# Patient Record
Sex: Male | Born: 1945
Health system: Southern US, Community
[De-identification: ages and names within clinical notes are randomized; demographics above are authoritative.]

## PROBLEM LIST (undated history)

## (undated) DIAGNOSIS — E119 Type 2 diabetes mellitus without complications: Secondary | ICD-10-CM

## (undated) DIAGNOSIS — I10 Essential (primary) hypertension: Secondary | ICD-10-CM

## (undated) DIAGNOSIS — E785 Hyperlipidemia, unspecified: Secondary | ICD-10-CM

## (undated) DIAGNOSIS — I509 Heart failure, unspecified: Secondary | ICD-10-CM

## (undated) DIAGNOSIS — R0602 Shortness of breath: Secondary | ICD-10-CM

## (undated) DIAGNOSIS — I2510 Atherosclerotic heart disease of native coronary artery without angina pectoris: Secondary | ICD-10-CM

## (undated) DIAGNOSIS — D1803 Hemangioma of intra-abdominal structures: Secondary | ICD-10-CM

## (undated) DIAGNOSIS — I255 Ischemic cardiomyopathy: Secondary | ICD-10-CM

## (undated) DIAGNOSIS — K859 Acute pancreatitis without necrosis or infection, unspecified: Secondary | ICD-10-CM

## (undated) DIAGNOSIS — I5022 Chronic systolic (congestive) heart failure: Secondary | ICD-10-CM

## (undated) DIAGNOSIS — I214 Non-ST elevation (NSTEMI) myocardial infarction: Secondary | ICD-10-CM

## (undated) DIAGNOSIS — K219 Gastro-esophageal reflux disease without esophagitis: Secondary | ICD-10-CM

## (undated) DIAGNOSIS — K769 Liver disease, unspecified: Secondary | ICD-10-CM

## (undated) HISTORY — DX: Chronic systolic (congestive) heart failure: I50.22

## (undated) HISTORY — DX: Ischemic cardiomyopathy: I25.5

## (undated) HISTORY — DX: Acute pancreatitis without necrosis or infection, unspecified: K85.90

## (undated) HISTORY — DX: Hemangioma of intra-abdominal structures: D18.03

## (undated) HISTORY — DX: Liver disease, unspecified: K76.9

## (undated) HISTORY — DX: Gastro-esophageal reflux disease without esophagitis: K21.9

## (undated) HISTORY — DX: Hyperlipidemia, unspecified: E78.5

## (undated) HISTORY — DX: Essential (primary) hypertension: I10

---

## 1969-05-28 HISTORY — PX: INCISION AND DRAINAGE ABSCESS ANAL: SUR669

## 1998-02-03 ENCOUNTER — Ambulatory Visit (HOSPITAL_COMMUNITY): Admission: RE | Admit: 1998-02-03 | Discharge: 1998-02-03 | Payer: Self-pay | Admitting: *Deleted

## 1999-05-29 DIAGNOSIS — K859 Acute pancreatitis without necrosis or infection, unspecified: Secondary | ICD-10-CM

## 1999-05-29 HISTORY — DX: Acute pancreatitis without necrosis or infection, unspecified: K85.90

## 2000-05-10 ENCOUNTER — Ambulatory Visit (HOSPITAL_COMMUNITY): Admission: RE | Admit: 2000-05-10 | Discharge: 2000-05-10 | Payer: Self-pay | Admitting: *Deleted

## 2003-09-26 ENCOUNTER — Inpatient Hospital Stay (HOSPITAL_COMMUNITY): Admission: EM | Admit: 2003-09-26 | Discharge: 2003-09-30 | Payer: Self-pay | Admitting: Emergency Medicine

## 2003-10-07 ENCOUNTER — Encounter: Admission: RE | Admit: 2003-10-07 | Discharge: 2004-01-05 | Payer: Self-pay | Admitting: Family Medicine

## 2004-10-30 ENCOUNTER — Ambulatory Visit: Payer: Self-pay | Admitting: Family Medicine

## 2004-11-06 ENCOUNTER — Ambulatory Visit: Payer: Self-pay | Admitting: Family Medicine

## 2004-11-12 ENCOUNTER — Ambulatory Visit: Payer: Self-pay | Admitting: Family Medicine

## 2004-11-18 ENCOUNTER — Ambulatory Visit: Payer: Self-pay | Admitting: Family Medicine

## 2004-12-03 ENCOUNTER — Ambulatory Visit: Payer: Self-pay | Admitting: Family Medicine

## 2005-01-29 ENCOUNTER — Ambulatory Visit: Payer: Self-pay | Admitting: Family Medicine

## 2005-05-04 ENCOUNTER — Ambulatory Visit: Payer: Self-pay | Admitting: Family Medicine

## 2005-07-16 ENCOUNTER — Emergency Department (HOSPITAL_COMMUNITY): Admission: EM | Admit: 2005-07-16 | Discharge: 2005-07-16 | Payer: Self-pay | Admitting: Emergency Medicine

## 2005-08-04 ENCOUNTER — Ambulatory Visit: Payer: Self-pay | Admitting: Family Medicine

## 2005-09-01 ENCOUNTER — Ambulatory Visit: Payer: Self-pay | Admitting: Family Medicine

## 2005-09-21 ENCOUNTER — Ambulatory Visit: Payer: Self-pay | Admitting: Endocrinology

## 2005-09-30 ENCOUNTER — Ambulatory Visit: Payer: Self-pay | Admitting: Endocrinology

## 2005-10-14 ENCOUNTER — Ambulatory Visit: Payer: Self-pay | Admitting: Endocrinology

## 2005-10-28 ENCOUNTER — Ambulatory Visit: Payer: Self-pay | Admitting: Endocrinology

## 2005-12-06 ENCOUNTER — Ambulatory Visit: Payer: Self-pay | Admitting: Endocrinology

## 2005-12-20 ENCOUNTER — Ambulatory Visit: Payer: Self-pay | Admitting: Endocrinology

## 2006-01-10 ENCOUNTER — Ambulatory Visit: Payer: Self-pay | Admitting: Endocrinology

## 2006-02-09 ENCOUNTER — Ambulatory Visit: Payer: Self-pay | Admitting: Endocrinology

## 2006-12-23 ENCOUNTER — Ambulatory Visit: Payer: Self-pay | Admitting: Endocrinology

## 2007-01-13 ENCOUNTER — Ambulatory Visit: Payer: Self-pay | Admitting: Endocrinology

## 2007-02-10 ENCOUNTER — Ambulatory Visit: Payer: Self-pay | Admitting: Endocrinology

## 2007-04-24 ENCOUNTER — Encounter: Payer: Self-pay | Admitting: Endocrinology

## 2007-04-24 DIAGNOSIS — I10 Essential (primary) hypertension: Secondary | ICD-10-CM | POA: Insufficient documentation

## 2007-04-24 DIAGNOSIS — E785 Hyperlipidemia, unspecified: Secondary | ICD-10-CM | POA: Insufficient documentation

## 2007-04-24 DIAGNOSIS — Z8719 Personal history of other diseases of the digestive system: Secondary | ICD-10-CM | POA: Insufficient documentation

## 2008-12-11 ENCOUNTER — Telehealth: Payer: Self-pay | Admitting: Family Medicine

## 2009-01-27 ENCOUNTER — Ambulatory Visit: Payer: Self-pay | Admitting: Endocrinology

## 2009-01-27 LAB — CONVERTED CEMR LAB
ALT: 23 units/L (ref 0–53)
AST: 20 units/L (ref 0–37)
Albumin: 3.8 g/dL (ref 3.5–5.2)
Alkaline Phosphatase: 55 units/L (ref 39–117)
BUN: 11 mg/dL (ref 6–23)
Bilirubin, Direct: 0.2 mg/dL (ref 0.0–0.3)
CO2: 30 meq/L (ref 19–32)
Calcium: 9.4 mg/dL (ref 8.4–10.5)
Chloride: 103 meq/L (ref 96–112)
Creatinine, Ser: 0.7 mg/dL (ref 0.4–1.5)
GFR calc non Af Amer: 122.11 mL/min (ref >60)
Glucose, Bld: 160 mg/dL — ABNORMAL HIGH (ref 70–99)
Potassium: 3.7 meq/L (ref 3.5–5.1)
Sodium: 140 meq/L (ref 135–145)
TSH: 1.33 microintl units/mL (ref 0.35–5.50)
Total Bilirubin: 0.9 mg/dL (ref 0.3–1.2)
Total Protein: 6.9 g/dL (ref 6.0–8.3)

## 2009-02-13 ENCOUNTER — Ambulatory Visit: Payer: Self-pay | Admitting: Endocrinology

## 2009-03-21 ENCOUNTER — Ambulatory Visit: Payer: Self-pay | Admitting: Endocrinology

## 2009-03-21 LAB — CONVERTED CEMR LAB: Hgb A1c MFr Bld: 11.6 % — ABNORMAL HIGH (ref 4.6–6.5)

## 2009-06-27 ENCOUNTER — Ambulatory Visit: Payer: Self-pay | Admitting: Endocrinology

## 2009-07-01 ENCOUNTER — Encounter (INDEPENDENT_AMBULATORY_CARE_PROVIDER_SITE_OTHER): Payer: Self-pay | Admitting: *Deleted

## 2009-07-03 LAB — CONVERTED CEMR LAB: Hgb A1c MFr Bld: 11.7 % — ABNORMAL HIGH (ref 4.6–6.5)

## 2009-07-11 ENCOUNTER — Ambulatory Visit: Payer: Self-pay | Admitting: Endocrinology

## 2009-08-01 ENCOUNTER — Ambulatory Visit: Payer: Self-pay | Admitting: Endocrinology

## 2009-09-12 ENCOUNTER — Ambulatory Visit: Payer: Self-pay | Admitting: Endocrinology

## 2009-09-27 HISTORY — PX: CARDIAC CATHETERIZATION: SHX172

## 2009-11-21 ENCOUNTER — Inpatient Hospital Stay (HOSPITAL_COMMUNITY): Admission: EM | Admit: 2009-11-21 | Discharge: 2009-11-22 | Payer: Self-pay | Admitting: Emergency Medicine

## 2009-11-21 DIAGNOSIS — I214 Non-ST elevation (NSTEMI) myocardial infarction: Secondary | ICD-10-CM

## 2009-11-21 HISTORY — DX: Non-ST elevation (NSTEMI) myocardial infarction: I21.4

## 2009-11-27 ENCOUNTER — Encounter: Payer: Self-pay | Admitting: Endocrinology

## 2009-12-12 ENCOUNTER — Ambulatory Visit: Payer: Self-pay | Admitting: Endocrinology

## 2009-12-12 DIAGNOSIS — F419 Anxiety disorder, unspecified: Secondary | ICD-10-CM | POA: Insufficient documentation

## 2009-12-12 LAB — CONVERTED CEMR LAB: Hgb A1c MFr Bld: 10.9 % — ABNORMAL HIGH (ref 4.6–6.5)

## 2009-12-29 ENCOUNTER — Telehealth: Payer: Self-pay | Admitting: Endocrinology

## 2010-01-23 ENCOUNTER — Ambulatory Visit: Payer: Self-pay | Admitting: Endocrinology

## 2010-02-13 ENCOUNTER — Ambulatory Visit: Payer: Self-pay | Admitting: Endocrinology

## 2010-03-11 ENCOUNTER — Telehealth: Payer: Self-pay | Admitting: Endocrinology

## 2010-03-20 ENCOUNTER — Ambulatory Visit: Payer: Self-pay | Admitting: Endocrinology

## 2010-03-20 LAB — CONVERTED CEMR LAB: Hgb A1c MFr Bld: 10.9 % — ABNORMAL HIGH (ref 4.6–6.5)

## 2010-05-22 ENCOUNTER — Ambulatory Visit: Payer: Self-pay | Admitting: Endocrinology

## 2010-08-03 ENCOUNTER — Ambulatory Visit: Payer: Self-pay | Admitting: Family Medicine

## 2010-08-03 LAB — CONVERTED CEMR LAB
Blood in Urine, dipstick: NEGATIVE
Specific Gravity, Urine: >=1.03
Urobilinogen, UA: 0.2

## 2010-08-06 LAB — CONVERTED CEMR LAB
BUN: 21 mg/dL (ref 6–23)
Basophils Absolute: 0 10*3/uL (ref 0.0–0.1)
Basophils Relative: 0.4 % (ref 0.0–3.0)
Bilirubin, Direct: 0.2 mg/dL (ref 0.0–0.3)
CO2: 29 meq/L (ref 19–32)
Chloride: 99 meq/L (ref 96–112)
Cholesterol: 114 mg/dL (ref 0–200)
Creatinine, Ser: 0.8 mg/dL (ref 0.4–1.5)
Eosinophils Absolute: 0.1 10*3/uL (ref 0.0–0.7)
HDL: 28.5 mg/dL — ABNORMAL LOW (ref >39.00)
Hgb A1c MFr Bld: 10.9 % — ABNORMAL HIGH (ref 4.6–6.5)
MCHC: 34 g/dL (ref 30.0–36.0)
MCV: 94.2 fL (ref 78.0–100.0)
Microalb Creat Ratio: 5.1 mg/g (ref 0.0–30.0)
Microalb, Ur: 6.4 mg/dL — ABNORMAL HIGH (ref 0.0–1.9)
Monocytes Absolute: 0.6 10*3/uL (ref 0.1–1.0)
Neutrophils Relative %: 64.9 % (ref 43.0–77.0)
PSA: 0.41 ng/mL (ref 0.10–4.00)
Platelets: 220 10*3/uL (ref 150.0–400.0)
RDW: 13.3 % (ref 11.5–14.6)
Total Bilirubin: 1.4 mg/dL — ABNORMAL HIGH (ref 0.3–1.2)
Total Protein: 6.9 g/dL (ref 6.0–8.3)
Triglycerides: 90 mg/dL (ref 0.0–149.0)

## 2010-10-29 NOTE — Progress Notes (Signed)
Summary: Rx refill req  Phone Note Refill Request Message from:  Fax from Pharmacy on [facility] [address]   Refills Requested: Medication #1:  QUINAPRIL-HYDROCHLOROTHIAZIDE 20-12.5 MG TABS 1 qd   Dosage confirmed as above?Dosage Confirmed   Supply Requested: 3 months  Medication #2:  PLAVIX 75 MG TABS 1 tab daily   Dosage confirmed as above?Dosage Confirmed   Supply Requested: 3 months  Medication #3:  COREG 6.25 MG TABS 1 tab two times a day with meals   Dosage confirmed as above?Dosage Confirmed   Supply Requested: 3 months  Medication #4:  SIMVASTATIN 40 MG TABS 1 tab daily   Dosage confirmed as above?Dosage Confirmed   Supply Requested: 3 months Per pharmacy, Pt's Insurance now requires a 90 days supply on these medications.   Method Requested: Electronic Initial call taken by: Margaret Pyle, CMA,  March 11, 2010 9:17 AM    Prescriptions: PLAVIX 75 MG TABS (CLOPIDOGREL BISULFATE) 1 tab daily  #90 x 3   Entered by:   Margaret Pyle, CMA   Authorized by:   Minus Breeding MD   Signed by:   Margaret Pyle, CMA on 03/11/2010   Method used:   Electronically to        [facility] [phone]* (retail)       [address]       [place], [place]  [address]       Ph: [phone]       Fax: [phone]   RxID:   [id number] SIMVASTATIN 40 MG TABS (SIMVASTATIN) 1 tab daily  #90 x 3   Entered by:   Margaret Pyle, CMA   Authorized by:   Minus Breeding MD   Signed by:   Margaret Pyle, CMA on 03/11/2010   Method used:   Electronically to        [facility] [phone]* (retail)       [address]       [place], [place]  [address]       Ph: [phone]       Fax: [phone]   RxID:   [id number] COREG 6.25 MG TABS (CARVEDILOL) 1 tab two times a day with meals  #180 x 3   Entered by:   Margaret Pyle, CMA   Authorized by:   Minus Breeding MD   Signed by:   Margaret Pyle, CMA on 03/11/2010   Method used:   Electronically to        [facility] [phone]* (retail)       [address]       [place], [place]  [address]       Ph: [phone]       Fax: [phone]   RxID:   [id number] QUINAPRIL-HYDROCHLOROTHIAZIDE 20-12.5 MG TABS (QUINAPRIL-HYDROCHLOROTHIAZIDE) 1 qd  #90 x 3   Entered by:   Margaret Pyle, CMA   Authorized by:   Minus Breeding MD   Signed by:   Margaret Pyle, CMA on 03/11/2010   Method used:   Electronically to        [facility] [phone]* (retail)       [address]       [place], [place]  [address]       Ph: [phone]  Fax: [phone]   RxID:   [id number]

## 2010-10-29 NOTE — Miscellaneous (Signed)
Summary: [facility]   Imported By: Lester Wynona 12/19/2009 09:15:31  _____________________________________________________________________  External Attachment:    Type:   Image     Comment:   External Document

## 2010-10-29 NOTE — Assessment & Plan Note (Signed)
Summary: 2 wk f/u // # / cd   Vital Signs:  Patient profile:   65 year old male Height:      72 inches (182.88 cm) Weight:      211.50 pounds (96.14 kg) O2 Sat:      99 % on Room air Temp:     97.5 degrees F (36.39 degrees C) oral Pulse rate:   73 / minute BP sitting:   128 / 74  (left arm) Cuff size:   large  Vitals Entered By: Josph Macho RMA (Feb 13, 2010 3:02 PM)  O2 Flow:  Room air CC: 2 week follow up/ pt states he has to fluctuate his Humulog depending on his BS readings/ CF Is Patient Diabetic? Yes   CC:  2 week follow up/ pt states he has to fluctuate his Humulog depending on his BS readings/ CF.  History of Present Illness: no cbg record, but states cbg's are improved overall.  it was twice low before lunch, on a saturday.  he says on weekdays and weekends, it is higher in am than pm.  pt states he feels well in general.    Current Medications (verified): 1)  Multivitamins   Tabs (Multiple Vitamin) .... Take 1 By Mouth Qd 2)  Actos 45 Mg  Tabs (Pioglitazone Hcl) .... Take 1 By Mouth Qd 3)  Cod Liver Oil   Caps (Cod Liver Oil) .... Take 1 By Mouth Qd 4)  Pravachol 40 Mg  Tabs (Pravastatin Sodium) .... Qhs 5)  Bd U/f Original Pen Needle 29g X 12.42mm  Misc (Insulin Pen Needle) .... Qid 6)  Accu-Chek Aviva  Strp (Glucose Blood) .... Two Times A Day 250.01, and Lancets 7)  Quinapril-Hydrochlorothiazide 20-12.5 Mg Tabs (Quinapril-Hydrochlorothiazide) .Marland Kitchen.. 1 Qd 8)  Plavix 75 Mg Tabs (Clopidogrel Bisulfate) .Marland Kitchen.. 1 Tab Daily 9)  Coreg 6.25 Mg Tabs (Carvedilol) .Marland Kitchen.. 1 Tab Two Times A Day With Meals 10)  Simvastatin 40 Mg Tabs (Simvastatin) .Marland Kitchen.. 1 Tab Daily 11)  Humalog Mix 75/25 Kwikpen 75-25 % Susp (Insulin Lispro Prot & Lispro) .... 40 Units Am and 30 Units Pm, and Pen Needles Two Times A Day 12)  Aspirin 81mg  .... Once Daily  Allergies (verified): 1)  ! Metformin Hcl (Metformin Hcl)  Past History:  Past Medical History: Last updated: 04/24/2007 Diabetes mellitus,  type II Hyperlipidemia Hypertension Pancreatitis, hx of  Review of Systems  The patient denies syncope.    Physical Exam  General:  normal appearance.   Pulses:  dorsalis pedis intact bilat.   Extremities:  no deformity.  no ulcer on the feet.  feet are of normal color and temp.  no edema  Neurologic:  sensation is intact to touch on the feet    Impression & Recommendations:  Problem # 1:  DIABETES MELLITUS, TYPE II (ICD-250.00) he needs some adjustment in his therapy  Medications Added to Medication List This Visit: 1)  Humalog Mix 75/25 Kwikpen 75-25 % Susp (Insulin lispro prot & lispro) .... 40 units am and 30 units pm, and pen needles two times a day 2)  Aspirin 81mg   .... Once daily  Other Orders: Est. Patient Level III ([address])  Patient Instructions: 1)  change levemir to humalog 75/25, 40 units am and 30 units pm. 2)  on weekends, take 45 units am and 35 units pm 3)  check your blood sugar 3 times a day.  vary the time of day when you check, between before the 3 meals, and at bedtime.  also check if you have symptoms of your blood sugar being too high or too low.  please keep a record of the readings and bring it to your next appointment here.  please call us sooner if you are having low blood sugar episodes. 4)  Please schedule a follow-up appointment in 1 month.

## 2010-10-29 NOTE — Assessment & Plan Note (Signed)
Summary: 2 MTH FU  STC   Vital Signs:  Patient profile:   65 year old male Height:      72 inches (182.88 cm) Weight:      203.13 pounds (92.33 kg) O2 Sat:      97 % on Room air Temp:     98.3 degrees F (36.83 degrees C) oral Pulse rate:   79 / minute BP sitting:   142 / 90  (left arm) Cuff size:   large  Vitals Entered By: Josph Macho RMA (December 12, 2009 1:34 PM)  O2 Flow:  Room air CC: 2 month follow up/ pt is no longer taking Pravachol/ CF Is Patient Diabetic? Yes   CC:  2 month follow up/ pt is no longer taking Pravachol/ CF.  History of Present Illness: the status of 3 chronic medical problems is addressed today: dm:  he feels better since recent hospitalization.  it is over 200, especially on the days when he does not work.  however, it is extremely variable.  he had hypoglycemia in the afternoon, when he had taken only 15 units that am.  on further questioning, he says he is taking lantus at a varying dosage.  he says insulin dosage requirement has decreased drastically since his hospitalization last month.   anxiety persists. htn:  he is taking both quinapril and lisinopril given to him in the hospital.  Current Medications (verified): 1)  Multivitamins   Tabs (Multiple Vitamin) .... Take 1 By Mouth Qd 2)  Actos 45 Mg  Tabs (Pioglitazone Hcl) .... Take 1 By Mouth Qd 3)  Cod Liver Oil   Caps (Cod Liver Oil) .... Take 1 By Mouth Qd 4)  Pravachol 40 Mg  Tabs (Pravastatin Sodium) .... Qhs 5)  Bd U/f Original Pen Needle 29g X 12.85mm  Misc (Insulin Pen Needle) .... Qid 6)  Accu-Chek Aviva  Strp (Glucose Blood) .... Two Times A Day 250.01, and Lancets 7)  Humalog Mix 75/25 Kwikpen 75-25 % Susp (Insulin Lispro Prot & Lispro) .... 30 Units Bid 8)  Quinapril-Hydrochlorothiazide 20-12.5 Mg Tabs (Quinapril-Hydrochlorothiazide) .Marland Kitchen.. 1 Qd 9)  Plavix 75 Mg Tabs (Clopidogrel Bisulfate) .Marland Kitchen.. 1 Tab Daily 10)  Coreg 6.25 Mg Tabs (Carvedilol) .Marland Kitchen.. 1 Tab Two Times A Day With Meals 11)   Simvastatin 40 Mg Tabs (Simvastatin) .Marland Kitchen.. 1 Tab Daily  Allergies (verified): 1)  ! Metformin Hcl (Metformin Hcl)  Past History:  Past Medical History: Last updated: 04/24/2007 Diabetes mellitus, type II Hyperlipidemia Hypertension Pancreatitis, hx of  Review of Systems  The patient denies hypoglycemia.         he has lost a few lbs since last ov.  Physical Exam  General:  normal appearance.   Psych:  very anxious and talkative. Additional Exam:  Hemoglobin A1C       [H]  10.9 %    Impression & Recommendations:  Problem # 1:  DIABETES MELLITUS, TYPE II (ICD-250.00) he may do better on a simpler regimen  Problem # 2:  ANXIETY (ICD-300.00) this may be limiting the rx of #1  Problem # 3:  HYPERTENSION (ICD-401.9) he is on redundant meds  Medications Added to Medication List This Visit: 1)  Plavix 75 Mg Tabs (Clopidogrel bisulfate) .Marland Kitchen.. 1 tab daily 2)  Coreg 6.25 Mg Tabs (Carvedilol) .Marland Kitchen.. 1 tab two times a day with meals 3)  Simvastatin 40 Mg Tabs (Simvastatin) .Marland Kitchen.. 1 tab daily 4)  Levemir Flexpen 100 Unit/ml Soln (Insulin detemir) .... 40 units each am,  and pen needles for use once daily  Other Orders: TLB-A1C / Hgb A1C (Glycohemoglobin) (83036-A1C) Est. Patient Level IV ([address])  Patient Instructions: 1)  change current insulin to levemir, 40 units each am 2)  check your blood sugar 3 times a day.  vary the time of day when you check, between before the 3 meals, and at bedtime.  also check if you have symptoms of your blood sugar being too high or too low.  please keep a record of the readings and bring it to your next appointment here.  please call us sooner if you are having low blood sugar episodes. 3)  Please schedule a follow-up appointment in 2 weeks. 4)  please see dr todd for treatment of anxiety. 5)  you do not need both quinapril and lisinopril.  just continue the quinapril/hct as you have been taking. Prescriptions: LEVEMIR FLEXPEN 100 UNIT/ML SOLN  (INSULIN DETEMIR) 40 units each am, and pen needles for use once daily  #1 box x 11   Entered and Authorized by:   Minus Breeding MD   Signed by:   Minus Breeding MD on 12/12/2009   Method used:   Electronically to        [facility]  [address] # [id number]* (retail)       [address]       [place], [place]  [address]       Ph: [phone] or [phone]       Fax: [phone]   RxID:   [id number]

## 2010-10-29 NOTE — Progress Notes (Signed)
  Phone Note Refill Request Message from:  Fax from Pharmacy on December 29, 2009 1:07 PM  Refills Requested: Medication #1:  ACTOS 45 MG  TABS take 1 by mouth qd   Dosage confirmed as above?Dosage Confirmed Pharmacy indicated that Pt needs an RX for 90 days due to insurance.  Initial call taken by: Josph Macho RMA,  December 29, 2009 1:08 PM    Prescriptions: ACTOS 45 MG  TABS (PIOGLITAZONE HCL) take 1 by mouth qd  #90 x 1   Entered by:   Josph Macho RMA   Authorized by:   Minus Breeding MD   Signed by:   Josph Macho RMA on 12/29/2009   Method used:   [facility]ronically to        [facility] [phone]* (retail)       [address]       [place], [place]  [address]       Ph: [phone]       Fax: [phone]   RxID:   [id number]

## 2010-10-29 NOTE — Assessment & Plan Note (Signed)
Summary: 2 wk fu  stc  RS'D PER PT/ HASN'T GOTTEN THE MEDICINE/NWS   Vital Signs:  Patient profile:   65 year old male Height:      72 inches (182.88 cm) Weight:      203.25 pounds (92.39 kg) BMI:     27.67 O2 Sat:      97 % on Room air Temp:     98.0 degrees F (36.67 degrees C) oral Pulse rate:   71 / minute BP sitting:   130 / 78  (left arm) Cuff size:   large  Vitals Entered By: Josph Macho RMA (January 23, 2010 1:15 PM)  O2 Flow:  Room air CC: 2 week follow up/ Levemir makes pt sick went back to Lantus/ CF Is Patient Diabetic? Yes   CC:  2 week follow up/ Levemir makes pt sick went back to Lantus/ CF.  History of Present Illness: on levemir, pt had cbg's 250-400.  he took humalog 75/25, a total of 60 units/day, cbg's are still in the 200's.  he wants to go ack to the humalog 75/25.  Current Medications (verified): 1)  Multivitamins   Tabs (Multiple Vitamin) .... Take 1 By Mouth Qd 2)  Actos 45 Mg  Tabs (Pioglitazone Hcl) .... Take 1 By Mouth Qd 3)  Cod Liver Oil   Caps (Cod Liver Oil) .... Take 1 By Mouth Qd 4)  Pravachol 40 Mg  Tabs (Pravastatin Sodium) .... Qhs 5)  Bd U/f Original Pen Needle 29g X 12.84mm  Misc (Insulin Pen Needle) .... Qid 6)  Accu-Chek Aviva  Strp (Glucose Blood) .... Two Times A Day 250.01, and Lancets 7)  Quinapril-Hydrochlorothiazide 20-12.5 Mg Tabs (Quinapril-Hydrochlorothiazide) .Marland Kitchen.. 1 Qd 8)  Plavix 75 Mg Tabs (Clopidogrel Bisulfate) .Marland Kitchen.. 1 Tab Daily 9)  Coreg 6.25 Mg Tabs (Carvedilol) .Marland Kitchen.. 1 Tab Two Times A Day With Meals 10)  Simvastatin 40 Mg Tabs (Simvastatin) .Marland Kitchen.. 1 Tab Daily 11)  Levemir Flexpen 100 Unit/ml Soln (Insulin Detemir) .... 40 Units Each Am, and Pen Needles For Use Once Daily  Allergies (verified): 1)  ! Metformin Hcl (Metformin Hcl)  Past History:  Past Medical History: Last updated: 04/24/2007 Diabetes mellitus, type II Hyperlipidemia Hypertension Pancreatitis, hx of  Review of Systems  The patient denies  hypoglycemia.    Physical Exam  General:  normal appearance.   Psych:  anxious.     Impression & Recommendations:  Problem # 1:  DIABETES MELLITUS, TYPE II (ICD-250.00) whatever insulin he takes, he seems to need at least 60, and probably 70 units total per day.  Medications Added to Medication List This Visit: 1)  Humalog Mix 75/25 Kwikpen 75-25 % Susp (Insulin lispro prot & lispro) .... 40 units am and 30 units pm, and pen needles two times a day  Other Orders: Est. Patient Level III ([address])  Patient Instructions: 1)  change levemir to humalog 75/25, 40 units am and 30 units pm. 2)  on weekends, take 50 units am and 30 units pm 3)  check your blood sugar 3 times a day.  vary the time of day when you check, between before the 3 meals, and at bedtime.  also check if you have symptoms of your blood sugar being too high or too low.  please keep a record of the readings and bring it to your next appointment here.  please call us sooner if you are having low blood sugar episodes. 4)  Please schedule a follow-up appointment in 2 weeks. Prescriptions:  HUMALOG MIX 75/25 KWIKPEN 75-25 % SUSP (INSULIN LISPRO PROT & LISPRO) 40 units am and 30 units pm, and pen needles two times a day  #2 boxes x 11   Entered and Authorized by:   Minus Breeding MD   Signed by:   Minus Breeding MD on 01/23/2010   Method used:   [facility] to        [facility] [phone]* (retail)       [address]       [place], [place]  [address]       Ph: [phone]       Fax: [phone]   RxID:   [id number]

## 2010-10-29 NOTE — Progress Notes (Signed)
  Phone Note Refill Request Message from:  Fax from Pharmacy on December 29, 2009 9:00 AM  Refills Requested: Medication #1:  MULTIVITAMINS   TABS take 1 by mouth qd   Dosage confirmed as above?Dosage Confirmed  Medication #2:  BD U/F ORIGINAL PEN NEEDLE 29G X 12.7MM  MISC qid   Dosage confirmed as above?Dosage Confirmed  Medication #3:  ACTOS 45 MG  TABS take 1 by mouth qd   Dosage confirmed as above?Dosage Confirmed  Medication #4:  ACCU-CHEK AVIVA  STRP two times a day 250.01   Dosage confirmed as above?Dosage Confirmed [facility]  Initial call taken by: Josph Macho RMA,  December 29, 2009 9:01 AM    Prescriptions: ACCU-CHEK AVIVA  STRP (GLUCOSE BLOOD) two times a day 250.01, and lancets  #100 x 5   Entered by:   Josph Macho RMA   Authorized by:   Minus Breeding MD   Signed by:   Josph Macho RMA on 12/29/2009   Method used:   [facility]ronically to        [facility] [phone]* (retail)       [address]       [place], [place]  [address]       Ph: [phone]       Fax: [phone]   RxID:   [id number] BD U/F ORIGINAL PEN NEEDLE 29G X 12.7MM  MISC (INSULIN PEN NEEDLE) qid  #120 x 5   Entered by:   Josph Macho RMA   Authorized by:   Minus Breeding MD   Signed by:   Josph Macho RMA on 12/29/2009   Method used:   Electronically to        [facility] [phone]* (retail)       [address]       [place], [place]  [address]       Ph: [phone]       Fax: [phone]   RxID:   [id number] ACTOS 45 MG  TABS (PIOGLITAZONE HCL) take 1 by mouth qd  #30 x 5   Entered by:   Josph Macho RMA   Authorized by:   Minus Breeding MD   Signed by:   Josph Macho RMA on 12/29/2009   Method used:   Electronically to        [facility] [phone]* (retail)       [address]       [place], [place]  [address]       Ph: [phone]       Fax: [phone]  RxID:   [id number] MULTIVITAMINS   TABS (MULTIPLE VITAMIN) take 1 by mouth qd  #30 x 5   Entered by:   Josph Macho RMA   Authorized by:   Minus Breeding MD   Signed by:   Josph Macho RMA on 12/29/2009   Method used:   Electronically to        [facility] [phone]* (retail)       [address]       [place], [place]  [address]       Ph: [phone]       Fax: [phone]   RxID:   [id number]

## 2010-10-29 NOTE — Assessment & Plan Note (Signed)
Summary: 1 MTH FU  STC   Vital Signs:  Patient profile:   65 year old male Height:      72 inches (182.88 cm) Weight:      204 pounds (92.73 kg) BMI:     27.77 O2 Sat:      97 % on Room air Temp:     97.3 degrees F (36.28 degrees C) oral Pulse rate:   79 / minute BP sitting:   156 / 96  (left arm) Cuff size:   large  Vitals Entered By: Brenton Grills MA (March 20, 2010 3:59 PM)  O2 Flow:  Room air CC: 1 mo f/u/pt wants to discuss possible reactions to medications/aj   CC:  1 mo f/u/pt wants to discuss possible reactions to medications/aj.  History of Present Illness: the status of at least 3 ongoing medical problems is addressed today: dm:  no cbg record, but states cbg's are well-controlled.  no hypoglycemic sxs or low cbg's. polyuria:  pt says this is caused by actos.   htn:  pt says today's bp elevation is situational.    Current Medications (verified): 1)  Multivitamins   Tabs (Multiple Vitamin) .... Take 1 By Mouth Qd 2)  Actos 45 Mg  Tabs (Pioglitazone Hcl) .... Take 1 By Mouth Qd 3)  Cod Liver Oil   Caps (Cod Liver Oil) .... Take 1 By Mouth Qd 4)  Pravachol 40 Mg  Tabs (Pravastatin Sodium) .... Qhs 5)  Bd U/f Original Pen Needle 29g X 12.18mm  Misc (Insulin Pen Needle) .... Qid 6)  Accu-Chek Aviva  Strp (Glucose Blood) .... Two Times A Day 250.01, and Lancets 7)  Quinapril-Hydrochlorothiazide 20-12.5 Mg Tabs (Quinapril-Hydrochlorothiazide) .Marland Kitchen.. 1 Qd 8)  Plavix 75 Mg Tabs (Clopidogrel Bisulfate) .Marland Kitchen.. 1 Tab Daily 9)  Coreg 6.25 Mg Tabs (Carvedilol) .Marland Kitchen.. 1 Tab Two Times A Day With Meals 10)  Simvastatin 40 Mg Tabs (Simvastatin) .Marland Kitchen.. 1 Tab Daily 11)  Humalog Mix 75/25 Kwikpen 75-25 % Susp (Insulin Lispro Prot & Lispro) .... 40 Units Am and 30 Units Pm, and Pen Needles Two Times A Day 12)  Aspirin 81mg  .... Once Daily  Allergies (verified): 1)  ! Metformin Hcl (Metformin Hcl)  Past History:  Past Medical History: Last updated: 04/24/2007 Diabetes mellitus, type  II Hyperlipidemia Hypertension Pancreatitis, hx of  Review of Systems  The patient denies weight loss and weight gain.         he has anxiety  Physical Exam  General:  normal appearance.   Skin:  insulin injection sites at anterior abdomen are normal Psych:  anxious.   Additional Exam:  Hemoglobin A1C       [H]  10.9 %    Impression & Recommendations:  Problem # 1:  DIABETES MELLITUS, TYPE II (ICD-250.00)  Problem # 2:  polyuria uncertain etiology  Problem # 3:  HYPERTENSION (ICD-401.9) apparently with a situational component  Other Orders: TLB-A1C / Hgb A1C (Glycohemoglobin) (83036-A1C) Est. Patient Level III ([address])  Patient Instructions: 1)  good diet and exercise habits significanly improve the control of your diabetes.  please let me know if you wish to be referred to a dietician.  high blood sugar is very risky to your health.  you should see an eye doctor every year. 2)  controlling your blood pressure and cholesterol drastically reduces the damage diabetes does to your body.  this also applies to quitting smoking.  please discuss these with your doctor.  you should take an aspirin  every day, unless you have been advised by a doctor not to. 3)  continue humalog 75/25, 40 units am and 30 units pm. 4)  on weekends, take 45 units am and 35 units pm 5)  check your blood sugar 3 times a day.  vary the time of day when you check, between before the 3 meals, and at bedtime.  also check if you have symptoms of your blood sugar being too high or too low.  please keep a record of the readings and bring it to your next appointment here.  please call us sooner if you are having low blood sugar episodes. 6)  Please schedule a follow-up appointment in 1 month. 7)  stop actos.   8)  you are due for a physical with dr todd 9)  a blood test are being ordered for you today.  please call [phone] to hear your test results. 10)  (update: i left message on phone-tree:  rx as we  discussed)

## 2010-10-29 NOTE — Assessment & Plan Note (Signed)
Summary: 1 MTH FU---STC   Vital Signs:  Patient profile:   65 year old male Height:      72 inches (182.88 cm) Weight:      201.25 pounds (91.48 kg) BMI:     27.39 O2 Sat:      97 % on Room air Temp:     97.7 degrees F (36.50 degrees C) oral Pulse rate:   77 / minute BP sitting:   152 / 92  (left arm) Cuff size:   large  Vitals Entered By: Brenton Grills MA (May 22, 2010 4:21 PM)  O2 Flow:  Room air  History of Present Illness: no cbg record, but states cbg's continue to be extremely variable.  he continues to have trouble coordinating his insulin with meals.  he has hypoglycemia when he takes insulin, but a meal is delayed.    Current Medications (verified): 1)  Multivitamins   Tabs (Multiple Vitamin) .... Take 1 By Mouth Qd 2)  Cod Liver Oil   Caps (Cod Liver Oil) .... Take 1 By Mouth Qd 3)  Pravachol 40 Mg  Tabs (Pravastatin Sodium) .... Qhs 4)  Bd U/f Original Pen Needle 29g X 12.35mm  Misc (Insulin Pen Needle) .... Qid 5)  Accu-Chek Aviva  Strp (Glucose Blood) .... Two Times A Day 250.01, and Lancets 6)  Quinapril-Hydrochlorothiazide 20-12.5 Mg Tabs (Quinapril-Hydrochlorothiazide) .Marland Kitchen.. 1 Qd 7)  Plavix 75 Mg Tabs (Clopidogrel Bisulfate) .Marland Kitchen.. 1 Tab Daily 8)  Coreg 6.25 Mg Tabs (Carvedilol) .Marland Kitchen.. 1 Tab Two Times A Day With Meals 9)  Simvastatin 40 Mg Tabs (Simvastatin) .Marland Kitchen.. 1 Tab Daily 10)  Humalog Mix 75/25 Kwikpen 75-25 % Susp (Insulin Lispro Prot & Lispro) .... 40 Units Am and 30 Units Pm, and Pen Needles Two Times A Day 11)  Aspirin 81mg  .... Once Daily  Allergies (verified): 1)  ! Metformin Hcl (Metformin Hcl)  Past History:  Past Medical History: Last updated: 04/24/2007 Diabetes mellitus, type II Hyperlipidemia Hypertension Pancreatitis, hx of  Review of Systems  The patient denies syncope.    Physical Exam  General:  normal appearance.   Neck:  Supple without thyroid enlargement or tenderness.  Psych:  anxious.     Impression &  Recommendations:  Problem # 1:  DIABETES MELLITUS, TYPE II (ICD-250.00) he needs a simpler regimen  Medications Added to Medication List This Visit: 1)  Levemir Flexpen 100 Unit/ml Soln (Insulin detemir) .... 60 units each am, and pen needles once daily  Other Orders: Est. Patient Level III (91478)  Patient Instructions: 1)  after you finish your current insulin (few days), change to levemir, 60 units each am.   2)  check your blood sugar 3 times a day.  vary the time of day when you check, between before the 3 meals, and at bedtime.  also check if you have symptoms of your blood sugar being too high or too low.  please keep a record of the readings and bring it to your next appointment here.  please call us sooner if you are having low blood sugar episodes. 3)  Please schedule a follow-up appointment in 1-2 weeks. 4)  please make a physical appointment with dr todd. Prescriptions: LEVEMIR FLEXPEN 100 UNIT/ML SOLN (INSULIN DETEMIR) 60 units each am, and pen needles once daily  #2 boxes x 11   Entered and Authorized by:   Minus Breeding MD   Signed by:   Minus Breeding MD on 05/22/2010   Method used:  [facility]        [facility] [phone]* (retail)       [address]       [place], [place]  [address]       Ph: [phone]       Fax: [phone]   RxID:   [id number]

## 2010-10-29 NOTE — Letter (Signed)
Summary: [facility]  [facility]   Imported By: Lester Kiowa 12/04/2009 13:00:25  _____________________________________________________________________  External Attachment:    Type:   Image     Comment:   External Document

## 2010-10-29 NOTE — Assessment & Plan Note (Signed)
Summary: CPX (PT WILL COME IN FASTING) // RS   Vital Signs:  Patient profile:   65 year old male Height:      72 inches Weight:      196 pounds BMI:     26.68 Temp:     98.6 degrees F oral BP sitting:   125 / 80  (left arm)  Vitals Entered By: Kern Reap CMA Duncan Dull) (August 03, 2010 9:31 AM)  CC: cpx Is Patient Diabetic? Yes   CC:  cpx.  History of Present Illness: David Lane is a 65 year old, married male, nonsmoker, who comes in today for general physical examination with a history of underlying type I diabetes, hyperlipidemia, coronary artery disease, hypertension.  He states he was seen by the cardiologist recently because of a broken vein in his heart????????he had an angiogram by Barnet Pall.  I do not have a record of what happened.  He does not recall if he has a stent placed however, he is on Plavix.  Allergies: 1)  ! Metformin Hcl (Metformin Hcl)  Past History:  Past medical, surgical, family and social histories (including risk factors) reviewed, and no changes noted (except as noted below).  Past Medical History: Reviewed history from 04/24/2007 and no changes required. Diabetes mellitus, type II Hyperlipidemia Hypertension Pancreatitis, hx of  Family History: Reviewed history and no changes required.  Social History: Reviewed history from 01/27/2009 and no changes required. works Technical brewer single  Review of Systems      See HPI  Physical Exam  General:  Well-developed,well-nourished,in no acute distress; alert,appropriate and cooperative throughout examination Head:  Normocephalic and atraumatic without obvious abnormalities. No apparent alopecia or balding. Eyes:  No corneal or conjunctival inflammation noted. EOMI. Perrla. Funduscopic exam benign, without hemorrhages, exudates or papilledema. Vision grossly normal. Ears:  External ear exam shows no significant lesions or deformities.  Otoscopic examination reveals clear canals, tympanic  membranes are intact bilaterally without bulging, retraction, inflammation or discharge. Hearing is grossly normal bilaterally. Nose:  External nasal examination shows no deformity or inflammation. Nasal mucosa are pink and moist without lesions or exudates. Mouth:  Oral mucosa and oropharynx without lesions or exudates.  Teeth in good repair. Neck:  No deformities, masses, or tenderness noted. Chest Wall:  No deformities, masses, tenderness or gynecomastia noted. Breasts:  No masses or gynecomastia noted Lungs:  Normal respiratory effort, chest expands symmetrically. Lungs are clear to auscultation, no crackles or wheezes. Heart:  Normal rate and regular rhythm. S1 and S2 normal without gallop, murmur, click, rub or other extra sounds. Abdomen:  Bowel sounds positive,abdomen soft and non-tender without masses, organomegaly or hernias noted. Rectal:  No external abnormalities noted. Normal sphincter tone. No rectal masses or tenderness. Genitalia:  Testes bilaterally descended without nodularity, tenderness or masses. No scrotal masses or lesions. No penis lesions or urethral discharge. Prostate:  Prostate gland firm and smooth, no enlargement, nodularity, tenderness, mass, asymmetry or induration. Msk:  No deformity or scoliosis noted of thoracic or lumbar spine.   Pulses:  R and L carotid,radial,femoral,dorsalis pedis and posterior tibial pulses are full and equal bilaterally Extremities:  No clubbing, cyanosis, edema, or deformity noted with normal full range of motion of all joints.   Neurologic:  No cranial nerve deficits noted. Station and gait are normal. Plantar reflexes are down-going bilaterally. DTRs are symmetrical throughout. Sensory, motor and coordinative functions appear intact. Skin:  Intact without suspicious lesions or rashes Cervical Nodes:  No lymphadenopathy noted Axillary Nodes:  No  palpable lymphadenopathy Inguinal Nodes:  No significant adenopathy Psych:  Cognition and  judgment appear intact. Alert and cooperative with normal attention span and concentration. No apparent delusions, illusions, hallucinations  Diabetes Management Exam:    Foot Exam (with socks and/or shoes not present):       Sensory-Pinprick/Light touch:          Left medial foot (L-4): normal          Left dorsal foot (L-5): normal          Left lateral foot (S-1): normal          Right medial foot (L-4): normal          Right dorsal foot (L-5): normal          Right lateral foot (S-1): normal       Sensory-Monofilament:          Left foot: normal          Right foot: normal       Inspection:          Left foot: normal          Right foot: normal       Nails:          Left foot: normal          Right foot: normal    Eye Exam:       Eye Exam done here today          Results: normal   Impression & Recommendations:  Problem # 1:  HYPERTENSION (ICD-401.9) Assessment Improved  His updated medication list for this problem includes:    Quinapril-hydrochlorothiazide 20-12.5 Mg Tabs (Quinapril-hydrochlorothiazide) .Marland Kitchen... 1 qd    Coreg 6.25 Mg Tabs (Carvedilol) .Marland Kitchen... 1 tab two times a day with meals  Orders: Venipuncture (29562) TLB-Lipid Panel (80061-LIPID) TLB-BMP (Basic Metabolic Panel-BMET) (80048-METABOL) TLB-CBC Platelet - w/Differential (85025-CBCD) TLB-Hepatic/Liver Function Pnl (80076-HEPATIC) TLB-TSH (Thyroid Stimulating Hormone) (84443-TSH) TLB-A1C / Hgb A1C (Glycohemoglobin) (83036-A1C) TLB-PSA (Prostate Specific Antigen) (84153-PSA) TLB-Microalbumin/Creat Ratio, Urine (82043-MALB) Prescription Created Electronically (Z3086) UA Dipstick w/Micro (automated) (81001)  Problem # 2:  HYPERLIPIDEMIA (ICD-272.4) Assessment: Improved  The following medications were removed from the medication list:    Pravachol 40 Mg Tabs (Pravastatin sodium) ..... Qhs His updated medication list for this problem includes:    Simvastatin 40 Mg Tabs (Simvastatin) .Marland Kitchen... 1 tab  daily  Orders: Venipuncture (57846) TLB-Lipid Panel (80061-LIPID) TLB-BMP (Basic Metabolic Panel-BMET) (80048-METABOL) TLB-CBC Platelet - w/Differential (85025-CBCD) TLB-Hepatic/Liver Function Pnl (80076-HEPATIC) TLB-TSH (Thyroid Stimulating Hormone) (84443-TSH) TLB-A1C / Hgb A1C (Glycohemoglobin) (83036-A1C) TLB-PSA (Prostate Specific Antigen) (84153-PSA) TLB-Microalbumin/Creat Ratio, Urine (82043-MALB) Prescription Created Electronically (N6295) UA Dipstick w/Micro (automated) (81001)  Problem # 3:  DIABETES MELLITUS, TYPE II (ICD-250.00) Assessment: Improved  His updated medication list for this problem includes:    Quinapril-hydrochlorothiazide 20-12.5 Mg Tabs (Quinapril-hydrochlorothiazide) .Marland Kitchen... 1 qd    Levemir Flexpen 100 Unit/ml Soln (Insulin detemir) .Marland KitchenMarland KitchenMarland KitchenMarland Kitchen 60 units each am, and pen needles once daily  Orders: Venipuncture (28413) TLB-Lipid Panel (80061-LIPID) TLB-BMP (Basic Metabolic Panel-BMET) (80048-METABOL) TLB-CBC Platelet - w/Differential (85025-CBCD) TLB-Hepatic/Liver Function Pnl (80076-HEPATIC) TLB-TSH (Thyroid Stimulating Hormone) (84443-TSH) TLB-A1C / Hgb A1C (Glycohemoglobin) (83036-A1C) TLB-PSA (Prostate Specific Antigen) (84153-PSA) TLB-Microalbumin/Creat Ratio, Urine (82043-MALB) Prescription Created Electronically (K4401) UA Dipstick w/Micro (automated) (81001)  Problem # 4:  Preventive Health Care (ICD-V70.0) Assessment: Unchanged  Complete Medication List: 1)  Multivitamins Tabs (Multiple vitamin) .... Take 1 by mouth qd 2)  Bd U/f Original Pen Needle 29g X 12.55mm Misc (Insulin  pen needle) .... Qid 3)  Accu-chek Aviva Strp (Glucose blood) .... Four times a day 250.01, and lancets 4)  Quinapril-hydrochlorothiazide 20-12.5 Mg Tabs (Quinapril-hydrochlorothiazide) .Marland Kitchen.. 1 qd 5)  Plavix 75 Mg Tabs (Clopidogrel bisulfate) .Marland Kitchen.. 1 tab daily 6)  Coreg 6.25 Mg Tabs (Carvedilol) .Marland Kitchen.. 1 tab two times a day with meals 7)  Simvastatin 40 Mg Tabs (Simvastatin)  .Marland Kitchen.. 1 tab daily 8)  Aspirin 81mg   .... Once daily 9)  Levemir Flexpen 100 Unit/ml Soln (Insulin detemir) .... 60 units each am, and pen needles once daily 10)  Accu-chek Multiclix Lancets Misc (Lancets) .... Use as directed  Patient Instructions: 1)  See your eye doctor yearly to check for diabetic eye damage. 2)  Please schedule a follow-up appointment in 1 year. 3)  It is important that you exercise regularly at least 20 minutes 5 times a week. If you develop chest pain, have severe difficulty breathing, or feel very tired , stop exercising immediately and seek medical attention. 4)  Schedule a colonoscopy/sigmoidoscopy to help detect colon cancer. 5)  Take an Aspirin every day. 6)  Check your blood sugars regularly. If your readings are usually above : or below 70 you should contact our office. 7)  It is important that your Diabetic A1c level is checked every 3 months. 8)  See y  endocrinologist every 3 months 9)  See your eye doctor yearly to check for diabetic eye damage. 10)  Check your feet each night for sore areas, calluses or signs of infection. 11)  Check your Blood Pressure regularly. If it is above: you should make an appointment. Prescriptions: SIMVASTATIN 40 MG TABS (SIMVASTATIN) 1 tab daily  #100 x 3   Entered and Authorized by:   Roderick Pee MD   Signed by:   Roderick Pee MD on 08/03/2010   Method used:   [facility] to        [facility] [phone]* (retail)       [address]       [place], [place]  [address]       Ph: [phone]       Fax: [phone]   RxID:   [id number] COREG 6.25 MG TABS (CARVEDILOL) 1 tab two times a day with meals  #200 x 3   Entered and Authorized by:   Roderick Pee MD   Signed by:   Roderick Pee MD on 08/03/2010   Method used:   [facility]ronically to        [facility] [phone]* (retail)       [address]       [place], [place]  [address]       Ph: [phone]        Fax: [phone]   RxID:   [id number] PLAVIX 75 MG TABS (CLOPIDOGREL BISULFATE) 1 tab daily  #100 x 3   Entered and Authorized by:   Roderick Pee MD   Signed by:   Roderick Pee MD on 08/03/2010   Method used:   Electronically to        [facility] [phone]* (retail)       [address]       [place], [place]  [address]       Ph: [phone]       Fax: [phone]  RxID:   [id number] QUINAPRIL-HYDROCHLOROTHIAZIDE 20-12.5 MG TABS (QUINAPRIL-HYDROCHLOROTHIAZIDE) 1 qd  #100 x 3   Entered and Authorized by:   Roderick Pee MD   Signed by:   Roderick Pee MD on 08/03/2010   Method used:   Electronically to        [facility] [phone]* (retail)       [address]       [place], [place]  [address]       Ph: [phone]       Fax: [phone]   RxID:   [id number] ACCU-CHEK AVIVA  STRP (GLUCOSE BLOOD) four times a day 250.01, and lancets  #1 month x 5   Entered and Authorized by:   Roderick Pee MD   Signed by:   Roderick Pee MD on 08/03/2010   Method used:   Electronically to        [facility] [phone]* (retail)       [address]       [place], [place]  [address]       Ph: [phone]       Fax: [phone]   RxID:   [id number]    Orders Added: 1)  Venipuncture [83151] 2)  TLB-Lipid Panel [80061-LIPID] 3)  TLB-BMP (Basic Metabolic Panel-BMET) [80048-METABOL] 4)  TLB-CBC Platelet - w/Differential [85025-CBCD] 5)  TLB-Hepatic/Liver Function Pnl [80076-HEPATIC] 6)  TLB-TSH (Thyroid Stimulating Hormone) [84443-TSH] 7)  TLB-A1C / Hgb A1C (Glycohemoglobin) [83036-A1C] 8)  TLB-PSA (Prostate Specific Antigen) [76160-VPX] 9)  TLB-Microalbumin/Creat Ratio, Urine [82043-MALB] 10)  Prescription Created Electronically [G8553] 11)  Est. Patient 40-64 years [99396] 12)  UA Dipstick w/Micro (automated) [81001]     Laboratory Results   Urine Tests  Date/Time  Recieved: August 03, 2010 12:32 PM  Date/Time Reported: August 03, 2010 12:32 PM   Routine Urinalysis   Color: yellow Appearance: Clear Glucose: 2+   (Normal Range: Negative) Bilirubin: 1+   (Normal Range: Negative) Ketone: 3+   (Normal Range: Negative) Spec. Gravity: >=1.030   (Normal Range: 1.003-1.035) Blood: negative   (Normal Range: Negative) pH: 5.0   (Normal Range: 5.0-8.0) Protein: trace   (Normal Range: Negative) Urobilinogen: 0.2   (Normal Range: 0-1) Nitrite: negative   (Normal Range: Negative) Leukocyte Esterace: negative   (Normal Range: Negative)    Comments: Wynona Canes, CMA  August 03, 2010 12:32 PM

## 2010-12-08 ENCOUNTER — Ambulatory Visit (INDEPENDENT_AMBULATORY_CARE_PROVIDER_SITE_OTHER): Payer: 59 | Admitting: Endocrinology

## 2010-12-08 ENCOUNTER — Encounter: Payer: Self-pay | Admitting: Endocrinology

## 2010-12-08 ENCOUNTER — Other Ambulatory Visit: Payer: 59

## 2010-12-08 ENCOUNTER — Other Ambulatory Visit: Payer: Self-pay | Admitting: Endocrinology

## 2010-12-08 DIAGNOSIS — E119 Type 2 diabetes mellitus without complications: Secondary | ICD-10-CM

## 2010-12-15 NOTE — Assessment & Plan Note (Signed)
Summary: ?pt thinks he may have taken too much insulin/pt walked in re...   Vital Signs:  Patient profile:   65 year old male Height:      72 inches (182.88 cm) Weight:      194 pounds (88.18 kg) BMI:     26.41 O2 Sat:      98 % on Room air Temp:     94.3 degrees F (34.61 degrees C) oral Pulse rate:   72 / minute Pulse rhythm:   regular BP sitting:   128 / 92  (left arm) Cuff size:   regular  Vitals Entered By: Brenton Grills CMA Duncan Dull) (December 08, 2010 11:17 AM)  O2 Flow:  Room air CC: Pt states that he thinks he took too much insulin, hypoglycemic episode/aj Is Patient Diabetic? Yes CBG Result 185   CC:  Pt states that he thinks he took too much insulin and hypoglycemic episode/aj.  History of Present Illness: pt says cbg's were well-controlled, until last night, when it was high after a large meal.  he took and extra 20 units last night.  this am at work, he had an episode of hypoglycemia.  he had severe diaphoresis and slurred speech.  with oral glucose, cbg is back up to 180, and sxs are resolved.  he has lost 2 lbs x last 4 mos.  he says cbg's are higher on the weekend.  Current Medications (verified): 1)  Multivitamins   Tabs (Multiple Vitamin) .... Take 1 By Mouth Qd 2)  Bd U/f Original Pen Needle 29g X 12.82mm  Misc (Insulin Pen Needle) .... Qid 3)  Accu-Chek Aviva  Strp (Glucose Blood) .... Four Times A Day 250.01, and Lancets 4)  Quinapril-Hydrochlorothiazide 20-12.5 Mg Tabs (Quinapril-Hydrochlorothiazide) .Marland Kitchen.. 1 Qd 5)  Plavix 75 Mg Tabs (Clopidogrel Bisulfate) .Marland Kitchen.. 1 Tab Daily 6)  Coreg 6.25 Mg Tabs (Carvedilol) .Marland Kitchen.. 1 Tab Two Times A Day With Meals 7)  Simvastatin 40 Mg Tabs (Simvastatin) .Marland Kitchen.. 1 Tab Daily 8)  Aspirin 81mg  .... Once Daily 9)  Levemir Flexpen 100 Unit/ml Soln (Insulin Detemir) .... 60 Units Each Am, and Pen Needles Once Daily 10)  Accu-Chek Multiclix Lancets  Misc (Lancets) .... Use As Directed  Allergies (verified): 1)  ! Metformin Hcl (Metformin  Hcl)  Past History:  Past Medical History: Last updated: 04/24/2007 Diabetes mellitus, type II Hyperlipidemia Hypertension Pancreatitis, hx of  Review of Systems  The patient denies syncope.    Physical Exam  General:  normal appearance.   Pulses:  dorsalis pedis intact bilat.   Extremities:  no deformity.  no ulcer on the feet.  feet are of normal color and temp.  no edema  Neurologic:  sensation is intact to touch on the feet  Additional Exam:  Hemoglobin A1C       [H]  12.0 %    Impression & Recommendations:  Problem # 1:  DIABETES MELLITUS, TYPE II (ICD-250.00) therapy limited by noncompliance with cbg recording, and with f/u appts.  i'll do the best i can.    Medications Added to Medication List This Visit: 1)  Accu-chek Aviva Kit (Blood glucose monitoring suppl) .... As dir  Other Orders: TLB-A1C / Hgb A1C (Glycohemoglobin) (83036-A1C) Gastroenterology Referral (GI) Est. Patient Level III (81191)  Patient Instructions: 1)  blood tests are being ordered for you today.  please call [phone] to hear your test results. 2)  pending the test results, please continue levemir 60 units each am monday-friday.  on sat and sunday,  tke 70 units each am.  it is very important to take only the number of units that are prescribed.   3)  on this insulin schedule, meals should be eaten on a regular basis.   4)  check your blood sugar 2 times a day.  vary the time of day when you check, between before the 3 meals, and at bedtime.  also check if you have symptoms of your blood sugar being too high or too low.  please keep a record of the readings and bring it to your next appointment here.  please call us sooner if you are having low blood sugar episodes. 5)  Please schedule a follow-up appointment in 6 weeks. 6)  (update: i left message on phone-tree:  rx as we discussed, except ret 2 weeks). Prescriptions: ACCU-CHEK AVIVA  KIT (BLOOD GLUCOSE MONITORING SUPPL) as dir  #1 device x  0   Entered and Authorized by:   Minus Breeding MD   Signed by:   Minus Breeding MD on 12/08/2010   Method used:   Electronically to        [facility] [phone]* (retail)       [address]       [place], [place]  [address]       Ph: [phone]       Fax: [phone]   RxID:   [id number]    Orders Added: 1)  TLB-A1C / Hgb A1C (Glycohemoglobin) [83036-A1C] 2)  Gastroenterology Referral [GI] 3)  Est. Patient Level III [99213]      Laboratory Results   Blood Tests    Date/Time Reported: 12/08/2010 11:15am  CBG Random:: 185mg /dL

## 2010-12-18 LAB — MAGNESIUM: Magnesium: 1.9 mg/dL (ref 1.5–2.5)

## 2010-12-18 LAB — CK TOTAL AND CKMB (NOT AT ARMC): CK, MB: 15 ng/mL (ref 0.3–4.0)

## 2010-12-18 LAB — BASIC METABOLIC PANEL
BUN: 11 mg/dL (ref 6–23)
CO2: 31 mEq/L (ref 19–32)
Calcium: 9 mg/dL (ref 8.4–10.5)
Chloride: 104 mEq/L (ref 96–112)
Creatinine, Ser: 0.84 mg/dL (ref 0.4–1.5)
Glucose, Bld: 271 mg/dL — ABNORMAL HIGH (ref 70–99)

## 2010-12-18 LAB — LIPID PANEL
Cholesterol: 135 mg/dL (ref 0–200)
HDL: 29 mg/dL — ABNORMAL LOW (ref >39)
LDL Cholesterol: 75 mg/dL (ref 0–99)
Total CHOL/HDL Ratio: 4.7 RATIO

## 2010-12-18 LAB — CARDIAC PANEL(CRET KIN+CKTOT+MB+TROPI)
CK, MB: 18.3 ng/mL (ref 0.3–4.0)
CK, MB: 8.2 ng/mL (ref 0.3–4.0)
Relative Index: 4.3 — ABNORMAL HIGH (ref 0.0–2.5)
Total CK: 269 U/L — ABNORMAL HIGH (ref 7–232)

## 2010-12-18 LAB — DIFFERENTIAL
Basophils Relative: 0 % (ref 0–1)
Eosinophils Absolute: 0 10*3/uL (ref 0.0–0.7)
Monocytes Absolute: 0.5 10*3/uL (ref 0.1–1.0)
Monocytes Relative: 7 % (ref 3–12)
Neutrophils Relative %: 72 % (ref 43–77)

## 2010-12-18 LAB — POCT I-STAT, CHEM 8
Calcium, Ion: 1.19 mmol/L (ref 1.12–1.32)
Creatinine, Ser: 0.6 mg/dL (ref 0.4–1.5)
Hemoglobin: 13.9 g/dL (ref 13.0–17.0)
Sodium: 139 mEq/L (ref 135–145)
TCO2: 29 mmol/L (ref 0–100)

## 2010-12-18 LAB — GLUCOSE, CAPILLARY
Glucose-Capillary: 227 mg/dL — ABNORMAL HIGH (ref 70–99)
Glucose-Capillary: 261 mg/dL — ABNORMAL HIGH (ref 70–99)
Glucose-Capillary: 91 mg/dL (ref 70–99)

## 2010-12-18 LAB — COMPREHENSIVE METABOLIC PANEL
ALT: 36 U/L (ref 0–53)
Alkaline Phosphatase: 56 U/L (ref 39–117)
BUN: 9 mg/dL (ref 6–23)
CO2: 30 mEq/L (ref 19–32)
Chloride: 101 mEq/L (ref 96–112)
GFR calc non Af Amer: >60 mL/min (ref >60)
Glucose, Bld: 281 mg/dL — ABNORMAL HIGH (ref 70–99)
Potassium: 3.4 mEq/L — ABNORMAL LOW (ref 3.5–5.1)
Sodium: 137 mEq/L (ref 135–145)
Total Bilirubin: 1.1 mg/dL (ref 0.3–1.2)
Total Protein: 6.2 g/dL (ref 6.0–8.3)

## 2010-12-18 LAB — TROPONIN I: Troponin I: 3.11 ng/mL (ref 0.00–0.06)

## 2010-12-18 LAB — CBC
MCHC: 34.3 g/dL (ref 30.0–36.0)
MCV: 93.2 fL (ref 78.0–100.0)
RBC: 4.51 MIL/uL (ref 4.22–5.81)

## 2010-12-18 LAB — POCT CARDIAC MARKERS: Myoglobin, poc: 223 ng/mL (ref 12–200)

## 2010-12-18 LAB — MRSA PCR SCREENING: MRSA by PCR: NEGATIVE

## 2010-12-18 LAB — TSH: TSH: 1.178 u[IU]/mL (ref 0.350–4.500)

## 2011-01-17 ENCOUNTER — Emergency Department (HOSPITAL_COMMUNITY): Payer: 59

## 2011-01-17 ENCOUNTER — Emergency Department (HOSPITAL_COMMUNITY)
Admission: EM | Admit: 2011-01-17 | Discharge: 2011-01-18 | Disposition: A | Discharge status: Home / Self Care | Payer: 59 | Attending: Emergency Medicine | Admitting: Emergency Medicine

## 2011-01-17 DIAGNOSIS — R112 Nausea with vomiting, unspecified: Secondary | ICD-10-CM | POA: Insufficient documentation

## 2011-01-17 DIAGNOSIS — R197 Diarrhea, unspecified: Secondary | ICD-10-CM | POA: Insufficient documentation

## 2011-01-17 DIAGNOSIS — R1013 Epigastric pain: Secondary | ICD-10-CM | POA: Insufficient documentation

## 2011-01-17 DIAGNOSIS — Z794 Long term (current) use of insulin: Secondary | ICD-10-CM | POA: Insufficient documentation

## 2011-01-17 DIAGNOSIS — E119 Type 2 diabetes mellitus without complications: Secondary | ICD-10-CM | POA: Insufficient documentation

## 2011-01-17 LAB — COMPREHENSIVE METABOLIC PANEL
ALT: 44 U/L (ref 0–53)
AST: 38 U/L — ABNORMAL HIGH (ref 0–37)
Alkaline Phosphatase: 66 U/L (ref 39–117)
CO2: 28 mEq/L (ref 19–32)
Calcium: 9.4 mg/dL (ref 8.4–10.5)
Chloride: 99 mEq/L (ref 96–112)
GFR calc Af Amer: >60 mL/min (ref >60)
GFR calc non Af Amer: >60 mL/min (ref >60)
Glucose, Bld: 267 mg/dL — ABNORMAL HIGH (ref 70–99)
Potassium: 3.9 mEq/L (ref 3.5–5.1)
Sodium: 139 mEq/L (ref 135–145)
Total Bilirubin: 1.1 mg/dL (ref 0.3–1.2)

## 2011-01-17 LAB — COMPREHENSIVE METABOLIC PANEL WITH GFR
Albumin: 3.9 g/dL (ref 3.5–5.2)
BUN: 17 mg/dL (ref 6–23)
Creatinine, Ser: 0.89 mg/dL (ref 0.4–1.5)
Total Protein: 6.9 g/dL (ref 6.0–8.3)

## 2011-01-17 LAB — URINALYSIS, ROUTINE W REFLEX MICROSCOPIC
Hgb urine dipstick: NEGATIVE
Ketones, ur: >80 mg/dL — AB
Nitrite: NEGATIVE
Specific Gravity, Urine: 1.035 — ABNORMAL HIGH (ref 1.005–1.030)
Urobilinogen, UA: 0.2 mg/dL (ref 0.0–1.0)
pH: 5.5 (ref 5.0–8.0)

## 2011-01-17 LAB — DIFFERENTIAL
Basophils Absolute: 0 10*3/uL (ref 0.0–0.1)
Basophils Relative: 0 % (ref 0–1)
Eosinophils Absolute: 0.1 K/uL (ref 0.0–0.7)
Eosinophils Relative: 1 % (ref 0–5)
Lymphocytes Relative: 18 % (ref 12–46)
Lymphs Abs: 1.5 K/uL (ref 0.7–4.0)
Monocytes Absolute: 0.9 K/uL (ref 0.1–1.0)
Monocytes Relative: 11 % (ref 3–12)
Neutro Abs: 5.6 10*3/uL (ref 1.7–7.7)
Neutrophils Relative %: 70 % (ref 43–77)

## 2011-01-17 LAB — CBC
HCT: 40.4 % (ref 39.0–52.0)
Hemoglobin: 14.7 g/dL (ref 13.0–17.0)
MCH: 31.7 pg (ref 26.0–34.0)
MCHC: 36.4 g/dL — ABNORMAL HIGH (ref 30.0–36.0)
MCV: 87.1 fL (ref 78.0–100.0)
Platelets: 211 K/uL (ref 150–400)
RBC: 4.64 MIL/uL (ref 4.22–5.81)
RDW: 13.1 % (ref 11.5–15.5)
WBC: 8 10*3/uL (ref 4.0–10.5)

## 2011-01-17 LAB — URINE MICROSCOPIC-ADD ON

## 2011-01-17 LAB — GLUCOSE, CAPILLARY: Glucose-Capillary: 264 mg/dL — ABNORMAL HIGH (ref 70–99)

## 2011-01-17 LAB — LIPASE, BLOOD: Lipase: 15 U/L (ref 11–59)

## 2011-01-18 ENCOUNTER — Telehealth: Payer: Self-pay | Admitting: Internal Medicine

## 2011-01-18 ENCOUNTER — Telehealth: Payer: Self-pay | Admitting: *Deleted

## 2011-01-18 NOTE — Telephone Encounter (Signed)
Ok two refer to GI

## 2011-01-18 NOTE — Telephone Encounter (Signed)
Patient was seen in the ER for severe abdominal pain.  He will come in and see Amy 01/20/11 3:00

## 2011-01-18 NOTE — Telephone Encounter (Signed)
Sister called stating pt was evaluated in the ER last night for abdominal pain and told to talk to Dr. Tawanna Cooler about a referral to a GI MD.  They called GI but were told it has to be a referral.

## 2011-01-19 ENCOUNTER — Ambulatory Visit (INDEPENDENT_AMBULATORY_CARE_PROVIDER_SITE_OTHER): Payer: 59 | Admitting: Physician Assistant

## 2011-01-19 ENCOUNTER — Encounter: Payer: Self-pay | Admitting: Physician Assistant

## 2011-01-19 ENCOUNTER — Ambulatory Visit: Payer: 59 | Admitting: Endocrinology

## 2011-01-19 VITALS — BP 144/86 | HR 104 | Ht 72.0 in | Wt 182.0 lb

## 2011-01-19 DIAGNOSIS — R1013 Epigastric pain: Secondary | ICD-10-CM

## 2011-01-19 DIAGNOSIS — K219 Gastro-esophageal reflux disease without esophagitis: Secondary | ICD-10-CM

## 2011-01-19 DIAGNOSIS — R110 Nausea: Secondary | ICD-10-CM

## 2011-01-19 DIAGNOSIS — R197 Diarrhea, unspecified: Secondary | ICD-10-CM

## 2011-01-19 MED ORDER — PEG-KCL-NACL-NASULF-NA ASC-C 100 G PO SOLR: 1.0000 | Freq: Once | ORAL | Status: AC

## 2011-01-19 NOTE — Patient Instructions (Signed)
Ultrasound scheduled for Fri 4-27. Directions and brochure provided. Endo/colon scheduled . Directions given. Take Omeprazole once daily 30 min before breakfast. We have given you a prescription for Moviprep, the colonoscopy prep.

## 2011-01-20 ENCOUNTER — Encounter: Payer: Self-pay | Admitting: Internal Medicine

## 2011-01-22 ENCOUNTER — Ambulatory Visit (HOSPITAL_COMMUNITY)
Admission: RE | Admit: 2011-01-22 | Discharge: 2011-01-22 | Disposition: A | Discharge status: Home / Self Care | Payer: 59 | Source: Ambulatory Visit | Attending: Physician Assistant | Admitting: Physician Assistant

## 2011-01-22 DIAGNOSIS — K8689 Other specified diseases of pancreas: Secondary | ICD-10-CM | POA: Insufficient documentation

## 2011-01-22 DIAGNOSIS — R110 Nausea: Secondary | ICD-10-CM | POA: Insufficient documentation

## 2011-01-22 DIAGNOSIS — K219 Gastro-esophageal reflux disease without esophagitis: Secondary | ICD-10-CM

## 2011-01-22 DIAGNOSIS — R1013 Epigastric pain: Secondary | ICD-10-CM

## 2011-01-22 DIAGNOSIS — K7689 Other specified diseases of liver: Secondary | ICD-10-CM | POA: Insufficient documentation

## 2011-01-22 DIAGNOSIS — R197 Diarrhea, unspecified: Secondary | ICD-10-CM | POA: Insufficient documentation

## 2011-01-22 DIAGNOSIS — E119 Type 2 diabetes mellitus without complications: Secondary | ICD-10-CM | POA: Insufficient documentation

## 2011-01-22 NOTE — Progress Notes (Signed)
Subjective:    Patient ID: David Lane, male    DOB: January 22, 1946, 65 y.o.   MRN: [id number]  HPI David Lane is a pleasant 65 year old male referred today per Dr. Tawanna Cooler for evaluation of abdominal pain and diarrhea. He is new to GI. He relates a remote history of pancreatitis approximately 12 years ago. He says no clear etiology was ever determined, but he was evaluated by Dr. make out at that time. He is a diabetic, has history of hypertension, hyperlipidemia, and has nonobstructive coronary artery disease.  Patient reports that he has had problems with diarrhea over the past several years, without any evidence of melena or hematochezia. He thought at one point it was due to some of his medication but has since changed his diabetic medication without any change of the diarrhea. Now over the past one month he has had any increase in intestinal gas bloating belching and has developed epigastric burning type pain especially in the mornings. This past weekend he had a bad episode of epigastric abdominal pain associated with nausea and vomiting and went to the emergency room. After arrival there he did vomit and then felt somewhat better. He was given a GI cocktail which also improved his symptoms. Workup in the emergency room with plain abdominal films was unremarkable there was a question of a right renal stone. Seen at lipase and CBC all within normal limits. He was given a prescription for Zofran to use for the nausea and asked to follow up with GI. Over the past couple of days he is feeling better says the abdominal pain has improved and he has not had any diarrhea over the past couple of days. He has not had any documented fever or chills. No regular use of aspirin or NSAIDs. He has not had any prior endoscopic evaluation.    Review of Systems  Constitutional: Negative.   HENT: Negative.   Eyes: Negative.   Respiratory: Negative.   Cardiovascular: Negative.   Gastrointestinal: Positive for nausea,  vomiting, abdominal pain and diarrhea.  Genitourinary: Negative.   Musculoskeletal: Negative.   Neurological: Negative.   Hematological: Negative.   Psychiatric/Behavioral: Negative.         Objective:   Physical Exam Well-developed white male in no acute distress, pleasant, alert and oriented x3 HEENT nontraumatic normocephalic EOMI PERRLA sclera anicteric Neck;; Supple no JVD  Cardiovascular; regular rate and rhythm with S1 and S2 no murmur rub or gallop  Pulmonary; clear bilaterally  Abdomen; soft, mildly tender in the upper abdomen, there is no guarding or rebound no palpable mass or hepatosplenomegaly bowel sounds are active Rectal not done Skin warm and dry No Jaundice  Psych;mood and affect normal an appropriate.       Assessment & Plan:  #62 65 year old white male who with prior history of pancreatitis of unclear etiology, adult onset diabetes mellitus. Patient now with a one-month history of upper abdominal pain gas belching and intense episode several days ago which prompted ER visit. Etiology of his symptoms is not clear , need to rule out gallbladder disease peptic ulcer disease gastroparesis chronic or possible chronic pancreatitis.  Plan; Schedule upper abdominal ultrasound Schedule upper endoscopy with Dr. Dickie La, procedure was discussed in detail with the patient and he is agreeable. Start omeprazole 20 mg by mouth daily in a.m.  #2 Chronic diarrhea, etiology unclear. Rule out IBS, medication induced possible IBD Plan; As patient has not had any prior endoscopic evaluation Will need colonoscopy, and this will be scheduled with  Dr. Juanda Chance . Again procedure was discussed in detail with the patient and he is agreeable to proceed. Further plans for treatment pending findings at colonoscopy.

## 2011-01-23 NOTE — Progress Notes (Signed)
Reviewed and agree.

## 2011-01-25 ENCOUNTER — Telehealth: Payer: Self-pay | Admitting: *Deleted

## 2011-01-25 NOTE — Telephone Encounter (Signed)
Spoke with patient. He wants to call back tomorrow and schedule after he checks his schedule.

## 2011-01-25 NOTE — Telephone Encounter (Signed)
Message copied by Jesse Fall on Mon Jan 25, 2011 10:09 AM ------      Message from: [place], [place]      Created: Fri Jan 22, 2011  4:20 PM       PLEASE CALL MR Hrdlicka. THE US SHOWS AN ABNORMALITY IN HIS LIVER WHICH MAY BE A BENIGN HEMANGIOMA- BUT MRI IS RECOMMENDED- PLEASE SCHEDULE HIM FOR MRI OF THE LIVER  "HYPERECHOIC RIGHT LOBE LIVER LESION"

## 2011-01-26 ENCOUNTER — Telehealth: Payer: Self-pay | Admitting: *Deleted

## 2011-01-26 NOTE — Telephone Encounter (Signed)
Spoke with patient and discussed scheduling the MRI. He is worried about the ECOL procedure. He does not want to schedule the MRI until he gets the colonoscopy/endo done and has results of it. States that if we call him a few days after the procedure he will discuss gettting the MRI done.

## 2011-01-28 ENCOUNTER — Encounter: Payer: 59 | Admitting: Internal Medicine

## 2011-02-03 ENCOUNTER — Encounter: Payer: Self-pay | Admitting: Internal Medicine

## 2011-02-04 ENCOUNTER — Ambulatory Visit (AMBULATORY_SURGERY_CENTER): Payer: 59 | Admitting: Internal Medicine

## 2011-02-04 ENCOUNTER — Other Ambulatory Visit: Payer: 59 | Admitting: Internal Medicine

## 2011-02-04 ENCOUNTER — Encounter: Payer: Self-pay | Admitting: Internal Medicine

## 2011-02-04 ENCOUNTER — Encounter: Payer: Self-pay | Admitting: *Deleted

## 2011-02-04 DIAGNOSIS — K208 Other esophagitis without bleeding: Secondary | ICD-10-CM

## 2011-02-04 DIAGNOSIS — D126 Benign neoplasm of colon, unspecified: Secondary | ICD-10-CM

## 2011-02-04 DIAGNOSIS — K2100 Gastro-esophageal reflux disease with esophagitis, without bleeding: Secondary | ICD-10-CM

## 2011-02-04 DIAGNOSIS — K769 Liver disease, unspecified: Secondary | ICD-10-CM

## 2011-02-04 DIAGNOSIS — K2990 Gastroduodenitis, unspecified, without bleeding: Secondary | ICD-10-CM

## 2011-02-04 DIAGNOSIS — K2970 Gastritis, unspecified, without bleeding: Secondary | ICD-10-CM

## 2011-02-04 DIAGNOSIS — K3184 Gastroparesis: Secondary | ICD-10-CM

## 2011-02-04 DIAGNOSIS — R112 Nausea with vomiting, unspecified: Secondary | ICD-10-CM

## 2011-02-04 DIAGNOSIS — K219 Gastro-esophageal reflux disease without esophagitis: Secondary | ICD-10-CM

## 2011-02-04 DIAGNOSIS — R197 Diarrhea, unspecified: Secondary | ICD-10-CM

## 2011-02-04 HISTORY — DX: Liver disease, unspecified: K76.9

## 2011-02-04 LAB — GLUCOSE, CAPILLARY
Glucose-Capillary: 255 mg/dL — ABNORMAL HIGH (ref 70–99)
Glucose-Capillary: 231 mg/dL — ABNORMAL HIGH (ref 70–99)

## 2011-02-04 MED ORDER — SODIUM CHLORIDE 0.9 % IV SOLN: 500.0000 mL | INTRAVENOUS | Status: DC

## 2011-02-04 NOTE — Patient Instructions (Addendum)
PT. & CAREPARTNER GIVEN INFORMATION ON & DISCUSSED GASTRITIS, ESOPHAGITIS, GASTROPARESIS DIET.DR . Juanda Chance TALKED WITH CAREPARTNER ABOUT FINDINGS AND SHE WOULD FURTHER DISCUSS FINDINGS AND PATHOLOGY RESULTS WITH PT. IN OFFICE @ 3 WEEK FOLLOW UP APPOINT. WITH DR BRODIE IN HER OFFICE. THIS APPOINT . WILL BE MADE BY DR. Regino Schultze OFFICE NURSE & SHE WILL CALL PT. TO SET THIS UP. SAFETY MEASURES FOR TODAY AND RECOMMENDED DIET FOR TODAY DISCUSSED WITH CAREPARTNER & PT. ALSO DISCUSSED BLOOD SUGAR 231IN RECOVERY RM. AND PT NEEDS TO CHECK BLOOD SUGAR EXTRA TIME TONIGHT AND RESUME DIABETIC MEDICINE WHEN ABLE TO EAT.

## 2011-02-04 NOTE — Progress Notes (Signed)
Per Clide Cliff, RN give pt 12.5 mg of benadryl IV as soon as the pt gets in the procedure room.  Per suzanne the pt is very anxious and she spoke with Dr. Juanda Chance about this.  IV benadryl 12.5 mg given in the procedure room.  Pt had a very poor prep.  Pt cleaned up in the procedure room before being taken to the recovery room.  Pt tolerated both procedures well.

## 2011-02-05 ENCOUNTER — Telehealth: Payer: Self-pay

## 2011-02-05 NOTE — Telephone Encounter (Signed)
Follow up Call- Patient questions:  Do you have a fever, pain , or abdominal swelling? no  Pain Score  0 *  Have you tolerated food without any problems? no  Have you been able to return to your normal activities? no  Do you have any questions about your discharge instructions: Diet   no Medications  no Follow up visit  no  Do you have questions or concerns about your Care? no  Actions: * If pain score is 4 or above: No action needed, pain <4. Per pt he c/o gas.  He ate at the cafeteria and had baked chicken and some veggies.  I advised him the veggies were not the best choice.  He said he has been belching up long burps this am.  He felt "bad" after he ate breakfast this am, but better after belching.  He did not go to work because he did not want to be belching at work.  I advised him to eat lightly until he has passed all the gas.  To Call us if he does not improve.  I advised him the 3rd floor will be calling him to make an appoint for 3 weeks with Dr. Juanda Chance after the BX results are back and to made further recommendations.MAW

## 2011-02-09 ENCOUNTER — Telehealth: Payer: Self-pay | Admitting: *Deleted

## 2011-02-09 NOTE — Telephone Encounter (Signed)
Message copied by Jesse Fall on Tue Feb 09, 2011  9:45 AM ------      Message from: [place], [place]      Created: Fri Jan 22, 2011  4:20 PM       PLEASE CALL MR [place]. THE US SHOWS AN ABNORMALITY IN HIS LIVER WHICH MAY BE A BENIGN HEMANGIOMA- BUT MRI IS RECOMMENDED- PLEASE SCHEDULE HIM FOR MRI OF THE LIVER  "HYPERECHOIC RIGHT LOBE LIVER LESION"

## 2011-02-09 NOTE — Telephone Encounter (Signed)
Left message for patient to call me

## 2011-02-12 ENCOUNTER — Ambulatory Visit: Payer: 59 | Admitting: Endocrinology

## 2011-02-12 NOTE — Op Note (Signed)
NAMEMARCY, BOGOSIAN              ACCOUNT NO.:  [id number]   MEDICAL RECORD NO.:  [id number]          PATIENT TYPE:  EMS   LOCATION:  ED                           FACILITY:  [facility]   PHYSICIAN:  Artist Pais. Weingold, M.D.DATE OF BIRTH:  [birth year]   DATE OF PROCEDURE:  07/16/2005  DATE OF DISCHARGE:                                 OPERATIVE REPORT   REQUESTING PHYSICIAN:  Paula Libra, MD   REASON FOR CONSULTATION:  Hewitt Garner is a 65 year old right-handed male  who unfortunately got his dominant right hand caught under a lawnmower  blade.  He presented today with complex injuries to his index and long  finger, dominant right hand.  He is 65 years old.  He is a diabetic.  Currently controlled with oral medication.   PAST MEDICAL HISTORY:  Significant for pancreatitis, for which he was  hospitalized for 10 days around five years ago.  He does not smoke or drink  excessively.  He works for [employer].   FAMILY HISTORY:  Significant for hypertension, diabetes, and heart disease.   SOCIAL HISTORY:  Noncontributory.   PHYSICAL EXAMINATION:  GENERAL:  A stable, well-nourished male. Pleasant.  Alert and oriented x3.  EXTREMITIES:  Examination of his right hand:  He has complex, open injuries  to the tips of his index and long fingers with nail plate fractures.  Dislocation of the nail plate from the eponychial fold and bleeding as well  as mild deformity.   X-rays show comminuted distal phalangeal fracture of the tips of the index  and long fingers.   Patient was given 2% plain lidocaine digital sheath blocks.  When adequate  anesthesia was obtained, it was prepped and draped in the usual sterile  fashion on the right side.  A Freer elevator was used to carefully remove  the nail plate from the underlying nail bed about the index and long finger.  The open fractures were reduced and debrided.  They were then repaired using  6-0 chromic to repair the nail beds, both of which were  complex and stellate  in their nature, followed by 4-0 nylon on the skin.  The nail plates were  carefully placed underneath the eponychial folds, and both fingers were  dressed with Xeroform, 4x4s, and Coban wrap.   Patient was discharged from the emergency department with Keflex 500 mg 1  p.o. q.i.d. for a week as well as Percocet for pain.  Follow up in my office  on July 22, 2005.  Follow up sooner if there are any signs of infection.      Artist Pais Mina Marble, M.D.  Electronically Signed     MAW/MEDQ  D:  07/16/2005  T:  07/16/2005  Job:  [id number]

## 2011-02-12 NOTE — Discharge Summary (Signed)
NAME:  David Lane, David Lane                        ACCOUNT NO.:  [id number]   MEDICAL RECORD NO.:  [id number]                   PATIENT TYPE:  INP   LOCATION:  0354                                 FACILITY:  [facility]   PHYSICIAN:  Rene Paci, M.D. [facility]          DATE OF BIRTH:  05/30/1946   DATE OF ADMISSION:  09/26/2003  DATE OF DISCHARGE:  09/30/2003                                 DISCHARGE SUMMARY   DISCHARGE DIAGNOSES:  1. Diabetic ketoacidosis secondary to pancreatitis, resolved.  2. Dehydration secondary to above, resolved.  3. Type 2 diabetes.  4. History of severe pancreatitis.  5. Hypokalemia secondary to above, replaced.  6. Upper respiratory tract infection.  7. History of hypertension.   DISCHARGE MEDICATIONS:  1. Lantus 15 units subcutaneously q.h.s.  No sliding scale insulin at this     time.  2. Actos 45 mg p.o. daily.  3. Glucophage 1000 mg p.o. b.i.d.  4. Glipizide 10 mg p.o. daily.  5. Accupril 10 mg p.o. b.i.d.  6. Nasonex as needed.   CONDITION ON DISCHARGE:  Medically improved, tolerating regular diet.   FOLLOWUP:  With his primary care physician, Dr. Alonza Smoker, as previously  scheduled for Wednesday, October 02, 2003, to include a general physical exam  and review of his diabetic regimen.   DISPOSITION:  The patient is discharged home.   HOSPITAL COURSE:  #1 -  DIABETIC KETOACIDOSIS:  The patient is a 65 year old  gentleman who presented to the emergency room with weakness and was found to  be in DKA.  He reportedly had upper respiratory tract type symptoms and  became progressively weaker over the three days prior to admission.  On  evaluation in the emergency room, his sugars were around 690, and his pH was  7.03, with a bicarb of 3.  He was treated with glucomander insulin protocol  and made n.p.o.  He was also found to have mildly elevated lipase suggesting  recurrence of his pancreatitis, so this may have been an effect of the DKA  rather than true  pancreatitis.  The patient's symptoms gradually resolved  over the next 48 hours, and when his insulin drip was discontinued he was  begun on Lantus h.s.  He has been able to advance his diet without  difficulty, and his lipase has returned to normal level.  At the time of  discharge, he will be continued on Lantus in addition to his medications as  prior to admission.  His CBG's have been ranging in the low 200s, but  further adjustment of this will be deferred to his primary care physician as  the patient is reluctant to make major changes without further discussion of  this with Dr. Tawanna Cooler.  No sliding scale insulin has been recommended for the  patient at this time.  The patient was educated on this and may be treated  with this in the future.  #2 -  UPPER  RESPIRATORY TRACT INFECTION:  It is unclear if this was the  actual trigger of the patient's DKA.  The patient did have a runny nose and  sneezing during this admission.  He has been treated with Nasonex and seems  to have improvement in his symptoms.  No other treatment other than  symptomatic at this time.  #3 -  HYPOKALEMIA:  The patient's electrolyte abnormalities were likely  stemmed from his DKA.  His potassium is being replaced even as of discharge.  Further followup and replacement per his primary care physician in 48 hours  at his physical exam followup.                                               Rene Paci, M.D. [facility]    VL/MEDQ  D:  09/30/2003  T:  09/30/2003  Job:  [id number]

## 2011-02-12 NOTE — H&P (Signed)
NAME:  LAMARCO, GUDIEL                        ACCOUNT NO.:  [id number]   MEDICAL RECORD NO.:  [id number]                   PATIENT TYPE:  EMS   LOCATION:  ED                                   [facility]ACILITY[facility]  [facility]   PHYSICIAN:  Titus Dubin. Alwyn Ren, M.D. [facility]         DATE OF BIRTH:  August 14, 1946   DATE OF ADMISSION:  09/26/2003  DATE OF DISCHARGE:                                HISTORY & PHYSICAL   Mr. Coby is a 65 year old white male with diabetes admitted with diabetic  ketoacidosis.   His illness began as sneezing and coughing on September 21, 2003, which  resolved spontaneously over the next 24-36 hours. On September 23, 2003, he  began to have nausea and vomiting without trigger. From September 23, 2003,  to his admission he essentially has had no oral nutrition or medications. He  has been profoundly weak. He describes rib cage discomfort from the  vomiting. He denies any abdominal pain, fever, chills, sweats, purulence, or  diarrhea. He has had no genitourinary symptoms.   He had a complete physical scheduled for September 24, 2003, but this was  rescheduled to October 02, 2003.  He did not seek medical treatment and was  not monitoring his sugars. He felt he may be able to hold out until his  physical.   In 1997 he was hospitalized with pancreatitis in the setting of obesity and  apparent statin therapy.  He did not drink at that time and still does not  drink alcohol.  There were no gallstones.   He has had no surgery.   FAMILY HISTORY:  Positive for diabetes. Maternal grandmother had diabetes as  well as paternal grandfather. The paternal grandfather had heart attack in  his 31s.  Maternal grandfather had leukemia.   He walks a mild per day and follows a low-fat, low-fried food, low sugar  diet.  As stated he has not been checking his sugars regularly.   MEDICATIONS:  1. Glucophage 1000 mg b.i.d.  2. Glipizide 10 mg daily.  3. Actos 45 mg daily.  4. Accupril 10 mg b.i.d.  5.  He also takes a multivitamin.  6. He is not on aspirin.   ALLERGIES:  He has no known drug allergies.   HABITS:  He does not drink or smoke.   REVIEW OF SYSTEMS:  As outlined above.   At this time he appears acutely ill and pale, with resting tachycardia on  telemetry. He has arteriolar narrowing. Pulse is 138, oxygen saturation 96%  on room air, blood pressure 96/67, and respiratory rate 22. Tongue is  markedly dry, cotton mouth is coated. He has an upper plate and a lower  partial. Nares note that canals are unremarkable. He exhibits a tachycardia  with a flow murmur. Breath sounds are decreased, but there is no increased  work of breathing. I could not appreciate any odor to his breath.  The  abdomen reveals decreased bowel  sounds and doughy abdomen without tenderness  or masses.  Dorsalis pedis pulses are somewhat thready. He has no edema.  There is tenting of the skin.  Genitorectal exam is initially deferred.   His glucose was initially over 690 and glucometer was initiated. His pH was  7.029 with bicarbonate of 2.8.   He will now be admitted for intravenous fluids with parenteral antinausea  medications to the stepdown unit.  He will clear liquids as tolerated.   His p.o. medications will be held. The pathophysiology BKA was explained to  him and the need for early treatment should he have nausea and vomiting was  stressed.  He was also encouraged to monitor sugars closely should there be  any clinical instability.                                               Titus Dubin. Alwyn Ren, M.D. [facility]    WFH/MEDQ  D:  09/26/2003  T:  09/26/2003  Job:  [id number]   cc:   Tinnie Gens A. Tawanna Cooler, M.D. [facility]

## 2011-02-12 NOTE — Telephone Encounter (Signed)
Left Message for patient to call me 

## 2011-02-13 ENCOUNTER — Encounter: Payer: Self-pay | Admitting: Internal Medicine

## 2011-02-15 ENCOUNTER — Encounter: Payer: Self-pay | Admitting: Internal Medicine

## 2011-02-15 ENCOUNTER — Telehealth: Payer: Self-pay | Admitting: *Deleted

## 2011-02-15 DIAGNOSIS — K859 Acute pancreatitis without necrosis or infection, unspecified: Secondary | ICD-10-CM

## 2011-02-15 NOTE — Telephone Encounter (Signed)
Patient aware of MRI appointment. He understands to get labs on Wednesday and to be NPO 4 hours prior to procedure.

## 2011-02-15 NOTE — Telephone Encounter (Signed)
Message copied by Jesse Fall on Mon Feb 15, 2011  8:51 AM ------      Message from: [place], [place]      Created: Fri Jan 22, 2011  4:20 PM       PLEASE CALL MR Scalf. THE US SHOWS AN ABNORMALITY IN HIS LIVER WHICH MAY BE A BENIGN HEMANGIOMA- BUT MRI IS RECOMMENDED- PLEASE SCHEDULE HIM FOR MRI OF THE LIVER  "HYPERECHOIC RIGHT LOBE LIVER LESION"

## 2011-02-15 NOTE — Telephone Encounter (Signed)
Spoke with patient and he is ready to schedule the MRI. Scheduled patient at [facility] on 02/18/11 at 3:00 PM with 2:45 PM arrival. NPO 4 hours prior. Will need creatinine drawn. Order in [facility] for lab

## 2011-02-15 NOTE — Telephone Encounter (Signed)
Message copied by Jesse Fall on Mon Feb 15, 2011  9:21 AM ------      Message from: [name]      Created: Sat Feb 13, 2011  9:35 PM       Please call pt to start him on Pancreatic enzymes, Generic  Pancrease 2 with each meal , #200, 3 refills.

## 2011-02-15 NOTE — Telephone Encounter (Signed)
Message copied by Jesse Fall on Mon Feb 15, 2011  8:36 AM ------      Message from: [name]      Created: Sat Feb 13, 2011  9:35 PM       Please call pt to start him on Pancreatic enzymes, Generic  Pancrease 2 with each meal , #200, 3 refills.

## 2011-02-15 NOTE — Telephone Encounter (Signed)
Patient also wants Dr. Juanda Chance to know that he is having a lot of gas, burning below breast bone "gets hard as a rock". Then, he belches and is feels soft again.

## 2011-02-15 NOTE — Telephone Encounter (Signed)
Dr. Juanda Chance, What dose of lipase in the pancrease? Please, advise.

## 2011-02-17 ENCOUNTER — Other Ambulatory Visit (INDEPENDENT_AMBULATORY_CARE_PROVIDER_SITE_OTHER): Payer: 59

## 2011-02-17 DIAGNOSIS — K859 Acute pancreatitis without necrosis or infection, unspecified: Secondary | ICD-10-CM

## 2011-02-17 LAB — CREATININE, SERUM: Creatinine, Ser: 0.7 mg/dL (ref 0.4–1.5)

## 2011-02-18 ENCOUNTER — Telehealth: Payer: Self-pay | Admitting: *Deleted

## 2011-02-18 ENCOUNTER — Ambulatory Visit (HOSPITAL_COMMUNITY)
Admission: RE | Admit: 2011-02-18 | Discharge: 2011-02-18 | Disposition: A | Discharge status: Home / Self Care | Payer: 59 | Source: Ambulatory Visit | Attending: Physician Assistant | Admitting: Physician Assistant

## 2011-02-18 DIAGNOSIS — R1013 Epigastric pain: Secondary | ICD-10-CM | POA: Insufficient documentation

## 2011-02-18 DIAGNOSIS — K7689 Other specified diseases of liver: Secondary | ICD-10-CM | POA: Insufficient documentation

## 2011-02-18 DIAGNOSIS — K8689 Other specified diseases of pancreas: Secondary | ICD-10-CM | POA: Insufficient documentation

## 2011-02-18 DIAGNOSIS — K859 Acute pancreatitis without necrosis or infection, unspecified: Secondary | ICD-10-CM

## 2011-02-18 MED ORDER — PANCRELIPASE (LIP-PROT-AMYL) 24000-76000 UNITS PO CPEP: ORAL_CAPSULE | ORAL | Status: DC

## 2011-02-18 MED ORDER — GADOBENATE DIMEGLUMINE 529 MG/ML IV SOLN
20.0000 mL | Freq: Once | INTRAVENOUS | Status: AC | PRN
  Administered 2011-02-18: 20 mL via INTRAVENOUS

## 2011-02-18 NOTE — Telephone Encounter (Signed)
Lipase 24,000 USP, Protease 76,000 USP, amylase 120,000 USP, take 2 with each meal, and only 1 with samll snacks

## 2011-02-18 NOTE — Telephone Encounter (Signed)
Dr. Juanda Chance,

## 2011-02-18 NOTE — Telephone Encounter (Signed)
Spoke with patient and told him I haven sent rx in for Creon. Patient will pick it up and take as directed

## 2011-02-18 NOTE — Telephone Encounter (Signed)
Dr. Juanda Chance, What dose of lipase for patient's Pancrease?

## 2011-02-19 ENCOUNTER — Telehealth: Payer: Self-pay | Admitting: *Deleted

## 2011-02-19 NOTE — Telephone Encounter (Signed)
Opened in error

## 2011-02-24 ENCOUNTER — Telehealth: Payer: Self-pay | Admitting: *Deleted

## 2011-02-24 MED ORDER — PANCRELIPASE (LIP-PROT-AMYL) 24000-76000 UNITS PO CPEP: ORAL_CAPSULE | ORAL | Status: DC

## 2011-02-24 NOTE — Telephone Encounter (Signed)
Spoke with patient and gave him the results of MRI as per Mike Gip, PA. Patient states he did not pick up his rx for Creon yet because it is $75. Told patient I will put samples up front for him to pick up. He will keep his 03/01/11 appointment with Dr. Juanda Chance.

## 2011-02-24 NOTE — Telephone Encounter (Signed)
Message copied by Daphine Deutscher on Wed Feb 24, 2011  2:14 PM ------      Message from: [place], [place] S      Created: Wed Feb 24, 2011 10:21 AM       Please call mr Hengst, and let him know the [place] of liver shows a hemangioma  Which is benign and nothing to worry about. It also showed probable chronic pancreatitis which may be contributing to hsi abdominal pain and diarrhea. Would like him to try creon 24,000 ,2 with each meal.  Give him samples and an rx. He needs a follow up appt with Dr Juanda Chance as well after he has his procedures. thanks

## 2011-03-01 ENCOUNTER — Encounter: Payer: Self-pay | Admitting: Internal Medicine

## 2011-03-01 ENCOUNTER — Other Ambulatory Visit (INDEPENDENT_AMBULATORY_CARE_PROVIDER_SITE_OTHER): Payer: 59

## 2011-03-01 ENCOUNTER — Ambulatory Visit (INDEPENDENT_AMBULATORY_CARE_PROVIDER_SITE_OTHER): Payer: 59 | Admitting: Internal Medicine

## 2011-03-01 DIAGNOSIS — K861 Other chronic pancreatitis: Secondary | ICD-10-CM

## 2011-03-01 DIAGNOSIS — K909 Intestinal malabsorption, unspecified: Secondary | ICD-10-CM

## 2011-03-01 DIAGNOSIS — K3184 Gastroparesis: Secondary | ICD-10-CM

## 2011-03-01 LAB — LIPID PANEL
Cholesterol: 66 mg/dL (ref 0–200)
HDL: 24.4 mg/dL — ABNORMAL LOW (ref >39.00)
VLDL: 7.4 mg/dL (ref 0.0–40.0)

## 2011-03-01 MED ORDER — OMEPRAZOLE 20 MG PO CPDR: DELAYED_RELEASE_CAPSULE | ORAL | Status: DC

## 2011-03-01 MED ORDER — PANCRELIPASE (LIP-PROT-AMYL) 24000-76000 UNITS PO CPEP: ORAL_CAPSULE | ORAL | Status: DC

## 2011-03-01 NOTE — Patient Instructions (Addendum)
Your physician has requested that you go to the basement for the following lab work before leaving today: Stool Fats [place] Stain, Lipid Panel Please take the Pancreas tablets. Take 2 with each meal and 1 with each snack. Please take omeprazole 20 mg. Take 1 tablet by mouth every night.

## 2011-03-01 NOTE — Progress Notes (Signed)
David Lane 03/23/1946 MRN [id number]    History of Present Illness:  This is a 65 year old white male with weight loss and a history of chronic pancreatitis. He is status post acute necrotizing pancreatitis with pseudocyst 12 years ago requiring a six-month hospitalization. He has developed diabetes and fatty yellow stools. He has had gradual weight loss, gas and bloating. An upper endoscopy and colonoscopy recently showed the question of esophageal varices, retained food in the stomach and gastritis. A colonoscopy showed a poor prep but otherwise normal mucosa. Biopsies showed normal colonic mucosa. A CT scan of the abdomen recently showed dilated main pancreatic duct consistent with chronic pancreatitis. Atrophic head of the pancreas, normal spleen size and gallbladder. 3.5x3.1 the shin the right lobe of the liver consistent with hemangioma. His lipase and CBC were normal. His main complaint has been diarrhea urgency and sometimes incontinence  Past Medical History  Diagnosis Date  . Diabetes mellitus     Type II  . Hyperlipemia   . Hypertension   . Pancreatitis   . Anxiety   . Hepatic lesion 02/04/11  . GERD (gastroesophageal reflux disease)    Past Surgical History  Procedure Date  . Rectal surgery     cyst    reports that he has never smoked. He has never used smokeless tobacco. He reports that he does not drink alcohol or use illicit drugs. family history includes Diabetes in his maternal grandfather and paternal grandmother.  There is no history of Colon cancer. Allergies  Allergen Reactions  . Metformin     REACTION: diarrhea        Review of Systems:  The remainder of the 10  point ROS is negative except as outlined in H&P   Physical Exam: General appearance  Well developed in no distress Eyes- non icteric HEENT nontraumatic, normocephalic Mouth no lesions, tongue papillated, no cheilosis Neck supple without adenopathy, thyroid not enlarged, no carotid  bruits, no JVD Lungs Clear to auscultation bilaterally Cor normal S1 normal S2, regular rhythm , no murmur,  quiet precordium Abdomen mildly protuberant. Soft with hyperactive bowel sounds. No tenderness Rectal: Not done Extremities no pedal edema Skin no lesions Neurological alert and oriented x3 Psychological normal mood and  affect  Assessment and Plan:  Problems #1 chronic pancreatitis. Patient has suspected steatorrhea due to chronic pancreatic insufficiency. We will start pancreas enzyme supplements 2 with each meal and 1 with each snack. We will also obtain stool for [place] stain. We will also fill omeprazole 20 mg a day.  Problem #2 gastroparesis. This could be secondary to diabetes. He should follow a gastroparesis diet and low-fat diet. He will continue omeprazole 20 mg daily. He may need a gastric emptying scan.   03/01/2011 [name]

## 2011-03-10 ENCOUNTER — Other Ambulatory Visit: Payer: Self-pay | Admitting: Internal Medicine

## 2011-03-10 ENCOUNTER — Other Ambulatory Visit: Payer: Self-pay | Admitting: Endocrinology

## 2011-03-10 DIAGNOSIS — K861 Other chronic pancreatitis: Secondary | ICD-10-CM

## 2011-03-10 DIAGNOSIS — K909 Intestinal malabsorption, unspecified: Secondary | ICD-10-CM

## 2011-03-10 MED ORDER — PANCRELIPASE (LIP-PROT-AMYL) 24000-76000 UNITS PO CPEP: ORAL_CAPSULE | ORAL | Status: DC

## 2011-03-10 NOTE — Telephone Encounter (Signed)
Pharmacy states that patient is taking 10 creon daily (2 with each meal and 1 with each snack). He would like a 3 month supply. Okayed 3 month supply with 1 refill.

## 2011-03-25 ENCOUNTER — Ambulatory Visit: Payer: 59 | Admitting: Endocrinology

## 2011-04-02 ENCOUNTER — Other Ambulatory Visit: Payer: Self-pay | Admitting: Endocrinology

## 2011-04-20 ENCOUNTER — Other Ambulatory Visit: Payer: Self-pay | Admitting: Endocrinology

## 2011-04-23 ENCOUNTER — Ambulatory Visit: Payer: 59 | Admitting: Endocrinology

## 2011-05-07 ENCOUNTER — Ambulatory Visit: Payer: 59 | Admitting: Endocrinology

## 2011-05-28 ENCOUNTER — Ambulatory Visit: Payer: 59 | Admitting: Endocrinology

## 2011-06-07 ENCOUNTER — Other Ambulatory Visit: Payer: Self-pay | Admitting: Endocrinology

## 2011-06-18 ENCOUNTER — Ambulatory Visit: Payer: 59 | Admitting: Endocrinology

## 2011-07-23 ENCOUNTER — Ambulatory Visit: Payer: 59 | Admitting: Endocrinology

## 2011-07-30 ENCOUNTER — Ambulatory Visit: Payer: 59 | Admitting: Endocrinology

## 2011-08-27 ENCOUNTER — Other Ambulatory Visit (INDEPENDENT_AMBULATORY_CARE_PROVIDER_SITE_OTHER): Payer: 59

## 2011-08-27 ENCOUNTER — Encounter: Payer: Self-pay | Admitting: Endocrinology

## 2011-08-27 ENCOUNTER — Ambulatory Visit (INDEPENDENT_AMBULATORY_CARE_PROVIDER_SITE_OTHER): Payer: 59 | Admitting: Endocrinology

## 2011-08-27 VITALS — BP 178/98 | HR 79 | Temp 98.3°F | Ht 72.0 in | Wt 187.2 lb

## 2011-08-27 DIAGNOSIS — E119 Type 2 diabetes mellitus without complications: Secondary | ICD-10-CM

## 2011-08-27 LAB — HEMOGLOBIN A1C: Hgb A1c MFr Bld: 12.4 % — ABNORMAL HIGH (ref 4.6–6.5)

## 2011-08-27 MED ORDER — GLUCOSE BLOOD VI STRP: ORAL_STRIP | Status: DC

## 2011-08-27 MED ORDER — INSULIN DETEMIR 100 UNIT/ML ~~LOC~~ SOLN: SUBCUTANEOUS | Status: DC

## 2011-08-27 MED ORDER — QUINAPRIL-HYDROCHLOROTHIAZIDE 20-12.5 MG PO TABS: ORAL_TABLET | ORAL | Status: DC

## 2011-08-27 NOTE — Progress Notes (Signed)
Subjective:    Patient ID: David Lane, male    DOB: 07/01/1946, 65 y.o.   MRN: [id number]  HPI The state of at least three ongoing medical problems is addressed today: Pt returns for f/u of insulin-requiring DM (1988).  He is overdue for f/u.  He has lost weight since last ov.  He takes levemir 60 units qam, and 30 units qpm.  no cbg record, but states cbg's are well-controlled in the afternoon, but over 200 in am.  cbg's continue to be higher on weekends, than during the week.   HTN: denies chest pain He takes creon for diarrhea, and this has helped.  Past Medical History  Diagnosis Date  . Diabetes mellitus     Type II  . Hyperlipemia   . Hypertension   . Pancreatitis   . Anxiety   . Hepatic lesion 02/04/11  . GERD (gastroesophageal reflux disease)     Past Surgical History  Procedure Date  . Rectal surgery     cyst    History   Social History  . Marital Status: Single    Spouse Name: N/A    Number of Children: 0  . Years of Education: N/A   Occupational History  . [employer]   .  [employer]   Social History Main Topics  . Smoking status: Never Smoker   . Smokeless tobacco: Never Used  . Alcohol Use: No  . Drug Use: No  . Sexually Active: Not on file   Other Topics Concern  . Not on file   Social History Narrative  . No narrative on file    Current Outpatient Prescriptions on File Prior to Visit  Medication Sig Dispense Refill  . aspirin 81 MG tablet Take 81 mg by mouth daily.        . BD ULTRA-FINE PEN NEEDLES 29G X 12.7MM MISC USE 4 TIMES A DAY  120 each  4  . Blood Glucose Monitoring Suppl (ACCU-CHEK ACTIVE CARE KIT) KIT by Does not apply route.        . carvedilol (COREG) 6.25 MG tablet Take 6.25 mg by mouth 2 (two) times daily with a meal.        . Lancets (ACCU-CHEK MULTICLIX) lancets 1 each by Other route as directed. Use as instructed       . Loperamide HCl (IMODIUM A-D PO) Take 1 capsule by mouth as needed.        . Multiple Vitamin  (MULTIVITAMIN) capsule Take 1 capsule by mouth daily.        Marland Kitchen omeprazole (PRILOSEC) 20 MG capsule Take 1 tablet by mouth once every night.  30 capsule  3  . Pancrelipase, Lip-Prot-Amyl, 24000 UNITS CPEP Take 2 po with each meal and only one po with small snacks.  810 capsule  1  . simvastatin (ZOCOR) 40 MG tablet TAKE 1 TABLET EVERY DAY  90 tablet  1    Allergies  Allergen Reactions  . Metformin     REACTION: diarrhea    Family History  Problem Relation Age of Onset  . Diabetes Paternal Grandmother   . Diabetes Maternal Grandfather   . Colon cancer Neg Hx    BP 178/98  Pulse 79  Temp(Src) 98.3 F (36.8 C) (Oral)  Ht 6' (1.829 m)  Wt 187 lb 3.2 oz (84.913 kg)  BMI 25.39 kg/m2  SpO2 96%  Review of Systems denies hypoglycemia, abd pain, and sob.      Objective:   Physical  Exam VITAL SIGNS:  See vs page GENERAL: no distress. Pulses: dorsalis pedis intact bilat.   Feet: no deformity.  no ulcer on the feet.  feet are of normal color and temp.  no edema. Neuro: sensation is intact to touch on the feet.  Lab Results  Component Value Date   HGBA1C 12.4* 08/27/2011      Assessment & Plan:  DM, therapy limited by noncompliance with f/u.  i'll do the best i can. HTN, needs increased rx Weight loss.  Could be due to diarrhea, or to chronic severe hyperglycemia.

## 2011-08-27 NOTE — Patient Instructions (Addendum)
blood tests are being ordered for you today.  please call [phone] to hear your test results. pending the test results, please continue levemir 60 units each morning and 30 units every evening, monday-friday.  on sat and sunday, tke 70 units each am, and 30 units each evening.   on this insulin schedule, meals should be eaten on a regular basis.   check your blood sugar 2 times a day.  vary the time of day when you check, between before the 3 meals, and at bedtime.  also check if you have symptoms of your blood sugar being too high or too low.  please keep a record of the readings and bring it to your next appointment here.  please call us sooner if you are having low blood sugar episodes.  Increase quinapril/hctz to 2 pills per day. Please come back for a follow-up appointment in 6 weeks.

## 2011-09-06 ENCOUNTER — Other Ambulatory Visit: Payer: Self-pay | Admitting: *Deleted

## 2011-09-06 MED ORDER — OMEPRAZOLE 20 MG PO CPDR: DELAYED_RELEASE_CAPSULE | ORAL | Status: DC

## 2011-09-13 ENCOUNTER — Ambulatory Visit: Payer: 59 | Admitting: Endocrinology

## 2011-10-12 ENCOUNTER — Other Ambulatory Visit: Payer: Self-pay | Admitting: Endocrinology

## 2011-10-13 ENCOUNTER — Other Ambulatory Visit: Payer: Self-pay | Admitting: *Deleted

## 2011-10-13 MED ORDER — CARVEDILOL 6.25 MG PO TABS: 6.2500 mg | ORAL_TABLET | Freq: Two times a day (BID) | ORAL | Status: DC

## 2011-10-22 ENCOUNTER — Ambulatory Visit: Payer: 59 | Admitting: Endocrinology

## 2011-10-27 ENCOUNTER — Other Ambulatory Visit: Payer: Self-pay | Admitting: Endocrinology

## 2011-11-22 ENCOUNTER — Ambulatory Visit: Payer: 59 | Admitting: Endocrinology

## 2012-03-13 ENCOUNTER — Other Ambulatory Visit: Payer: Self-pay | Admitting: Family Medicine

## 2012-03-13 ENCOUNTER — Other Ambulatory Visit: Payer: Self-pay | Admitting: Internal Medicine

## 2012-04-19 ENCOUNTER — Other Ambulatory Visit: Payer: Self-pay | Admitting: Endocrinology

## 2012-04-25 ENCOUNTER — Other Ambulatory Visit: Payer: Self-pay | Admitting: Internal Medicine

## 2012-06-14 ENCOUNTER — Other Ambulatory Visit: Payer: Self-pay | Admitting: Family Medicine

## 2012-06-14 ENCOUNTER — Other Ambulatory Visit: Payer: Self-pay | Admitting: Internal Medicine

## 2012-06-14 ENCOUNTER — Other Ambulatory Visit: Payer: Self-pay | Admitting: Endocrinology

## 2012-07-13 ENCOUNTER — Emergency Department (HOSPITAL_COMMUNITY): Payer: 59

## 2012-07-13 ENCOUNTER — Other Ambulatory Visit: Payer: Self-pay

## 2012-07-13 ENCOUNTER — Encounter (HOSPITAL_COMMUNITY): Payer: Self-pay | Admitting: Emergency Medicine

## 2012-07-13 ENCOUNTER — Inpatient Hospital Stay (HOSPITAL_COMMUNITY)
Admission: EM | Admit: 2012-07-13 | Discharge: 2012-07-16 | DRG: 247 | Disposition: A | Discharge status: Home / Self Care | Payer: 59 | Attending: Cardiovascular Disease | Admitting: Cardiovascular Disease

## 2012-07-13 DIAGNOSIS — F411 Generalized anxiety disorder: Secondary | ICD-10-CM | POA: Diagnosis present

## 2012-07-13 DIAGNOSIS — K219 Gastro-esophageal reflux disease without esophagitis: Secondary | ICD-10-CM | POA: Diagnosis present

## 2012-07-13 DIAGNOSIS — J811 Chronic pulmonary edema: Secondary | ICD-10-CM | POA: Diagnosis present

## 2012-07-13 DIAGNOSIS — I2510 Atherosclerotic heart disease of native coronary artery without angina pectoris: Secondary | ICD-10-CM | POA: Diagnosis present

## 2012-07-13 DIAGNOSIS — E785 Hyperlipidemia, unspecified: Secondary | ICD-10-CM | POA: Diagnosis present

## 2012-07-13 DIAGNOSIS — I160 Hypertensive urgency: Secondary | ICD-10-CM | POA: Diagnosis present

## 2012-07-13 DIAGNOSIS — Z955 Presence of coronary angioplasty implant and graft: Secondary | ICD-10-CM

## 2012-07-13 DIAGNOSIS — I5042 Chronic combined systolic (congestive) and diastolic (congestive) heart failure: Secondary | ICD-10-CM | POA: Diagnosis present

## 2012-07-13 DIAGNOSIS — E119 Type 2 diabetes mellitus without complications: Secondary | ICD-10-CM

## 2012-07-13 DIAGNOSIS — F419 Anxiety disorder, unspecified: Secondary | ICD-10-CM | POA: Diagnosis present

## 2012-07-13 DIAGNOSIS — I10 Essential (primary) hypertension: Secondary | ICD-10-CM | POA: Diagnosis present

## 2012-07-13 DIAGNOSIS — R0603 Acute respiratory distress: Secondary | ICD-10-CM | POA: Diagnosis present

## 2012-07-13 DIAGNOSIS — I169 Hypertensive crisis, unspecified: Secondary | ICD-10-CM

## 2012-07-13 DIAGNOSIS — I214 Non-ST elevation (NSTEMI) myocardial infarction: Principal | ICD-10-CM | POA: Diagnosis present

## 2012-07-13 DIAGNOSIS — I509 Heart failure, unspecified: Secondary | ICD-10-CM

## 2012-07-13 DIAGNOSIS — J810 Acute pulmonary edema: Secondary | ICD-10-CM

## 2012-07-13 HISTORY — DX: Non-ST elevation (NSTEMI) myocardial infarction: I21.4

## 2012-07-13 HISTORY — DX: Atherosclerotic heart disease of native coronary artery without angina pectoris: I25.10

## 2012-07-13 LAB — POCT I-STAT TROPONIN I: Troponin i, poc: 0.02 ng/mL (ref 0.00–0.08)

## 2012-07-13 LAB — CBC
HCT: 38.4 % — ABNORMAL LOW (ref 39.0–52.0)
MCHC: 35.2 g/dL (ref 30.0–36.0)
MCV: 88.3 fL (ref 78.0–100.0)
RDW: 13.6 % (ref 11.5–15.5)

## 2012-07-13 LAB — GLUCOSE, CAPILLARY: Glucose-Capillary: 291 mg/dL — ABNORMAL HIGH (ref 70–99)

## 2012-07-13 NOTE — ED Notes (Signed)
Pt reports last night is when everything started, he came home from dinner and just wasn't feeling right but this morning when he woke up he felt fine then this evening he started feeling worse.

## 2012-07-13 NOTE — ED Notes (Addendum)
Per EMS, pt had SOB, severe diaphoresis, no CP; BP 220/150, pt was able to stand at home with no dizziness/lightheadedness; pt has felt like he has needed to have BM, initial : CBG 254, BP 246/150, HR 140's initially, now 120's; 18g L post f/a; 94% on 4L, 93% on RA initially;

## 2012-07-13 NOTE — ED Notes (Addendum)
Pt reports last night when he was laying flat he was having trouble breathing but when he sat up he was fine. Pt denies current CP and SOB. Pt skin is dry and pink.

## 2012-07-14 ENCOUNTER — Encounter (HOSPITAL_COMMUNITY): Payer: Self-pay | Admitting: Internal Medicine

## 2012-07-14 ENCOUNTER — Encounter (HOSPITAL_COMMUNITY)
Admission: EM | Disposition: A | Discharge status: Home / Self Care | Payer: Self-pay | Source: Home / Self Care | Attending: Cardiovascular Disease

## 2012-07-14 DIAGNOSIS — I2510 Atherosclerotic heart disease of native coronary artery without angina pectoris: Secondary | ICD-10-CM

## 2012-07-14 DIAGNOSIS — F411 Generalized anxiety disorder: Secondary | ICD-10-CM

## 2012-07-14 DIAGNOSIS — J811 Chronic pulmonary edema: Secondary | ICD-10-CM | POA: Diagnosis present

## 2012-07-14 DIAGNOSIS — R0603 Acute respiratory distress: Secondary | ICD-10-CM | POA: Diagnosis present

## 2012-07-14 DIAGNOSIS — J810 Acute pulmonary edema: Secondary | ICD-10-CM

## 2012-07-14 DIAGNOSIS — I10 Essential (primary) hypertension: Secondary | ICD-10-CM

## 2012-07-14 DIAGNOSIS — I160 Hypertensive urgency: Secondary | ICD-10-CM | POA: Diagnosis present

## 2012-07-14 DIAGNOSIS — I5042 Chronic combined systolic (congestive) and diastolic (congestive) heart failure: Secondary | ICD-10-CM | POA: Diagnosis present

## 2012-07-14 HISTORY — PX: LEFT HEART CATHETERIZATION WITH CORONARY ANGIOGRAM: SHX5451

## 2012-07-14 HISTORY — PX: CORONARY ANGIOPLASTY WITH STENT PLACEMENT: SHX49

## 2012-07-14 HISTORY — PX: PERCUTANEOUS CORONARY STENT INTERVENTION (PCI-S): SHX5485

## 2012-07-14 LAB — CBC
HCT: 35.3 % — ABNORMAL LOW (ref 39.0–52.0)
Hemoglobin: 12.3 g/dL — ABNORMAL LOW (ref 13.0–17.0)
Hemoglobin: 12.9 g/dL — ABNORMAL LOW (ref 13.0–17.0)
MCH: 30.6 pg (ref 26.0–34.0)
MCV: 87.4 fL (ref 78.0–100.0)
MCV: 87.2 fL (ref 78.0–100.0)
RBC: 4.04 MIL/uL — ABNORMAL LOW (ref 4.22–5.81)
RBC: 4.21 MIL/uL — ABNORMAL LOW (ref 4.22–5.81)
RDW: 13.5 % (ref 11.5–15.5)
WBC: 6.8 10*3/uL (ref 4.0–10.5)

## 2012-07-14 LAB — TROPONIN I
Troponin I: 0.62 ng/mL (ref <0.30)
Troponin I: 0.71 ng/mL (ref <0.30)

## 2012-07-14 LAB — COMPREHENSIVE METABOLIC PANEL
Alkaline Phosphatase: 74 U/L (ref 39–117)
BUN: 14 mg/dL (ref 6–23)
Creatinine, Ser: 0.62 mg/dL (ref 0.50–1.35)
GFR calc Af Amer: >90 mL/min (ref >90)
Glucose, Bld: 281 mg/dL — ABNORMAL HIGH (ref 70–99)
Potassium: 3.2 mEq/L — ABNORMAL LOW (ref 3.5–5.1)
Total Protein: 6.3 g/dL (ref 6.0–8.3)

## 2012-07-14 LAB — PRO B NATRIURETIC PEPTIDE: Pro B Natriuretic peptide (BNP): 3110 pg/mL — ABNORMAL HIGH (ref 0–125)

## 2012-07-14 LAB — TSH: TSH: 2.022 u[IU]/mL (ref 0.350–4.500)

## 2012-07-14 LAB — MRSA PCR SCREENING: MRSA by PCR: NEGATIVE

## 2012-07-14 LAB — BASIC METABOLIC PANEL
BUN: 14 mg/dL (ref 6–23)
Chloride: 100 mEq/L (ref 96–112)
Creatinine, Ser: 0.75 mg/dL (ref 0.50–1.35)
GFR calc Af Amer: >90 mL/min (ref >90)
GFR calc non Af Amer: >90 mL/min (ref >90)

## 2012-07-14 LAB — GLUCOSE, CAPILLARY
Glucose-Capillary: 219 mg/dL — ABNORMAL HIGH (ref 70–99)
Glucose-Capillary: 156 mg/dL — ABNORMAL HIGH (ref 70–99)
Glucose-Capillary: 199 mg/dL — ABNORMAL HIGH (ref 70–99)

## 2012-07-14 LAB — CREATININE, SERUM
GFR calc Af Amer: >90 mL/min (ref >90)
GFR calc non Af Amer: >90 mL/min (ref >90)

## 2012-07-14 LAB — MAGNESIUM: Magnesium: 1.5 mg/dL (ref 1.5–2.5)

## 2012-07-14 LAB — POCT ACTIVATED CLOTTING TIME: Activated Clotting Time: 344 seconds

## 2012-07-14 SURGERY — LEFT HEART CATHETERIZATION WITH CORONARY ANGIOGRAM
Anesthesia: LOCAL | Site: Groin | Laterality: Right

## 2012-07-14 MED ORDER — ASPIRIN 81 MG PO CHEW
324.0000 mg | CHEWABLE_TABLET | ORAL | Status: DC
  Filled 2012-07-14: qty 3

## 2012-07-14 MED ORDER — SODIUM CHLORIDE 0.9 % IV SOLN: 250.0000 mL | INTRAVENOUS | Status: DC | PRN

## 2012-07-14 MED ORDER — ASPIRIN 81 MG PO CHEW: 81.0000 mg | CHEWABLE_TABLET | Freq: Every day | ORAL | Status: DC

## 2012-07-14 MED ORDER — PANCRELIPASE (LIP-PROT-AMYL) 12000-38000 UNITS PO CPEP
2.0000 | ORAL_CAPSULE | Freq: Three times a day (TID) | ORAL | Status: DC
  Administered 2012-07-14 – 2012-07-16 (×8): 2 via ORAL
  Filled 2012-07-14 (×10): qty 2

## 2012-07-14 MED ORDER — FUROSEMIDE 10 MG/ML IJ SOLN
20.0000 mg | Freq: Two times a day (BID) | INTRAMUSCULAR | Status: DC
  Administered 2012-07-14: 20 mg via INTRAVENOUS
  Filled 2012-07-14 (×3): qty 2

## 2012-07-14 MED ORDER — ACETAMINOPHEN 325 MG PO TABS: 650.0000 mg | ORAL_TABLET | ORAL | Status: DC | PRN

## 2012-07-14 MED ORDER — FENTANYL CITRATE 0.05 MG/ML IJ SOLN
INTRAMUSCULAR | Status: AC
  Filled 2012-07-14: qty 2

## 2012-07-14 MED ORDER — DIAZEPAM 5 MG PO TABS
5.0000 mg | ORAL_TABLET | ORAL | Status: AC
  Administered 2012-07-14: 5 mg via ORAL
  Filled 2012-07-14: qty 1

## 2012-07-14 MED ORDER — NITROGLYCERIN 0.2 MG/ML ON CALL CATH LAB
INTRAVENOUS | Status: AC
  Filled 2012-07-14: qty 1

## 2012-07-14 MED ORDER — HEPARIN (PORCINE) IN NACL 2-0.9 UNIT/ML-% IJ SOLN
INTRAMUSCULAR | Status: AC
  Filled 2012-07-14: qty 1000

## 2012-07-14 MED ORDER — NITROGLYCERIN IN D5W 200-5 MCG/ML-% IV SOLN
10.0000 ug/min | INTRAVENOUS | Status: DC
Start: 2012-07-14 — End: 2012-07-14
  Administered 2012-07-14: 10 ug/min via INTRAVENOUS
  Filled 2012-07-14: qty 250

## 2012-07-14 MED ORDER — SODIUM CHLORIDE 0.9 % IJ SOLN: 3.0000 mL | Freq: Two times a day (BID) | INTRAMUSCULAR | Status: DC

## 2012-07-14 MED ORDER — PANTOPRAZOLE SODIUM 40 MG PO TBEC
40.0000 mg | DELAYED_RELEASE_TABLET | Freq: Every day | ORAL | Status: DC
  Administered 2012-07-14 – 2012-07-16 (×3): 40 mg via ORAL
  Filled 2012-07-14 (×3): qty 1

## 2012-07-14 MED ORDER — HEPARIN (PORCINE) IN NACL 2-0.9 UNIT/ML-% IJ SOLN
INTRAMUSCULAR | Status: AC
  Filled 2012-07-14: qty 500

## 2012-07-14 MED ORDER — ENOXAPARIN SODIUM 40 MG/0.4ML ~~LOC~~ SOLN
40.0000 mg | Freq: Every day | SUBCUTANEOUS | Status: DC
  Filled 2012-07-14: qty 0.4

## 2012-07-14 MED ORDER — SODIUM CHLORIDE 0.9 % IJ SOLN: 3.0000 mL | INTRAMUSCULAR | Status: DC | PRN

## 2012-07-14 MED ORDER — ASPIRIN EC 81 MG PO TBEC
81.0000 mg | DELAYED_RELEASE_TABLET | Freq: Every day | ORAL | Status: DC
  Administered 2012-07-14 – 2012-07-16 (×3): 81 mg via ORAL
  Filled 2012-07-14 (×3): qty 1

## 2012-07-14 MED ORDER — SODIUM CHLORIDE 0.9 % IV SOLN: INTRAVENOUS | Status: DC

## 2012-07-14 MED ORDER — ASPIRIN EC 325 MG PO TBEC
325.0000 mg | DELAYED_RELEASE_TABLET | Freq: Once | ORAL | Status: AC
  Administered 2012-07-14: 325 mg via ORAL
  Filled 2012-07-14: qty 1

## 2012-07-14 MED ORDER — MIDAZOLAM HCL 2 MG/2ML IJ SOLN
INTRAMUSCULAR | Status: AC
  Filled 2012-07-14: qty 2

## 2012-07-14 MED ORDER — LIDOCAINE HCL (PF) 1 % IJ SOLN
INTRAMUSCULAR | Status: AC
  Filled 2012-07-14: qty 30

## 2012-07-14 MED ORDER — ONDANSETRON HCL 4 MG/2ML IJ SOLN: 4.0000 mg | Freq: Four times a day (QID) | INTRAMUSCULAR | Status: DC | PRN

## 2012-07-14 MED ORDER — SODIUM CHLORIDE 0.9 % IV SOLN
INTRAVENOUS | Status: AC
  Administered 2012-07-14: 20:00:00 via INTRAVENOUS

## 2012-07-14 MED ORDER — LIVING BETTER WITH HEART FAILURE BOOK
Freq: Once | Status: AC
  Administered 2012-07-14
  Filled 2012-07-14: qty 1

## 2012-07-14 MED ORDER — LIVING WELL WITH DIABETES BOOK
Freq: Once | Status: AC
  Administered 2012-07-14
  Filled 2012-07-14: qty 1

## 2012-07-14 MED ORDER — FUROSEMIDE 10 MG/ML IJ SOLN
40.0000 mg | Freq: Once | INTRAMUSCULAR | Status: AC
  Administered 2012-07-14: 40 mg via INTRAVENOUS
  Filled 2012-07-14: qty 4

## 2012-07-14 MED ORDER — INSULIN DETEMIR 100 UNIT/ML ~~LOC~~ SOLN
60.0000 [IU] | Freq: Every day | SUBCUTANEOUS | Status: DC
  Administered 2012-07-14: 30 [IU] via SUBCUTANEOUS
  Administered 2012-07-15 – 2012-07-16 (×2): 60 [IU] via SUBCUTANEOUS
  Filled 2012-07-14: qty 10

## 2012-07-14 MED ORDER — PANCRELIPASE (LIP-PROT-AMYL) 12000-38000 UNITS PO CPEP
1.0000 | ORAL_CAPSULE | ORAL | Status: DC | PRN
  Administered 2012-07-14: 1 via ORAL
  Filled 2012-07-14: qty 1

## 2012-07-14 MED ORDER — BIVALIRUDIN 250 MG IV SOLR
INTRAVENOUS | Status: AC
  Filled 2012-07-14: qty 250

## 2012-07-14 MED ORDER — INSULIN ASPART 100 UNIT/ML ~~LOC~~ SOLN
0.0000 [IU] | Freq: Three times a day (TID) | SUBCUTANEOUS | Status: DC
  Administered 2012-07-14 (×2): 5 [IU] via SUBCUTANEOUS
  Administered 2012-07-14: 3 [IU] via SUBCUTANEOUS
  Administered 2012-07-15: 5 [IU] via SUBCUTANEOUS
  Administered 2012-07-16: 2 [IU] via SUBCUTANEOUS

## 2012-07-14 MED ORDER — INSULIN ASPART 100 UNIT/ML ~~LOC~~ SOLN: 0.0000 [IU] | Freq: Every day | SUBCUTANEOUS | Status: DC

## 2012-07-14 MED ORDER — POTASSIUM CHLORIDE CRYS ER 20 MEQ PO TBCR
40.0000 meq | EXTENDED_RELEASE_TABLET | Freq: Once | ORAL | Status: AC
  Administered 2012-07-14: 21:00:00 40 meq via ORAL
  Filled 2012-07-14 (×2): qty 2

## 2012-07-14 MED ORDER — METOPROLOL TARTRATE 12.5 MG HALF TABLET
12.5000 mg | ORAL_TABLET | Freq: Two times a day (BID) | ORAL | Status: DC
  Administered 2012-07-14 (×2): 12.5 mg via ORAL
  Filled 2012-07-14 (×4): qty 1

## 2012-07-14 MED ORDER — MORPHINE SULFATE 2 MG/ML IJ SOLN
1.0000 mg | INTRAMUSCULAR | Status: DC | PRN
  Administered 2012-07-14: 18:00:00 1 mg via INTRAVENOUS
  Filled 2012-07-14: qty 1

## 2012-07-14 MED ORDER — TICAGRELOR 90 MG PO TABS
90.0000 mg | ORAL_TABLET | Freq: Two times a day (BID) | ORAL | Status: DC
  Administered 2012-07-15 – 2012-07-16 (×3): 90 mg via ORAL
  Filled 2012-07-14 (×5): qty 1

## 2012-07-14 MED ORDER — TICAGRELOR 90 MG PO TABS
ORAL_TABLET | ORAL | Status: AC
  Filled 2012-07-14: qty 2

## 2012-07-14 MED ORDER — INSULIN DETEMIR 100 UNIT/ML ~~LOC~~ SOLN
40.0000 [IU] | Freq: Every day | SUBCUTANEOUS | Status: DC
  Administered 2012-07-14 – 2012-07-15 (×2): 40 [IU] via SUBCUTANEOUS

## 2012-07-14 MED ORDER — ALPRAZOLAM 0.25 MG PO TABS
0.2500 mg | ORAL_TABLET | Freq: Two times a day (BID) | ORAL | Status: DC | PRN
  Administered 2012-07-15: 0.25 mg via ORAL
  Filled 2012-07-14: qty 1

## 2012-07-14 MED ORDER — ANGIOPLASTY BOOK
Freq: Once | Status: AC
  Administered 2012-07-14
  Filled 2012-07-14: qty 1

## 2012-07-14 MED ORDER — ASPIRIN 81 MG PO CHEW
243.0000 mg | CHEWABLE_TABLET | ORAL | Status: AC
  Administered 2012-07-14: 243 mg via ORAL

## 2012-07-14 MED ORDER — LISINOPRIL 40 MG PO TABS
40.0000 mg | ORAL_TABLET | Freq: Every day | ORAL | Status: DC
  Administered 2012-07-14 – 2012-07-16 (×3): 40 mg via ORAL
  Filled 2012-07-14 (×3): qty 1

## 2012-07-14 NOTE — Progress Notes (Addendum)
To cardiac cath lab via stretcher escorted by nurse.

## 2012-07-14 NOTE — ED Notes (Signed)
Pt denies pain, sob, dizziness, blurred vision

## 2012-07-14 NOTE — Op Note (Signed)
David Lane is a 66 y.o. male    [id number] LOCATION:  FACILITY: [facility]  PHYSICIAN: Nanetta Batty, M.D. 22-Jan-1946   DATE OF PROCEDURE:  07/14/2012  DATE OF DISCHARGE:  [facility]  CARDIAC CATHETERIZATION     History obtained from chart review. David Lane is a 66 y.o. male with a past medical history of Diabetes mellitus; Hyperlipemia; Hypertension; Pancreatitis; Anxiety; Hepatic lesion (02/04/11); and GERD (gastroesophageal reflux disease). He presented after he ate some shrimp last night and had hard time sleeping he had shortness of breath when he tried to lay down flat. While watching TV he became clammy an very short of breath. He checked his blood pressure and it was 240's and he called for EMS and he presented to ER he was found to have elevated BP up to 193/126. He have and some nausea no vomiting. He has not been taking his medication for blood pressure. He had no chest pain and no pressure. He was very sweaty during this episode. Patient has self reported problems with anxiety.  He have had extensive recent stressors.  Cardiac history does include NSTEMI with pk troponin of 3.11 in Feb. 2011 with cath revealing subtotally occluded 1st diag branch of LAD -culprit vessel with TIMI 3 flow. EF 55%. The diag vessel was not amenable to stenting due to small caliber and diffuse disease. Pt was treated medically. Other vessels with mild diffuse disease.  Now Troponin has risen to 0.62, Pro BNP on admit 1846. Pt is negative 847 ml. BP with improved control 152/80.   EKG now with flipped t waves in ant. Leads new from admit. ? Occl. Of diagonal.  He had cardiac catheterization performed by Dr. Royann Shivers revealing severe LV dysfunction with an EF in the 25% range and a more pronounced inferior wall motion abnormality. He had a high-grade mid LAD lesion and a high-grade PDA lesion. He presents now for percutaneous intervention.   PROCEDURE DESCRIPTION:        His 5 French sheath in the right common femoral artery was exchanged over a wire for a 6 [place] sheath. The patient received Brilenta  180 mg by mouth load. He had already received aspirin. Using a 6 [place] XB 3.5 cm guide catheter along with a 14/190 cm long a saw he soft guidewire and a 2.5 x 15 mm long expedition drug-eluting stent primary stenting was performed of the mid LAD which angiographically appeared to be 75-80% stenosed. The balloon was deployed at 17 atmospheres (2.75 mm) resulting reduction of a 80% mid LAD lesion to 0% residual residual. Following this the wire was withdrawn and redirected down the dominant circumflex and across the PDA lesion. Predilatation was performed with a 2 mm x 12 mm long emerge balloon. Following this the stenting was performed with a 2.5 mm x 18 mm long expedition drug-eluting stent at 16 atmospheres. A second stent of the same size was then deployed more proximally in overlapping fashion in the entire stented segment was post dilated with a 2.5 mm x 15 mm long noncompliant balloon at 16 atmospheres. 200 mcg of intracoronary nitroglycerin was a minister twice. The final antibiotic result with reduction of a 99% PDA lesion to 0% residual with TIMI-3 flow. The patient tolerated the procedure well. The guidewire and catheter were then removed and the sheath was sewn securely in place. Angiomax was turned off .The ACT was measured at 344. Total contrast administered to the patient was 265 cc including the  diagnostic case.   HEMODYNAMICS:    AO SYSTOLIC/AO DIASTOLIC: 125/73    IMPRESSION:successful PCI and stenting of mid LAD and PDA off the dominant circumflex with expedition drug-eluting stents, Angiomax, and Brilenta . The patient tolerated the procedure well. He left the Cath Lab in stable condition. The sheath will be removed in several hours her pressure will be held on the groin to achieve hemostasis. Patient will be gently hydrated overnight and discharged  home in the morning.  Runell Gess MD, [facility] 07/14/2012 4:04 PM

## 2012-07-14 NOTE — Consult Note (Signed)
Reason for Consult: Pulmonary edema  Referring Physician:  Dr. Maureen Chatters is an 66 y.o. male.    Chief Complaint: Admitted with SOB early AM 07/14/12   HPI: David Lane is a 66 y.o. male with a past medical history of Diabetes mellitus; Hyperlipemia; Hypertension; Pancreatitis; Anxiety; Hepatic lesion (02/04/11); and GERD (gastroesophageal reflux disease).  He presented after he ate some shrimp last night and had hard time sleeping he had shortness of breath when he tried to lay down flat. While watching TV he became clammy an very short of breath. He checked his blood pressure and it was 240's and he called for EMS and he presented to ER he was found to have elevated BP up to 193/126.  He have and some nausea no vomiting. He has not been taking his medication for blood pressure. He had no chest pain and no pressure. He was very sweaty during this episode. Patient has self reported problems with anxiety.  He have had extensive recent stressors.   Cardiac history does include NSTEMI with pk troponin of 3.11 in Feb. 2011 with cath revealing subtotally occluded 1st diag branch of LAD -culprit vessel with TIMI 3 flow.  EF 55%. The diag vessel was not amenable to stenting due to small caliber and diffuse disease.  Pt was treated medically.  Other vessels with mild diffuse disease.  Now  Troponin has risen to 0.62, Pro BNP on admit 1846.  Pt is negative 847 ml.  BP with improved control 152/80.  EKG now with flipped t waves in ant. Leads new from admit.  ? Occl. Of diagonal.   Past Medical History  Diagnosis Date  . Diabetes mellitus     Type II  . Hyperlipemia   . Hypertension   . Pancreatitis   . Anxiety   . Hepatic lesion 02/04/11  . GERD (gastroesophageal reflux disease)   . CAD (coronary artery disease)     last cath by [facility] DR.  Qadir Folks showing  some  disease involving LCX and small size of Diag   . NSTEMI (non-ST elevated myocardial infarction) 11/21/2009     Past Surgical History  Procedure Date  . Rectal surgery     cyst  . Cardiac catheterization 2011    minimal disease, medical management    Family History  Problem Relation Age of Onset  . Diabetes Paternal Grandmother   . Diabetes Maternal Grandfather   . Colon cancer Neg Hx    Social History:  reports that he has never smoked. He has never used smokeless tobacco. He reports that he does not drink alcohol or use illicit drugs.  Allergies: No Known Allergies  Medications Prior to Admission  Medication Sig Dispense Refill  . ACCU-CHEK AVIVA test strip TEST 2 TIMES A DAY  100 each  5  . aspirin 81 MG tablet Take 81 mg by mouth daily.        . BD ULTRA-FINE PEN NEEDLES 29G X 12.7MM MISC USE 4 TIMES A DAY  120 each  4  . Blood Glucose Monitoring Suppl (ACCU-CHEK ACTIVE CARE KIT) KIT by Does not apply route.        . insulin detemir (LEVEMIR) 100 UNIT/ML injection Inject 40-60 Units into the skin 2 (two) times daily. 60 units in the morning and 40 units in the evening      . Lancets (ACCU-CHEK MULTICLIX) lancets 1 each by Other route as directed. Use as instructed       .  lipase/protease/amylase (CREON-10/PANCREASE) 12000 UNITS CPEP Take 1-2 capsules by mouth as directed. 2 capsules with meals and 1 capsule with snacks      . Multiple Vitamin (MULTIVITAMIN) capsule Take 1 capsule by mouth daily.        Marland Kitchen omeprazole (PRILOSEC) 20 MG capsule Take 1 tablet by mouth once every night.  90 capsule  0  . quinapril-hydrochlorothiazide (ACCURETIC) 20-12.5 MG per tablet 2 tabs daily  60 tablet  11    Results for orders placed during the hospital encounter of 07/13/12 (from the past 48 hour(s))  GLUCOSE, CAPILLARY     Status: Abnormal   Collection Time   07/13/12 10:58 PM      Component Value Range Comment   Glucose-Capillary 291 (*) 70 - 99 mg/dL   CBC     Status: Abnormal   Collection Time   07/13/12 11:19 PM      Component Value Range Comment   WBC 9.4  4.0 - 10.5 K/uL    RBC 4.35   4.22 - 5.81 MIL/uL    Hemoglobin 13.5  13.0 - 17.0 g/dL    HCT 78.2 (*) 95.6 - 52.0 %    MCV 88.3  78.0 - 100.0 fL    MCH 31.0  26.0 - 34.0 pg    MCHC 35.2  30.0 - 36.0 g/dL    RDW 21.3  08.6 - 57.8 %    Platelets 256  150 - 400 K/uL   BASIC METABOLIC PANEL     Status: Abnormal   Collection Time   07/13/12 11:19 PM      Component Value Range Comment   Sodium 139  135 - 145 mEq/L    Potassium 3.5  3.5 - 5.1 mEq/L    Chloride 100  96 - 112 mEq/L    CO2 29  19 - 32 mEq/L    Glucose, Bld 329 (*) 70 - 99 mg/dL    BUN 14  6 - 23 mg/dL    Creatinine, Ser 4.69  0.50 - 1.35 mg/dL    Calcium 9.0  8.4 - 62.9 mg/dL    GFR calc non Af Amer >90  >90 mL/min    GFR calc Af Amer >90  >90 mL/min   PRO B NATRIURETIC PEPTIDE     Status: Abnormal   Collection Time   07/13/12 11:19 PM      Component Value Range Comment   Pro B Natriuretic peptide (BNP) 1846.0 (*) 0 - 125 pg/mL   PROTIME-INR     Status: Normal   Collection Time   07/13/12 11:19 PM      Component Value Range Comment   Prothrombin Time 14.4  11.6 - 15.2 seconds    INR 1.14  0.00 - 1.49   POCT I-STAT TROPONIN I     Status: Normal   Collection Time   07/13/12 11:32 PM      Component Value Range Comment   Troponin i, poc 0.02  0.00 - 0.08 ng/mL    Comment 3            MRSA PCR SCREENING     Status: Normal   Collection Time   07/14/12  5:16 AM      Component Value Range Comment   MRSA by PCR NEGATIVE  NEGATIVE   TROPONIN I     Status: Abnormal   Collection Time   07/14/12  5:40 AM      Component Value Range Comment   Troponin I 0.62 (*) <  0.30 ng/mL   CBC     Status: Abnormal   Collection Time   07/14/12  5:40 AM      Component Value Range Comment   WBC 6.8  4.0 - 10.5 K/uL    RBC 4.04 (*) 4.22 - 5.81 MIL/uL    Hemoglobin 12.3 (*) 13.0 - 17.0 g/dL    HCT 16.1 (*) 09.6 - 52.0 %    MCV 87.4  78.0 - 100.0 fL    MCH 30.4  26.0 - 34.0 pg    MCHC 34.8  30.0 - 36.0 g/dL    RDW 04.5  40.9 - 81.1 %    Platelets 228  150 - 400  K/uL   CREATININE, SERUM     Status: Normal   Collection Time   07/14/12  5:40 AM      Component Value Range Comment   Creatinine, Ser 0.74  0.50 - 1.35 mg/dL    GFR calc non Af Amer >90  >90 mL/min    GFR calc Af Amer >90  >90 mL/min    Dg Chest Port 1 View  07/13/2012  *RADIOLOGY REPORT*  Clinical Data: Shortness of breath, tachycardia, and sweating.  PORTABLE CHEST - 1 VIEW  Comparison: 11/21/2009  Findings: Shallow inspiration.  Cardiac enlargement with pulmonary vascular congestion.  Interstitial changes in the lung bases consistent with edema.  Changes are progressing since previous study.  No blunting of costophrenic angles.  No pneumothorax.  IMPRESSION: Cardiac enlargement with increased pulmonary vascular congestion and developing edema since prior study.   Original Report Authenticated By: Marlon Pel, M.D.     ROS: General:no recent colds or fevers, no chest pressure or DOE Skin:no ulcers, rash on lt. Arm below elbow HEENT:no blurred vision CV:no chest pain BJY:NWGNFA SOB last pm GI:no diarrhea, constipation no melena GU:no hematuria no dysuria MS:no arthritic pain Neuro:no lightheadedness no syncope Endo:+ DM controlled, no thyroid disease but TSH pending   Blood pressure 152/80, pulse 97, temperature 98.2 F (36.8 C), temperature source Oral, resp. rate 23, height 6' (1.829 m), weight 87 kg (191 lb 12.8 oz), SpO2 94.00%. PE: General:alert and oriented, pleasant affect, afraid of lasix because his 78 yr old uncle died of renal failure on lasix, anxious Skin:warm and dry, rash on Lt arm appears to be from sun, upper arm without rash only on sun exposed area HEENT:normocephalic, sclarea clear Neck:supple,  no bruits Heart:S1S2 RRR +S3 gallup HR 110 Lungs:fine crackle in bases Abd:+ BS, soft, non tender Ext:no edema Neuro:alert and oriented X 3 MAE, follows commands.    Assessment/Plan Active Problems:  DIABETES MELLITUS, TYPE II  HYPERLIPIDEMIA  ANXIETY   HYPERTENSION  CHF (congestive heart failure)  Pulmonary edema  Respiratory distress  Hypertensive urgency  CAD (coronary artery disease), subtotalled 1st diag too smal for PCI and mild LCX and LAD disease 10/2009  PLAN: Elevated troponin could be demand ischemia with known subtotalled 1st diag, and Pul edema most likely related to HTN and diastolic dysfunction.  Though EKG this am appears to be evolving ant. MI.  MD to eval. For cardiac cath.  Pt was not taking his accuretic prior to admit, believes it was lost/forgotten when he changed pharmacies.  NTG at 10 mcg 2D echo just completed.  INGOLD,LAURA R 07/14/2012, 9:19 AM    I have seen and examined the patient along with Nada Boozer, NP.  I have reviewed the chart, notes and new data.  I agree with NP's note.  Key new complaints:  breathing greatly improved, no angina Key examination changes: loud S3, tachycardia Key new findings / data: echo shows drop in EF and a suggestion of regional abnormalities, although there is also diffuse global hypokinesis. Severe anterior T wave inversion.  PLAN: Suspect new stenosis in the LAD distribution with acute coronary syndrome (so far unstable angina, may be a small NSTEMI) and secondary heart failure.  Urgent cardiac cath and coronary angio with possible PCI stent. This procedure has been fully reviewed with the patient and written informed consent has been obtained.  Note that he was inadvertently off antihypertensive/ACEi therapy. Differential diagnosis is acute decompensation due to severe hypertension. Acute coronary insufficiency appears more likely.  Thurmon Fair, MD, [facility] [phone] 07/14/2012, 11:59 AM

## 2012-07-14 NOTE — Progress Notes (Signed)
Pt admitted by [facility], on behalf of [facility].  Current active issues being addressed by [facility].  [facility] has changed attending to their service.    [facility] will sign off.  Please feel free to call if you need further assistance with any active medical issues.  Thanks,  Lonia Blood, MD [facility] Office  [phone] Pager [phone]  On-Call/Text Page:      [email or web address]      password [email or web address]

## 2012-07-14 NOTE — Progress Notes (Signed)
Site area: right groin  Site Prior to Removal:  Level 0  Pressure Applied For 20 MINUTES    Minutes Beginning at 1800  Manual:   yes  Patient Status During Pull:  stable  Post Pull Groin Site:  Level 0  Post Pull Instructions Given:  yes  Post Pull Pulses Present:  yes  Dressing Applied:  yes  Comments:    

## 2012-07-14 NOTE — ED Notes (Signed)
Cardiologist at bedside.  

## 2012-07-14 NOTE — Progress Notes (Signed)
Consent signed for cardiac cath lab. NPO.Marland KitchenMarland KitchenLevmir 1/ 2 dose given. Lovenox held per Nada Boozer, NP.

## 2012-07-14 NOTE — ED Notes (Signed)
MD at bedside. 

## 2012-07-14 NOTE — Progress Notes (Signed)
CRITICAL VALUE ALERT  Critical value received:  Trop 0.62  Date of notification:  07/14/12  Time of notification:  0647  Critical value read back:yes  Nurse who received alert:  Adria Dill  MD notified (1st page):  [place] PA  Time of first page:  0650  MD notified (2nd page):  Time of second page:  Responding MD:  Harduk PA  Time MD responded:  [phone]

## 2012-07-14 NOTE — H&P (Signed)
PCP:   Evette Georges, MD  Cardiologist: Bella Kennedy GIJuanda Chance  Chief Complaint:   Shortness of breath  HPI: David Lane is a 66 y.o. male   has a past medical history of Diabetes mellitus; Hyperlipemia; Hypertension; Pancreatitis; Anxiety; Hepatic lesion (02/04/11); and GERD (gastroesophageal reflux disease).   Presented with  He ate some shrimp last night and had hard time sleeping he had shortness of breath when he tried to lay down flat. While watching TV he became clammy an dvery short of breath.  He checked his blood pressure and it was 240's and he called for EMS and he presented to ER he was found to have elevated BP up to 193/126.  He have and some nausea no vomiting. He has not been taking his medication for blood pressure. He had no chest pain and no pressure. He was very sweaty during this episode. Patient has self reported problems with anxiety.   He have had extensive recent stressors. He does have hx of mild CAD dating to 2011 that at the time has not been amendable to stenting.   Review of Systems:    Pertinent positives include: shortness of breath at rest, nausea, orthopnea  Constitutional:  No weight loss, night sweats, Fevers, chills, fatigue, weight loss  HEENT:  No headaches, Difficulty swallowing,Tooth/dental problems,Sore throat,  No sneezing, itching, ear ache, nasal congestion, post nasal drip,  Cardio-vascular:  No chest pain, Orthopnea, PND, anasarca, dizziness, palpitations.no Bilateral lower extremity swelling  GI:  No heartburn, indigestion, abdominal pain, vomiting, diarrhea, change in bowel habits, loss of appetite, melena, blood in stool, hematemesis Resp:  no . No dyspnea on exertion, No excess mucus, no productive cough, No non-productive cough, No coughing up of blood.No change in color of mucus.No wheezing. Skin:  no rash or lesions. No jaundice GU:  no dysuria, change in color of urine, no urgency or frequency. No straining to urinate.    No flank pain.  Musculoskeletal:  No joint pain or no joint swelling. No decreased range of motion. No back pain.  Psych:  No change in mood or affect. No depression or anxiety. No memory loss.  Neuro: no localizing neurological complaints, no tingling, no weakness, no double vision, no gait abnormality, no slurred speech, no confusion  Otherwise ROS are negative except for above, 10 systems were reviewed  Past Medical History: Past Medical History  Diagnosis Date  . Diabetes mellitus     Type II  . Hyperlipemia   . Hypertension   . Pancreatitis   . Anxiety   . Hepatic lesion 02/04/11  . GERD (gastroesophageal reflux disease)    Past Surgical History  Procedure Date  . Rectal surgery     cyst     Medications: Prior to Admission medications   Medication Sig Start Date End Date Taking? Authorizing Provider  ACCU-CHEK AVIVA test strip TEST 2 TIMES A DAY 10/12/11  Yes Romero Belling, MD  aspirin 81 MG tablet Take 81 mg by mouth daily.     Yes Historical Provider, MD  BD ULTRA-FINE PEN NEEDLES 29G X 12.7MM MISC USE 4 TIMES A DAY 03/10/11  Yes Romero Belling, MD  Blood Glucose Monitoring Suppl (ACCU-CHEK ACTIVE CARE KIT) KIT by Does not apply route.     Yes Historical Provider, MD  insulin detemir (LEVEMIR) 100 UNIT/ML injection Inject 40-60 Units into the skin 2 (two) times daily. 60 units in the morning and 40 units in the evening   Yes Historical Provider, MD  Lancets (ACCU-CHEK MULTICLIX) lancets 1 each by Other route as directed. Use as instructed    Yes Historical Provider, MD  lipase/protease/amylase (CREON-10/PANCREASE) 12000 UNITS CPEP Take 1-2 capsules by mouth as directed. 2 capsules with meals and 1 capsule with snacks   Yes Historical Provider, MD  Multiple Vitamin (MULTIVITAMIN) capsule Take 1 capsule by mouth daily.     Yes Historical Provider, MD  omeprazole (PRILOSEC) 20 MG capsule Take 1 tablet by mouth once every night. 09/06/11  Yes Hart Carwin, MD   quinapril-hydrochlorothiazide (ACCURETIC) 20-12.5 MG per tablet 2 tabs daily 08/27/11  Yes Romero Belling, MD    Allergies:  No Known Allergies  Social History:  Ambulatory independently  Lives at  home   reports that he has never smoked. He has never used smokeless tobacco. He reports that he does not drink alcohol or use illicit drugs.   Family History: family history includes Diabetes in his maternal grandfather and paternal grandmother.  There is no history of Colon cancer.    Physical Exam: Patient Vitals for the past 24 hrs:  BP Pulse Resp SpO2  07/14/12 0100 170/95 mmHg 99  23  95 %  07/14/12 0030 166/88 mmHg 103  25  93 %  07/13/12 2300 193/126 mmHg 117  23  91 %    1. General:  in No Acute distress 2. Psychological: Alert and  Oriented 3. Head/ENT:   Moist  Mucous Membranes                          Head Non traumatic, neck supple                          Normal   Dentition 4. SKIN: normal   Skin turgor,  Skin clean Dry and intact no rash 5. Heart: Regular rate and rhythm no Murmur, Rub or gallop 6. Lungs: Clear to auscultation bilaterally, no wheezes or crackles   7. Abdomen: Soft, non-tender, Non distended 8. Lower extremities: no clubbing, cyanosis, or edema 9. Neurologically Grossly intact, moving all 4 extremities equally 10. MSK: Normal range of motion  body mass index is unknown because there is no height or weight on file.   Labs on Admission:   [facility] 07/13/12 2319  NA 139  K 3.5  CL 100  CO2 29  GLUCOSE 329*  BUN 14  CREATININE 0.75  CALCIUM 9.0  MG --  PHOS --   No results found for this basename: AST:2,ALT:2,ALKPHOS:2,BILITOT:2,PROT:2,ALBUMIN:2 in the last 72 hours No results found for this basename: LIPASE:2,AMYLASE:2 in the last 72 hours  Basename 07/13/12 2319  WBC 9.4  NEUTROABS --  HGB 13.5  HCT 38.4*  MCV 88.3  PLT 256   No results found for this basename: CKTOTAL:3,CKMB:3,CKMBINDEX:3,TROPONINI:3 in the last 72 hours No  results found for this basename: TSH,T4TOTAL,FREET3,T3FREE,THYROIDAB in the last 72 hours No results found for this basename: VITAMINB12:2,FOLATE:2,FERRITIN:2,TIBC:2,IRON:2,RETICCTPCT:2 in the last 72 hours Lab Results  Component Value Date   HGBA1C 12.4* 08/27/2011    The CrCl is unknown because both a height and weight (above a minimum accepted value) are required for this calculation. ABG    Component Value Date/Time   TCO2 29 11/21/2009 1330     No results found for this basename: DDIMER     Other results:  I have pearsonaly reviewed this: ECG REPORT  Rate: 113  Rhythm: sinus tachycardia ST&T Change: minimal St depression  in lateral and inferior leads.   BNP 1846  Cultures: No results found for this basename: sdes, specrequest, cult, reptstatus       Radiological Exams on Admission: Dg Chest Port 1 View  07/13/2012  *RADIOLOGY REPORT*  Clinical Data: Shortness of breath, tachycardia, and sweating.  PORTABLE CHEST - 1 VIEW  Comparison: 11/21/2009  Findings: Shallow inspiration.  Cardiac enlargement with pulmonary vascular congestion.  Interstitial changes in the lung bases consistent with edema.  Changes are progressing since previous study.  No blunting of costophrenic angles.  No pneumothorax.  IMPRESSION: Cardiac enlargement with increased pulmonary vascular congestion and developing edema since prior study.   Original Report Authenticated By: Marlon Pel, M.D.     Chart has been reviewed  Assessment/Plan  This is a 66 year old gentleman with prior history of coronary artery disease and diabetes presents with sudden onset of flash pulmonary edema and severe malignant hypertension. His cardiac markers so far negative he has diuresed well  while in emergency department currently on nitroglycerin drip Symptomatically greatly improved.  Present on Admission:  .Pulmonary edema - now improved we'll admit to step down on nitro drip, Lasix, make sure that he has an  echogram in The morning,  would also consult [facility] cardiology as they have been involved in his care 2011. For now will continue to cycle cardiac markers.  Marland KitchenHYPERTENSION - we'll make sure patient is an ACE inhibitor and beta blocker control his blood pressure have a nitroglycerin drip  .DIABETES MELLITUS, TYPE II -sliding scale continue levomir   .ANXIETY -when necessary Xanax  .CHF (congestive heart failure) - 7 onset of pulmonary edema a chest x-ray consistent with above. Will obtain echogram in the morning and give Lasix throughout the day.  Marland KitchenRespiratory distress - secondary to pulmonary edema and now improved will admit to step down  .Hypertensive urgency - nitroglycerin drip to control his blood pressure is currently down to 140/78. Hopefully we'll be able to wean and restart his home medications he hasn't been taking them as supposed lately.   Prophylaxis:   Lovenox, Protonix  CODE STATUS: FULL CODE  Other plan as per orders.  I have spent a total of 55 min on this admission  Drezden Seitzinger 07/14/2012, 2:21 AM

## 2012-07-14 NOTE — Progress Notes (Signed)
  Echocardiogram 2D Echocardiogram has been performed.  Qunicy Higinbotham 07/14/2012, 10:13 AM

## 2012-07-14 NOTE — ED Provider Notes (Addendum)
History     CSN: [id number]  Arrival date & time 07/13/12  2243   First MD Initiated Contact with Patient 07/13/12 2317      Chief Complaint  Patient presents with  . Shortness of Breath    (Consider location/radiation/quality/duration/timing/severity/associated sxs/prior treatment) HPI  David Lane is a 66 year old man with diabetes, hypertension and hyperlipidemia. He denies history of coronary artery disease.  He presents today with complaints of 24 hours of orthopnea and dyspnea on exertion. He was unable to sleep last night secondary to orthopnea. This evening, while watching television, about one hour prior to presentation, he developed diffuse diaphoresis. However, he denies any associated chest pain, heaviness, neck or arm pain. He denies any history of similar symptoms. This was noted to be significantly hypertensive on presentation to the emergency department with an initial blood pressure of 220/120. Paramedics reported that he had a pulse in the 140s. The patient's blood pressure has improved to 187 her 1:15 at the time of my exam, without any intervention.  Patient denies history of CHF. Denies history of dyspnea. She has no history of COPD or tobacco use.  Past Medical History  Diagnosis Date  . Diabetes mellitus     Type II  . Hyperlipemia   . Hypertension   . Pancreatitis   . Anxiety   . Hepatic lesion 02/04/11  . GERD (gastroesophageal reflux disease)     Past Surgical History  Procedure Date  . Rectal surgery     cyst  . Cardiac catheterization 2006    minimal disease    Family History  Problem Relation Age of Onset  . Diabetes Paternal Grandmother   . Diabetes Maternal Grandfather   . Colon cancer Neg Hx     History  Substance Use Topics  . Smoking status: Never Smoker   . Smokeless tobacco: Never Used  . Alcohol Use: No      Review of Systems  Gen: no weight loss, fevers, chills, night sweats, diaphoresis as noted above Eyes: no  discharge or drainage, no occular pain or visual changes Nose: no epistaxis or rhinorrhea Mouth: no dental pain, no sore throat Neck: no neck pain Lungs: as per hpi, ortherwise negative CV: As per history of present illness, otherwise negative Abd: no abdominal pain, nausea, vomiting GU: no dysuria or gross hematuria MSK: no myalgias or arthralgias Neuro: no headache, no focal neurologic deficits Skin: no rash Psyche: negative.  Allergies  Review of patient's allergies indicates no known allergies.  Home Medications   Current Outpatient Rx  Name Route Sig Dispense Refill  . ACCU-CHEK AVIVA VI STRP  TEST 2 TIMES A DAY 100 each 5  . ASPIRIN 81 MG PO TABS Oral Take 81 mg by mouth daily.      . BD PEN NEEDLE ULTRAFINE 29G X 12.7MM MISC  USE 4 TIMES A DAY 120 each 4  . ACCU-CHEK ACTIVE CARE KIT KIT Does not apply by Does not apply route.      . INSULIN DETEMIR 100 UNIT/ML [place] SOLN Subcutaneous Inject 40-60 Units into the skin 2 (two) times daily. 60 units in the morning and 40 units in the evening    . ACCU-CHEK MULTICLIX LANCETS MISC Other 1 each by Other route as directed. Use as instructed     . PANCRELIPASE (LIP-PROT-AMYL) 12000 UNITS PO CPEP Oral Take 1-2 capsules by mouth as directed. 2 capsules with meals and 1 capsule with snacks    . MULTIVITAMINS PO CAPS Oral Take 1  capsule by mouth daily.      Marland Kitchen OMEPRAZOLE 20 MG PO CPDR  Take 1 tablet by mouth once every night. 90 capsule 0  . QUINAPRIL-HYDROCHLOROTHIAZIDE 20-12.5 MG PO TABS  2 tabs daily 60 tablet 11    BP 170/95  Pulse 99  Resp 23  SpO2 95%  Physical Exam  Gen: well developed and well nourished appearing Head: NCAT Eyes: PERL, EOMI Nose: no epistaixis or rhinorrhea Mouth/throat: mucosa is moist and pink Neck: supple, no stridor Lungs: tachypnea with respiratory distress, bibasilar rales CV: RRR, no murmur, extemities well perfused Abd: soft, notender, nondistended Back: no ttp, no cva ttp Skin: no rashese,  wnl Neuro: CN ii-xii grossly intact, no focal deficits Psyche; normal affect,  calm and cooperative.  ED Course  Procedures (including critical care time)  Labs Reviewed  GLUCOSE, CAPILLARY - Abnormal; Notable for the following:    Glucose-Capillary 291 (*)     All other components within normal limits  CBC - Abnormal; Notable for the following:    HCT 38.4 (*)     All other components within normal limits  BASIC METABOLIC PANEL - Abnormal; Notable for the following:    Glucose, Bld 329 (*)     All other components within normal limits  PRO B NATRIURETIC PEPTIDE - Abnormal; Notable for the following:    Pro B Natriuretic peptide (BNP) 1846.0 (*)     All other components within normal limits  PROTIME-INR  POCT I-STAT TROPONIN I   Dg Chest Port 1 View  07/13/2012  *RADIOLOGY REPORT*  Clinical Data: Shortness of breath, tachycardia, and sweating.  PORTABLE CHEST - 1 VIEW  Comparison: 11/21/2009  Findings: Shallow inspiration.  Cardiac enlargement with pulmonary vascular congestion.  Interstitial changes in the lung bases consistent with edema.  Changes are progressing since previous study.  No blunting of costophrenic angles.  No pneumothorax.  IMPRESSION: Cardiac enlargement with increased pulmonary vascular congestion and developing edema since prior study.   Original Report Authenticated By: Marlon Pel, M.D.      1. Hypertensive crisis   2. Congestive heart failure   3. Acute pulmonary edema   4. Diabetes mellitus    CRITICAL CARE Performed by: Brandt Loosen   Total critical care time: 75m  Critical care time was exclusive of separately billable procedures and treating other patients.  Critical care was necessary to treat or prevent imminent or life-threatening deterioration.  Critical care was time spent personally by me on the following activities: development of treatment plan with patient and/or surrogate as well as nursing, discussions with consultants,  evaluation of patient's response to treatment, examination of patient, obtaining history from patient or surrogate, ordering and performing treatments and interventions, ordering and review of laboratory studies, ordering and review of radiographic studies, pulse oximetry and re-evaluation of patient's condition.    MDM    Patient with hypertensive crisis and new onset pulmonary edema. We are treating with NTG gtt and Lasix IV along with supportive 02.  Case discussed with hospitalist who will see and admit.       Brandt Loosen, MD 07/14/12 1914  Brandt Loosen, MD 08/13/12 [id number]

## 2012-07-15 LAB — GLUCOSE, CAPILLARY
Glucose-Capillary: 95 mg/dL (ref 70–99)
Glucose-Capillary: 95 mg/dL (ref 70–99)
Glucose-Capillary: 213 mg/dL — ABNORMAL HIGH (ref 70–99)
Glucose-Capillary: 209 mg/dL — ABNORMAL HIGH (ref 70–99)
Glucose-Capillary: 96 mg/dL (ref 70–99)

## 2012-07-15 LAB — BASIC METABOLIC PANEL
Calcium: 8.2 mg/dL — ABNORMAL LOW (ref 8.4–10.5)
GFR calc Af Amer: >90 mL/min (ref >90)
GFR calc non Af Amer: >90 mL/min (ref >90)
Sodium: 141 mEq/L (ref 135–145)

## 2012-07-15 LAB — CBC
MCH: 30.9 pg (ref 26.0–34.0)
MCHC: 34.9 g/dL (ref 30.0–36.0)
Platelets: 207 10*3/uL (ref 150–400)

## 2012-07-15 MED ORDER — FUROSEMIDE 40 MG PO TABS
40.0000 mg | ORAL_TABLET | Freq: Every day | ORAL | Status: DC
  Administered 2012-07-15 – 2012-07-16 (×2): 40 mg via ORAL
  Filled 2012-07-15 (×2): qty 1

## 2012-07-15 MED ORDER — POTASSIUM CHLORIDE CRYS ER 20 MEQ PO TBCR
40.0000 meq | EXTENDED_RELEASE_TABLET | Freq: Once | ORAL | Status: DC
  Filled 2012-07-15: qty 2

## 2012-07-15 MED ORDER — POTASSIUM CHLORIDE CRYS ER 20 MEQ PO TBCR
EXTENDED_RELEASE_TABLET | ORAL | Status: AC
  Administered 2012-07-15: 40 meq
  Filled 2012-07-15: qty 2

## 2012-07-15 MED ORDER — CARVEDILOL 6.25 MG PO TABS
6.2500 mg | ORAL_TABLET | Freq: Two times a day (BID) | ORAL | Status: DC
  Administered 2012-07-15 – 2012-07-16 (×2): 6.25 mg via ORAL
  Filled 2012-07-15 (×6): qty 1

## 2012-07-15 NOTE — Plan of Care (Signed)
Problem: Phase II Progression Outcomes Goal: Begin discharge teaching Outcome: Completed/Met Date Met:  07/15/12 Reviewed information regarding groin site care, Right groin level 0, no swelling or hematoma. Gauze dressing removed and bandaid applied this am.

## 2012-07-15 NOTE — Progress Notes (Signed)
[facility]  Subjective: He appears anxious and is wondering if he is.  He is rambling.    Objective: Vital signs in last 24 hours: Temp:  [98.1 F (36.7 C)-98.3 F (36.8 C)] 98.3 F (36.8 C) (10/19 0450) Pulse Rate:  [79-95] 83  (10/19 0450) Resp:  [16-33] 33  (10/19 0600) BP: (114-167)/(66-94) 157/89 mmHg (10/19 0600) SpO2:  [88 %-99 %] 92 % (10/19 0450) Weight:  [87.363 kg (192 lb 9.6 oz)] 87.363 kg (192 lb 9.6 oz) (10/19 0500) Last BM Date: 07/13/12  Intake/Output from previous day: 10/18 0701 - 10/19 0700 In: 590 [P.O.:440; I.V.:150] Out: 1225 [Urine:1225] Intake/Output this shift:    Medications Current Facility-Administered Medications  Medication Dose Route Frequency Provider Last Rate Last Dose  . 0.9 %  sodium chloride infusion   Intravenous Continuous Runell Gess, MD 75 mL/hr at 07/14/12 2200    . a stronger pump book   Does not apply Once Runell Gess, MD      . acetaminophen (TYLENOL) tablet 650 mg  650 mg Oral Q4H PRN Therisa Doyne, MD      . ALPRAZolam Prudy Feeler) tablet 0.25 mg  0.25 mg Oral BID PRN Therisa Doyne, MD      . angioplasty book   Does not apply Once Runell Gess, MD      . aspirin chewable tablet 243 mg  243 mg Oral Pre-Cath Kendra P Hiatt, PHARMD   243 mg at 07/14/12 1302  . aspirin EC tablet 81 mg  81 mg Oral Daily Therisa Doyne, MD   81 mg at 07/14/12 1042  . bivalirudin (ANGIOMAX) 250 MG injection           . diazepam (VALIUM) tablet 5 mg  5 mg Oral On Call Nada Boozer, NP   5 mg at 07/14/12 1359  . fentaNYL (SUBLIMAZE) 0.05 MG/ML injection           . heparin 2-0.9 UNIT/ML-% infusion           . heparin 2-0.9 UNIT/ML-% infusion           . insulin aspart (novoLOG) injection 0-15 Units  0-15 Units Subcutaneous TID WC Therisa Doyne, MD   3 Units at 07/14/12 1933  . insulin aspart (novoLOG) injection 0-5 Units  0-5 Units Subcutaneous QHS Therisa Doyne, MD      . insulin detemir  (LEVEMIR) injection 40 Units  40 Units Subcutaneous QHS Therisa Doyne, MD   40 Units at 07/14/12 2200  . insulin detemir (LEVEMIR) injection 60 Units  60 Units Subcutaneous Daily Therisa Doyne, MD   30 Units at 07/14/12 1125  . lidocaine (XYLOCAINE) 1 % injection           . lipase/protease/amylase (CREON-10/PANCREASE) capsule 1 capsule  1 capsule Oral PRN Therisa Doyne, MD   1 capsule at 07/14/12 0823  . lipase/protease/amylase (CREON-10/PANCREASE) capsule 2 capsule  2 capsule Oral TID WC Therisa Doyne, MD   2 capsule at 07/14/12 1935  . lisinopril (PRINIVIL,ZESTRIL) tablet 40 mg  40 mg Oral Daily Therisa Doyne, MD   40 mg at 07/14/12 1043  . living well with diabetes book MISC   Does not apply Once Runell Gess, MD      . metoprolol tartrate (LOPRESSOR) tablet 12.5 mg  12.5 mg Oral BID Therisa Doyne, MD   12.5 mg at 07/14/12 2120  . midazolam (VERSED) 2 MG/2ML injection           .  morphine 2 MG/ML injection 1 mg  1 mg Intravenous Q1H PRN Runell Gess, MD   1 mg at 07/14/12 1805  . nitroGLYCERIN (NTG ON-CALL) 0.2 mg/mL injection           . ondansetron (ZOFRAN) injection 4 mg  4 mg Intravenous Q6H PRN Therisa Doyne, MD      . pantoprazole (PROTONIX) EC tablet 40 mg  40 mg Oral Daily Therisa Doyne, MD   40 mg at 07/14/12 1043  . potassium chloride SA (K-DUR,KLOR-CON) CR tablet 40 mEq  40 mEq Oral Once Nada Boozer, NP   40 mEq at 07/14/12 2120  . Ticagrelor (BRILINTA) 90 MG tablet           . Ticagrelor (BRILINTA) tablet 90 mg  90 mg Oral BID Runell Gess, MD      . DISCONTD: 0.9 %  sodium chloride infusion  250 mL Intravenous PRN Therisa Doyne, MD      . DISCONTD: 0.9 %  sodium chloride infusion  250 mL Intravenous PRN Nada Boozer, NP      . DISCONTD: 0.9 %  sodium chloride infusion   Intravenous Continuous Nada Boozer, NP      . DISCONTD: acetaminophen (TYLENOL) tablet 650 mg  650 mg Oral Q4H PRN Runell Gess, MD      . DISCONTD:  aspirin chewable tablet 324 mg  324 mg Oral Pre-Cath Nada Boozer, NP      . DISCONTD: aspirin chewable tablet 81 mg  81 mg Oral Daily Runell Gess, MD      . DISCONTD: enoxaparin (LOVENOX) injection 40 mg  40 mg Subcutaneous Daily Therisa Doyne, MD      . DISCONTD: furosemide (LASIX) injection 20 mg  20 mg Intravenous BID Therisa Doyne, MD   20 mg at 07/14/12 0927  . DISCONTD: nitroGLYCERIN 0.2 mg/mL in dextrose 5 % infusion  10-200 mcg/min Intravenous Titrated Therisa Doyne, MD   10 mcg/min at 07/14/12 0028  . DISCONTD: ondansetron (ZOFRAN) injection 4 mg  4 mg Intravenous Q6H PRN Runell Gess, MD      . DISCONTD: sodium chloride 0.9 % injection 3 mL  3 mL Intravenous Q12H Therisa Doyne, MD      . DISCONTD: sodium chloride 0.9 % injection 3 mL  3 mL Intravenous PRN Therisa Doyne, MD      . DISCONTD: sodium chloride 0.9 % injection 3 mL  3 mL Intravenous Q12H Nada Boozer, NP      . DISCONTD: sodium chloride 0.9 % injection 3 mL  3 mL Intravenous PRN Nada Boozer, NP        PE: General appearance: alert, cooperative and no distress Lungs: clear to auscultation bilaterally Heart: regular rate and rhythm, S1, S2 normal, no murmur, click, rub or gallop Extremities: No LEE Pulses: 2+ and symmetric Neurologic: Grossly normal  Lab Results:   Basename 07/15/12 0425 07/14/12 1032 07/14/12 0540  WBC 6.4 6.4 6.8  HGB 12.4* 12.9* 12.3*  HCT 35.5* 36.7* 35.3*  PLT 207 240 228   BMET  Basename 07/15/12 0425 07/14/12 1032 07/14/12 0540 07/13/12 2319  NA 141 141 -- 139  K 3.1* 3.2* -- 3.5  CL 102 99 -- 100  CO2 34* 34* -- 29  GLUCOSE 161* 281* -- 329*  BUN 16 14 -- 14  CREATININE 0.66 0.62 0.74 --  CALCIUM 8.2* 8.8 -- 9.0   PT/INR  Basename 07/14/12 1032 07/13/12 2319  LABPROT 15.2 14.4  INR 1.22 1.14  Studies/Results: HEMODYNAMICS:  AO SYSTOLIC/AO DIASTOLIC: 125/73  IMPRESSION:successful PCI and stenting of mid LAD and PDA off the dominant  circumflex with expedition drug-eluting stents, Angiomax, and Brilenta . The patient tolerated the procedure well. He left the Cath Lab in stable condition. The sheath will be removed in several hours her pressure will be held on the groin to achieve hemostasis. Patient will be gently hydrated overnight and discharged home in the morning.  Runell Gess MD, [facility]  07/14/2012  Assessment/Plan   Principal Problem:  *Pulmonary edema, most likely due to diastolic dysfunction Active Problems:  DIABETES MELLITUS, TYPE II  HYPERLIPIDEMIA  ANXIETY  HYPERTENSION  CHF (congestive heart failure)  Respiratory distress  Hypertensive urgency  CAD (coronary artery disease), subtotalled 1st diag too smal for PCI and mild LCX and LAD disease 10/2009  Plan:  S/P successful PCI and stenting of the mid LAD and PDA with DES.  Replacing potassium.  Slightly low.   ASA, Brilinta, lisinopril, lopressor.  BNP up to > 3000 from 1800.  Adding Lasix 40mg  daily.  Likely will need PRN lais or lower daily dose at DC.   LOS: 2 days    HAGER, BRYAN 07/15/2012 8:13 AM   Agree with note written by Jones Skene PAC  S/P PCI Stent mid LAD and PDA (Dom LCX) yesterday . Admitted with HTN , NSTEMI, mild CHF with interstitial edema on CXR and increased BNP. No CP/SOB this AM. Exam benign. EKG shows ALTWI (new). Will change metop to coreg, add diuretic, Tx tele, ambulate, re check EKG, BNP and enz. Probably home AM.  Runell Gess 07/15/2012 8:41 AM

## 2012-07-15 NOTE — Progress Notes (Addendum)
CARDIAC REHAB PHASE I   PRE:  Rate/Rhythm: 89 sinus  BP:  Sitting: 122/72   SaO2: 94 RA  MODE:  Ambulation: 890 ft   POST:  Rate/Rhythem: 92  BP:  Sitting: 132/70     SaO2: 95 RA  Order received and appreciated.  Chart reviewed. Pt ambulated 890 ft with assist x1.  Pt tolerated walk well without complaint.  Pt returned to bed after walk for education with call bell in reach.  Reviewed diet, exercise, restrictions, books, CHF risk factors, and medication compliance.  Phase II Cardiac Rehab to be sent to [place].  Pt voiced understanding. Fabio Pierce, MA, ACSM RCEP [phone]  Hazle Nordmann

## 2012-07-15 NOTE — Progress Notes (Signed)
Received patient from 6500. Patient comfortable in bed with call bed in reach. No pain. Harlow Asa

## 2012-07-15 NOTE — Plan of Care (Signed)
Problem: Consults Goal: Heart Failure Patient Education (See Patient Education module for education specifics.)  Outcome: Progressing Reviewed with patient congestive heart failure booklet and given, patient stated he needed his sleep tonight and would review tomorrow. Goal: Diabetes Guidelines if Diabetic/Glucose > 140 If diabetic or lab glucose is > 140 mg/dl - Initiate Diabetes/Hyperglycemia Guidelines & Document Interventions  Outcome: Progressing Given living with diabetes booklet and reviewed need for education, needed to sleep and will review in am.  Problem: Phase I Progression Outcomes Goal: Dyspnea controlled at rest (HF) Outcome: Completed/Met Date Met:  07/15/12 Patient states he feels so much better, denies feeling short of breath tonight resting quietly, states "I don't have that shortness of breath I had when I came in." Goal: Pain controlled with appropriate interventions Outcome: Completed/Met Date Met:  07/15/12 Denies any pain or discomfort tonight Goal: EF % per last Echo/documented,Core Reminder form on chart Outcome: Completed/Met Date Met:  07/15/12 EF 35-40% by Echo 07/14/12 Goal: Up in chair, BRP Outcome: Not Applicable Date Met:  07/15/12 Patient on bedrest post cath/pci, unable to be up tonight Goal: Initial discharge plan identified Outcome: Completed/Met Date Met:  07/15/12 Planned discharge 10/19 if patient remains stable Goal: Voiding-avoid urinary catheter unless indicated Outcome: Completed/Met Date Met:  07/15/12 Voiding in sufficient quantities, amber colored urine  Goal: Hemodynamically stable Outcome: Completed/Met Date Met:  07/15/12 Stable monitor pattern SR with rare PAC's and one run of VT 4 beats, patient sleeping. Blood pressure stable.

## 2012-07-15 NOTE — Plan of Care (Signed)
Problem: Discharge Progression Outcomes Goal: Able to perform self care activities Outcome: Completed/Met Date Met:  07/15/12 Washing at sink independently. Up in room and to bathroom, tolerating activity well with no problems.

## 2012-07-16 LAB — GLUCOSE, CAPILLARY
Glucose-Capillary: 140 mg/dL — ABNORMAL HIGH (ref 70–99)
Glucose-Capillary: 124 mg/dL — ABNORMAL HIGH (ref 70–99)
Glucose-Capillary: 97 mg/dL (ref 70–99)

## 2012-07-16 LAB — TROPONIN I: Troponin I: 0.82 ng/mL (ref <0.30)

## 2012-07-16 MED ORDER — DEXTROSE 50 % IV SOLN
INTRAVENOUS | Status: AC
  Administered 2012-07-16: 50 mL
  Filled 2012-07-16: qty 50

## 2012-07-16 MED ORDER — TICAGRELOR 90 MG PO TABS: 90.0000 mg | ORAL_TABLET | Freq: Two times a day (BID) | ORAL | Status: DC

## 2012-07-16 MED ORDER — POTASSIUM CHLORIDE ER 10 MEQ PO TBCR: 10.0000 meq | EXTENDED_RELEASE_TABLET | Freq: Every day | ORAL | Status: DC

## 2012-07-16 MED ORDER — FUROSEMIDE 40 MG PO TABS: 40.0000 mg | ORAL_TABLET | Freq: Every day | ORAL | Status: DC

## 2012-07-16 MED ORDER — CARVEDILOL 6.25 MG PO TABS: 6.2500 mg | ORAL_TABLET | Freq: Two times a day (BID) | ORAL | Status: DC

## 2012-07-16 MED ORDER — ATORVASTATIN CALCIUM 20 MG PO TABS: 20.0000 mg | ORAL_TABLET | Freq: Every day | ORAL | Status: DC

## 2012-07-16 NOTE — Progress Notes (Signed)
Subjective:  No CP. C/O ? Mild orthopnea  Objective:  Temp:  [97.7 F (36.5 C)-98.2 F (36.8 C)] 97.7 F (36.5 C) (10/20 0420) Pulse Rate:  [85-100] 85  (10/20 0420) Resp:  [20-22] 20  (10/20 0420) BP: (129-161)/(76-96) 129/76 mmHg (10/20 0420) SpO2:  [95 %-98 %] 98 % (10/20 0420) Weight:  [87.4 kg (192 lb 10.9 oz)] 87.4 kg (192 lb 10.9 oz) (10/20 0539) Weight change: 0.037 kg (1.3 oz)  Intake/Output from previous day: 10/19 0701 - 10/20 0700 In: 480 [P.O.:480] Out: -   Intake/Output from this shift:    Physical Exam: General appearance: alert, cooperative, appears stated age and no distress Neck: no adenopathy, no carotid bruit, no JVD, supple, symmetrical, trachea midline and thyroid not enlarged, symmetric, no tenderness/mass/nodules Lungs: clear to auscultation bilaterally Heart: regular rate and rhythm, S1, S2 normal, no murmur, click, rub or gallop Extremities: extremities normal, atraumatic, no cyanosis or edema  Lab Results: Results for orders placed during the hospital encounter of 07/13/12 (from the past 48 hour(s))  MAGNESIUM     Status: Normal   Collection Time   07/14/12 10:32 AM      Component Value Range Comment   Magnesium 1.5  1.5 - 2.5 mg/dL   TSH     Status: Normal   Collection Time   07/14/12 10:32 AM      Component Value Range Comment   TSH 2.022  0.350 - 4.500 uIU/mL   COMPREHENSIVE METABOLIC PANEL     Status: Abnormal   Collection Time   07/14/12 10:32 AM      Component Value Range Comment   Sodium 141  135 - 145 mEq/L    Potassium 3.2 (*) 3.5 - 5.1 mEq/L    Chloride 99  96 - 112 mEq/L    CO2 34 (*) 19 - 32 mEq/L    Glucose, Bld 281 (*) 70 - 99 mg/dL    BUN 14  6 - 23 mg/dL    Creatinine, Ser 4.09  0.50 - 1.35 mg/dL    Calcium 8.8  8.4 - 81.1 mg/dL    Total Protein 6.3  6.0 - 8.3 g/dL    Albumin 3.1 (*) 3.5 - 5.2 g/dL    AST 25  0 - 37 U/L    ALT 24  0 - 53 U/L    Alkaline Phosphatase 74  39 - 117 U/L    Total Bilirubin 0.6  0.3 - 1.2  mg/dL    GFR calc non Af Amer >90  >90 mL/min    GFR calc Af Amer >90  >90 mL/min   CBC     Status: Abnormal   Collection Time   07/14/12 10:32 AM      Component Value Range Comment   WBC 6.4  4.0 - 10.5 K/uL    RBC 4.21 (*) 4.22 - 5.81 MIL/uL    Hemoglobin 12.9 (*) 13.0 - 17.0 g/dL    HCT 91.4 (*) 78.2 - 52.0 %    MCV 87.2  78.0 - 100.0 fL    MCH 30.6  26.0 - 34.0 pg    MCHC 35.1  30.0 - 36.0 g/dL    RDW 95.6  21.3 - 08.6 %    Platelets 240  150 - 400 K/uL   PROTIME-INR     Status: Normal   Collection Time   07/14/12 10:32 AM      Component Value Range Comment   Prothrombin Time 15.2  11.6 - 15.2 seconds  INR 1.22  0.00 - 1.49   TROPONIN I     Status: Abnormal   Collection Time   07/14/12 10:40 AM      Component Value Range Comment   Troponin I 0.71 (*) <0.30 ng/mL   PRO B NATRIURETIC PEPTIDE     Status: Abnormal   Collection Time   07/14/12 10:40 AM      Component Value Range Comment   Pro B Natriuretic peptide (BNP) 3110.0 (*) 0 - 125 pg/mL   GLUCOSE, CAPILLARY     Status: Abnormal   Collection Time   07/14/12 12:03 PM      Component Value Range Comment   Glucose-Capillary 249 (*) 70 - 99 mg/dL    Comment 1 Notify RN     GLUCOSE, CAPILLARY     Status: Abnormal   Collection Time   07/14/12  2:01 PM      Component Value Range Comment   Glucose-Capillary 219 (*) 70 - 99 mg/dL   POCT ACTIVATED CLOTTING TIME     Status: Normal   Collection Time   07/14/12  3:21 PM      Component Value Range Comment   Activated Clotting Time 344     GLUCOSE, CAPILLARY     Status: Abnormal   Collection Time   07/14/12  4:27 PM      Component Value Range Comment   Glucose-Capillary 156 (*) 70 - 99 mg/dL   GLUCOSE, CAPILLARY     Status: Abnormal   Collection Time   07/14/12  9:13 PM      Component Value Range Comment   Glucose-Capillary 199 (*) 70 - 99 mg/dL   CBC     Status: Abnormal   Collection Time   07/15/12  4:25 AM      Component Value Range Comment   WBC 6.4  4.0 -  10.5 K/uL    RBC 4.01 (*) 4.22 - 5.81 MIL/uL    Hemoglobin 12.4 (*) 13.0 - 17.0 g/dL    HCT 16.1 (*) 09.6 - 52.0 %    MCV 88.5  78.0 - 100.0 fL    MCH 30.9  26.0 - 34.0 pg    MCHC 34.9  30.0 - 36.0 g/dL    RDW 04.5  40.9 - 81.1 %    Platelets 207  150 - 400 K/uL   BASIC METABOLIC PANEL     Status: Abnormal   Collection Time   07/15/12  4:25 AM      Component Value Range Comment   Sodium 141  135 - 145 mEq/L    Potassium 3.1 (*) 3.5 - 5.1 mEq/L    Chloride 102  96 - 112 mEq/L    CO2 34 (*) 19 - 32 mEq/L    Glucose, Bld 161 (*) 70 - 99 mg/dL    BUN 16  6 - 23 mg/dL    Creatinine, Ser 9.14  0.50 - 1.35 mg/dL    Calcium 8.2 (*) 8.4 - 10.5 mg/dL    GFR calc non Af Amer >90  >90 mL/min    GFR calc Af Amer >90  >90 mL/min   GLUCOSE, CAPILLARY     Status: Normal   Collection Time   07/15/12  6:46 AM      Component Value Range Comment   Glucose-Capillary 95  70 - 99 mg/dL   GLUCOSE, CAPILLARY     Status: Normal   Collection Time   07/15/12  8:14 AM      Component  Value Range Comment   Glucose-Capillary 95  70 - 99 mg/dL   TROPONIN I     Status: Abnormal   Collection Time   07/15/12  9:05 AM      Component Value Range Comment   Troponin I 1.00 (*) <0.30 ng/mL   GLUCOSE, CAPILLARY     Status: Abnormal   Collection Time   07/15/12 11:28 AM      Component Value Range Comment   Glucose-Capillary 213 (*) 70 - 99 mg/dL   GLUCOSE, CAPILLARY     Status: Abnormal   Collection Time   07/15/12  4:22 PM      Component Value Range Comment   Glucose-Capillary 209 (*) 70 - 99 mg/dL   TROPONIN I     Status: Abnormal   Collection Time   07/15/12  6:25 PM      Component Value Range Comment   Troponin I 0.97 (*) <0.30 ng/mL   GLUCOSE, CAPILLARY     Status: Normal   Collection Time   07/15/12  8:39 PM      Component Value Range Comment   Glucose-Capillary 96  70 - 99 mg/dL   PRO B NATRIURETIC PEPTIDE     Status: Abnormal   Collection Time   07/16/12  2:46 AM      Component Value Range  Comment   Pro B Natriuretic peptide (BNP) 1001.0 (*) 0 - 125 pg/mL   TROPONIN I     Status: Abnormal   Collection Time   07/16/12  2:46 AM      Component Value Range Comment   Troponin I 0.97 (*) <0.30 ng/mL   GLUCOSE, CAPILLARY     Status: Abnormal   Collection Time   07/16/12  3:19 AM      Component Value Range Comment   Glucose-Capillary 38 (*) 70 - 99 mg/dL    Comment 1 Notify RN     GLUCOSE, CAPILLARY     Status: Abnormal   Collection Time   07/16/12  4:09 AM      Component Value Range Comment   Glucose-Capillary 140 (*) 70 - 99 mg/dL   TROPONIN I     Status: Abnormal   Collection Time   07/16/12  5:15 AM      Component Value Range Comment   Troponin I 0.82 (*) <0.30 ng/mL   GLUCOSE, CAPILLARY     Status: Abnormal   Collection Time   07/16/12  7:53 AM      Component Value Range Comment   Glucose-Capillary 124 (*) 70 - 99 mg/dL     Imaging: Imaging results have been reviewed  Assessment/Plan:   1. Principal Problem: 2.  *Pulmonary edema, most likely due to diastolic dysfunction 3. Active Problems: 4.  DIABETES MELLITUS, TYPE II 5.  HYPERLIPIDEMIA 6.  ANXIETY 7.  HYPERTENSION 8.  CHF (congestive heart failure) 9.  Respiratory distress 10.  Hypertensive urgency 11.  CAD (coronary artery disease), subtotalled 1st diag too smal for PCI and mild LCX and LAD disease 10/2009 12.   Time Spent Directly with Patient:  20 minutes  Length of Stay:  LOS: 3 days   S/P 2V PCI/Stent with DES. On asa and Brilenta. Moderate LV dysfunction. On appropriate meds. BNP 1000. On a diuretic. LVEF by 2D 35-40%. OK for D/C home today. ? Why pt isn't on statin? ROV with extender in 1 week and Dr. Salena Saner after that.  Runell Gess 07/16/2012, 8:41 AM

## 2012-07-16 NOTE — Progress Notes (Signed)
Pt/family given discharge instructions, medication lists, follow up appointments, and when to call the doctor.  Pt/family verbalizes understanding. Pt/family given extensive instructions and medication education. Pt aware of when to call the doctor and when to call 911. Thomas Hoff

## 2012-07-17 ENCOUNTER — Emergency Department (HOSPITAL_COMMUNITY): Payer: 59

## 2012-07-17 ENCOUNTER — Encounter (HOSPITAL_COMMUNITY): Payer: Self-pay | Admitting: Adult Health

## 2012-07-17 ENCOUNTER — Inpatient Hospital Stay (HOSPITAL_COMMUNITY)
Admission: EM | Admit: 2012-07-17 | Discharge: 2012-07-19 | DRG: 293 | Disposition: A | Discharge status: Home / Self Care | Payer: 59 | Attending: Cardiovascular Disease | Admitting: Cardiovascular Disease

## 2012-07-17 DIAGNOSIS — J90 Pleural effusion, not elsewhere classified: Secondary | ICD-10-CM

## 2012-07-17 DIAGNOSIS — I5022 Chronic systolic (congestive) heart failure: Secondary | ICD-10-CM

## 2012-07-17 DIAGNOSIS — I2510 Atherosclerotic heart disease of native coronary artery without angina pectoris: Secondary | ICD-10-CM | POA: Diagnosis present

## 2012-07-17 DIAGNOSIS — R0600 Dyspnea, unspecified: Secondary | ICD-10-CM

## 2012-07-17 DIAGNOSIS — Z7982 Long term (current) use of aspirin: Secondary | ICD-10-CM

## 2012-07-17 DIAGNOSIS — I252 Old myocardial infarction: Secondary | ICD-10-CM

## 2012-07-17 DIAGNOSIS — I5021 Acute systolic (congestive) heart failure: Principal | ICD-10-CM | POA: Diagnosis present

## 2012-07-17 DIAGNOSIS — I2589 Other forms of chronic ischemic heart disease: Secondary | ICD-10-CM | POA: Diagnosis present

## 2012-07-17 DIAGNOSIS — K219 Gastro-esophageal reflux disease without esophagitis: Secondary | ICD-10-CM | POA: Diagnosis present

## 2012-07-17 DIAGNOSIS — I509 Heart failure, unspecified: Secondary | ICD-10-CM

## 2012-07-17 DIAGNOSIS — I214 Non-ST elevation (NSTEMI) myocardial infarction: Secondary | ICD-10-CM | POA: Diagnosis present

## 2012-07-17 DIAGNOSIS — R000 Tachycardia, unspecified: Secondary | ICD-10-CM | POA: Diagnosis present

## 2012-07-17 DIAGNOSIS — I255 Ischemic cardiomyopathy: Secondary | ICD-10-CM | POA: Diagnosis present

## 2012-07-17 DIAGNOSIS — F419 Anxiety disorder, unspecified: Secondary | ICD-10-CM | POA: Diagnosis present

## 2012-07-17 DIAGNOSIS — Z79899 Other long term (current) drug therapy: Secondary | ICD-10-CM

## 2012-07-17 DIAGNOSIS — I10 Essential (primary) hypertension: Secondary | ICD-10-CM | POA: Diagnosis present

## 2012-07-17 DIAGNOSIS — E1169 Type 2 diabetes mellitus with other specified complication: Secondary | ICD-10-CM | POA: Diagnosis not present

## 2012-07-17 DIAGNOSIS — F411 Generalized anxiety disorder: Secondary | ICD-10-CM | POA: Diagnosis present

## 2012-07-17 DIAGNOSIS — Z9861 Coronary angioplasty status: Secondary | ICD-10-CM

## 2012-07-17 DIAGNOSIS — E785 Hyperlipidemia, unspecified: Secondary | ICD-10-CM | POA: Diagnosis present

## 2012-07-17 DIAGNOSIS — Z794 Long term (current) use of insulin: Secondary | ICD-10-CM

## 2012-07-17 HISTORY — DX: Chronic systolic (congestive) heart failure: I50.22

## 2012-07-17 HISTORY — DX: Heart failure, unspecified: I50.9

## 2012-07-17 HISTORY — DX: Shortness of breath: R06.02

## 2012-07-17 LAB — POCT I-STAT TROPONIN I

## 2012-07-17 LAB — CBC
HCT: 42.3 % (ref 39.0–52.0)
Hemoglobin: 14.7 g/dL (ref 13.0–17.0)
MCH: 31.5 pg (ref 26.0–34.0)
MCV: 91 fL (ref 78.0–100.0)
MCV: 90.8 fL (ref 78.0–100.0)
Platelets: 293 10*3/uL (ref 150–400)
RBC: 4.66 MIL/uL (ref 4.22–5.81)
RDW: 13.7 % (ref 11.5–15.5)
WBC: 8 10*3/uL (ref 4.0–10.5)
WBC: 8.3 10*3/uL (ref 4.0–10.5)

## 2012-07-17 LAB — TROPONIN I: Troponin I: 0.32 ng/mL (ref <0.30)

## 2012-07-17 LAB — GLUCOSE, CAPILLARY: Glucose-Capillary: 232 mg/dL — ABNORMAL HIGH (ref 70–99)

## 2012-07-17 LAB — CK TOTAL AND CKMB (NOT AT ARMC)
CK, MB: 4.6 ng/mL — ABNORMAL HIGH (ref 0.3–4.0)
Total CK: 176 U/L (ref 7–232)

## 2012-07-17 LAB — BASIC METABOLIC PANEL
Calcium: 9.7 mg/dL (ref 8.4–10.5)
Chloride: 96 mEq/L (ref 96–112)
Creatinine, Ser: 0.73 mg/dL (ref 0.50–1.35)
GFR calc Af Amer: >90 mL/min (ref >90)
Sodium: 137 mEq/L (ref 135–145)

## 2012-07-17 LAB — PRO B NATRIURETIC PEPTIDE: Pro B Natriuretic peptide (BNP): 1396 pg/mL — ABNORMAL HIGH (ref 0–125)

## 2012-07-17 LAB — CREATININE, SERUM: GFR calc Af Amer: >90 mL/min (ref >90)

## 2012-07-17 MED ORDER — HEPARIN SODIUM (PORCINE) 5000 UNIT/ML IJ SOLN
5000.0000 [IU] | Freq: Three times a day (TID) | INTRAMUSCULAR | Status: DC
  Administered 2012-07-17 – 2012-07-19 (×6): 5000 [IU] via SUBCUTANEOUS
  Filled 2012-07-17 (×8): qty 1

## 2012-07-17 MED ORDER — PANTOPRAZOLE SODIUM 40 MG PO TBEC
40.0000 mg | DELAYED_RELEASE_TABLET | Freq: Every day | ORAL | Status: DC
  Administered 2012-07-17 – 2012-07-19 (×3): 40 mg via ORAL
  Filled 2012-07-17 (×3): qty 1

## 2012-07-17 MED ORDER — ZOLPIDEM TARTRATE 5 MG PO TABS: 5.0000 mg | ORAL_TABLET | Freq: Every evening | ORAL | Status: DC | PRN

## 2012-07-17 MED ORDER — FUROSEMIDE 10 MG/ML IJ SOLN: 40.0000 mg | Freq: Two times a day (BID) | INTRAMUSCULAR | Status: DC

## 2012-07-17 MED ORDER — POTASSIUM CHLORIDE CRYS ER 20 MEQ PO TBCR
40.0000 meq | EXTENDED_RELEASE_TABLET | Freq: Every day | ORAL | Status: DC
  Administered 2012-07-18 – 2012-07-19 (×2): 40 meq via ORAL
  Filled 2012-07-17 (×2): qty 2

## 2012-07-17 MED ORDER — LISINOPRIL 10 MG PO TABS: 10.0000 mg | ORAL_TABLET | Freq: Every day | ORAL | Status: DC

## 2012-07-17 MED ORDER — HYDRALAZINE HCL 20 MG/ML IJ SOLN
10.0000 mg | Freq: Once | INTRAMUSCULAR | Status: AC
  Administered 2012-07-17: 10 mg via INTRAVENOUS
  Filled 2012-07-17: qty 0.5

## 2012-07-17 MED ORDER — PANCRELIPASE (LIP-PROT-AMYL) 12000-38000 UNITS PO CPEP
1.0000 | ORAL_CAPSULE | ORAL | Status: DC
  Administered 2012-07-19: 2 via ORAL
  Filled 2012-07-17: qty 2

## 2012-07-17 MED ORDER — ALPRAZOLAM 0.25 MG PO TABS
0.2500 mg | ORAL_TABLET | Freq: Two times a day (BID) | ORAL | Status: DC | PRN
  Administered 2012-07-19: 0.25 mg via ORAL
  Filled 2012-07-17: qty 1

## 2012-07-17 MED ORDER — INSULIN ASPART 100 UNIT/ML ~~LOC~~ SOLN
0.0000 [IU] | Freq: Three times a day (TID) | SUBCUTANEOUS | Status: DC
  Administered 2012-07-18: 2 [IU] via SUBCUTANEOUS
  Administered 2012-07-18: 1 [IU] via SUBCUTANEOUS
  Administered 2012-07-19: 5 [IU] via SUBCUTANEOUS
  Administered 2012-07-19: 2 [IU] via SUBCUTANEOUS

## 2012-07-17 MED ORDER — SODIUM CHLORIDE 0.9 % IJ SOLN
3.0000 mL | Freq: Two times a day (BID) | INTRAMUSCULAR | Status: DC
  Administered 2012-07-17 – 2012-07-19 (×4): 3 mL via INTRAVENOUS

## 2012-07-17 MED ORDER — ASPIRIN 81 MG PO TABS
81.0000 mg | ORAL_TABLET | Freq: Every day | ORAL | Status: DC
  Administered 2012-07-17 – 2012-07-18 (×2): 81 mg via ORAL
  Filled 2012-07-17 (×3): qty 1

## 2012-07-17 MED ORDER — POTASSIUM CHLORIDE CRYS ER 20 MEQ PO TBCR
40.0000 meq | EXTENDED_RELEASE_TABLET | Freq: Every day | ORAL | Status: DC
Start: 2012-07-17 — End: 2012-07-17

## 2012-07-17 MED ORDER — ISOSORBIDE MONONITRATE ER 30 MG PO TB24
30.0000 mg | ORAL_TABLET | Freq: Every day | ORAL | Status: DC
  Administered 2012-07-17 – 2012-07-19 (×3): 30 mg via ORAL
  Filled 2012-07-17 (×3): qty 1

## 2012-07-17 MED ORDER — SODIUM CHLORIDE 0.9 % IV SOLN: 250.0000 mL | INTRAVENOUS | Status: DC | PRN

## 2012-07-17 MED ORDER — ACETAMINOPHEN 325 MG PO TABS: 650.0000 mg | ORAL_TABLET | ORAL | Status: DC | PRN

## 2012-07-17 MED ORDER — FUROSEMIDE 10 MG/ML IJ SOLN
40.0000 mg | Freq: Two times a day (BID) | INTRAMUSCULAR | Status: DC
  Administered 2012-07-17 – 2012-07-19 (×4): 40 mg via INTRAVENOUS
  Filled 2012-07-17 (×4): qty 4
  Filled 2012-07-17: qty 8
  Filled 2012-07-17 (×2): qty 4

## 2012-07-17 MED ORDER — TICAGRELOR 90 MG PO TABS
90.0000 mg | ORAL_TABLET | Freq: Two times a day (BID) | ORAL | Status: DC
  Administered 2012-07-17 – 2012-07-19 (×4): 90 mg via ORAL
  Filled 2012-07-17 (×5): qty 1

## 2012-07-17 MED ORDER — CARVEDILOL 6.25 MG PO TABS
6.2500 mg | ORAL_TABLET | Freq: Two times a day (BID) | ORAL | Status: DC
  Administered 2012-07-18 – 2012-07-19 (×3): 6.25 mg via ORAL
  Filled 2012-07-17 (×5): qty 1

## 2012-07-17 MED ORDER — SODIUM CHLORIDE 0.9 % IJ SOLN: 3.0000 mL | INTRAMUSCULAR | Status: DC | PRN

## 2012-07-17 MED ORDER — INSULIN DETEMIR 100 UNIT/ML ~~LOC~~ SOLN
40.0000 [IU] | Freq: Two times a day (BID) | SUBCUTANEOUS | Status: DC
  Administered 2012-07-17 – 2012-07-19 (×4): 40 [IU] via SUBCUTANEOUS
  Filled 2012-07-17 (×2): qty 10

## 2012-07-17 MED ORDER — ONDANSETRON HCL 4 MG/2ML IJ SOLN: 4.0000 mg | Freq: Four times a day (QID) | INTRAMUSCULAR | Status: DC | PRN

## 2012-07-17 MED ORDER — MULTIVITAMINS PO CAPS: 1.0000 | ORAL_CAPSULE | Freq: Every day | ORAL | Status: DC

## 2012-07-17 MED ORDER — LISINOPRIL 10 MG PO TABS
10.0000 mg | ORAL_TABLET | Freq: Every day | ORAL | Status: DC
  Administered 2012-07-17 – 2012-07-19 (×3): 10 mg via ORAL
  Filled 2012-07-17 (×3): qty 1

## 2012-07-17 MED ORDER — ADULT MULTIVITAMIN W/MINERALS CH
1.0000 | ORAL_TABLET | Freq: Every day | ORAL | Status: DC
  Administered 2012-07-18 – 2012-07-19 (×2): 1 via ORAL
  Filled 2012-07-17 (×2): qty 1

## 2012-07-17 MED ORDER — ASPIRIN EC 81 MG PO TBEC
81.0000 mg | DELAYED_RELEASE_TABLET | Freq: Every day | ORAL | Status: DC
  Administered 2012-07-17 – 2012-07-19 (×3): 81 mg via ORAL
  Filled 2012-07-17 (×3): qty 1

## 2012-07-17 MED FILL — Dextrose Inj 5%: INTRAVENOUS | Qty: 50 | Status: AC

## 2012-07-17 NOTE — ED Provider Notes (Signed)
History     CSN: [id number]  Arrival date & time 07/17/12  1528   First MD Initiated Contact with Patient 07/17/12 1615      Chief Complaint  Patient presents with  . Shortness of Breath    (Consider location/radiation/quality/duration/timing/severity/associated sxs/prior treatment) Patient is a 66 y.o. male presenting with shortness of breath. The history is provided by the patient.  Shortness of Breath  Associated symptoms include shortness of breath. Pertinent negatives include no chest pain, no fever and no cough.  pt states admitted 4 days ago w chest discomfort, sob, sweats, had workup including cardiac cath and 3 stents. Pt denies having heart attack, states was told no heart damage, but notes persistent sob since cath 3 days ago. States even when left hospital yesterday was feeling sob - states he was kept one extra day, but no specific cause sob found. States was started on lasix, never on previously. Denies preceding hx heart disease. Denies any hx asthma, copd or chronic lung disease. Non smoker. Denies preceding chf hx. Is past few days, states breathing worse w lying flat, and has persistent discomfort across upper back/shoulder area. No cp. No diaphoresis. No nv. No leg pain or swelling. No pleuritic pain. No hx dvt or pe. No cough or uri c/o. No fever or chills.   Past Medical History  Diagnosis Date  . Diabetes mellitus     Type II  . Hyperlipemia   . Hypertension   . Pancreatitis   . Anxiety   . Hepatic lesion 02/04/11  . GERD (gastroesophageal reflux disease)   . CAD (coronary artery disease)     last cath by [facility] DR.  Mihai Croitoru showing  some  disease involving LCX and small size of Diag   . NSTEMI (non-ST elevated myocardial infarction) 11/21/2009    Past Surgical History  Procedure Date  . Rectal surgery     cyst  . Cardiac catheterization 2011    minimal disease, medical management  . Coronary angioplasty with stent placement     Family History    Problem Relation Age of Onset  . Diabetes Paternal Grandmother   . Diabetes Maternal Grandfather   . Colon cancer Neg Hx     History  Substance Use Topics  . Smoking status: Never Smoker   . Smokeless tobacco: Never Used  . Alcohol Use: No      Review of Systems  Constitutional: Negative for fever.  HENT: Negative for neck pain.   Eyes: Negative for redness.  Respiratory: Positive for shortness of breath. Negative for cough.   Cardiovascular: Negative for chest pain, palpitations and leg swelling.  Gastrointestinal: Negative for abdominal pain.  Genitourinary: Negative for flank pain.  Musculoskeletal: Negative for back pain.  Skin: Negative for rash.  Neurological: Negative for headaches.  Hematological: Does not bruise/bleed easily.  Psychiatric/Behavioral: Negative for confusion.    Allergies  Review of patient's allergies indicates no known allergies.  Home Medications   Current Outpatient Rx  Name Route Sig Dispense Refill  . ACCU-CHEK AVIVA VI STRP  TEST 2 TIMES A DAY 100 each 5  . ASPIRIN 81 MG PO TABS Oral Take 81 mg by mouth daily.      . ATORVASTATIN CALCIUM 20 MG PO TABS Oral Take 1 tablet (20 mg total) by mouth daily. 30 tablet 5  . BD PEN NEEDLE ULTRAFINE 29G X 12.7MM MISC  USE 4 TIMES A DAY 120 each 4  . ACCU-CHEK ACTIVE CARE KIT KIT Does not  apply by Does not apply route.      Marland Kitchen CARVEDILOL 6.25 MG PO TABS Oral Take 1 tablet (6.25 mg total) by mouth 2 (two) times daily with a meal. 60 tablet 5  . FUROSEMIDE 40 MG PO TABS Oral Take 1 tablet (40 mg total) by mouth daily. 30 tablet 5  . INSULIN DETEMIR 100 UNIT/ML [place] SOLN Subcutaneous Inject 40-60 Units into the skin 2 (two) times daily. 60 units in the morning and 40 units in the evening    . ACCU-CHEK MULTICLIX LANCETS MISC Other 1 each by Other route as directed. Use as instructed     . PANCRELIPASE (LIP-PROT-AMYL) 12000 UNITS PO CPEP Oral Take 1-2 capsules by mouth as directed. 2 capsules with meals and  1 capsule with snacks    . MULTIVITAMINS PO CAPS Oral Take 1 capsule by mouth daily.      Marland Kitchen OMEPRAZOLE 20 MG PO CPDR  Take 1 tablet by mouth once every night. 90 capsule 0  . POTASSIUM CHLORIDE ER 10 MEQ PO TBCR Oral Take 1 tablet (10 mEq total) by mouth daily. 30 tablet 5  . QUINAPRIL-HYDROCHLOROTHIAZIDE 20-12.5 MG PO TABS  2 tabs daily 60 tablet 11  . TICAGRELOR 90 MG PO TABS Oral Take 1 tablet (90 mg total) by mouth 2 (two) times daily. 60 tablet 10    BP 174/138  Pulse 113  Temp 97.8 F (36.6 C) (Oral)  Resp 20  SpO2 94%  Physical Exam  Nursing note and vitals reviewed. Constitutional: He is oriented to person, place, and time. He appears well-developed and well-nourished. No distress.  HENT:  Head: Atraumatic.  Eyes: Conjunctivae normal are normal. Pupils are equal, round, and reactive to light. No scleral icterus.  Neck: Neck supple. No tracheal deviation present.  Cardiovascular: Normal rate, regular rhythm, normal heart sounds and intact distal pulses.  Exam reveals no gallop and no friction rub.   No murmur heard. Pulmonary/Chest: Effort normal and breath sounds normal. No accessory muscle usage. No respiratory distress.  Abdominal: Soft. Bowel sounds are normal. He exhibits no distension. There is no tenderness.  Musculoskeletal: Normal range of motion. He exhibits no edema and no tenderness.  Neurological: He is alert and oriented to person, place, and time.  Skin: Skin is warm and dry.  Psychiatric: He has a normal mood and affect.    ED Course  Procedures (including critical care time)   Labs Reviewed  CBC  BASIC METABOLIC PANEL  PRO B NATRIURETIC PEPTIDE    Results for orders placed during the hospital encounter of 07/17/12  CBC      Component Value Range   WBC 8.0  4.0 - 10.5 K/uL   RBC 4.89  4.22 - 5.81 MIL/uL   Hemoglobin 15.6  13.0 - 17.0 g/dL   HCT 16.1  09.6 - 04.5 %   MCV 91.0  78.0 - 100.0 fL   MCH 31.9  26.0 - 34.0 pg   MCHC 35.1  30.0 - 36.0  g/dL   RDW 40.9  81.1 - 91.4 %   Platelets 293  150 - 400 K/uL  BASIC METABOLIC PANEL      Component Value Range   Sodium 137  135 - 145 mEq/L   Potassium 4.4  3.5 - 5.1 mEq/L   Chloride 96  96 - 112 mEq/L   CO2 33 (*) 19 - 32 mEq/L   Glucose, Bld 356 (*) 70 - 99 mg/dL   BUN 17  6 - 23  mg/dL   Creatinine, Ser 4.13  0.50 - 1.35 mg/dL   Calcium 9.7  8.4 - 24.4 mg/dL   GFR calc non Af Amer >90  >90 mL/min   GFR calc Af Amer >90  >90 mL/min  PRO B NATRIURETIC PEPTIDE      Component Value Range   Pro B Natriuretic peptide (BNP) 1396.0 (*) 0 - 125 pg/mL  POCT I-STAT TROPONIN I      Component Value Range   Troponin i, poc 0.26 (*) 0.00 - 0.08 ng/mL   Comment NOTIFIED PHYSICIAN     Comment 3            Dg Chest 2 View  07/17/2012  *RADIOLOGY REPORT*  Clinical Data: Shortness of breath with cough and back pain since cardiac stent placement 3 days ago.  CHEST - 2 VIEW  Comparison: 07/13/2012 radiographs.  Findings: The heart size and mediastinal contours are stable. Pulmonary edema has improved.  However, left greater than right pleural effusions have enlarged with associated bibasilar pulmonary opacities, likely atelectasis.  There is no pneumothorax.  No acute osseous findings are seen.  IMPRESSION: Enlarged left greater than right pleural effusions with associated bibasilar atelectasis.  Pulmonary edema has improved over the last 4 days.   Original Report Authenticated By: Gerrianne Scale, M.D.    Dg Chest Port 1 View  07/13/2012  *RADIOLOGY REPORT*  Clinical Data: Shortness of breath, tachycardia, and sweating.  PORTABLE CHEST - 1 VIEW  Comparison: 11/21/2009  Findings: Shallow inspiration.  Cardiac enlargement with pulmonary vascular congestion.  Interstitial changes in the lung bases consistent with edema.  Changes are progressing since previous study.  No blunting of costophrenic angles.  No pneumothorax.  IMPRESSION: Cardiac enlargement with increased pulmonary vascular congestion and  developing edema since prior study.   Original Report Authenticated By: Marlon Pel, M.D.       MDM  Iv ns. Labs. 02. Monitor. Ecg.  Reviewed nursing notes and prior charts for additional history.     Date: 07/17/2012  Rate: 103  Rhythm: sinus tachycardia  QRS Axis: normal  Intervals: normal  ST/T Wave abnormalities: normal  Conduction Disutrbances:none  Narrative Interpretation:   Old EKG Reviewed: unchanged   Cath note comments on low ef 30-35%.   Discussed w [facility], they requests we call sehv to admit.   sehv called to admit.       Suzi Roots, MD 07/17/12 1754

## 2012-07-17 NOTE — ED Notes (Signed)
Called report to [phone] , bed is ready.

## 2012-07-17 NOTE — ED Notes (Addendum)
Had three cardiac stents placed thursday  d/c yesterday. Told doctors before he left he was having SOB, worse with lying flat and talking,  Back pain that radiates across from right shoulder blade to left shoulder worse when pressure is applied such as lying flat or pressing on back, denies pain with exertion.  Pt is also concerned because blood sugar has gone down to 40s twice today. No lower extremity edema noted. Pt is speaking in full sentences. Pain is described as dull ache in back.  When in bending forward back pain is better and There is no SOB. Deneis pain with deep inspiration. Bilateral lung sounds clear.

## 2012-07-17 NOTE — ED Notes (Signed)
Report taken from [place], [place]. Pt pain free

## 2012-07-17 NOTE — ED Notes (Signed)
Ordered Diet carb modified.

## 2012-07-17 NOTE — H&P (Signed)
David Lane is an 66 y.o. male.   Chief Complaint: orthopnea  HPI: David Lane is a 66 y.o. male with a past medical history of Diabetes mellitus; Hyperlipemia; Hypertension; Pancreatitis; Anxiety; Hepatic lesion (02/04/11); and GERD (gastroesophageal reflux disease), That was discharged yesterday after presenting with non-ST elevation MI and pulmonary edema as well as uncontrolled hypertension. Additionally his ejection fraction Echocardiogram had dropped from 55% to 35-40%, The cardiac catheterization his EF was 25%.  On 07/14/2012 He underwent PCI With stenting to the mid LAD and PDA off the dominant circumflex with expedition drug-eluting stents by Dr. Allyson Sabal.  Patient was placed on Corag Lasix added to his medical regimen and I believe lisinopril was added, If that was added at discharge the patient was not taking.   Previously had been on Accuretic 20/12-1/2 daily but the medication was not being taken prior to the previous admission because it was lost to the pharmacy.    The day of discharge he was hypoglycemic felt somewhat short of breath and had back pain. When he got home he ate nevertheless his glucose dropped again to 40.  He ADN but during this time frame he also was short of breath he was having to kneel over the sofa to breathe correctly.  Today he also felt short of breath along with back pain between his scapula.  The patient was worried the back pain may be related to his heart in addition the shortness of breath was significant enough that he came to the emergency room.  Here his blood pressure was elevated on admission at 174/138.  Prior BNP 1396 troponin markers 0.260 prior to discharge on the 20th he was 0.82.  We will admit the patient to control his blood pressure evaluated cardiac enzymes and diurese. Please note his D-dimer is elevated at 0.72.  Glucose is 356.    Past Medical History  Diagnosis Date  . Diabetes mellitus     Type II  . Hyperlipemia   .  Hypertension   . Pancreatitis   . Anxiety   . Hepatic lesion 02/04/11  . GERD (gastroesophageal reflux disease)   . CAD (coronary artery disease)     last cath by [facility] DR.  Mihai Croitoru showing  some  disease involving LCX and small size of Diag   . NSTEMI (non-ST elevated myocardial infarction) 11/21/2009  . Shortness of breath     Past Surgical History  Procedure Date  . Rectal surgery     cyst  . Cardiac catheterization 2011    minimal disease, medical management  . Coronary angioplasty with stent placement     Family History  Problem Relation Age of Onset  . Diabetes Paternal Grandmother   . Diabetes Maternal Grandfather   . Colon cancer Neg Hx    Social History:  reports that he has never smoked. He has never used smokeless tobacco. He reports that he does not drink alcohol or use illicit drugs.  Allergies:  Allergies  Allergen Reactions  . Lipitor (Atorvastatin)     OUTPATIENT MEDICATION: Brilinta 90 mg twice a day Coreg 6.25. Mg BID Lasix 40 mg daily ASA 81mg  daily Klorcon 10 meq daily Insulin levemir 40 units at HS and 60 units in am Creon-10/pancrease   Take 2 caps with meals and 1 capsule with snacks prilosec 20 mg daily multivit daily He did not have ace inhibitor bottles with him.  Results for orders placed during the hospital encounter of 07/17/12 (from the past 48  hour(s))  CBC     Status: Normal   Collection Time   07/17/12  3:50 PM      Component Value Range Comment   WBC 8.0  4.0 - 10.5 K/uL    RBC 4.89  4.22 - 5.81 MIL/uL    Hemoglobin 15.6  13.0 - 17.0 g/dL    HCT 40.9  81.1 - 91.4 %    MCV 91.0  78.0 - 100.0 fL    MCH 31.9  26.0 - 34.0 pg    MCHC 35.1  30.0 - 36.0 g/dL    RDW 78.2  95.6 - 21.3 %    Platelets 293  150 - 400 K/uL   BASIC METABOLIC PANEL     Status: Abnormal   Collection Time   07/17/12  3:50 PM      Component Value Range Comment   Sodium 137  135 - 145 mEq/L    Potassium 4.4  3.5 - 5.1 mEq/L    Chloride 96  96 - 112  mEq/L    CO2 33 (*) 19 - 32 mEq/L    Glucose, Bld 356 (*) 70 - 99 mg/dL    BUN 17  6 - 23 mg/dL    Creatinine, Ser 0.86  0.50 - 1.35 mg/dL    Calcium 9.7  8.4 - 57.8 mg/dL    GFR calc non Af Amer >90  >90 mL/min    GFR calc Af Amer >90  >90 mL/min   PRO B NATRIURETIC PEPTIDE     Status: Abnormal   Collection Time   07/17/12  3:50 PM      Component Value Range Comment   Pro B Natriuretic peptide (BNP) 1396.0 (*) 0 - 125 pg/mL   POCT I-STAT TROPONIN I     Status: Abnormal   Collection Time   07/17/12  4:11 PM      Component Value Range Comment   Troponin i, poc 0.26 (*) 0.00 - 0.08 ng/mL    Comment NOTIFIED PHYSICIAN      Comment 3            D-DIMER, QUANTITATIVE     Status: Abnormal   Collection Time   07/17/12  5:27 PM      Component Value Range Comment   D-Dimer, Quant 0.72 (*) 0.00 - 0.48 ug/mL-FEU    Dg Chest 2 View  07/17/2012  *RADIOLOGY REPORT*  Clinical Data: Shortness of breath with cough and back pain since cardiac stent placement 3 days ago.  CHEST - 2 VIEW  Comparison: 07/13/2012 radiographs.  Findings: The heart size and mediastinal contours are stable. Pulmonary edema has improved.  However, left greater than right pleural effusions have enlarged with associated bibasilar pulmonary opacities, likely atelectasis.  There is no pneumothorax.  No acute osseous findings are seen.  IMPRESSION: Enlarged left greater than right pleural effusions with associated bibasilar atelectasis.  Pulmonary edema has improved over the last 4 days.   Original Report Authenticated By: Gerrianne Scale, M.D.     ROS: General:no colds or fevers Skin:no rashes HEENT:no blurred vision CV:+ back pain, no chest pain + sob PUL:+ sob GI:no diarrhea, no constipation GU:no hematuria MS:+ back pain Neuro:no syncope Endo:2 episodes of hypoglycemia yesterday   Blood pressure 174/138, pulse 113, temperature 97.8 F (36.6 C), temperature source Oral, resp. rate 20, SpO2 94.00%. PE:   General:alert and oriented, pleasant affect Skin:warm and dry, brisk capillary refill HEENT:no blurred vision Neck:no JVD, no bruits Heart:S1S2 RRR, loud S3, no  murmur Lungs:no rales Abd:+ BS, soft, non tender  Ext:no edema, Neuro:alert and oriented   Assessment/Plan Principal Problem:  *CHF exacerbation, due to diastolic dysfunction Active Problems:  DIABETES MELLITUS, TYPE II  ANXIETY  HYPERTENSION  PANCREATITIS, HX OF  CAD (coronary artery disease), subtotalled 1st diag too smal for PCI and mild LCX and LAD disease 10/2009. New 07/14/12 NSTEMI with stents placed LAD, PDA   PLAN: IV lasix tonight, add lisinopril,  Add IMDUR, IV hydralizine now to improve BP.  Recheck ckmb, admit to obs, tele bed.  Donterrius Santucci R 07/17/2012, 6:20 PM

## 2012-07-17 NOTE — Progress Notes (Signed)
I have seen and examined the patient along with Nada Boozer, NP.  I have reviewed the chart, notes and new data.  I agree with NP's note.  Recently discharged after a small NSTEMI and newly diagnosed systolic HF (Ef around 35%) and receiving stents to the LAD and PD arteries, this 66 yo diabetic man returns with orthopnea and exertional dyspnea.   BP is very high. Loud S3, tachycardia. No murmurs, rales, JVD or edema. Healthy femoral access site.  No ischemic ECG changes and cTnI on way down from recent acute event. CXR and BNP compatible with CHF, albeit not as severe as on initial presentation Oct 17.  Diuretics. ACE inhibitors.  Moderate initial dose of carvedilol may have played a role in repeat decompensation, but beneficial effect on BP probably outweighs negative inotropic effect.  He did not tolerate lipitor in the past, wants a different statin.  Thurmon Fair, MD, [facility] [phone] 07/17/2012, 6:30 PM

## 2012-07-18 LAB — BASIC METABOLIC PANEL
BUN: 17 mg/dL (ref 6–23)
CO2: 33 mEq/L — ABNORMAL HIGH (ref 19–32)
Calcium: 8.8 mg/dL (ref 8.4–10.5)
Chloride: 97 mEq/L (ref 96–112)
Creatinine, Ser: 0.8 mg/dL (ref 0.50–1.35)
Glucose, Bld: 194 mg/dL — ABNORMAL HIGH (ref 70–99)

## 2012-07-18 LAB — GLUCOSE, CAPILLARY
Glucose-Capillary: 332 mg/dL — ABNORMAL HIGH (ref 70–99)
Glucose-Capillary: 148 mg/dL — ABNORMAL HIGH (ref 70–99)
Glucose-Capillary: 89 mg/dL (ref 70–99)
Glucose-Capillary: 76 mg/dL (ref 70–99)
Glucose-Capillary: 285 mg/dL — ABNORMAL HIGH (ref 70–99)

## 2012-07-18 LAB — PRO B NATRIURETIC PEPTIDE: Pro B Natriuretic peptide (BNP): 1275 pg/mL — ABNORMAL HIGH (ref 0–125)

## 2012-07-18 MED ORDER — SIMVASTATIN 20 MG PO TABS
20.0000 mg | ORAL_TABLET | Freq: Every day | ORAL | Status: DC
  Administered 2012-07-18: 20 mg via ORAL
  Filled 2012-07-18 (×2): qty 1

## 2012-07-18 NOTE — Progress Notes (Signed)
Pt was c/o feeling shaky and "not right" checked CBG was in the 40's, pt given juice and crackers, pt received his dinner late, rechecked sugar went up to the 50's, pt given OJ, 15 min later rechecked and sugar was 76, pt states he feels much better, will continue to monitor

## 2012-07-18 NOTE — Discharge Summary (Signed)
Physician Discharge Summary  Patient ID: OSA BARTNIK MRN: [id number] DOB/AGE: Aug 25, 1946 66 y.o.  Admit date: 07/13/2012 Discharge date: 07/18/2012  Admission Diagnoses:  Pulmonary edema, most likely due to diastolic dysfunction  Discharge Diagnoses:  Principal Problem:  *Pulmonary edema, most likely due to diastolic dysfunction Active Problems:  DIABETES MELLITUS, TYPE II  HYPERLIPIDEMIA  ANXIETY  HYPERTENSION  CHF (congestive heart failure)  Respiratory distress  Hypertensive urgency  CAD (coronary artery disease), subtotalled 1st diag too smal for PCI and mild LCX and LAD disease 10/2009. New 07/14/12 NSTEMI with stents placed LAD, PDA    Discharged Condition: stable  Hospital Course:   KUNJ SAFRIT is a 66 y.o. male with a past medical history of Diabetes mellitus; Hyperlipemia; Hypertension; Pancreatitis; Anxiety; Hepatic lesion (02/04/11); and GERD (gastroesophageal reflux disease). He presented after he ate some shrimp last night and had hard time sleeping he had shortness of breath when he tried to lay down flat. While watching TV he became clammy an very short of breath. He checked his blood pressure and it was 240's and he called for EMS and he presented to ER he was found to have elevated BP up to 193/126. He have and some nausea no vomiting. He has not been taking his medication for blood pressure. He had no chest pain and no pressure. He was very sweaty during this episode. Patient has self reported problems with anxiety.  He have had extensive recent stressors.   Cardiac history does include NSTEMI with pk troponin of 3.11 in Feb. 2011 with cath revealing subtotally occluded 1st diag branch of LAD -culprit vessel with TIMI 3 flow. EF 55%. The diag vessel was not amenable to stenting due to small caliber and diffuse disease. Pt was treated medically. Other vessels with mild diffuse disease. Troponin peaked at 1.00, Pro BNP on admit 1846. Pt is negative 847 ml. BP with  improved control 152/80. EKG with flipped t waves in ant. Leads new from admit. Marland Kitchen  He was taken to the cath lab and had successful PCI and stenting of mid LAD and PDA off the dominant circumflex with expedition drug-eluting stents.  BNP increased to  3110.0.   He was given IV lasix and sent home on PO lasix daily.  BNP prior to DC was 100.0.  He was seen by Dr. Allyson Sabal who felt he was stable for DC home.  Consults: Internal Medicine  Significant Diagnostic Studies:  HEMODYNAMICS:  AO SYSTOLIC/AO DIASTOLIC: 125/73  IMPRESSION:successful PCI and stenting of mid LAD and PDA off the dominant circumflex with expedition drug-eluting stents, Angiomax, and Brilenta . The patient tolerated the procedure well. He left the Cath Lab in stable condition. The sheath will be removed in several hours her pressure will be held on the groin to achieve hemostasis. Patient will be gently hydrated overnight and discharged home in the morning.  Runell Gess MD, [facility]  07/14/2012   PORTABLE CHEST - 1 VIEW  Comparison: 11/21/2009  Findings: Shallow inspiration. Cardiac enlargement with pulmonary  vascular congestion. Interstitial changes in the lung bases  consistent with edema. Changes are progressing since previous  study. No blunting of costophrenic angles. No pneumothorax.  IMPRESSION:  Cardiac enlargement with increased pulmonary vascular congestion  and developing edema since prior study.   BMET    Component Value Date/Time   NA 137 07/18/2012 0703   K 4.0 07/18/2012 0703   CL 97 07/18/2012 0703   CO2 33* 07/18/2012 0703   GLUCOSE 194* 07/18/2012 0703  BUN 17 07/18/2012 0703   CREATININE 0.80 07/18/2012 0703   CALCIUM 8.8 07/18/2012 0703   GFRNONAA >90 07/18/2012 0703   GFRAA >90 07/18/2012 0703    CBC    Component Value Date/Time   WBC 8.3 07/17/2012 1904   RBC 4.66 07/17/2012 1904   HGB 14.7 07/17/2012 1904   HCT 42.3 07/17/2012 1904   PLT 281 07/17/2012 1904   MCV 90.8 07/17/2012  1904   MCH 31.5 07/17/2012 1904   MCHC 34.8 07/17/2012 1904   RDW 13.8 07/17/2012 1904   LYMPHSABS 1.5 01/17/2011 2211   MONOABS 0.9 01/17/2011 2211   EOSABS 0.1 01/17/2011 2211   BASOSABS 0.0 01/17/2011 2211      Discharge Exam: Blood pressure 129/76, pulse 85, temperature 97.7 F (36.5 C), temperature source Oral, resp. rate 20, height 6' (1.829 m), weight 87.4 kg (192 lb 10.9 oz), SpO2 98.00%.   Disposition: 01-Home or Self Care  Discharge Orders    Future Orders Please Complete By Expires   Amb Referral to Cardiac Rehabilitation          Medication List     As of 07/18/2012  2:27 PM    STOP taking these medications         quinapril-hydrochlorothiazide 20-12.5 MG per tablet   Commonly known as: ACCURETIC      TAKE these medications         Accu-Chek Active Care Kit Kit   by Does not apply route.      ACCU-CHEK AVIVA test strip   Generic drug: glucose blood   TEST 2 TIMES A DAY      accu-chek multiclix lancets   1 each by Other route as directed. Use as instructed      aspirin 81 MG tablet   Take 81 mg by mouth daily.      BD ULTRA-FINE PEN NEEDLES 29G X 12.7MM Misc   Generic drug: Insulin Pen Needle   USE 4 TIMES A DAY      carvedilol 6.25 MG tablet   Commonly known as: COREG   Take 1 tablet (6.25 mg total) by mouth 2 (two) times daily with a meal.      furosemide 40 MG tablet   Commonly known as: LASIX   Take 1 tablet (40 mg total) by mouth daily.      insulin detemir 100 UNIT/ML injection   Commonly known as: LEVEMIR   Inject 40-60 Units into the skin 2 (two) times daily. 60 units in the morning and 40 units in the evening      lipase/protease/amylase [address] UNITS Cpep   Commonly known as: CREON-10/PANCREASE   Take 1-2 capsules by mouth as directed. 2 capsules with meals and 1 capsule with snacks      multivitamin capsule   Take 1 capsule by mouth daily.      omeprazole 20 MG capsule   Commonly known as: PRILOSEC   Take 1 tablet by mouth  once every night.      potassium chloride 10 MEQ tablet   Commonly known as: K-DUR   Take 1 tablet (10 mEq total) by mouth daily.      Ticagrelor 90 MG Tabs tablet   Commonly known as: BRILINTA   Take 1 tablet (90 mg total) by mouth 2 (two) times daily.           Follow-up Information    Follow up with Taiz Bickle, PA. (Our office will call you with the appt. date and time.)  Contact information:   [address] [place] [address] [phone]          Signed: Wilburt Finlay 07/18/2012, 2:27 PM

## 2012-07-18 NOTE — Progress Notes (Signed)
[facility]  Subjective: Feeling better.  Still with some PND.  Objective: Vital signs in last 24 hours: Temp:  [97.8 F (36.6 C)-98.4 F (36.9 C)] 98.4 F (36.9 C) (10/22 0452) Pulse Rate:  [92-113] 92  (10/22 0452) Resp:  [14-23] 19  (10/22 0452) BP: (128-174)/(80-138) 128/80 mmHg (10/22 0452) SpO2:  [92 %-95 %] 92 % (10/22 0452) Weight:  [85.4 kg (188 lb 4.4 oz)-86.183 kg (190 lb)] 85.4 kg (188 lb 4.4 oz) (10/22 0236)    Intake/Output from previous day: 10/21 0701 - 10/22 0700 In: -  Out: 1925 [Urine:1925] Intake/Output this shift: Total I/O In: 240 [P.O.:240] Out: 375 [Urine:375]  Medications Current Facility-Administered Medications  Medication Dose Route Frequency Provider Last Rate Last Dose  . 0.9 %  sodium chloride infusion  250 mL Intravenous PRN Nada Boozer, NP      . acetaminophen (TYLENOL) tablet 650 mg  650 mg Oral Q4H PRN Nada Boozer, NP      . ALPRAZolam Prudy Feeler) tablet 0.25 mg  0.25 mg Oral BID PRN Nada Boozer, NP      . aspirin EC tablet 81 mg  81 mg Oral Daily Nada Boozer, NP   81 mg at 07/17/12 2211  . aspirin tablet 81 mg  81 mg Oral Daily Nada Boozer, NP   81 mg at 07/17/12 2354  . carvedilol (COREG) tablet 6.25 mg  6.25 mg Oral BID WC Nada Boozer, NP   6.25 mg at 07/18/12 9147  . furosemide (LASIX) injection 40 mg  40 mg Intravenous BID Nada Boozer, NP   40 mg at 07/18/12 0836  . heparin injection 5,000 Units  5,000 Units Subcutaneous Q8H Nada Boozer, NP   5,000 Units at 07/18/12 8295  . hydrALAZINE (APRESOLINE) injection 10 mg  10 mg Intravenous Once Nada Boozer, NP   10 mg at 07/17/12 2241  . insulin aspart (novoLOG) injection 0-9 Units  0-9 Units Subcutaneous TID WC Nada Boozer, NP   2 Units at 07/18/12 [phone]  . insulin detemir (LEVEMIR) injection 40 Units  40 Units Subcutaneous BID Nada Boozer, NP   40 Units at 07/17/12 2329  . isosorbide mononitrate (IMDUR) 24 hr tablet 30 mg  30 mg Oral Daily Nada Boozer,  NP   30 mg at 07/17/12 2210  . lipase/protease/amylase (CREON-10/PANCREASE) capsule 1-2 capsule  1-2 capsule Oral UD Nada Boozer, NP      . lisinopril (PRINIVIL,ZESTRIL) tablet 10 mg  10 mg Oral Daily Nada Boozer, NP   10 mg at 07/17/12 2210  . multivitamin with minerals tablet 1 tablet  1 tablet Oral Daily Mihai Croitoru, MD      . ondansetron (ZOFRAN) injection 4 mg  4 mg Intravenous Q6H PRN Nada Boozer, NP      . pantoprazole (PROTONIX) EC tablet 40 mg  40 mg Oral Daily Nada Boozer, NP   40 mg at 07/17/12 2207  . potassium chloride SA (K-DUR,KLOR-CON) CR tablet 40 mEq  40 mEq Oral Daily Nada Boozer, NP      . simvastatin (ZOCOR) tablet 20 mg  20 mg Oral q1800 Nada Boozer, NP      . sodium chloride 0.9 % injection 3 mL  3 mL Intravenous Q12H Nada Boozer, NP   3 mL at 07/17/12 2212  . sodium chloride 0.9 % injection 3 mL  3 mL Intravenous PRN Nada Boozer, NP      . Ticagrelor (BRILINTA) tablet 90 mg  90 mg Oral BID Nada Boozer, NP  90 mg at 07/17/12 2209  . zolpidem (AMBIEN) tablet 5 mg  5 mg Oral QHS PRN,MR X 1 Nada Boozer, NP      . DISCONTD: furosemide (LASIX) injection 40 mg  40 mg Intravenous Q12H Nada Boozer, NP      . DISCONTD: lisinopril (PRINIVIL,ZESTRIL) tablet 10 mg  10 mg Oral Daily Nada Boozer, NP      . DISCONTD: multivitamin capsule 1 capsule  1 capsule Oral Daily Nada Boozer, NP      . DISCONTD: potassium chloride SA (K-DUR,KLOR-CON) CR tablet 40 mEq  40 mEq Oral Daily Nada Boozer, NP        PE: General appearance: alert, cooperative and no distress Neck: no JVD Lungs: clear to auscultation bilaterally Heart: regular rate and rhythm Extremities: No LEE Pulses: 2+ and symmetric Skin: wet and warm.  It is warm in the room. Neurologic: Grossly normal  Lab Results:   Basename 07/17/12 1904 07/17/12 1550  WBC 8.3 8.0  HGB 14.7 15.6  HCT 42.3 44.5  PLT 281 293   BMET  Basename 07/18/12 0703 07/17/12 1904 07/17/12 1550  NA 137 -- 137  K 4.0 -- 4.4  CL  97 -- 96  CO2 33* -- 33*  GLUCOSE 194* -- 356*  BUN 17 -- 17  CREATININE 0.80 0.71 0.73  CALCIUM 8.8 -- 9.7    Assessment/Plan   Principal Problem:  *CHF exacerbation, due to diastolic dysfunction Active Problems:  DIABETES MELLITUS, TYPE II  ANXIETY  HYPERTENSION  PANCREATITIS, HX OF  CAD (coronary artery disease), subtotalled 1st diag too smal for PCI and mild LCX and LAD disease 10/2009. New 07/14/12 NSTEMI with stents placed LAD, PDA   Plan:  Troponin trending down from previous admission.   BP improved today.   BNP continues to trend down(1275.0).  Hyperglycemic.  Change to 40bid, PO  Lasix tomorrow.  Titrate coreg to 9.375bid tomorrow as well.  He is anxious about his diet.  He would benefit from OP dietary consult.  IM consult for DM.   LOS: 1 day    David Lane 07/18/2012 9:06 AM

## 2012-07-18 NOTE — Progress Notes (Signed)
Utilization Review Completed.  

## 2012-07-18 NOTE — Plan of Care (Signed)
Problem: Phase I Progression Outcomes Goal: Dyspnea controlled at rest (HF) Outcome: Completed/Met Date Met:  07/18/12 Pt has not had any c/o sob while at rest, goal met Goal: Up in chair, BRP Outcome: Completed/Met Date Met:  07/18/12 Pt oob to chair ad lib, tolerates well, goal met Goal: Voiding-avoid urinary catheter unless indicated Outcome: Completed/Met Date Met:  07/18/12 Pt on bathroom privileges, pt voiding adequate amounts, pt on IV lasix, goal met

## 2012-07-19 DIAGNOSIS — I255 Ischemic cardiomyopathy: Secondary | ICD-10-CM | POA: Diagnosis present

## 2012-07-19 LAB — GLUCOSE, CAPILLARY: Glucose-Capillary: 182 mg/dL — ABNORMAL HIGH (ref 70–99)

## 2012-07-19 LAB — BASIC METABOLIC PANEL
BUN: 21 mg/dL (ref 6–23)
Calcium: 9 mg/dL (ref 8.4–10.5)
Chloride: 101 mEq/L (ref 96–112)
Creatinine, Ser: 0.87 mg/dL (ref 0.50–1.35)
GFR calc Af Amer: >90 mL/min (ref >90)

## 2012-07-19 MED ORDER — ACETAMINOPHEN 325 MG PO TABS: 650.0000 mg | ORAL_TABLET | ORAL | Status: DC | PRN

## 2012-07-19 MED ORDER — LISINOPRIL 10 MG PO TABS: 10.0000 mg | ORAL_TABLET | Freq: Every day | ORAL | Status: DC

## 2012-07-19 MED ORDER — SIMVASTATIN 20 MG PO TABS: 20.0000 mg | ORAL_TABLET | Freq: Every day | ORAL | Status: DC

## 2012-07-19 MED ORDER — NITROGLYCERIN 0.4 MG SL SUBL: 0.4000 mg | SUBLINGUAL_TABLET | SUBLINGUAL | Status: DC | PRN

## 2012-07-19 MED ORDER — ISOSORBIDE MONONITRATE ER 30 MG PO TB24: 30.0000 mg | ORAL_TABLET | Freq: Every day | ORAL | Status: DC

## 2012-07-19 NOTE — Plan of Care (Signed)
Problem: Phase I Progression Outcomes Goal: EF % per last Echo/documented,Core Reminder form on chart Outcome: Completed/Met Date Met:  07/19/12 See previous note EF 35-40%

## 2012-07-19 NOTE — Progress Notes (Signed)
Pt. Seen and examined. Agree with the NP/PA-C note as written.  Much improved. Labs and vitals stable. Possible d/c later today pending [facility] consult.  Chrystie Nose, MD, [facility] Attending Cardiologist [facility]

## 2012-07-19 NOTE — Plan of Care (Signed)
Problem: Food- and Nutrition-Related Knowledge Deficit (NB-1.1) Goal: Nutrition education Formal process to instruct or train a patient/client in a skill or to impart knowledge to help patients/clients voluntarily manage or modify food choices and eating behavior to maintain or improve health.  Outcome: Completed/Met Date Met:  07/19/12 Nutrition Education Note  RD consulted for nutrition education regarding new onset CHF.  RD provided "Low Sodium Nutrition Therapy" handout from the Academy of [employer] and [employer]etics. Reviewed patient's dietary recall. Provided examples on ways to decrease sodium intake in diet. Discouraged intake of processed foods and use of salt shaker. Encouraged fresh fruits and vegetables as well as whole grain sources of carbohydrates to maximize fiber intake.   RD discussed why it is important for patient to adhere to diet recommendations, and emphasized the role of fluids, foods to avoid, and importance of weighing self daily. Pt also with DM. Very knowlageable about BS control and diet. RD helped pt to find a balance of foods that were both low in sugar/carbohydrate and heart healthy.   Expect Good compliance.  Body mass index is 25.16 kg/(m^2). Pt meets criteria for overweight based on current BMI.  Current diet order is Carb Mod, patient is consuming approximately 100% of meals at this time. Labs and medications reviewed. No further nutrition interventions warranted at this time. RD contact information provided. If additional nutrition issues arise, please re-consult RD.   Clarene Duke RD, LDN Pager [phone] After Hours pager [phone]

## 2012-07-19 NOTE — Care Management Note (Signed)
    Page 1 of 1   07/19/2012     3:16:33 PM   CARE MANAGEMENT NOTE 07/19/2012  Patient:  David Lane, David Lane   Account Number:  [id number]  Date Initiated:  07/19/2012  Documentation initiated by:  Tera Mater  Subjective/Objective Assessment:   66yo male admitted with Chest Pain.  Pt. lives at home alone.     Action/Plan:   In to complete HF Screen.  Pt. states he has never had HH services.  Gave pt. list of agencies and he chose [facility].   Anticipated DC Date:  07/19/2012   Anticipated DC Plan:  HOME W HOME HEALTH SERVICES      DC Planning Services  CM consult      [facility] Choice  HOME HEALTH   Choice offered to / List presented to:  C-1 Patient        HH arranged  HH-1 RN  HH-10 DISEASE MANAGEMENT      HH agency  [facility]   Status of service:  Completed, signed off Medicare Important Message given?   (If response is "NO", the following Medicare IM given date fields will be blank) Date Medicare IM given:   Date Additional Medicare IM given:    Discharge Disposition:  HOME W HOME HEALTH SERVICES  Per UR Regulation:  Reviewed for med. necessity/level of care/duration of stay  If discussed at Long Length of Stay Meetings, dates discussed:    Comments:  07/19/12 1500 In to speak with pt. about [facility] services.  Pt. very anxious about going home and his diet. Gave him information on the free nutrition class that is held every other Tuesday from 2pm-3pm in the Heart and Vascular Conference room here at [facility]  The pt. appreciative of education.  TC to Lupita Leash, with [facility] to give referral for [facility] RN for HF management.  Pt. will dc home today.  Tera Mater, RN, BSN NCM [phone]

## 2012-07-19 NOTE — Progress Notes (Signed)
Pt being d/c'd to home, pt given d/c instructions, follow up appointments, and med schedule, pt verbalized understanding, pt requested to walk out, pt stable upon d/c

## 2012-07-19 NOTE — Progress Notes (Signed)
Subjective:  Slept well. Kept yesterday because of low blood sugar. He is very anxious about going home.  Objective:  Vital Signs in the last 24 hours: Temp:  [97.3 F (36.3 C)-98.1 F (36.7 C)] 98.1 F (36.7 C) (10/23 0516) Pulse Rate:  [81-94] 94  (10/23 0816) Resp:  [18] 18  (10/23 0516) BP: (108-124)/(66-80) 124/80 mmHg (10/23 0816) SpO2:  [95 %-98 %] 97 % (10/23 0816) Weight:  [84.142 kg (185 lb 8 oz)] 84.142 kg (185 lb 8 oz) (10/23 0516)  Intake/Output from previous day:  Intake/Output Summary (Last 24 hours) at 07/19/12 1049 Last data filed at 07/19/12 0827  Gross per 24 hour  Intake   1160 ml  Output   2825 ml  Net  -1665 ml    Physical Exam: General appearance: alert, cooperative, no distress and anxious Lungs: clear to auscultation bilaterally Heart: regular rate and rhythm   Rate: 88  Rhythm: normal sinus rhythm  Lab Results:  Basename 07/17/12 1904 07/17/12 1550  WBC 8.3 8.0  HGB 14.7 15.6  PLT 281 293    Basename 07/19/12 0545 07/18/12 0703  NA 143 137  K 3.9 4.0  CL 101 97  CO2 35* 33*  GLUCOSE 170* 194*  BUN 21 17  CREATININE 0.87 0.80    Basename 07/18/12 0703 07/18/12 0050  TROPONINI 0.30* 0.40*   Hepatic Function Panel No results found for this basename: PROT,ALBUMIN,AST,ALT,ALKPHOS,BILITOT,BILIDIR,IBILI in the last 72 hours No results found for this basename: CHOL in the last 72 hours No results found for this basename: INR in the last 72 hours  Imaging: Imaging results have been reviewed  Cardiac Studies:  Assessment/Plan:   Principal Problem:  *Acute systolic CHF (congestive heart failure) Active Problems:  DIABETES MELLITUS, TYPE II, labile  ANXIETY  CAD, 07/14/12- LAD/PDA DES, just discharged 07/16/12  Ischemic cardiomyopathy, EF 35-40% by echo 07/14/12  HYPERLIPIDEMIA  HYPERTENSION   Plan-He says his sugars are controlled at home when he is on his usual diet.  Will ask for  [facility] / CHF consult. Possibly discharge  later today.   Corine Shelter PA-C 07/19/2012, 10:49 AM

## 2012-07-19 NOTE — Discharge Summary (Signed)
Patient ID: David Lane,  MRN: [id number], DOB/AGE: 1946-03-01 66 y.o.  Admit date: 07/17/2012 Discharge date: 07/19/2012  Primary Care Provider:  Primary Cardiologist: Wilburt Finlay PA  Discharge Diagnoses Principal Problem:  *Acute systolic CHF (congestive heart failure) Active Problems:  DIABETES MELLITUS, TYPE II, labile  ANXIETY  CAD, 07/14/12- LAD/PDA DES, just discharged 07/16/12  Ischemic cardiomyopathy, EF 35-40% by echo 07/14/12  [facility]  HYPERTENSION      Hospital Course: Addendum to discharge summary from 07/18/12. David Lane kept overnight because of labile blood sugars. He is extremely anxious about going home. He will be seen by [facility] and Care  manager before discharge.      Discharge Vitals:  Blood pressure 109/68, pulse 92, temperature 98 F (36.7 C), temperature source Oral, resp. rate 18, height 6' (1.829 m), weight 84.142 kg (185 lb 8 oz), SpO2 97.00%.    Labs: Results for orders placed during the hospital encounter of 07/17/12 (from the past 48 hour(s))  CBC     Status: Normal   Collection Time   07/17/12  3:50 PM      Component Value Range Comment   WBC 8.0  4.0 - 10.5 K/uL    RBC 4.89  4.22 - 5.81 MIL/uL    Hemoglobin 15.6  13.0 - 17.0 g/dL    HCT 82.9  56.2 - 13.0 %    MCV 91.0  78.0 - 100.0 fL    MCH 31.9  26.0 - 34.0 pg    MCHC 35.1  30.0 - 36.0 g/dL    RDW 86.5  78.4 - 69.6 %    Platelets 293  150 - 400 K/uL   BASIC METABOLIC PANEL     Status: Abnormal   Collection Time   07/17/12  3:50 PM      Component Value Range Comment   Sodium 137  135 - 145 mEq/L    Potassium 4.4  3.5 - 5.1 mEq/L    Chloride 96  96 - 112 mEq/L    CO2 33 (*) 19 - 32 mEq/L    Glucose, Bld 356 (*) 70 - 99 mg/dL    BUN 17  6 - 23 mg/dL    Creatinine, Ser 2.95  0.50 - 1.35 mg/dL    Calcium 9.7  8.4 - 28.4 mg/dL    GFR calc non Af Amer >90  >90 mL/min    GFR calc Af Amer >90  >90 mL/min   PRO B NATRIURETIC PEPTIDE     Status: Abnormal   Collection  Time   07/17/12  3:50 PM      Component Value Range Comment   Pro B Natriuretic peptide (BNP) 1396.0 (*) 0 - 125 pg/mL   CK TOTAL AND CKMB     Status: Abnormal   Collection Time   07/17/12  3:50 PM      Component Value Range Comment   Total CK 176  7 - 232 U/L    CK, MB 4.6 (*) 0.3 - 4.0 ng/mL    Relative Index 2.6 (*) 0.0 - 2.5   MAGNESIUM     Status: Normal   Collection Time   07/17/12  3:50 PM      Component Value Range Comment   Magnesium 2.2  1.5 - 2.5 mg/dL   POCT I-STAT TROPONIN I     Status: Abnormal   Collection Time   07/17/12  4:11 PM      Component Value Range Comment   Troponin i, poc 0.26 (*)  0.00 - 0.08 ng/mL    Comment NOTIFIED PHYSICIAN      Comment 3            D-DIMER, QUANTITATIVE     Status: Abnormal   Collection Time   07/17/12  5:27 PM      Component Value Range Comment   D-Dimer, Quant 0.72 (*) 0.00 - 0.48 ug/mL-FEU   TROPONIN I     Status: Abnormal   Collection Time   07/17/12  7:03 PM      Component Value Range Comment   Troponin I 0.32 (*) <0.30 ng/mL   GLUCOSE, CAPILLARY     Status: Abnormal   Collection Time   07/17/12  7:04 PM      Component Value Range Comment   Glucose-Capillary 232 (*) 70 - 99 mg/dL   CBC     Status: Normal   Collection Time   07/17/12  7:04 PM      Component Value Range Comment   WBC 8.3  4.0 - 10.5 K/uL    RBC 4.66  4.22 - 5.81 MIL/uL    Hemoglobin 14.7  13.0 - 17.0 g/dL    HCT 32.4  40.1 - 02.7 %    MCV 90.8  78.0 - 100.0 fL    MCH 31.5  26.0 - 34.0 pg    MCHC 34.8  30.0 - 36.0 g/dL    RDW 25.3  66.4 - 40.3 %    Platelets 281  150 - 400 K/uL   CREATININE, SERUM     Status: Normal   Collection Time   07/17/12  7:04 PM      Component Value Range Comment   Creatinine, Ser 0.71  0.50 - 1.35 mg/dL    GFR calc non Af Amer >90  >90 mL/min    GFR calc Af Amer >90  >90 mL/min   GLUCOSE, CAPILLARY     Status: Abnormal   Collection Time   07/17/12 11:24 PM      Component Value Range Comment   Glucose-Capillary 332  (*) 70 - 99 mg/dL   TROPONIN I     Status: Abnormal   Collection Time   07/18/12 12:50 AM      Component Value Range Comment   Troponin I 0.40 (*) <0.30 ng/mL   GLUCOSE, CAPILLARY     Status: Abnormal   Collection Time   07/18/12  5:59 AM      Component Value Range Comment   Glucose-Capillary 198 (*) 70 - 99 mg/dL   BASIC METABOLIC PANEL     Status: Abnormal   Collection Time   07/18/12  7:03 AM      Component Value Range Comment   Sodium 137  135 - 145 mEq/L    Potassium 4.0  3.5 - 5.1 mEq/L    Chloride 97  96 - 112 mEq/L    CO2 33 (*) 19 - 32 mEq/L    Glucose, Bld 194 (*) 70 - 99 mg/dL    BUN 17  6 - 23 mg/dL    Creatinine, Ser 4.74  0.50 - 1.35 mg/dL    Calcium 8.8  8.4 - 25.9 mg/dL    GFR calc non Af Amer >90  >90 mL/min    GFR calc Af Amer >90  >90 mL/min   TROPONIN I     Status: Abnormal   Collection Time   07/18/12  7:03 AM      Component Value Range Comment   Troponin I 0.30 (*) <  0.30 ng/mL   PRO B NATRIURETIC PEPTIDE     Status: Abnormal   Collection Time   07/18/12  7:03 AM      Component Value Range Comment   Pro B Natriuretic peptide (BNP) 1275.0 (*) 0 - 125 pg/mL   GLUCOSE, CAPILLARY     Status: Abnormal   Collection Time   07/18/12 11:19 AM      Component Value Range Comment   Glucose-Capillary 148 (*) 70 - 99 mg/dL    Comment 1 Notify RN     GLUCOSE, CAPILLARY     Status: Normal   Collection Time   07/18/12  4:51 PM      Component Value Range Comment   Glucose-Capillary 89  70 - 99 mg/dL    Comment 1 Notify RN     GLUCOSE, CAPILLARY     Status: Abnormal   Collection Time   07/18/12  5:55 PM      Component Value Range Comment   Glucose-Capillary 46 (*) 70 - 99 mg/dL    Comment 1 Notify RN     GLUCOSE, CAPILLARY     Status: Abnormal   Collection Time   07/18/12  6:14 PM      Component Value Range Comment   Glucose-Capillary 42 (*) 70 - 99 mg/dL   GLUCOSE, CAPILLARY     Status: Abnormal   Collection Time   07/18/12  6:45 PM      Component Value  Range Comment   Glucose-Capillary 56 (*) 70 - 99 mg/dL    Comment 1 Notify RN     GLUCOSE, CAPILLARY     Status: Normal   Collection Time   07/18/12  6:56 PM      Component Value Range Comment   Glucose-Capillary 76  70 - 99 mg/dL   GLUCOSE, CAPILLARY     Status: Abnormal   Collection Time   07/18/12  9:50 PM      Component Value Range Comment   Glucose-Capillary 285 (*) 70 - 99 mg/dL    Comment 1 Notify RN      Comment 2 Documented in Chart     GLUCOSE, CAPILLARY     Status: Abnormal   Collection Time   07/19/12  5:20 AM      Component Value Range Comment   Glucose-Capillary 184 (*) 70 - 99 mg/dL    Comment 1 Notify RN      Comment 2 Documented in Chart     BASIC METABOLIC PANEL     Status: Abnormal   Collection Time   07/19/12  5:45 AM      Component Value Range Comment   Sodium 143  135 - 145 mEq/L    Potassium 3.9  3.5 - 5.1 mEq/L    Chloride 101  96 - 112 mEq/L    CO2 35 (*) 19 - 32 mEq/L    Glucose, Bld 170 (*) 70 - 99 mg/dL    BUN 21  6 - 23 mg/dL    Creatinine, Ser 1.61  0.50 - 1.35 mg/dL    Calcium 9.0  8.4 - 09.6 mg/dL    GFR calc non Af Amer 90 (*) >90 mL/min    GFR calc Af Amer >90  >90 mL/min   GLUCOSE, CAPILLARY     Status: Abnormal   Collection Time   07/19/12  6:06 AM      Component Value Range Comment   Glucose-Capillary 182 (*) 70 - 99 mg/dL   GLUCOSE, CAPILLARY  Status: Abnormal   Collection Time   07/19/12 10:54 AM      Component Value Range Comment   Glucose-Capillary 285 (*) 70 - 99 mg/dL    Comment 1 Notify RN       Disposition:    Discharge Medications:    Medication List     As of 07/19/2012  2:50 PM    STOP taking these medications         quinapril-hydrochlorothiazide 20-12.5 MG per tablet   Commonly known as: ACCURETIC      TAKE these medications         Accu-Chek Active Care Kit Kit   by Does not apply route.      ACCU-CHEK AVIVA test strip   Generic drug: glucose blood   TEST 2 TIMES A DAY      accu-chek  multiclix lancets   1 each by Other route as directed. Use as instructed      acetaminophen 325 MG tablet   Commonly known as: TYLENOL   Take 2 tablets (650 mg total) by mouth every 4 (four) hours as needed.      BD ULTRA-FINE PEN NEEDLES 29G X 12.7MM Misc   Generic drug: Insulin Pen Needle   USE 4 TIMES A DAY      carvedilol 6.25 MG tablet   Commonly known as: COREG   Take 1 tablet (6.25 mg total) by mouth 2 (two) times daily with a meal.      furosemide 40 MG tablet   Commonly known as: LASIX   Take 1 tablet (40 mg total) by mouth daily.      insulin detemir 100 UNIT/ML injection   Commonly known as: LEVEMIR   Inject 40-60 Units into the skin 2 (two) times daily. 60 units in the morning and 40 units in the evening      isosorbide mononitrate 30 MG 24 hr tablet   Commonly known as: IMDUR   Take 1 tablet (30 mg total) by mouth daily.      lipase/protease/amylase 82956 UNITS Cpep   Commonly known as: CREON-10/PANCREASE   Take 1-2 capsules by mouth as directed. 2 capsules with meals and 1 capsule with snacks      lisinopril 10 MG tablet   Commonly known as: PRINIVIL,ZESTRIL   Take 1 tablet (10 mg total) by mouth daily.      multivitamin capsule   Take 1 capsule by mouth daily.      nitroGLYCERIN 0.4 MG SL tablet   Commonly known as: NITROSTAT   Place 1 tablet (0.4 mg total) under the tongue every 5 (five) minutes as needed for chest pain.      omeprazole 20 MG capsule   Commonly known as: PRILOSEC   Take 1 tablet by mouth once every night.      potassium chloride 10 MEQ tablet   Commonly known as: K-DUR   Take 1 tablet (10 mEq total) by mouth daily.      simvastatin 20 MG tablet   Commonly known as: ZOCOR   Take 1 tablet (20 mg total) by mouth daily at 6 PM.      Ticagrelor 90 MG Tabs tablet   Commonly known as: BRILINTA   Take 1 tablet (90 mg total) by mouth 2 (two) times daily.      ASK your doctor about these medications         aspirin 81 MG tablet    Take 81 mg by mouth daily.  Duration of Discharge Encounter: Greater than 30 minutes including physician time.  Jolene Provost PA-C 07/19/2012 2:50 PM

## 2012-07-19 NOTE — Plan of Care (Signed)
Problem: Phase I Progression Outcomes Goal: Pain controlled with appropriate interventions Outcome: Completed/Met Date Met:  07/19/12 Pt has not had any c/o pain, goal met Goal: EF % per last Echo/documented,Core Reminder form on chart pts last EF 35-40%, goal met Goal: Initial discharge plan identified Outcome: Completed/Met Date Met:  07/19/12 pts initial plan for d/c is to go home, goal met, will continue to monitor   Problem: Phase II Progression Outcomes Goal: Pain controlled Outcome: Completed/Met Date Met:  07/19/12 Pt has not had any c/o pain, goal met Goal: Dyspnea controlled with activity Outcome: Completed/Met Date Met:  07/19/12 Pt oob ad lib, pt states he feels better and has not had any c/o sob, pt has 2L PRN has not used it, goal met Goal: Tolerating diet Outcome: Completed/Met Date Met:  07/19/12 Pt on carb modified heart healthy diet, no c/o n/v, pt has good po intake, tolerating diet well, pt met with nutritionist about new HF diet, pt also given hand out on heart failure diets from [facility], pt verbalized understanding, pt anxious about new diet and needs lots of reinforcement, goal met, will continue to monitor

## 2012-08-18 ENCOUNTER — Other Ambulatory Visit: Payer: Self-pay | Admitting: Endocrinology

## 2012-08-31 ENCOUNTER — Other Ambulatory Visit: Payer: Self-pay | Admitting: Endocrinology

## 2012-09-01 ENCOUNTER — Other Ambulatory Visit: Payer: Self-pay | Admitting: *Deleted

## 2012-09-01 MED ORDER — INSULIN DETEMIR 100 UNIT/ML ~~LOC~~ SOLN: 40.0000 [IU] | Freq: Two times a day (BID) | SUBCUTANEOUS | Status: DC

## 2012-09-04 NOTE — Telephone Encounter (Signed)
Ov is due 

## 2012-10-18 ENCOUNTER — Emergency Department (HOSPITAL_COMMUNITY)
Admission: EM | Admit: 2012-10-18 | Discharge: 2012-10-18 | Disposition: A | Discharge status: Home / Self Care | Payer: 59 | Attending: Emergency Medicine | Admitting: Emergency Medicine

## 2012-10-18 ENCOUNTER — Encounter (HOSPITAL_COMMUNITY): Payer: Self-pay | Admitting: *Deleted

## 2012-10-18 DIAGNOSIS — K219 Gastro-esophageal reflux disease without esophagitis: Secondary | ICD-10-CM | POA: Insufficient documentation

## 2012-10-18 DIAGNOSIS — Z79899 Other long term (current) drug therapy: Secondary | ICD-10-CM | POA: Insufficient documentation

## 2012-10-18 DIAGNOSIS — E785 Hyperlipidemia, unspecified: Secondary | ICD-10-CM | POA: Insufficient documentation

## 2012-10-18 DIAGNOSIS — I10 Essential (primary) hypertension: Secondary | ICD-10-CM | POA: Insufficient documentation

## 2012-10-18 DIAGNOSIS — I252 Old myocardial infarction: Secondary | ICD-10-CM | POA: Insufficient documentation

## 2012-10-18 DIAGNOSIS — R42 Dizziness and giddiness: Secondary | ICD-10-CM | POA: Insufficient documentation

## 2012-10-18 DIAGNOSIS — Z7982 Long term (current) use of aspirin: Secondary | ICD-10-CM | POA: Insufficient documentation

## 2012-10-18 DIAGNOSIS — E162 Hypoglycemia, unspecified: Secondary | ICD-10-CM

## 2012-10-18 DIAGNOSIS — T383X5A Adverse effect of insulin and oral hypoglycemic [antidiabetic] drugs, initial encounter: Secondary | ICD-10-CM | POA: Insufficient documentation

## 2012-10-18 DIAGNOSIS — R259 Unspecified abnormal involuntary movements: Secondary | ICD-10-CM | POA: Insufficient documentation

## 2012-10-18 DIAGNOSIS — R5381 Other malaise: Secondary | ICD-10-CM | POA: Insufficient documentation

## 2012-10-18 DIAGNOSIS — F411 Generalized anxiety disorder: Secondary | ICD-10-CM | POA: Insufficient documentation

## 2012-10-18 DIAGNOSIS — I2510 Atherosclerotic heart disease of native coronary artery without angina pectoris: Secondary | ICD-10-CM | POA: Insufficient documentation

## 2012-10-18 DIAGNOSIS — E1169 Type 2 diabetes mellitus with other specified complication: Secondary | ICD-10-CM | POA: Insufficient documentation

## 2012-10-18 DIAGNOSIS — T383X1A Poisoning by insulin and oral hypoglycemic [antidiabetic] drugs, accidental (unintentional), initial encounter: Secondary | ICD-10-CM

## 2012-10-18 DIAGNOSIS — Z8719 Personal history of other diseases of the digestive system: Secondary | ICD-10-CM | POA: Insufficient documentation

## 2012-10-18 DIAGNOSIS — I5030 Unspecified diastolic (congestive) heart failure: Secondary | ICD-10-CM | POA: Insufficient documentation

## 2012-10-18 DIAGNOSIS — Z794 Long term (current) use of insulin: Secondary | ICD-10-CM | POA: Insufficient documentation

## 2012-10-18 DIAGNOSIS — Z9861 Coronary angioplasty status: Secondary | ICD-10-CM | POA: Insufficient documentation

## 2012-10-18 LAB — CBC WITH DIFFERENTIAL/PLATELET
Basophils Absolute: 0 10*3/uL (ref 0.0–0.1)
Basophils Relative: 0 % (ref 0–1)
Eosinophils Absolute: 0.1 10*3/uL (ref 0.0–0.7)
Eosinophils Relative: 1 % (ref 0–5)
HCT: 35.3 % — ABNORMAL LOW (ref 39.0–52.0)
Hemoglobin: 12.6 g/dL — ABNORMAL LOW (ref 13.0–17.0)
MCH: 31.3 pg (ref 26.0–34.0)
MCHC: 35.7 g/dL (ref 30.0–36.0)
Monocytes Absolute: 0.5 10*3/uL (ref 0.1–1.0)
Monocytes Relative: 8 % (ref 3–12)
Neutro Abs: 4.4 10*3/uL (ref 1.7–7.7)
RDW: 13.8 % (ref 11.5–15.5)

## 2012-10-18 LAB — COMPREHENSIVE METABOLIC PANEL
ALT: 34 U/L (ref 0–53)
AST: 39 U/L — ABNORMAL HIGH (ref 0–37)
Alkaline Phosphatase: 76 U/L (ref 39–117)
CO2: 24 mEq/L (ref 19–32)
Chloride: 102 mEq/L (ref 96–112)
GFR calc Af Amer: >90 mL/min (ref >90)
GFR calc non Af Amer: >90 mL/min (ref >90)
Glucose, Bld: 120 mg/dL — ABNORMAL HIGH (ref 70–99)
Potassium: 3.4 mEq/L — ABNORMAL LOW (ref 3.5–5.1)
Sodium: 140 mEq/L (ref 135–145)

## 2012-10-18 LAB — URINALYSIS, ROUTINE W REFLEX MICROSCOPIC
Bilirubin Urine: NEGATIVE
Ketones, ur: NEGATIVE mg/dL
Nitrite: NEGATIVE
Urobilinogen, UA: 0.2 mg/dL (ref 0.0–1.0)
pH: 5 (ref 5.0–8.0)

## 2012-10-18 LAB — BASIC METABOLIC PANEL
BUN: 13 mg/dL (ref 6–23)
Chloride: 103 mEq/L (ref 96–112)
Creatinine, Ser: 0.58 mg/dL (ref 0.50–1.35)
GFR calc Af Amer: >90 mL/min (ref >90)
Glucose, Bld: 122 mg/dL — ABNORMAL HIGH (ref 70–99)
Potassium: 3.3 mEq/L — ABNORMAL LOW (ref 3.5–5.1)

## 2012-10-18 LAB — GLUCOSE, CAPILLARY: Glucose-Capillary: 104 mg/dL — ABNORMAL HIGH (ref 70–99)

## 2012-10-18 LAB — URINE MICROSCOPIC-ADD ON

## 2012-10-18 NOTE — ED Notes (Signed)
Reports being diabetic, did not check cbg this am, went to work and had episode of feeling lightheaded, chills, diaphoresis. Pt drank two sodas and ate candy bar and now cbg 105. Reports symptoms have resolved at this time. No acute distress noted, resp e/u. Skin w/d.

## 2012-10-18 NOTE — ED Provider Notes (Signed)
History     CSN: [id number]  Arrival date & time 10/18/12  1132   First MD Initiated Contact with Patient 10/18/12 1347      Chief Complaint  Patient presents with  . Hypoglycemia    (Consider location/radiation/quality/duration/timing/severity/associated sxs/prior treatment) HPI Comments: Pt with iddm comes in with cc of low sugar. States that he took 1/2 dose of his lantus in the middle of the night as he forgot to take it qhs, and then took the regular dose in the morning. He went on to work, and started getting "shaky" and having some sweats - typical of his low sugars. He ate some food, and drank juice, but had no way of checking his sugar, so he came to the ER. He has been in the ER for 3 hours now, his OT was 104 at arrival. He has no n/v/f/c/diarrhea. No chest pain, sob.  The history is provided by the patient.    Past Medical History  Diagnosis Date  . Diabetes mellitus     Type II  . Hyperlipemia   . Hypertension   . Pancreatitis   . Anxiety   . Hepatic lesion 02/04/11  . GERD (gastroesophageal reflux disease)   . CAD (coronary artery disease)     last cath by [facility] DR.  Mihai Croitoru showing  some  disease involving LCX and small size of Diag   . NSTEMI (non-ST elevated myocardial infarction) 11/21/2009  . Shortness of breath   . CHF exacerbation, due to diastolic dysfunction 07/17/2012    Past Surgical History  Procedure Date  . Rectal surgery     cyst  . Cardiac catheterization 2011    minimal disease, medical management  . Coronary angioplasty with stent placement     Family History  Problem Relation Age of Onset  . Diabetes Paternal Grandmother   . Diabetes Maternal Grandfather   . Colon cancer Neg Hx     History  Substance Use Topics  . Smoking status: Never Smoker   . Smokeless tobacco: Never Used  . Alcohol Use: No      Review of Systems  Constitutional: Positive for diaphoresis. Negative for fever, chills and activity change.    HENT: Negative for neck pain.   Eyes: Negative for visual disturbance.  Respiratory: Negative for cough, chest tightness and shortness of breath.   Cardiovascular: Negative for chest pain.  Gastrointestinal: Negative for abdominal distention.  Genitourinary: Negative for dysuria, enuresis and difficulty urinating.  Musculoskeletal: Negative for arthralgias.  Neurological: Positive for dizziness, tremors and weakness. Negative for light-headedness and headaches.  Psychiatric/Behavioral: Negative for confusion.    Allergies  Lipitor  Home Medications   Current Outpatient Rx  Name  Route  Sig  Dispense  Refill  . ACCU-CHEK AVIVA VI STRP      TEST 2 TIMES A DAY   100 each   5   . ASPIRIN 81 MG PO TABS   Oral   Take 81 mg by mouth daily.           . BD PEN NEEDLE ULTRAFINE 29G X 12.7MM MISC      USE 4 TIMES A DAY   120 each   4   . ACCU-CHEK ACTIVE CARE KIT KIT   Does not apply   by Does not apply route.           Marland Kitchen CARVEDILOL 6.25 MG PO TABS   Oral   Take 1 tablet (6.25 mg total) by mouth 2 (two)  times daily with a meal.   60 tablet   5   . FUROSEMIDE 40 MG PO TABS   Oral   Take 1 tablet (40 mg total) by mouth daily.   30 tablet   5   . INSULIN DETEMIR 100 UNIT/ML [place] SOLN   Subcutaneous   Inject 40-50 Units into the skin 2 (two) times daily. Take 50 units in the morning and 40 units at bedtime         . ISOSORBIDE MONONITRATE ER 30 MG PO TB24   Oral   Take 1 tablet (30 mg total) by mouth daily.   30 tablet   5   . ACCU-CHEK MULTICLIX LANCETS MISC   Other   1 each by Other route as directed. Use as instructed          . PANCRELIPASE (LIP-PROT-AMYL) 12000 UNITS PO CPEP   Oral   Take 1-2 capsules by mouth as directed. 2 capsules with meals and 1 capsule with snacks         . LISINOPRIL 10 MG PO TABS   Oral   Take 1 tablet (10 mg total) by mouth daily.   30 tablet   5   . MULTIVITAMINS PO CAPS   Oral   Take 1 capsule by mouth daily.            Marland Kitchen NITROGLYCERIN 0.4 MG SL SUBL   Sublingual   Place 1 tablet (0.4 mg total) under the tongue every 5 (five) minutes as needed for chest pain.   25 tablet   2   . OMEPRAZOLE 20 MG PO CPDR   Oral   Take 20 mg by mouth daily.         Marland Kitchen POTASSIUM CHLORIDE ER 10 MEQ PO TBCR   Oral   Take 1 tablet (10 mEq total) by mouth daily.   30 tablet   5   . SIMVASTATIN 20 MG PO TABS   Oral   Take 1 tablet (20 mg total) by mouth daily at 6 PM.   30 tablet   5   . TICAGRELOR 90 MG PO TABS   Oral   Take 1 tablet (90 mg total) by mouth 2 (two) times daily.   60 tablet   10     BP 175/93  Pulse 109  Temp 97.5 F (36.4 C) (Oral)  Resp 16  SpO2 99%  Physical Exam  Nursing note and vitals reviewed. Constitutional: He is oriented to person, place, and time. He appears well-developed.  HENT:  Head: Normocephalic and atraumatic.  Eyes: Conjunctivae normal and EOM are normal. Pupils are equal, round, and reactive to light.  Neck: Normal range of motion. Neck supple.  Cardiovascular: Normal rate, regular rhythm and normal heart sounds.   Pulmonary/Chest: Effort normal and breath sounds normal. No respiratory distress. He has no wheezes.  Abdominal: Soft. Bowel sounds are normal. He exhibits no distension. There is no tenderness. There is no rebound and no guarding.  Neurological: He is alert and oriented to person, place, and time. No cranial nerve deficit. Coordination normal.  Skin: Skin is warm.    ED Course  Procedures (including critical care time)  Labs Reviewed  GLUCOSE, CAPILLARY - Abnormal; Notable for the following:    Glucose-Capillary 104 (*)     All other components within normal limits  COMPREHENSIVE METABOLIC PANEL - Abnormal; Notable for the following:    Potassium 3.4 (*)     Glucose, Bld 120 (*)  AST 39 (*)     All other components within normal limits  URINALYSIS, ROUTINE W REFLEX MICROSCOPIC - Abnormal; Notable for the following:    Hgb urine  dipstick TRACE (*)     All other components within normal limits  BASIC METABOLIC PANEL - Abnormal; Notable for the following:    Potassium 3.3 (*)  DUPLICATE REQUEST   Glucose, Bld 122 (*)  DUPLICATE REQUEST   All other components within normal limits  CBC WITH DIFFERENTIAL - Abnormal; Notable for the following:    RBC 4.03 (*)     Hemoglobin 12.6 (*)     HCT 35.3 (*)     All other components within normal limits  GLUCOSE, CAPILLARY - Abnormal; Notable for the following:    Glucose-Capillary 156 (*)     All other components within normal limits  TROPONIN I  URINE MICROSCOPIC-ADD ON   No results found.   No diagnosis found.    MDM   Date: 10/18/2012  Rate: 87  Rhythm: normal sinus rhythm  QRS Axis: normal  Intervals: normal  ST/T Wave abnormalities: normal  Conduction Disutrbances: none  Narrative Interpretation: unremarkable  Pt comes in with cc of low sugar. He is on lantus for his iddm. He apparently started having his typical hypoglycemia like sx late morning, consumed some food and came to the ER. He is feeling better now, We will get basic labs, and watch him for 4 hours in the ER. Troponin and EKG ordered.   5:08 PM Still feeling normal. Will discharge. PO tolerated.    Derwood Kaplan, MD 10/18/12 1708

## 2012-10-27 ENCOUNTER — Other Ambulatory Visit: Payer: Self-pay | Admitting: *Deleted

## 2012-10-27 MED ORDER — INSULIN PEN NEEDLE 29G X 12.7MM MISC: Status: DC

## 2012-10-27 MED ORDER — INSULIN PEN NEEDLE 29G X 12.7MM MISC: 1.0000 | Freq: Four times a day (QID) | Status: DC

## 2012-10-27 NOTE — Telephone Encounter (Signed)
PATIENT REQEUST REFILL ON  BD PEN NEEDLE. LAST OV 08/27/2011. PLEASE ADVISE.

## 2012-10-27 NOTE — Telephone Encounter (Signed)
PATIENT WILL NEED OV FOR FURTHER REFILLS ON MEDS OR SUPPLIESE.

## 2012-10-27 NOTE — Addendum Note (Signed)
Addended by: Elnora Morrison on: 10/27/2012 01:12 PM   Modules accepted: Orders

## 2012-10-27 NOTE — Telephone Encounter (Signed)
Ov is due 

## 2012-11-15 ENCOUNTER — Telehealth: Payer: Self-pay | Admitting: Endocrinology

## 2012-11-15 NOTE — Telephone Encounter (Signed)
i called pt.  Please come in soon for appt

## 2012-11-29 ENCOUNTER — Emergency Department (HOSPITAL_COMMUNITY)
Admission: EM | Admit: 2012-11-29 | Discharge: 2012-11-29 | Disposition: A | Discharge status: Home / Self Care | Payer: 59 | Attending: Emergency Medicine | Admitting: Emergency Medicine

## 2012-11-29 DIAGNOSIS — Z79899 Other long term (current) drug therapy: Secondary | ICD-10-CM | POA: Insufficient documentation

## 2012-11-29 DIAGNOSIS — I252 Old myocardial infarction: Secondary | ICD-10-CM | POA: Insufficient documentation

## 2012-11-29 DIAGNOSIS — Z9861 Coronary angioplasty status: Secondary | ICD-10-CM | POA: Insufficient documentation

## 2012-11-29 DIAGNOSIS — Z7982 Long term (current) use of aspirin: Secondary | ICD-10-CM | POA: Insufficient documentation

## 2012-11-29 DIAGNOSIS — K219 Gastro-esophageal reflux disease without esophagitis: Secondary | ICD-10-CM | POA: Insufficient documentation

## 2012-11-29 DIAGNOSIS — Z8659 Personal history of other mental and behavioral disorders: Secondary | ICD-10-CM | POA: Insufficient documentation

## 2012-11-29 DIAGNOSIS — Z8719 Personal history of other diseases of the digestive system: Secondary | ICD-10-CM | POA: Insufficient documentation

## 2012-11-29 DIAGNOSIS — I509 Heart failure, unspecified: Secondary | ICD-10-CM | POA: Insufficient documentation

## 2012-11-29 DIAGNOSIS — E785 Hyperlipidemia, unspecified: Secondary | ICD-10-CM | POA: Insufficient documentation

## 2012-11-29 DIAGNOSIS — Z8709 Personal history of other diseases of the respiratory system: Secondary | ICD-10-CM | POA: Insufficient documentation

## 2012-11-29 DIAGNOSIS — I2510 Atherosclerotic heart disease of native coronary artery without angina pectoris: Secondary | ICD-10-CM | POA: Insufficient documentation

## 2012-11-29 DIAGNOSIS — E119 Type 2 diabetes mellitus without complications: Secondary | ICD-10-CM | POA: Insufficient documentation

## 2012-11-29 DIAGNOSIS — Z7902 Long term (current) use of antithrombotics/antiplatelets: Secondary | ICD-10-CM | POA: Insufficient documentation

## 2012-11-29 DIAGNOSIS — I10 Essential (primary) hypertension: Secondary | ICD-10-CM | POA: Insufficient documentation

## 2012-11-29 LAB — POCT I-STAT, CHEM 8
BUN: 21 mg/dL (ref 6–23)
Chloride: 102 mEq/L (ref 96–112)
HCT: 38 % — ABNORMAL LOW (ref 39.0–52.0)
Sodium: 143 mEq/L (ref 135–145)

## 2012-11-29 NOTE — ED Notes (Addendum)
Pt states BP very hight today that is the reason.  Pt states his BP has been increase for sometimes. States he thinks his primary care provider not managing his care well. He states need help managing his BP. Pt states 3 stent placed in 2012. Current BP 169/96. Pt denies headache or changes in vision.

## 2012-11-29 NOTE — ED Provider Notes (Signed)
History     CSN: [id number]  Arrival date & time 11/29/12  1459   First MD Initiated Contact with Patient 11/29/12 1817      Chief Complaint  Patient presents with  . Hypertension    (Consider location/radiation/quality/duration/timing/severity/associated sxs/prior treatment) Patient is a 67 y.o. male presenting with hypertension.  Hypertension   Pt with history of HTN and DM reports his BP was elevated today above his baseline. He reports readings around 200/100 when he woke up this morning. BP has gradually improved during the day, he has not had any other symptoms, no CP, SOB, headache, blurry vision, N/V or dysuria. He went to work this morning, but called to make an appointment with his cardiologist. He was scheduled to see them around 3pm, but decided to come to the ED instead.   Past Medical History  Diagnosis Date  . Diabetes mellitus     Type II  . Hyperlipemia   . Hypertension   . Pancreatitis   . Anxiety   . Hepatic lesion 02/04/11  . GERD (gastroesophageal reflux disease)   . CAD (coronary artery disease)     last cath by [facility] DR.  Mihai Croitoru showing  some  disease involving LCX and small size of Diag   . NSTEMI (non-ST elevated myocardial infarction) 11/21/2009  . Shortness of breath   . CHF exacerbation, due to diastolic dysfunction 07/17/2012    Past Surgical History  Procedure Laterality Date  . Rectal surgery      cyst  . Cardiac catheterization  2011    minimal disease, medical management  . Coronary angioplasty with stent placement      Family History  Problem Relation Age of Onset  . Diabetes Paternal Grandmother   . Diabetes Maternal Grandfather   . Colon cancer Neg Hx     History  Substance Use Topics  . Smoking status: Never Smoker   . Smokeless tobacco: Never Used  . Alcohol Use: No      Review of Systems All other systems reviewed and are negative except as noted in HPI.   Allergies  Lipitor  Home Medications   Current  Outpatient Rx  Name  Route  Sig  Dispense  Refill  . aspirin 81 MG tablet   Oral   Take 81 mg by mouth daily.           . carvedilol (COREG) 6.25 MG tablet   Oral   Take 1 tablet (6.25 mg total) by mouth 2 (two) times daily with a meal.   60 tablet   5   . furosemide (LASIX) 40 MG tablet   Oral   Take 1 tablet (40 mg total) by mouth daily.   30 tablet   5   . insulin detemir (LEVEMIR) 100 UNIT/ML injection   Subcutaneous   Inject 50-60 Units into the skin 2 (two) times daily. Take 60 units in the morning and 50 units at bedtime         . isosorbide mononitrate (IMDUR) 30 MG 24 hr tablet   Oral   Take 1 tablet (30 mg total) by mouth daily.   30 tablet   5   . lipase/protease/amylase (CREON-10/PANCREASE) 12000 UNITS CPEP   Oral   Take 1-2 capsules by mouth as directed. 2 capsules with meals and 1 capsule with snacks         . lisinopril (PRINIVIL,ZESTRIL) 10 MG tablet   Oral   Take 1 tablet (10 mg total) by mouth  daily.   30 tablet   5   . Multiple Vitamin (MULTIVITAMIN) capsule   Oral   Take 1 capsule by mouth daily.           . nitroGLYCERIN (NITROSTAT) 0.4 MG SL tablet   Sublingual   Place 1 tablet (0.4 mg total) under the tongue every 5 (five) minutes as needed for chest pain.   25 tablet   2   . omeprazole (PRILOSEC) 20 MG capsule   Oral   Take 20 mg by mouth daily.         . potassium chloride (K-DUR) 10 MEQ tablet   Oral   Take 1 tablet (10 mEq total) by mouth daily.   30 tablet   5   . simvastatin (ZOCOR) 20 MG tablet   Oral   Take 1 tablet (20 mg total) by mouth daily at 6 PM.   30 tablet   5   . Ticagrelor (BRILINTA) 90 MG TABS tablet   Oral   Take 1 tablet (90 mg total) by mouth 2 (two) times daily.   60 tablet   10     BP 169/96  Pulse 102  Temp(Src) 98.9 F (37.2 C) (Oral)  Resp 12  SpO2 100%  Physical Exam  Nursing note and vitals reviewed. Constitutional: He is oriented to person, place, and time. He appears  well-developed and well-nourished.  HENT:  Head: Normocephalic and atraumatic.  Eyes: EOM are normal. Pupils are equal, round, and reactive to light.  Neck: Normal range of motion. Neck supple.  Cardiovascular: Normal rate, normal heart sounds and intact distal pulses.   Pulmonary/Chest: Effort normal and breath sounds normal.  Abdominal: Bowel sounds are normal. He exhibits no distension. There is no tenderness.  Musculoskeletal: Normal range of motion. He exhibits no edema and no tenderness.  Neurological: He is alert and oriented to person, place, and time. He has normal strength. No cranial nerve deficit or sensory deficit.  Skin: Skin is warm and dry. No rash noted.  Psychiatric: He has a normal mood and affect.    ED Course  Procedures (including critical care time)  Labs Reviewed  POCT I-STAT, CHEM 8 - Abnormal; Notable for the following:    Potassium 3.3 (*)    Glucose, Bld 197 (*)    Hemoglobin 12.9 (*)    HCT 38.0 (*)    All other components within normal limits   No results found.   No diagnosis found.    MDM  BP improved, much lower than reported readings at home and asymptomatic. Pt advised that we will check for evidence of end-organ damage but any further adjustments to his medications would have to be made through his PCP or cardiologist.    Date: 11/29/2012  Rate: 90  Rhythm: normal sinus rhythm  QRS Axis: normal  Intervals: normal  ST/T Wave abnormalities: normal  Conduction Disutrbances:none  Narrative Interpretation:   Old EKG Reviewed: none available        Charles B. Bernette Mayers, MD 11/29/12 1949

## 2012-11-29 NOTE — ED Notes (Signed)
Pt in from home via [facility], pt c/o HTN @ home, pt reports Q1H BP checks at home with highest BP reading 202/100 while at home, EMS recorded BP 174/110 in route, pt A&O x4, follows commands, speaks in complete sentences, pt denies vision changes, dizziness, HA, CP, SOB, N/V/D, pt HTN hx

## 2012-12-22 ENCOUNTER — Encounter: Payer: Self-pay | Admitting: Endocrinology

## 2012-12-22 ENCOUNTER — Ambulatory Visit (INDEPENDENT_AMBULATORY_CARE_PROVIDER_SITE_OTHER): Payer: 59 | Admitting: Endocrinology

## 2012-12-22 VITALS — BP 132/80 | HR 100 | Wt 186.0 lb

## 2012-12-22 DIAGNOSIS — E119 Type 2 diabetes mellitus without complications: Secondary | ICD-10-CM

## 2012-12-22 MED ORDER — INSULIN DETEMIR 100 UNIT/ML ~~LOC~~ SOLN: SUBCUTANEOUS | Status: DC

## 2012-12-22 NOTE — Progress Notes (Signed)
Subjective:    Patient ID: David Lane, male    DOB: 06-18-1946, 67 y.o.   MRN: [id number]  HPI Pt returns for f/u of insulin-requiring DM (dx'ed 1988; complicated by CAD; he has never had severe hypoglycemia or DKA).  He is again overdue for f/u. no cbg record, but states cbg's vary widely.  He says cbg's are still lower during the week than on weekends.  He says 50-60 units qam is too much on weekdays, as he still has hypoglycemia if he is active at work.  On weekends, he has hypoglycemia in the afternoon if he takes the full 60-70 units.  It is consistently approx 200 in am.  Past Medical History  Diagnosis Date  . Diabetes mellitus     Type II  . Hyperlipemia   . Hypertension   . Pancreatitis   . Anxiety   . Hepatic lesion 02/04/11  . GERD (gastroesophageal reflux disease)   . CAD (coronary artery disease)     last cath by [facility] DR.  Mihai Croitoru showing  some  disease involving LCX and small size of Diag   . NSTEMI (non-ST elevated myocardial infarction) 11/21/2009  . Shortness of breath   . CHF exacerbation, due to diastolic dysfunction 07/17/2012    Past Surgical History  Procedure Laterality Date  . Rectal surgery      cyst  . Cardiac catheterization  2011    minimal disease, medical management  . Coronary angioplasty with stent placement      History   Social History  . Marital Status: Single    Spouse Name: N/A    Number of Children: 0  . Years of Education: N/A   Occupational History  . [employer]   .  [employer]   Social History Main Topics  . Smoking status: Never Smoker   . Smokeless tobacco: Never Used  . Alcohol Use: No  . Drug Use: No  . Sexually Active: No   Other Topics Concern  . Not on file   Social History Narrative  . No narrative on file    Current Outpatient Prescriptions on File Prior to Visit  Medication Sig Dispense Refill  . aspirin 81 MG tablet Take 81 mg by mouth daily.        . carvedilol (COREG) 6.25 MG tablet  Take 1 tablet (6.25 mg total) by mouth 2 (two) times daily with a meal.  60 tablet  5  . furosemide (LASIX) 40 MG tablet Take 1 tablet (40 mg total) by mouth daily.  30 tablet  5  . insulin detemir (LEVEMIR) 100 UNIT/ML injection Take 50 units in the morning and 45 units at bedtime      . isosorbide mononitrate (IMDUR) 30 MG 24 hr tablet Take 1 tablet (30 mg total) by mouth daily.  30 tablet  5  . lipase/protease/amylase (CREON-10/PANCREASE) 12000 UNITS CPEP Take 1-2 capsules by mouth as directed. 2 capsules with meals and 1 capsule with snacks      . lisinopril (PRINIVIL,ZESTRIL) 10 MG tablet Take 1 tablet (10 mg total) by mouth daily.  30 tablet  5  . Multiple Vitamin (MULTIVITAMIN) capsule Take 1 capsule by mouth daily.        . nitroGLYCERIN (NITROSTAT) 0.4 MG SL tablet Place 1 tablet (0.4 mg total) under the tongue every 5 (five) minutes as needed for chest pain.  25 tablet  2  . omeprazole (PRILOSEC) 20 MG capsule Take 20 mg by mouth daily.      Marland Kitchen  potassium chloride (K-DUR) 10 MEQ tablet Take 1 tablet (10 mEq total) by mouth daily.  30 tablet  5  . simvastatin (ZOCOR) 20 MG tablet Take 1 tablet (20 mg total) by mouth daily at 6 PM.  30 tablet  5  . Ticagrelor (BRILINTA) 90 MG TABS tablet Take 1 tablet (90 mg total) by mouth 2 (two) times daily.  60 tablet  10   No current facility-administered medications on file prior to visit.    Allergies  Allergen Reactions  . Lipitor (Atorvastatin)     Muscle weakness   Family History  Problem Relation Age of Onset  . Diabetes Paternal Grandmother   . Diabetes Maternal Grandfather   . Colon cancer Neg Hx    BP 132/80  Pulse 100  Wt 186 lb (84.369 kg)  BMI 25.22 kg/m2  SpO2 97%  Review of Systems No weight change    Objective:   Physical Exam VITAL SIGNS:  See vs page GENERAL: no distress Pulses: dorsalis pedis intact bilat.   Feet: no deformity.  no ulcer on the feet.  feet are of normal color and temp.  no edema Neuro: sensation  is intact to touch on the feet.     Assessment & Plan:  DM: therapy limited by pt's need for a simple regimen.  The pattern of his cbg's indicates he needs some adjustment in his therapy

## 2012-12-22 NOTE — Patient Instructions (Addendum)
Please take levemir 40 units each morning and 45 units every evening, monday-friday.  on sat and sunday, take 50 units each am, and 45 units each evening.   on this insulin schedule, meals should be eaten on a regular basis.   check your blood sugar 2 times a day.  vary the time of day when you check, between before the 3 meals, and at bedtime.  also check if you have symptoms of your blood sugar being too high or too low.  please keep a record of the readings and bring it to your next appointment here.  please call us sooner if you are having low blood sugar episodes.   Please come back for a follow-up appointment in 1 month.

## 2012-12-26 ENCOUNTER — Other Ambulatory Visit: Payer: Self-pay | Admitting: Internal Medicine

## 2012-12-27 ENCOUNTER — Other Ambulatory Visit: Payer: Self-pay | Admitting: *Deleted

## 2012-12-27 MED ORDER — GLUCOSE BLOOD VI STRP: ORAL_STRIP | Status: DC

## 2013-01-18 ENCOUNTER — Other Ambulatory Visit: Payer: Self-pay | Admitting: Internal Medicine

## 2013-01-21 ENCOUNTER — Other Ambulatory Visit: Payer: Self-pay | Admitting: Internal Medicine

## 2013-01-22 ENCOUNTER — Encounter: Payer: Self-pay | Admitting: *Deleted

## 2013-01-24 ENCOUNTER — Encounter: Payer: Self-pay | Admitting: Endocrinology

## 2013-01-24 ENCOUNTER — Ambulatory Visit (INDEPENDENT_AMBULATORY_CARE_PROVIDER_SITE_OTHER): Payer: 59 | Admitting: Endocrinology

## 2013-01-24 ENCOUNTER — Telehealth: Payer: Self-pay | Admitting: Internal Medicine

## 2013-01-24 VITALS — BP 158/88 | HR 94 | Temp 98.6°F | Resp 12 | Wt 189.0 lb

## 2013-01-24 DIAGNOSIS — E119 Type 2 diabetes mellitus without complications: Secondary | ICD-10-CM

## 2013-01-24 LAB — MICROALBUMIN / CREATININE URINE RATIO
Microalb Creat Ratio: 11.2 mg/g (ref 0.0–30.0)
Microalb, Ur: 8.2 mg/dL — ABNORMAL HIGH (ref 0.0–1.9)

## 2013-01-24 LAB — HEMOGLOBIN A1C: Hgb A1c MFr Bld: 10.9 % — ABNORMAL HIGH (ref 4.6–6.5)

## 2013-01-24 MED ORDER — INSULIN DETEMIR 100 UNIT/ML FLEXPEN: SUBCUTANEOUS | Status: DC

## 2013-01-24 NOTE — Progress Notes (Signed)
Subjective:    Patient ID: David Lane, male    DOB: 1946/08/30, 67 y.o.   MRN: [id number]  HPI Pt returns for f/u of insulin-requiring DM (dx'ed 1988, after an episode of pancreatitis; he has chronic pancreatic insufficiency; complicated by CAD; he has never had severe hypoglycemia or DKA).  he brings a record of his cbg's which i have reviewed today. it is approx 300.  There is no trend throughout the day or week.   Past Medical History  Diagnosis Date  . Diabetes mellitus     Type II  . Hyperlipemia   . Hypertension   . Pancreatitis   . Anxiety   . Hepatic lesion 02/04/11  . GERD (gastroesophageal reflux disease)   . CAD (coronary artery disease)     last cath by [facility] DR.  Mihai Croitoru showing  some  disease involving LCX and small size of Diag   . NSTEMI (non-ST elevated myocardial infarction) 11/21/2009  . Shortness of breath   . CHF exacerbation, due to diastolic dysfunction 07/17/2012  . Ischemic cardiomyopathy     EF 35-40%    Past Surgical History  Procedure Laterality Date  . Rectal surgery      cyst  . Cardiac catheterization  2011    minimal disease, medical management  . Coronary angioplasty with stent placement  07/14/2012    successful PCI & stenting of mid LAD & PDA off the dominant CX    History   Social History  . Marital Status: Single    Spouse Name: N/A    Number of Children: 0  . Years of Education: N/A   Occupational History  . [employer]   .  [employer]   Social History Main Topics  . Smoking status: Never Smoker   . Smokeless tobacco: Never Used  . Alcohol Use: No  . Drug Use: No  . Sexually Active: No   Other Topics Concern  . Not on file   Social History Narrative  . No narrative on file    Current Outpatient Prescriptions on File Prior to Visit  Medication Sig Dispense Refill  . aspirin 81 MG tablet Take 81 mg by mouth daily.        . carvedilol (COREG) 6.25 MG tablet Take 1 tablet (6.25 mg total) by mouth 2 (two)  times daily with a meal.  60 tablet  5  . furosemide (LASIX) 40 MG tablet Take 1 tablet (40 mg total) by mouth daily.  30 tablet  5  . glucose blood test strip Use as instructed  100 each  12  . isosorbide mononitrate (IMDUR) 30 MG 24 hr tablet Take 1 tablet (30 mg total) by mouth daily.  30 tablet  5  . lipase/protease/amylase (CREON-10/PANCREASE) 12000 UNITS CPEP Take 1-2 capsules by mouth as directed. 2 capsules with meals and 1 capsule with snacks      . lisinopril (PRINIVIL,ZESTRIL) 10 MG tablet Take 1 tablet (10 mg total) by mouth daily.  30 tablet  5  . Multiple Vitamin (MULTIVITAMIN) capsule Take 1 capsule by mouth daily.        . nitroGLYCERIN (NITROSTAT) 0.4 MG SL tablet Place 1 tablet (0.4 mg total) under the tongue every 5 (five) minutes as needed for chest pain.  25 tablet  2  . omeprazole (PRILOSEC) 20 MG capsule Take 20 mg by mouth daily.      . potassium chloride (K-DUR) 10 MEQ tablet Take 1 tablet (10 mEq total) by mouth  daily.  30 tablet  5  . simvastatin (ZOCOR) 20 MG tablet Take 1 tablet (20 mg total) by mouth daily at 6 PM.  30 tablet  5  . Ticagrelor (BRILINTA) 90 MG TABS tablet Take 1 tablet (90 mg total) by mouth 2 (two) times daily.  60 tablet  10   No current facility-administered medications on file prior to visit.    Allergies  Allergen Reactions  . Lipitor (Atorvastatin)     Muscle weakness   Family History  Problem Relation Age of Onset  . Diabetes Paternal Grandmother   . Diabetes Maternal Grandfather   . Colon cancer Neg Hx    BP 158/88  Pulse 94  Temp(Src) 98.6 F (37 C) (Oral)  Resp 12  Wt 189 lb (85.73 kg)  BMI 25.63 kg/m2  SpO2 97%  Review of Systems denies hypoglycemia    Objective:   Physical Exam VITAL SIGNS:  See vs page GENERAL: no distress SKIN:  Insulin injection sites at the anterior abdomen are normal.    Lab Results  Component Value Date   HGBA1C 10.9* 01/24/2013      Assessment & Plan:  DM: poor control

## 2013-01-24 NOTE — Patient Instructions (Addendum)
Please increase levemir to 45 units each morning and 50 units every evening, monday-friday.  on sat and sunday, take 55 units each am, and 50 units each evening.   on this insulin schedule, it is not safe to miss or delay meals.   check your blood sugar 2 times a day.  vary the time of day when you check, between before the 3 meals, and at bedtime.  also check if you have symptoms of your blood sugar being too high or too low.  please keep a record of the readings and bring it to your next appointment here.  please call us sooner if you are having low blood sugar episodes.   Please come back for a follow-up appointment in 1 month.

## 2013-01-24 NOTE — Telephone Encounter (Signed)
Patient advised that we are unable to do authorization for creon or give samples as he has not had an office visit in nearly 2 years for his chronic pancreatitis. Patient has scheduled an appointment with Doug Sou, PA-C on 01/25/13 @ 3:30 pm to get exam and labs if needed. We will discuss his medication at that time. Patient verbalizes understanding of the above.

## 2013-01-25 ENCOUNTER — Ambulatory Visit (INDEPENDENT_AMBULATORY_CARE_PROVIDER_SITE_OTHER): Payer: 59 | Admitting: Gastroenterology

## 2013-01-25 ENCOUNTER — Encounter: Payer: Self-pay | Admitting: Cardiovascular Disease

## 2013-01-25 ENCOUNTER — Encounter: Payer: Self-pay | Admitting: Gastroenterology

## 2013-01-25 VITALS — BP 188/100 | HR 78 | Ht 71.5 in | Wt 181.0 lb

## 2013-01-25 DIAGNOSIS — R197 Diarrhea, unspecified: Secondary | ICD-10-CM | POA: Insufficient documentation

## 2013-01-25 DIAGNOSIS — K8689 Other specified diseases of pancreas: Secondary | ICD-10-CM

## 2013-01-25 MED ORDER — PANCRELIPASE (LIP-PROT-AMYL) 36000-114000 UNITS PO CPEP: 2.0000 | ORAL_CAPSULE | Freq: Three times a day (TID) | ORAL | Status: DC

## 2013-01-25 MED ORDER — OMEPRAZOLE 20 MG PO CPDR: 20.0000 mg | DELAYED_RELEASE_CAPSULE | Freq: Every day | ORAL | Status: DC

## 2013-01-25 NOTE — Progress Notes (Signed)
01/25/2013 David Lane [id number] 07/23/1946   History of Present Illness:  Patient is a pleasant 67 year old male who is a patient of Dr. Regino Schultze.  He is status post acute necrotizing pancreatitis with pseudocyst 14 years ago requiring a six-month hospitalization. He developed diabetes and fatty yellow stools as a result of pancreatic insufficiency.  An upper endoscopy and colonoscopy in 2012 showed the question of esophageal varices, retained food in the stomach and gastritis. A colonoscopy showed a poor prep but otherwise normal mucosa. Biopsies showed normal colonic mucosa.  An MRI of the abdomen in 2012 showed dilated main pancreatic duct consistent with chronic pancreatitis. Atrophic head of the pancreas, normal spleen size and gallbladder. 3.5x3.1 cm the right lobe of the liver consistent with hemangioma.  His main complaint at his last visit in 2012 was diarrhea urgency and sometimes incontinence.  He was placed on Creon 12000 units lipase two pills with meals and one with snacks.  He has been taking these for the past two years and called for refills, however, he was given an appointment since he had not been seen.  Now today, he still complains of diarrhea and does not think that the Creon is helping and wants to know if he can stop taking it.  Still has intermittent, unpredictable diarrhea that is interfering with his life and things that he wants to do.   Current Medications, Allergies, Past Medical History, Past Surgical History, Family History and Social History were reviewed in [employer] record.   Physical Exam: BP 188/100  Pulse 78  Ht 5' 11.5" (1.816 m)  Wt 181 lb (82.101 kg)  BMI 24.9 kg/m2 General: Well developed white male in no acute distress, but anxious and pressured speech Head: Normocephalic and atraumatic Eyes:  sclerae anicteric, conjunctiva pink  Ears: Normal auditory acuity Lungs: Clear throughout to auscultation Heart: Regular rate and  rhythm Abdomen: Soft, non-tender and non-distended. No masses, no hepatomegaly. Normal bowel sounds. Musculoskeletal: Symmetrical with no gross deformities  Extremities: No edema  Neurological: Alert oriented x 4, grossly nonfocal Psychological:  Alert and cooperative. Normal mood and affect  Assessment and Recommendations: -Pancreatic insufficiency:  Patient still complaining of intermittent diarrhea.  Will increase his dose of pancreatic enzymes from Creon 12,000 units lipase to 36,000 units lipase and keep him on two pills with meal and one with snacks.  If this dose not help then we can evaluate for other issues and give trial of questran or other medication.

## 2013-01-25 NOTE — Patient Instructions (Addendum)
We have sent the following medications to your pharmacy for you to pick up at your convenience: Creon , Prilosec  Please keep your appointment with Dr. [name] for June 24th at 3:45pm.    Thank you for choosing me and [facility].  Doug Sou, PA-C

## 2013-01-26 NOTE — Progress Notes (Signed)
Reviewed and agree. One way to tell if Creon is helping would be to stop it for 1 week and obtain stool for [place] stain (neutral fat and fatty acids, qualitative). But I will see him in follow up and see how he does on 36.000units. He is not losing weight  So likely digesting OK.

## 2013-01-29 ENCOUNTER — Encounter: Payer: Self-pay | Admitting: *Deleted

## 2013-02-09 ENCOUNTER — Ambulatory Visit (INDEPENDENT_AMBULATORY_CARE_PROVIDER_SITE_OTHER): Payer: 59 | Admitting: Pharmacist Clinician (PhC)/ Clinical Pharmacy Specialist

## 2013-02-09 ENCOUNTER — Ambulatory Visit: Payer: 59 | Admitting: Pharmacist Clinician (PhC)/ Clinical Pharmacy Specialist

## 2013-02-09 ENCOUNTER — Encounter: Payer: Self-pay | Admitting: Pharmacist Clinician (PhC)/ Clinical Pharmacy Specialist

## 2013-02-09 VITALS — BP 190/86 | HR 78

## 2013-02-09 DIAGNOSIS — I10 Essential (primary) hypertension: Secondary | ICD-10-CM

## 2013-02-09 MED ORDER — AMLODIPINE BESYLATE 5 MG PO TABS: 5.0000 mg | ORAL_TABLET | Freq: Every evening | ORAL | Status: DC

## 2013-02-09 MED ORDER — LISINOPRIL 20 MG PO TABS: 20.0000 mg | ORAL_TABLET | Freq: Two times a day (BID) | ORAL | Status: DC

## 2013-02-09 NOTE — Patient Instructions (Addendum)
Continue lisinopril and carvedilol twice daily.  Add amlodipine 5mg  each evening.  Continue checking BP twice daily.  Bring BP readings and any new meds to your next appointment in 4 weeks

## 2013-02-09 NOTE — Progress Notes (Signed)
HPI: Pt is 67 yo male here for a follow up of his blood pressure.  Pt has a history of hypertension, with morning readings often higher than afternoon or evening.  Pt has a measure of anxiety with readings, concerned about having a stroke when readings are 180+/100.  Since last visit pt did not get amlodipine filled, apparently some confusion at pharmacy.  Is currently taking lisinopril 20mg  twice daily rather than 40mg  once daily.  Pt complains of red face when pressure raises, feels "tingling" around head (at hat line). He also has concerns when BP is WNL during day, as is afraid BP may go "too low" and be a danger to him at work; may cause him to be lightheaded or fall.   BMET drawn after last appointment shows Creatinine at 1.0, up from 0.86 previously, still WNL.   Current Outpatient Prescriptions  Medication Sig Dispense Refill  . aspirin 81 MG tablet Take 81 mg by mouth daily.        . carvedilol (COREG) 6.25 MG tablet Take 1 tablet (6.25 mg total) by mouth 2 (two) times daily with a meal.  60 tablet  5  . furosemide (LASIX) 40 MG tablet Take 1 tablet (40 mg total) by mouth daily.  30 tablet  5  . glucose blood test strip Use as instructed  100 each  12  . insulin detemir (LEVEMIR) 100 unit/ml SOLN Take 55 units in the morning and 50 units in the evening, and pen needles 2/day.  105 mL  3  . isosorbide mononitrate (IMDUR) 30 MG 24 hr tablet Take 1 tablet (30 mg total) by mouth daily.  30 tablet  5  . lisinopril (PRINIVIL,ZESTRIL) 10 MG tablet Take 1 tablet (10 mg total) by mouth daily.  30 tablet  5  . Multiple Vitamin (MULTIVITAMIN) capsule Take 1 capsule by mouth daily.        . nitroGLYCERIN (NITROSTAT) 0.4 MG SL tablet Place 1 tablet (0.4 mg total) under the tongue every 5 (five) minutes as needed for chest pain.  25 tablet  2  . omeprazole (PRILOSEC) 20 MG capsule Take 1 capsule (20 mg total) by mouth daily.  90 capsule  3  . Pancrelipase, Lip-Prot-Amyl, (CREON) 36000 UNITS CPEP Take 2  capsules by mouth 3 (three) times daily with meals. Also take 1 capsule with each snack.  720 capsule  0  . potassium chloride (K-DUR) 10 MEQ tablet Take 1 tablet (10 mEq total) by mouth daily.  30 tablet  5  . simvastatin (ZOCOR) 20 MG tablet Take 1 tablet (20 mg total) by mouth daily at 6 PM.  30 tablet  5  . Ticagrelor (BRILINTA) 90 MG TABS tablet Take 1 tablet (90 mg total) by mouth 2 (two) times daily.  60 tablet  10   No current facility-administered medications for this visit.    Allergies  Allergen Reactions  . Lipitor (Atorvastatin)     Muscle weakness    Past Medical History  Diagnosis Date  . Diabetes mellitus     Type II  . Hyperlipemia   . Hypertension   . Pancreatitis   . Anxiety   . Hepatic lesion 02/04/11  . GERD (gastroesophageal reflux disease)   . CAD (coronary artery disease)     last cath by [facility] DR.  Mihai Croitoru showing  some  disease involving LCX and small size of Diag   . NSTEMI (non-ST elevated myocardial infarction) 11/21/2009  . Shortness of breath   .  CHF exacerbation, due to diastolic dysfunction 07/17/2012  . Ischemic cardiomyopathy     EF 35-40%  . Liver hemangioma     BP 190/86  Pulse 78 BP checked x 3 sitting in office: 180/86, 186/88 and 190/100 BP standing 190/86 HR 78  ASSESSMENT AND PLAN: Pt still Stage 2 hypertension, although I believe that anxiety and white coat play a part in this.  Will have him continue lisinopril and carvedilol twice daily and add amlodipine 5mg  with evening meds.  Asked him to call me Monday if has trouble getting meds.  Will follow up in 1 month, asked patient to bring readings with him at that time.  Pt also will need to follow up with Dr. Salena Saner at some point this year, as his last MD visit was in 2011.

## 2013-02-12 ENCOUNTER — Other Ambulatory Visit: Payer: Self-pay | Admitting: Pharmacist Clinician (PhC)/ Clinical Pharmacy Specialist

## 2013-02-12 ENCOUNTER — Emergency Department (HOSPITAL_COMMUNITY)
Admission: EM | Admit: 2013-02-12 | Discharge: 2013-02-12 | Disposition: A | Discharge status: Home / Self Care | Payer: 59 | Attending: Emergency Medicine | Admitting: Emergency Medicine

## 2013-02-12 ENCOUNTER — Encounter (HOSPITAL_COMMUNITY): Payer: Self-pay | Admitting: *Deleted

## 2013-02-12 DIAGNOSIS — F411 Generalized anxiety disorder: Secondary | ICD-10-CM | POA: Insufficient documentation

## 2013-02-12 DIAGNOSIS — K219 Gastro-esophageal reflux disease without esophagitis: Secondary | ICD-10-CM | POA: Insufficient documentation

## 2013-02-12 DIAGNOSIS — Z8719 Personal history of other diseases of the digestive system: Secondary | ICD-10-CM | POA: Insufficient documentation

## 2013-02-12 DIAGNOSIS — E785 Hyperlipidemia, unspecified: Secondary | ICD-10-CM | POA: Insufficient documentation

## 2013-02-12 DIAGNOSIS — Z8679 Personal history of other diseases of the circulatory system: Secondary | ICD-10-CM | POA: Insufficient documentation

## 2013-02-12 DIAGNOSIS — I2510 Atherosclerotic heart disease of native coronary artery without angina pectoris: Secondary | ICD-10-CM | POA: Insufficient documentation

## 2013-02-12 DIAGNOSIS — Z79899 Other long term (current) drug therapy: Secondary | ICD-10-CM | POA: Insufficient documentation

## 2013-02-12 DIAGNOSIS — Z794 Long term (current) use of insulin: Secondary | ICD-10-CM | POA: Insufficient documentation

## 2013-02-12 DIAGNOSIS — Z7982 Long term (current) use of aspirin: Secondary | ICD-10-CM | POA: Insufficient documentation

## 2013-02-12 DIAGNOSIS — I252 Old myocardial infarction: Secondary | ICD-10-CM | POA: Insufficient documentation

## 2013-02-12 DIAGNOSIS — E1169 Type 2 diabetes mellitus with other specified complication: Secondary | ICD-10-CM | POA: Insufficient documentation

## 2013-02-12 DIAGNOSIS — Z9861 Coronary angioplasty status: Secondary | ICD-10-CM | POA: Insufficient documentation

## 2013-02-12 DIAGNOSIS — E162 Hypoglycemia, unspecified: Secondary | ICD-10-CM

## 2013-02-12 DIAGNOSIS — I509 Heart failure, unspecified: Secondary | ICD-10-CM | POA: Insufficient documentation

## 2013-02-12 DIAGNOSIS — I10 Essential (primary) hypertension: Secondary | ICD-10-CM | POA: Insufficient documentation

## 2013-02-12 LAB — GLUCOSE, CAPILLARY: Glucose-Capillary: 152 mg/dL — ABNORMAL HIGH (ref 70–99)

## 2013-02-12 LAB — POCT I-STAT, CHEM 8
BUN: 18 mg/dL (ref 6–23)
Calcium, Ion: 1.17 mmol/L (ref 1.13–1.30)
HCT: 41 % (ref 39.0–52.0)
TCO2: 33 mmol/L (ref 0–100)

## 2013-02-12 MED ORDER — LISINOPRIL 40 MG PO TABS: 40.0000 mg | ORAL_TABLET | Freq: Every day | ORAL | Status: DC

## 2013-02-12 MED ORDER — POTASSIUM CHLORIDE ER 10 MEQ PO TBCR: 10.0000 meq | EXTENDED_RELEASE_TABLET | Freq: Every day | ORAL | Status: DC

## 2013-02-12 MED ORDER — FUROSEMIDE 40 MG PO TABS: 40.0000 mg | ORAL_TABLET | Freq: Every day | ORAL | Status: DC

## 2013-02-12 NOTE — ED Notes (Addendum)
Per EMS: pt from home, pt was sleeping and woke up in a sweat. Pt states that he took his CBG and it was normal but his BP would not register. Pt was 270/120. Pt cold and clammy, pt CBG was 42 pt given oral glucose and blood sugar increased to 56 CBG. Pt having some confusion, but able to follow commands and move extremities.

## 2013-02-12 NOTE — ED Provider Notes (Signed)
History     CSN: [id number]  Arrival date & time 02/12/13  4098   First MD Initiated Contact with Patient 02/12/13 0600      Chief Complaint  Patient presents with  . Hypertension  . Hypoglycemia    (Consider location/radiation/quality/duration/timing/severity/associated sxs/prior treatment) HPI  67 year old male with history of pancreatic insufficiency, diabetes, hypertension, coronary artery disease with prior MI presents with complaints of low blood sugar. Patient states when he has low blood sugar sometime to break out in a sweat. This morning he woke up in a sweat, felt a bit disoriented, and check his blood sugar and it was in the 40s. He also checked his blood pressure on his BP machine and it did not read (this usually happens when it's real high according to him).  Since living by himself he worries and call EMS.  [facility] BP reading was 270/120.  His CBG was 42 per EMS, however he was able to follows all command. pt received oral glucose.  Pt however denies fever, chills, headache, vision changes, slurred speech, cp, sob, n/v/d, rash, calf pain, leg swelling.  Has not been sick lately.  No recent changes in medication.  Has been taking his medications as prescribed.  Report having high BP that's not well controlled with his current medication.  He had an office visit last Friday, was given a new addition BP medication (pt unsure of the name) however has not filled it.  Pt takes insulin and do not take oral diabetic medication.    Past Medical History  Diagnosis Date  . Diabetes mellitus     Type II  . Hyperlipemia   . Hypertension   . Pancreatitis   . Anxiety   . Hepatic lesion 02/04/11  . GERD (gastroesophageal reflux disease)   . CAD (coronary artery disease)     last cath by [facility] DR.  Mihai Croitoru showing  some  disease involving LCX and small size of Diag   . NSTEMI (non-ST elevated myocardial infarction) 11/21/2009  . Shortness of breath   . CHF exacerbation, due to  diastolic dysfunction 07/17/2012  . Ischemic cardiomyopathy     EF 35-40%  . Liver hemangioma     Past Surgical History  Procedure Laterality Date  . Rectal surgery      cyst  . Cardiac catheterization  2011    minimal disease, medical management  . Coronary angioplasty with stent placement  07/14/2012    successful PCI & stenting of mid LAD & PDA off the dominant CX    Family History  Problem Relation Age of Onset  . Diabetes Paternal Grandmother   . Diabetes Maternal Grandfather   . Colon cancer Neg Hx     History  Substance Use Topics  . Smoking status: Never Smoker   . Smokeless tobacco: Never Used  . Alcohol Use: No      Review of Systems  Constitutional:       A complete 10 system review of systems was obtained and all systems are negative except as noted in the HPI and PMH.    Allergies  Lipitor  Home Medications   Current Outpatient Rx  Name  Route  Sig  Dispense  Refill  . aspirin 81 MG tablet   Oral   Take 81 mg by mouth daily.           . furosemide (LASIX) 40 MG tablet   Oral   Take 1 tablet (40 mg total) by mouth daily.  30 tablet   5   . insulin detemir (LEVEMIR) 100 unit/ml SOLN      Take 55 units in the morning and 50 units in the evening, and pen needles 2/day.   105 mL   3   . isosorbide mononitrate (IMDUR) 30 MG 24 hr tablet   Oral   Take 1 tablet (30 mg total) by mouth daily.   30 tablet   5   . lisinopril (PRINIVIL,ZESTRIL) 20 MG tablet   Oral   Take 20 mg by mouth daily.         . Multiple Vitamin (MULTIVITAMIN) capsule   Oral   Take 1 capsule by mouth daily.           Marland Kitchen omeprazole (PRILOSEC) 20 MG capsule   Oral   Take 1 capsule (20 mg total) by mouth daily.   90 capsule   3   . Pancrelipase, Lip-Prot-Amyl, 36000 UNITS CPEP   Oral   Take 1-2 capsules by mouth See admin instructions. 1 capsule with snacks and 2 capsules with meals.         . potassium chloride (K-DUR) 10 MEQ tablet   Oral   Take 1  tablet (10 mEq total) by mouth daily.   30 tablet   5   . simvastatin (ZOCOR) 20 MG tablet   Oral   Take 1 tablet (20 mg total) by mouth daily at 6 PM.   30 tablet   5   . Ticagrelor (BRILINTA) 90 MG TABS tablet   Oral   Take 1 tablet (90 mg total) by mouth 2 (two) times daily.   60 tablet   10   . glucose blood test strip      Use as instructed   100 each   12   . nitroGLYCERIN (NITROSTAT) 0.4 MG SL tablet   Sublingual   Place 1 tablet (0.4 mg total) under the tongue every 5 (five) minutes as needed for chest pain.   25 tablet   2     BP 173/91  Pulse 71  Resp 16  SpO2 100%  Physical Exam  Nursing note and vitals reviewed. Constitutional: He is oriented to person, place, and time. He appears well-developed and well-nourished. No distress.  Awake, alert, nontoxic appearance  HENT:  Head: Atraumatic.  Eyes: Conjunctivae and EOM are normal. Pupils are equal, round, and reactive to light. Right eye exhibits no discharge. Left eye exhibits no discharge.  Neck: Normal range of motion. Neck supple.  Cardiovascular: Normal rate, regular rhythm and intact distal pulses.   Pulmonary/Chest: Effort normal. No respiratory distress. He exhibits no tenderness.  Abdominal: Soft. There is no tenderness. There is no rebound.  Musculoskeletal: He exhibits no tenderness.  ROM appears intact, no obvious focal weakness  Neurological: He is alert and oriented to person, place, and time. He has normal strength. No cranial nerve deficit or sensory deficit. He displays a negative Romberg sign. GCS eye subscore is 4. GCS verbal subscore is 5. GCS motor subscore is 6.  5/5 strength to all 4 extremities  Skin: Skin is warm and dry. No rash noted.  Psychiatric: He has a normal mood and affect.    ED Course  Procedures (including critical care time)  6:23 AM Pt report having a hypoglycemic episode this AM with CBG in the 40s.  Also reports having elevated BP in the 200s.   No headache,  CP, SOB, or neuro deficits.  Is back to baseline after receiving  oral glucose.  BP here is 170s systolic.  Mentating well.  Care discussed with attending.  Since pt is back to baseline, BP improves without treatment, no evidence suggestive of hypertensive emergency at this time, will check CBG, ECG and istat8.  Will continue to monitor.  Pt has been seen in his doctors office last week and has been seen in ER 3 times in the past month for elevated BP related complaints.    Currently i have low suspicion for stroke, TIA, MI, or other emergent condition.    7:57 AM Patient resting comfortably in bed. Able to communicate without any difficulty. Blood pressure normalized to 129/66. His most current CBG is 152. At this time I felt that patient stable for discharge. Patient agrees to followup with his primary care Dr. for further management. Return precautions discussed. All questions answered to patient's satisfaction.    Labs Reviewed  GLUCOSE, CAPILLARY - Abnormal; Notable for the following:    Glucose-Capillary 152 (*)    All other components within normal limits  POCT I-STAT, CHEM 8 - Abnormal; Notable for the following:    Glucose, Bld 131 (*)    All other components within normal limits   No results found.   1. Hypoglycemia       MDM  BP 150/74  Pulse 76  Temp(Src) 97.5 F (36.4 C) (Oral)  Resp 11  Ht 6' (1.829 m)  Wt 181 lb (82.101 kg)  BMI 24.54 kg/m2  SpO2 92%         Fayrene Helper, PA-C 02/12/13 0800

## 2013-02-12 NOTE — ED Notes (Signed)
Checked CBG 152

## 2013-02-12 NOTE — ED Notes (Signed)
Patient finishing [place] sandwich and making phone calls for transportation.

## 2013-02-14 ENCOUNTER — Ambulatory Visit (INDEPENDENT_AMBULATORY_CARE_PROVIDER_SITE_OTHER): Payer: 59 | Admitting: Endocrinology

## 2013-02-14 ENCOUNTER — Encounter: Payer: Self-pay | Admitting: Endocrinology

## 2013-02-14 VITALS — BP 144/94 | HR 109 | Temp 98.7°F | Resp 12 | Ht 72.0 in | Wt 189.0 lb

## 2013-02-14 DIAGNOSIS — E119 Type 2 diabetes mellitus without complications: Secondary | ICD-10-CM

## 2013-02-14 NOTE — ED Provider Notes (Signed)
History/physical exam/procedure(s) were performed by non-physician practitioner and as supervising physician I was immediately available for consultation/collaboration. I have reviewed all notes and am in agreement with care and plan.   Hilario Quarry, MD 02/14/13 1416

## 2013-02-14 NOTE — Progress Notes (Signed)
Subjective:    Patient ID: David Lane, male    DOB: 11/14/1945, 67 y.o.   MRN: [id number]  HPI Pt returns for f/u of insulin-requiring DM (dx'ed 1988, in the context of an episode of pancreatitis; he has chronic pancreatic insufficiency; he has mild if any neuropathy of the lower extremities; he has associated CAD; he has never had severe hypoglycemia or DKA).  Pt is having severe hypoglycemia in the early hrs of the morning.  no cbg record, but states cbg's then increase to the 400's a few hrs later.  He says on some of these occasions, he has taken extra insulin during the day.   Past Medical History  Diagnosis Date  . Diabetes mellitus     Type II  . Hyperlipemia   . Hypertension   . Pancreatitis   . Anxiety   . Hepatic lesion 02/04/11  . GERD (gastroesophageal reflux disease)   . CAD (coronary artery disease)     last cath by [facility] DR.  Mihai Croitoru showing  some  disease involving LCX and small size of Diag   . NSTEMI (non-ST elevated myocardial infarction) 11/21/2009  . Shortness of breath   . CHF exacerbation, due to diastolic dysfunction 07/17/2012  . Ischemic cardiomyopathy     EF 35-40%  . Liver hemangioma     Past Surgical History  Procedure Laterality Date  . Rectal surgery      cyst  . Cardiac catheterization  2011    minimal disease, medical management  . Coronary angioplasty with stent placement  07/14/2012    successful PCI & stenting of mid LAD & PDA off the dominant CX    History   Social History  . Marital Status: Single    Spouse Name: N/A    Number of Children: 0  . Years of Education: N/A   Occupational History  . [employer]   .  [employer]   Social History Main Topics  . Smoking status: Never Smoker   . Smokeless tobacco: Never Used  . Alcohol Use: No  . Drug Use: No  . Sexually Active: No   Other Topics Concern  . Not on file   Social History Narrative  . No narrative on file    Current Outpatient Prescriptions on File  Prior to Visit  Medication Sig Dispense Refill  . aspirin 81 MG tablet Take 81 mg by mouth daily.        . furosemide (LASIX) 40 MG tablet Take 1 tablet (40 mg total) by mouth daily.  90 tablet  1  . glucose blood test strip Use as instructed  100 each  12  . isosorbide mononitrate (IMDUR) 30 MG 24 hr tablet Take 1 tablet (30 mg total) by mouth daily.  30 tablet  5  . lisinopril (PRINIVIL,ZESTRIL) 40 MG tablet Take 1 tablet (40 mg total) by mouth daily.  90 tablet  1  . Multiple Vitamin (MULTIVITAMIN) capsule Take 1 capsule by mouth daily.        . nitroGLYCERIN (NITROSTAT) 0.4 MG SL tablet Place 1 tablet (0.4 mg total) under the tongue every 5 (five) minutes as needed for chest pain.  25 tablet  2  . omeprazole (PRILOSEC) 20 MG capsule Take 1 capsule (20 mg total) by mouth daily.  90 capsule  3  . Pancrelipase, Lip-Prot-Amyl, 36000 UNITS CPEP Take 1-2 capsules by mouth See admin instructions. 1 capsule with snacks and 2 capsules with meals.      Marland Kitchen  potassium chloride (K-DUR) 10 MEQ tablet Take 1 tablet (10 mEq total) by mouth daily.  90 tablet  1  . simvastatin (ZOCOR) 20 MG tablet Take 1 tablet (20 mg total) by mouth daily at 6 PM.  30 tablet  5  . Ticagrelor (BRILINTA) 90 MG TABS tablet Take 1 tablet (90 mg total) by mouth 2 (two) times daily.  60 tablet  10   No current facility-administered medications on file prior to visit.    Allergies  Allergen Reactions  . Lipitor (Atorvastatin)     Muscle weakness   Family History  Problem Relation Age of Onset  . Diabetes Paternal Grandmother   . Diabetes Maternal Grandfather   . Colon cancer Neg Hx    BP 144/94  Pulse 109  Temp(Src) 98.7 F (37.1 C) (Oral)  Resp 12  Ht 6' (1.829 m)  Wt 189 lb (85.73 kg)  BMI 25.63 kg/m2  SpO2 96%  Review of Systems Denies LOC and weight change    Objective:   Physical Exam VITAL SIGNS:  See vs page. GENERAL: no distress.     Assessment & Plan:  DM: therapy is severely limited by  noncompliance with cbg's, variable daily schedule, and by his need for a simple insulin regimen.  i'll do the best i can.  He is not a candidate for multiple daily injections.

## 2013-02-14 NOTE — Patient Instructions (Addendum)
Please reduce levemir to 45 units each morning and 40 units every evening, monday-friday.  on sat and sunday, take 55 units each am, and 40 units each evening.   on this insulin schedule, it is not safe to miss or delay meals.  It is also not safe to take extra insulin.  Please take just these amounts.   check your blood sugar 2 times a day.  vary the time of day when you check, between before the 3 meals, and at bedtime.  also check if you have symptoms of your blood sugar being too high or too low.  please keep a record of the readings and bring it to your next appointment here.  please call us sooner if you are having low blood sugar episodes.   Please come back for a follow-up appointment in 2 weeks.

## 2013-02-22 ENCOUNTER — Telehealth: Payer: Self-pay | Admitting: *Deleted

## 2013-02-22 NOTE — Telephone Encounter (Signed)
LMOM with stable lab results.

## 2013-02-23 ENCOUNTER — Encounter: Payer: Self-pay | Admitting: Endocrinology

## 2013-02-23 ENCOUNTER — Ambulatory Visit (INDEPENDENT_AMBULATORY_CARE_PROVIDER_SITE_OTHER): Payer: 59 | Admitting: Endocrinology

## 2013-02-23 VITALS — BP 126/74 | HR 80 | Ht 72.0 in | Wt 197.0 lb

## 2013-02-23 DIAGNOSIS — E119 Type 2 diabetes mellitus without complications: Secondary | ICD-10-CM

## 2013-02-23 NOTE — Progress Notes (Signed)
Subjective:    Patient ID: David Lane, male    DOB: 05-02-1946, 67 y.o.   MRN: [id number]  HPI Pt returns for f/u of insulin-requiring DM (dx'ed 1988, in the context of an episode of pancreatitis; he has chronic pancreatic insufficiency; he has mild if any neuropathy of the lower extremities; he has associated CAD; his last episode of severe hypoglycemia was in early may, 2014; he has never had DKA).  Since last ov, he has had no further hypoglycemia.    We discussed the episodes of severe hypoglycemia he had a few weeks ago.  i asked him if he feels these episodes were due to taking more than the prescribed insulin dosage.  He says this is very possible. Past Medical History  Diagnosis Date  . Diabetes mellitus     Type II  . Hyperlipemia   . Hypertension   . Pancreatitis   . Anxiety   . Hepatic lesion 02/04/11  . GERD (gastroesophageal reflux disease)   . CAD (coronary artery disease)     last cath by [facility] DR.  Mihai Croitoru showing  some  disease involving LCX and small size of Diag   . NSTEMI (non-ST elevated myocardial infarction) 11/21/2009  . Shortness of breath   . CHF exacerbation, due to diastolic dysfunction 07/17/2012  . Ischemic cardiomyopathy     EF 35-40%  . Liver hemangioma     Past Surgical History  Procedure Laterality Date  . Rectal surgery      cyst  . Cardiac catheterization  2011    minimal disease, medical management  . Coronary angioplasty with stent placement  07/14/2012    successful PCI & stenting of mid LAD & PDA off the dominant CX    History   Social History  . Marital Status: Single    Spouse Name: N/A    Number of Children: 0  . Years of Education: N/A   Occupational History  . [employer]   .  [employer]   Social History Main Topics  . Smoking status: Never Smoker   . Smokeless tobacco: Never Used  . Alcohol Use: No  . Drug Use: No  . Sexually Active: No   Other Topics Concern  . Not on file   Social History  Narrative  . No narrative on file    Current Outpatient Prescriptions on File Prior to Visit  Medication Sig Dispense Refill  . aspirin 81 MG tablet Take 81 mg by mouth daily.        . furosemide (LASIX) 40 MG tablet Take 1 tablet (40 mg total) by mouth daily.  90 tablet  1  . glucose blood test strip Use as instructed  100 each  12  . insulin detemir (LEVEMIR) 100 unit/ml SOLN Take 55 units in the morning and 40 units in the evening, and pen needles 2/day.      . isosorbide mononitrate (IMDUR) 30 MG 24 hr tablet Take 1 tablet (30 mg total) by mouth daily.  30 tablet  5  . lisinopril (PRINIVIL,ZESTRIL) 40 MG tablet Take 1 tablet (40 mg total) by mouth daily.  90 tablet  1  . Multiple Vitamin (MULTIVITAMIN) capsule Take 1 capsule by mouth daily.        . nitroGLYCERIN (NITROSTAT) 0.4 MG SL tablet Place 1 tablet (0.4 mg total) under the tongue every 5 (five) minutes as needed for chest pain.  25 tablet  2  . omeprazole (PRILOSEC) 20 MG capsule Take 1  capsule (20 mg total) by mouth daily.  90 capsule  3  . Pancrelipase, Lip-Prot-Amyl, 36000 UNITS CPEP Take 1-2 capsules by mouth See admin instructions. 1 capsule with snacks and 2 capsules with meals.      . potassium chloride (K-DUR) 10 MEQ tablet Take 1 tablet (10 mEq total) by mouth daily.  90 tablet  1  . simvastatin (ZOCOR) 20 MG tablet Take 1 tablet (20 mg total) by mouth daily at 6 PM.  30 tablet  5  . Ticagrelor (BRILINTA) 90 MG TABS tablet Take 1 tablet (90 mg total) by mouth 2 (two) times daily.  60 tablet  10   No current facility-administered medications on file prior to visit.    Allergies  Allergen Reactions  . Lipitor (Atorvastatin)     Muscle weakness    Family History  Problem Relation Age of Onset  . Diabetes Paternal Grandmother   . Diabetes Maternal Grandfather   . Colon cancer Neg Hx     BP 126/74  Pulse 80  Ht 6' (1.829 m)  Wt 197 lb (89.359 kg)  BMI 26.71 kg/m2  SpO2 98%  Review of Systems Denies weight  change and n/v    Objective:   Physical Exam VITAL SIGNS:  See vs page GENERAL: no distress SKIN:  Insulin injection sites at the anterior abdomen are normal.       Assessment & Plan:  DM: therapy limited by noncompliance.  i'll do the best i can.If hyperglycemia persists, i'll conclude that the severe hypoglycemia was due to his exceeding prescribed dosage.  This insulin regimen was chosen from multiple options, due to his need for a simple regimen.  The benefits of glycemic control must be weighed against the risks of hypoglycemia.

## 2013-02-23 NOTE — Patient Instructions (Addendum)
Please continue levemir to 45 units each morning and 40 units every evening, monday-friday.  on sat and sunday, take 55 units each am, and 40 units each evening.   on this insulin schedule, it is not safe to miss or delay meals.  It is also not safe to take extra insulin.  Please take just these amounts.   check your blood sugar 2 times a day.  vary the time of day when you check, between before the 3 meals, and at bedtime.  also check if you have symptoms of your blood sugar being too high or too low.  please keep a record of the readings and bring it to your next appointment here.  please call us sooner if you are having low blood sugar episodes.   Please come back for a follow-up appointment in 2 weeks.

## 2013-02-28 NOTE — Assessment & Plan Note (Signed)
Reason for today's appointment

## 2013-03-02 ENCOUNTER — Ambulatory Visit (INDEPENDENT_AMBULATORY_CARE_PROVIDER_SITE_OTHER): Payer: 59 | Admitting: Endocrinology

## 2013-03-02 ENCOUNTER — Encounter: Payer: Self-pay | Admitting: Endocrinology

## 2013-03-02 VITALS — BP 130/74 | HR 76 | Wt 194.0 lb

## 2013-03-02 DIAGNOSIS — E119 Type 2 diabetes mellitus without complications: Secondary | ICD-10-CM

## 2013-03-02 NOTE — Patient Instructions (Addendum)
Please continue levemir, 40 units each morning and 40 units every evening, monday-friday.  on sat and sunday, take 55 units each am, and 40 units each evening.   on this insulin schedule, it is not safe to miss or delay meals.  It is also not safe to take extra insulin.  Please take just these amounts, no matter what your blood sugar is.   check your blood sugar 2 times a day.  vary the time of day when you check, between before the 3 meals, and at bedtime.  also check if you have symptoms of your blood sugar being too high or too low.  please keep a record of the readings and bring it to your next appointment here.  please call us sooner if you are having low blood sugar episodes.   Please come back for a follow-up appointment in 2 weeks.

## 2013-03-02 NOTE — Progress Notes (Signed)
Subjective:    Patient ID: David Lane, male    DOB: 06/23/1946, 67 y.o.   MRN: [id number]  HPI Pt returns for f/u of insulin-requiring DM (dx'ed 1988, in the context of an episode of pancreatitis; he has chronic pancreatic insufficiency; he has mild if any neuropathy of the lower extremities; he has associated CAD; his last episode of severe hypoglycemia was in early may, 2014; he says this was probably due to taking more than the prescribed insulin dosage; he has never had DKA).  Since last ov, he has had no further hypoglycemia.  He takes a widely varying dosage of insulin.  He says if he has a cbg of 100 on a morning he works, and he takes 45 units that am, he will go low during the day.  Past Medical History  Diagnosis Date  . Diabetes mellitus     Type II  . Hyperlipemia   . Hypertension   . Pancreatitis   . Anxiety   . Hepatic lesion 02/04/11  . GERD (gastroesophageal reflux disease)   . CAD (coronary artery disease)     last cath by [facility] DR.  Mihai Croitoru showing  some  disease involving LCX and small size of Diag   . NSTEMI (non-ST elevated myocardial infarction) 11/21/2009  . Shortness of breath   . CHF exacerbation, due to diastolic dysfunction 07/17/2012  . Ischemic cardiomyopathy     EF 35-40%  . Liver hemangioma     Past Surgical History  Procedure Laterality Date  . Rectal surgery      cyst  . Cardiac catheterization  2011    minimal disease, medical management  . Coronary angioplasty with stent placement  07/14/2012    successful PCI & stenting of mid LAD & PDA off the dominant CX    History   Social History  . Marital Status: Single    Spouse Name: N/A    Number of Children: 0  . Years of Education: N/A   Occupational History  . [employer]   .  [employer]   Social History Main Topics  . Smoking status: Never Smoker   . Smokeless tobacco: Never Used  . Alcohol Use: No  . Drug Use: No  . Sexually Active: No   Other Topics Concern  .  Not on file   Social History Narrative  . No narrative on file    Current Outpatient Prescriptions on File Prior to Visit  Medication Sig Dispense Refill  . aspirin 81 MG tablet Take 81 mg by mouth daily.        . furosemide (LASIX) 40 MG tablet Take 1 tablet (40 mg total) by mouth daily.  90 tablet  1  . glucose blood test strip Use as instructed  100 each  12  . insulin detemir (LEVEMIR) 100 unit/ml SOLN Take 55 units in the morning and 40 units in the evening, and pen needles 2/day.      . isosorbide mononitrate (IMDUR) 30 MG 24 hr tablet Take 1 tablet (30 mg total) by mouth daily.  30 tablet  5  . lisinopril (PRINIVIL,ZESTRIL) 40 MG tablet Take 1 tablet (40 mg total) by mouth daily.  90 tablet  1  . Multiple Vitamin (MULTIVITAMIN) capsule Take 1 capsule by mouth daily.        . nitroGLYCERIN (NITROSTAT) 0.4 MG SL tablet Place 1 tablet (0.4 mg total) under the tongue every 5 (five) minutes as needed for chest pain.  25 tablet  2  . omeprazole (PRILOSEC) 20 MG capsule Take 1 capsule (20 mg total) by mouth daily.  90 capsule  3  . Pancrelipase, Lip-Prot-Amyl, 36000 UNITS CPEP Take 1-2 capsules by mouth See admin instructions. 1 capsule with snacks and 2 capsules with meals.      . potassium chloride (K-DUR) 10 MEQ tablet Take 1 tablet (10 mEq total) by mouth daily.  90 tablet  1  . simvastatin (ZOCOR) 20 MG tablet Take 1 tablet (20 mg total) by mouth daily at 6 PM.  30 tablet  5  . Ticagrelor (BRILINTA) 90 MG TABS tablet Take 1 tablet (90 mg total) by mouth 2 (two) times daily.  60 tablet  10   No current facility-administered medications on file prior to visit.    Allergies  Allergen Reactions  . Lipitor (Atorvastatin)     Muscle weakness    Family History  Problem Relation Age of Onset  . Diabetes Paternal Grandmother   . Diabetes Maternal Grandfather   . Colon cancer Neg Hx     BP 130/74  Pulse 76  Wt 194 lb (87.998 kg)  BMI 26.31 kg/m2  SpO2 98%  Review of  Systems Denies weight change and LOC    Objective:   Physical Exam VITAL SIGNS:  See vs page GENERAL: no distress     Assessment & Plan:  DM: therapy limited by noncompliance with insulin dosing.  i'll do the best i can.  This insulin regimen was chosen from multiple options, for its simplicity.  The benefits of glycemic control must be weighed against the risks of hypoglycemia.

## 2013-03-09 ENCOUNTER — Encounter: Payer: Self-pay | Admitting: Cardiovascular Disease

## 2013-03-09 ENCOUNTER — Ambulatory Visit (INDEPENDENT_AMBULATORY_CARE_PROVIDER_SITE_OTHER): Payer: 59 | Admitting: Endocrinology

## 2013-03-09 ENCOUNTER — Ambulatory Visit (INDEPENDENT_AMBULATORY_CARE_PROVIDER_SITE_OTHER): Payer: 59 | Admitting: Cardiovascular Disease

## 2013-03-09 ENCOUNTER — Encounter: Payer: Self-pay | Admitting: Endocrinology

## 2013-03-09 VITALS — BP 141/88 | HR 76 | Resp 18 | Ht 72.0 in | Wt 192.5 lb

## 2013-03-09 VITALS — BP 128/76 | HR 78 | Ht 70.0 in | Wt 193.0 lb

## 2013-03-09 DIAGNOSIS — I2589 Other forms of chronic ischemic heart disease: Secondary | ICD-10-CM

## 2013-03-09 DIAGNOSIS — I255 Ischemic cardiomyopathy: Secondary | ICD-10-CM

## 2013-03-09 DIAGNOSIS — I5042 Chronic combined systolic (congestive) and diastolic (congestive) heart failure: Secondary | ICD-10-CM

## 2013-03-09 DIAGNOSIS — I10 Essential (primary) hypertension: Secondary | ICD-10-CM

## 2013-03-09 DIAGNOSIS — I509 Heart failure, unspecified: Secondary | ICD-10-CM

## 2013-03-09 DIAGNOSIS — E119 Type 2 diabetes mellitus without complications: Secondary | ICD-10-CM

## 2013-03-09 DIAGNOSIS — I2510 Atherosclerotic heart disease of native coronary artery without angina pectoris: Secondary | ICD-10-CM

## 2013-03-09 DIAGNOSIS — E785 Hyperlipidemia, unspecified: Secondary | ICD-10-CM

## 2013-03-09 MED ORDER — GLUCOSE BLOOD VI STRP: 1.0000 | ORAL_STRIP | Freq: Two times a day (BID) | Status: DC

## 2013-03-09 MED ORDER — PEN NEEDLES 31G X 6 MM MISC: 1.0000 | Freq: Two times a day (BID) | Status: DC

## 2013-03-09 NOTE — Patient Instructions (Addendum)
Return in 4 months

## 2013-03-09 NOTE — Progress Notes (Signed)
Subjective:    Patient ID: David Lane, male    DOB: Aug 10, 1946, 67 y.o.   MRN: [id number]  HPI Pt returns for f/u of insulin-requiring DM (dx'ed 1988, in the context of an episode of pancreatitis; he has chronic pancreatic insufficiency; he has mild if any neuropathy of the lower extremities; he has associated CAD; his last episode of severe hypoglycemia was in early may, 2014; he says this was probably due to taking more than the prescribed insulin dosage; he has never had DKA).  He says he takes insulin just as prescribed.   It varies from 106-276, but most are in the mid-100's.  There is no trend throughout the day or week.   Past Medical History  Diagnosis Date  . Diabetes mellitus     Type II  . Hyperlipemia   . Hypertension   . Pancreatitis   . Anxiety   . Hepatic lesion 02/04/11  . GERD (gastroesophageal reflux disease)   . CAD (coronary artery disease)     last cath by [facility] DR.  Mihai Croitoru showing  some  disease involving LCX and small size of Diag   . NSTEMI (non-ST elevated myocardial infarction) 11/21/2009  . Shortness of breath   . CHF exacerbation, due to diastolic dysfunction 07/17/2012  . Ischemic cardiomyopathy     EF 35-40%  . Liver hemangioma     Past Surgical History  Procedure Laterality Date  . Rectal surgery      cyst  . Cardiac catheterization  2011    minimal disease, medical management  . Coronary angioplasty with stent placement  07/14/2012    successful PCI & stenting of mid LAD & PDA off the dominant CX    History   Social History  . Marital Status: Single    Spouse Name: N/A    Number of Children: 0  . Years of Education: N/A   Occupational History  . [employer]   .  [employer]   Social History Main Topics  . Smoking status: Never Smoker   . Smokeless tobacco: Never Used  . Alcohol Use: No  . Drug Use: No  . Sexually Active: No   Other Topics Concern  . Not on file   Social History Narrative  . No narrative on file     Current Outpatient Prescriptions on File Prior to Visit  Medication Sig Dispense Refill  . aspirin 81 MG tablet Take 81 mg by mouth daily.        . furosemide (LASIX) 40 MG tablet Take 1 tablet (40 mg total) by mouth daily.  90 tablet  1  . glucose blood test strip Use as instructed  100 each  12  . insulin detemir (LEVEMIR) 100 unit/ml SOLN Mon-Fri take 40U QAM & 40U QHS; Sat & Sun take 55U QAM & 40U QHS      . isosorbide mononitrate (IMDUR) 30 MG 24 hr tablet Take 1 tablet (30 mg total) by mouth daily.  30 tablet  5  . lisinopril (PRINIVIL,ZESTRIL) 40 MG tablet Take 1 tablet (40 mg total) by mouth daily.  90 tablet  1  . Multiple Vitamin (MULTIVITAMIN) capsule Take 1 capsule by mouth daily.        . nitroGLYCERIN (NITROSTAT) 0.4 MG SL tablet Place 1 tablet (0.4 mg total) under the tongue every 5 (five) minutes as needed for chest pain.  25 tablet  2  . omeprazole (PRILOSEC) 20 MG capsule Take 1 capsule (20 mg total) by  mouth daily.  90 capsule  3  . Pancrelipase, Lip-Prot-Amyl, 36000 UNITS CPEP Take 1-2 capsules by mouth See admin instructions. 1 capsule with snacks and 2 capsules with meals.      . potassium chloride (K-DUR) 10 MEQ tablet Take 1 tablet (10 mEq total) by mouth daily.  90 tablet  1  . simvastatin (ZOCOR) 20 MG tablet Take 1 tablet (20 mg total) by mouth daily at 6 PM.  30 tablet  5  . Ticagrelor (BRILINTA) 90 MG TABS tablet Take 1 tablet (90 mg total) by mouth 2 (two) times daily.  60 tablet  10  . amLODipine (NORVASC) 5 MG tablet Take 5 mg by mouth daily.      . carvedilol (COREG) 12.5 MG tablet Take 12.5 mg by mouth 2 (two) times daily.       No current facility-administered medications on file prior to visit.    Allergies  Allergen Reactions  . Lipitor (Atorvastatin)     Muscle weakness   Family History  Problem Relation Age of Onset  . Diabetes Paternal Grandmother   . Diabetes Maternal Grandfather   . Colon cancer Neg Hx    BP 128/76  Pulse 78  Ht 5\' 10"   (1.778 m)  Wt 193 lb (87.544 kg)  BMI 27.69 kg/m2  SpO2 98%  Review of Systems Since last ov, he has had no further hypoglycemia.  Denies weight change.      Objective:   Physical Exam VITAL SIGNS:  See vs page.   GENERAL: no distress.  SKIN:  Insulin injection sites at the anterior abdomen are normal.      he brings a record of his cbg's which i have reviewed today.    Assessment & Plan:  DM: This insulin regimen was chosen from multiple options, due to its simplicity.  The benefits of glycemic control must be weighed against the risks of hypoglycemia.  Control is much better now.

## 2013-03-09 NOTE — Patient Instructions (Addendum)
Please continue levemir, 40 units each morning and 40 units every evening, monday-friday.  on sat and sunday, take 55 units each am, and 40 units each evening.   on this insulin schedule, it is not safe to miss or delay meals.  It is also not safe to take extra insulin.  Please take just these amounts, no matter what your blood sugar is.   check your blood sugar 2 times a day.  vary the time of day when you check, between before the 3 meals, and at bedtime.  also check if you have symptoms of your blood sugar being too high or too low.  please keep a record of the readings and bring it to your next appointment here.  please call us sooner if you are having low blood sugar episodes.   Please come back for a follow-up appointment in 1 month.

## 2013-03-10 ENCOUNTER — Encounter: Payer: Self-pay | Admitting: Cardiovascular Disease

## 2013-03-11 ENCOUNTER — Encounter: Payer: Self-pay | Admitting: Cardiovascular Disease

## 2013-03-11 NOTE — Progress Notes (Signed)
Patient ID: David Lane, male   DOB: [birth year], 67 y.o.   MRN: [id number]      Reason for office visit Congestive heart failure, coronary artery disease  Demarco returns for followup and is fairly doing well. His blood pressure remains rather volatile and depends heavily on how upset he is. He is a anxious gentleman and easily becomes upset. Today when he woke up his blood pressure was 134/71. When he rechecked his blood pressure at 2 PM it was 120/70. When he came to our office his blood pressure was initially quite high but has improved a little bit. We have checked his home blood pressure monitor in our office and it is accurate.  He worries a lot about his heart problems but generally feels well. He is working full time and has to walk long distances at work. She does not feel in any way encumbered unlimited by shortness of breath and he has not had any chest pain. He has noticed his blood pressure is much lower after he goes for walks.    Allergies  Allergen Reactions  . Lipitor (Atorvastatin)     Muscle weakness    Current Outpatient Prescriptions  Medication Sig Dispense Refill  . aspirin 81 MG tablet Take 81 mg by mouth daily.        . furosemide (LASIX) 40 MG tablet Take 1 tablet (40 mg total) by mouth daily.  90 tablet  1  . glucose blood (ACCU-CHEK AVIVA PLUS) test strip 1 each by Other route 2 (two) times daily. And lancets 2/day  60 each  12  . glucose blood test strip Use as instructed  100 each  12  . insulin detemir (LEVEMIR) 100 unit/ml SOLN Mon-Fri take 40U QAM & 40U QHS; Sat & Sun take 55U QAM & 40U QHS      . Insulin Pen Needle (PEN NEEDLES) 31G X 6 MM MISC 1 Device by Does not apply route 2 (two) times daily.  60 each  11  . isosorbide mononitrate (IMDUR) 30 MG 24 hr tablet Take 1 tablet (30 mg total) by mouth daily.  30 tablet  5  . lisinopril (PRINIVIL,ZESTRIL) 40 MG tablet Take 1 tablet (40 mg total) by mouth daily.  90 tablet  1  . Multiple Vitamin  (MULTIVITAMIN) capsule Take 1 capsule by mouth daily.        . nitroGLYCERIN (NITROSTAT) 0.4 MG SL tablet Place 1 tablet (0.4 mg total) under the tongue every 5 (five) minutes as needed for chest pain.  25 tablet  2  . omeprazole (PRILOSEC) 20 MG capsule Take 1 capsule (20 mg total) by mouth daily.  90 capsule  3  . Pancrelipase, Lip-Prot-Amyl, 36000 UNITS CPEP Take 1-2 capsules by mouth See admin instructions. 1 capsule with snacks and 2 capsules with meals.      . potassium chloride (K-DUR) 10 MEQ tablet Take 1 tablet (10 mEq total) by mouth daily.  90 tablet  1  . simvastatin (ZOCOR) 20 MG tablet Take 1 tablet (20 mg total) by mouth daily at 6 PM.  30 tablet  5  . Ticagrelor (BRILINTA) 90 MG TABS tablet Take 1 tablet (90 mg total) by mouth 2 (two) times daily.  60 tablet  10  . amLODipine (NORVASC) 5 MG tablet Take 5 mg by mouth daily.      . carvedilol (COREG) 12.5 MG tablet Take 12.5 mg by mouth 2 (two) times daily.       No current  facility-administered medications for this visit.    Past Medical History  Diagnosis Date  . Diabetes mellitus     Type II  . Hyperlipemia   . Hypertension   . Pancreatitis   . Anxiety   . Hepatic lesion 02/04/11  . GERD (gastroesophageal reflux disease)   . CAD (coronary artery disease)     last cath by [facility] DR.  Briane Birden showing  some  disease involving LCX and small size of Diag   . NSTEMI (non-ST elevated myocardial infarction) 11/21/2009  . Shortness of breath   . CHF exacerbation, due to diastolic dysfunction 07/17/2012  . Ischemic cardiomyopathy     EF 35-40%  . Liver hemangioma     Past Surgical History  Procedure Laterality Date  . Rectal surgery      cyst  . Cardiac catheterization  2011    minimal disease, medical management  . Coronary angioplasty with stent placement  07/14/2012    successful PCI & stenting of mid LAD & PDA off the dominant CX    Family History  Problem Relation Age of Onset  . Diabetes Paternal  Grandmother   . Diabetes Maternal Grandfather   . Colon cancer Neg Hx     History   Social History  . Marital Status: Single    Spouse Name: N/A    Number of Children: 0  . Years of Education: N/A   Occupational History  . [employer]   .  [employer]   Social History Main Topics  . Smoking status: Never Smoker   . Smokeless tobacco: Never Used  . Alcohol Use: No  . Drug Use: No  . Sexually Active: No   Other Topics Concern  . Not on file   Social History Narrative  . No narrative on file    Review of systems: The patient specifically denies any chest pain at rest or with exertion, dyspnea at rest or with exertion, orthopnea, paroxysmal nocturnal dyspnea, syncope, palpitations, focal neurological deficits, intermittent claudication, lower extremity edema, unexplained weight gain, cough, hemoptysis or wheezing.  The patient also denies abdominal pain, nausea, vomiting, dysphagia, diarrhea, constipation, polyuria, polydipsia, dysuria, hematuria, frequency, urgency, abnormal bleeding or bruising, fever, chills, unexpected weight changes, mood swings, change in skin or hair texture, change in voice quality, auditory or visual problems, allergic reactions or rashes, new musculoskeletal complaints other than usual "aches and pains".   PHYSICAL EXAM BP 141/88  Pulse 76  Resp 18  Ht 6' (1.829 m)  Wt 192 lb 8 oz (87.317 kg)  BMI 26.1 kg/m2  General: Alert, oriented x3, no distress Head: no evidence of trauma, PERRL, EOMI, no exophtalmos or lid lag, no myxedema, no xanthelasma; normal ears, nose and oropharynx Neck: normal jugular venous pulsations and no hepatojugular reflux; brisk carotid pulses without delay and no carotid bruits Chest: clear to auscultation, no signs of consolidation by percussion or palpation, normal fremitus, symmetrical and full respiratory excursions Cardiovascular: normal position and quality of the apical impulse, regular rhythm, normal first and  second heart sounds, no murmurs, rubs or gallops Abdomen: no tenderness or distention, no masses by palpation, no abnormal pulsatility or arterial bruits, normal bowel sounds, no hepatosplenomegaly Extremities: no clubbing, cyanosis or edema; 2+ radial, ulnar and brachial pulses bilaterally; 2+ right femoral, posterior tibial and dorsalis pedis pulses; 2+ left femoral, posterior tibial and dorsalis pedis pulses; no subclavian or femoral bruits Neurological: grossly nonfocal   EKG: Sinus rhythm  Lipid Panel  Component Value Date/Time   CHOL 66 03/01/2011 1602   TRIG 37.0 03/01/2011 1602   HDL 24.40* 03/01/2011 1602   CHOLHDL 3 03/01/2011 1602   VLDL 7.4 03/01/2011 1602   LDLCALC 34 03/01/2011 1602    BMET    Component Value Date/Time   NA 144 02/12/2013 0657   K 3.9 02/12/2013 0657   CL 101 02/12/2013 0657   CO2 25 10/18/2012 1525   GLUCOSE 131* 02/12/2013 0657   BUN 18 02/12/2013 0657   CREATININE 0.80 02/12/2013 0657   CALCIUM 9.2 10/18/2012 1525   GFRNONAA >90 10/18/2012 1525   GFRAA >90 10/18/2012 1525     ASSESSMENT AND PLAN CAD, 07/14/12- LAD/PDA DES Mr. Laneve has not had any problems with angina pectoris since his last appointment. He has had several visits to our office for blood pressure medication adjustment. Chest pain has not been a complaint that any of these visits. He denies any exertional angina. He needs to continue dual antiplatelet therapy until this October. Any elective surgical procedures that require stopping antiplatelet medications should be deferred until that time  NSTEMI Feb 2011- medical Rx 07/14/2012 2.5 x 50 mm expedition drug-eluting stent the mid LAD and 2.5 x 18 mm expedition drug-eluting stent to the posterior descending artery branch of the circumflex vessel coronary artery  Ischemic cardiomyopathy, EF 35-40% by echo 07/14/12 Ejection fraction 35-40%, on appropriate medical therapy  Chronic combined systolic and diastolic CHF (congestive heart  failure) On appropriate therapy with carvedilol and ACE inhibitors. NYHA class I, euvolemic. He has had previous hospitalization with acute pulmonary edema. Currently he appears to be well compensated, NYHA functional class I and euvolemic on no diuretic therapy. He is reminded about the importance of sodium restriction and daily weight monitoring.  HYPERTENSION It has been difficult to identify a regimen of antihypertensive meds that works for [employer], especially since his blood pressure is very much influenced by his state of mind. He gets extremely anxious at times which sets off a cycle of worsening blood pressure accompanied by worsening anxiety. He has had readings at home they have been as low as the 110/80 range. He has noticed a clear correlation between how, he is and what his blood pressure is. We discussed the importance of relaxation techniques and regular physical exercise at a moderate pace. No changes are made to his medications today.  HYPERLIPIDEMIA Except for low HDL cholesterol his lipid profile is excellent. We discussed importance of avoiding sugars and starches with low glycemic index as well as avoiding saturated fat, while increasing his intake of unsaturated fat. Sustained moderate physical activities also beneficial.  No orders of the defined types were placed in this encounter.   Meds ordered this encounter  Medications  . amLODipine (NORVASC) 5 MG tablet    Sig: Take 5 mg by mouth daily.  . carvedilol (COREG) 12.5 MG tablet    Sig: Take 12.5 mg by mouth 2 (two) times daily.    Junious Silk, MD, [facility] [phone] office [phone] pager

## 2013-03-11 NOTE — Assessment & Plan Note (Signed)
Except for low HDL cholesterol his lipid profile is excellent. We discussed importance of avoiding sugars and starches with low glycemic index as well as avoiding saturated fat, while increasing his intake of unsaturated fat. Sustained moderate physical activities also beneficial.

## 2013-03-11 NOTE — Assessment & Plan Note (Addendum)
On appropriate therapy with carvedilol and ACE inhibitors. NYHA class I, euvolemic. He has had previous hospitalization with acute pulmonary edema. Currently he appears to be well compensated, NYHA functional class I and euvolemic on very little diuretic therapy. He is reminded about the importance of sodium restriction and daily weight monitoring.

## 2013-03-11 NOTE — Assessment & Plan Note (Addendum)
Ejection fraction 35-40%, on appropriate medical therapy

## 2013-03-11 NOTE — Assessment & Plan Note (Addendum)
Mr. Regina has not had any problems with angina pectoris since his last appointment. He has had several visits to our office for blood pressure medication adjustment. Chest pain has not been a complaint that any of these visits. He denies any exertional angina. He needs to continue dual antiplatelet therapy until this October. Any elective surgical procedures that require stopping antiplatelet medications should be deferred until that time  NSTEMI Feb 2011- medical Rx 07/14/2012 2.5 x 50 mm expedition drug-eluting stent the mid LAD and 2.5 x 18 mm expedition drug-eluting stent to the posterior descending artery branch of the circumflex vessel coronary artery

## 2013-03-11 NOTE — Assessment & Plan Note (Addendum)
It has been difficult to identify a regimen of antihypertensive meds that works for David Lane, especially since his blood pressure is very much influenced by his state of mind. He gets extremely anxious at times which sets off a cycle of worsening blood pressure accompanied by worsening anxiety. He has had readings at home they have been as low as the 110/80 range. He has noticed a clear correlation between how, he is and what his blood pressure is. We discussed the importance of relaxation techniques and regular physical exercise at a moderate pace. No changes are made to his medications today.

## 2013-03-16 ENCOUNTER — Ambulatory Visit (INDEPENDENT_AMBULATORY_CARE_PROVIDER_SITE_OTHER): Payer: 59 | Admitting: Pharmacist Clinician (PhC)/ Clinical Pharmacy Specialist

## 2013-03-16 ENCOUNTER — Encounter: Payer: Self-pay | Admitting: Pharmacist Clinician (PhC)/ Clinical Pharmacy Specialist

## 2013-03-16 VITALS — BP 136/80 | HR 80

## 2013-03-16 DIAGNOSIS — I10 Essential (primary) hypertension: Secondary | ICD-10-CM

## 2013-03-16 MED ORDER — LISINOPRIL 20 MG PO TABS: 20.0000 mg | ORAL_TABLET | Freq: Two times a day (BID) | ORAL | Status: DC

## 2013-03-16 NOTE — Progress Notes (Signed)
HPI:  David Lane is a 67 y.o. male with a PMH below who presents today for a follow up of his blood pressure. Pt has a history of hypertension, with morning readings often higher than afternoon or evening. Pt has a measure of anxiety with readings, concerned about having a stroke when readings are 180+/100.  Fortunately he hasn't had any readings that high in recent weeks.  Most home readings are 130-150/80-90s.  He is currently taking lisinopril 20mg  twice daily rather than 40mg  once daily, this is fine, his prescription will be adjusted accordingly today. He is feeling well today, although he had a bout of food poisoning earlier this week.  He is also feeling as though his BP is under good control, which brings his level of anxiety down some.    Current Outpatient Prescriptions  Medication Sig Dispense Refill  . amLODipine (NORVASC) 5 MG tablet Take 5 mg by mouth daily.      Marland Kitchen aspirin 81 MG tablet Take 81 mg by mouth daily.        . carvedilol (COREG) 12.5 MG tablet Take 12.5 mg by mouth 2 (two) times daily.      . furosemide (LASIX) 40 MG tablet Take 1 tablet (40 mg total) by mouth daily.  90 tablet  1  . glucose blood (ACCU-CHEK AVIVA PLUS) test strip 1 each by Other route 2 (two) times daily. And lancets 2/day  60 each  12  . glucose blood test strip Use as instructed  100 each  12  . insulin detemir (LEVEMIR) 100 unit/ml SOLN Mon-Fri take 40U QAM & 40U QHS; Sat & Sun take 55U QAM & 40U QHS      . Insulin Pen Needle (PEN NEEDLES) 31G X 6 MM MISC 1 Device by Does not apply route 2 (two) times daily.  60 each  11  . isosorbide mononitrate (IMDUR) 30 MG 24 hr tablet Take 1 tablet (30 mg total) by mouth daily.  30 tablet  5  . lisinopril (PRINIVIL,ZESTRIL) 20 MG tablet Take 1 tablet (20 mg total) by mouth 2 (two) times daily.  60 tablet  6  . Multiple Vitamin (MULTIVITAMIN) capsule Take 1 capsule by mouth daily.        . nitroGLYCERIN (NITROSTAT) 0.4 MG SL tablet Place 1 tablet (0.4 mg total)  under the tongue every 5 (five) minutes as needed for chest pain.  25 tablet  2  . omeprazole (PRILOSEC) 20 MG capsule Take 1 capsule (20 mg total) by mouth daily.  90 capsule  3  . Pancrelipase, Lip-Prot-Amyl, 36000 UNITS CPEP Take 1-2 capsules by mouth See admin instructions. 1 capsule with snacks and 2 capsules with meals.      . potassium chloride (K-DUR) 10 MEQ tablet Take 1 tablet (10 mEq total) by mouth daily.  90 tablet  1  . simvastatin (ZOCOR) 20 MG tablet Take 1 tablet (20 mg total) by mouth daily at 6 PM.  30 tablet  5  . Ticagrelor (BRILINTA) 90 MG TABS tablet Take 1 tablet (90 mg total) by mouth 2 (two) times daily.  60 tablet  10   No current facility-administered medications for this visit.    Allergies  Allergen Reactions  . Lipitor (Atorvastatin)     Muscle weakness    Past Medical History  Diagnosis Date  . Diabetes mellitus     Type II  . Hyperlipemia   . Hypertension   . Pancreatitis   . Anxiety   .  Hepatic lesion 02/04/11  . GERD (gastroesophageal reflux disease)   . CAD (coronary artery disease)     last cath by [facility] DR.  Mihai Croitoru showing  some  disease involving LCX and small size of Diag   . NSTEMI (non-ST elevated myocardial infarction) 11/21/2009  . Shortness of breath   . CHF exacerbation, due to diastolic dysfunction 07/17/2012  . Ischemic cardiomyopathy     EF 35-40%  . Liver hemangioma     BP 136/80  Pulse 80 (standing)       134/80  (seated  = average of 3 readings)  ASSESSMENT AND PLAN: We seem to have his BP under good control at this time.  Being diabetic I would like to keep his BP <140/80 based on ADA guidelines. He is somewhat anxious about going 4 months until his next visit with Dr. Salena Saner, but I assured him that if he notices his pressure going up and consistently >160/100 he can call and we will get him back into the office for another appointment.

## 2013-03-16 NOTE — Patient Instructions (Addendum)
Your blood pressure today is good at 130/76. Check your blood pressure at home daily (if able) and keep record of the readings.  Take your BP meds as follows:  AM: carvedilol 12.5mg          Lisinopril 20mg    PM: carvedilol 12.5mg          Lisinopril 20mg          Amlodipine 5mg     Bring all of your meds, your BP cuff and your record of home blood pressures to your next appointment.  Exercise as you're able, try to walk approximately 30 minutes per day.  Keep salt intake to a minimum, especially watch canned and prepared boxed foods.  Eat more fresh fruits and vegetables and fewer canned items.  Avoid eating in fast food restaurants.    HOW TO TAKE YOUR BLOOD PRESSURE:   Rest 5 minutes before taking your blood pressure.    Don't smoke or drink caffeinated beverages for at least 30 minutes before.   Take your blood pressure before (not after) you eat.   Sit comfortably with your back supported and both feet on the floor (don't cross your legs).   Elevate your arm to heart level on a table or a desk.   Use the proper sized cuff. It should fit smoothly and snugly around your bare upper arm. There should be enough room to slip a fingertip under the cuff. The bottom edge of the cuff should be 1 inch above the crease of the elbow.   Ideally, take 3 measurements at one sitting and record the average.

## 2013-03-20 ENCOUNTER — Ambulatory Visit (INDEPENDENT_AMBULATORY_CARE_PROVIDER_SITE_OTHER): Payer: 59 | Admitting: Internal Medicine

## 2013-03-20 ENCOUNTER — Encounter: Payer: Self-pay | Admitting: Internal Medicine

## 2013-03-20 VITALS — BP 150/82 | HR 80 | Ht 72.0 in | Wt 189.4 lb

## 2013-03-20 DIAGNOSIS — K219 Gastro-esophageal reflux disease without esophagitis: Secondary | ICD-10-CM

## 2013-03-20 DIAGNOSIS — K8689 Other specified diseases of pancreas: Secondary | ICD-10-CM

## 2013-03-20 DIAGNOSIS — K8681 Exocrine pancreatic insufficiency: Secondary | ICD-10-CM

## 2013-03-20 NOTE — Patient Instructions (Addendum)
Dr Tawanna Cooler, Dr Royann Shivers

## 2013-03-20 NOTE — Progress Notes (Signed)
David Lane 1946-02-08 MRN [id number]        History of Present Illness:  This is a 67 year old white male with chronic calcific pancreatitis and pancreatic insufficiency causing diabetes as well as maldigestion. He presented with diarrhea approximately 6 weeks ago and his pancreatic enzymes were doubled up he is currently on 36,000 units 8 pills a day=. 2 tablets with each meal with complete resolution of his diarrhea. He has also gained 8 pounds. The only problem now  seems to be a lot of gas and belching and gastroesophageal reflux which was previously documented on upper endoscopy 2012. He had colonoscopy at that time as well. He ran out of Prilosec 20 mg a day. Has not taken it now for 2 weeks. He feels that he needs a stronger medication for acid reflux   Past Medical History  Diagnosis Date  . Diabetes mellitus     Type II  . Hyperlipemia   . Hypertension   . Pancreatitis   . Anxiety   . Hepatic lesion 02/04/11  . GERD (gastroesophageal reflux disease)   . CAD (coronary artery disease)     last cath by [facility] DR.  Mihai Croitoru showing  some  disease involving LCX and small size of Diag   . NSTEMI (non-ST elevated myocardial infarction) 11/21/2009  . Shortness of breath   . CHF exacerbation, due to diastolic dysfunction 07/17/2012  . Ischemic cardiomyopathy     EF 35-40%  . Liver hemangioma    Past Surgical History  Procedure Laterality Date  . Rectal surgery      cyst  . Cardiac catheterization  2011    minimal disease, medical management  . Coronary angioplasty with stent placement  07/14/2012    successful PCI & stenting of mid LAD & PDA off the dominant CX    reports that he has never smoked. He has never used smokeless tobacco. He reports that he does not drink alcohol or use illicit drugs. family history includes Diabetes in his maternal grandfather and paternal grandmother.  There is no history of Colon cancer. Allergies  Allergen Reactions  . Lipitor  (Atorvastatin)     Muscle weakness        Review of Systems: Negative for dysphagia. Positive for weight gain negative for diarrhea  The remainder of the 10 point ROS is negative except as outlined in H&P   Physical Exam: General appearance  Well developed, in no distress. Eyes- non icteric. HEENT nontraumatic, normocephalic. Mouth no lesions, tongue papillated, no cheilosis. Neck supple without adenopathy, thyroid not enlarged, no carotid bruits, no JVD. Lungs Clear to auscultation bilaterally. Cor normal S1, normal S2, regular rhythm, no murmur,  quiet precordium. Abdomen: Soft nontender with hyperactive bowel sounds. No palpable mass Rectal: Not done Extremities no pedal edema. Skin no lesions. Neurological alert and oriented x 3. Psychological normal mood and affect.  Assessment and Plan:  67 year old white male with the exocrine pancreatic insufficiency due to chronic pancreatitis. Dilated main pancreatic duct on CT scan in 2012.Marland Kitchen History of large pseudocyst whichwas drained.in 2008. He has responded to increased dose of pancreatic enzymes. He will continue on the same regimen. We will add Gaviscon 2 tablets with each meal. We will also increase his  Prilosec to 20 mg twice a day. He will come back in 6-8 weeks. We will consider repeat upper endoscopy or gastric emptying scan if his symptoms do not improve   03/20/2013 David Lane

## 2013-03-21 ENCOUNTER — Other Ambulatory Visit: Payer: Self-pay | Admitting: Internal Medicine

## 2013-03-21 MED ORDER — PANCRELIPASE (LIP-PROT-AMYL) 36000-114000 UNITS PO CPEP: 2.0000 | ORAL_CAPSULE | ORAL | Status: DC

## 2013-03-21 MED ORDER — OMEPRAZOLE 20 MG PO CPDR: 20.0000 mg | DELAYED_RELEASE_CAPSULE | Freq: Two times a day (BID) | ORAL | Status: DC

## 2013-04-13 ENCOUNTER — Encounter: Payer: Self-pay | Admitting: Endocrinology

## 2013-04-13 ENCOUNTER — Ambulatory Visit (INDEPENDENT_AMBULATORY_CARE_PROVIDER_SITE_OTHER): Payer: 59 | Admitting: Endocrinology

## 2013-04-13 VITALS — BP 144/80 | HR 87 | Temp 98.4°F | Ht 70.0 in | Wt 193.0 lb

## 2013-04-13 DIAGNOSIS — E119 Type 2 diabetes mellitus without complications: Secondary | ICD-10-CM

## 2013-04-13 NOTE — Patient Instructions (Addendum)
Please continue levemir, 40 units each morning and 40 units every evening, monday-friday.  on sat and sunday, take 55 units each am, and 40 units each evening.   on this insulin schedule, it is not safe to miss or delay meals.  It is also not safe to take extra insulin.  Please take just these amounts, no matter what your blood sugar is.   check your blood sugar 2 times a day.  vary the time of day when you check, between before the 3 meals, and at bedtime.  also check if you have symptoms of your blood sugar being too high or too low.  please keep a record of the readings and bring it to your next appointment here.  please call us sooner if you are having low blood sugar episodes.   blood tests are being requested for you today.  We'll contact you with results.  Please come back for a follow-up appointment in 6 weeks.

## 2013-04-13 NOTE — Progress Notes (Signed)
Subjective:    Patient ID: David Lane, male    DOB: 1946/09/10, 67 y.o.   MRN: [id number]  HPI Pt returns for f/u of insulin-requiring DM (dx'ed 1988, in the context of an episode of pancreatitis; he has chronic pancreatic insufficiency; he has mild if any neuropathy of the lower extremities; he has associated CAD; his last episode of severe hypoglycemia was in early may, 2014; he says this was probably due to taking more than the prescribed insulin dosage; he has never had DKA).  He says he now takes insulin exactly as prescribed.  he brings a scant record of his cbg's which i have reviewed today.  It varies from 106-200's, but most are in the 100's. Since last ov, he had 1 episode of hypoglycemia, and it was mild.  It happened at 05:30, while he was on vacation.  On the previous evening, he had taken his pm insulin very late at night.  He is unable to cite any other reason for the episode.   Past Medical History  Diagnosis Date  . Diabetes mellitus     Type II  . Hyperlipemia   . Hypertension   . Pancreatitis   . Anxiety   . Hepatic lesion 02/04/11  . GERD (gastroesophageal reflux disease)   . CAD (coronary artery disease)     last cath by [facility] DR.  Mihai Croitoru showing  some  disease involving LCX and small size of Diag   . NSTEMI (non-ST elevated myocardial infarction) 11/21/2009  . Shortness of breath   . CHF exacerbation, due to diastolic dysfunction 07/17/2012  . Ischemic cardiomyopathy     EF 35-40%  . Liver hemangioma     Past Surgical History  Procedure Laterality Date  . Rectal surgery      cyst  . Cardiac catheterization  2011    minimal disease, medical management  . Coronary angioplasty with stent placement  07/14/2012    successful PCI & stenting of mid LAD & PDA off the dominant CX    History   Social History  . Marital Status: Single    Spouse Name: N/A    Number of Children: 0  . Years of Education: N/A   Occupational History  . [employer]    .  [employer]   Social History Main Topics  . Smoking status: Never Smoker   . Smokeless tobacco: Never Used  . Alcohol Use: No  . Drug Use: No  . Sexually Active: No   Other Topics Concern  . Not on file   Social History Narrative  . No narrative on file    Current Outpatient Prescriptions on File Prior to Visit  Medication Sig Dispense Refill  . amLODipine (NORVASC) 5 MG tablet Take 5 mg by mouth daily.      Marland Kitchen aspirin 81 MG tablet Take 81 mg by mouth daily.        . carvedilol (COREG) 12.5 MG tablet Take 12.5 mg by mouth 2 (two) times daily.      . furosemide (LASIX) 40 MG tablet Take 1 tablet (40 mg total) by mouth daily.  90 tablet  1  . glucose blood (ACCU-CHEK AVIVA PLUS) test strip 1 each by Other route 2 (two) times daily. And lancets 2/day  60 each  12  . glucose blood test strip Use as instructed  100 each  12  . insulin detemir (LEVEMIR) 100 unit/ml SOLN Mon-Fri take 40U QAM & 40U QHS; Sat & Wynelle Link take  55U QAM & 40U QHS      . Insulin Pen Needle (PEN NEEDLES) 31G X 6 MM MISC 1 Device by Does not apply route 2 (two) times daily.  60 each  11  . isosorbide mononitrate (IMDUR) 30 MG 24 hr tablet Take 1 tablet (30 mg total) by mouth daily.  30 tablet  5  . lisinopril (PRINIVIL,ZESTRIL) 20 MG tablet Take 1 tablet (20 mg total) by mouth 2 (two) times daily.  60 tablet  6  . Multiple Vitamin (MULTIVITAMIN) capsule Take 1 capsule by mouth daily.        . nitroGLYCERIN (NITROSTAT) 0.4 MG SL tablet Place 1 tablet (0.4 mg total) under the tongue every 5 (five) minutes as needed for chest pain.  25 tablet  2  . omeprazole (PRILOSEC) 20 MG capsule Take 1 capsule (20 mg total) by mouth 2 (two) times daily.  180 capsule  1  . Pancrelipase, Lip-Prot-Amyl, 36000 UNITS CPEP Take 2 capsules by mouth See admin instructions. Take 2 capsules by mouth three times daily with meals and 1 capsule with each snack.  720 capsule  0  . potassium chloride (K-DUR) 10 MEQ tablet Take 1 tablet (10 mEq  total) by mouth daily.  90 tablet  1  . simvastatin (ZOCOR) 20 MG tablet Take 1 tablet (20 mg total) by mouth daily at 6 PM.  30 tablet  5  . Ticagrelor (BRILINTA) 90 MG TABS tablet Take 1 tablet (90 mg total) by mouth 2 (two) times daily.  60 tablet  10   No current facility-administered medications on file prior to visit.    Allergies  Allergen Reactions  . Lipitor (Atorvastatin)     Muscle weakness   Family History  Problem Relation Age of Onset  . Diabetes Paternal Grandmother   . Diabetes Maternal Grandfather   . Colon cancer Neg Hx    BP 144/80  Pulse 87  Temp(Src) 98.4 F (36.9 C) (Oral)  Ht 5\' 10"  (1.778 m)  Wt 193 lb (87.544 kg)  BMI 27.69 kg/m2  SpO2 97%  Review of Systems denies LOC.  Denies weight change.      Objective:   Physical Exam VITAL SIGNS:  See vs page.   GENERAL: no distress.         Assessment & Plan:  DM: This insulin regimen was chosen from multiple options, due to its simplicity.  The benefits of glycemic control must be weighed against the risks of hypoglycemia.  Control is still improved from what it was a few months ago.

## 2013-04-17 ENCOUNTER — Other Ambulatory Visit: Payer: Self-pay | Admitting: *Deleted

## 2013-04-17 MED ORDER — SIMVASTATIN 40 MG PO TABS: 40.0000 mg | ORAL_TABLET | Freq: Every day | ORAL | Status: DC

## 2013-05-08 ENCOUNTER — Other Ambulatory Visit: Payer: Self-pay

## 2013-05-08 MED ORDER — GLUCOSE BLOOD VI STRP: 1.0000 | ORAL_STRIP | Freq: Two times a day (BID) | Status: DC

## 2013-05-09 ENCOUNTER — Other Ambulatory Visit: Payer: Self-pay | Admitting: *Deleted

## 2013-05-09 ENCOUNTER — Other Ambulatory Visit: Payer: Self-pay

## 2013-05-09 MED ORDER — INSULIN DETEMIR 100 UNIT/ML FLEXPEN: SUBCUTANEOUS | Status: DC

## 2013-05-09 NOTE — Telephone Encounter (Signed)
Pt requested 3 month supply.

## 2013-06-30 ENCOUNTER — Other Ambulatory Visit: Payer: Self-pay | Admitting: Gastroenterology

## 2013-07-02 NOTE — Telephone Encounter (Signed)
David Lane Patient David Lane

## 2013-07-12 ENCOUNTER — Emergency Department (HOSPITAL_COMMUNITY)
Admission: EM | Admit: 2013-07-12 | Discharge: 2013-07-12 | Disposition: A | Discharge status: Home / Self Care | Payer: 59 | Attending: Emergency Medicine | Admitting: Emergency Medicine

## 2013-07-12 ENCOUNTER — Emergency Department (HOSPITAL_COMMUNITY): Payer: 59

## 2013-07-12 ENCOUNTER — Encounter (HOSPITAL_COMMUNITY): Payer: Self-pay | Admitting: Emergency Medicine

## 2013-07-12 ENCOUNTER — Telehealth: Payer: Self-pay | Admitting: Cardiovascular Disease

## 2013-07-12 ENCOUNTER — Ambulatory Visit: Payer: 59 | Admitting: Cardiology

## 2013-07-12 DIAGNOSIS — Z8505 Personal history of malignant neoplasm of liver: Secondary | ICD-10-CM | POA: Insufficient documentation

## 2013-07-12 DIAGNOSIS — I10 Essential (primary) hypertension: Secondary | ICD-10-CM

## 2013-07-12 DIAGNOSIS — E162 Hypoglycemia, unspecified: Secondary | ICD-10-CM

## 2013-07-12 DIAGNOSIS — I2510 Atherosclerotic heart disease of native coronary artery without angina pectoris: Secondary | ICD-10-CM | POA: Insufficient documentation

## 2013-07-12 DIAGNOSIS — R110 Nausea: Secondary | ICD-10-CM | POA: Insufficient documentation

## 2013-07-12 DIAGNOSIS — E785 Hyperlipidemia, unspecified: Secondary | ICD-10-CM | POA: Insufficient documentation

## 2013-07-12 DIAGNOSIS — E1169 Type 2 diabetes mellitus with other specified complication: Secondary | ICD-10-CM | POA: Insufficient documentation

## 2013-07-12 DIAGNOSIS — F411 Generalized anxiety disorder: Secondary | ICD-10-CM | POA: Insufficient documentation

## 2013-07-12 DIAGNOSIS — Z9861 Coronary angioplasty status: Secondary | ICD-10-CM | POA: Insufficient documentation

## 2013-07-12 DIAGNOSIS — I252 Old myocardial infarction: Secondary | ICD-10-CM | POA: Insufficient documentation

## 2013-07-12 DIAGNOSIS — K219 Gastro-esophageal reflux disease without esophagitis: Secondary | ICD-10-CM | POA: Insufficient documentation

## 2013-07-12 DIAGNOSIS — Z794 Long term (current) use of insulin: Secondary | ICD-10-CM | POA: Insufficient documentation

## 2013-07-12 DIAGNOSIS — Z79899 Other long term (current) drug therapy: Secondary | ICD-10-CM | POA: Insufficient documentation

## 2013-07-12 DIAGNOSIS — Z7982 Long term (current) use of aspirin: Secondary | ICD-10-CM | POA: Insufficient documentation

## 2013-07-12 DIAGNOSIS — I509 Heart failure, unspecified: Secondary | ICD-10-CM | POA: Insufficient documentation

## 2013-07-12 DIAGNOSIS — R61 Generalized hyperhidrosis: Secondary | ICD-10-CM | POA: Insufficient documentation

## 2013-07-12 LAB — GLUCOSE, CAPILLARY
Glucose-Capillary: 64 mg/dL — ABNORMAL LOW (ref 70–99)
Glucose-Capillary: 67 mg/dL — ABNORMAL LOW (ref 70–99)
Glucose-Capillary: 63 mg/dL — ABNORMAL LOW (ref 70–99)
Glucose-Capillary: 112 mg/dL — ABNORMAL HIGH (ref 70–99)
Glucose-Capillary: 59 mg/dL — ABNORMAL LOW (ref 70–99)

## 2013-07-12 LAB — COMPREHENSIVE METABOLIC PANEL
ALT: 49 U/L (ref 0–53)
AST: 43 U/L — ABNORMAL HIGH (ref 0–37)
Alkaline Phosphatase: 86 U/L (ref 39–117)
CO2: 27 mEq/L (ref 19–32)
Chloride: 103 mEq/L (ref 96–112)
Creatinine, Ser: 0.57 mg/dL (ref 0.50–1.35)
GFR calc non Af Amer: >90 mL/min (ref >90)
Potassium: 3.3 mEq/L — ABNORMAL LOW (ref 3.5–5.1)
Total Bilirubin: 0.6 mg/dL (ref 0.3–1.2)

## 2013-07-12 LAB — CBC
Hemoglobin: 12.8 g/dL — ABNORMAL LOW (ref 13.0–17.0)
MCH: 31.1 pg (ref 26.0–34.0)
RBC: 4.11 MIL/uL — ABNORMAL LOW (ref 4.22–5.81)

## 2013-07-12 LAB — TROPONIN I: Troponin I: <0.3 ng/mL (ref <0.30)

## 2013-07-12 MED ORDER — POTASSIUM CHLORIDE CRYS ER 20 MEQ PO TBCR
40.0000 meq | EXTENDED_RELEASE_TABLET | Freq: Once | ORAL | Status: AC
  Administered 2013-07-12: 40 meq via ORAL
  Filled 2013-07-12: qty 2

## 2013-07-12 MED ORDER — GLUCOSE 4 G PO CHEW
1.0000 | CHEWABLE_TABLET | Freq: Once | ORAL | Status: AC
  Administered 2013-07-12: 4 g via ORAL
  Filled 2013-07-12: qty 40

## 2013-07-12 NOTE — Telephone Encounter (Signed)
RN was calling pt and reviewed chart.  Pt in [facility].  Appt cancelled for today.

## 2013-07-12 NOTE — Telephone Encounter (Signed)
Not feeling right-Took his blood pressure it is 198/111 pulse rate was 76 and  The last one was 215/109. Thinks he might need to be seen today.

## 2013-07-12 NOTE — ED Notes (Signed)
Pt has eaten a [place] sandwich, graham crackers, and peanut butter and CBG still 59 mg/dl. PA aware.

## 2013-07-12 NOTE — ED Provider Notes (Signed)
CSN: [id number]     Arrival date & time 07/12/13  1140 History   First MD Initiated Contact with Patient 07/12/13 1150     Chief Complaint  Patient presents with  . Hypertension   (Consider location/radiation/quality/duration/timing/severity/associated sxs/prior Treatment) HPI Comments: Patient is a 67 year old male with a past medical history of diabetes, hypertension, hyperlipidemia, anxiety, GERD, CAD, CHF and pancreatitis who presents to the emergency department with concerns of high blood pressure. Patient states he was at work where there is a health fair, "was not feeling right" and had his blood pressure checked and states the systolic pressure was over 200. He then went to check his sugar, because his blood pressure tends to go high when his sugar is "out of whack". This morning he had some grits for breakfast, checked his blood sugar and the meter read "error". He has been unable to check his blood sugar since. Last night after eating some crackers he states he felt some indigestion and nausea which has not since returned today. After checking his blood pressure at work, he called his cardiologist and was advised to go to the emergency department.  The history is provided by the patient.    Past Medical History  Diagnosis Date  . Diabetes mellitus     Type II  . Hyperlipemia   . Hypertension   . Pancreatitis   . Anxiety   . Hepatic lesion 02/04/11  . GERD (gastroesophageal reflux disease)   . CAD (coronary artery disease)     last cath by [facility] DR.  Mihai Croitoru showing  some  disease involving LCX and small size of Diag   . NSTEMI (non-ST elevated myocardial infarction) 11/21/2009  . Shortness of breath   . CHF exacerbation, due to diastolic dysfunction 07/17/2012  . Ischemic cardiomyopathy     EF 35-40%  . Liver hemangioma    Past Surgical History  Procedure Laterality Date  . Rectal surgery      cyst  . Cardiac catheterization  2011    minimal disease, medical  management  . Coronary angioplasty with stent placement  07/14/2012    successful PCI & stenting of mid LAD & PDA off the dominant CX   Family History  Problem Relation Age of Onset  . Diabetes Paternal Grandmother   . Diabetes Maternal Grandfather   . Colon cancer Neg Hx    History  Substance Use Topics  . Smoking status: Never Smoker   . Smokeless tobacco: Never Used  . Alcohol Use: No    Review of Systems  Constitutional: Positive for diaphoresis. Negative for fever and fatigue.  Respiratory: Negative for shortness of breath.   Cardiovascular: Negative for chest pain.  Gastrointestinal: Positive for nausea. Negative for vomiting.  Neurological: Positive for light-headedness. Negative for dizziness, syncope and weakness.  All other systems reviewed and are negative.    Allergies  Lipitor  Home Medications   Current Outpatient Rx  Name  Route  Sig  Dispense  Refill  . amLODipine (NORVASC) 5 MG tablet   Oral   Take 5 mg by mouth daily.         Marland Kitchen aspirin 81 MG tablet   Oral   Take 81 mg by mouth daily.           . carvedilol (COREG) 12.5 MG tablet   Oral   Take 12.5 mg by mouth 2 (two) times daily.         . furosemide (LASIX) 40 MG tablet  Oral   Take 1 tablet (40 mg total) by mouth daily.   90 tablet   1   . glucose blood (ACCU-CHEK AVIVA PLUS) test strip   Other   1 each by Other route 2 (two) times daily. And lancets 2/day   180 each   12   . glucose blood test strip      Use as instructed   100 each   12   . insulin detemir (LEVEMIR) 100 unit/ml SOLN      Mon-Fri take 40U QAM & 40U QHS; Sat & Sun take 55U QAM & 40U QHS   55 mL   1     Flex pen   . Insulin Pen Needle (PEN NEEDLES) 31G X 6 MM MISC   Does not apply   1 Device by Does not apply route 2 (two) times daily.   60 each   11   . isosorbide mononitrate (IMDUR) 30 MG 24 hr tablet   Oral   Take 1 tablet (30 mg total) by mouth daily.   30 tablet   5   . lisinopril  (PRINIVIL,ZESTRIL) 20 MG tablet   Oral   Take 1 tablet (20 mg total) by mouth 2 (two) times daily.   60 tablet   6   . Multiple Vitamin (MULTIVITAMIN) capsule   Oral   Take 1 capsule by mouth daily.           . nitroGLYCERIN (NITROSTAT) 0.4 MG SL tablet   Sublingual   Place 1 tablet (0.4 mg total) under the tongue every 5 (five) minutes as needed for chest pain.   25 tablet   2   . omeprazole (PRILOSEC) 20 MG capsule   Oral   Take 1 capsule (20 mg total) by mouth 2 (two) times daily.   180 capsule   1     Please d/c rx for omeprazole once daily   . Pancrelipase, Lip-Prot-Amyl, 36000 UNITS CPEP   Oral   Take 2 capsules by mouth See admin instructions. Take 2 capsules by mouth three times daily with meals and 1 capsule with each snack.   720 capsule   0   . potassium chloride (K-DUR) 10 MEQ tablet   Oral   Take 1 tablet (10 mEq total) by mouth daily.   90 tablet   1   . simvastatin (ZOCOR) 40 MG tablet   Oral   Take 1 tablet (40 mg total) by mouth at bedtime.   90 tablet   3   . Ticagrelor (BRILINTA) 90 MG TABS tablet   Oral   Take 1 tablet (90 mg total) by mouth 2 (two) times daily.   60 tablet   10    BP 211/99  Pulse 85  Temp(Src) 98.1 F (36.7 C) (Oral)  Resp 20  SpO2 99% Physical Exam  Nursing note and vitals reviewed. Constitutional: He is oriented to person, place, and time. He appears well-developed and well-nourished. No distress.  HENT:  Head: Normocephalic and atraumatic.  Mouth/Throat: Oropharynx is clear and moist.  Eyes: Conjunctivae and EOM are normal. Pupils are equal, round, and reactive to light.  Neck: Normal range of motion. Neck supple. No JVD present.  Cardiovascular: Normal rate, regular rhythm and normal heart sounds.   No extremity edema.  Pulmonary/Chest: Effort normal and breath sounds normal.  Abdominal: Soft. Bowel sounds are normal. He exhibits no distension. There is no tenderness.  Musculoskeletal: Normal range of  motion. He exhibits no edema.  Neurological: He is alert and oriented to person, place, and time. He has normal strength. No sensory deficit.  Skin: Skin is warm and dry. He is not diaphoretic.  Psychiatric: He has a normal mood and affect. His behavior is normal.    ED Course  Procedures (including critical care time) Labs Review Labs Reviewed  GLUCOSE, CAPILLARY - Abnormal; Notable for the following:    Glucose-Capillary 64 (*)    All other components within normal limits  CBC - Abnormal; Notable for the following:    RBC 4.11 (*)    Hemoglobin 12.8 (*)    HCT 36.1 (*)    All other components within normal limits  COMPREHENSIVE METABOLIC PANEL - Abnormal; Notable for the following:    Potassium 3.3 (*)    Glucose, Bld 67 (*)    AST 43 (*)    All other components within normal limits  LIPASE, BLOOD - Abnormal; Notable for the following:    Lipase 7 (*)    All other components within normal limits  GLUCOSE, CAPILLARY - Abnormal; Notable for the following:    Glucose-Capillary 67 (*)    All other components within normal limits  GLUCOSE, CAPILLARY - Abnormal; Notable for the following:    Glucose-Capillary 63 (*)    All other components within normal limits  GLUCOSE, CAPILLARY - Abnormal; Notable for the following:    Glucose-Capillary 59 (*)    All other components within normal limits  GLUCOSE, CAPILLARY - Abnormal; Notable for the following:    Glucose-Capillary 112 (*)    All other components within normal limits  TROPONIN I   Imaging Review Dg Chest 2 View  07/12/2013   CLINICAL DATA:  Hypertension, hypoglycemia  EXAM: CHEST  2 VIEW  COMPARISON:  Prior chest x-ray 07/17/2012  FINDINGS: The lungs are clear and negative for focal airspace consolidation, pulmonary edema or suspicious pulmonary nodule. No pleural effusion or pneumothorax. Cardiac and mediastinal contours are within normal limits. No acute fracture or lytic or blastic osseous lesions. The visualized upper  abdominal bowel gas pattern is unremarkable.  IMPRESSION: No active cardiopulmonary disease.   Electronically Signed   By: Malachy Moan M.D.   On: 07/12/2013 13:32    EKG Interpretation   None      Date: 07/12/2013  Rate: 70  Rhythm: normal sinus rhythm  QRS Axis: left  Intervals: normal  ST/T Wave abnormalities: nonspecific ST changes  Conduction Disutrbances:none  Narrative Interpretation: no stemi, no sig changes since EKG obtained on 02/12/2013  Old EKG Reviewed: unchanged      MDM   1. Hypertension   2. Hypoglycemia     Patient with hypertension, hypoglycemia. CBG 64 on arrival, he will receive orange juice and glucose. He is alert and oriented, speaking in full sentences. Blood pressure initially noted to be 211/99, 158/90 on my examination. Given patient's significant cardiac history, will rule out any cardiac issues. Also considering indigestion and nausea last night with history of pancreatitis, will check liver functions and lipase. EKG without any changes. Chest x-ray pending. 2:46 PM CBG increased to 112 after sandwich, juice and oral glucose. He is in NAD. Potassium replaced orally. Labs otherwise WNL. CXR clear. BP 153/82. Possibility he may have given himself too much insulin. He will be discharged with PCP follow up. Will also f/u with cardiology. Return precautions discussed. Patient states understanding of treatment care plan and is agreeable. Case discussed with attending Dr. Oletta Lamas who agrees with plan of care.  Trevor Mace, PA-C 07/12/13 [phone]

## 2013-07-12 NOTE — ED Notes (Signed)
CBG is 64. Notified Nurse [place].

## 2013-07-12 NOTE — ED Notes (Signed)
Pt states that his CBG meter at home read "error" and that his BP is "up when sugar is out of whack"

## 2013-07-12 NOTE — ED Notes (Signed)
Pt given orange juice for CBG because the pyxis are not filled with the glucose tablets. Pharmacy called and stated they would send the tablets.

## 2013-07-12 NOTE — ED Notes (Signed)
PA aware of pts CBG and stated that it would be ok to give him food. Pt given a happy meal and will reassess.

## 2013-07-12 NOTE — Telephone Encounter (Signed)
Returned call and pt verified x 2.  Pt stated he works for [employer] and they had a health fair today.  Stated he had his BP checked and it was 198/111 HR 76.  Stated he hasn't been feeling well today.  Pt c/o feeling tingly and jittery.  Denied CP, SOB, HA or blurred vision.  Pt also stated he is a diabetic and when his sugar is high it causes his BP to go up.  Pt stated he wasn't able to get a reading on his sugar today b/c his machine kept reading "error."  RN asked pt if he checked the control on his strips and denied.  Stated he doesn't carry it with the machine.  RN also asked pt the health fair has a station to check his sugar as his physical symptoms sound like they are more r/t an elevated blood sugar than elevated BP.  Pt stated he will check, but wanted to be seen for his BP b/c he doesn't want to have a stroke.  Pt informed RN can schedule him for appt today, but it may be more appropriate for him to have his blood sugar evaluated, which we are not able to do in the office.  Pt verbalized understanding and stated he will call his sugar doctor to see if he wants to see him today.  Pt wanted to keep appt today at 2pm w/ NP for evaluation of BP and will call back if he is able to have sugar checked and his sugar doctor.    Of note, before call was ended, pt c/o feeling clammy and sweating and was advised ER.  Pt stated he wanted to call his sugar doctor first and will call back.

## 2013-07-12 NOTE — ED Notes (Signed)
Pt checked his BP this am and it was elevated.  "the top number was near 200".  Pt does not have CP or sob.  Pt states that he has been feeling "jittery and clammy"

## 2013-07-12 NOTE — ED Provider Notes (Signed)
Medical screening examination/treatment/procedure(s) were performed by non-physician practitioner and as supervising physician I was immediately available for consultation/collaboration.   Gavin Pound. Laiyla Slagel, MD 07/12/13 1455

## 2013-07-13 ENCOUNTER — Ambulatory Visit (INDEPENDENT_AMBULATORY_CARE_PROVIDER_SITE_OTHER): Payer: 59 | Admitting: Physician Assistant

## 2013-07-13 ENCOUNTER — Encounter: Payer: Self-pay | Admitting: Physician Assistant

## 2013-07-13 ENCOUNTER — Telehealth: Payer: Self-pay | Admitting: Cardiovascular Disease

## 2013-07-13 VITALS — BP 170/98 | HR 85 | Ht 72.0 in | Wt 195.0 lb

## 2013-07-13 DIAGNOSIS — I2510 Atherosclerotic heart disease of native coronary artery without angina pectoris: Secondary | ICD-10-CM

## 2013-07-13 DIAGNOSIS — I10 Essential (primary) hypertension: Secondary | ICD-10-CM

## 2013-07-13 MED ORDER — CARVEDILOL 12.5 MG PO TABS: 18.7500 mg | ORAL_TABLET | Freq: Two times a day (BID) | ORAL | Status: DC

## 2013-07-13 NOTE — Patient Instructions (Signed)
Increase Coreg to 18.75mg  twice daily.  This will be 1.5 tablets twice a day.  You can use up your current supply.  Followup with Dr. Salena Saner in two weeks.

## 2013-07-13 NOTE — Progress Notes (Signed)
Date:  07/13/2013   ID:  David Lane, DOB 21-Nov-1945, MRN [id number]  PCP:  Evette Georges, MD  Primary Cardiologist:  Croitoru    History of Present Illness: David Lane is a 67 y.o. male appropriately-weighted Caucasian male with a history of coronary artery disease, status post drug-eluting stent to the LAD/PDA on July 14, 2012, as well as ischemic cardiomyopathy with an ejection fraction of 35% to 40% by echocardiogram July 14, 2012, as well as hyperlipidemia, hypertension, anxiety, and diabetes mellitus.  He was seen yesterday in the ER because his BP was elevated over 200.    Patient reports that yesterday while he was at work he was feeling a little clammy and they're having a health fair so he had his blood pressure checked there was 190/111. With and rechecked it a few minutes later was 222/108 heart rate of 77. He subsequently went to the emergency room for evaluation. They found that his potassium was low and given supplementation. Otherwise it gave him no treatment his blood pressure which decreased to 158 systolic.  He did have a mildly low glucose level and was given some orange juice.  He reports going out to dinner last night and then having some diarrhea through the night. Today's blood pressure was 187/101 at home.  It is still elevated in the 170s in the office.  He also reports some intermittent nausea after eating some grits or a banana.  He currently denies nausea, vomiting, fever, chest pain, shortness of breath, orthopnea, dizziness, PND, cough, congestion, abdominal pain, hematochezia, melena, lower extremity edema, claudication.  Wt Readings from Last 3 Encounters:  07/13/13 195 lb (88.451 kg)  04/13/13 193 lb (87.544 kg)  03/20/13 189 lb 6 oz (85.9 kg)     Past Medical History  Diagnosis Date  . Diabetes mellitus     Type II  . Hyperlipemia   . Hypertension   . Pancreatitis   . Anxiety   . Hepatic lesion 02/04/11  . GERD (gastroesophageal  reflux disease)   . CAD (coronary artery disease)     last cath by [facility] DR.  Mihai Croitoru showing  some  disease involving LCX and small size of Diag   . NSTEMI (non-ST elevated myocardial infarction) 11/21/2009  . Shortness of breath   . CHF exacerbation, due to diastolic dysfunction 07/17/2012  . Ischemic cardiomyopathy     EF 35-40%  . Liver hemangioma     Current Outpatient Prescriptions  Medication Sig Dispense Refill  . amLODipine (NORVASC) 5 MG tablet Take 5 mg by mouth at bedtime.       Marland Kitchen aspirin 81 MG tablet Take 81 mg by mouth daily.        . carvedilol (COREG) 12.5 MG tablet Take 1.5 tablets (18.75 mg total) by mouth 2 (two) times daily.  90 tablet  5  . furosemide (LASIX) 40 MG tablet Take 1 tablet (40 mg total) by mouth daily.  90 tablet  1  . glucose blood (ACCU-CHEK AVIVA PLUS) test strip 1 each by Other route 2 (two) times daily. And lancets 2/day  180 each  12  . glucose blood test strip Use as instructed  100 each  12  . insulin detemir (LEVEMIR) 100 UNIT/ML injection Inject 40-50 Units into the skin daily. 40 units during the week while working.  50 on Saturday and Sunday when not working.      . Insulin Pen Needle (PEN NEEDLES) 31G X 6 MM MISC  1 Device by Does not apply route 2 (two) times daily.  60 each  11  . isosorbide mononitrate (IMDUR) 30 MG 24 hr tablet Take 1 tablet (30 mg total) by mouth daily.  30 tablet  5  . lisinopril (PRINIVIL,ZESTRIL) 20 MG tablet Take 1 tablet (20 mg total) by mouth 2 (two) times daily.  60 tablet  6  . Multiple Vitamin (MULTIVITAMIN) capsule Take 1 capsule by mouth daily.        . nitroGLYCERIN (NITROSTAT) 0.4 MG SL tablet Place 1 tablet (0.4 mg total) under the tongue every 5 (five) minutes as needed for chest pain.  25 tablet  2  . omeprazole (PRILOSEC) 20 MG capsule Take 1 capsule (20 mg total) by mouth 2 (two) times daily.  180 capsule  1  . Pancrelipase, Lip-Prot-Amyl, 36000 UNITS CPEP Take 2 capsules by mouth See admin  instructions. Take 2 capsules by mouth three times daily with meals and 1 capsule with each snack.  720 capsule  0  . potassium chloride (K-DUR) 10 MEQ tablet Take 1 tablet (10 mEq total) by mouth daily.  90 tablet  1  . simvastatin (ZOCOR) 40 MG tablet Take 1 tablet (40 mg total) by mouth at bedtime.  90 tablet  3  . Ticagrelor (BRILINTA) 90 MG TABS tablet Take 1 tablet (90 mg total) by mouth 2 (two) times daily.  60 tablet  10   No current facility-administered medications for this visit.    Allergies:    Allergies  Allergen Reactions  . Lipitor [Atorvastatin]     Muscle weakness    Social History:  The patient  reports that he has never smoked. He has never used smokeless tobacco. He reports that he does not drink alcohol or use illicit drugs.   Family history:   Family History  Problem Relation Age of Onset  . Diabetes Paternal Grandmother   . Diabetes Maternal Grandfather   . Colon cancer Neg Hx     ROS:  Please see the history of present illness.  All other systems reviewed and negative.   PHYSICAL EXAM: VS:  BP 170/98  Pulse 85  Ht 6' (1.829 m)  Wt 195 lb (88.451 kg)  BMI 26.44 kg/m2 Well nourished, well developed, in no acute distress HEENT: Pupils are equal round react to light accommodation extraocular movements are intact.  Neck: no JVDNo cervical lymphadenopathy. Cardiac: Regular rate and rhythm without murmurs rubs or gallops. Lungs:  clear to auscultation bilaterally, no wheezing, rhonchi or rales Abd: soft, nontender, positive bowel sounds all quadrants, no hepatosplenomegaly Ext: no lower extremity edema.  2+ radial and dorsalis pedis pulses. Skin: warm and dry Neuro:  Grossly normal  EKG:   Sinus rhythm. Septal Q waves. Unchanged from prior.  heart rate 85 beats per minute  ASSESSMENT AND PLAN:  Problem List Items Addressed This Visit   HYPERTENSION     The blood pressure still elevated in the 170s. We'll increase his Coreg to 18.75 mg twice daily and  to have him see Dr. Salena Saner. back in 2 weeks.  He also has questions regarding stopping probably do this then one year since stent placement.  Although given his recent elevated blood pressures it might be prudent to continue the Bilinta for the time being.    Relevant Medications      carvedilol (COREG) tablet   CAD, 07/14/12- LAD/PDA DES - Primary   Relevant Medications      carvedilol (COREG) tablet   Other  Relevant Orders      EKG 12-Lead

## 2013-07-13 NOTE — Telephone Encounter (Signed)
Went to ER with High BP and low Sugar  Was advised to Call Dr C office if have problems this am  Says not feeling well BP 187/101  Feels flushed

## 2013-07-13 NOTE — Assessment & Plan Note (Signed)
The blood pressure still elevated in the 170s. We'll increase his Coreg to 18.75 mg twice daily and to have him see Dr. Salena Saner. back in 2 weeks.  He also has questions regarding stopping probably do this then one year since stent placement.  Although given his recent elevated blood pressures it might be prudent to continue the Bilinta for the time being.

## 2013-07-13 NOTE — Telephone Encounter (Signed)
Called cell no answer ,called home #. Spoke to patient concerned that his b/p is still elevated from yesterday . Today's b/p at 5:30 am was 187/101,per patient ;last night at 8 pm was 187/88  Appointment schedule for 11 am today with [facility]. Patient is aware.

## 2013-07-16 ENCOUNTER — Encounter: Payer: Self-pay | Admitting: Physician Assistant

## 2013-07-17 ENCOUNTER — Encounter (HOSPITAL_COMMUNITY): Payer: Self-pay | Admitting: Emergency Medicine

## 2013-07-17 ENCOUNTER — Emergency Department (HOSPITAL_COMMUNITY)
Admission: EM | Admit: 2013-07-17 | Discharge: 2013-07-17 | Disposition: A | Discharge status: Home / Self Care | Payer: 59 | Attending: Emergency Medicine | Admitting: Emergency Medicine

## 2013-07-17 ENCOUNTER — Telehealth: Payer: Self-pay | Admitting: *Deleted

## 2013-07-17 DIAGNOSIS — F411 Generalized anxiety disorder: Secondary | ICD-10-CM | POA: Insufficient documentation

## 2013-07-17 DIAGNOSIS — I2510 Atherosclerotic heart disease of native coronary artery without angina pectoris: Secondary | ICD-10-CM | POA: Insufficient documentation

## 2013-07-17 DIAGNOSIS — I10 Essential (primary) hypertension: Secondary | ICD-10-CM

## 2013-07-17 DIAGNOSIS — R079 Chest pain, unspecified: Secondary | ICD-10-CM

## 2013-07-17 DIAGNOSIS — I509 Heart failure, unspecified: Secondary | ICD-10-CM

## 2013-07-17 DIAGNOSIS — I5030 Unspecified diastolic (congestive) heart failure: Secondary | ICD-10-CM | POA: Insufficient documentation

## 2013-07-17 DIAGNOSIS — K219 Gastro-esophageal reflux disease without esophagitis: Secondary | ICD-10-CM | POA: Insufficient documentation

## 2013-07-17 DIAGNOSIS — R0682 Tachypnea, not elsewhere classified: Secondary | ICD-10-CM | POA: Insufficient documentation

## 2013-07-17 DIAGNOSIS — E785 Hyperlipidemia, unspecified: Secondary | ICD-10-CM | POA: Insufficient documentation

## 2013-07-17 DIAGNOSIS — Z79899 Other long term (current) drug therapy: Secondary | ICD-10-CM | POA: Insufficient documentation

## 2013-07-17 DIAGNOSIS — R5381 Other malaise: Secondary | ICD-10-CM | POA: Insufficient documentation

## 2013-07-17 DIAGNOSIS — I252 Old myocardial infarction: Secondary | ICD-10-CM | POA: Insufficient documentation

## 2013-07-17 DIAGNOSIS — Z9889 Other specified postprocedural states: Secondary | ICD-10-CM | POA: Insufficient documentation

## 2013-07-17 DIAGNOSIS — E876 Hypokalemia: Secondary | ICD-10-CM

## 2013-07-17 DIAGNOSIS — Z7982 Long term (current) use of aspirin: Secondary | ICD-10-CM | POA: Insufficient documentation

## 2013-07-17 DIAGNOSIS — E119 Type 2 diabetes mellitus without complications: Secondary | ICD-10-CM | POA: Insufficient documentation

## 2013-07-17 DIAGNOSIS — Z794 Long term (current) use of insulin: Secondary | ICD-10-CM | POA: Insufficient documentation

## 2013-07-17 DIAGNOSIS — Z9861 Coronary angioplasty status: Secondary | ICD-10-CM | POA: Insufficient documentation

## 2013-07-17 LAB — CBC WITH DIFFERENTIAL/PLATELET
Basophils Absolute: 0 10*3/uL (ref 0.0–0.1)
Basophils Relative: 0 % (ref 0–1)
HCT: 36.1 % — ABNORMAL LOW (ref 39.0–52.0)
Hemoglobin: 12.8 g/dL — ABNORMAL LOW (ref 13.0–17.0)
Lymphocytes Relative: 31 % (ref 12–46)
Lymphs Abs: 1.6 10*3/uL (ref 0.7–4.0)
MCH: 31.2 pg (ref 26.0–34.0)
MCHC: 35.5 g/dL (ref 30.0–36.0)
MCV: 88 fL (ref 78.0–100.0)
Monocytes Absolute: 0.6 10*3/uL (ref 0.1–1.0)
Monocytes Relative: 11 % (ref 3–12)
Neutro Abs: 2.9 10*3/uL (ref 1.7–7.7)
Neutrophils Relative %: 55 % (ref 43–77)
Platelets: 184 10*3/uL (ref 150–400)
RBC: 4.1 MIL/uL — ABNORMAL LOW (ref 4.22–5.81)
WBC: 5.3 10*3/uL (ref 4.0–10.5)

## 2013-07-17 LAB — POCT I-STAT, CHEM 8
BUN: 14 mg/dL (ref 6–23)
Calcium, Ion: 1.21 mmol/L (ref 1.13–1.30)
Chloride: 101 mEq/L (ref 96–112)
Creatinine, Ser: 0.9 mg/dL (ref 0.50–1.35)
Glucose, Bld: 122 mg/dL — ABNORMAL HIGH (ref 70–99)
HCT: 38 % — ABNORMAL LOW (ref 39.0–52.0)
Potassium: 3.1 mEq/L — ABNORMAL LOW (ref 3.5–5.1)

## 2013-07-17 LAB — GLUCOSE, CAPILLARY: Glucose-Capillary: 139 mg/dL — ABNORMAL HIGH (ref 70–99)

## 2013-07-17 LAB — POCT I-STAT TROPONIN I

## 2013-07-17 MED ORDER — SPIRONOLACTONE 25 MG PO TABS
25.0000 mg | ORAL_TABLET | Freq: Every day | ORAL | Status: DC
  Administered 2013-07-17: 25 mg via ORAL
  Filled 2013-07-17: qty 1

## 2013-07-17 MED ORDER — SPIRONOLACTONE 25 MG PO TABS: 25.0000 mg | ORAL_TABLET | Freq: Every day | ORAL | Status: DC

## 2013-07-17 NOTE — ED Notes (Signed)
Patient states that he was recently seen in ED and f/u with cardiology per DC instructions Patient states that BP meds were changed and per patient "My blood pressure is still high." Patient concerned because "My numbers are like strokin' numbers." Patient also with c/o red face, pressure in face, red eyes, bilateral shoulder joint pain Patient alert and oriented, tongue midline, PERRLA, grips equal bilaterally--Neuro intact

## 2013-07-17 NOTE — ED Provider Notes (Addendum)
CSN: [id number]     Arrival date & time 07/17/13  4098 History   First MD Initiated Contact with Patient 07/17/13 0703     Chief Complaint  Patient presents with  . Hypertension   (Consider location/radiation/quality/duration/timing/severity/associated sxs/prior Treatment) HPI Comments: Saw [facility] on Friday and at that time they increased his coreg which he has now been taking for 5 days and has not noticed improvement of his BP.  Patient is a 67 y.o. male presenting with hypertension. The history is provided by the patient.  Hypertension This is a recurrent problem. Episode onset: 2 weeks. The problem occurs daily. The problem has not changed since onset.Pertinent negatives include no chest pain, no abdominal pain, no headaches and no shortness of breath. Associated symptoms comments: States that he wakes up in the morning and face is flushed and some mornings he will get a tightness in his shoulders. Nothing (does notice occassionally with exertion he becomes very fatigued) aggravates the symptoms. Nothing relieves the symptoms. The treatment provided no relief.    Past Medical History  Diagnosis Date  . Diabetes mellitus     Type II  . Hyperlipemia   . Hypertension   . Pancreatitis   . Anxiety   . Hepatic lesion 02/04/11  . GERD (gastroesophageal reflux disease)   . CAD (coronary artery disease)     last cath by [facility] DR.  Mihai Croitoru showing  some  disease involving LCX and small size of Diag   . NSTEMI (non-ST elevated myocardial infarction) 11/21/2009  . Shortness of breath   . CHF exacerbation, due to diastolic dysfunction 07/17/2012  . Ischemic cardiomyopathy     EF 35-40%  . Liver hemangioma    Past Surgical History  Procedure Laterality Date  . Rectal surgery      cyst  . Cardiac catheterization  2011    minimal disease, medical management  . Coronary angioplasty with stent placement  07/14/2012    successful PCI & stenting of mid LAD & PDA off the dominant CX    Family History  Problem Relation Age of Onset  . Diabetes Paternal Grandmother   . Diabetes Maternal Grandfather   . Colon cancer Neg Hx    History  Substance Use Topics  . Smoking status: Never Smoker   . Smokeless tobacco: Never Used  . Alcohol Use: No    Review of Systems  Constitutional: Positive for fatigue. Negative for fever, chills, diaphoresis and appetite change.  Respiratory: Negative for cough and shortness of breath.   Cardiovascular: Negative for chest pain.  Gastrointestinal: Negative for abdominal pain.  Neurological: Negative for headaches.  All other systems reviewed and are negative.    Allergies  Lipitor  Home Medications   Current Outpatient Rx  Name  Route  Sig  Dispense  Refill  . amLODipine (NORVASC) 5 MG tablet   Oral   Take 5 mg by mouth at bedtime.          Marland Kitchen aspirin 81 MG tablet   Oral   Take 81 mg by mouth daily.           . carvedilol (COREG) 12.5 MG tablet   Oral   Take 1.5 tablets (18.75 mg total) by mouth 2 (two) times daily.   90 tablet   5   . furosemide (LASIX) 40 MG tablet   Oral   Take 1 tablet (40 mg total) by mouth daily.   90 tablet   1   . glucose blood (  ACCU-CHEK AVIVA PLUS) test strip   Other   1 each by Other route 2 (two) times daily. And lancets 2/day   180 each   12   . glucose blood test strip      Use as instructed   100 each   12   . insulin detemir (LEVEMIR) 100 UNIT/ML injection   Subcutaneous   Inject 40-50 Units into the skin daily. 40 units during the week while working.  50 on Saturday and Sunday when not working.         . Insulin Pen Needle (PEN NEEDLES) 31G X 6 MM MISC   Does not apply   1 Device by Does not apply route 2 (two) times daily.   60 each   11   . isosorbide mononitrate (IMDUR) 30 MG 24 hr tablet   Oral   Take 1 tablet (30 mg total) by mouth daily.   30 tablet   5   . lisinopril (PRINIVIL,ZESTRIL) 20 MG tablet   Oral   Take 1 tablet (20 mg total) by mouth 2  (two) times daily.   60 tablet   6   . Multiple Vitamin (MULTIVITAMIN) capsule   Oral   Take 1 capsule by mouth daily.           . nitroGLYCERIN (NITROSTAT) 0.4 MG SL tablet   Sublingual   Place 1 tablet (0.4 mg total) under the tongue every 5 (five) minutes as needed for chest pain.   25 tablet   2   . omeprazole (PRILOSEC) 20 MG capsule   Oral   Take 1 capsule (20 mg total) by mouth 2 (two) times daily.   180 capsule   1     Please d/c rx for omeprazole once daily   . Pancrelipase, Lip-Prot-Amyl, 36000 UNITS CPEP   Oral   Take 2 capsules by mouth See admin instructions. Take 2 capsules by mouth three times daily with meals and 1 capsule with each snack.   720 capsule   0   . potassium chloride (K-DUR) 10 MEQ tablet   Oral   Take 1 tablet (10 mEq total) by mouth daily.   90 tablet   1   . simvastatin (ZOCOR) 40 MG tablet   Oral   Take 1 tablet (40 mg total) by mouth at bedtime.   90 tablet   3   . Ticagrelor (BRILINTA) 90 MG TABS tablet   Oral   Take 1 tablet (90 mg total) by mouth 2 (two) times daily.   60 tablet   10    BP 184/93  Pulse 81  Temp(Src) 98.7 F (37.1 C) (Oral)  Resp 12  Ht 6' (1.829 m)  Wt 180 lb (81.647 kg)  BMI 24.41 kg/m2  SpO2 99% Physical Exam  Constitutional: He is oriented to person, place, and time. He appears well-developed and well-nourished. No distress.  HENT:  Head: Normocephalic and atraumatic.  Right Ear: External ear normal.  Left Ear: External ear normal.  Mouth/Throat: Oropharynx is clear and moist.  Eyes: Conjunctivae and EOM are normal. Pupils are equal, round, and reactive to light. Right eye exhibits no discharge.  Neck: Normal range of motion. Neck supple.  Cardiovascular: Normal rate, regular rhythm, normal heart sounds and intact distal pulses.   No murmur heard. Pulmonary/Chest: Tachypnea noted. No respiratory distress. He has no decreased breath sounds. He has no wheezes. He has no rhonchi. He has no  rales.  Abdominal: Soft. There is no  tenderness.  Musculoskeletal: Normal range of motion. He exhibits no edema and no tenderness.  Neurological: He is alert and oriented to person, place, and time.  Skin: Skin is warm and dry. No rash noted.  Psychiatric: His mood appears anxious.    ED Course  Procedures (including critical care time) Labs Review Labs Reviewed  GLUCOSE, CAPILLARY - Abnormal; Notable for the following:    Glucose-Capillary 139 (*)    All other components within normal limits  CBC WITH DIFFERENTIAL - Abnormal; Notable for the following:    RBC 4.10 (*)    Hemoglobin 12.8 (*)    HCT 36.1 (*)    All other components within normal limits  POCT I-STAT, CHEM 8 - Abnormal; Notable for the following:    Potassium 3.1 (*)    Glucose, Bld 122 (*)    Hemoglobin 12.9 (*)    HCT 38.0 (*)    All other components within normal limits  POCT I-STAT TROPONIN I   Imaging Review No results found.  EKG Interpretation     Ventricular Rate:  78 PR Interval:  199 QRS Duration: 94 QT Interval:  384 QTC Calculation: 438 R Axis:   9 Text Interpretation:  Sinus rhythm Probable left atrial enlargement No significant change since last tracing            MDM   1. Hypertension   2. Hypokalemia     Patient is here because of persistent high blood pressure despite medication change approximately 5 days ago. He is complaining of feeling flushed in his face, tightness in bilateral shoulders intermittently that comes and goes. He notices that his blood pressures the highest in the morning but he takes his medications at the exact same time every day. Patient recently [facility] last Friday and had his Coreg increased to 18.75 twice a day. He has not noticed any change in his blood pressure. He states he's been on a strict diet and avoid high salt foods. His blood sugar has been controlled. Patient is frustrated and upset and does not know why his blood  pressure has suddenly increased in the last 2 weeks.  He denies any frank chest pain or shortness of breath but states he is noticing now after exertion he becomes very fatigued and has some tightness in his arms but denies shortness of breath.  Concern for possible new cardiac etiology as he does have a history of cardiomyopathy and stent placement versus possible radial artery stenosis as his cause of elevated blood pressure.  EKG, CBC, i-STAT, troponin pending. Patient was here last Thursday for similar symptoms and at that time had a negative workup in the ER except for mild hypokalemia.  8:52 AM Other than mild hypokalemia labs are wnl and blood sugar today is wnl.  Spoke with pt's physician Dr. Royann Shivers who recommends starting spironolactone 25mg  daily and they will see him on Friday but in the meantime will get an echo.  Gwyneth Sprout, MD 07/17/13 8295  Gwyneth Sprout, MD 07/17/13 [phone]

## 2013-07-17 NOTE — ED Notes (Signed)
Patient also concerned that "my stents are blocked up." Patient denies c/o CP, SOB, DOE Patient states that he recently went to a health fair and now he is concerned that he might have a stroke, that his "stents are blocked" Patient noted to be anxious--reassurance given

## 2013-07-17 NOTE — Telephone Encounter (Signed)
Patient in ER now w/chest pain & CHF.  Dr. Salena Saner spoke w/Dr. Anitra Lauth.  Will get an Echo before Friday and schedule an appt to see Dr. Salena Saner on Friday.

## 2013-07-17 NOTE — ED Notes (Signed)
Pt states he has high blood pressure and his medicine was changed last week   Pt was seen here last week for same  Pt's face is flushed and his B/P in triage 201/111  Pt states he has pressure in his cheeks and in his forehead and states his mouth feels dry

## 2013-07-20 ENCOUNTER — Telehealth (HOSPITAL_COMMUNITY): Payer: Self-pay | Admitting: *Deleted

## 2013-07-20 ENCOUNTER — Ambulatory Visit (INDEPENDENT_AMBULATORY_CARE_PROVIDER_SITE_OTHER): Payer: 59 | Admitting: Cardiovascular Disease

## 2013-07-20 ENCOUNTER — Encounter: Payer: Self-pay | Admitting: Cardiovascular Disease

## 2013-07-20 VITALS — BP 170/92 | HR 64 | Ht 72.0 in | Wt 190.0 lb

## 2013-07-20 DIAGNOSIS — I509 Heart failure, unspecified: Secondary | ICD-10-CM

## 2013-07-20 DIAGNOSIS — Z79899 Other long term (current) drug therapy: Secondary | ICD-10-CM

## 2013-07-20 DIAGNOSIS — I5042 Chronic combined systolic (congestive) and diastolic (congestive) heart failure: Secondary | ICD-10-CM

## 2013-07-20 DIAGNOSIS — I701 Atherosclerosis of renal artery: Secondary | ICD-10-CM

## 2013-07-20 DIAGNOSIS — I10 Essential (primary) hypertension: Secondary | ICD-10-CM

## 2013-07-20 DIAGNOSIS — I2510 Atherosclerotic heart disease of native coronary artery without angina pectoris: Secondary | ICD-10-CM

## 2013-07-20 MED ORDER — CARVEDILOL 25 MG PO TABS: 25.0000 mg | ORAL_TABLET | Freq: Two times a day (BID) | ORAL | Status: DC

## 2013-07-20 NOTE — Patient Instructions (Signed)
Your physician has recommended you make the following change in your medication: INCREASE carvedilol to 25mg  twice daily.  STOP Brilinta.  Your physician recommends that you schedule a follow-up appointment in: 2 weeks. Please have blood work done a day or two before your office visit.

## 2013-07-20 NOTE — Progress Notes (Signed)
Patient ID: David Lane, male   DOB: 09-May-1946, 67 y.o.   MRN: [id number] In the emergency room with elevated blood pressure. Spinal lactone added after my discussion with Dr. Anitra Lauth just 3 days ago. Blood pressure still high. Increase carvedilol to 25 mg twice a day. Come back in a couple of weeks. Check for renal artery stenosis     Reason for office visit Hypertension  David Lane continues to be plagued by recurrent episodes of very high blood pressure. Unfortunately he gets very anxious when this happens and this leads to a spiral of ever worsening hypertension. He was recently seen in the emergency room. I discussed his care with Dr. Anitra Lauth at that time. We decided to add Spironolactone. It is probably too soon to see the full effect of this medication but his blood pressure is still high.  He has a history of coronary disease and received a stent to the LAD artery and a stent to the posterior descending artery in 2013. He has moderately depressed left ventricular systolic function with an ejection fraction of 35-40% and has previously presented with pulmonary edema secondary to heart failure.  Today he feels well and has no complaints other than persistent worrying about his blood pressure readings.   Allergies  Allergen Reactions  . Lipitor [Atorvastatin]     Muscle weakness    Current Outpatient Prescriptions  Medication Sig Dispense Refill  . amLODipine (NORVASC) 5 MG tablet Take 5 mg by mouth at bedtime.       Marland Kitchen aspirin 81 MG tablet Take 81 mg by mouth daily.        . furosemide (LASIX) 40 MG tablet Take 1 tablet (40 mg total) by mouth daily.  90 tablet  1  . glucose blood (ACCU-CHEK AVIVA PLUS) test strip 1 each by Other route 2 (two) times daily. And lancets 2/day  180 each  12  . glucose blood test strip Use as instructed  100 each  12  . insulin detemir (LEVEMIR) 100 UNIT/ML injection Inject 40-50 Units into the skin 2 (two) times daily. 40 units during the week  while working bid.  50 on Saturday and Sunday when not working 50Un in AM, 40U in PM.      . Insulin Pen Needle (PEN NEEDLES) 31G X 6 MM MISC 1 Device by Does not apply route 2 (two) times daily.  60 each  11  . isosorbide mononitrate (IMDUR) 30 MG 24 hr tablet Take 1 tablet (30 mg total) by mouth daily.  30 tablet  5  . lisinopril (PRINIVIL,ZESTRIL) 40 MG tablet Take 40 mg by mouth daily.      . Multiple Vitamin (MULTIVITAMIN) capsule Take 1 capsule by mouth daily.        . nitroGLYCERIN (NITROSTAT) 0.4 MG SL tablet Place 1 tablet (0.4 mg total) under the tongue every 5 (five) minutes as needed for chest pain.  25 tablet  2  . omeprazole (PRILOSEC) 20 MG capsule Take 1 capsule (20 mg total) by mouth 2 (two) times daily.  180 capsule  1  . Pancrelipase, Lip-Prot-Amyl, 36000 UNITS CPEP Take 2 capsules by mouth See admin instructions. Take 2 capsules by mouth three times daily with meals and 1 capsule with each snack.  720 capsule  0  . potassium chloride (K-DUR) 10 MEQ tablet Take 1 tablet (10 mEq total) by mouth daily.  90 tablet  1  . simvastatin (ZOCOR) 40 MG tablet Take 1 tablet (40 mg total) by  mouth at bedtime.  90 tablet  3  . spironolactone (ALDACTONE) 25 MG tablet Take 1 tablet (25 mg total) by mouth daily.  30 tablet  0  . BRILINTA 90 MG TABS tablet Take 90 mg by mouth once.       . carvedilol (COREG) 25 MG tablet Take 18.75-25 mg by mouth 2 (two) times daily. 18.75mg  AM 25mg  PM       No current facility-administered medications for this visit.    Past Medical History  Diagnosis Date  . Diabetes mellitus     Type II  . Hyperlipemia   . Hypertension   . Pancreatitis   . Anxiety   . Hepatic lesion 02/04/11  . GERD (gastroesophageal reflux disease)   . CAD (coronary artery disease)     last cath by [facility] DR.  Rockland Kotarski showing  some  disease involving LCX and small size of Diag   . NSTEMI (non-ST elevated myocardial infarction) 11/21/2009  . Shortness of breath   . CHF  exacerbation, due to diastolic dysfunction 07/17/2012  . Ischemic cardiomyopathy     EF 35-40%  . Liver hemangioma     Past Surgical History  Procedure Laterality Date  . Rectal surgery      cyst  . Cardiac catheterization  2011    minimal disease, medical management  . Coronary angioplasty with stent placement  07/14/2012    successful PCI & stenting of mid LAD & PDA off the dominant CX    Family History  Problem Relation Age of Onset  . Diabetes Paternal Grandmother   . Diabetes Maternal Grandfather   . Colon cancer Neg Hx     History   Social History  . Marital Status: Single    Spouse Name: N/A    Number of Children: 0  . Years of Education: N/A   Occupational History  . [employer]   .  [employer]   Social History Main Topics  . Smoking status: Never Smoker   . Smokeless tobacco: Never Used  . Alcohol Use: No  . Drug Use: No  . Sexual Activity: No   Other Topics Concern  . Not on file   Social History Narrative  . No narrative on file    Review of systems: The patient specifically denies any chest pain at rest or with exertion, dyspnea at rest or with exertion, orthopnea, paroxysmal nocturnal dyspnea, syncope, palpitations, focal neurological deficits, intermittent claudication, lower extremity edema, unexplained weight gain, cough, hemoptysis or wheezing.  The patient also denies abdominal pain, nausea, vomiting, dysphagia, diarrhea, constipation, polyuria, polydipsia, dysuria, hematuria, frequency, urgency, abnormal bleeding or bruising, fever, chills, unexpected weight changes, mood swings, change in skin or hair texture, change in voice quality, auditory or visual problems, allergic reactions or rashes, new musculoskeletal complaints other than usual "aches and pains".   PHYSICAL EXAM BP 170/92  Pulse 64  Ht 6' (1.829 m)  Wt 190 lb (86.183 kg)  BMI 25.76 kg/m2  General: Alert, oriented x3, no distress Head: no evidence of trauma, PERRL,  EOMI, no exophtalmos or lid lag, no myxedema, no xanthelasma; normal ears, nose and oropharynx Neck: normal jugular venous pulsations and no hepatojugular reflux; brisk carotid pulses without delay and no carotid bruits Chest: clear to auscultation, no signs of consolidation by percussion or palpation, normal fremitus, symmetrical and full respiratory excursions Cardiovascular: normal position and quality of the apical impulse, regular rhythm, normal first and second heart sounds, no murmurs, rubs or gallops Abdomen:  no tenderness or distention, no masses by palpation, no abnormal pulsatility or arterial bruits, normal bowel sounds, no hepatosplenomegaly Extremities: no clubbing, cyanosis or edema; 2+ radial, ulnar and brachial pulses bilaterally; 2+ right femoral, posterior tibial and dorsalis pedis pulses; 2+ left femoral, posterior tibial and dorsalis pedis pulses; no subclavian or femoral bruits Neurological: grossly nonfocal   EKG: Sinus rhythm, no acute repolarization abnormalities  Lipid Panel     Component Value Date/Time   CHOL 66 03/01/2011 1602   TRIG 37.0 03/01/2011 1602   HDL 24.40* 03/01/2011 1602   CHOLHDL 3 03/01/2011 1602   VLDL 7.4 03/01/2011 1602   LDLCALC 34 03/01/2011 1602    BMET    Component Value Date/Time   NA 144 07/17/2013 0817   K 3.1* 07/17/2013 0817   CL 101 07/17/2013 0817   CO2 27 07/12/2013 1215   GLUCOSE 122* 07/17/2013 0817   BUN 14 07/17/2013 0817   CREATININE 0.90 07/17/2013 0817   CALCIUM 9.1 07/12/2013 1215   GFRNONAA >90 07/12/2013 1215   GFRAA >90 07/12/2013 1215     ASSESSMENT AND PLAN Chronic combined systolic and diastolic CHF (congestive heart failure) Currently appears to have functional class I and is euvolemic, despite his high blood pressure. He is on a full dose of ACE inhibitor and today will increase his carvedilol. He is also on spinal active and. He is on a relatively small dose of loop diuretic.  CAD, 07/14/12- LAD/PDA DES He does  not have symptoms of active coronary insufficiency.  HYPERTENSION Increase carvedilol to 25 mg twice a day. Recheck in 2-3 weeks. Check renal duplex ultrasound for renal artery stenosis.   Orders Placed This Encounter  Procedures  . Basic metabolic panel  . Renal Artery Duplex Bilateral   Meds ordered this encounter  Medications  . DISCONTD: lisinopril (PRINIVIL,ZESTRIL) 20 MG tablet    Sig: Take 40 mg by mouth daily.  Marland Kitchen DISCONTD: carvedilol (COREG) 25 MG tablet    Sig: Take 1 tablet (25 mg total) by mouth 2 (two) times daily.    Dispense:  180 tablet    Refill:  5    Order Specific Question:  Supervising Provider    Answer:  Nicki Guadalajara A [[id number]]  . lisinopril (PRINIVIL,ZESTRIL) 40 MG tablet    Sig: Take 40 mg by mouth daily.    Junious Silk, MD, [facility] [phone] office [phone] pager

## 2013-07-23 ENCOUNTER — Encounter: Payer: Self-pay | Admitting: Endocrinology

## 2013-07-23 ENCOUNTER — Ambulatory Visit (INDEPENDENT_AMBULATORY_CARE_PROVIDER_SITE_OTHER): Payer: 59 | Admitting: Endocrinology

## 2013-07-23 VITALS — BP 142/88 | HR 82 | Temp 97.7°F | Resp 12 | Wt 182.0 lb

## 2013-07-23 DIAGNOSIS — E119 Type 2 diabetes mellitus without complications: Secondary | ICD-10-CM

## 2013-07-23 DIAGNOSIS — E108 Type 1 diabetes mellitus with unspecified complications: Secondary | ICD-10-CM

## 2013-07-23 NOTE — Patient Instructions (Addendum)
Please reduce levemir to 35 units each morning and 35 units every evening, monday-friday.  on sat and sunday, take 55 units each am, and 40 units each evening.   on this insulin schedule, it is not safe to miss or delay meals.  It is also not safe to take extra insulin.  Please take just these amounts, no matter what your blood sugar is.   check your blood sugar 2 times a day.  vary the time of day when you check, between before the 3 meals, and at bedtime.  also check if you have symptoms of your blood sugar being too high or too low.  please keep a record of the readings and bring it to your next appointment here.  please call us sooner if you are having low blood sugar episodes.   blood tests are being requested for you today.  We'll contact you with results.  Please come back for a follow-up appointment in 1 month.

## 2013-07-23 NOTE — Progress Notes (Signed)
Subjective:    Patient ID: David Lane, male    DOB: 28-Nov-1945, 67 y.o.   MRN: [id number]  HPI Pt returns for f/u of insulin-requiring DM (dx'ed 1988, in the context of an episode of pancreatitis; he has chronic pancreatic insufficiency; he has mild if any neuropathy of the lower extremities; he has associated CAD; his last episode of severe hypoglycemia was in early may, 2014; he says this was probably due to taking more than the prescribed insulin dosage; he has never had DKA).  Last week, pt was seen in ER for hypertensive response to mild hypoglycemia.  On another occasion, he was seen in ER for mild hypoglycemia.  He also reports other episodes of mild hypoglycemia at work.   Past Medical History  Diagnosis Date  . Diabetes mellitus     Type II  . Hyperlipemia   . Hypertension   . Pancreatitis   . Anxiety   . Hepatic lesion 02/04/11  . GERD (gastroesophageal reflux disease)   . CAD (coronary artery disease)     last cath by [facility] DR.  Mihai Croitoru showing  some  disease involving LCX and small size of Diag   . NSTEMI (non-ST elevated myocardial infarction) 11/21/2009  . Shortness of breath   . CHF exacerbation, due to diastolic dysfunction 07/17/2012  . Ischemic cardiomyopathy     EF 35-40%  . Liver hemangioma     Past Surgical History  Procedure Laterality Date  . Rectal surgery      cyst  . Cardiac catheterization  2011    minimal disease, medical management  . Coronary angioplasty with stent placement  07/14/2012    successful PCI & stenting of mid LAD & PDA off the dominant CX    History   Social History  . Marital Status: Single    Spouse Name: N/A    Number of Children: 0  . Years of Education: N/A   Occupational History  . [employer]   .  [employer]   Social History Main Topics  . Smoking status: Never Smoker   . Smokeless tobacco: Never Used  . Alcohol Use: No  . Drug Use: No  . Sexual Activity: No   Other Topics Concern  . Not on file    Social History Narrative  . No narrative on file    Current Outpatient Prescriptions on File Prior to Visit  Medication Sig Dispense Refill  . amLODipine (NORVASC) 5 MG tablet Take 5 mg by mouth at bedtime.       Marland Kitchen aspirin 81 MG tablet Take 81 mg by mouth daily.        . carvedilol (COREG) 25 MG tablet Take 1 tablet (25 mg total) by mouth 2 (two) times daily.  180 tablet  5  . furosemide (LASIX) 40 MG tablet Take 1 tablet (40 mg total) by mouth daily.  90 tablet  1  . glucose blood (ACCU-CHEK AVIVA PLUS) test strip 1 each by Other route 2 (two) times daily. And lancets 2/day  180 each  12  . glucose blood test strip Use as instructed  100 each  12  . insulin detemir (LEVEMIR) 100 UNIT/ML injection Inject 40-50 Units into the skin 2 (two) times daily. 40 units during the week while working bid.  50 on Saturday and Sunday when not working 50Un in AM, 40U in PM.      . Insulin Pen Needle (PEN NEEDLES) 31G X 6 MM MISC 1 Device by Does  not apply route 2 (two) times daily.  60 each  11  . isosorbide mononitrate (IMDUR) 30 MG 24 hr tablet Take 1 tablet (30 mg total) by mouth daily.  30 tablet  5  . lisinopril (PRINIVIL,ZESTRIL) 40 MG tablet Take 40 mg by mouth daily.      . Multiple Vitamin (MULTIVITAMIN) capsule Take 1 capsule by mouth daily.        . nitroGLYCERIN (NITROSTAT) 0.4 MG SL tablet Place 1 tablet (0.4 mg total) under the tongue every 5 (five) minutes as needed for chest pain.  25 tablet  2  . omeprazole (PRILOSEC) 20 MG capsule Take 1 capsule (20 mg total) by mouth 2 (two) times daily.  180 capsule  1  . Pancrelipase, Lip-Prot-Amyl, 36000 UNITS CPEP Take 2 capsules by mouth See admin instructions. Take 2 capsules by mouth three times daily with meals and 1 capsule with each snack.  720 capsule  0  . potassium chloride (K-DUR) 10 MEQ tablet Take 1 tablet (10 mEq total) by mouth daily.  90 tablet  1  . simvastatin (ZOCOR) 40 MG tablet Take 1 tablet (40 mg total) by mouth at bedtime.  90  tablet  3  . spironolactone (ALDACTONE) 25 MG tablet Take 1 tablet (25 mg total) by mouth daily.  30 tablet  0   No current facility-administered medications on file prior to visit.    Allergies  Allergen Reactions  . Lipitor [Atorvastatin]     Muscle weakness   Family History  Problem Relation Age of Onset  . Diabetes Paternal Grandmother   . Diabetes Maternal Grandfather   . Colon cancer Neg Hx    BP 142/88  Pulse 82  Temp(Src) 97.7 F (36.5 C) (Oral)  Resp 12  Wt 182 lb (82.555 kg)  BMI 24.68 kg/m2  SpO2 98%  Review of Systems Denies LOC and weight change    Objective:   Physical Exam VITAL SIGNS:  See vs page GENERAL: no distress     Assessment & Plan:  DM: This insulin regimen was chosen from multiple options, due to its simplicity.  The benefits of glycemic control must be weighed against the risks of hypoglycemia.  It seems to be overcontrolled now. HTN: due to mild hypoglycemia.

## 2013-07-24 ENCOUNTER — Telehealth: Payer: Self-pay | Admitting: *Deleted

## 2013-07-24 NOTE — Telephone Encounter (Signed)
Spoke with Dr. Salena Saner - advised patient to take 18.75mg  carvedilol in AM, 25mg  in PM. RN called patient to inform of medication recommendations. Patient verbalized understanding and agreed with plan. Will call back if this dosage is not working.

## 2013-07-24 NOTE — Telephone Encounter (Signed)
Patient saw Dr. Salena Saner in office on 07/20/13 - carvedilol was increased form 12.5mg  BID to 25mg  BID. Patient reports started taking new dosage today.  BP at 45m 182/99 Lunchtime 94/51 3:30pm 121/66  Reports feeling a little dizzy, having a headaches, and low energy when low BP around lunchtime.  Wanted to know if should continue to take the 25mg  dosage in the evening, since BP dropped so drastically.   Informed patient would talk to [place], [place] or Dr. Salena Saner.

## 2013-07-24 NOTE — Telephone Encounter (Signed)
Dr. Salena Saner changed his medication and he changed his dose of Coreg. He said that he was feeling lightheaded today BP 97/51. He wants to know if it is that low if he needs to take his next dose at 6.

## 2013-07-27 ENCOUNTER — Ambulatory Visit: Payer: 59 | Admitting: Cardiovascular Disease

## 2013-07-29 NOTE — Assessment & Plan Note (Signed)
He does not have symptoms of active coronary insufficiency.

## 2013-07-29 NOTE — Assessment & Plan Note (Signed)
Currently appears to have functional class I and is euvolemic, despite his high blood pressure. He is on a full dose of ACE inhibitor and today will increase his carvedilol. He is also on spinal active and. He is on a relatively small dose of loop diuretic.

## 2013-07-29 NOTE — Assessment & Plan Note (Signed)
Increase carvedilol to 25 mg twice a day. Recheck in 2-3 weeks. Check renal duplex ultrasound for renal artery stenosis.

## 2013-08-01 ENCOUNTER — Ambulatory Visit (HOSPITAL_COMMUNITY)
Admission: RE | Admit: 2013-08-01 | Discharge: 2013-08-01 | Disposition: A | Discharge status: Home / Self Care | Payer: 59 | Source: Ambulatory Visit | Attending: Cardiovascular Disease | Admitting: Cardiovascular Disease

## 2013-08-01 DIAGNOSIS — I509 Heart failure, unspecified: Secondary | ICD-10-CM | POA: Insufficient documentation

## 2013-08-01 DIAGNOSIS — R079 Chest pain, unspecified: Secondary | ICD-10-CM | POA: Insufficient documentation

## 2013-08-01 NOTE — Progress Notes (Signed)
2D Echo Performed 10/11/2012    David Lane, RCS  

## 2013-08-07 ENCOUNTER — Ambulatory Visit (INDEPENDENT_AMBULATORY_CARE_PROVIDER_SITE_OTHER): Payer: 59 | Admitting: Cardiovascular Disease

## 2013-08-07 ENCOUNTER — Encounter: Payer: Self-pay | Admitting: Cardiovascular Disease

## 2013-08-07 ENCOUNTER — Encounter (HOSPITAL_COMMUNITY): Payer: Self-pay | Admitting: *Deleted

## 2013-08-07 VITALS — BP 128/70 | HR 60 | Ht 72.0 in | Wt 182.0 lb

## 2013-08-07 DIAGNOSIS — I509 Heart failure, unspecified: Secondary | ICD-10-CM

## 2013-08-07 DIAGNOSIS — I2510 Atherosclerotic heart disease of native coronary artery without angina pectoris: Secondary | ICD-10-CM

## 2013-08-07 DIAGNOSIS — I10 Essential (primary) hypertension: Secondary | ICD-10-CM

## 2013-08-07 DIAGNOSIS — E119 Type 2 diabetes mellitus without complications: Secondary | ICD-10-CM

## 2013-08-07 DIAGNOSIS — Z79899 Other long term (current) drug therapy: Secondary | ICD-10-CM

## 2013-08-07 DIAGNOSIS — I5042 Chronic combined systolic (congestive) and diastolic (congestive) heart failure: Secondary | ICD-10-CM

## 2013-08-07 DIAGNOSIS — E108 Type 1 diabetes mellitus with unspecified complications: Secondary | ICD-10-CM

## 2013-08-07 NOTE — Assessment & Plan Note (Signed)
I think he is very likely primarily a type I diabetic since he has pancreatic insufficiency following severe acute pancreatitis. This may explain rather labile glucose levels, still not well controlled.

## 2013-08-07 NOTE — Patient Instructions (Signed)
Your physician has requested that you have a renal artery duplex. During this test, an ultrasound is used to evaluate blood flow to the kidneys. Allow one hour for this exam. Do not eat after midnight the day before and avoid carbonated beverages. Take your medications as you usually do.  Your physician recommends that you return for lab work in: One Month.  Your physician recommends that you schedule a follow-up appointment in: One Month (after Dec. 17, 2014 ).

## 2013-08-07 NOTE — Assessment & Plan Note (Signed)
Angina free nearly 4 years status post non-ST segment elevation myocardial infarction with mid LAD drug-eluting stent as well as drug-eluting stent to the posterior descending artery branch of the left circumflex.

## 2013-08-07 NOTE — Assessment & Plan Note (Signed)
Appears clinically euvolemic. No signs or symptoms of heart failure exacerbation

## 2013-08-07 NOTE — Assessment & Plan Note (Signed)
Well controlled today on multiple medications (spironalactone 25mg  , caredilol 25 mg BID, amlodipine 5 mg daily, lasix 40mg  daily, imdur 30mg  daily, lisinopril 40mg  daily) . Believe patient may have element of hyperaldosteronism given  response to spironolactone and well controlled blood pressure today. Have rescheduled his renal duplex ultrasound to evaluate for renovascular causes of hypertension. Patient will follow up in 6 weeks. He will have repeat CMET at that time specifically to evaluate potassium on spironalactone.

## 2013-08-07 NOTE — Progress Notes (Signed)
Patient ID: David Lane, male   DOB: 04-28-1946, 67 y.o.   MRN: [id number]  Reason for office visit Resistant Hypertension Follow up  History: He has a history of coronary disease and received a stent to the LAD artery and a stent to the posterior descending artery in 2013. He has moderately depressed left ventricular systolic function with an ejection fraction of 35-40% and has previously presented with pulmonary edema secondary to heart failure. A repeat echocardiogram on 08/01/13 showed an improved ejection fraction of 50-55% with mild concentric hypertrophy.   Interval History: Since patient's last office visit and being started on spironolactone 3 days before that visit (through consultation by phone with Dr. Anitra Lauth), his blood pressures have been much better controlled. Patient brings a log of his blood pressures today which shows Systolic blood pressures have trended down from 160s 170s to 140s to 150s when he checks it in the AM. His repeat echocardiogram as above showed an improvement in EF. Patient is without complaints today and he is pleased with his progress in his blood pressure. He has tolerated spironolactone well without complaints in addition to his other blood pressure medications. He has not yet had his renal artery ultrasound. Blood pressure well controlled today.   Allergies  Allergen Reactions  . Lipitor [Atorvastatin]     Muscle weakness    Current Outpatient Prescriptions  Medication Sig Dispense Refill  . amLODipine (NORVASC) 5 MG tablet Take 5 mg by mouth at bedtime.       Marland Kitchen aspirin 81 MG tablet Take 81 mg by mouth daily.        . carvedilol (COREG) 25 MG tablet Take 18.75-25 mg by mouth 2 (two) times daily. 18.75mg  AM 25mg  PM      . furosemide (LASIX) 40 MG tablet Take 1 tablet (40 mg total) by mouth daily.  90 tablet  1  . glucose blood (ACCU-CHEK AVIVA PLUS) test strip 1 each by Other route 2 (two) times daily. And lancets 2/day  180 each  12  .  glucose blood test strip Use as instructed  100 each  12  . insulin detemir (LEVEMIR) 100 UNIT/ML injection Inject 40-50 Units into the skin 2 (two) times daily. 40 units during the week while working bid.  50 on Saturday and Sunday when not working 50Un in AM, 40U in PM.      . Insulin Pen Needle (PEN NEEDLES) 31G X 6 MM MISC 1 Device by Does not apply route 2 (two) times daily.  60 each  11  . isosorbide mononitrate (IMDUR) 30 MG 24 hr tablet Take 1 tablet (30 mg total) by mouth daily.  30 tablet  5  . lisinopril (PRINIVIL,ZESTRIL) 40 MG tablet Take 40 mg by mouth daily.      . Multiple Vitamin (MULTIVITAMIN) capsule Take 1 capsule by mouth daily.        . nitroGLYCERIN (NITROSTAT) 0.4 MG SL tablet Place 1 tablet (0.4 mg total) under the tongue every 5 (five) minutes as needed for chest pain.  25 tablet  2  . omeprazole (PRILOSEC) 20 MG capsule Take 1 capsule (20 mg total) by mouth 2 (two) times daily.  180 capsule  1  . Pancrelipase, Lip-Prot-Amyl, 36000 UNITS CPEP Take 2 capsules by mouth See admin instructions. Take 2 capsules by mouth three times daily with meals and 1 capsule with each snack.  720 capsule  0  . simvastatin (ZOCOR) 40 MG tablet Take 1 tablet (40 mg  total) by mouth at bedtime.  90 tablet  3  . spironolactone (ALDACTONE) 25 MG tablet Take 1 tablet (25 mg total) by mouth daily.  30 tablet  0   No current facility-administered medications for this visit.    Past Medical History  Diagnosis Date  . Diabetes mellitus     Type II  . Hyperlipemia   . Hypertension   . Pancreatitis   . Anxiety   . Hepatic lesion 02/04/11  . GERD (gastroesophageal reflux disease)   . CAD (coronary artery disease)     last cath by [facility] DR.  Mihai Croitoru showing  some  disease involving LCX and small size of Diag   . NSTEMI (non-ST elevated myocardial infarction) 11/21/2009  . Shortness of breath   . CHF exacerbation, due to diastolic dysfunction 07/17/2012  . Ischemic cardiomyopathy     EF  35-40%  . Liver hemangioma     Past Surgical History  Procedure Laterality Date  . Rectal surgery      cyst  . Cardiac catheterization  2011    minimal disease, medical management  . Coronary angioplasty with stent placement  07/14/2012    successful PCI & stenting of mid LAD & PDA off the dominant CX    Family History  Problem Relation Age of Onset  . Diabetes Paternal Grandmother   . Diabetes Maternal Grandfather   . Colon cancer Neg Hx     History   Social History  . Marital Status: Single    Spouse Name: N/A    Number of Children: 0  . Years of Education: N/A   Occupational History  . [employer]   .  [employer]   Social History Main Topics  . Smoking status: Never Smoker   . Smokeless tobacco: Never Used  . Alcohol Use: No  . Drug Use: No  . Sexual Activity: No   Other Topics Concern  . Not on file   Social History Narrative  . No narrative on file    Review of systems:  The patient specifically denies any chest pain at rest or with exertion, dyspnea at rest or with exertion, orthopnea, paroxysmal nocturnal dyspnea, syncope, palpitations, focal neurological deficits,  lower extremity edema, unexplained weight gain, cough, hemoptysis or wheezing.  PHYSICAL EXAM BP 128/70  Pulse 60  Ht 6' (1.829 m)  Wt 182 lb (82.555 kg)  BMI 24.68 kg/m2  General: Alert, oriented x3, no distress Chest: clear to auscultation bilaterally, symmetrical and full respiratory excursions Cardiovascular: normal position and quality of the apical impulse, regular rhythm, normal first and second heart sounds, no murmurs, rubs or gallops Abdomen: no tenderness or distention Extremities: no edema, 2+ radial pulses Neurological: grossly nonfocal, normal gait,   Lipid Panel     Component Value Date/Time   CHOL 66 03/01/2011 1602   TRIG 37.0 03/01/2011 1602   HDL 24.40* 03/01/2011 1602   CHOLHDL 3 03/01/2011 1602   VLDL 7.4 03/01/2011 1602   LDLCALC 34 03/01/2011 1602    BMET     Component Value Date/Time   NA 144 07/17/2013 0817   K 3.1* 07/17/2013 0817   CL 101 07/17/2013 0817   CO2 27 07/12/2013 1215   GLUCOSE 122* 07/17/2013 0817   BUN 14 07/17/2013 0817   CREATININE 0.90 07/17/2013 0817   CALCIUM 9.1 07/12/2013 1215   GFRNONAA >90 07/12/2013 1215   GFRAA >90 07/12/2013 1215     ASSESSMENT AND PLAN HYPERTENSION Well controlled today on multiple  medications (spironalactone 25mg  , caredilol 25 mg BID, amlodipine 5 mg daily, lasix 40mg  daily, imdur 30mg  daily, lisinopril 40mg  daily) . Believe patient may have element of hyperaldosteronism given  response to spironolactone and well controlled blood pressure today. Have rescheduled his renal duplex ultrasound to evaluate for renovascular causes of hypertension. Patient will follow up in 6 weeks. He will have repeat CMET at that time specifically to evaluate potassium on spironalactone.   Chronic combined systolic and diastolic CHF (congestive heart failure) Appears clinically euvolemic. No signs or symptoms of heart failure exacerbation  CAD, 07/14/12- LAD/PDA DES Angina free nearly 4 years status post non-ST segment elevation myocardial infarction with mid LAD drug-eluting stent as well as drug-eluting stent to the posterior descending artery branch of the left circumflex.   DIABETES MELLITUS, TYPE II, labile I think he is very likely primarily a type I diabetic since he has pancreatic insufficiency following severe acute pancreatitis. This may explain rather labile glucose levels, still not well controlled.   Orders Placed This Encounter  Procedures  . Comp Met (CMET)    Dr. Royann Shivers has seen and evaluated the patient. We have discussed the history, exam, assessment, and plan as noted above. He agrees with management.   Patient Instructions  Your physician has requested that you have a renal artery duplex. During this test, an ultrasound is used to evaluate blood flow to the kidneys. Allow one hour for  this exam. Do not eat after midnight the day before and avoid carbonated beverages. Take your medications as you usually do.  Your physician recommends that you return for lab work in: One Month.  Your physician recommends that you schedule a follow-up appointment in: One Month (after Dec. 17, 2014 ).       Aldine Contes. Marti Sleigh, MD, PGY3 [facility] 08/07/2013 10:08 AM  Thurmon Fair, MD, [facility] [phone] office [phone] pager

## 2013-08-16 ENCOUNTER — Other Ambulatory Visit (HOSPITAL_COMMUNITY): Payer: Self-pay | Admitting: Cardiology

## 2013-08-16 NOTE — Telephone Encounter (Signed)
Rx was sent to pharmacy electronically. 

## 2013-08-17 ENCOUNTER — Other Ambulatory Visit: Payer: 59 | Admitting: *Deleted

## 2013-08-17 ENCOUNTER — Other Ambulatory Visit (HOSPITAL_COMMUNITY): Payer: Self-pay | Admitting: Endocrinology

## 2013-08-17 MED ORDER — PEN NEEDLES 31G X 6 MM MISC: 1.0000 | Freq: Two times a day (BID) | Status: DC

## 2013-08-17 MED ORDER — ACCU-CHEK SOFTCLIX LANCETS MISC: Status: DC

## 2013-08-27 ENCOUNTER — Ambulatory Visit (INDEPENDENT_AMBULATORY_CARE_PROVIDER_SITE_OTHER): Payer: 59 | Admitting: Endocrinology

## 2013-08-27 ENCOUNTER — Telehealth: Payer: Self-pay | Admitting: Endocrinology

## 2013-08-27 ENCOUNTER — Encounter: Payer: Self-pay | Admitting: Endocrinology

## 2013-08-27 ENCOUNTER — Encounter: Payer: Self-pay | Admitting: *Deleted

## 2013-08-27 VITALS — BP 124/90 | HR 85 | Temp 98.3°F | Ht 72.0 in | Wt 182.0 lb

## 2013-08-27 DIAGNOSIS — E108 Type 1 diabetes mellitus with unspecified complications: Secondary | ICD-10-CM

## 2013-08-27 LAB — HEMOGLOBIN A1C: Hgb A1c MFr Bld: 9.5 % — ABNORMAL HIGH (ref 4.6–6.5)

## 2013-08-27 MED ORDER — GLUCOSE BLOOD VI STRP: 1.0000 | ORAL_STRIP | Freq: Two times a day (BID) | Status: DC

## 2013-08-27 MED ORDER — INSULIN GLARGINE 100 UNIT/ML SOLOSTAR PEN: 60.0000 [IU] | PEN_INJECTOR | SUBCUTANEOUS | Status: DC

## 2013-08-27 NOTE — Telephone Encounter (Signed)
Changing from a bid insulin to a qd.

## 2013-08-27 NOTE — Telephone Encounter (Signed)
please call patient: Blood sugar is still high Please change levemir to lantus alone, 60 units qam on non-work days. On work days, take just 40 units  Please come back for a follow-up appointment in 1-2 weeks

## 2013-08-27 NOTE — Patient Instructions (Addendum)
Please continue levemir, 35 units each morning and 35 units every evening, on workdays.  on days you don't work, take 55 units each am, and 40 units each evening.   on this insulin schedule, it is not safe to miss or delay meals.  It is also not safe to take extra insulin.  Please take just these amounts, no matter what your blood sugar is.   check your blood sugar 2 times a day.  vary the time of day when you check, between before the 3 meals, and at bedtime.  also check if you have symptoms of your blood sugar being too high or too low.  please keep a record of the readings and bring it to your next appointment here.  please call us sooner if you are having low blood sugar episodes.   blood tests are being requested for you today.  We'll contact you with results.  Please come back for a follow-up appointment in January.   (addendum: pt was advised to change to lantus, 60 units each morning.  On non-work days, take just 40 units).

## 2013-08-27 NOTE — Telephone Encounter (Signed)
Blood sugar is still high. Please change levemir to lantus alone, 60 units qam on non-work days.  On work days, take just 40 units. Please come back for a follow-up appointment in 1-2 weeks. Pt wants to know what is the difference? Please advise.

## 2013-08-27 NOTE — Progress Notes (Signed)
Subjective:    Patient ID: David Lane, male    DOB: August 05, 1946, 67 y.o.   MRN: [id number]  HPI Pt returns for f/u of insulin-requiring DM (dx'ed 1988, in the context of an episode of pancreatitis; he has chronic pancreatic insufficiency; he has mild if any neuropathy of the lower extremities; he has associated CAD; his last episode of severe hypoglycemia was in early may, 2014; he says this was probably due to taking more than the prescribed insulin dosage; he has never had DKA; in October of 2014, he was seen in ER twice--once for hypertensive response to mild hypoglycemia, and again for mild hypoglycemia).   he brings a record of his cbg's which i have reviewed today.  He has had 2 episodes of hypoglycemia since last ov.  One was at 10 am, on a workday.  The other was 2 am, on a non-workday.  Most cbg's are in the 200's.   Past Medical History  Diagnosis Date  . Diabetes mellitus     Type II  . Hyperlipemia   . Hypertension   . Pancreatitis   . Anxiety   . Hepatic lesion 02/04/11  . GERD (gastroesophageal reflux disease)   . CAD (coronary artery disease)     last cath by [facility] DR.  Mihai Croitoru showing  some  disease involving LCX and small size of Diag   . NSTEMI (non-ST elevated myocardial infarction) 11/21/2009  . Shortness of breath   . CHF exacerbation, due to diastolic dysfunction 07/17/2012  . Ischemic cardiomyopathy     EF 35-40%  . Liver hemangioma     Past Surgical History  Procedure Laterality Date  . Rectal surgery      cyst  . Cardiac catheterization  2011    minimal disease, medical management  . Coronary angioplasty with stent placement  07/14/2012    successful PCI & stenting of mid LAD & PDA off the dominant CX    History   Social History  . Marital Status: Single    Spouse Name: N/A    Number of Children: 0  . Years of Education: N/A   Occupational History  . [employer]   .  [employer]   Social History Main Topics  . Smoking status:  Never Smoker   . Smokeless tobacco: Never Used  . Alcohol Use: No  . Drug Use: No  . Sexual Activity: No   Other Topics Concern  . Not on file   Social History Narrative  . No narrative on file    Current Outpatient Prescriptions on File Prior to Visit  Medication Sig Dispense Refill  . ACCU-CHEK SOFTCLIX LANCETS lancets Use as instructed  100 each  12  . amLODipine (NORVASC) 5 MG tablet Take 5 mg by mouth at bedtime.       Marland Kitchen aspirin 81 MG tablet Take 81 mg by mouth daily.        . carvedilol (COREG) 25 MG tablet Take 18.75-25 mg by mouth 2 (two) times daily. 18.75mg  AM 25mg  PM      . furosemide (LASIX) 40 MG tablet Take 1 tablet (40 mg total) by mouth daily.  90 tablet  1  . glucose blood test strip Use as instructed  100 each  12  . Insulin Pen Needle (PEN NEEDLES) 31G X 6 MM MISC 1 Device by Does not apply route 2 (two) times daily.  90 each  3  . isosorbide mononitrate (IMDUR) 30 MG 24 hr tablet  TAKE 1 TABLET (30 MG TOTAL) BY MOUTH DAILY.  30 tablet  5  . lisinopril (PRINIVIL,ZESTRIL) 40 MG tablet Take 40 mg by mouth daily.      . Multiple Vitamin (MULTIVITAMIN) capsule Take 1 capsule by mouth daily.        . nitroGLYCERIN (NITROSTAT) 0.4 MG SL tablet Place 1 tablet (0.4 mg total) under the tongue every 5 (five) minutes as needed for chest pain.  25 tablet  2  . omeprazole (PRILOSEC) 20 MG capsule Take 1 capsule (20 mg total) by mouth 2 (two) times daily.  180 capsule  1  . Pancrelipase, Lip-Prot-Amyl, 36000 UNITS CPEP Take 2 capsules by mouth See admin instructions. Take 2 capsules by mouth three times daily with meals and 1 capsule with each snack.  720 capsule  0  . simvastatin (ZOCOR) 40 MG tablet Take 1 tablet (40 mg total) by mouth at bedtime.  90 tablet  3  . spironolactone (ALDACTONE) 25 MG tablet TAKE 1 TABLET (25 MG TOTAL) BY MOUTH DAILY.  30 tablet  5   No current facility-administered medications on file prior to visit.    Allergies  Allergen Reactions  . Lipitor  [Atorvastatin]     Muscle weakness    Family History  Problem Relation Age of Onset  . Diabetes Paternal Grandmother   . Diabetes Maternal Grandfather   . Colon cancer Neg Hx    BP 124/90  Pulse 85  Temp(Src) 98.3 F (36.8 C) (Oral)  Ht 6' (1.829 m)  Wt 182 lb (82.555 kg)  BMI 24.68 kg/m2  SpO2 97%  Review of Systems Denies LOC and weight change.      Objective:   Physical Exam VITAL SIGNS:  See vs page GENERAL: no distress PSYCH: Alert and oriented x 3.  appears anxious, but  not depressed.    Lab Results  Component Value Date   HGBA1C 9.5* 08/27/2013      Assessment & Plan:  DM: This insulin regimen was chosen from multiple options, due to its simplicity.  However, he seems to need further simplification of his schedule.  The benefits of glycemic control must be weighed against the risks of hypoglycemia.   CAD: in this context, he needs to avoid hypoglycemia.

## 2013-08-28 NOTE — Telephone Encounter (Signed)
Called pt and advised him the reason for the change in insulin is, Dr Everardo All is changing from a bid insulin to a qd. Advised him if he had any questions to call our office.

## 2013-08-31 ENCOUNTER — Other Ambulatory Visit: Payer: Self-pay | Admitting: Pharmacist Clinician (PhC)/ Clinical Pharmacy Specialist

## 2013-08-31 NOTE — Telephone Encounter (Signed)
Rx was sent to pharmacy electronically. 

## 2013-09-13 ENCOUNTER — Ambulatory Visit (HOSPITAL_COMMUNITY): Payer: 59

## 2013-09-14 ENCOUNTER — Ambulatory Visit (HOSPITAL_COMMUNITY)
Admission: RE | Admit: 2013-09-14 | Discharge: 2013-09-14 | Disposition: A | Discharge status: Home / Self Care | Payer: 59 | Source: Ambulatory Visit | Attending: Cardiovascular Disease | Admitting: Cardiovascular Disease

## 2013-09-14 ENCOUNTER — Telehealth: Payer: Self-pay | Admitting: Internal Medicine

## 2013-09-14 DIAGNOSIS — I701 Atherosclerosis of renal artery: Secondary | ICD-10-CM

## 2013-09-14 DIAGNOSIS — I10 Essential (primary) hypertension: Secondary | ICD-10-CM | POA: Insufficient documentation

## 2013-09-14 LAB — COMPREHENSIVE METABOLIC PANEL
ALT: 74 U/L — ABNORMAL HIGH (ref 0–53)
AST: 29 U/L (ref 0–37)
Albumin: 3.6 g/dL (ref 3.5–5.2)
Alkaline Phosphatase: 127 U/L — ABNORMAL HIGH (ref 39–117)
BUN: 19 mg/dL (ref 6–23)
Calcium: 8.4 mg/dL (ref 8.4–10.5)
Chloride: 105 mEq/L (ref 96–112)
Glucose, Bld: 198 mg/dL — ABNORMAL HIGH (ref 70–99)
Potassium: 4.7 mEq/L (ref 3.5–5.3)
Sodium: 137 mEq/L (ref 135–145)
Total Protein: 6 g/dL (ref 6.0–8.3)

## 2013-09-14 MED ORDER — PANCRELIPASE (LIP-PROT-AMYL) 36000-114000 UNITS PO CPEP: 2.0000 | ORAL_CAPSULE | ORAL | Status: DC

## 2013-09-14 NOTE — Telephone Encounter (Signed)
rx sent. Patient scheduled for an appointment 10/31/2013.

## 2013-09-14 NOTE — Progress Notes (Signed)
Renal Artery Duplex Completed. °Brianna L Mazza,RVT °

## 2013-09-17 ENCOUNTER — Other Ambulatory Visit: Payer: Self-pay | Admitting: Internal Medicine

## 2013-09-17 ENCOUNTER — Other Ambulatory Visit (HOSPITAL_COMMUNITY): Payer: Self-pay | Admitting: Physician Assistant

## 2013-09-18 ENCOUNTER — Ambulatory Visit (INDEPENDENT_AMBULATORY_CARE_PROVIDER_SITE_OTHER): Payer: 59 | Admitting: Cardiovascular Disease

## 2013-09-18 ENCOUNTER — Telehealth: Payer: Self-pay | Admitting: *Deleted

## 2013-09-18 ENCOUNTER — Encounter: Payer: Self-pay | Admitting: Cardiovascular Disease

## 2013-09-18 VITALS — BP 158/90 | HR 88 | Ht 72.0 in | Wt 195.3 lb

## 2013-09-18 DIAGNOSIS — I10 Essential (primary) hypertension: Secondary | ICD-10-CM

## 2013-09-18 DIAGNOSIS — Z79899 Other long term (current) drug therapy: Secondary | ICD-10-CM

## 2013-09-18 DIAGNOSIS — I2510 Atherosclerotic heart disease of native coronary artery without angina pectoris: Secondary | ICD-10-CM

## 2013-09-18 DIAGNOSIS — E782 Mixed hyperlipidemia: Secondary | ICD-10-CM

## 2013-09-18 MED ORDER — CARVEDILOL 25 MG PO TABS: 37.5000 mg | ORAL_TABLET | Freq: Two times a day (BID) | ORAL | Status: DC

## 2013-09-18 NOTE — Assessment & Plan Note (Signed)
Asymptomatic.NSTEMI Feb 2011- medical Rx 07/14/2012 2.5 x 50 mm expedition drug-eluting stent the mid LAD and 2.5 x 18 mm expedition drug-eluting stent to the posterior descending artery branch of the circumflex vessel coronary artery

## 2013-09-18 NOTE — Telephone Encounter (Signed)
Message copied by Vita Barley on Tue Sep 18, 2013  4:34 PM ------      Message from: Thurmon Fair      Created: Tue Sep 18, 2013  9:21 AM       Sorry, after he left I realized that he should have a lipid profile and see minutes just before his next appointment in 3 months ------

## 2013-09-18 NOTE — Patient Instructions (Signed)
Increase coreg to 37.5 mg twice a day ( 1 1/2 tablets equal 37.5 mg )  Your physician recommends that you schedule a follow-up appointment in: 3 months with dr Royann Shivers

## 2013-09-18 NOTE — Assessment & Plan Note (Signed)
Severely elevated blood pressure readings have no longer been recorded. I'm not sure if this is because he has started to be a little more relaxed about his blood pressure readings or because of the changes in medication. Regardless his blood pressure tends to be smoother now and continues to be on the average greater than 140/90. He did not have evidence of renal artery stenosis by duplex ultrasonography, but had a good response to Spiriva lactone. He has normal renal function and his potassium is now in the normal range. Blood pressure control is not yet perfect and I have advised that he increase his carvedilol to 37.5 mg twice a day.

## 2013-09-18 NOTE — Telephone Encounter (Signed)
Fasting labs needed in 3 months.  Order placed and mailed to patient.

## 2013-09-18 NOTE — Progress Notes (Signed)
Patient ID: David Lane, male   DOB: 1946/08/22, 67 y.o.   MRN: [id number]      Reason for office visit Severe hypertension, diabetes mellitus, hypercholesterolemia  David Lane is feeling well. No longer is quite as anxious about his health. His blood pressure at home has been better. When he is very relaxed he has occasionally seen a diastolic blood pressure in the 80s. His systolic typically remains just above 140. He is tolerating the Aldactone well, and followup laboratory tests show a potassium of 4.7 and normal BUN and creatinine. He had very minor increases in one of his transaminases, which is not a new finding. Glycemic control remains a challenge(A1c 9.5%) and his insulin prescription has recently been adjusted. He is tolerating simvastatin without myalgia, but was poorly tolerant of atorvastatin do to shoulder weakness and heaviness. He ran out of his pancreatic enzyme supplement and developed diarrhea consistent with malabsorption.  He has a history of coronary disease and received a stent to the LAD artery and a stent to the posterior descending artery in 2013. He has moderately depressed left ventricular systolic function with an ejection fraction of 35-40% and has previously presented with pulmonary edema secondary to heart failure. A repeat echocardiogram on 08/01/13 showed an improved ejection fraction of 50-55% with mild concentric hypertrophy.       Allergies  Allergen Reactions  . Lipitor [Atorvastatin]     Muscle weakness    Current Outpatient Prescriptions  Medication Sig Dispense Refill  . ACCU-CHEK SOFTCLIX LANCETS lancets Use as instructed  100 each  12  . amLODipine (NORVASC) 5 MG tablet Take 5 mg by mouth at bedtime.       Marland Kitchen aspirin 81 MG tablet Take 81 mg by mouth daily.        . furosemide (LASIX) 40 MG tablet TAKE 1 TABLET (40 MG TOTAL) BY MOUTH DAILY.  90 tablet  3  . glucose blood (ONE TOUCH ULTRA TEST) test strip 1 each by Other route 2 (two) times daily.  And lancets 2/day 250.01  100 each  12  . glucose blood test strip Use as instructed  100 each  12  . Insulin Glargine (LANTUS SOLOSTAR) 100 UNIT/ML SOPN Inject 60 Units into the skin every morning. And pen needles 1/day  10 pen  PRN  . Insulin Pen Needle (PEN NEEDLES) 31G X 6 MM MISC 1 Device by Does not apply route 2 (two) times daily.  90 each  3  . isosorbide mononitrate (IMDUR) 30 MG 24 hr tablet TAKE 1 TABLET (30 MG TOTAL) BY MOUTH DAILY.  30 tablet  5  . lisinopril (PRINIVIL,ZESTRIL) 40 MG tablet Take 40 mg by mouth daily.      . Multiple Vitamin (MULTIVITAMIN) capsule Take 1 capsule by mouth daily.        . nitroGLYCERIN (NITROSTAT) 0.4 MG SL tablet Place 1 tablet (0.4 mg total) under the tongue every 5 (five) minutes as needed for chest pain.  25 tablet  2  . omeprazole (PRILOSEC) 20 MG capsule TAKE ONE CAPSULE TWICE A DAY  180 capsule  0  . Pancrelipase, Lip-Prot-Amyl, 36000 UNITS CPEP Take 2 capsules by mouth See admin instructions. Take 2 capsules by mouth three times daily with meals and 1 capsule with each snack.  720 capsule  1  . simvastatin (ZOCOR) 40 MG tablet Take 1 tablet (40 mg total) by mouth at bedtime.  90 tablet  3  . spironolactone (ALDACTONE) 25 MG tablet TAKE 1 TABLET (  25 MG TOTAL) BY MOUTH DAILY.  30 tablet  5  . carvedilol (COREG) 25 MG tablet Take 1.5 tablets (37.5 mg total) by mouth 2 (two) times daily. TAKE 1 1/2 TABLETS TWICE A DAY TO EQUAL 37.5MG   270 tablet  3   No current facility-administered medications for this visit.    Past Medical History  Diagnosis Date  . Diabetes mellitus     Type II  . Hyperlipemia   . Hypertension   . Pancreatitis   . Anxiety   . Hepatic lesion 02/04/11  . GERD (gastroesophageal reflux disease)   . CAD (coronary artery disease)     last cath by David Lane DR.  Tylan Lane showing  some  disease involving LCX and small size of Diag   . NSTEMI (non-ST elevated myocardial infarction) 11/21/2009  . Shortness of breath   . CHF  exacerbation, due to diastolic dysfunction 07/17/2012  . Ischemic cardiomyopathy     EF 35-40%  . Liver hemangioma     Past Surgical History  Procedure Laterality Date  . Rectal surgery      cyst  . Cardiac catheterization  2011    minimal disease, medical management  . Coronary angioplasty with stent placement  07/14/2012    successful PCI & stenting of mid LAD & PDA off the dominant CX    Family History  Problem Relation Age of Onset  . Diabetes Paternal Grandmother   . Diabetes Maternal Grandfather   . Colon cancer Neg Hx     History   Social History  . Marital Status: Single    Spouse Name: N/A    Number of Children: 0  . Years of Education: N/A   Occupational History  . [employer]   .  [employer]   Social History Main Topics  . Smoking status: Never Smoker   . Smokeless tobacco: Never Used  . Alcohol Use: No  . Drug Use: No  . Sexual Activity: No   Other Topics Concern  . Not on file   Social History Narrative  . No narrative on file    Review of systems: The patient specifically denies any chest pain at rest or with exertion, dyspnea at rest or with exertion, orthopnea, paroxysmal nocturnal dyspnea, syncope, palpitations, focal neurological deficits, intermittent claudication, lower extremity edema, unexplained weight gain, cough, hemoptysis or wheezing.  The patient also denies abdominal pain, nausea, vomiting, dysphagia, diarrhea, constipation, polyuria, polydipsia, dysuria, hematuria, frequency, urgency, abnormal bleeding or bruising, fever, chills, unexpected weight changes, mood swings, change in skin or hair texture, change in voice quality, auditory or visual problems, allergic reactions or rashes, new musculoskeletal complaints other than usual "aches and pains".   PHYSICAL EXAM BP 158/90  Pulse 88  Ht 6' (1.829 m)  Wt 195 lb 4.8 oz (88.587 kg)  BMI 26.48 kg/m2 BP recheck 143/92 mm Hg after 15 minutes at rest General: Alert, oriented x3,  no distress  Head: no evidence of trauma, PERRL, EOMI, no exophtalmos or lid lag, no myxedema, no xanthelasma; normal ears, nose and oropharynx  Neck: normal jugular venous pulsations and no hepatojugular reflux; brisk carotid pulses without delay and no carotid bruits  Chest: clear to auscultation, no signs of consolidation by percussion or palpation, normal fremitus, symmetrical and full respiratory excursions  Cardiovascular: normal position and quality of the apical impulse, regular rhythm, normal first and second heart sounds, no murmurs, rubs or gallops  Abdomen: no tenderness or distention, no masses by palpation, no abnormal  pulsatility or arterial bruits, normal bowel sounds, no hepatosplenomegaly  Extremities: no clubbing, cyanosis or edema; 2+ radial, ulnar and brachial pulses bilaterally; 2+ right femoral, posterior tibial and dorsalis pedis pulses; 2+ left femoral, posterior tibial and dorsalis pedis pulses; no subclavian or femoral bruits  Neurological: grossly nonfocal   EKG: Sinus rhythm, no acute repolarization abnormalities  Lipid Panel     Component Value Date/Time   CHOL 66 03/01/2011 1602   TRIG 37.0 03/01/2011 1602   HDL 24.40* 03/01/2011 1602   CHOLHDL 3 03/01/2011 1602   VLDL 7.4 03/01/2011 1602   LDLCALC 34 03/01/2011 1602    BMET    Component Value Date/Time   NA 137 09/14/2013 0820   K 4.7 09/14/2013 0820   CL 105 09/14/2013 0820   CO2 23 09/14/2013 0820   GLUCOSE 198* 09/14/2013 0820   BUN 19 09/14/2013 0820   CREATININE 0.78 09/14/2013 0820   CREATININE 0.90 07/17/2013 0817   CALCIUM 8.4 09/14/2013 0820   GFRNONAA >90 07/12/2013 1215   GFRAA >90 07/12/2013 1215     ASSESSMENT AND PLAN HYPERTENSION Severely elevated blood pressure readings have no longer been recorded. I'm not sure if this is because he has started to be a little more relaxed about his blood pressure readings or because of the changes in medication. Regardless his blood pressure tends to be  smoother now and continues to be on the average greater than 140/90. He did not have evidence of renal artery stenosis by duplex ultrasonography, but had a good response to Spiriva lactone. He has normal renal function and his potassium is now in the normal range. Blood pressure control is not yet perfect and I have advised that he increase his carvedilol to 37.5 mg twice a day.  CAD, 07/14/12- LAD/PDA DES Asymptomatic.NSTEMI Feb 2011- medical Rx 07/14/2012 2.5 x 50 mm expedition drug-eluting stent the mid LAD and 2.5 x 18 mm expedition drug-eluting stent to the posterior descending artery branch of the circumflex vessel coronary artery   recheck a lipid profile and liver function tests before his upcoming appointment No orders of the defined types were placed in this encounter.   Meds ordered this encounter  Medications  . carvedilol (COREG) 25 MG tablet    Sig: Take 1.5 tablets (37.5 mg total) by mouth 2 (two) times daily. TAKE 1 1/2 TABLETS TWICE A DAY TO EQUAL 37.5MG     Dispense:  270 tablet    Refill:  3    Breccan Galant  Thurmon Fair, MD, [facility] [phone] office [phone] pager

## 2013-09-24 ENCOUNTER — Other Ambulatory Visit: Payer: Self-pay | Admitting: Pharmacist Clinician (PhC)/ Clinical Pharmacy Specialist

## 2013-09-24 NOTE — Telephone Encounter (Signed)
Rx was sent to pharmacy electronically. 

## 2013-10-01 ENCOUNTER — Ambulatory Visit (INDEPENDENT_AMBULATORY_CARE_PROVIDER_SITE_OTHER): Payer: 59 | Admitting: Endocrinology

## 2013-10-01 ENCOUNTER — Encounter: Payer: Self-pay | Admitting: Endocrinology

## 2013-10-01 VITALS — BP 116/82 | HR 97 | Temp 97.9°F | Ht 72.0 in | Wt 193.0 lb

## 2013-10-01 DIAGNOSIS — E108 Type 1 diabetes mellitus with unspecified complications: Secondary | ICD-10-CM

## 2013-10-01 NOTE — Patient Instructions (Addendum)
Please continue lantus, 60 units each morning.  On non-work days, take just 40 units.   on this insulin schedule, it is not safe to miss or delay meals.  It is also not safe to take extra insulin.  Please take just these amounts, no matter what your blood sugar is.   check your blood sugar 2 times a day.  vary the time of day when you check, between before the 3 meals, and at bedtime.  also check if you have symptoms of your blood sugar being too high or too low.  please keep a record of the readings and bring it to your next appointment here.  please call us sooner if you are having low blood sugar episodes.   Please come back for a follow-up appointment in 2 months.

## 2013-10-01 NOTE — Progress Notes (Signed)
Subjective:    Patient ID: David Lane, male    DOB: [birth year], 68 y.o.   MRN: [id number]  HPI Pt returns for f/u of insulin-requiring DM (dx'ed 1988, in the context of an episode of pancreatitis; he has chronic pancreatic insufficiency; he has mild if any neuropathy of the lower extremities; he has associated CAD; his last episode of severe hypoglycemia was in early may, 2014; he says this was probably due to taking more than the prescribed insulin dosage; he has never had DKA; in October of 2014, he was seen in ER twice--once for hypertensive response to mild hypoglycemia, and again for mild hypoglycemia; in December of 2014, he was changed to qd lantus, for further simplification of his insulin schedule).  no cbg record, but states cbg's are well-controlled.   Past Medical History  Diagnosis Date  . Diabetes mellitus     Type II  . Hyperlipemia   . Hypertension   . Pancreatitis   . Anxiety   . Hepatic lesion 02/04/11  . GERD (gastroesophageal reflux disease)   . CAD (coronary artery disease)     last cath by [facility] DR.  Mihai Croitoru showing  some  disease involving LCX and small size of Diag   . NSTEMI (non-ST elevated myocardial infarction) 11/21/2009  . Shortness of breath   . CHF exacerbation, due to diastolic dysfunction 73/71/0626  . Ischemic cardiomyopathy     EF 35-40%  . Liver hemangioma     Past Surgical History  Procedure Laterality Date  . Rectal surgery      cyst  . Cardiac catheterization  2011    minimal disease, medical management  . Coronary angioplasty with stent placement  07/14/2012    successful PCI & stenting of mid LAD & PDA off the dominant CX    History   Social History  . Marital Status: Single    Spouse Name: N/A    Number of Children: 0  . Years of Education: N/A   Occupational History  . [employer]   .  [employer]   Social History Main Topics  . Smoking status: Never Smoker   . Smokeless tobacco: Never Used  . Alcohol Use:  No  . Drug Use: No  . Sexual Activity: No   Other Topics Concern  . Not on file   Social History Narrative  . No narrative on file    Current Outpatient Prescriptions on File Prior to Visit  Medication Sig Dispense Refill  . ACCU-CHEK SOFTCLIX LANCETS lancets Use as instructed  100 each  12  . amLODipine (NORVASC) 5 MG tablet Take 5 mg by mouth at bedtime.       Marland Kitchen aspirin 81 MG tablet Take 81 mg by mouth daily.        . carvedilol (COREG) 25 MG tablet Take 1.5 tablets (37.5 mg total) by mouth 2 (two) times daily. TAKE 1 1/2 TABLETS TWICE A DAY TO EQUAL 37.5MG   270 tablet  3  . furosemide (LASIX) 40 MG tablet TAKE 1 TABLET (40 MG TOTAL) BY MOUTH DAILY.  90 tablet  3  . glucose blood (ONE TOUCH ULTRA TEST) test strip 1 each by Other route 2 (two) times daily. And lancets 2/day 250.01  100 each  12  . glucose blood test strip Use as instructed  100 each  12  . Insulin Glargine (LANTUS SOLOSTAR) 100 UNIT/ML SOPN Inject 60 Units into the skin every morning. And pen needles 1/day  10 pen  PRN  . Insulin Pen Needle (PEN NEEDLES) 31G X 6 MM MISC 1 Device by Does not apply route 2 (two) times daily.  90 each  3  . isosorbide mononitrate (IMDUR) 30 MG 24 hr tablet TAKE 1 TABLET (30 MG TOTAL) BY MOUTH DAILY.  30 tablet  5  . lisinopril (PRINIVIL,ZESTRIL) 40 MG tablet Take 40 mg by mouth daily.      Marland Kitchen lisinopril (PRINIVIL,ZESTRIL) 40 MG tablet Take 1 tablet (40 mg total) by mouth daily.  90 tablet  3  . Multiple Vitamin (MULTIVITAMIN) capsule Take 1 capsule by mouth daily.        . nitroGLYCERIN (NITROSTAT) 0.4 MG SL tablet Place 1 tablet (0.4 mg total) under the tongue every 5 (five) minutes as needed for chest pain.  25 tablet  2  . omeprazole (PRILOSEC) 20 MG capsule TAKE ONE CAPSULE TWICE A DAY  180 capsule  0  . Pancrelipase, Lip-Prot-Amyl, 36000 UNITS CPEP Take 2 capsules by mouth See admin instructions. Take 2 capsules by mouth three times daily with meals and 1 capsule with each snack.  720  capsule  1  . simvastatin (ZOCOR) 40 MG tablet Take 1 tablet (40 mg total) by mouth at bedtime.  90 tablet  3  . spironolactone (ALDACTONE) 25 MG tablet TAKE 1 TABLET (25 MG TOTAL) BY MOUTH DAILY.  30 tablet  5   No current facility-administered medications on file prior to visit.    Allergies  Allergen Reactions  . Lipitor [Atorvastatin]     Muscle weakness   Family History  Problem Relation Age of Onset  . Diabetes Paternal Grandmother   . Diabetes Maternal Grandfather   . Colon cancer Neg Hx    BP 116/82  Pulse 97  Temp(Src) 97.9 F (36.6 C) (Oral)  Ht 6' (1.829 m)  Wt 193 lb (87.544 kg)  BMI 26.17 kg/m2  SpO2 91%  Review of Systems denies hypoglycemia and weight change.    Objective:   Physical Exam VITAL SIGNS:  See vs page GENERAL: no distress Lab Results  Component Value Date   HGBA1C 9.5* 08/27/2013      Assessment & Plan:  DM: This insulin regimen was chosen from multiple options, due to its simplicity. Control appears to be improved on qd insulin.  The benefits of glycemic control must be weighed against the risks of hypoglycemia.   CAD: in this context, he needs to avoid hypoglycemia.

## 2013-10-09 ENCOUNTER — Telehealth: Payer: Self-pay

## 2013-10-09 MED ORDER — SPIRONOLACTONE 25 MG PO TABS: ORAL_TABLET | ORAL | Status: DC

## 2013-10-09 NOTE — Telephone Encounter (Signed)
Refill done for Spironolactone.

## 2013-10-11 ENCOUNTER — Emergency Department (HOSPITAL_COMMUNITY)
Admission: EM | Admit: 2013-10-11 | Discharge: 2013-10-11 | Discharge status: Against Medical Advice | Payer: 59 | Attending: Emergency Medicine | Admitting: Emergency Medicine

## 2013-10-11 ENCOUNTER — Encounter (HOSPITAL_COMMUNITY): Payer: Self-pay | Admitting: Emergency Medicine

## 2013-10-11 DIAGNOSIS — I252 Old myocardial infarction: Secondary | ICD-10-CM | POA: Insufficient documentation

## 2013-10-11 DIAGNOSIS — Z9861 Coronary angioplasty status: Secondary | ICD-10-CM | POA: Insufficient documentation

## 2013-10-11 DIAGNOSIS — E1169 Type 2 diabetes mellitus with other specified complication: Secondary | ICD-10-CM | POA: Insufficient documentation

## 2013-10-11 DIAGNOSIS — I10 Essential (primary) hypertension: Secondary | ICD-10-CM | POA: Insufficient documentation

## 2013-10-11 DIAGNOSIS — I509 Heart failure, unspecified: Secondary | ICD-10-CM | POA: Insufficient documentation

## 2013-10-11 DIAGNOSIS — Z9889 Other specified postprocedural states: Secondary | ICD-10-CM | POA: Insufficient documentation

## 2013-10-11 DIAGNOSIS — I2510 Atherosclerotic heart disease of native coronary artery without angina pectoris: Secondary | ICD-10-CM | POA: Insufficient documentation

## 2013-10-11 LAB — GLUCOSE, CAPILLARY: Glucose-Capillary: 136 mg/dL — ABNORMAL HIGH (ref 70–99)

## 2013-10-11 LAB — CBC
HCT: 34.1 % — ABNORMAL LOW (ref 39.0–52.0)
HEMOGLOBIN: 11.9 g/dL — AB (ref 13.0–17.0)
MCH: 31.5 pg (ref 26.0–34.0)
MCHC: 34.9 g/dL (ref 30.0–36.0)
MCV: 90.2 fL (ref 78.0–100.0)
Platelets: 202 10*3/uL (ref 150–400)
RBC: 3.78 MIL/uL — AB (ref 4.22–5.81)
RDW: 13.1 % (ref 11.5–15.5)
WBC: 8.4 10*3/uL (ref 4.0–10.5)

## 2013-10-11 LAB — COMPREHENSIVE METABOLIC PANEL
ALT: 36 U/L (ref 0–53)
AST: 27 U/L (ref 0–37)
Albumin: 3.6 g/dL (ref 3.5–5.2)
Alkaline Phosphatase: 88 U/L (ref 39–117)
BILIRUBIN TOTAL: 0.5 mg/dL (ref 0.3–1.2)
BUN: 15 mg/dL (ref 6–23)
CHLORIDE: 104 meq/L (ref 96–112)
CO2: 26 meq/L (ref 19–32)
CREATININE: 0.75 mg/dL (ref 0.50–1.35)
Calcium: 8.9 mg/dL (ref 8.4–10.5)
GFR calc Af Amer: >90 mL/min (ref >90)
GLUCOSE: 148 mg/dL — AB (ref 70–99)
Potassium: 4 mEq/L (ref 3.7–5.3)
Sodium: 142 mEq/L (ref 137–147)
Total Protein: 6.8 g/dL (ref 6.0–8.3)

## 2013-10-11 NOTE — ED Notes (Addendum)
Pt reports that he takes insulin in the am. Had epidose today around 10am of tingling, diaphoresis and "feeling funny". Checked cbg and it was 59, took glucose tablets and ate candy, was able to get cbg >70 and reports it then dropped down back to 62. Reports symptoms are normal for him when he gets hypogylcemic and they were relieved after cbg >70. Reports he always gets hypertensive when he becomes hypoglycemic. cbg 136 at triage.

## 2013-10-11 NOTE — ED Notes (Signed)
Pt reported to [facility] states bp was rechecked and normal and is going to follow up at his PCP.

## 2013-10-11 NOTE — ED Notes (Signed)
Pt ambulates to [facility], states due to wait time, he "might leave".

## 2013-10-12 ENCOUNTER — Encounter: Payer: Self-pay | Admitting: Endocrinology

## 2013-10-12 ENCOUNTER — Ambulatory Visit (INDEPENDENT_AMBULATORY_CARE_PROVIDER_SITE_OTHER): Payer: 59 | Admitting: Endocrinology

## 2013-10-12 VITALS — BP 118/70 | HR 86 | Temp 98.1°F | Ht 72.0 in | Wt 197.0 lb

## 2013-10-12 DIAGNOSIS — E119 Type 2 diabetes mellitus without complications: Secondary | ICD-10-CM

## 2013-10-12 MED ORDER — INSULIN GLARGINE 100 UNIT/ML SOLOSTAR PEN: 60.0000 [IU] | PEN_INJECTOR | SUBCUTANEOUS | Status: DC

## 2013-10-12 NOTE — Progress Notes (Signed)
Subjective:    Patient ID: David Lane, male    DOB: 1945-11-12, 68 y.o.   MRN: [id number]  HPI Pt returns for f/u of insulin-requiring DM (dx'ed 1988, in the context of an episode of pancreatitis; he has chronic pancreatic insufficiency; he has mild if any neuropathy of the lower extremities; he has associated CAD; his last episode of severe hypoglycemia was in early may, 2014; he says this was probably due to taking more than the prescribed insulin dosage; he has never had DKA; in October of 2014, he was seen in ER twice--once for hypertensive response to mild hypoglycemia, and again for mild hypoglycemia; in December of 2014, he was changed to qd lantus, for further simplification of his insulin schedule).  Yesterday am, pt had an episode of hypoglycemia.  It happened at 10 am, at work.   Past Medical History  Diagnosis Date  . Diabetes mellitus     Type II  . Hyperlipemia   . Hypertension   . Pancreatitis   . Anxiety   . Hepatic lesion 02/04/11  . GERD (gastroesophageal reflux disease)   . CAD (coronary artery disease)     last cath by [facility] DR.  Mihai Croitoru showing  some  disease involving LCX and small size of Diag   . NSTEMI (non-ST elevated myocardial infarction) 11/21/2009  . Shortness of breath   . CHF exacerbation, due to diastolic dysfunction 38/75/6433  . Ischemic cardiomyopathy     EF 35-40%  . Liver hemangioma     Past Surgical History  Procedure Laterality Date  . Rectal surgery      cyst  . Cardiac catheterization  2011    minimal disease, medical management  . Coronary angioplasty with stent placement  07/14/2012    successful PCI & stenting of mid LAD & PDA off the dominant CX    History   Social History  . Marital Status: Single    Spouse Name: N/A    Number of Children: 0  . Years of Education: N/A   Occupational History  . [employer]   .  [employer]   Social History Main Topics  . Smoking status: Never Smoker   . Smokeless tobacco:  Never Used  . Alcohol Use: No  . Drug Use: No  . Sexual Activity: No   Other Topics Concern  . Not on file   Social History Narrative  . No narrative on file    Current Outpatient Prescriptions on File Prior to Visit  Medication Sig Dispense Refill  . ACCU-CHEK SOFTCLIX LANCETS lancets Use as instructed  100 each  12  . amLODipine (NORVASC) 5 MG tablet Take 5 mg by mouth at bedtime.       Marland Kitchen aspirin 81 MG tablet Take 81 mg by mouth daily.        . carvedilol (COREG) 25 MG tablet Take 37.5 mg by mouth 2 (two) times daily with a meal.      . furosemide (LASIX) 40 MG tablet Take 40 mg by mouth daily.      Marland Kitchen glucose blood (ONE TOUCH ULTRA TEST) test strip 1 each by Other route 2 (two) times daily. And lancets 2/day 250.01  100 each  12  . glucose blood test strip Use as instructed  100 each  12  . Insulin Pen Needle (PEN NEEDLES) 31G X 6 MM MISC 1 Device by Does not apply route 2 (two) times daily.  90 each  3  . isosorbide  mononitrate (IMDUR) 30 MG 24 hr tablet Take 30 mg by mouth daily.      Marland Kitchen lisinopril (PRINIVIL,ZESTRIL) 40 MG tablet Take 40 mg by mouth daily.      . Multiple Vitamin (MULTIVITAMIN) capsule Take 1 capsule by mouth daily.        . nitroGLYCERIN (NITROSTAT) 0.4 MG SL tablet Place 1 tablet (0.4 mg total) under the tongue every 5 (five) minutes as needed for chest pain.  25 tablet  2  . omeprazole (PRILOSEC) 20 MG capsule Take 20 mg by mouth daily.      . Pancrelipase, Lip-Prot-Amyl, 36000 UNITS CPEP Take 2 capsules by mouth See admin instructions. Take 2 capsules by mouth three times daily with meals and 1 capsule with each snack.  720 capsule  1  . simvastatin (ZOCOR) 40 MG tablet Take 1 tablet (40 mg total) by mouth at bedtime.  90 tablet  3  . spironolactone (ALDACTONE) 25 MG tablet Take 25 mg by mouth daily.       No current facility-administered medications on file prior to visit.    Allergies  Allergen Reactions  . Lipitor [Atorvastatin] Other (See Comments)     Muscle weakness    Family History  Problem Relation Age of Onset  . Diabetes Paternal Grandmother   . Diabetes Maternal Grandfather   . Colon cancer Neg Hx     BP 118/70  Pulse 86  Temp(Src) 98.1 F (36.7 C) (Oral)  Ht 6' (1.829 m)  Wt 197 lb (89.359 kg)  BMI 26.71 kg/m2  SpO2 99%  Review of Systems Denies LOC and weight change.      Objective:   Physical Exam VITAL SIGNS:  See vs page GENERAL: no distress     Assessment & Plan:  DM: The pattern of his cbg's indicates he needs some adjustment in his therapy

## 2013-10-12 NOTE — Patient Instructions (Addendum)
Please continue lantus, 40 units each morning.  On non-work days, take 60 units.   on this insulin schedule, it is not safe to miss or delay meals.  It is also not safe to take extra insulin.  Please take just these amounts, no matter what your blood sugar is.   check your blood sugar 2 times a day.  vary the time of day when you check, between before the 3 meals, and at bedtime.  also check if you have symptoms of your blood sugar being too high or too low.  please keep a record of the readings and bring it to your next appointment here.  please call us sooner if you are having low blood sugar episodes.   Please come back for a follow-up appointment in 2 months.

## 2013-10-15 ENCOUNTER — Other Ambulatory Visit: Payer: Self-pay | Admitting: Pharmacist Clinician (PhC)/ Clinical Pharmacy Specialist

## 2013-10-31 ENCOUNTER — Ambulatory Visit: Payer: 59 | Admitting: Internal Medicine

## 2013-11-13 ENCOUNTER — Telehealth: Payer: Self-pay | Admitting: Nurse Practitioner

## 2013-11-13 NOTE — Telephone Encounter (Signed)
Received page from answering service regarding this patient - apparently EMS was at his house this am due to B 216/123 and HR of 95. Glucose was 429 - he rested this afternoon.   Attempted to call the patient back but got no answer.   Burtis Junes, RN, [facility] [address] [place], [place]  [id number] [phone]

## 2013-11-17 ENCOUNTER — Other Ambulatory Visit: Payer: Self-pay | Admitting: Endocrinology

## 2013-11-17 ENCOUNTER — Other Ambulatory Visit: Payer: Self-pay | Admitting: Pharmacist Clinician (PhC)/ Clinical Pharmacy Specialist

## 2013-11-30 ENCOUNTER — Ambulatory Visit (INDEPENDENT_AMBULATORY_CARE_PROVIDER_SITE_OTHER): Payer: 59 | Admitting: Endocrinology

## 2013-11-30 ENCOUNTER — Encounter: Payer: Self-pay | Admitting: Endocrinology

## 2013-11-30 VITALS — BP 140/90 | HR 80 | Temp 98.9°F | Ht 72.0 in | Wt 196.0 lb

## 2013-11-30 DIAGNOSIS — E108 Type 1 diabetes mellitus with unspecified complications: Secondary | ICD-10-CM

## 2013-11-30 NOTE — Patient Instructions (Signed)
blood tests are being requested for you today.  We'll contact you with results. Pending these results, please continue lantus, 40 units each morning.  On non-work days, take 60 units.   on this insulin schedule, it is not safe to miss or delay meals.  It is also not safe to take extra insulin.  Please take just these amounts, no matter what your blood sugar is.   check your blood sugar 2 times a day.  vary the time of day when you check, between before the 3 meals, and at bedtime.  also check if you have symptoms of your blood sugar being too high or too low.  please keep a record of the readings and bring it to your next appointment here.  please call us sooner if you are having low blood sugar episodes.   Please come back for a follow-up appointment in 3 months.

## 2013-11-30 NOTE — Progress Notes (Signed)
Subjective:    Patient ID: David Lane, male    DOB: [birth year], 68 y.o.   MRN: [id number]  HPI Pt returns for f/u of insulin-requiring DM (dx'ed 1988, in the context of an episode of pancreatitis; he has chronic pancreatic insufficiency; he has mild if any neuropathy of the lower extremities; he has associated CAD; his last episode of severe hypoglycemia was in early may, 2014; he says this was probably due to taking more than the prescribed insulin dosage; he has never had DKA; in October of 2014, he was seen in ER twice--once for hypertensive response to mild hypoglycemia, and again for mild hypoglycemia; in December of 2014, he was changed to qd lantus, for further simplification of his insulin schedule).  Since last ov, he has mild hypoglycemia, several times per week.  It can happen any day of the week, however.  It can happen any time of day, but usually in the late afternoon.  He is unable to cite any precip factor for these episodes.   Past Medical History  Diagnosis Date  . Diabetes mellitus     Type II  . Hyperlipemia   . Hypertension   . Pancreatitis   . Anxiety   . Hepatic lesion 02/04/11  . GERD (gastroesophageal reflux disease)   . CAD (coronary artery disease)     last cath by [facility] DR.  Mihai Croitoru showing  some  disease involving LCX and small size of Diag   . NSTEMI (non-ST elevated myocardial infarction) 11/21/2009  . Shortness of breath   . CHF exacerbation, due to diastolic dysfunction 78/29/5621  . Ischemic cardiomyopathy     EF 35-40%  . Liver hemangioma     Past Surgical History  Procedure Laterality Date  . Rectal surgery      cyst  . Cardiac catheterization  2011    minimal disease, medical management  . Coronary angioplasty with stent placement  07/14/2012    successful PCI & stenting of mid LAD & PDA off the dominant CX    History   Social History  . Marital Status: Single    Spouse Name: N/A    Number of Children: 0  . Years of  Education: N/A   Occupational History  . [employer]   .  [employer]   Social History Main Topics  . Smoking status: Never Smoker   . Smokeless tobacco: Never Used  . Alcohol Use: No  . Drug Use: No  . Sexual Activity: No   Other Topics Concern  . Not on file   Social History Narrative  . No narrative on file    Current Outpatient Prescriptions on File Prior to Visit  Medication Sig Dispense Refill  . ACCU-CHEK SOFTCLIX LANCETS lancets Use as instructed  100 each  12  . amLODipine (NORVASC) 5 MG tablet Take 5 mg by mouth at bedtime.       Marland Kitchen amLODipine (NORVASC) 5 MG tablet TAKE 1 TABLET (5 MG TOTAL) BY MOUTH EVERY EVENING.  90 tablet  2  . aspirin 81 MG tablet Take 81 mg by mouth daily.        . carvedilol (COREG) 25 MG tablet Take 37.5 mg by mouth 2 (two) times daily with a meal.      . furosemide (LASIX) 40 MG tablet Take 40 mg by mouth daily.      Marland Kitchen glucose blood (ONE TOUCH ULTRA TEST) test strip 1 each by Other route 2 (two) times daily. And  lancets 2/day 250.01  100 each  12  . glucose blood test strip Use as instructed  100 each  12  . Insulin Glargine (LANTUS SOLOSTAR) 100 UNIT/ML Solostar Pen Inject 60 Units into the skin every morning. And pen needles 1/day  40 pen  PRN  . Insulin Pen Needle (PEN NEEDLES) 31G X 6 MM MISC 1 Device by Does not apply route 2 (two) times daily.  90 each  3  . isosorbide mononitrate (IMDUR) 30 MG 24 hr tablet Take 30 mg by mouth daily.      Marland Kitchen lisinopril (PRINIVIL,ZESTRIL) 20 MG tablet TAKE 1 TABLET (20 MG TOTAL) BY MOUTH 2 (TWO) TIMES DAILY.  180 tablet  2  . lisinopril (PRINIVIL,ZESTRIL) 40 MG tablet Take 40 mg by mouth daily.      . Multiple Vitamin (MULTIVITAMIN) capsule Take 1 capsule by mouth daily.        . nitroGLYCERIN (NITROSTAT) 0.4 MG SL tablet Place 1 tablet (0.4 mg total) under the tongue every 5 (five) minutes as needed for chest pain.  25 tablet  2  . omeprazole (PRILOSEC) 20 MG capsule Take 20 mg by mouth daily.      .  Pancrelipase, Lip-Prot-Amyl, 36000 UNITS CPEP Take 2 capsules by mouth See admin instructions. Take 2 capsules by mouth three times daily with meals and 1 capsule with each snack.  720 capsule  1  . simvastatin (ZOCOR) 40 MG tablet Take 1 tablet (40 mg total) by mouth at bedtime.  90 tablet  3  . spironolactone (ALDACTONE) 25 MG tablet Take 25 mg by mouth daily.      Marland Kitchen spironolactone (ALDACTONE) 25 MG tablet TAKE 1 TABLET (25 MG TOTAL) BY MOUTH DAILY.  90 tablet  0   No current facility-administered medications on file prior to visit.    Allergies  Allergen Reactions  . Lipitor [Atorvastatin] Other (See Comments)    Muscle weakness    Family History  Problem Relation Age of Onset  . Diabetes Paternal Grandmother   . Diabetes Maternal Grandfather   . Colon cancer Neg Hx     BP 140/90  Pulse 80  Temp(Src) 98.9 F (37.2 C) (Oral)  Ht 6' (1.829 m)  Wt 196 lb (88.905 kg)  BMI 26.58 kg/m2  SpO2 99%  Review of Systems Denies LOC and weight change.      Objective:   Physical Exam VITAL SIGNS:  See vs page GENERAL: no distress  Lab Results  Component Value Date   HGBA1C 9.2* 11/30/2013      Assessment & Plan:  DM: This insulin regimen was chosen from multiple options, due to its simplicity. The benefits of glycemic control must be weighed against the risks of hypoglycemia.  The next step is to reduce hypoglycemia.  He will need to do this by eating meals consistently. CAD: in this context, he needs to avoid hypoglycemia.

## 2013-12-01 LAB — HEMOGLOBIN A1C
HEMOGLOBIN A1C: 9.2 % — AB (ref <5.7)
Mean Plasma Glucose: 217 mg/dL — ABNORMAL HIGH (ref <117)

## 2013-12-03 ENCOUNTER — Other Ambulatory Visit: Payer: Self-pay | Admitting: Endocrinology

## 2013-12-03 DIAGNOSIS — E108 Type 1 diabetes mellitus with unspecified complications: Secondary | ICD-10-CM

## 2013-12-05 ENCOUNTER — Telehealth: Payer: Self-pay | Admitting: *Deleted

## 2013-12-05 NOTE — Telephone Encounter (Signed)
Spoke with pt for 20 minutes. Finally suggested he just come in to review his medication as he was having trouble telling me what he was taking and when.  Kerin Ransom PA-C 12/05/2013 4:05 PM

## 2013-12-05 NOTE — Telephone Encounter (Signed)
Spoke to patient. He is very concerned about his blood pressure reading for today.Patient states  6 AM  B/P 177/103 , HE STATES IT IS ALWAYS ELEVATED WHEN he wakes up in the morning. At 7:30  B/P 74/48,82/52;  8 am 103/54;   9 am 71/50  ; 119/63 p 65  He would like someone to call him after 3:45 pm today to discuss his medication . He does not have the list with him at the present time. He would like some advise if he need to change medication .  Spoke with Kerin Ransom  PA  - he will call patient later and patient is aware.

## 2013-12-05 NOTE — Telephone Encounter (Signed)
Pt called and asked if someone could call him back about his BP going really low. He thinks he took too much medication.  [place]

## 2013-12-06 ENCOUNTER — Ambulatory Visit (INDEPENDENT_AMBULATORY_CARE_PROVIDER_SITE_OTHER): Payer: 59 | Admitting: Cardiovascular Disease

## 2013-12-06 ENCOUNTER — Encounter: Payer: Self-pay | Admitting: Cardiovascular Disease

## 2013-12-06 VITALS — BP 132/84 | HR 81 | Resp 18 | Ht 72.0 in | Wt 196.3 lb

## 2013-12-06 DIAGNOSIS — I2510 Atherosclerotic heart disease of native coronary artery without angina pectoris: Secondary | ICD-10-CM

## 2013-12-06 MED ORDER — SIMVASTATIN 40 MG PO TABS: 40.0000 mg | ORAL_TABLET | Freq: Every day | ORAL | Status: DC

## 2013-12-06 NOTE — Patient Instructions (Signed)
Take Lisinopril 40mg  at bedtime.  Keep next scheduled appointment.

## 2013-12-07 ENCOUNTER — Telehealth: Payer: Self-pay | Admitting: Cardiovascular Disease

## 2013-12-07 NOTE — Telephone Encounter (Signed)
Returned call and pt verified x 2.  Pt stated he was seen yesterday w/ Dr. Loletha Grayer and he changed some of this medications from AM to PM.  Pt stated BP was 170/91 at 7am and 92/52 at 9am.  Stated he was at work and felt dizzy and light-headed so he checked his BP and it was 92/52.  Stated he drank some ice cold water (2 styrofoam cups) and is feeling better.    Advice  Increase water intake  If symptoms recur, lie flat w/ feet elevated for at least 30 mins  Dr. Loletha Grayer will be notified for further instructions w/ PM meds  Pt verbalized understanding and agreed w/ plan.  Message forwarded to Dr. Sallyanne Kuster.

## 2013-12-07 NOTE — Telephone Encounter (Signed)
Returned call and informed pt per instructions by MD.  Also advised to continue to increase water intake as dehydration can play a factor in symptoms as well.  Also advised he can call after hours or over the weekend if advice needed.  Pt verbalized understanding and agreed w/ plan.

## 2013-12-07 NOTE — Telephone Encounter (Signed)
Pt saw Dr C yesterday and he changed his medicine around. He told him if he blood pressure dropped to let him know. He is at work now and it is 6/52.

## 2013-12-07 NOTE — Telephone Encounter (Signed)
Let's stick with that same schedule for a few days before making other changes

## 2013-12-09 ENCOUNTER — Encounter: Payer: Self-pay | Admitting: Cardiovascular Disease

## 2013-12-09 NOTE — Progress Notes (Signed)
Patient ID: David Lane, male   DOB: 1946-01-07, 68 y.o.   MRN: [id number]      Reason for office visit CAD, hypertension  Trampis continues to be very preoccupied by the variations in his blood pressure. He has noticed that his blood pressures consistently quite high when he first wakes up in the morning at about 170-200/90-100 mm Hg. within a couple of hours of taking his medication his blood pressure is much better in the normal range. He is currently not symptomatic ?with the blood pressure is.  He has a history of coronary disease and received a stent to the LAD artery and a stent to the posterior descending artery in 2013. He has moderately depressed left ventricular systolic function with an ejection fraction of 35-40% and has previously presented with pulmonary edema secondary to heart failure. A repeat echocardiogram on 08/01/13 showed an improved ejection fraction of 50-55% with mild concentric hypertrophy. He has very brittle diabetes mellitus related to pancreatic insufficiency following acute pancreatitis.    Allergies  Allergen Reactions  . Lipitor [Atorvastatin] Other (See Comments)    Muscle weakness    Current Outpatient Prescriptions  Medication Sig Dispense Refill  . ACCU-CHEK SOFTCLIX LANCETS lancets Use as instructed  100 each  12  . amLODipine (NORVASC) 5 MG tablet Take 5 mg by mouth at bedtime.       Marland Kitchen aspirin 81 MG tablet Take 81 mg by mouth daily.        . carvedilol (COREG) 25 MG tablet Take 37.5 mg by mouth 2 (two) times daily with a meal.      . furosemide (LASIX) 40 MG tablet Take 40 mg by mouth daily.      Marland Kitchen glucose blood (ONE TOUCH ULTRA TEST) test strip 1 each by Other route 2 (two) times daily. And lancets 2/day 250.01  100 each  12  . glucose blood test strip Use as instructed  100 each  12  . Insulin Glargine (LANTUS SOLOSTAR) 100 UNIT/ML Solostar Pen Inject 60 Units into the skin every morning. And pen needles 1/day  40 pen  PRN  . Insulin Pen  Needle (PEN NEEDLES) 31G X 6 MM MISC 1 Device by Does not apply route 2 (two) times daily.  90 each  3  . isosorbide mononitrate (IMDUR) 30 MG 24 hr tablet Take 30 mg by mouth daily.      Marland Kitchen lisinopril (PRINIVIL,ZESTRIL) 40 MG tablet Take 40 mg by mouth daily.      . Multiple Vitamin (MULTIVITAMIN) capsule Take 1 capsule by mouth daily.        . nitroGLYCERIN (NITROSTAT) 0.4 MG SL tablet Place 1 tablet (0.4 mg total) under the tongue every 5 (five) minutes as needed for chest pain.  25 tablet  2  . omeprazole (PRILOSEC) 20 MG capsule Take 20 mg by mouth daily.      . Pancrelipase, Lip-Prot-Amyl, 36000 UNITS CPEP Take 2 capsules by mouth See admin instructions. Take 2 capsules by mouth three times daily with meals and 1 capsule with each snack.  720 capsule  1  . simvastatin (ZOCOR) 40 MG tablet Take 1 tablet (40 mg total) by mouth at bedtime.  90 tablet  3  . spironolactone (ALDACTONE) 25 MG tablet Take 25 mg by mouth daily.       No current facility-administered medications for this visit.    Past Medical History  Diagnosis Date  . Diabetes mellitus     Type II  .  Hyperlipemia   . Hypertension   . Pancreatitis   . Anxiety   . Hepatic lesion 02/04/11  . GERD (gastroesophageal reflux disease)   . CAD (coronary artery disease)     last cath by [facility] DR.  Jeffrie Lofstrom showing  some  disease involving LCX and small size of Diag   . NSTEMI (non-ST elevated myocardial infarction) 11/21/2009  . Shortness of breath   . CHF exacerbation, due to diastolic dysfunction 87/86/7672  . Ischemic cardiomyopathy     EF 35-40%  . Liver hemangioma     Past Surgical History  Procedure Laterality Date  . Rectal surgery      cyst  . Cardiac catheterization  2011    minimal disease, medical management  . Coronary angioplasty with stent placement  07/14/2012    successful PCI & stenting of mid LAD & PDA off the dominant CX    Family History  Problem Relation Age of Onset  . Diabetes Paternal  Grandmother   . Diabetes Maternal Grandfather   . Colon cancer Neg Hx     History   Social History  . Marital Status: Single    Spouse Name: N/A    Number of Children: 0  . Years of Education: N/A   Occupational History  . [employer]   .  [employer]   Social History Main Topics  . Smoking status: Never Smoker   . Smokeless tobacco: Never Used  . Alcohol Use: No  . Drug Use: No  . Sexual Activity: No   Other Topics Concern  . Not on file   Social History Narrative  . No narrative on file    Review of systems: The patient specifically denies any chest pain at rest or with exertion, dyspnea at rest or with exertion, orthopnea, paroxysmal nocturnal dyspnea, syncope, palpitations, focal neurological deficits, intermittent claudication, lower extremity edema, unexplained weight gain, cough, hemoptysis or wheezing.  The patient also denies abdominal pain, nausea, vomiting, dysphagia, diarrhea, constipation, polyuria, polydipsia, dysuria, hematuria, frequency, urgency, abnormal bleeding or bruising, fever, chills, unexpected weight changes, mood swings, change in skin or hair texture, change in voice quality, auditory or visual problems, allergic reactions or rashes, new musculoskeletal complaints other than usual "aches and pains".   PHYSICAL EXAM BP 132/84  Pulse 81  Resp 18  Ht 6' (1.829 m)  Wt 89.041 kg (196 lb 4.8 oz)  BMI 26.62 kg/m2  General: Alert, oriented x3, no distress Head: no evidence of trauma, PERRL, EOMI, no exophtalmos or lid lag, no myxedema, no xanthelasma; normal ears, nose and oropharynx Neck: normal jugular venous pulsations and no hepatojugular reflux; brisk carotid pulses without delay and no carotid bruits Chest: clear to auscultation, no signs of consolidation by percussion or palpation, normal fremitus, symmetrical and full respiratory excursions Cardiovascular: normal position and quality of the apical impulse, regular rhythm, normal first  and second heart sounds, no murmurs, rubs or gallops Abdomen: no tenderness or distention, no masses by palpation, no abnormal pulsatility or arterial bruits, normal bowel sounds, no hepatosplenomegaly Extremities: no clubbing, cyanosis or edema; 2+ radial, ulnar and brachial pulses bilaterally; 2+ right femoral, posterior tibial and dorsalis pedis pulses; 2+ left femoral, posterior tibial and dorsalis pedis pulses; no subclavian or femoral bruits Neurological: grossly nonfocal   EKG: Normal sinus rhythm  Lipid Panel     Component Value Date/Time   CHOL 66 03/01/2011 1602   TRIG 37.0 03/01/2011 1602   HDL 24.40* 03/01/2011 1602   CHOLHDL 3  03/01/2011 1602   VLDL 7.4 03/01/2011 1602   LDLCALC 34 03/01/2011 1602    BMET    Component Value Date/Time   NA 142 10/11/2013 1155   K 4.0 10/11/2013 1155   CL 104 10/11/2013 1155   CO2 26 10/11/2013 1155   GLUCOSE 148* 10/11/2013 1155   BUN 15 10/11/2013 1155   CREATININE 0.75 10/11/2013 1155   CREATININE 0.78 09/14/2013 0820   CALCIUM 8.9 10/11/2013 1155   GFRNONAA >90 10/11/2013 1155   GFRAA >90 10/11/2013 1155     ASSESSMENT AND PLAN  It seems that Mr. Biello's antihypertensive medications are wearing off by the time he wakes up in the morning around 5 AM. Have asked him to take his lisinopril at suppertime around 5 PM. He will keep his blood pressure and send [place] some recordings. I also told him that there is a certain normal circadian variation in blood pressure readings and that typically the highest blood pressure will be around the time he wakes up in the morning.  no symptoms of active coronary disease. His lipid profile on statin therapy was satisfactory when recently checked.  He appears euvolemic and has NYHA functional class I status. He is taking a relatively low dose of daily loop diuretic as well as spironolactone. The latter has proven to be very helpful in controlling his blood pressure.  Orders Placed This Encounter  Procedures  . EKG  12-Lead   Meds ordered this encounter  Medications  . simvastatin (ZOCOR) 40 MG tablet    Sig: Take 1 tablet (40 mg total) by mouth at bedtime.    Dispense:  90 tablet    Refill:  Tyrone Sierria Bruney, MD, [facility] [phone] office [phone] pager

## 2013-12-18 ENCOUNTER — Ambulatory Visit: Payer: 59 | Admitting: Cardiovascular Disease

## 2013-12-21 ENCOUNTER — Telehealth: Payer: Self-pay | Admitting: Cardiovascular Disease

## 2013-12-21 NOTE — Telephone Encounter (Signed)
Called cell phone - mailbox is full. Called home phone left message on answer machine -to call back

## 2013-12-21 NOTE — Telephone Encounter (Signed)
Patient called back . Mr David Lane states he had problem with blood pressure again this morning.  He would like parameters on when and how much to take. Mr David Lane states this his morning bp at 6 am was 136/82 ,so he took medication. 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 He checked his bp --88/42  Recheck @15  min later increase 104/49 and then 107/55. Patient states he took his morning break -ate and drank some cold water -b/p 113/65 ; last time 123/66 - He states he had some sick days- so he has taken the rest of the day off to relax.  RN informed patient ,will defer to Dr C.for parameters and someone will contact him back today or Monday.

## 2013-12-21 NOTE — Telephone Encounter (Signed)
Having problems with BP.  Started this morn.  BP went real low and was seeing spots.  Please call

## 2013-12-24 NOTE — Telephone Encounter (Signed)
Reduce the AM dose of carvedilol to 25 mg. Keep taking 37.5 mg in the evening

## 2013-12-25 ENCOUNTER — Encounter: Payer: 59 | Attending: Endocrinology | Admitting: Nutrition

## 2013-12-25 DIAGNOSIS — R7309 Other abnormal glucose: Secondary | ICD-10-CM | POA: Insufficient documentation

## 2013-12-25 DIAGNOSIS — Z713 Dietary counseling and surveillance: Secondary | ICD-10-CM | POA: Insufficient documentation

## 2013-12-25 DIAGNOSIS — E108 Type 1 diabetes mellitus with unspecified complications: Secondary | ICD-10-CM

## 2013-12-25 NOTE — Progress Notes (Signed)
Log book of blood sugar readings show that FBSs are 250-342, ac L: 250-300, acS: all over 250, and HS: never tests.   Pt. Says that he normally will take 40 units of Lantus in the AM (6AM), unless his blood sugar is less than 250.  If he does take Lantus, at that time, his blood sugar drops to 50 mid afternoon if active.  He takes 60u of Lantus on weekends.  Last weekend he had a low blood sugar after walking around with his girlfriend all day.    Meals are balanced at breakfast:  2 eggs, 1-2 pieces of toast with low sugar jelly.  Drinks water with all meals.   Has snack of nabs, or banana at 9AM, and eats lunch at 11AM.  Lunch is either a salad, with no carbs, or hamburger with no fries.  I am guessing he drops low on days when he has a salad for lunch and is active afterwards.  Supper is his big meal, and he eats nothing after supper.    Discussed the need for a balanced meal of protein and carbs--but in moderation.  Also discussed a V-go and how it works.  He was given information on this and will read it over and return in 2 weeks to try this--when Dr. Loanne Drilling is back from vacation.

## 2013-12-25 NOTE — Patient Instructions (Signed)
Read over information on V-Go and look on line for information listed on brochure.   Call if questions.

## 2013-12-25 NOTE — Telephone Encounter (Signed)
Spoke to patient.  RN informed patient  of Dr Sallyanne Kuster discussion .  Patient states he has had another episode today  -- BLOOD PRESSURE THIS MORNING WHEN HE WOKE UP  180/110  3 hours later blood pressure 101 /58-59 ,he felt dizzy that is why he took blood pressure.  Patient  vrebalized understanding . Will continue to monitor.

## 2013-12-26 ENCOUNTER — Telehealth: Payer: Self-pay

## 2013-12-26 ENCOUNTER — Other Ambulatory Visit: Payer: Self-pay | Admitting: Endocrinology

## 2013-12-26 MED ORDER — INSULIN ASPART 100 UNIT/ML ~~LOC~~ SOLN: SUBCUTANEOUS | Status: DC

## 2013-12-26 MED ORDER — INSULIN LISPRO 100 UNIT/ML ~~LOC~~ SOLN: SUBCUTANEOUS | Status: DC

## 2013-12-26 MED ORDER — V-GO 20 KIT: 1.0000 | PACK | Freq: Every day | Status: DC

## 2013-12-26 NOTE — Telephone Encounter (Signed)
Changed to Novolog.

## 2013-12-26 NOTE — Telephone Encounter (Signed)
Either is ok.

## 2013-12-26 NOTE — Telephone Encounter (Signed)
Received fax from pharmacy stating that the Humalog is not covered by insurance, but Novolog is.  Ok to change? Thanks!

## 2014-01-02 ENCOUNTER — Telehealth: Payer: Self-pay | Admitting: Internal Medicine

## 2014-01-02 NOTE — Telephone Encounter (Signed)
Message copied by Oliva Bustard on Wed Jan 02, 2014  8:23 AM ------      Message from: Larina Bras      Created: Wed Oct 31, 2013  1:27 PM                   ----- Message -----         From: Lafayette Dragon, MD         Sent: 10/31/2013  12:33 PM           To: Larina Bras, CMA            Please charge no show fee.      ----- Message -----         From: Larina Bras, CMA         Sent: 10/31/2013  10:59 AM           To: Lafayette Dragon, MD            Patient no showed appointment with Dr Olevia Perches on 10/31/13. Dr Olevia Perches, do you want to charge no show fee?       ------

## 2014-01-08 ENCOUNTER — Ambulatory Visit: Payer: 59 | Admitting: Endocrinology

## 2014-01-08 ENCOUNTER — Encounter (HOSPITAL_BASED_OUTPATIENT_CLINIC_OR_DEPARTMENT_OTHER): Payer: Self-pay | Admitting: Emergency Medicine

## 2014-01-08 ENCOUNTER — Emergency Department (HOSPITAL_BASED_OUTPATIENT_CLINIC_OR_DEPARTMENT_OTHER)
Admission: EM | Admit: 2014-01-08 | Discharge: 2014-01-08 | Disposition: A | Discharge status: Home / Self Care | Payer: 59 | Source: Home / Self Care | Attending: Emergency Medicine | Admitting: Emergency Medicine

## 2014-01-08 ENCOUNTER — Ambulatory Visit: Payer: 59 | Admitting: Nutrition

## 2014-01-08 DIAGNOSIS — I252 Old myocardial infarction: Secondary | ICD-10-CM | POA: Insufficient documentation

## 2014-01-08 DIAGNOSIS — E785 Hyperlipidemia, unspecified: Secondary | ICD-10-CM

## 2014-01-08 DIAGNOSIS — I10 Essential (primary) hypertension: Secondary | ICD-10-CM | POA: Insufficient documentation

## 2014-01-08 DIAGNOSIS — E119 Type 2 diabetes mellitus without complications: Secondary | ICD-10-CM

## 2014-01-08 DIAGNOSIS — J029 Acute pharyngitis, unspecified: Secondary | ICD-10-CM

## 2014-01-08 DIAGNOSIS — Z794 Long term (current) use of insulin: Secondary | ICD-10-CM | POA: Insufficient documentation

## 2014-01-08 DIAGNOSIS — Z8659 Personal history of other mental and behavioral disorders: Secondary | ICD-10-CM | POA: Insufficient documentation

## 2014-01-08 DIAGNOSIS — Z7982 Long term (current) use of aspirin: Secondary | ICD-10-CM

## 2014-01-08 DIAGNOSIS — Z79899 Other long term (current) drug therapy: Secondary | ICD-10-CM

## 2014-01-08 DIAGNOSIS — I509 Heart failure, unspecified: Secondary | ICD-10-CM | POA: Insufficient documentation

## 2014-01-08 DIAGNOSIS — Z9889 Other specified postprocedural states: Secondary | ICD-10-CM

## 2014-01-08 DIAGNOSIS — I2510 Atherosclerotic heart disease of native coronary artery without angina pectoris: Secondary | ICD-10-CM | POA: Insufficient documentation

## 2014-01-08 DIAGNOSIS — K219 Gastro-esophageal reflux disease without esophagitis: Secondary | ICD-10-CM | POA: Insufficient documentation

## 2014-01-08 DIAGNOSIS — Z9861 Coronary angioplasty status: Secondary | ICD-10-CM

## 2014-01-08 LAB — BASIC METABOLIC PANEL
BUN: 16 mg/dL (ref 6–23)
CALCIUM: 9.3 mg/dL (ref 8.4–10.5)
CO2: 22 meq/L (ref 19–32)
CREATININE: 0.9 mg/dL (ref 0.50–1.35)
Chloride: 99 mEq/L (ref 96–112)
GFR calc non Af Amer: 87 mL/min — ABNORMAL LOW (ref >90)
Glucose, Bld: 300 mg/dL — ABNORMAL HIGH (ref 70–99)
Potassium: 3.8 mEq/L (ref 3.7–5.3)
SODIUM: 137 meq/L (ref 137–147)

## 2014-01-08 LAB — CBC WITH DIFFERENTIAL/PLATELET
BASOS ABS: 0 10*3/uL (ref 0.0–0.1)
BASOS PCT: 0 % (ref 0–1)
Eosinophils Absolute: 0 10*3/uL (ref 0.0–0.7)
Eosinophils Relative: 0 % (ref 0–5)
HEMATOCRIT: 37 % — AB (ref 39.0–52.0)
Hemoglobin: 13.2 g/dL (ref 13.0–17.0)
LYMPHS PCT: 6 % — AB (ref 12–46)
Lymphs Abs: 1.3 10*3/uL (ref 0.7–4.0)
MCH: 32 pg (ref 26.0–34.0)
MCHC: 35.7 g/dL (ref 30.0–36.0)
MCV: 89.8 fL (ref 78.0–100.0)
MONO ABS: 2.1 10*3/uL — AB (ref 0.1–1.0)
Monocytes Relative: 10 % (ref 3–12)
Neutro Abs: 18.7 10*3/uL — ABNORMAL HIGH (ref 1.7–7.7)
Neutrophils Relative %: 85 % — ABNORMAL HIGH (ref 43–77)
PLATELETS: 207 10*3/uL (ref 150–400)
RBC: 4.12 MIL/uL — ABNORMAL LOW (ref 4.22–5.81)
RDW: 12.4 % (ref 11.5–15.5)
WBC: 22.1 10*3/uL — AB (ref 4.0–10.5)

## 2014-01-08 LAB — RAPID STREP SCREEN (MED CTR MEBANE ONLY): STREPTOCOCCUS, GROUP A SCREEN (DIRECT): NEGATIVE

## 2014-01-08 MED ORDER — DEXTROSE 5 % IV SOLN
1.0000 g | Freq: Once | INTRAVENOUS | Status: AC
  Administered 2014-01-08: 1 g via INTRAVENOUS

## 2014-01-08 MED ORDER — HYDROCODONE-HOMATROPINE 5-1.5 MG/5ML PO SYRP: 5.0000 mL | ORAL_SOLUTION | Freq: Four times a day (QID) | ORAL | Status: DC | PRN

## 2014-01-08 MED ORDER — PENICILLIN V POTASSIUM 500 MG PO TABS: 500.0000 mg | ORAL_TABLET | Freq: Four times a day (QID) | ORAL | Status: DC

## 2014-01-08 MED ORDER — ACETAMINOPHEN 325 MG PO TABS
650.0000 mg | ORAL_TABLET | Freq: Once | ORAL | Status: AC
  Administered 2014-01-08: 650 mg via ORAL
  Filled 2014-01-08: qty 2

## 2014-01-08 MED ORDER — CEFTRIAXONE SODIUM 1 G IJ SOLR
INTRAMUSCULAR | Status: AC
  Filled 2014-01-08: qty 10

## 2014-01-08 MED ORDER — FAMOTIDINE IN NACL 20-0.9 MG/50ML-% IV SOLN
20.0000 mg | Freq: Once | INTRAVENOUS | Status: AC
  Administered 2014-01-08: 20 mg via INTRAVENOUS
  Filled 2014-01-08: qty 50

## 2014-01-08 MED ORDER — KETOROLAC TROMETHAMINE 30 MG/ML IJ SOLN
30.0000 mg | Freq: Once | INTRAMUSCULAR | Status: AC
  Administered 2014-01-08: 30 mg via INTRAVENOUS
  Filled 2014-01-08: qty 1

## 2014-01-08 MED ORDER — SODIUM CHLORIDE 0.9 % IV BOLUS (SEPSIS)
1000.0000 mL | Freq: Once | INTRAVENOUS | Status: AC
  Administered 2014-01-08: 1000 mL via INTRAVENOUS

## 2014-01-08 NOTE — Discharge Instructions (Signed)
Sore Throat A sore throat is pain, burning, irritation, or scratchiness of the throat. There is often pain or tenderness when swallowing or talking. A sore throat may be accompanied by other symptoms, such as coughing, sneezing, fever, and swollen neck glands. A sore throat is often the first sign of another sickness, such as a cold, flu, strep throat, or mononucleosis (commonly known as mono). Most sore throats go away without medical treatment. CAUSES  The most common causes of a sore throat include:  A viral infection, such as a cold, flu, or mono.  A bacterial infection, such as strep throat, tonsillitis, or whooping cough.  Seasonal allergies.  Dryness in the air.  Irritants, such as smoke or pollution.  Gastroesophageal reflux disease (GERD). HOME CARE INSTRUCTIONS   Only take over-the-counter medicines as directed by your caregiver.  Drink enough fluids to keep your urine clear or pale yellow.  Rest as needed.  Try using throat sprays, lozenges, or sucking on hard candy to ease any pain (if older than 4 years or as directed).  Sip warm liquids, such as broth, herbal tea, or warm water with honey to relieve pain temporarily. You may also eat or drink cold or frozen liquids such as frozen ice pops.  Gargle with salt water (mix 1 tsp salt with 8 oz of water).  Do not smoke and avoid secondhand smoke.  Put a cool-mist humidifier in your bedroom at night to moisten the air. You can also turn on a hot shower and sit in the bathroom with the door closed for 5 10 minutes. SEEK IMMEDIATE MEDICAL CARE IF:  You have difficulty breathing.  You are unable to swallow fluids, soft foods, or your saliva.  You have increased swelling in the throat.  Your sore throat does not get better in 7 days.  You have nausea and vomiting.  You have a fever or persistent symptoms for more than 2 3 days.  You have a fever and your symptoms suddenly get worse. MAKE SURE YOU:   Understand  these instructions.  Will watch your condition.  Will get help right away if you are not doing well or get worse. Document Released: 10/21/2004 Document Revised: 08/30/2012 Document Reviewed: 05/21/2012 [employer] Patient Information 2014 [employer].  

## 2014-01-08 NOTE — ED Provider Notes (Signed)
Medical screening examination/treatment/procedure(s) were performed by non-physician practitioner and as supervising physician I was immediately available for consultation/collaboration.   EKG Interpretation   Date/Time:  Tuesday January 08 2014 13:42:55 EDT Ventricular Rate:  99 PR Interval:  196 QRS Duration: 96 QT Interval:  344 QTC Calculation: 441 R Axis:   61 Text Interpretation:  Normal sinus rhythm Possible Left atrial enlargement  Septal infarct , age undetermined non-specific t wave changes in V3  Confirmed by Zyra Parrillo  MD, Cheyan Frees ([id number]) on 01/08/2014 3:10:25 PM        Shea Evans Ruthe Mannan, MD 01/08/14 2151

## 2014-01-08 NOTE — ED Provider Notes (Signed)
CSN: [id number]     Arrival date & time 01/08/14  1324 History   None    No chief complaint on file.    (Consider location/radiation/quality/duration/timing/severity/associated sxs/prior Treatment) Patient is a 68 y.o. male presenting with pharyngitis. The history is provided by the patient. No language interpreter was used.  Sore Throat This is a new problem. The current episode started yesterday. The problem occurs constantly. The problem has been gradually worsening. Associated symptoms include a sore throat. Nothing aggravates the symptoms. He has tried nothing for the symptoms. The treatment provided no relief.  Pt complains of a sorethroat.   Pt reports hurts to swallow.   Pt has been having some reflux as well.   Pt has had some yellow sinus drainage  Past Medical History  Diagnosis Date  . Diabetes mellitus     Type II  . Hyperlipemia   . Hypertension   . Pancreatitis   . Anxiety   . Hepatic lesion 02/04/11  . GERD (gastroesophageal reflux disease)   . CAD (coronary artery disease)     last cath by [facility] DR.  Mihai Croitoru showing  some  disease involving LCX and small size of Diag   . NSTEMI (non-ST elevated myocardial infarction) 11/21/2009  . Shortness of breath   . CHF exacerbation, due to diastolic dysfunction 19/41/7408  . Ischemic cardiomyopathy     EF 35-40%  . Liver hemangioma    Past Surgical History  Procedure Laterality Date  . Rectal surgery      cyst  . Cardiac catheterization  2011    minimal disease, medical management  . Coronary angioplasty with stent placement  07/14/2012    successful PCI & stenting of mid LAD & PDA off the dominant CX   Family History  Problem Relation Age of Onset  . Diabetes Paternal Grandmother   . Diabetes Maternal Grandfather   . Colon cancer Neg Hx    History  Substance Use Topics  . Smoking status: Never Smoker   . Smokeless tobacco: Never Used  . Alcohol Use: No    Review of Systems  HENT: Positive for sore  throat.   All other systems reviewed and are negative.     Allergies  Lipitor  Home Medications   Prior to Admission medications   Medication Sig Start Date End Date Taking? Authorizing Provider  ACCU-CHEK SOFTCLIX LANCETS lancets Use as instructed 08/17/13   Renato Shin, MD  amLODipine (NORVASC) 5 MG tablet Take 5 mg by mouth at bedtime.  02/09/13   Historical Provider, MD  aspirin 81 MG tablet Take 81 mg by mouth daily.      Historical Provider, MD  carvedilol (COREG) 25 MG tablet Take 37.5 mg by mouth 2 (two) times daily with a meal.    Historical Provider, MD  furosemide (LASIX) 40 MG tablet Take 40 mg by mouth daily.    Historical Provider, MD  glucose blood (ONE TOUCH ULTRA TEST) test strip 1 each by Other route 2 (two) times daily. And lancets 2/day 250.01 08/27/13   Renato Shin, MD  glucose blood test strip Use as instructed 12/27/12   Renato Shin, MD  insulin aspart (NOVOLOG) 100 UNIT/ML injection For use in pump. Total of 50 units per day. 12/26/13   Renato Shin, MD  Insulin Disposable Pump (V-GO 20) KIT 1 Device by Does not apply route daily. 12/26/13   Renato Shin, MD  Insulin Pen Needle (PEN NEEDLES) 31G X 6 MM MISC 1 Device by Does  not apply route 2 (two) times daily. 08/17/13   Renato Shin, MD  isosorbide mononitrate (IMDUR) 30 MG 24 hr tablet Take 30 mg by mouth daily.    Historical Provider, MD  lisinopril (PRINIVIL,ZESTRIL) 40 MG tablet Take 40 mg by mouth daily.    Historical Provider, MD  Multiple Vitamin (MULTIVITAMIN) capsule Take 1 capsule by mouth daily.      Historical Provider, MD  nitroGLYCERIN (NITROSTAT) 0.4 MG SL tablet Place 1 tablet (0.4 mg total) under the tongue every 5 (five) minutes as needed for chest pain. 07/19/12   Erlene Quan, PA-C  omeprazole (PRILOSEC) 20 MG capsule Take 20 mg by mouth daily.    Historical Provider, MD  Pancrelipase, Lip-Prot-Amyl, 36000 UNITS CPEP Take 2 capsules by mouth See admin instructions. Take 2 capsules by mouth three  times daily with meals and 1 capsule with each snack. 09/14/13   Lafayette Dragon, MD  simvastatin (ZOCOR) 40 MG tablet Take 1 tablet (40 mg total) by mouth at bedtime. 12/06/13   Mihai Croitoru, MD  spironolactone (ALDACTONE) 25 MG tablet Take 25 mg by mouth daily.    Historical Provider, MD   There were no vitals taken for this visit. Physical Exam  Constitutional: He is oriented to person, place, and time. He appears well-developed and well-nourished.  HENT:  Head: Normocephalic.  Right Ear: External ear normal.  Left Ear: External ear normal.  Eyes: Conjunctivae are normal. Pupils are equal, round, and reactive to light.  Neck: Normal range of motion. Neck supple.  Cardiovascular: Normal rate, regular rhythm and normal heart sounds.   Pulmonary/Chest: Effort normal and breath sounds normal.  Abdominal: Soft.  Musculoskeletal: Normal range of motion.  Neurological: He is alert and oriented to person, place, and time. He has normal reflexes.  Skin: Skin is warm.  Psychiatric: He has a normal mood and affect.    ED Course  Procedures (including critical care time) Labs Review Labs Reviewed - No data to display  Imaging Review No results found.   EKG Interpretation None      MDM   Final diagnoses:  Pharyngitis    IV ns x 1 liter,   Rocephin IV. torodol for pain.   Pt given pepcid due to recent increase in reflux/        Fransico Meadow, PA-C 01/08/14 1702

## 2014-01-08 NOTE — ED Notes (Signed)
Pt amb to room 5 with quick steady gait in nad. Pt reports sore throat, "like knives in my throat" x yesterday. Pt states others at work have been ill with sore throat also.

## 2014-01-09 ENCOUNTER — Encounter (HOSPITAL_BASED_OUTPATIENT_CLINIC_OR_DEPARTMENT_OTHER): Payer: Self-pay | Admitting: Emergency Medicine

## 2014-01-09 ENCOUNTER — Emergency Department (HOSPITAL_BASED_OUTPATIENT_CLINIC_OR_DEPARTMENT_OTHER): Payer: 59

## 2014-01-09 ENCOUNTER — Inpatient Hospital Stay (HOSPITAL_BASED_OUTPATIENT_CLINIC_OR_DEPARTMENT_OTHER)
Admission: EM | Admit: 2014-01-09 | Discharge: 2014-01-15 | DRG: 153 | Disposition: A | Discharge status: Home Health | Payer: 59 | Attending: Internal Medicine | Admitting: Internal Medicine

## 2014-01-09 DIAGNOSIS — E785 Hyperlipidemia, unspecified: Secondary | ICD-10-CM | POA: Diagnosis present

## 2014-01-09 DIAGNOSIS — F411 Generalized anxiety disorder: Secondary | ICD-10-CM | POA: Diagnosis present

## 2014-01-09 DIAGNOSIS — Z833 Family history of diabetes mellitus: Secondary | ICD-10-CM

## 2014-01-09 DIAGNOSIS — J390 Retropharyngeal and parapharyngeal abscess: Principal | ICD-10-CM | POA: Diagnosis present

## 2014-01-09 DIAGNOSIS — I509 Heart failure, unspecified: Secondary | ICD-10-CM | POA: Diagnosis present

## 2014-01-09 DIAGNOSIS — I252 Old myocardial infarction: Secondary | ICD-10-CM

## 2014-01-09 DIAGNOSIS — Z9641 Presence of insulin pump (external) (internal): Secondary | ICD-10-CM

## 2014-01-09 DIAGNOSIS — I160 Hypertensive urgency: Secondary | ICD-10-CM

## 2014-01-09 DIAGNOSIS — R1310 Dysphagia, unspecified: Secondary | ICD-10-CM

## 2014-01-09 DIAGNOSIS — I10 Essential (primary) hypertension: Secondary | ICD-10-CM | POA: Diagnosis present

## 2014-01-09 DIAGNOSIS — I255 Ischemic cardiomyopathy: Secondary | ICD-10-CM | POA: Diagnosis present

## 2014-01-09 DIAGNOSIS — I5042 Chronic combined systolic (congestive) and diastolic (congestive) heart failure: Secondary | ICD-10-CM | POA: Diagnosis present

## 2014-01-09 DIAGNOSIS — E119 Type 2 diabetes mellitus without complications: Secondary | ICD-10-CM | POA: Diagnosis present

## 2014-01-09 DIAGNOSIS — K219 Gastro-esophageal reflux disease without esophagitis: Secondary | ICD-10-CM | POA: Diagnosis present

## 2014-01-09 DIAGNOSIS — Z9861 Coronary angioplasty status: Secondary | ICD-10-CM

## 2014-01-09 DIAGNOSIS — J029 Acute pharyngitis, unspecified: Secondary | ICD-10-CM | POA: Diagnosis present

## 2014-01-09 DIAGNOSIS — E876 Hypokalemia: Secondary | ICD-10-CM | POA: Diagnosis present

## 2014-01-09 DIAGNOSIS — Z794 Long term (current) use of insulin: Secondary | ICD-10-CM

## 2014-01-09 DIAGNOSIS — J811 Chronic pulmonary edema: Secondary | ICD-10-CM

## 2014-01-09 DIAGNOSIS — E108 Type 1 diabetes mellitus with unspecified complications: Secondary | ICD-10-CM

## 2014-01-09 DIAGNOSIS — Z8719 Personal history of other diseases of the digestive system: Secondary | ICD-10-CM

## 2014-01-09 DIAGNOSIS — I2510 Atherosclerotic heart disease of native coronary artery without angina pectoris: Secondary | ICD-10-CM | POA: Diagnosis present

## 2014-01-09 DIAGNOSIS — J387 Other diseases of larynx: Secondary | ICD-10-CM | POA: Diagnosis present

## 2014-01-09 DIAGNOSIS — F419 Anxiety disorder, unspecified: Secondary | ICD-10-CM | POA: Diagnosis present

## 2014-01-09 DIAGNOSIS — D72829 Elevated white blood cell count, unspecified: Secondary | ICD-10-CM

## 2014-01-09 DIAGNOSIS — I2589 Other forms of chronic ischemic heart disease: Secondary | ICD-10-CM | POA: Diagnosis present

## 2014-01-09 DIAGNOSIS — Z888 Allergy status to other drugs, medicaments and biological substances status: Secondary | ICD-10-CM

## 2014-01-09 DIAGNOSIS — R0603 Acute respiratory distress: Secondary | ICD-10-CM

## 2014-01-09 DIAGNOSIS — I5021 Acute systolic (congestive) heart failure: Secondary | ICD-10-CM

## 2014-01-09 DIAGNOSIS — K8689 Other specified diseases of pancreas: Secondary | ICD-10-CM | POA: Diagnosis present

## 2014-01-09 HISTORY — DX: Type 2 diabetes mellitus without complications: E11.9

## 2014-01-09 LAB — CBC WITH DIFFERENTIAL/PLATELET
BASOS ABS: 0 10*3/uL (ref 0.0–0.1)
Basophils Relative: 0 % (ref 0–1)
Eosinophils Absolute: 0 10*3/uL (ref 0.0–0.7)
Eosinophils Relative: 0 % (ref 0–5)
HCT: 34.4 % — ABNORMAL LOW (ref 39.0–52.0)
Hemoglobin: 12 g/dL — ABNORMAL LOW (ref 13.0–17.0)
LYMPHS PCT: 7 % — AB (ref 12–46)
Lymphs Abs: 1.3 10*3/uL (ref 0.7–4.0)
MCH: 31.6 pg (ref 26.0–34.0)
MCHC: 34.9 g/dL (ref 30.0–36.0)
MCV: 90.5 fL (ref 78.0–100.0)
Monocytes Absolute: 2 10*3/uL — ABNORMAL HIGH (ref 0.1–1.0)
Monocytes Relative: 10 % (ref 3–12)
NEUTROS ABS: 16.1 10*3/uL — AB (ref 1.7–7.7)
Neutrophils Relative %: 83 % — ABNORMAL HIGH (ref 43–77)
Platelets: 185 10*3/uL (ref 150–400)
RBC: 3.8 MIL/uL — ABNORMAL LOW (ref 4.22–5.81)
RDW: 12.7 % (ref 11.5–15.5)
WBC: 19.4 10*3/uL — AB (ref 4.0–10.5)

## 2014-01-09 LAB — BASIC METABOLIC PANEL
BUN: 19 mg/dL (ref 6–23)
CO2: 24 mEq/L (ref 19–32)
Calcium: 8.8 mg/dL (ref 8.4–10.5)
Chloride: 102 mEq/L (ref 96–112)
Creatinine, Ser: 0.9 mg/dL (ref 0.50–1.35)
GFR calc non Af Amer: 87 mL/min — ABNORMAL LOW (ref >90)
Glucose, Bld: 255 mg/dL — ABNORMAL HIGH (ref 70–99)
Potassium: 3.8 mEq/L (ref 3.7–5.3)
Sodium: 139 mEq/L (ref 137–147)

## 2014-01-09 LAB — MRSA PCR SCREENING: MRSA BY PCR: NEGATIVE

## 2014-01-09 LAB — GLUCOSE, CAPILLARY
GLUCOSE-CAPILLARY: 174 mg/dL — AB (ref 70–99)
Glucose-Capillary: 220 mg/dL — ABNORMAL HIGH (ref 70–99)

## 2014-01-09 LAB — CBG MONITORING, ED: Glucose-Capillary: 241 mg/dL — ABNORMAL HIGH (ref 70–99)

## 2014-01-09 MED ORDER — ENOXAPARIN SODIUM 40 MG/0.4ML ~~LOC~~ SOLN
40.0000 mg | SUBCUTANEOUS | Status: DC
  Administered 2014-01-10 – 2014-01-14 (×5): 40 mg via SUBCUTANEOUS
  Filled 2014-01-09 (×7): qty 0.4

## 2014-01-09 MED ORDER — SODIUM CHLORIDE 0.9 % IV SOLN
3.0000 g | Freq: Once | INTRAVENOUS | Status: DC
  Filled 2014-01-09: qty 3

## 2014-01-09 MED ORDER — PANCRELIPASE (LIP-PROT-AMYL) 12000-38000 UNITS PO CPEP
1.0000 | ORAL_CAPSULE | ORAL | Status: DC | PRN
  Filled 2014-01-09: qty 1

## 2014-01-09 MED ORDER — ACETAMINOPHEN 325 MG PO TABS: 650.0000 mg | ORAL_TABLET | Freq: Four times a day (QID) | ORAL | Status: DC | PRN

## 2014-01-09 MED ORDER — SODIUM CHLORIDE 0.9 % IJ SOLN
3.0000 mL | Freq: Two times a day (BID) | INTRAMUSCULAR | Status: DC
  Administered 2014-01-10 (×2): 3 mL via INTRAVENOUS

## 2014-01-09 MED ORDER — PANTOPRAZOLE SODIUM 40 MG PO TBEC
40.0000 mg | DELAYED_RELEASE_TABLET | Freq: Every day | ORAL | Status: DC
  Filled 2014-01-09: qty 1

## 2014-01-09 MED ORDER — INSULIN ASPART 100 UNIT/ML ~~LOC~~ SOLN
0.0000 [IU] | Freq: Three times a day (TID) | SUBCUTANEOUS | Status: DC
Start: 2014-01-09 — End: 2014-01-10
  Administered 2014-01-09: 3 [IU] via SUBCUTANEOUS

## 2014-01-09 MED ORDER — ONDANSETRON HCL 4 MG/2ML IJ SOLN: 4.0000 mg | Freq: Four times a day (QID) | INTRAMUSCULAR | Status: DC | PRN

## 2014-01-09 MED ORDER — ISOSORBIDE MONONITRATE ER 30 MG PO TB24
30.0000 mg | ORAL_TABLET | Freq: Every day | ORAL | Status: DC
  Filled 2014-01-09 (×3): qty 1

## 2014-01-09 MED ORDER — ACETAMINOPHEN 650 MG RE SUPP: 650.0000 mg | Freq: Four times a day (QID) | RECTAL | Status: DC | PRN

## 2014-01-09 MED ORDER — SODIUM CHLORIDE 0.9 % IJ SOLN: 3.0000 mL | INTRAMUSCULAR | Status: DC | PRN

## 2014-01-09 MED ORDER — IOHEXOL 300 MG/ML  SOLN
75.0000 mL | Freq: Once | INTRAMUSCULAR | Status: AC | PRN
  Administered 2014-01-09: 75 mL via INTRAVENOUS

## 2014-01-09 MED ORDER — CLINDAMYCIN PHOSPHATE 600 MG/50ML IV SOLN
600.0000 mg | Freq: Four times a day (QID) | INTRAVENOUS | Status: DC
  Administered 2014-01-09 – 2014-01-13 (×16): 600 mg via INTRAVENOUS
  Filled 2014-01-09 (×19): qty 50

## 2014-01-09 MED ORDER — SODIUM CHLORIDE 0.9 % IV BOLUS (SEPSIS)
250.0000 mL | Freq: Once | INTRAVENOUS | Status: AC
  Administered 2014-01-09: 250 mL via INTRAVENOUS

## 2014-01-09 MED ORDER — HYDROCODONE-ACETAMINOPHEN 5-325 MG PO TABS: 1.0000 | ORAL_TABLET | ORAL | Status: DC | PRN

## 2014-01-09 MED ORDER — PANCRELIPASE (LIP-PROT-AMYL) 12000-38000 UNITS PO CPEP
2.0000 | ORAL_CAPSULE | Freq: Three times a day (TID) | ORAL | Status: DC
  Filled 2014-01-09 (×7): qty 2

## 2014-01-09 MED ORDER — MORPHINE SULFATE 2 MG/ML IJ SOLN
1.0000 mg | INTRAMUSCULAR | Status: DC | PRN
  Administered 2014-01-09 – 2014-01-11 (×3): 1 mg via INTRAVENOUS
  Filled 2014-01-09 (×3): qty 1

## 2014-01-09 MED ORDER — NITROGLYCERIN 0.4 MG SL SUBL: 0.4000 mg | SUBLINGUAL_TABLET | SUBLINGUAL | Status: DC | PRN

## 2014-01-09 MED ORDER — CLINDAMYCIN PHOSPHATE 600 MG/50ML IV SOLN
600.0000 mg | Freq: Once | INTRAVENOUS | Status: AC
  Administered 2014-01-09: 600 mg via INTRAVENOUS
  Filled 2014-01-09: qty 50

## 2014-01-09 MED ORDER — MORPHINE SULFATE 2 MG/ML IJ SOLN
2.0000 mg | Freq: Once | INTRAMUSCULAR | Status: AC
  Administered 2014-01-09: 2 mg via INTRAVENOUS
  Filled 2014-01-09: qty 1

## 2014-01-09 MED ORDER — SIMVASTATIN 40 MG PO TABS
40.0000 mg | ORAL_TABLET | Freq: Every day | ORAL | Status: DC
  Filled 2014-01-09 (×3): qty 1

## 2014-01-09 MED ORDER — SODIUM CHLORIDE 0.9 % IV SOLN: 250.0000 mL | INTRAVENOUS | Status: DC | PRN

## 2014-01-09 MED ORDER — ONDANSETRON HCL 4 MG PO TABS: 4.0000 mg | ORAL_TABLET | Freq: Four times a day (QID) | ORAL | Status: DC | PRN

## 2014-01-09 MED ORDER — CARVEDILOL 25 MG PO TABS
37.5000 mg | ORAL_TABLET | Freq: Two times a day (BID) | ORAL | Status: DC
  Filled 2014-01-09 (×4): qty 1

## 2014-01-09 NOTE — ED Notes (Signed)
Feels like his throat is closing. Came in yesterday for the same, couldn't swollen anything and can't take his medicines.  States that he "should have been kept overnight and given that medicine in his IV, and then he'd be fine today". He states that he knows he's going to be "dehydrated again".

## 2014-01-09 NOTE — ED Notes (Signed)
CBG @ 241

## 2014-01-09 NOTE — Consult Note (Signed)
Reason for Consult: Throat pain, pharyngeal phlegmon Referring Physician: Ripley Fraise, MD  HPI:  David Lane is an 68 y.o. male who was admitted for treatment of his left parapharyngeal phlegmon. The patient has a PMH of diabetes, CAD, Hypertension. The patient's symptoms started 2 days ago. He had increasing sore throat, and was seen at an emergency room. He was prescribed oral antibiotics. However, the patient had significant odynophagia and dysphagia. He could not swallow his pills.  He represented to the [facility] emergency room with worsening sore throat, throat swelling, and hoarseness. His CT scan showed left parapharyngeal phlegmon. The patient is now admitted for IV antibiotics treatment and airway monitoring.   Past Medical History  Diagnosis Date  . Diabetes mellitus     Type II  . Hyperlipemia   . Hypertension   . Pancreatitis   . Anxiety   . Hepatic lesion 02/04/11  . GERD (gastroesophageal reflux disease)   . CAD (coronary artery disease)     last cath by [facility] DR.  Mihai Croitoru showing  some  disease involving LCX and small size of Diag   . NSTEMI (non-ST elevated myocardial infarction) 11/21/2009  . Shortness of breath   . CHF exacerbation, due to diastolic dysfunction 93/23/5573  . Ischemic cardiomyopathy     EF 35-40%  . Liver hemangioma     Past Surgical History  Procedure Laterality Date  . Rectal surgery      cyst  . Cardiac catheterization  2011    minimal disease, medical management  . Coronary angioplasty with stent placement  07/14/2012    successful PCI & stenting of mid LAD & PDA off the dominant CX    Family History  Problem Relation Age of Onset  . Diabetes Paternal Grandmother   . Diabetes Maternal Grandfather   . Colon cancer Neg Hx     Social History:  reports that he has never smoked. He has never used smokeless tobacco. He reports that he does not drink alcohol or use illicit drugs.  Allergies:  Allergies  Allergen  Reactions  . Lipitor [Atorvastatin] Other (See Comments)    Muscle weakness    Medications:  I have reviewed the patient's current medications. Scheduled: . carvedilol  37.5 mg Oral BID WC  . clindamycin (CLEOCIN) IV  600 mg Intravenous 4 times per day  . [START ON 01/10/2014] enoxaparin (LOVENOX) injection  40 mg Subcutaneous Q24H  . insulin aspart  0-9 Units Subcutaneous TID WC  . isosorbide mononitrate  30 mg Oral Daily  . [START ON 01/10/2014] lipase/protease/amylase  2 capsule Oral TID WC  . pantoprazole  40 mg Oral Daily  . simvastatin  40 mg Oral QHS  . sodium chloride  3 mL Intravenous Q12H   UKG:URKYHC chloride, acetaminophen, acetaminophen, HYDROcodone-acetaminophen, lipase/protease/amylase, morphine injection, nitroGLYCERIN, ondansetron (ZOFRAN) IV, ondansetron, sodium chloride  Results for orders placed during the hospital encounter of 01/09/14 (from the past 48 hour(s))  BASIC METABOLIC PANEL     Status: Abnormal   Collection Time    01/09/14 12:30 PM      Result Value Ref Range   Sodium 139  137 - 147 mEq/L   Potassium 3.8  3.7 - 5.3 mEq/L   Chloride 102  96 - 112 mEq/L   CO2 24  19 - 32 mEq/L   Glucose, Bld 255 (*) 70 - 99 mg/dL   BUN 19  6 - 23 mg/dL   Creatinine, Ser 0.90  0.50 - 1.35  mg/dL   Calcium 8.8  8.4 - 10.5 mg/dL   GFR calc non Af Amer 87 (*) >90 mL/min   GFR calc Af Amer >90  >90 mL/min   Comment: (NOTE)     The eGFR has been calculated using the CKD EPI equation.     This calculation has not been validated in all clinical situations.     eGFR's persistently <90 mL/min signify possible Chronic Kidney     Disease.  CBC WITH DIFFERENTIAL     Status: Abnormal   Collection Time    01/09/14 12:30 PM      Result Value Ref Range   WBC 19.4 (*) 4.0 - 10.5 K/uL   RBC 3.80 (*) 4.22 - 5.81 MIL/uL   Hemoglobin 12.0 (*) 13.0 - 17.0 g/dL   HCT 34.4 (*) 39.0 - 52.0 %   MCV 90.5  78.0 - 100.0 fL   MCH 31.6  26.0 - 34.0 pg   MCHC 34.9  30.0 - 36.0 g/dL   RDW  12.7  11.5 - 15.5 %   Platelets 185  150 - 400 K/uL   Neutrophils Relative % 83 (*) 43 - 77 %   Neutro Abs 16.1 (*) 1.7 - 7.7 K/uL   Lymphocytes Relative 7 (*) 12 - 46 %   Lymphs Abs 1.3  0.7 - 4.0 K/uL   Monocytes Relative 10  3 - 12 %   Monocytes Absolute 2.0 (*) 0.1 - 1.0 K/uL   Eosinophils Relative 0  0 - 5 %   Eosinophils Absolute 0.0  0.0 - 0.7 K/uL   Basophils Relative 0  0 - 1 %   Basophils Absolute 0.0  0.0 - 0.1 K/uL  CBG MONITORING, ED     Status: Abnormal   Collection Time    01/09/14  2:54 PM      Result Value Ref Range   Glucose-Capillary 241 (*) 70 - 99 mg/dL  GLUCOSE, CAPILLARY     Status: Abnormal   Collection Time    01/09/14  5:30 PM      Result Value Ref Range   Glucose-Capillary 220 (*) 70 - 99 mg/dL    Ct Soft Tissue Neck W Contrast  01/09/2014   CLINICAL DATA:  Odynophagia, left neck pain and swelling, feels like throat is closing, history diabetes, hypertension, coronary artery disease, CHF, ischemic cardiomyopathy, GERD  EXAM: CT NECK WITH CONTRAST  TECHNIQUE: Multidetector CT imaging of the neck was performed using the standard protocol following the bolus administration of intravenous contrast. Sagittal and coronal MPR images reconstructed from axial data set.  CONTRAST:  49m OMNIPAQUE IOHEXOL 300 MG/ML  SOLN IV  COMPARISON:  None  FINDINGS: Visualized intracranial structures unremarkable.  Visualized paranasal sinuses, mastoid air cells, and middle ear cavities clear bilaterally.  Vascular structures grossly patent on nondedicated exam.  Scattered reactive lymph nodes in the cervical region bilaterally a few on left minimally enlarged, including a 9 mm short axis level IIA node image 47.  Diffuse soft tissue infiltration involving the left parapharyngeal space extending from C2 to the tip of the left piriform sinus.  Displacement of the oropharynx and hypopharynx left to right.  Thickening of left lateral epiglottis and left aryepiglottic fold.  Transverse  narrowing of the airway at the level of the false cords.  Deep soft tissue edema extends medial and lateral to the left hyoid arch with infiltration extending to the left submandibular gland, which appears asymmetrically enlarged versus right.  Inferior portions of this  collection are low-attenuation with a focal area of lower attenuation inferiorly image 63, 15 x 12 mm with an minimally enhancing rim question developing abscess.  Skin thickening and subcutaneous edema left submandibular and upper left cervical regions.  Prevertebral soft tissues normal thickness.  Symmetric thyroid and parotid glands.  No discrete soft tissue mass or significantly enlarged cervical lymph nodes to suggest tumor.  Lung apices clear.  No acute osseous findings.  IMPRESSION: Trans spatial inflammatory process in the left cervical region with a large phlegmon extending from the left parapharyngeal space to the left aryepiglottic fold and left lateral aspect of epiglottis with left-to-right mass effect upon the hypopharynx and narrowing of the airway at the level of the false cords.  Findings are most likely inflammatory rather than neoplastic, without discrete mass or significant enlarged lymph nodes identified.  Question developing abscess collection inferiorly near left aryepiglottic fold 15 x 12 mm image 63.  Recommend ENT follow up.  Findings called to Dr. Christy Gentles On 01/09/2014 at 1343 hr.   Electronically Signed   By: Lavonia Dana M.D.   On: 01/09/2014 13:45   Review of Systems  Constitutional: Positive for chills.  Respiratory: Negative for shortness of breath.  Cardiovascular: Negative for chest pain.  Gastrointestinal: Positive for vomiting.  All other systems reviewed and are negative.  Blood pressure 145/79, pulse 99, temperature 97.6 F (36.4 C), temperature source Oral, resp. rate 16, height 6' (1.829 m), weight 179 lb 14.3 oz (81.6 kg), SpO2 100.00%.  Physical Exam  Constitutional: He is oriented to person,  place, and time. He appears well-developed and well-nourished.  HENT:  Head: Normocephalic.  Right Ear: External ear normal.  Left Ear: External ear normal.  Eyes: Conjunctivae are normal. Pupils are equal, round, and reactive to light.  [place]/OC: Normal mucosa. No uvula deviation. No mass or lesion is noted. Neck: Normal range of motion. The left neck is tender to touch. No obvious skin changes.  Cardiovascular: Normal rate, regular rhythm and normal heart sounds.  Pulmonary/Chest: Effort normal and breath sounds normal.  Neurological: He is alert and oriented to person, place, and time. He has normal reflexes.  Skin: Skin is warm.  Psychiatric: He has a normal mood and affect.   Assessment/Plan: Left parapharyngeal phlegmon.  The CT images are reviewed. Agree with IV clindamycin. Will continue to monitor. If his clinical condition does not improve in 48 hours, will repeat the CT scan.  Ascencion Dike 01/09/2014, 6:01 PM

## 2014-01-09 NOTE — ED Provider Notes (Signed)
Pt stable for transport No stridor He is in no distress He denies any worsening His pulse ox is 100% BP 157/81  Pulse 102  Temp(Src) 99.5 F (37.5 C) (Oral)  Resp 14  SpO2 100%    Sharyon Cable, MD 01/09/14 1529

## 2014-01-09 NOTE — H&P (Signed)
[facility] History and Physical  STRYKER VEASEY RKY:[id number] DOB: 1946-04-26 DOA: 01/09/2014  Referring physician: Dr Christy Gentles.  PCP: [name], MD   Chief Complaint: sore throat.   HPI: David Lane is a 68 y.o. male with PMH of diabetes, CAD, Hypertension who presents with worsening sore throat, throat swelling, that started couples of days ago. He was seen in the ED and was prescribe antibiotics but he was not able to swallow pills and vomited. He denies dyspnea.     Review of Systems: Negative except as per HPI.   Past Medical History  Diagnosis Date  . Diabetes mellitus     Type II  . Hyperlipemia   . Hypertension   . Pancreatitis   . Anxiety   . Hepatic lesion 02/04/11  . GERD (gastroesophageal reflux disease)   . CAD (coronary artery disease)     last cath by [facility] DR.  Mihai Croitoru showing  some  disease involving LCX and small size of Diag   . NSTEMI (non-ST elevated myocardial infarction) 11/21/2009  . Shortness of breath   . CHF exacerbation, due to diastolic dysfunction 31/51/7616  . Ischemic cardiomyopathy     EF 35-40%  . Liver hemangioma    Past Surgical History  Procedure Laterality Date  . Rectal surgery      cyst  . Cardiac catheterization  2011    minimal disease, medical management  . Coronary angioplasty with stent placement  07/14/2012    successful PCI & stenting of mid LAD & PDA off the dominant CX   Social History:  reports that he has never smoked. He has never used smokeless tobacco. He reports that he does not drink alcohol or use illicit drugs.  Allergies  Allergen Reactions  . Lipitor [Atorvastatin] Other (See Comments)    Muscle weakness    Family History  Problem Relation Age of Onset  . Diabetes Paternal Grandmother   . Diabetes Maternal Grandfather   . Colon cancer Neg Hx      Prior to Admission medications   Medication Sig Start Date End Date Taking? Authorizing Provider  ACCU-CHEK SOFTCLIX LANCETS  lancets Use as instructed 08/17/13   Renato Shin, MD  amLODipine (NORVASC) 5 MG tablet Take 5 mg by mouth at bedtime.  02/09/13   Historical Provider, MD  aspirin 81 MG tablet Take 81 mg by mouth daily.      Historical Provider, MD  carvedilol (COREG) 25 MG tablet Take 37.5 mg by mouth 2 (two) times daily with a meal.    Historical Provider, MD  furosemide (LASIX) 40 MG tablet Take 40 mg by mouth daily.    Historical Provider, MD  glucose blood (ONE TOUCH ULTRA TEST) test strip 1 each by Other route 2 (two) times daily. And lancets 2/day 250.01 08/27/13   Renato Shin, MD  glucose blood test strip Use as instructed 12/27/12   Renato Shin, MD  HYDROcodone-homatropine (HYDROMET) 5-1.5 MG/5ML syrup Take 5 mLs by mouth every 6 (six) hours as needed for cough. 01/08/14   Fransico Meadow, PA-C  insulin aspart (NOVOLOG) 100 UNIT/ML injection For use in pump. Total of 50 units per day. 12/26/13   Renato Shin, MD  Insulin Disposable Pump (V-GO 20) KIT 1 Device by Does not apply route daily. 12/26/13   Renato Shin, MD  Insulin Pen Needle (PEN NEEDLES) 31G X 6 MM MISC 1 Device by Does not apply route 2 (two) times daily. 08/17/13   Renato Shin, MD  isosorbide mononitrate (IMDUR) 30 MG 24 hr tablet Take 30 mg by mouth daily.    Historical Provider, MD  lisinopril (PRINIVIL,ZESTRIL) 40 MG tablet Take 40 mg by mouth daily.    Historical Provider, MD  Multiple Vitamin (MULTIVITAMIN) capsule Take 1 capsule by mouth daily.      Historical Provider, MD  nitroGLYCERIN (NITROSTAT) 0.4 MG SL tablet Place 1 tablet (0.4 mg total) under the tongue every 5 (five) minutes as needed for chest pain. 07/19/12   Erlene Quan, PA-C  omeprazole (PRILOSEC) 20 MG capsule Take 20 mg by mouth daily.    Historical Provider, MD  Pancrelipase, Lip-Prot-Amyl, 36000 UNITS CPEP Take 2 capsules by mouth See admin instructions. Take 2 capsules by mouth three times daily with meals and 1 capsule with each snack. 09/14/13   Lafayette Dragon, MD    penicillin v potassium (VEETID) 500 MG tablet Take 1 tablet (500 mg total) by mouth 4 (four) times daily. 01/08/14 01/15/14  Fransico Meadow, PA-C  simvastatin (ZOCOR) 40 MG tablet Take 1 tablet (40 mg total) by mouth at bedtime. 12/06/13   Mihai Croitoru, MD  spironolactone (ALDACTONE) 25 MG tablet Take 25 mg by mouth daily.    Historical Provider, MD   Physical Exam: Filed Vitals:   01/09/14 1636  BP: 145/79  Pulse: 99  Temp: 97.6 F (36.4 C)  Resp: 16    BP 145/79  Pulse 99  Temp(Src) 97.6 F (36.4 C) (Oral)  Resp 16  Ht 6' (1.829 m)  Wt 81.6 kg (179 lb 14.3 oz)  BMI 24.39 kg/m2  SpO2 100%  General:  Appears calm and comfortable Eyes: PERRL, normal lids, irises & conjunctiva ENT: grossly normal hearing, lips & tongue Neck: swelling mid neck and left side neck.  Cardiovascular: RRR, no m/r/g. No LE edema. Respiratory: CTA bilaterally, no w/r/r. Normal respiratory effort. Abdomen: soft, ntnd Skin: no rash or induration seen on limited exam Musculoskeletal: grossly normal tone BUE/BLE Psychiatric: grossly normal mood and affect, speech fluent and appropriate Neurologic: grossly non-focal.          Labs on Admission:  Basic Metabolic Panel:  Recent Labs Lab 01/08/14 1345 01/09/14 1230  NA 137 139  K 3.8 3.8  CL 99 102  CO2 22 24  GLUCOSE 300* 255*  BUN 16 19  CREATININE 0.90 0.90  CALCIUM 9.3 8.8   Liver Function Tests: No results found for this basename: AST, ALT, ALKPHOS, BILITOT, PROT, ALBUMIN,  in the last 168 hours No results found for this basename: LIPASE, AMYLASE,  in the last 168 hours No results found for this basename: AMMONIA,  in the last 168 hours CBC:  Recent Labs Lab 01/08/14 1345 01/09/14 1230  WBC 22.1* 19.4*  NEUTROABS 18.7* 16.1*  HGB 13.2 12.0*  HCT 37.0* 34.4*  MCV 89.8 90.5  PLT 207 185   Cardiac Enzymes: No results found for this basename: CKTOTAL, CKMB, CKMBINDEX, TROPONINI,  in the last 168 hours  BNP (last 3  results) No results found for this basename: PROBNP,  in the last 8760 hours CBG:  Recent Labs Lab 01/09/14 1454  GLUCAP 241*    Radiological Exams on Admission: Ct Soft Tissue Neck W Contrast  01/09/2014   CLINICAL DATA:  Odynophagia, left neck pain and swelling, feels like throat is closing, history diabetes, hypertension, coronary artery disease, CHF, ischemic cardiomyopathy, GERD  EXAM: CT NECK WITH CONTRAST  TECHNIQUE: Multidetector CT imaging of the neck was performed using the standard protocol  following the bolus administration of intravenous contrast. Sagittal and coronal MPR images reconstructed from axial data set.  CONTRAST:  74m OMNIPAQUE IOHEXOL 300 MG/ML  SOLN IV  COMPARISON:  None  FINDINGS: Visualized intracranial structures unremarkable.  Visualized paranasal sinuses, mastoid air cells, and middle ear cavities clear bilaterally.  Vascular structures grossly patent on nondedicated exam.  Scattered reactive lymph nodes in the cervical region bilaterally a few on left minimally enlarged, including a 9 mm short axis level IIA node image 47.  Diffuse soft tissue infiltration involving the left parapharyngeal space extending from C2 to the tip of the left piriform sinus.  Displacement of the oropharynx and hypopharynx left to right.  Thickening of left lateral epiglottis and left aryepiglottic fold.  Transverse narrowing of the airway at the level of the false cords.  Deep soft tissue edema extends medial and lateral to the left hyoid arch with infiltration extending to the left submandibular gland, which appears asymmetrically enlarged versus right.  Inferior portions of this collection are low-attenuation with a focal area of lower attenuation inferiorly image 63, 15 x 12 mm with an minimally enhancing rim question developing abscess.  Skin thickening and subcutaneous edema left submandibular and upper left cervical regions.  Prevertebral soft tissues normal thickness.  Symmetric thyroid  and parotid glands.  No discrete soft tissue mass or significantly enlarged cervical lymph nodes to suggest tumor.  Lung apices clear.  No acute osseous findings.  IMPRESSION: Trans spatial inflammatory process in the left cervical region with a large phlegmon extending from the left parapharyngeal space to the left aryepiglottic fold and left lateral aspect of epiglottis with left-to-right mass effect upon the hypopharynx and narrowing of the airway at the level of the false cords.  Findings are most likely inflammatory rather than neoplastic, without discrete mass or significant enlarged lymph nodes identified.  Question developing abscess collection inferiorly near left aryepiglottic fold 15 x 12 mm image 63.  Recommend ENT follow up.  Findings called to Dr. WChristy GentlesOn 01/09/2014 at 1343 hr.   Electronically Signed   By: MLavonia DanaM.D.   On: 01/09/2014 13:45      Assessment/Plan Active Problems:   Parapharyngeal abscess   Pharyngitis   1-Pharyngitis, Developing epiglottic abscess; Dr TRosalva Ferron Continue with IV clindamycin. Npo until ENT see patient. Follow WBC trend.   2-CAD; continue with imdur. Hold lasix and spironolactone for now.   3-Diabetes; SSI. Will start lantus when tolerating diet.   Code Status: full code.  Family Communication: Care discussed with patient.  Disposition Plan: expect 3 to 4 days inpatient.   Time spent: 75 minutes.   [facility] Pager [phone]

## 2014-01-09 NOTE — Progress Notes (Signed)
BP 149/75  Pulse 105  Temp(Src) 99.5 F (37.5 C) (Oral)  Resp 18  SpO2 98% Patient Active Problem List   Diagnosis Date Noted  . Type I (juvenile type) diabetes mellitus with unspecified complication, not stated as uncontrolled 07/23/2013  . Pancreatic insufficiency 01/25/2013  . Diarrhea 01/25/2013  . Ischemic cardiomyopathy, EF 35-40% by echo 07/14/12 07/19/2012  . Acute systolic CHF (congestive heart failure) 07/17/2012  . Chronic combined systolic and diastolic CHF (congestive heart failure) 07/14/2012  . Pulmonary edema, most likely due to diastolic dysfunction 01/77/9390  . Respiratory distress 07/14/2012  . Hypertensive urgency 07/14/2012  . CAD, 07/14/12- LAD/PDA DES   . ANXIETY 12/12/2009  . DIABETES MELLITUS, TYPE II, labile 04/24/2007  . HYPERLIPIDEMIA 04/24/2007  . HYPERTENSION 04/24/2007  . PANCREATITIS, HX OF 04/24/2007   Past medical history as above that was seen in the ER 2 days prior to admission for pharyngitis was started oral antibiotics but he could not tolerate it. He came back to [facility] saying that he couldn't swallow his pills with a CT scan of the head and neck was done that showed Question developing abscess collection inferiorly near left aryepiglottic fold 15 x 12 mm. ER physician has already consulted ENT surgeon Dr. Benjamine Mola, who asked him to try and admit and he will see the patient is seen is he gets to: Hospital. He was started on IV clindamycin. The patient is continuing his airway satting greater than 98% on room air on able to drink water with no drooling. He would go to step down.

## 2014-01-09 NOTE — ED Provider Notes (Signed)
CSN: [id number]     Arrival date & time 01/09/14  1140 History   First MD Initiated Contact with Patient 01/09/14 1200     Chief Complaint  Patient presents with  . Oral Swelling      Patient is a 68 y.o. male presenting with pharyngitis. The history is provided by the patient.  Sore Throat This is a new problem. The current episode started 2 days ago. The problem occurs daily. The problem has been gradually worsening. Pertinent negatives include no chest pain and no shortness of breath. The symptoms are aggravated by swallowing. The symptoms are relieved by rest. He has tried rest (antibiotics) for the symptoms. The treatment provided no relief.  pt reports he has had sore throat over 2 days He reports she was seen in the ED on 4/14, given antibiotics and felt improved initially, but went home and now feels worse.  He reports he can not tolerate the oral antibiotics at home. No new fever He did vomit once   Past Medical History  Diagnosis Date  . Diabetes mellitus     Type II  . Hyperlipemia   . Hypertension   . Pancreatitis   . Anxiety   . Hepatic lesion 02/04/11  . GERD (gastroesophageal reflux disease)   . CAD (coronary artery disease)     last cath by [facility] DR.  Mihai Croitoru showing  some  disease involving LCX and small size of Diag   . NSTEMI (non-ST elevated myocardial infarction) 11/21/2009  . Shortness of breath   . CHF exacerbation, due to diastolic dysfunction 63/33/5456  . Ischemic cardiomyopathy     EF 35-40%  . Liver hemangioma    Past Surgical History  Procedure Laterality Date  . Rectal surgery      cyst  . Cardiac catheterization  2011    minimal disease, medical management  . Coronary angioplasty with stent placement  07/14/2012    successful PCI & stenting of mid LAD & PDA off the dominant CX   Family History  Problem Relation Age of Onset  . Diabetes Paternal Grandmother   . Diabetes Maternal Grandfather   . Colon cancer Neg Hx    History   Substance Use Topics  . Smoking status: Never Smoker   . Smokeless tobacco: Never Used  . Alcohol Use: No    Review of Systems  Constitutional: Positive for chills.  Respiratory: Negative for shortness of breath.   Cardiovascular: Negative for chest pain.  Gastrointestinal: Positive for vomiting.  All other systems reviewed and are negative.     Allergies  Lipitor  Home Medications   Prior to Admission medications   Medication Sig Start Date End Date Taking? Authorizing Provider  ACCU-CHEK SOFTCLIX LANCETS lancets Use as instructed 08/17/13   Renato Shin, MD  amLODipine (NORVASC) 5 MG tablet Take 5 mg by mouth at bedtime.  02/09/13   Historical Provider, MD  aspirin 81 MG tablet Take 81 mg by mouth daily.      Historical Provider, MD  carvedilol (COREG) 25 MG tablet Take 37.5 mg by mouth 2 (two) times daily with a meal.    Historical Provider, MD  furosemide (LASIX) 40 MG tablet Take 40 mg by mouth daily.    Historical Provider, MD  glucose blood (ONE TOUCH ULTRA TEST) test strip 1 each by Other route 2 (two) times daily. And lancets 2/day 250.01 08/27/13   Renato Shin, MD  glucose blood test strip Use as instructed 12/27/12   Hilliard Clark  Loanne Drilling, MD  HYDROcodone-homatropine (HYDROMET) 5-1.5 MG/5ML syrup Take 5 mLs by mouth every 6 (six) hours as needed for cough. 01/08/14   Fransico Meadow, PA-C  insulin aspart (NOVOLOG) 100 UNIT/ML injection For use in pump. Total of 50 units per day. 12/26/13   Renato Shin, MD  Insulin Disposable Pump (V-GO 20) KIT 1 Device by Does not apply route daily. 12/26/13   Renato Shin, MD  Insulin Pen Needle (PEN NEEDLES) 31G X 6 MM MISC 1 Device by Does not apply route 2 (two) times daily. 08/17/13   Renato Shin, MD  isosorbide mononitrate (IMDUR) 30 MG 24 hr tablet Take 30 mg by mouth daily.    Historical Provider, MD  lisinopril (PRINIVIL,ZESTRIL) 40 MG tablet Take 40 mg by mouth daily.    Historical Provider, MD  Multiple Vitamin (MULTIVITAMIN) capsule Take  1 capsule by mouth daily.      Historical Provider, MD  nitroGLYCERIN (NITROSTAT) 0.4 MG SL tablet Place 1 tablet (0.4 mg total) under the tongue every 5 (five) minutes as needed for chest pain. 07/19/12   Erlene Quan, PA-C  omeprazole (PRILOSEC) 20 MG capsule Take 20 mg by mouth daily.    Historical Provider, MD  Pancrelipase, Lip-Prot-Amyl, 36000 UNITS CPEP Take 2 capsules by mouth See admin instructions. Take 2 capsules by mouth three times daily with meals and 1 capsule with each snack. 09/14/13   Lafayette Dragon, MD  penicillin v potassium (VEETID) 500 MG tablet Take 1 tablet (500 mg total) by mouth 4 (four) times daily. 01/08/14 01/15/14  Fransico Meadow, PA-C  simvastatin (ZOCOR) 40 MG tablet Take 1 tablet (40 mg total) by mouth at bedtime. 12/06/13   Mihai Croitoru, MD  spironolactone (ALDACTONE) 25 MG tablet Take 25 mg by mouth daily.    Historical Provider, MD   BP 149/75  Pulse 105  Temp(Src) 99.5 F (37.5 C) (Oral)  Resp 18  SpO2 98%  Physical Exam CONSTITUTIONAL: Well developed/well nourished HEAD: Normocephalic/atraumatic EYES: EOMI/PERRL ENMT: Mucous membranes moist, uvula midline, no exudates/erythema noted.  No stridor No oral swelling noted. He is handling his secretions NECK: supple no meningeal signs. He does have fullness to anterior neck, but neck is supple and no erythema or induration noted. No crepitus is noted SPINE:entire spine nontender CV: S1/S2 noted LUNGS: Lungs are clear to auscultation bilaterally, no apparent distress ABDOMEN: soft, nontender, no rebound or guarding GU:no cva tenderness NEURO: Pt is awake/alert, moves all extremitiesx4 EXTREMITIES: pulses normal, full ROM SKIN: warm, color normal PSYCH: no abnormalities of mood noted  ED Course  Procedures  CRITICAL CARE Performed by: Sharyon Cable Total critical care time: 33 Critical care time was exclusive of separately billable procedures and treating other patients. Critical care was  necessary to treat or prevent imminent or life-threatening deterioration. Critical care was time spent personally by me on the following activities: development of treatment plan with patient and/or surrogate as well as nursing, discussions with consultants, evaluation of patient's response to treatment, examination of patient, obtaining history from patient or surrogate, ordering and performing treatments and interventions, ordering and review of laboratory studies, ordering and review of radiographic studies, pulse oximetry and re-evaluation of patient's condition.  1:24 PM Due to persistent difficulty swallowing and painful swallowing, will order CT neck Pt currently stable and protecting his airway 1:49 PM D/w radiology CT results Will consult ENT Pt is currently awake/alert, protecting his airway Will start unasyn 2:00 PM D/w dr Benjamine Mola He requests to also  start clindamycin He requests admit to hospitalist Will place on stepdown due to need for close monitoring He is having no stridor or resp distress at this time 2:46 PM Pt sleeping.  He denies any worsening symptoms He has no stridor He is handling his secretions and no drooling He continues to be stable and no hypoxia D/w dr Venetia Constable with [facility] Will admit to stepdown with ENT consultation  Imaging Review Ct Soft Tissue Neck W Contrast  01/09/2014   CLINICAL DATA:  Odynophagia, left neck pain and swelling, feels like throat is closing, history diabetes, hypertension, coronary artery disease, CHF, ischemic cardiomyopathy, GERD  EXAM: CT NECK WITH CONTRAST  TECHNIQUE: Multidetector CT imaging of the neck was performed using the standard protocol following the bolus administration of intravenous contrast. Sagittal and coronal MPR images reconstructed from axial data set.  CONTRAST:  38m OMNIPAQUE IOHEXOL 300 MG/ML  SOLN IV  COMPARISON:  None  FINDINGS: Visualized intracranial structures unremarkable.  Visualized paranasal sinuses, mastoid  air cells, and middle ear cavities clear bilaterally.  Vascular structures grossly patent on nondedicated exam.  Scattered reactive lymph nodes in the cervical region bilaterally a few on left minimally enlarged, including a 9 mm short axis level IIA node image 47.  Diffuse soft tissue infiltration involving the left parapharyngeal space extending from C2 to the tip of the left piriform sinus.  Displacement of the oropharynx and hypopharynx left to right.  Thickening of left lateral epiglottis and left aryepiglottic fold.  Transverse narrowing of the airway at the level of the false cords.  Deep soft tissue edema extends medial and lateral to the left hyoid arch with infiltration extending to the left submandibular gland, which appears asymmetrically enlarged versus right.  Inferior portions of this collection are low-attenuation with a focal area of lower attenuation inferiorly image 63, 15 x 12 mm with an minimally enhancing rim question developing abscess.  Skin thickening and subcutaneous edema left submandibular and upper left cervical regions.  Prevertebral soft tissues normal thickness.  Symmetric thyroid and parotid glands.  No discrete soft tissue mass or significantly enlarged cervical lymph nodes to suggest tumor.  Lung apices clear.  No acute osseous findings.  IMPRESSION: Trans spatial inflammatory process in the left cervical region with a large phlegmon extending from the left parapharyngeal space to the left aryepiglottic fold and left lateral aspect of epiglottis with left-to-right mass effect upon the hypopharynx and narrowing of the airway at the level of the false cords.  Findings are most likely inflammatory rather than neoplastic, without discrete mass or significant enlarged lymph nodes identified.  Question developing abscess collection inferiorly near left aryepiglottic fold 15 x 12 mm image 63.  Recommend ENT follow up.  Findings called to Dr. WChristy GentlesOn 01/09/2014 at 1343 hr.    Electronically Signed   By: MLavonia DanaM.D.   On: 01/09/2014 13:45     MDM   Final diagnoses:  Odynophagia  Parapharyngeal abscess    Nursing notes including past medical history and social history reviewed and considered in documentation Previous records reviewed and considered - recent ED visit reviewed Labs/vital reviewed and considered     DSharyon Cable MD 01/09/14 1448

## 2014-01-10 DIAGNOSIS — I2589 Other forms of chronic ischemic heart disease: Secondary | ICD-10-CM

## 2014-01-10 DIAGNOSIS — E119 Type 2 diabetes mellitus without complications: Secondary | ICD-10-CM

## 2014-01-10 DIAGNOSIS — E785 Hyperlipidemia, unspecified: Secondary | ICD-10-CM

## 2014-01-10 DIAGNOSIS — I10 Essential (primary) hypertension: Secondary | ICD-10-CM

## 2014-01-10 DIAGNOSIS — J029 Acute pharyngitis, unspecified: Secondary | ICD-10-CM

## 2014-01-10 DIAGNOSIS — I509 Heart failure, unspecified: Secondary | ICD-10-CM

## 2014-01-10 DIAGNOSIS — I5042 Chronic combined systolic (congestive) and diastolic (congestive) heart failure: Secondary | ICD-10-CM

## 2014-01-10 DIAGNOSIS — K8689 Other specified diseases of pancreas: Secondary | ICD-10-CM

## 2014-01-10 DIAGNOSIS — F411 Generalized anxiety disorder: Secondary | ICD-10-CM

## 2014-01-10 LAB — CBC WITH DIFFERENTIAL/PLATELET
BASOS PCT: 0 % (ref 0–1)
Basophils Absolute: 0 10*3/uL (ref 0.0–0.1)
Eosinophils Absolute: 0 10*3/uL (ref 0.0–0.7)
Eosinophils Relative: 0 % (ref 0–5)
HCT: 32.5 % — ABNORMAL LOW (ref 39.0–52.0)
Hemoglobin: 11.4 g/dL — ABNORMAL LOW (ref 13.0–17.0)
Lymphocytes Relative: 9 % — ABNORMAL LOW (ref 12–46)
Lymphs Abs: 1.4 10*3/uL (ref 0.7–4.0)
MCH: 31.1 pg (ref 26.0–34.0)
MCHC: 35.1 g/dL (ref 30.0–36.0)
MCV: 88.8 fL (ref 78.0–100.0)
MONO ABS: 1.2 10*3/uL — AB (ref 0.1–1.0)
Monocytes Relative: 8 % (ref 3–12)
Neutro Abs: 12.4 10*3/uL — ABNORMAL HIGH (ref 1.7–7.7)
Neutrophils Relative %: 83 % — ABNORMAL HIGH (ref 43–77)
PLATELETS: 183 10*3/uL (ref 150–400)
RBC: 3.66 MIL/uL — ABNORMAL LOW (ref 4.22–5.81)
RDW: 13.2 % (ref 11.5–15.5)
WBC: 15.1 10*3/uL — AB (ref 4.0–10.5)

## 2014-01-10 LAB — COMPREHENSIVE METABOLIC PANEL
ALBUMIN: 2.9 g/dL — AB (ref 3.5–5.2)
ALK PHOS: 67 U/L (ref 39–117)
ALT: 18 U/L (ref 0–53)
AST: 15 U/L (ref 0–37)
BUN: 22 mg/dL (ref 6–23)
CHLORIDE: 101 meq/L (ref 96–112)
CO2: 23 mEq/L (ref 19–32)
Calcium: 8.6 mg/dL (ref 8.4–10.5)
Creatinine, Ser: 0.84 mg/dL (ref 0.50–1.35)
GFR calc Af Amer: >90 mL/min (ref >90)
GFR calc non Af Amer: 90 mL/min — ABNORMAL LOW (ref >90)
Glucose, Bld: 241 mg/dL — ABNORMAL HIGH (ref 70–99)
POTASSIUM: 4.1 meq/L (ref 3.7–5.3)
Sodium: 141 mEq/L (ref 137–147)
Total Bilirubin: 0.7 mg/dL (ref 0.3–1.2)
Total Protein: 6.5 g/dL (ref 6.0–8.3)

## 2014-01-10 LAB — GLUCOSE, CAPILLARY
GLUCOSE-CAPILLARY: 200 mg/dL — AB (ref 70–99)
GLUCOSE-CAPILLARY: 183 mg/dL — AB (ref 70–99)
Glucose-Capillary: 224 mg/dL — ABNORMAL HIGH (ref 70–99)
Glucose-Capillary: 208 mg/dL — ABNORMAL HIGH (ref 70–99)
Glucose-Capillary: 193 mg/dL — ABNORMAL HIGH (ref 70–99)

## 2014-01-10 LAB — CULTURE, GROUP A STREP

## 2014-01-10 MED ORDER — METOPROLOL TARTRATE 1 MG/ML IV SOLN
2.5000 mg | Freq: Four times a day (QID) | INTRAVENOUS | Status: DC
  Administered 2014-01-10 – 2014-01-11 (×4): 2.5 mg via INTRAVENOUS
  Filled 2014-01-10 (×7): qty 5

## 2014-01-10 MED ORDER — MAGIC MOUTHWASH W/LIDOCAINE
15.0000 mL | Freq: Once | ORAL | Status: DC
  Filled 2014-01-10: qty 15

## 2014-01-10 MED ORDER — DEXTROSE-NACL 5-0.9 % IV SOLN
INTRAVENOUS | Status: DC
  Administered 2014-01-10: 999 mL via INTRAVENOUS
  Administered 2014-01-11 – 2014-01-13 (×3): via INTRAVENOUS

## 2014-01-10 MED ORDER — INSULIN ASPART 100 UNIT/ML ~~LOC~~ SOLN
0.0000 [IU] | SUBCUTANEOUS | Status: DC
Start: 2014-01-10 — End: 2014-01-12
  Administered 2014-01-10 (×2): 3 [IU] via SUBCUTANEOUS
  Administered 2014-01-10 – 2014-01-11 (×3): 5 [IU] via SUBCUTANEOUS
  Administered 2014-01-11 (×2): 3 [IU] via SUBCUTANEOUS
  Administered 2014-01-11: 5 [IU] via SUBCUTANEOUS
  Administered 2014-01-11: 3 [IU] via SUBCUTANEOUS
  Administered 2014-01-12: 5 [IU] via SUBCUTANEOUS
  Administered 2014-01-12: 3 [IU] via SUBCUTANEOUS
  Administered 2014-01-12 (×2): 5 [IU] via SUBCUTANEOUS

## 2014-01-10 NOTE — Progress Notes (Signed)
Subjective: The patient complains of difficulty with his swallowing and speech.  However, his left neck tenderness has slightly improved.  Objective: Vital signs in last 24 hours: Temp:  [97.4 F (36.3 C)-98.3 F (36.8 C)] 98 F (36.7 C) (04/16 1609) Pulse Rate:  [87-109] 87 (04/16 1609) Resp:  [11-24] 11 (04/16 1609) BP: (127-162)/(30-79) 127/51 mmHg (04/16 1609) SpO2:  [97 %-100 %] 99 % (04/16 1609)  Physical Exam  Constitutional: He is oriented to person, place, and time. He appears well-developed and well-nourished.  HENT:  Head: Normocephalic.  Ears: EAC and TM wnl. Eyes: Conjunctivae are normal. Pupils are equal, round, and reactive to light.  [place]/OC: Normal mucosa. No uvula deviation. No mass or lesion is noted.  Neck: Normal range of motion. The left neck is mildly edematous and slightly tender to touch. No obvious skin changes.  No stridor. Cardiovascular: Normal rate, regular rhythm and normal heart sounds.  Pulmonary/Chest: Effort normal and breath sounds normal.  Neurological: He is alert and oriented to person, place, and time. He has normal reflexes.  Skin: Skin is warm.  Psychiatric: He has a normal mood and affect.   Recent Labs  01/09/14 1230 01/10/14 0735  WBC 19.4* 15.1*  HGB 12.0* 11.4*  HCT 34.4* 32.5*  PLT 185 183    Recent Labs  01/09/14 1230 01/10/14 0735  NA 139 141  K 3.8 4.1  CL 102 101  CO2 24 23  GLUCOSE 255* 241*  BUN 19 22  CREATININE 0.90 0.84  CALCIUM 8.8 8.6    Medications:  I have reviewed the patient's current medications. Scheduled: . carvedilol  37.5 mg Oral BID WC  . clindamycin (CLEOCIN) IV  600 mg Intravenous 4 times per day  . enoxaparin (LOVENOX) injection  40 mg Subcutaneous Q24H  . insulin aspart  0-15 Units Subcutaneous 6 times per day  . isosorbide mononitrate  30 mg Oral Daily  . lipase/protease/amylase  2 capsule Oral TID WC  . pantoprazole  40 mg Oral Daily  . simvastatin  40 mg Oral QHS  . sodium chloride   3 mL Intravenous Q12H   UYQ:IHKVQQ chloride, acetaminophen, acetaminophen, HYDROcodone-acetaminophen, lipase/protease/amylase, morphine injection, nitroGLYCERIN, ondansetron (ZOFRAN) IV, ondansetron, sodium chloride  Assessment/Plan: Left parapharyngeal phlegmon. Continue with IV clindamycin. Will continue to monitor. If his clinical condition does not improve tomorrow, will repeat the CT scan.    LOS: 1 day   David Lane David Lane 01/10/2014, 4:39 PM

## 2014-01-10 NOTE — Progress Notes (Signed)
Spoke with patient regarding how he controls his blood sugars. Pt states he takes one shot a day of lantus 40 units. States he does not taken anything else. He states he does not want to run below 200 mg/dL as he fears hypoglycemia. He states that one incident of hypo takes him 2 days to recover to feel normal again.  His OP MD is Dr Loanne Drilling who pt states suggested he might try what sounds like the V-Go insulin pump, a 24 hr pump that delivers bolus insulin only. However he states that this problem here with is throat developed thus he hasn't explored that idea any further.  He states he rarely checks his blood sugars other than first thing in the morning.  Pt states he can only go one day at a time and that every time he goes to his doctor, he gets negative feedback which sets him back even further with feelilng bad. Sounds like the lantus is intended to cover basal needs as well as bolus needs which often does not work well as there are no peaks to cover bolus neds.  ( May want to add some basal lantus to this pt's regimen to cover just pt's basal needs. Would recommend 20 units 0.2 units/kg) in addition to the sensitive correction to get a little better glucose control while here.  Will follow and glad to assist. Thank you, Rosita Kea, RN, CNS, Diabetes Coordinator [phone])

## 2014-01-10 NOTE — Progress Notes (Signed)
Inpatient Diabetes Program Recommendations  AACE/ADA: New Consensus Statement on Inpatient Glycemic Control (2013)  Target Ranges:  Prepandial:   less than 140 mg/dL      Peak postprandial:   less than 180 mg/dL (1-2 hours)      Critically ill patients:  140 - 180 mg/dL   Inpatient Diabetes Program Recommendations Insulin - Basal: No basal insulin ordered. History states he takes approx 50 units insulin per day using his V-Go pump.   Will see patient and assess needs Glucose staying high in 200's.  If not on his pump, pt will need basal lantus/levemir while here, at least 29-30 units.  Thank you, Rosita Kea, RN, CNS, Diabetes Coordinator [phone])

## 2014-01-10 NOTE — Progress Notes (Signed)
Utilization Review Completed.  

## 2014-01-10 NOTE — Progress Notes (Signed)
[place] TEAM 1 - Stepdown/ICU TEAM Progress Note  WALLER MARCUSSEN HFW:[id number] DOB: 1946/03/24 DOA: 01/09/2014 PCP: [name], MD  Admit HPI / Brief Narrative: David Lane is a 68 y.o. WM PMHx labile diabetes type 2 on insulin pump, CAD, Hypertension, HLD, CAD S/P NSTEMI with stent placement in LAD/PDA, ischemic cardiomyopathy. Presented with worsening sore throat, throat swelling, that started couples of days ago. He was seen in the ED and was prescribe antibiotics but he was not able to swallow pills and vomited. He denies dyspnea.4/16 patient states throat has improved but still sore, still chokes whenever he attempts to consume water.   Assessment/Plan: Left parapharyngeal phlegmon -Continue clindamycin per ENT - 4/16 Phlegmon Lt side; unsure Pt protecting airway; Speech consult  Diabetes type 2 -Increase SSI to moderate -When patient able to start eating meals will start Lantus at 30 units -Will need to restart patient on his insulin pump 24-48 hours prior to discharge   Systolic and diastolic CHF -Which is improved on patient's most recent echocardiogram 08/01/2013 -Continue Imdur 30 mg daily -Continue Coreg 37.5 mg BID; until the patient can swallow will start metoprolol IV 2.5 mg QID -  HTN -See systolic/diastolic CHF  HLD -Continue Zocor 40 mg daily  Pancreatic insufficiency -Continue Pancrease QAC  Anxiety -Currently not an issue     Code Status: FULL Family Communication: no family present at time of exam Disposition Plan: Per ENT    Devices     LINES / TUBES:    Culture MRSA by PCR negative Rapid strep screen negative   Consultants: Dr.Sui Teoh(ENT)    Procedures/Significant Events: CT soft tissue neck with contrast 01/09/2014 -large phlegmon extending from the left parapharyngeal space to the  left aryepiglottic fold and left lateral aspect of epiglottis with  left-to-right mass effect upon the hypopharynx and  narrowing of the  airway at the level of the false cords.  Echocardiogram 08/01/2013 Left ventricle: The cavity size was normal. Mild concentric hypertrophy. - LVEF=  50% to 55%. Wall motion was normal;  - Aortic valve: Mild regurgitation. - Mitral valve: Mild regurgitation.     Antibiotics: Clindamycin 4/15>>   DVT prophylaxis: Lovenox 40 mg daily  Continuous Infusions:   Objective: VITAL SIGNS:  Blood pressure 153/77, pulse 96, temperature 97.5 F (36.4 C), temperature source Oral, resp. rate 15, height 6' (1.829 m), weight 81.6 kg (179 lb 14.3 oz), SpO2 98.00%.  Intake/Output Summary (Last 24 hours) at 01/10/14 1254 Last data filed at 01/10/14 0900  Gross per 24 hour  Intake      0 ml  Output    750 ml  Net   -750 ml     Exam: General: A./O. x4, mild discomfort secondary to sore throat and inability to swallow. Lungs: Clear to auscultation bilaterally without wheezes or crackles Cardiovascular: Regular rate and rhythm without murmur gallop or rub normal S1 and S2 Renalbalance today;-975  /overall; -1275       Creatinine ;        Hourly output   Abdomen: Nontender, nondistended, soft, bowel sounds positive, no rebound, no ascites, no appreciable mass Extremities: No significant cyanosis, clubbing, or edema bilateral lower extremities  Data Reviewed: Basic Metabolic Panel:  Recent Labs Lab 01/08/14 1345 01/09/14 1230 01/10/14 0735  NA 137 139 141  K 3.8 3.8 4.1  CL 99 102 101  CO2 22 24 23   GLUCOSE 300* 255* 241*  BUN 16 19 22   CREATININE 0.90 0.90 0.84  CALCIUM  9.3 8.8 8.6   Liver Function Tests:  Recent Labs Lab 01/10/14 0735  AST 15  ALT 18  ALKPHOS 67  BILITOT 0.7  PROT 6.5  ALBUMIN 2.9*   No results found for this basename: LIPASE, AMYLASE,  in the last 168 hours No results found for this basename: AMMONIA,  in the last 168 hours CBC:  Recent Labs Lab 01/08/14 1345 01/09/14 1230 01/10/14 0735  WBC 22.1* 19.4* 15.1*  NEUTROABS  18.7* 16.1* 12.4*  HGB 13.2 12.0* 11.4*  HCT 37.0* 34.4* 32.5*  MCV 89.8 90.5 88.8  PLT 207 185 183   Cardiac Enzymes: No results found for this basename: CKTOTAL, CKMB, CKMBINDEX, TROPONINI,  in the last 168 hours BNP (last 3 results) No results found for this basename: PROBNP,  in the last 8760 hours CBG:  Recent Labs Lab 01/09/14 1454 01/09/14 1730 01/09/14 2210 01/10/14 0741 01/10/14 1158  GLUCAP 241* 220* 174* 200* 224*    Recent Results (from the past 240 hour(s))  RAPID STREP SCREEN     Status: None   Collection Time    01/08/14  1:35 PM      Result Value Ref Range Status   Streptococcus, Group A Screen (Direct) NEGATIVE  NEGATIVE Final  MRSA PCR SCREENING     Status: None   Collection Time    01/09/14  4:38 PM      Result Value Ref Range Status   MRSA by PCR NEGATIVE  NEGATIVE Final   Comment:            The GeneXpert MRSA Assay (FDA     approved for NASAL specimens     only), is one component of a     comprehensive MRSA colonization     surveillance program. It is not     intended to diagnose MRSA     infection nor to guide or     monitor treatment for     MRSA infections.     Studies:  Recent x-ray studies have been reviewed in detail by the Attending Physician  Scheduled Meds:  Scheduled Meds: . carvedilol  37.5 mg Oral BID WC  . clindamycin (CLEOCIN) IV  600 mg Intravenous 4 times per day  . enoxaparin (LOVENOX) injection  40 mg Subcutaneous Q24H  . insulin aspart  0-9 Units Subcutaneous TID WC  . isosorbide mononitrate  30 mg Oral Daily  . lipase/protease/amylase  2 capsule Oral TID WC  . pantoprazole  40 mg Oral Daily  . simvastatin  40 mg Oral QHS  . sodium chloride  3 mL Intravenous Q12H    Time spent on care of this patient: 40 mins   Allie Bossier , MD   [facility] Office  [phone] Pager [phone]  On-Call/Text Page:      [email or web address]      password [email or web address]  If 7PM-7AM, please contact  night-coverage [email or web address] Password [email or web address] 01/10/2014, 12:54 PM   LOS: 1 day

## 2014-01-11 LAB — GLUCOSE, CAPILLARY
GLUCOSE-CAPILLARY: 175 mg/dL — AB (ref 70–99)
GLUCOSE-CAPILLARY: 232 mg/dL — AB (ref 70–99)
Glucose-Capillary: 233 mg/dL — ABNORMAL HIGH (ref 70–99)
Glucose-Capillary: 187 mg/dL — ABNORMAL HIGH (ref 70–99)
Glucose-Capillary: 211 mg/dL — ABNORMAL HIGH (ref 70–99)
Glucose-Capillary: 210 mg/dL — ABNORMAL HIGH (ref 70–99)
Glucose-Capillary: 198 mg/dL — ABNORMAL HIGH (ref 70–99)

## 2014-01-11 LAB — CBC WITH DIFFERENTIAL/PLATELET
Basophils Absolute: 0 10*3/uL (ref 0.0–0.1)
Basophils Relative: 0 % (ref 0–1)
Eosinophils Absolute: 0 10*3/uL (ref 0.0–0.7)
Eosinophils Relative: 0 % (ref 0–5)
HCT: 32.3 % — ABNORMAL LOW (ref 39.0–52.0)
Hemoglobin: 11.1 g/dL — ABNORMAL LOW (ref 13.0–17.0)
Lymphocytes Relative: 10 % — ABNORMAL LOW (ref 12–46)
Lymphs Abs: 1.1 10*3/uL (ref 0.7–4.0)
MCH: 30.8 pg (ref 26.0–34.0)
MCHC: 34.4 g/dL (ref 30.0–36.0)
MCV: 89.7 fL (ref 78.0–100.0)
Monocytes Absolute: 1.2 10*3/uL — ABNORMAL HIGH (ref 0.1–1.0)
Monocytes Relative: 11 % (ref 3–12)
NEUTROS ABS: 9.1 10*3/uL — AB (ref 1.7–7.7)
NEUTROS PCT: 79 % — AB (ref 43–77)
Platelets: 234 10*3/uL (ref 150–400)
RBC: 3.6 MIL/uL — ABNORMAL LOW (ref 4.22–5.81)
RDW: 13.4 % (ref 11.5–15.5)
WBC: 11.5 10*3/uL — ABNORMAL HIGH (ref 4.0–10.5)

## 2014-01-11 LAB — COMPREHENSIVE METABOLIC PANEL
ALT: 16 U/L (ref 0–53)
AST: 14 U/L (ref 0–37)
Albumin: 2.6 g/dL — ABNORMAL LOW (ref 3.5–5.2)
Alkaline Phosphatase: 80 U/L (ref 39–117)
BUN: 26 mg/dL — AB (ref 6–23)
CALCIUM: 8.5 mg/dL (ref 8.4–10.5)
CO2: 24 meq/L (ref 19–32)
Chloride: 102 mEq/L (ref 96–112)
Creatinine, Ser: 0.8 mg/dL (ref 0.50–1.35)
GLUCOSE: 222 mg/dL — AB (ref 70–99)
Potassium: 3.4 mEq/L — ABNORMAL LOW (ref 3.7–5.3)
Sodium: 140 mEq/L (ref 137–147)
Total Bilirubin: 0.6 mg/dL (ref 0.3–1.2)
Total Protein: 6.5 g/dL (ref 6.0–8.3)

## 2014-01-11 LAB — MAGNESIUM: Magnesium: 2.4 mg/dL (ref 1.5–2.5)

## 2014-01-11 MED ORDER — POTASSIUM CHLORIDE 10 MEQ/100ML IV SOLN
10.0000 meq | INTRAVENOUS | Status: AC
  Administered 2014-01-11 (×3): 10 meq via INTRAVENOUS
  Filled 2014-01-11: qty 100

## 2014-01-11 MED ORDER — NITROGLYCERIN 2 % TD OINT
0.5000 [in_us] | TOPICAL_OINTMENT | Freq: Four times a day (QID) | TRANSDERMAL | Status: DC
  Filled 2014-01-11 (×2): qty 30

## 2014-01-11 MED ORDER — MORPHINE SULFATE 2 MG/ML IJ SOLN
1.0000 mg | INTRAMUSCULAR | Status: DC | PRN
  Administered 2014-01-13: 1 mg via INTRAVENOUS
  Filled 2014-01-11: qty 1

## 2014-01-11 MED ORDER — INSULIN GLARGINE 100 UNIT/ML ~~LOC~~ SOLN
10.0000 [IU] | Freq: Every day | SUBCUTANEOUS | Status: DC
  Administered 2014-01-11 – 2014-01-13 (×2): 10 [IU] via SUBCUTANEOUS
  Filled 2014-01-11 (×3): qty 0.1

## 2014-01-11 MED ORDER — ACETAMINOPHEN 160 MG/5ML PO SOLN
650.0000 mg | Freq: Four times a day (QID) | ORAL | Status: DC | PRN
  Administered 2014-01-11 – 2014-01-12 (×4): 650 mg via ORAL
  Filled 2014-01-11 (×4): qty 20.3

## 2014-01-11 MED ORDER — METOPROLOL TARTRATE 1 MG/ML IV SOLN
5.0000 mg | Freq: Four times a day (QID) | INTRAVENOUS | Status: DC
  Administered 2014-01-11 – 2014-01-14 (×11): 5 mg via INTRAVENOUS
  Filled 2014-01-11 (×15): qty 5

## 2014-01-11 MED ORDER — PANTOPRAZOLE SODIUM 40 MG IV SOLR
40.0000 mg | Freq: Every day | INTRAVENOUS | Status: DC
  Administered 2014-01-11 – 2014-01-13 (×2): 40 mg via INTRAVENOUS
  Filled 2014-01-11 (×4): qty 40

## 2014-01-11 NOTE — Evaluation (Signed)
Clinical/Bedside Swallow Evaluation Patient Details  Name: David Lane MRN: [id number] Date of Birth: [birth year]  Today's Date: 01/11/2014 Time: 3149-7026 SLP Time Calculation (min): 45 min  Past Medical History:  Past Medical History  Diagnosis Date  . Hyperlipemia   . Hypertension   . Pancreatitis 2000's  . Hepatic lesion 02/04/11  . GERD (gastroesophageal reflux disease)   . CAD (coronary artery disease)     last cath by [facility] DR.  Mihai Croitoru showing  some  disease involving LCX and small size of Diag   . NSTEMI (non-ST elevated myocardial infarction) 11/21/2009  . Shortness of breath   . CHF exacerbation, due to diastolic dysfunction 37/85/8850  . Ischemic cardiomyopathy     EF 35-40%  . Liver hemangioma   . Type II diabetes mellitus    Past Surgical History:  Past Surgical History  Procedure Laterality Date  . Incision and drainage abscess anal  1970's  . Cardiac catheterization  2011    minimal disease, medical management  . Coronary angioplasty with stent placement  07/14/2012    successful PCI & stenting of mid LAD & PDA off the dominant CX   HPI:  68 year old male admitted 01/09/14 due to sore throat.  PMH significant for DM, CAD, HTN, GERD, NSTEMI.  Pt dx with 15x53mm abscess near the left aryepiglottic fold, which is adversely affecting pt swallow function and safety. BSE ordered to evaluate.   Assessment / Plan / Recommendation Clinical Impression  Pt completed oral care after set up.  Oral motor structure and function appear adequate. Prior to po trials, pt was noted to expectorate several boluses of phlegm/saliva into a cup, raising suspicion for poor secretion management. Pt was given small ice chip via spoon. Multiple swallows were elicited, however, no cough response noted.  Small sips of water via cup resulted in change in voice quality and weak throat clearing.  1/4 tsp puree given, with immediate change in voice quality, multiple swallows., and throat  clearing.  At this time, it appears as if pt is not safe for any po intake, based on bedside presentation. Objective study is recommended prior to initiating po, but will defer to MD regarding the timing of this assessment.  Either FEES or MBS would be an appropriate choice for objective study, however, in discussing options with pt, he prefers MBS.  Wil discuss with MD    Aspiration Risk  Severe    Diet Recommendation NPO   Medication Administration: Via alternative means    Other  Recommendations Recommended Consults: MBS;FEES Oral Care Recommendations: Oral care BID Other Recommendations: Have oral suction available   Follow Up Recommendations   (TBD)    Frequency and Duration min 1 x/week  1 week   Pertinent Vitals/Pain VSS, no pain reported    SLP Swallow Goals  see care plan   Swallow Study Prior Functional Status   No history of dysphagia prior to current issue    General Date of Onset: 01/09/14 HPI: 68 year old male admitted 01/09/14 due to sore throat.  PMH significant for DM, CAD, HTN, GERD, NSTEMI.  Pt dx with 15x54mm abscess near the left aryepiglottic fold, which is adversely affecting pt swallow function and safety. BSE ordered to evaluate. Type of Study: Bedside swallow evaluation Previous Swallow Assessment: n/a Diet Prior to this Study: NPO Temperature Spikes Noted: No Respiratory Status: Nasal cannula History of Recent Intubation: No Behavior/Cognition: Alert;Cooperative;Pleasant mood Oral Cavity - Dentition: Dentures, top;Dentures, bottom Self-Feeding Abilities: Able  to feed self Patient Positioning: Upright in chair Baseline Vocal Quality:  (variable, clear/thick) Volitional Cough: Weak Volitional Swallow: Able to elicit    Oral/Motor/Sensory Function Overall Oral Motor/Sensory Function: Appears within functional limits for tasks assessed   Ice Chips Ice chips: Within functional limits Presentation: Spoon   Thin Liquid Thin Liquid:  Impaired Presentation: Cup;Straw Pharyngeal  Phase Impairments: Suspected delayed Swallow;Decreased hyoid-laryngeal movement;Multiple swallows;Throat Clearing - Immediate;Wet Vocal Quality    Nectar Thick Nectar Thick Liquid: Not tested   Honey Thick Honey Thick Liquid: Not tested   Puree Puree: Impaired Presentation: Self Fed;Spoon Pharyngeal Phase Impairments: Suspected delayed Swallow;Decreased hyoid-laryngeal movement;Multiple swallows;Throat Clearing - Immediate;Wet Vocal Quality   Solid   GO    Marge Vandermeulen B. Quentin [facility], CCC-SLP [phone] Solid: Not tested       Lorre Nick 01/11/2014,9:41 AM

## 2014-01-11 NOTE — Progress Notes (Signed)
Inpatient Diabetes Program Recommendations  AACE/ADA: New Consensus Statement on Inpatient Glycemic Control (2013)  Target Ranges:  Prepandial:   less than 140 mg/dL      Peak postprandial:   less than 180 mg/dL (1-2 hours)      Critically ill patients:  140 - 180 mg/dL  Inpatient Diabetes Program Recommendations Insulin - Basal: Pt states he is not on an insulin pump at home.  Per previous note, pt was talking about using a V-GO pump which delivers only bolus insulin in addition to the lantus he takes at home.- which he states was 40 units at home  Noted that V-Go insulin pump documented as pt home therapy. Pt states he has never started using the pump yet.  But he does positively assert that he takes 40 units lantus at home ("one shot a day" per patient).  Please add some lantus to insulin regimen of at  17 units while here to help normalize glucose in addition to correction ordered. Thank you, Rosita Kea, RN, CNS, Diabetes Coordinator [phone])

## 2014-01-11 NOTE — Progress Notes (Addendum)
[name] - Stepdown/ICU TEAM Progress Note  David Lane JKK:[id number] DOB: [birth year] DOA: 01/09/2014 PCP: [name], MD  Admit HPI / Brief Narrative: 68 y.o. M w/ a PMHx of labile diabetes type 2 on insulin pump, CAD, Hypertension, HLD, CAD S/P NSTEMI with stent placement in LAD/PDA, and ischemic cardiomyopathy who presented with worsening sore throat and throat swelling that started a couples of days prior to admission. He was seen in the ED and was prescribed antibiotics but he was not able to swallow the pills and vomited.   4/16 patient states throat has improved but still sore, still chokes whenever he attempts to consume water 4/17 feels "a little better today" - no more vomiting - no signif change in strength of voice or pain in throat  Assessment/Plan:  Left parapharyngeal phlegmon -continue clindamycin per ENT w/ no plans for intervention at this time as pt is clinically improving  -ask SLP to accomplish MBS asap to determine if safe to begin diet   Diabetes type 2 -CBG poorly controlled on IV dextrose - adjust tx regimen and follow - pt tells me he is NOT on an insuiln pump, but that he uses a once daily insulin pen only   Chronic Systolic and diastolic CHF -EF improved on patient's most recent echocardiogram 08/01/2013 -continue home meds substituted in IV/cutaoneous form until cleared for oral intake   HTN -BP poorly controlled - adjust tx and follow   Hypokalemia -due to lack of oral intake - check Mg - replace IV and follow  HLD -Continue Zocor 40 mg daily when safe for oral intake   Pancreatic insufficiency -Continue Pancrease QAC when oral intake begins   Anxiety -Currently not an issue  Code Status: FULL Family Communication: no family present at time of exam Disposition Plan: cleared for transfer to med bed - await MBS to determine if diet can be advanced - timing of d/c home to be determined along with ENT  Culture MRSA by PCR  negative Rapid strep screen negative  Consultants: Dr. Evelena Peat Teoh(ENT)  Procedures/Significant Events: CT soft tissue neck with contrast 01/09/2014 -large phlegmon extending from the left parapharyngeal space to the  left aryepiglottic fold and left lateral aspect of epiglottis with  left-to-right mass effect upon the hypopharynx and narrowing of the  airway at the level of the false cords.  Echocardiogram 08/01/2013 Left ventricle: The cavity size was normal. Mild concentric hypertrophy. - LVEF=  50% to 55%. Wall motion was normal;  - Aortic valve: Mild regurgitation. - Mitral valve: Mild regurgitation.  Antibiotics: Clindamycin 4/15 >>  DVT prophylaxis: Lovenox 40 mg daily  Objective: VITAL SIGNS:  Blood pressure 156/80, pulse 86, temperature 98.3 F (36.8 C), temperature source Oral, resp. rate 16, height 6' (1.829 m), weight 81.058 kg (178 lb 11.2 oz), SpO2 100.00%.  Intake/Output Summary (Last 24 hours) at 01/11/14 1449 Last data filed at 01/11/14 0416  Gross per 24 hour  Intake     50 ml  Output    650 ml  Net   -600 ml   Exam: General: A/O x4, mild discomfort secondary to sore throat and inability to swallow. Lungs: Clear to auscultation bilaterally without wheezes or crackles Cardiovascular: Regular rate and rhythm without murmur gallop or rub  Abdomen: Nontender, nondistended, soft, bowel sounds positive, no rebound, no ascites, no appreciable mass Extremities: No significant cyanosis, clubbing, or edema bilateral lower extremities  Data Reviewed: Basic Metabolic Panel:  Recent Labs Lab 01/08/14 1345 01/09/14 1230  01/10/14 0735 01/11/14 0357  NA 137 139 141 140  K 3.8 3.8 4.1 3.4*  CL 99 102 101 102  CO2 22 24 23 24   GLUCOSE 300* 255* 241* 222*  BUN 16 19 22  26*  CREATININE 0.90 0.90 0.84 0.80  CALCIUM 9.3 8.8 8.6 8.5  MG  --   --   --  2.4   Liver Function Tests:  Recent Labs Lab 01/10/14 0735 01/11/14 0357  AST 15 14  ALT 18 16  ALKPHOS 67  80  BILITOT 0.7 0.6  PROT 6.5 6.5  ALBUMIN 2.9* 2.6*   CBC:  Recent Labs Lab 01/08/14 1345 01/09/14 1230 01/10/14 0735 01/11/14 0357  WBC 22.1* 19.4* 15.1* 11.5*  NEUTROABS 18.7* 16.1* 12.4* 9.1*  HGB 13.2 12.0* 11.4* 11.1*  HCT 37.0* 34.4* 32.5* 32.3*  MCV 89.8 90.5 88.8 89.7  PLT 207 185 183 234   CBG:  Recent Labs Lab 01/10/14 2027 01/11/14 0011 01/11/14 0414 01/11/14 0755 01/11/14 1200  GLUCAP 183* 175* 233* 187* 211*    Recent Results (from the past 240 hour(s))  RAPID STREP SCREEN     Status: None   Collection Time    01/08/14  1:35 PM      Result Value Ref Range Status   Streptococcus, Group A Screen (Direct) NEGATIVE  NEGATIVE Final  CULTURE, GROUP A STREP     Status: None   Collection Time    01/08/14  1:35 PM      Result Value Ref Range Status   Specimen Description THROAT   Final   Special Requests NONE   Final   Culture     Final   Value: No Beta Hemolytic Streptococci Isolated     Performed at [facility]   Report Status 01/10/2014 FINAL   Final  MRSA PCR SCREENING     Status: None   Collection Time    01/09/14  4:38 PM      Result Value Ref Range Status   MRSA by PCR NEGATIVE  NEGATIVE Final   Comment:            The GeneXpert MRSA Assay (FDA     approved for NASAL specimens     only), is one component of a     comprehensive MRSA colonization     surveillance program. It is not     intended to diagnose MRSA     infection nor to guide or     monitor treatment for     MRSA infections.     Studies:  Recent x-ray studies have been reviewed in detail by the Attending Physician  Scheduled Meds:  Scheduled Meds: . clindamycin (CLEOCIN) IV  600 mg Intravenous 4 times per day  . enoxaparin (LOVENOX) injection  40 mg Subcutaneous Q24H  . insulin aspart  0-15 Units Subcutaneous 6 times per day  . isosorbide mononitrate  30 mg Oral Daily  . lipase/protease/amylase  2 capsule Oral TID WC  . magic mouthwash w/lidocaine  15 mL Oral  Once  . metoprolol  2.5 mg Intravenous 4 times per day  . pantoprazole  40 mg Oral Daily  . simvastatin  40 mg Oral QHS  . sodium chloride  3 mL Intravenous Q12H    Time spent on care of this patient: 35 mins   [name] , MD   [facility] Office  [phone]  On-Call/Text Page:      [email or web address]      password [facility]  If 7PM-7AM, please contact night-coverage [email or web address] Password [email or web address] 01/11/2014, 2:49 PM   LOS: 2 days

## 2014-01-11 NOTE — Progress Notes (Signed)
Subjective: Pt reports feeling better.  His left neck tenderness has significantly improved.  Still has some odynophagia.   Objective: Vital signs in last 24 hours: Temp:  [98 F (36.7 C)-98.3 F (36.8 C)] 98.3 F (36.8 C) (04/17 1200) Pulse Rate:  [78-108] 86 (04/17 1200) Resp:  [10-29] 16 (04/17 1200) BP: (127-163)/(51-81) 156/80 mmHg (04/17 1200) SpO2:  [96 %-100 %] 100 % (04/17 1200) Weight:  [178 lb 11.2 oz (81.058 kg)] 178 lb 11.2 oz (81.058 kg) (04/17 0415)  Physical Exam  Constitutional: He is oriented to person, place, and time. He appears well-developed and well-nourished.  HENT:  Head: Normocephalic.  Ears: EAC and TM wnl.  Eyes: Conjunctivae are normal. Pupils are equal, round, and reactive to light.  [place]/OC: Normal mucosa. No uvula deviation. No mass or lesion is noted.  Neck: Normal range of motion. The left neck tenderness has significantly improved. No obvious skin changes. No stridor.  Cardiovascular: Normal rate, regular rhythm and normal heart sounds.  Pulmonary/Chest: Effort normal and breath sounds normal.  Neurological: He is alert and oriented to person, place, and time. He has normal reflexes.  Skin: Skin is warm.  Psychiatric: He has a normal mood and affect.    Recent Labs  01/10/14 0735 01/11/14 0357  WBC 15.1* 11.5*  HGB 11.4* 11.1*  HCT 32.5* 32.3*  PLT 183 234    Recent Labs  01/10/14 0735 01/11/14 0357  NA 141 140  K 4.1 3.4*  CL 101 102  CO2 23 24  GLUCOSE 241* 222*  BUN 22 26*  CREATININE 0.84 0.80  CALCIUM 8.6 8.5    Medications:  I have reviewed the patient's current medications. Scheduled: . clindamycin (CLEOCIN) IV  600 mg Intravenous 4 times per day  . enoxaparin (LOVENOX) injection  40 mg Subcutaneous Q24H  . insulin aspart  0-15 Units Subcutaneous 6 times per day  . isosorbide mononitrate  30 mg Oral Daily  . lipase/protease/amylase  2 capsule Oral TID WC  . magic mouthwash w/lidocaine  15 mL Oral Once  . metoprolol   2.5 mg Intravenous 4 times per day  . pantoprazole  40 mg Oral Daily  . simvastatin  40 mg Oral QHS  . sodium chloride  3 mL Intravenous Q12H   JWJ:XBJYNW chloride, acetaminophen (TYLENOL) oral liquid 160 mg/5 mL, acetaminophen, HYDROcodone-acetaminophen, lipase/protease/amylase, morphine injection, nitroGLYCERIN, ondansetron (ZOFRAN) IV, ondansetron, sodium chloride  Assessment/Plan: Left parapharyngeal phlegmon. Clinically improved.  WBC continues to decrease. Continue with IV clindamycin. Will continue to monitor.  MBS as per speech pathologist.  Consider transferring pt to regular floor.       LOS: 2 days   Sui W Harald Quevedo 01/11/2014, 12:43 PM

## 2014-01-12 ENCOUNTER — Inpatient Hospital Stay (HOSPITAL_COMMUNITY): Payer: 59

## 2014-01-12 DIAGNOSIS — D72829 Elevated white blood cell count, unspecified: Secondary | ICD-10-CM

## 2014-01-12 LAB — BASIC METABOLIC PANEL
BUN: 18 mg/dL (ref 6–23)
CO2: 24 meq/L (ref 19–32)
Calcium: 8.5 mg/dL (ref 8.4–10.5)
Chloride: 108 mEq/L (ref 96–112)
Creatinine, Ser: 0.7 mg/dL (ref 0.50–1.35)
GFR calc Af Amer: >90 mL/min (ref >90)
GFR calc non Af Amer: >90 mL/min (ref >90)
GLUCOSE: 225 mg/dL — AB (ref 70–99)
POTASSIUM: 3.6 meq/L — AB (ref 3.7–5.3)
Sodium: 143 mEq/L (ref 137–147)

## 2014-01-12 LAB — GLUCOSE, CAPILLARY
GLUCOSE-CAPILLARY: 233 mg/dL — AB (ref 70–99)
GLUCOSE-CAPILLARY: 177 mg/dL — AB (ref 70–99)
GLUCOSE-CAPILLARY: 187 mg/dL — AB (ref 70–99)
Glucose-Capillary: 213 mg/dL — ABNORMAL HIGH (ref 70–99)
Glucose-Capillary: 229 mg/dL — ABNORMAL HIGH (ref 70–99)
Glucose-Capillary: 267 mg/dL — ABNORMAL HIGH (ref 70–99)

## 2014-01-12 LAB — CBC
HCT: 31.9 % — ABNORMAL LOW (ref 39.0–52.0)
HEMOGLOBIN: 10.9 g/dL — AB (ref 13.0–17.0)
MCH: 30.5 pg (ref 26.0–34.0)
MCHC: 34.2 g/dL (ref 30.0–36.0)
MCV: 89.4 fL (ref 78.0–100.0)
Platelets: 245 10*3/uL (ref 150–400)
RBC: 3.57 MIL/uL — ABNORMAL LOW (ref 4.22–5.81)
RDW: 13.5 % (ref 11.5–15.5)
WBC: 8.5 10*3/uL (ref 4.0–10.5)

## 2014-01-12 LAB — MAGNESIUM: MAGNESIUM: 2.2 mg/dL (ref 1.5–2.5)

## 2014-01-12 MED ORDER — ACETAMINOPHEN 160 MG/5ML PO SOLN
650.0000 mg | Freq: Four times a day (QID) | ORAL | Status: DC | PRN
  Administered 2014-01-12 – 2014-01-14 (×4): 650 mg via ORAL
  Filled 2014-01-12 (×4): qty 20.3

## 2014-01-12 MED ORDER — INSULIN ASPART 100 UNIT/ML ~~LOC~~ SOLN
0.0000 [IU] | Freq: Every day | SUBCUTANEOUS | Status: DC
  Administered 2014-01-13: 2 [IU] via SUBCUTANEOUS
  Administered 2014-01-14: 4 [IU] via SUBCUTANEOUS

## 2014-01-12 MED ORDER — HYDRALAZINE HCL 20 MG/ML IJ SOLN
10.0000 mg | Freq: Four times a day (QID) | INTRAMUSCULAR | Status: DC | PRN
Start: 2014-01-12 — End: 2014-01-15
  Administered 2014-01-13 – 2014-01-15 (×2): 10 mg via INTRAVENOUS
  Filled 2014-01-12 (×2): qty 1

## 2014-01-12 MED ORDER — INSULIN ASPART 100 UNIT/ML ~~LOC~~ SOLN
0.0000 [IU] | Freq: Three times a day (TID) | SUBCUTANEOUS | Status: DC
  Administered 2014-01-12: 8 [IU] via SUBCUTANEOUS
  Administered 2014-01-13: 5 [IU] via SUBCUTANEOUS
  Administered 2014-01-13: 8 [IU] via SUBCUTANEOUS
  Administered 2014-01-13 – 2014-01-14 (×2): 5 [IU] via SUBCUTANEOUS
  Administered 2014-01-14: 3 [IU] via SUBCUTANEOUS
  Administered 2014-01-14: 8 [IU] via SUBCUTANEOUS

## 2014-01-12 MED ORDER — AMLODIPINE BESYLATE 5 MG PO TABS
5.0000 mg | ORAL_TABLET | Freq: Every day | ORAL | Status: DC
  Administered 2014-01-13 – 2014-01-14 (×3): 5 mg via ORAL
  Filled 2014-01-12 (×5): qty 1

## 2014-01-12 NOTE — Progress Notes (Signed)
Progress Note  HUSAIN COSTABILE EUM:[id number] DOB: 1946-07-19 DOA: 01/09/2014 PCP: [name], MD  Admit HPI / Brief Narrative: 68 y.o. M w/ a PMHx of labile diabetes type 2 on insulin pump, CAD, Hypertension, HLD, CAD S/P NSTEMI with stent placement in LAD/PDA, and ischemic cardiomyopathy who presented with worsening sore throat and throat swelling that started a couples of days prior to admission. He was seen in the ED and was prescribed antibiotics but he was not able to swallow the pills and vomited.    Throat still hurting  Assessment/Plan:  Left parapharyngeal phlegmon -continue clindamycin per ENT w/ no plans for intervention at this time as pt is clinically improving  -ask SLP to accomplish MBS asap to determine if safe to begin diet (appears he has been getting PO meds, tylenol) - for modified barium swallow on 4/18  Diabetes type 2 -CBG poorly controlled on IV dextrose - adjust tx regimen and follow -  -NOT on an insuiln pump, but that he uses a once daily insulin pen only  - SSI, lantus 10 now--- will need to re-assess once eating and off D5 IVF  Chronic Systolic and diastolic CHF -EF improved on patient's most recent echocardiogram 08/01/2013 -continue home meds substituted in IV/cutaoneous form until cleared for oral intake   HTN -BP poorly controlled - adjust tx and follow   Hypokalemia -due to lack of oral intake  Mg ok  HLD -Continue Zocor 40 mg daily when safe for oral intake   Pancreatic insufficiency -Continue Pancrease QAC when oral intake begins   Anxiety -Currently not an issue  Code Status: FULL Family Communication: no family present at time of exam Disposition Plan: per ENT and once patient can swallow  Culture MRSA by PCR negative Rapid strep screen negative  Consultants: Dr. Evelena Peat Teoh(ENT)  Procedures/Significant Events: CT soft tissue neck with contrast 01/09/2014 -large phlegmon extending from the left parapharyngeal space to  the  left aryepiglottic fold and left lateral aspect of epiglottis with  left-to-right mass effect upon the hypopharynx and narrowing of the  airway at the level of the false cords.  Echocardiogram 08/01/2013 Left ventricle: The cavity size was normal. Mild concentric hypertrophy. - LVEF=  50% to 55%. Wall motion was normal;  - Aortic valve: Mild regurgitation. - Mitral valve: Mild regurgitation.  Antibiotics: Clindamycin 4/15 >>  DVT prophylaxis: Lovenox 40 mg daily  Objective: VITAL SIGNS:  Blood pressure 163/78, pulse 85, temperature 98.5 F (36.9 C), temperature source Oral, resp. rate 15, height 6' (1.829 m), weight 81.058 kg (178 lb 11.2 oz), SpO2 97.00%.  Intake/Output Summary (Last 24 hours) at 01/12/14 0848 Last data filed at 01/12/14 0729  Gross per 24 hour  Intake   2125 ml  Output    275 ml  Net   1850 ml   Exam: General: A/O x4, mild discomfort secondary to sore throat and inability to swallow. Lungs: Clear to auscultation bilaterally without wheezes or crackles Cardiovascular: Regular rate and rhythm without murmur gallop or rub  Abdomen: Nontender, nondistended, soft, bowel sounds positive, no rebound, no ascites, no appreciable mass Extremities: No significant cyanosis, clubbing, or edema bilateral lower extremities  Data Reviewed: Basic Metabolic Panel:  Recent Labs Lab 01/08/14 1345 01/09/14 1230 01/10/14 0735 01/11/14 0357 01/12/14 0555  NA 137 139 141 140 143  K 3.8 3.8 4.1 3.4* 3.6*  CL 99 102 101 102 108  CO2 22 24 23 24 24   GLUCOSE 300* 255* 241* 222* 225*  BUN  16 19 22  26* 18  CREATININE 0.90 0.90 0.84 0.80 0.70  CALCIUM 9.3 8.8 8.6 8.5 8.5  MG  --   --   --  2.4 2.2   Liver Function Tests:  Recent Labs Lab 01/10/14 0735 01/11/14 0357  AST 15 14  ALT 18 16  ALKPHOS 67 80  BILITOT 0.7 0.6  PROT 6.5 6.5  ALBUMIN 2.9* 2.6*   CBC:  Recent Labs Lab 01/08/14 1345 01/09/14 1230 01/10/14 0735 01/11/14 0357 01/12/14 0555  WBC  22.1* 19.4* 15.1* 11.5* 8.5  NEUTROABS 18.7* 16.1* 12.4* 9.1*  --   HGB 13.2 12.0* 11.4* 11.1* 10.9*  HCT 37.0* 34.4* 32.5* 32.3* 31.9*  MCV 89.8 90.5 88.8 89.7 89.4  PLT 207 185 183 234 245   CBG:  Recent Labs Lab 01/11/14 1721 01/11/14 1926 01/11/14 2350 01/12/14 0334 01/12/14 0728  GLUCAP 210* 198* 232* 233* 177*    Recent Results (from the past 240 hour(s))  RAPID STREP SCREEN     Status: None   Collection Time    01/08/14  1:35 PM      Result Value Ref Range Status   Streptococcus, Group A Screen (Direct) NEGATIVE  NEGATIVE Final  CULTURE, GROUP A STREP     Status: None   Collection Time    01/08/14  1:35 PM      Result Value Ref Range Status   Specimen Description THROAT   Final   Special Requests NONE   Final   Culture     Final   Value: No Beta Hemolytic Streptococci Isolated     Performed at [facility]   Report Status 01/10/2014 FINAL   Final  MRSA PCR SCREENING     Status: None   Collection Time    01/09/14  4:38 PM      Result Value Ref Range Status   MRSA by PCR NEGATIVE  NEGATIVE Final   Comment:            The GeneXpert MRSA Assay (FDA     approved for NASAL specimens     only), is one component of a     comprehensive MRSA colonization     surveillance program. It is not     intended to diagnose MRSA     infection nor to guide or     monitor treatment for     MRSA infections.       Scheduled Meds:  Scheduled Meds: . clindamycin (CLEOCIN) IV  600 mg Intravenous 4 times per day  . enoxaparin (LOVENOX) injection  40 mg Subcutaneous Q24H  . insulin aspart  0-15 Units Subcutaneous 6 times per day  . insulin glargine  10 Units Subcutaneous QHS  . magic mouthwash w/lidocaine  15 mL Oral Once  . metoprolol  5 mg Intravenous 4 times per day  . nitroGLYCERIN  0.5 inch Topical 4 times per day  . pantoprazole (PROTONIX) IV  40 mg Intravenous QHS    Time spent on care of this patient: 35 mins   Geradine Girt , DO  [facility] [phone]   On-Call/Text Page:      [email or web address]      password [email or web address]  If 7PM-7AM, please contact night-coverage [email or web address] Password [email or web address] 01/12/2014, 8:48 AM   LOS: 3 days

## 2014-01-12 NOTE — Procedures (Signed)
Objective Swallowing Evaluation: Modified Barium Swallowing Study  Patient Details  Name: David Lane MRN: [id number] Date of Birth: 1945/10/22  Today's Date: 01/12/2014 Time: 1240-1305 SLP Time Calculation (min): 25 min  Past Medical History:  Past Medical History  Diagnosis Date  . Hyperlipemia   . Hypertension   . Pancreatitis 2000's  . Hepatic lesion 02/04/11  . GERD (gastroesophageal reflux disease)   . CAD (coronary artery disease)     last cath by [facility] DR.  Mihai Croitoru showing  some  disease involving LCX and small size of Diag   . NSTEMI (non-ST elevated myocardial infarction) 11/21/2009  . Shortness of breath   . CHF exacerbation, due to diastolic dysfunction 53/66/4403  . Ischemic cardiomyopathy     EF 35-40%  . Liver hemangioma   . Type II diabetes mellitus    Past Surgical History:  Past Surgical History  Procedure Laterality Date  . Incision and drainage abscess anal  1970's  . Cardiac catheterization  2011    minimal disease, medical management  . Coronary angioplasty with stent placement  07/14/2012    successful PCI & stenting of mid LAD & PDA off the dominant CX   HPI:  68 year old male admitted 01/09/14 due to sore throat.  PMH significant for DM, CAD, HTN, GERD, NSTEMI.  Pt dx with 15x50mm abscess near the left aryepiglottic fold, which is adversely affecting pt swallow function and safety. CT soft tissue neck with contrast 01/09/2014: large phlegmon extending from the left parapharyngeal space to the left aryepiglottic fold and left lateral aspect of epiglottis with left-to-right mass effect upon the hypopharynx and narrowing of the airway at the level of the false cords.     Assessment / Plan / Recommendation Clinical Impression  Dysphagia Diagnosis: Moderate pharyngeal phase dysphagia;Severe pharyngeal phase dysphagia Clinical impression: Patient presents with a moderate-severe structural based pharyngeal dysphagia. Location of phlegmon negatively  impacting deflection of epiglottis, laryngeal closure, and UES relaxation, resulting in penetration and/or aspiration of all tested consistencies and pharyngeal residuals. Attempt at various head postures unsuccessful at facilitating better airway protection or pharyngeal clearance. Patient independently compensating by completing multiple spontaneous dry swallows, which in addition to some spontaneous and some cued throat clearing,  is successful to reduce pharyngeal residuals and clear penetrates/aspirates. Suspect some degree of aspiration of secretions noted as well given wet vocal quality at baseline which patient also appears to be managing with spontaneous throat clearing. Education complete with patient regarding results. Although patient hesitant to initiate a diet, recommend dysphagia 1 (puree) with thin liquids with aspiration precautions. Hopeful to see continued clinical improvements at bedside with ability to further advance diet without repeat objective evaluation as treatment course continues. Will f/u.     Treatment Recommendation  Therapy as outlined in treatment plan below    Diet Recommendation Dysphagia 1 (Puree);Thin liquid   Liquid Administration via: Cup;No straw Medication Administration: Crushed with puree Supervision: Patient able to self feed;Full supervision/cueing for compensatory strategies Compensations: Slow rate;Small sips/bites;Multiple dry swallows after each bite/sip;Clear throat after each swallow Postural Changes and/or Swallow Maneuvers: Seated upright 90 degrees    Other  Recommendations Oral Care Recommendations: Oral care BID Other Recommendations: Have oral suction available   Follow Up Recommendations   (TBD)    Frequency and Duration min 2x/week  1 week           General Date of Onset: 01/09/14 HPI: 68 year old male admitted 01/09/14 due to sore throat.  PMH  significant for DM, CAD, HTN, GERD, NSTEMI.  Pt dx with 15x29mm abscess near the left  aryepiglottic fold, which is adversely affecting pt swallow function and safety. CT soft tissue neck with contrast 01/09/2014: large phlegmon extending from the left parapharyngeal space to the left aryepiglottic fold and left lateral aspect of epiglottis with left-to-right mass effect upon the hypopharynx and narrowing of the airway at the level of the false cords. Type of Study: Modified Barium Swallowing Study Reason for Referral: Objectively evaluate swallowing function Previous Swallow Assessment: bedside swallow eval complete this admission 4/17 indicated s/s of decreased airway protection across consistencies Diet Prior to this Study: NPO Temperature Spikes Noted: No Respiratory Status: Room air History of Recent Intubation: No Behavior/Cognition: Alert;Cooperative;Pleasant mood Oral Cavity - Dentition: Dentures, top;Dentures, bottom Oral Motor / Sensory Function: Within functional limits Self-Feeding Abilities: Able to feed self Patient Positioning: Upright in chair Baseline Vocal Quality: Wet (clears with independent throat clear and dry swallow) Volitional Cough: Strong Volitional Swallow: Able to elicit Anatomy: Within functional limits (at baseline ) Pharyngeal Secretions: Not observed secondary MBS (although suspect due to wet vocal quality)    Reason for Referral Objectively evaluate swallowing function   Oral Phase Oral Preparation/Oral Phase Oral Phase: WFL   Pharyngeal Phase Pharyngeal Phase Pharyngeal Phase: Impaired Pharyngeal - Nectar Pharyngeal - Nectar Cup: Reduced anterior laryngeal mobility;Reduced airway/laryngeal closure;Reduced epiglottic inversion;Penetration/Aspiration during swallow;Penetration/Aspiration after swallow;Pharyngeal residue - valleculae;Pharyngeal residue - pyriform sinuses;Pharyngeal residue - posterior pharnyx Penetration/Aspiration details (nectar cup): Material enters airway, CONTACTS cords then ejected out Pharyngeal - Thin Pharyngeal -  Thin Cup: Reduced anterior laryngeal mobility;Reduced airway/laryngeal closure;Reduced epiglottic inversion;Penetration/Aspiration during swallow;Penetration/Aspiration after swallow;Pharyngeal residue - valleculae;Pharyngeal residue - pyriform sinuses;Pharyngeal residue - posterior pharnyx Penetration/Aspiration details (thin cup): Material enters airway, passes BELOW cords then ejected out;Material enters airway, CONTACTS cords then ejected out Pharyngeal - Solids Pharyngeal - Puree: Reduced anterior laryngeal mobility;Reduced airway/laryngeal closure;Reduced epiglottic inversion;Penetration/Aspiration during swallow;Penetration/Aspiration after swallow;Pharyngeal residue - valleculae;Pharyngeal residue - pyriform sinuses;Pharyngeal residue - posterior pharnyx Penetration/Aspiration details (puree): Material enters airway, CONTACTS cords then ejected out  Cervical Esophageal Phase    GO    Cervical Esophageal Phase Cervical Esophageal Phase: Impaired Cervical Esophageal Phase - Nectar Nectar Cup: Reduced cricopharyngeal relaxation Cervical Esophageal Phase - Thin Thin Cup: Reduced cricopharyngeal relaxation Cervical Esophageal Phase - Solids Puree: Reduced cricopharyngeal relaxation        Gabriel Rainwater MA, CCC-SLP [phone]  Shemeka Wardle Meryl Maame Dack 01/12/2014, 1:41 PM

## 2014-01-12 NOTE — Progress Notes (Signed)
Subjective: The patient is still complaining of slight dysphagia and difficulty with his swallowing. However, his neck tenderness has significantly improved.  Objective: Vital signs in last 24 hours: Temp:  [98.3 F (36.8 C)-98.7 F (37.1 C)] 98.7 F (37.1 C) (04/18 1459) Pulse Rate:  [85-96] 85 (04/18 1459) Resp:  [15-16] 16 (04/18 1459) BP: (163-186)/(78-86) 179/86 mmHg (04/18 1459) SpO2:  [97 %-100 %] 100 % (04/18 1459)  Physical Exam  Constitutional: He is oriented to person, place, and time. He appears well-developed and well-nourished.  HENT:  Head: Normocephalic.  Ears: EAC and TM wnl.  Eyes: Conjunctivae are normal. Pupils are equal, round, and reactive to light.  [place]/OC: Normal mucosa. No uvula deviation. No mass or lesion is noted.  Neck: Normal range of motion. The left neck tenderness has mostly resolved. No obvious skin changes. No stridor.  Cardiovascular: Normal rate, regular rhythm and normal heart sounds.  Pulmonary/Chest: Effort normal and breath sounds normal.  Neurological: He is alert and oriented to person, place, and time. He has normal reflexes.  Skin: Skin is warm.  Psychiatric: He has a normal mood and affect.    Recent Labs  01/11/14 0357 01/12/14 0555  WBC 11.5* 8.5  HGB 11.1* 10.9*  HCT 32.3* 31.9*  PLT 234 245    Recent Labs  01/11/14 0357 01/12/14 0555  NA 140 143  K 3.4* 3.6*  CL 102 108  CO2 24 24  GLUCOSE 222* 225*  BUN 26* 18  CREATININE 0.80 0.70  CALCIUM 8.5 8.5    Medications:  I have reviewed the patient's current medications. Scheduled: . amLODipine  5 mg Oral QHS  . clindamycin (CLEOCIN) IV  600 mg Intravenous 4 times per day  . enoxaparin (LOVENOX) injection  40 mg Subcutaneous Q24H  . insulin aspart  0-15 Units Subcutaneous TID WC  . insulin aspart  0-5 Units Subcutaneous QHS  . insulin glargine  10 Units Subcutaneous QHS  . magic mouthwash w/lidocaine  15 mL Oral Once  . metoprolol  5 mg Intravenous 4 times per  day  . nitroGLYCERIN  0.5 inch Topical 4 times per day  . pantoprazole (PROTONIX) IV  40 mg Intravenous QHS   OEU:MPNTIRWERXVQM (TYLENOL) oral liquid 160 mg/5 mL, acetaminophen, hydrALAZINE, morphine injection, nitroGLYCERIN, ondansetron (ZOFRAN) IV  Assessment/Plan: Left parapharyngeal phlegmon. Clinically improved. WBC has normalized. On dysphagia 1 diet.  Consider switching to po clindamycin in anticipation of discharge.   LOS: 3 days   David Lane 01/12/2014, 6:52 PM

## 2014-01-13 LAB — CBC
HCT: 34.5 % — ABNORMAL LOW (ref 39.0–52.0)
Hemoglobin: 12 g/dL — ABNORMAL LOW (ref 13.0–17.0)
MCH: 31.2 pg (ref 26.0–34.0)
MCHC: 34.8 g/dL (ref 30.0–36.0)
MCV: 89.6 fL (ref 78.0–100.0)
Platelets: 282 10*3/uL (ref 150–400)
RBC: 3.85 MIL/uL — ABNORMAL LOW (ref 4.22–5.81)
RDW: 13.4 % (ref 11.5–15.5)
WBC: 8.8 10*3/uL (ref 4.0–10.5)

## 2014-01-13 LAB — BASIC METABOLIC PANEL
BUN: 10 mg/dL (ref 6–23)
CO2: 19 mEq/L (ref 19–32)
Calcium: 8.5 mg/dL (ref 8.4–10.5)
Chloride: 104 mEq/L (ref 96–112)
Creatinine, Ser: 0.6 mg/dL (ref 0.50–1.35)
GLUCOSE: 233 mg/dL — AB (ref 70–99)
POTASSIUM: 3.8 meq/L (ref 3.7–5.3)
Sodium: 141 mEq/L (ref 137–147)

## 2014-01-13 LAB — GLUCOSE, CAPILLARY
GLUCOSE-CAPILLARY: 216 mg/dL — AB (ref 70–99)
Glucose-Capillary: 211 mg/dL — ABNORMAL HIGH (ref 70–99)
Glucose-Capillary: 246 mg/dL — ABNORMAL HIGH (ref 70–99)
Glucose-Capillary: 299 mg/dL — ABNORMAL HIGH (ref 70–99)

## 2014-01-13 LAB — CLOSTRIDIUM DIFFICILE BY PCR: Toxigenic C. Difficile by PCR: NEGATIVE

## 2014-01-13 MED ORDER — CLINDAMYCIN HCL 300 MG PO CAPS
300.0000 mg | ORAL_CAPSULE | Freq: Four times a day (QID) | ORAL | Status: DC
  Filled 2014-01-13: qty 1

## 2014-01-13 MED ORDER — CLINDAMYCIN PALMITATE HCL 75 MG/5ML PO SOLR
300.0000 mg | Freq: Four times a day (QID) | ORAL | Status: DC
  Administered 2014-01-13 – 2014-01-15 (×8): 300 mg via ORAL
  Filled 2014-01-13 (×11): qty 20

## 2014-01-13 MED ORDER — OXYCODONE HCL 5 MG/5ML PO SOLN: 5.0000 mg | ORAL | Status: DC | PRN

## 2014-01-13 MED ORDER — PANTOPRAZOLE SODIUM 40 MG PO TBEC
40.0000 mg | DELAYED_RELEASE_TABLET | Freq: Every day | ORAL | Status: DC
  Administered 2014-01-13 – 2014-01-14 (×2): 40 mg via ORAL
  Filled 2014-01-13 (×2): qty 1

## 2014-01-13 MED ORDER — INSULIN GLARGINE 100 UNIT/ML ~~LOC~~ SOLN
12.0000 [IU] | Freq: Every day | SUBCUTANEOUS | Status: DC
  Administered 2014-01-13: 12 [IU] via SUBCUTANEOUS
  Filled 2014-01-13 (×3): qty 0.12

## 2014-01-13 MED ORDER — CLINDAMYCIN PALMITATE HCL 75 MG/5ML PO SOLR: 300.0000 mg | Freq: Four times a day (QID) | ORAL | Status: DC

## 2014-01-13 NOTE — Progress Notes (Signed)
Subjective: No significant change.  Mild dysphagia and sore throat.  Neck pain significantly improved.  Objective: Vital signs in last 24 hours: Temp:  [98.3 F (36.8 C)-98.7 F (37.1 C)] 98.4 F (36.9 C) (04/19 0533) Pulse Rate:  [83-87] 84 (04/19 0533) Resp:  [16-18] 16 (04/19 0533) BP: (168-186)/(73-86) 178/82 mmHg (04/19 0830) SpO2:  [98 %-100 %] 100 % (04/19 0533)  Physical Exam  Constitutional: He is oriented to person, place, and time. He appears well-developed and well-nourished.  HENT:  Head: Normocephalic.  Ears: EAC and TM wnl.  Eyes: Conjunctivae are normal. Pupils are equal, round, and reactive to light.  [place]/OC: Normal mucosa. No uvula deviation. No mass or lesion is noted.  Neck: Normal range of motion. The left neck tenderness has mostly resolved. No obvious skin changes. No stridor.  Cardiovascular: Normal rate, regular rhythm and normal heart sounds.  Pulmonary/Chest: Effort normal and breath sounds normal.  Neurological: He is alert and oriented to person, place, and time. He has normal reflexes.  Skin: Skin is warm.  Psychiatric: He has a normal mood and affect.    Recent Labs  01/12/14 0555 01/13/14 0841  WBC 8.5 8.8  HGB 10.9* 12.0*  HCT 31.9* 34.5*  PLT 245 282    Recent Labs  01/11/14 0357 01/12/14 0555  NA 140 143  K 3.4* 3.6*  CL 102 108  CO2 24 24  GLUCOSE 222* 225*  BUN 26* 18  CREATININE 0.80 0.70  CALCIUM 8.5 8.5    Medications:  I have reviewed the patient's current medications. Scheduled: . amLODipine  5 mg Oral QHS  . clindamycin (CLEOCIN) IV  600 mg Intravenous 4 times per day  . enoxaparin (LOVENOX) injection  40 mg Subcutaneous Q24H  . insulin aspart  0-15 Units Subcutaneous TID WC  . insulin aspart  0-5 Units Subcutaneous QHS  . insulin glargine  10 Units Subcutaneous QHS  . magic mouthwash w/lidocaine  15 mL Oral Once  . metoprolol  5 mg Intravenous 4 times per day  . nitroGLYCERIN  0.5 inch Topical 4 times per day   . pantoprazole (PROTONIX) IV  40 mg Intravenous QHS   WPV:XYIAXKPVVZSMO (TYLENOL) oral liquid 160 mg/5 mL, acetaminophen, hydrALAZINE, morphine injection, nitroGLYCERIN, ondansetron (ZOFRAN) IV  Assessment/Plan: Left parapharyngeal phlegmon. Continue to improve clinically.  WBC has normalized. On dysphagia 1 diet. Consider switching to po clindamycin in anticipation of discharge.  If discharge home tomorrow, pt may f/u in my office in 1 week.      LOS: 4 days   Sui W Gracie Gupta 01/13/2014, 9:40 AM

## 2014-01-13 NOTE — Progress Notes (Signed)
Chart reviewed.    Progress Note  David Lane IOE:[id number] DOB: 26-Jan-1946 DOA: 01/09/2014 PCP: [name], MD  Admit HPI / Brief Narrative: 68 y.o. M w/ a PMHx of labile diabetes type 2 on insulin pump, CAD, Hypertension, HLD, CAD S/P NSTEMI with stent placement in LAD/PDA, and ischemic cardiomyopathy who presented with worsening sore throat and throat swelling that started a couples of days prior to admission. He was seen in the ED and was prescribed antibiotics but he was not able to swallow the pills and vomited.   Assessment/Plan:  Left parapharyngeal phlegmon Change to po clindamycin, saline lock and monitor  Diabetes type 2 Poorly controlled. Increase lantus and d/c dextrose  Chronic Systolic and diastolic CHF compensated  HTN -BP poorly controlled - adjust tx and follow   Hypokalemia -due to lack of oral intake  Mg ok  HLD -Continue Zocor 40 mg daily when safe for oral intake   Pancreatic insufficiency -Continue Pancrease QAC when oral intake begins   Anxiety -Currently not an issue  Code Status: FULL Family Communication: no family present at time of exam Disposition Plan: per ENT and once patient can swallow  Culture MRSA by PCR negative Rapid strep screen negative  Consultants: Dr. Evelena Peat Teoh(ENT)  Procedures/Significant Events: CT soft tissue neck with contrast 01/09/2014 -large phlegmon extending from the left parapharyngeal space to the  left aryepiglottic fold and left lateral aspect of epiglottis with  left-to-right mass effect upon the hypopharynx and narrowing of the  airway at the level of the false cords.  Echocardiogram 08/01/2013 Left ventricle: The cavity size was normal. Mild concentric hypertrophy. - LVEF=  50% to 55%. Wall motion was normal;  - Aortic valve: Mild regurgitation. - Mitral valve: Mild regurgitation.  Antibiotics: Clindamycin 4/15 >>  DVT prophylaxis: Lovenox 40 mg daily  Subjective: Slowly  improving. Tolerating puree and thin. Afraid to "go home too early"  Objective: VITAL SIGNS:  Blood pressure 166/78, pulse 84, temperature 98.4 F (36.9 C), temperature source Oral, resp. rate 16, height 6' (1.829 m), weight 81.058 kg (178 lb 11.2 oz), SpO2 100.00%.  Intake/Output Summary (Last 24 hours) at 01/13/14 1202 Last data filed at 01/13/14 0700  Gross per 24 hour  Intake   2310 ml  Output    625 ml  Net   1685 ml   Exam: General: hoarse HEENT: OP unobstructed Neck supple Lungs: Clear to auscultation bilaterally without wheezes or crackles Cardiovascular: Regular rate and rhythm without murmur gallop or rub  Abdomen: Nontender, nondistended, soft, bowel sounds positive, no rebound, no ascites, no appreciable mass Extremities: No significant cyanosis, clubbing, or edema bilateral lower extremities  Data Reviewed: Basic Metabolic Panel:  Recent Labs Lab 01/09/14 1230 01/10/14 0735 01/11/14 0357 01/12/14 0555 01/13/14 0841  NA 139 141 140 143 141  K 3.8 4.1 3.4* 3.6* 3.8  CL 102 101 102 108 104  CO2 24 23 24 24 19   GLUCOSE 255* 241* 222* 225* 233*  BUN 19 22 26* 18 10  CREATININE 0.90 0.84 0.80 0.70 0.60  CALCIUM 8.8 8.6 8.5 8.5 8.5  MG  --   --  2.4 2.2  --    Liver Function Tests:  Recent Labs Lab 01/10/14 0735 01/11/14 0357  AST 15 14  ALT 18 16  ALKPHOS 67 80  BILITOT 0.7 0.6  PROT 6.5 6.5  ALBUMIN 2.9* 2.6*   CBC:  Recent Labs Lab 01/08/14 1345 01/09/14 1230 01/10/14 0735 01/11/14 0357 01/12/14 0555 01/13/14 1829  WBC 22.1* 19.4* 15.1* 11.5* 8.5 8.8  NEUTROABS 18.7* 16.1* 12.4* 9.1*  --   --   HGB 13.2 12.0* 11.4* 11.1* 10.9* 12.0*  HCT 37.0* 34.4* 32.5* 32.3* 31.9* 34.5*  MCV 89.8 90.5 88.8 89.7 89.4 89.6  PLT 207 185 183 234 245 282   CBG:  Recent Labs Lab 01/12/14 1135 01/12/14 1545 01/12/14 1705 01/12/14 2252 01/13/14 0726  GLUCAP 213* 229* 267* 187* 211*    Recent Results (from the past 240 hour(s))  RAPID STREP  SCREEN     Status: None   Collection Time    01/08/14  1:35 PM      Result Value Ref Range Status   Streptococcus, Group A Screen (Direct) NEGATIVE  NEGATIVE Final  CULTURE, GROUP A STREP     Status: None   Collection Time    01/08/14  1:35 PM      Result Value Ref Range Status   Specimen Description THROAT   Final   Special Requests NONE   Final   Culture     Final   Value: No Beta Hemolytic Streptococci Isolated     Performed at [facility]   Report Status 01/10/2014 FINAL   Final  MRSA PCR SCREENING     Status: None   Collection Time    01/09/14  4:38 PM      Result Value Ref Range Status   MRSA by PCR NEGATIVE  NEGATIVE Final   Comment:            The GeneXpert MRSA Assay (FDA     approved for NASAL specimens     only), is one component of a     comprehensive MRSA colonization     surveillance program. It is not     intended to diagnose MRSA     infection nor to guide or     monitor treatment for     MRSA infections.  CLOSTRIDIUM DIFFICILE BY PCR     Status: None   Collection Time    01/12/14  5:00 PM      Result Value Ref Range Status   C difficile by pcr NEGATIVE  NEGATIVE Final       Scheduled Meds:  Scheduled Meds: . amLODipine  5 mg Oral QHS  . clindamycin (CLEOCIN) IV  600 mg Intravenous 4 times per day  . enoxaparin (LOVENOX) injection  40 mg Subcutaneous Q24H  . insulin aspart  0-15 Units Subcutaneous TID WC  . insulin aspart  0-5 Units Subcutaneous QHS  . insulin glargine  10 Units Subcutaneous QHS  . magic mouthwash w/lidocaine  15 mL Oral Once  . metoprolol  5 mg Intravenous 4 times per day  . nitroGLYCERIN  0.5 inch Topical 4 times per day  . pantoprazole  40 mg Oral Daily    Time spent on care of this patient: 35 mins   [name] , DO  [facility] [phone]   On-Call/Text Page:      [email or web address]      password [email or web address]  If 7PM-7AM, please contact night-coverage [email or web address] Password [email or web address] 01/13/2014, 12:02 PM    LOS: 4 days

## 2014-01-14 LAB — GLUCOSE, CAPILLARY
GLUCOSE-CAPILLARY: 289 mg/dL — AB (ref 70–99)
Glucose-Capillary: 193 mg/dL — ABNORMAL HIGH (ref 70–99)
Glucose-Capillary: 250 mg/dL — ABNORMAL HIGH (ref 70–99)
Glucose-Capillary: 310 mg/dL — ABNORMAL HIGH (ref 70–99)

## 2014-01-14 MED ORDER — ASPIRIN 81 MG PO CHEW
81.0000 mg | CHEWABLE_TABLET | Freq: Every day | ORAL | Status: DC
  Administered 2014-01-14 – 2014-01-15 (×2): 81 mg via ORAL
  Filled 2014-01-14 (×2): qty 1

## 2014-01-14 MED ORDER — LISINOPRIL 40 MG PO TABS
40.0000 mg | ORAL_TABLET | Freq: Every day | ORAL | Status: DC
  Administered 2014-01-14: 40 mg via ORAL
  Filled 2014-01-14 (×3): qty 1

## 2014-01-14 MED ORDER — CARVEDILOL 12.5 MG PO TABS
12.5000 mg | ORAL_TABLET | Freq: Every day | ORAL | Status: DC
  Administered 2014-01-14 – 2014-01-15 (×2): 12.5 mg via ORAL
  Filled 2014-01-14 (×3): qty 1

## 2014-01-14 MED ORDER — FUROSEMIDE 40 MG PO TABS
40.0000 mg | ORAL_TABLET | Freq: Every day | ORAL | Status: DC
  Administered 2014-01-14 – 2014-01-15 (×2): 40 mg via ORAL
  Filled 2014-01-14 (×2): qty 1

## 2014-01-14 MED ORDER — SPIRONOLACTONE 25 MG PO TABS
25.0000 mg | ORAL_TABLET | Freq: Every day | ORAL | Status: DC
  Administered 2014-01-14 – 2014-01-15 (×2): 25 mg via ORAL
  Filled 2014-01-14 (×2): qty 1

## 2014-01-14 MED ORDER — INSULIN GLARGINE 100 UNIT/ML ~~LOC~~ SOLN
15.0000 [IU] | Freq: Every day | SUBCUTANEOUS | Status: DC
Start: 2014-01-14 — End: 2014-01-15
  Administered 2014-01-14: 15 [IU] via SUBCUTANEOUS
  Filled 2014-01-14: qty 0.15

## 2014-01-14 MED ORDER — PHENOL 1.4 % MT LIQD: 1.0000 | OROMUCOSAL | Status: DC | PRN

## 2014-01-14 NOTE — Progress Notes (Signed)
Inpatient Diabetes Program Recommendations  AACE/ADA: New Consensus Statement on Inpatient Glycemic Control (2013)  Target Ranges:  Prepandial:   less than 140 mg/dL      Peak postprandial:   less than 180 mg/dL (1-2 hours)      Critically ill patients:  140 - 180 mg/dL   Inpatient Diabetes Program Recommendations Insulin - Basal: Pt states he is not on an insulin pump at home.  Per previous note, pt was talking about using a V-GO pump which delivers only bolus insulin in addition to the lantus he takes at home.- which he states was 40 units at home  Noted lantus of 15 units ordered tonight, increase from 12 units yestereday. Glucose running almost steadily in 200 range.  Thank you, Rosita Kea, RN, CNS, Diabetes Coordinator [phone])

## 2014-01-14 NOTE — Progress Notes (Signed)
Subjective: His throat discomfort continues to improve.  Still has slight dysphagia.  No significant neck pain.  Objective: Vital signs in last 24 hours: Temp:  [98.1 F (36.7 C)-98.3 F (36.8 C)] 98.2 F (36.8 C) (04/20 0543) Pulse Rate:  [78-92] 83 (04/20 0543) Resp:  [16-18] 16 (04/20 0543) BP: (150-175)/(74-84) 169/84 mmHg (04/20 0543) SpO2:  [97 %-99 %] 99 % (04/20 0543)  Physical Exam  Constitutional: He is oriented to person, place, and time. He appears well-developed and well-nourished.  HENT:  Head: Normocephalic.  Ears: EAC and TM wnl.  Eyes: Conjunctivae are normal. Pupils are equal, round, and reactive to light.  [place]/OC: Normal mucosa. No uvula deviation. No mass or lesion is noted.  Neck: Normal range of motion. The left neck tenderness has resolved. No obvious skin changes. No stridor.  Cardiovascular: Normal rate, regular rhythm and normal heart sounds.  Pulmonary/Chest: Effort normal and breath sounds normal.  Neurological: He is alert and oriented to person, place, and time. He has normal reflexes.  Skin: Skin is warm.  Psychiatric: He has a normal mood and affect.    Recent Labs  01/12/14 0555 01/13/14 0841  WBC 8.5 8.8  HGB 10.9* 12.0*  HCT 31.9* 34.5*  PLT 245 282    Recent Labs  01/12/14 0555 01/13/14 0841  NA 143 141  K 3.6* 3.8  CL 108 104  CO2 24 19  GLUCOSE 225* 233*  BUN 18 10  CREATININE 0.70 0.60  CALCIUM 8.5 8.5    Medications:  I have reviewed the patient's current medications. Scheduled: . amLODipine  5 mg Oral QHS  . aspirin  81 mg Oral Daily  . carvedilol  12.5 mg Oral Q breakfast  . clindamycin  300 mg Oral 4 times per day  . enoxaparin (LOVENOX) injection  40 mg Subcutaneous Q24H  . furosemide  40 mg Oral Daily  . insulin aspart  0-15 Units Subcutaneous TID WC  . insulin aspart  0-5 Units Subcutaneous QHS  . insulin glargine  15 Units Subcutaneous QHS  . lisinopril  40 mg Oral QHS  . magic mouthwash w/lidocaine  15 mL  Oral Once  . nitroGLYCERIN  0.5 inch Topical 4 times per day  . spironolactone  25 mg Oral Daily   GNF:AOZHYQMVHQION (TYLENOL) oral liquid 160 mg/5 mL, hydrALAZINE, nitroGLYCERIN, ondansetron (ZOFRAN) IV, oxyCODONE  Assessment/Plan: Left parapharyngeal phlegmon. Continue to improve clinically. WBC has normalized. On dysphagia 1 diet. Switchedg to po clindamycin in anticipation of discharge. If discharge home tomorrow, pt may f/u in my office in 1 week.    LOS: 5 days   Sui W [place] Gandolfo 01/14/2014, 12:31 PM

## 2014-01-14 NOTE — Progress Notes (Signed)
Speech Language Pathology Treatment: Dysphagia  Patient Details Name: David Lane MRN: [id number] DOB: 10-Sep-1946 Today's Date: 01/14/2014 Time: 1325-1401 SLP Time Calculation (min): 36 min  Assessment / Plan / Recommendation Clinical Impression  Patient upset that he's "being pushed out" before he's ready to go home. Also feels that staff talks "over my head."   Throat is still sore and he is still unable to swallow pills.  SLP educated pt. To crushing, breaking pills in half, opening capsules only after clearing with the MD or pharmacist.  Also discussed that many pills also come in liquid form.  Reviewed appropriate diet options once d/c'd and how to prepare foods for a pureed or chopped/soft consistency.  Pt. Is difficult to educate, as he is quite verbose, and with several various complaints, rather than actually listening to potential solutions.  Pt. Does appear to be following compensatory strategies well, but c/o sore throat from the coughing after the swallow (as was instructed).  Question if Chloroseptic Spray before and after meals might ease pain.  Defer to MD.   HPI HPI: 68 year old male admitted 01/09/14 due to sore throat.  PMH significant for DM, CAD, HTN, GERD, NSTEMI.  Pt dx with 15x52mm abscess near the left aryepiglottic fold, which is adversely affecting pt swallow function and safety. CT soft tissue neck with contrast 01/09/2014: large phlegmon extending from the left parapharyngeal space to the left aryepiglottic fold and left lateral aspect of epiglottis with left-to-right mass effect upon the hypopharynx and narrowing of the airway at the level of the false cords.   Pertinent Vitals Afebrile; diminished/clear LS  SLP Plan  Continue with current plan of care    Recommendations Diet recommendations: Dysphagia 1 (puree);Thin liquid Liquids provided via: Teaspoon Medication Administration: Crushed with puree Supervision: Patient able to self feed;Full supervision/cueing  for compensatory strategies Compensations: Slow rate;Small sips/bites;Multiple dry swallows after each bite/sip;Clear throat after each swallow Postural Changes and/or Swallow Maneuvers: Seated upright 90 degrees              Oral Care Recommendations: Oral care BID;Patient independent with oral care Follow up Recommendations: Home health SLP Plan: Continue with current plan of care    GO     Joaquim Nam 01/14/2014, 2:02 PM

## 2014-01-14 NOTE — Progress Notes (Addendum)
Progress Note  David Lane MGQ:[id number] DOB: 23-Sep-1946 DOA: 01/09/2014 PCP: [name], MD  Admit HPI / Brief Narrative: 68 y.o. M w/ a PMHx of labile diabetes type 2 on insulin pump, CAD, Hypertension, HLD, CAD S/P NSTEMI with stent placement in LAD/PDA, and ischemic cardiomyopathy who presented with worsening sore throat and throat swelling that started a couples of days prior to admission. He was seen in the ED and was prescribed antibiotics but he was not able to swallow the pills and vomited.   Assessment/Plan:  Left parapharyngeal phlegmon. Pt very upset and worried about taking pills and living at home alone.  Resume home meds and monitor. Crush pills as able. Speech to follow. Discussed with Dr. Benjamine Mola, clear for discharge from his perspective. May take a week or so to resolve.  Long discussion with patient and sister, trying to manage expectations.  Will arrange home RN, ST to monitor.   Diabetes type 2 Increase lantus further  Chronic Systolic and diastolic CHF Compensated. Resume meds  HTN -BP poorly controlled. Resume home meds  Hypokalemia  HLD -Continue Zocor 40 mg daily when safe for oral intake   Pancreatic insufficiency -Continue Pancrease QAC when oral intake at baseline  Anxiety Contributing to clinical picture/readiness for discharge  Code Status: FULL Family Communication: sister at bedside Disposition Plan: home tomorrow if stable  Culture MRSA by PCR negative Rapid strep screen negative  Consultants: Dr. Evelena Peat Teoh(ENT)  Procedures/Significant Events: CT soft tissue neck with contrast 01/09/2014 -large phlegmon extending from the left parapharyngeal space to the  left aryepiglottic fold and left lateral aspect of epiglottis with  left-to-right mass effect upon the hypopharynx and narrowing of the  airway at the level of the false cords.  Echocardiogram 08/01/2013 Left ventricle: The cavity size was normal. Mild concentric  hypertrophy. - LVEF=  50% to 55%. Wall motion was normal;  - Aortic valve: Mild regurgitation. - Mitral valve: Mild regurgitation.  Antibiotics: Clindamycin 4/15 >>  DVT prophylaxis: Lovenox 40 mg daily  Subjective: Slowly improving. Tolerating puree and thin. Afraid to "go home too early". Having difficulty taking pills whole. Talking about being sent home too early last year.    Objective: VITAL SIGNS:  Blood pressure 169/84, pulse 83, temperature 98.2 F (36.8 C), temperature source Oral, resp. rate 16, height 6' (1.829 m), weight 81.058 kg (178 lb 11.2 oz), SpO2 99.00%.  Intake/Output Summary (Last 24 hours) at 01/14/14 1156 Last data filed at 01/13/14 1400  Gross per 24 hour  Intake    240 ml  Output    400 ml  Net   -160 ml   Exam: General: yelling, upset. Voice less hoarse  HEENT: OP unobstructed Neck supple Lungs: Clear to auscultation bilaterally without wheezes or crackles Cardiovascular: Regular rate and rhythm without murmur gallop or rub  Abdomen: Nontender, nondistended, soft, bowel sounds positive, no rebound, no ascites, no appreciable mass Extremities: No significant cyanosis, clubbing, or edema bilateral lower extremities  Data Reviewed: Basic Metabolic Panel:  Recent Labs Lab 01/09/14 1230 01/10/14 0735 01/11/14 0357 01/12/14 0555 01/13/14 0841  NA 139 141 140 143 141  K 3.8 4.1 3.4* 3.6* 3.8  CL 102 101 102 108 104  CO2 24 23 24 24 19   GLUCOSE 255* 241* 222* 225* 233*  BUN 19 22 26* 18 10  CREATININE 0.90 0.84 0.80 0.70 0.60  CALCIUM 8.8 8.6 8.5 8.5 8.5  MG  --   --  2.4 2.2  --  Liver Function Tests:  Recent Labs Lab 01/10/14 0735 01/11/14 0357  AST 15 14  ALT 18 16  ALKPHOS 67 80  BILITOT 0.7 0.6  PROT 6.5 6.5  ALBUMIN 2.9* 2.6*   CBC:  Recent Labs Lab 01/08/14 1345 01/09/14 1230 01/10/14 0735 01/11/14 0357 01/12/14 0555 01/13/14 0841  WBC 22.1* 19.4* 15.1* 11.5* 8.5 8.8  NEUTROABS 18.7* 16.1* 12.4* 9.1*  --   --     HGB 13.2 12.0* 11.4* 11.1* 10.9* 12.0*  HCT 37.0* 34.4* 32.5* 32.3* 31.9* 34.5*  MCV 89.8 90.5 88.8 89.7 89.4 89.6  PLT 207 185 183 234 245 282   CBG:  Recent Labs Lab 01/13/14 1203 01/13/14 1720 01/13/14 2226 01/14/14 0720 01/14/14 1142  GLUCAP 246* 299* 216* 193* 250*    Recent Results (from the past 240 hour(s))  RAPID STREP SCREEN     Status: None   Collection Time    01/08/14  1:35 PM      Result Value Ref Range Status   Streptococcus, Group A Screen (Direct) NEGATIVE  NEGATIVE Final  CULTURE, GROUP A STREP     Status: None   Collection Time    01/08/14  1:35 PM      Result Value Ref Range Status   Specimen Description THROAT   Final   Special Requests NONE   Final   Culture     Final   Value: No Beta Hemolytic Streptococci Isolated     Performed at [facility]   Report Status 01/10/2014 FINAL   Final  MRSA PCR SCREENING     Status: None   Collection Time    01/09/14  4:38 PM      Result Value Ref Range Status   MRSA by PCR NEGATIVE  NEGATIVE Final   Comment:            The GeneXpert MRSA Assay (FDA     approved for NASAL specimens     only), is one component of a     comprehensive MRSA colonization     surveillance program. It is not     intended to diagnose MRSA     infection nor to guide or     monitor treatment for     MRSA infections.  CLOSTRIDIUM DIFFICILE BY PCR     Status: None   Collection Time    01/12/14  5:00 PM      Result Value Ref Range Status   C difficile by pcr NEGATIVE  NEGATIVE Final       Scheduled Meds:  Scheduled Meds: . amLODipine  5 mg Oral QHS  . aspirin  81 mg Oral Daily  . carvedilol  12.5 mg Oral Q breakfast  . clindamycin  300 mg Oral 4 times per day  . enoxaparin (LOVENOX) injection  40 mg Subcutaneous Q24H  . furosemide  40 mg Oral Daily  . insulin aspart  0-15 Units Subcutaneous TID WC  . insulin aspart  0-5 Units Subcutaneous QHS  . insulin glargine  15 Units Subcutaneous QHS  . lisinopril  40 mg  Oral QHS  . magic mouthwash w/lidocaine  15 mL Oral Once  . nitroGLYCERIN  0.5 inch Topical 4 times per day  . spironolactone  25 mg Oral Daily    Time spent on care of this patient: 35 mins   David Lane , MD [facility] [phone]   On-Call/Text Page:      [email or web address]      password [facility]  If 7PM-7AM, please contact night-coverage [email or web address] Password [email or web address] 01/14/2014, 11:56 AM   LOS: 5 days

## 2014-01-14 NOTE — Progress Notes (Signed)
Patient has been very anxious tonight;very worried about going home tomorrow.  He says he does not feel ready;states he cannot swallow pills or eat very much.  He has talked about this repeatedly.  I assured him that I would let the oncoming nurse know his concerns and encouraged him to express his concerns to his doctor.

## 2014-01-14 NOTE — Care Management Note (Signed)
  Page 1 of 1   01/14/2014     11:01:47 AM CARE MANAGEMENT NOTE 01/14/2014  Patient:  TABER, SWEETSER   Account Number:  [id number]  Date Initiated:  01/14/2014  Documentation initiated by:  Magdalen Spatz  Subjective/Objective Assessment:     Action/Plan:   Anticipated DC Date:  01/14/2014   Anticipated DC Plan:  [place]         Choice offered to / List presented to:  C-1 Patient        [place] arranged  HH-1 RN  [place].   Status of service:  Completed, signed off Medicare Important Message given?   (If response is "NO", the following Medicare IM given date fields will be blank) Date Medicare IM given:   Date Additional Medicare IM given:    Discharge Disposition:  [place]  Per UR Regulation:    If discussed at Long Length of Stay Meetings, dates discussed:    Comments:  01-14-14 Patient has his medicare card with him .  Not listed in [facility] , spoke with Enid Derry in admitting , faxed copy to her at [phone]. Magdalen Spatz RN BSN     01-14-14 Confirmed face sheet information with patient . Magdalen Spatz RN BSN [phone]

## 2014-01-15 LAB — GLUCOSE, CAPILLARY
GLUCOSE-CAPILLARY: 239 mg/dL — AB (ref 70–99)
Glucose-Capillary: 236 mg/dL — ABNORMAL HIGH (ref 70–99)

## 2014-01-15 MED ORDER — CLINDAMYCIN PALMITATE HCL 75 MG/5ML PO SOLR: 300.0000 mg | Freq: Four times a day (QID) | ORAL | Status: DC

## 2014-01-15 MED ORDER — INSULIN ASPART 100 UNIT/ML ~~LOC~~ SOLN
4.0000 [IU] | Freq: Three times a day (TID) | SUBCUTANEOUS | Status: DC
  Administered 2014-01-15: 4 [IU] via SUBCUTANEOUS

## 2014-01-15 MED ORDER — ISOSORBIDE MONONITRATE ER 30 MG PO TB24: 30.0000 mg | ORAL_TABLET | Freq: Every day | ORAL | Status: DC

## 2014-01-15 MED ORDER — ACETAMINOPHEN 160 MG/5ML PO SOLN: 650.0000 mg | Freq: Four times a day (QID) | ORAL | Status: DC | PRN

## 2014-01-15 MED ORDER — OXYCODONE HCL 5 MG/5ML PO SOLN: 5.0000 mg | ORAL | Status: DC | PRN

## 2014-01-15 MED ORDER — SIMVASTATIN 40 MG PO TABS
40.0000 mg | ORAL_TABLET | Freq: Every day | ORAL | Status: DC
Start: 2014-01-15 — End: 2014-12-05

## 2014-01-15 MED ORDER — PANCRELIPASE (LIP-PROT-AMYL) 36000-114000 UNITS PO CPEP: 2.0000 | ORAL_CAPSULE | Freq: Three times a day (TID) | ORAL | Status: DC

## 2014-01-15 MED ORDER — MULTIVITAMINS PO CAPS: 1.0000 | ORAL_CAPSULE | Freq: Every day | ORAL | Status: DC

## 2014-01-15 MED ORDER — INSULIN GLARGINE 100 UNIT/ML ~~LOC~~ SOLN
20.0000 [IU] | Freq: Every day | SUBCUTANEOUS | Status: DC
  Filled 2014-01-15: qty 0.2

## 2014-01-15 MED ORDER — INSULIN ASPART 100 UNIT/ML ~~LOC~~ SOLN: 0.0000 [IU] | Freq: Every day | SUBCUTANEOUS | Status: DC

## 2014-01-15 MED ORDER — OMEPRAZOLE 20 MG PO CPDR: 20.0000 mg | DELAYED_RELEASE_CAPSULE | Freq: Two times a day (BID) | ORAL | Status: DC

## 2014-01-15 MED ORDER — INSULIN ASPART 100 UNIT/ML ~~LOC~~ SOLN
0.0000 [IU] | Freq: Three times a day (TID) | SUBCUTANEOUS | Status: DC
  Administered 2014-01-15: 7 [IU] via SUBCUTANEOUS

## 2014-01-15 NOTE — Discharge Summary (Signed)
Physician Discharge Summary  David Lane BRA:[id number] DOB: 1946/07/11 DOA: 01/09/2014  PCP: [name], MD  Admit date: 01/09/2014 Discharge date: 01/15/2014  Time spent: greater than 30 minutes  Recommendations for Outpatient Follow-up:  1. Home speech therapy and RN arranged. 2. May crush meds as able 3. Advance to solid diet as tolerated  Discharge Diagnoses:  Principle problem   Parapharyngeal phlegmon Active Problems:   DIABETES MELLITUS, TYPE II, labile   HYPERLIPIDEMIA   ANXIETY   HYPERTENSION   Chronic combined systolic and diastolic CHF (congestive heart failure), chronic   CAD, 07/14/12- LAD/PDA DES   Ischemic cardiomyopathy, EF 35-40% by echo 07/14/12   Pancreatic insufficiency   Discharge Condition: stable  Filed Weights   01/09/14 1636 01/11/14 0415  Weight: 81.6 kg (179 lb 14.3 oz) 81.058 kg (178 lb 11.2 oz)    History of present illness: 68 y.o. male with PMH of diabetes, CAD, Hypertension who presents with worsening sore throat, throat swelling, that started couples of days ago. He was seen in the ED and was prescribe antibiotics but he was not able to swallow pills and vomited. He denies dyspnea.  Hospital Course:  Admitted to stepdown unit, [facility]. ENT consulted. Started on IV antibiotics and steroids. Swallowing, phonation, pain and swelling slowly, but steadily improved.  By discharge, patient is tolerating a pureed diet and crushed meds. Has been on oral antibiotics for several days. Other medical problems have been stable. Cleared by ENT for discharge for several days, now.  Has worked with speech therapy extensively.  counseled extensively by Dr. Benjamine Mola and myself.  Anxiety appears to be a big component of his symptoms.  Has received maximal hospital benefit and no longer meets inpatient criteria.  Will continue to improve over the next week or so.  Procedures:  none  Consultations:  ENT, [facility]  Discharge Exam: Filed  Vitals:   01/15/14 0926  BP: 154/74  Pulse: 88  Temp:   Resp:     General: calmer, more cooperative HEENT: OP unobstructed. No thrush. MMM Neck: soft, supple Cardiovascular: RRR Respiratory: CTA  Discharge Orders   Future Appointments Provider Department Dept Phone   01/22/2014 9:45 AM [name], MD [facility] [phone]   Future Orders Complete By Expires   Activity as tolerated - No restrictions  As directed    Discharge instructions  As directed        Medication List    STOP taking these medications       penicillin v potassium 500 MG tablet  Commonly known as:  VEETID      TAKE these medications       ACCU-CHEK SOFTCLIX LANCETS lancets  Use as instructed     acetaminophen 160 MG/5ML solution  Commonly known as:  TYLENOL  Take 20.3 mLs (650 mg total) by mouth every 6 (six) hours as needed for mild pain.     amLODipine 5 MG tablet  Commonly known as:  NORVASC  Take 5 mg by mouth at bedtime.     aspirin 81 MG tablet  Take 81 mg by mouth daily.     carvedilol 12.5 MG tablet  Commonly known as:  COREG  Take 12.5 mg by mouth daily.     clindamycin 75 MG/5ML solution  Commonly known as:  CLEOCIN  Take 20 mLs (300 mg total) by mouth every 6 (six) hours. For 7 days     furosemide 40 MG tablet  Commonly known as:  LASIX  Take  40 mg by mouth daily.     glucose blood test strip  Use as instructed     glucose blood test strip  Commonly known as:  ONE TOUCH ULTRA TEST  1 each by Other route 2 (two) times daily. And lancets 2/day 250.01     HYDROcodone-homatropine 5-1.5 MG/5ML syrup  Commonly known as:  HYDROMET  Take 5 mLs by mouth every 6 (six) hours as needed for cough.     insulin aspart 100 UNIT/ML injection  Commonly known as:  novoLOG  Inject 30-40 Units into the skin daily. Per sliding scale     isosorbide mononitrate 30 MG 24 hr tablet  Commonly known as:  IMDUR  Take 1 tablet (30 mg total) by mouth daily. HOLD UNTIL ABLE  TO SWALLOW PILLS WHOLE     lisinopril 40 MG tablet  Commonly known as:  PRINIVIL,ZESTRIL  Take 40 mg by mouth at bedtime.     loratadine 10 MG dissolvable tablet  Commonly known as:  CLARITIN REDITABS  Take 10 mg by mouth daily as needed for allergies.     multivitamin capsule  Take 1 capsule by mouth daily. HOLD UNTIL ABLE TO SWALLOW PILLS WHOLE     nitroGLYCERIN 0.4 MG SL tablet  Commonly known as:  NITROSTAT  Place 1 tablet (0.4 mg total) under the tongue every 5 (five) minutes as needed for chest pain.     omeprazole 20 MG capsule  Commonly known as:  PRILOSEC  Take 1 capsule (20 mg total) by mouth 2 (two) times daily before a meal. HOLD FOR 2 WEEKS     oxyCODONE 5 MG/5ML solution  Commonly known as:  ROXICODONE  Take 5 mLs (5 mg total) by mouth every 4 (four) hours as needed for severe pain.     Pancrelipase (Lip-Prot-Amyl) 36000 UNITS Cpep  Take 2 capsules by mouth 3 (three) times daily with meals. HOLD UNTIL ABLE TO SWALLOW PILLS WHOLE     Pen Needles 31G X 6 MM Misc  1 Device by Does not apply route 2 (two) times daily.     simvastatin 40 MG tablet  Commonly known as:  ZOCOR  Take 1 tablet (40 mg total) by mouth at bedtime. HOLD UNTIL ABLE TO SWALLOW PILLS WHOLE     spironolactone 25 MG tablet  Commonly known as:  ALDACTONE  Take 25 mg by mouth daily.     V-GO 20 Kit  1 Device by Does not apply route daily.       Allergies  Allergen Reactions  . Lipitor [Atorvastatin] Other (See Comments)    Muscle weakness       Follow-up Information   Follow up with [facility] In 1 week.       The results of significant diagnostics from this hospitalization (including imaging, microbiology, ancillary and laboratory) are listed below for reference.    Significant Diagnostic Studies: Ct Soft Tissue Neck W Contrast  01/09/2014   CLINICAL DATA:  Odynophagia, left neck pain and swelling, feels like throat is closing, history diabetes, hypertension, coronary artery disease,  CHF, ischemic cardiomyopathy, GERD  EXAM: CT NECK WITH CONTRAST  TECHNIQUE: Multidetector CT imaging of the neck was performed using the standard protocol following the bolus administration of intravenous contrast. Sagittal and coronal MPR images reconstructed from axial data set.  CONTRAST:  76m OMNIPAQUE IOHEXOL 300 MG/ML  SOLN IV  COMPARISON:  None  FINDINGS: Visualized intracranial structures unremarkable.  Visualized paranasal sinuses, mastoid air cells, and middle ear cavities  clear bilaterally.  Vascular structures grossly patent on nondedicated exam.  Scattered reactive lymph nodes in the cervical region bilaterally a few on left minimally enlarged, including a 9 mm short axis level IIA node image 47.  Diffuse soft tissue infiltration involving the left parapharyngeal space extending from C2 to the tip of the left piriform sinus.  Displacement of the oropharynx and hypopharynx left to right.  Thickening of left lateral epiglottis and left aryepiglottic fold.  Transverse narrowing of the airway at the level of the false cords.  Deep soft tissue edema extends medial and lateral to the left hyoid arch with infiltration extending to the left submandibular gland, which appears asymmetrically enlarged versus right.  Inferior portions of this collection are low-attenuation with a focal area of lower attenuation inferiorly image 63, 15 x 12 mm with an minimally enhancing rim question developing abscess.  Skin thickening and subcutaneous edema left submandibular and upper left cervical regions.  Prevertebral soft tissues normal thickness.  Symmetric thyroid and parotid glands.  No discrete soft tissue mass or significantly enlarged cervical lymph nodes to suggest tumor.  Lung apices clear.  No acute osseous findings.  IMPRESSION: Trans spatial inflammatory process in the left cervical region with a large phlegmon extending from the left parapharyngeal space to the left aryepiglottic fold and left lateral aspect of  epiglottis with left-to-right mass effect upon the hypopharynx and narrowing of the airway at the level of the false cords.  Findings are most likely inflammatory rather than neoplastic, without discrete mass or significant enlarged lymph nodes identified.  Question developing abscess collection inferiorly near left aryepiglottic fold 15 x 12 mm image 63.  Recommend ENT follow up.  Findings called to Dr. Christy Gentles On 01/09/2014 at 1343 hr.   Electronically Signed   By: Lavonia Dana M.D.   On: 01/09/2014 13:45   Dg Swallowing Func-speech Pathology  01/12/2014   Leah Meryl McCoy, CCC-SLP     01/12/2014  1:41 PM Objective Swallowing Evaluation: Modified Barium Swallowing Study   Patient Details  Name: PAYTON PRINSEN MRN: [id number] Date of Birth: 02-Jan-1946  Today's Date: 01/12/2014 Time: 1240-1305 SLP Time Calculation (min): 25 min  Past Medical History:  Past Medical History  Diagnosis Date  . Hyperlipemia   . Hypertension   . Pancreatitis 2000's  . Hepatic lesion 02/04/11  . GERD (gastroesophageal reflux disease)   . CAD (coronary artery disease)     last cath [facility] DR.  Mihai Croitoru showing  some  disease  involving LCX and small size of Diag   . NSTEMI (non-ST elevated myocardial infarction) 11/21/2009  . Shortness of breath   . CHF exacerbation, due to diastolic dysfunction 19/37/9024  . Ischemic cardiomyopathy     EF 35-40%  . Liver hemangioma   . Type II diabetes mellitus    Past Surgical History:  Past Surgical History  Procedure Laterality Date  . Incision and drainage abscess anal  1970's  . Cardiac catheterization  2011    minimal disease, medical management  . Coronary angioplasty with stent placement  07/14/2012    successful PCI & stenting of mid LAD & PDA off the dominant CX   HPI:  68 year old male admitted 01/09/14 due to sore throat.  PMH  significant for DM, CAD, HTN, GERD, NSTEMI.  Pt dx with 15x67m  abscess near the left aryepiglottic fold, which is adversely  affecting pt swallow function and  safety. CT soft tissue neck  with contrast 01/09/2014: large  phlegmon extending from the left  parapharyngeal space to the left aryepiglottic fold and left  lateral aspect of epiglottis with left-to-right mass effect upon  the hypopharynx and narrowing of the airway at the level of the  false cords.     Assessment / Plan / Recommendation Clinical Impression  Dysphagia Diagnosis: Moderate pharyngeal phase dysphagia;Severe  pharyngeal phase dysphagia Clinical impression: Patient presents with a moderate-severe  structural based pharyngeal dysphagia. Location of phlegmon  negatively impacting deflection of epiglottis, laryngeal closure,  and UES relaxation, resulting in penetration and/or aspiration of  all tested consistencies and pharyngeal residuals. Attempt at  various head postures unsuccessful at facilitating better airway  protection or pharyngeal clearance. Patient independently  compensating by completing multiple spontaneous dry swallows,  which in addition to some spontaneous and some cued throat  clearing,  is successful to reduce pharyngeal residuals and clear  penetrates/aspirates. Suspect some degree of aspiration of  secretions noted as well given wet vocal quality at baseline  which patient also appears to be managing with spontaneous throat  clearing. Education complete with patient regarding results.  Although patient hesitant to initiate a diet, recommend dysphagia  1 (puree) with thin liquids with aspiration precautions. Hopeful  to see continued clinical improvements at bedside with ability to  further advance diet without repeat objective evaluation as  treatment course continues. Will f/u.     Treatment Recommendation  Therapy as outlined in treatment plan below    Diet Recommendation Dysphagia 1 (Puree);Thin liquid   Liquid Administration via: Cup;No straw Medication Administration: Crushed with puree Supervision: Patient able to self feed;Full supervision/cueing  for compensatory strategies  Compensations: Slow rate;Small sips/bites;Multiple dry swallows  after each bite/sip;Clear throat after each swallow Postural Changes and/or Swallow Maneuvers: Seated upright 90  degrees    Other  Recommendations Oral Care Recommendations: Oral care BID Other Recommendations: Have oral suction available   Follow Up Recommendations   (TBD)    Frequency and Duration min 2x/week  1 week           General Date of Onset: 01/09/14 HPI: 68 year old male admitted 01/09/14 due to sore throat.  PMH  significant for DM, CAD, HTN, GERD, NSTEMI.  Pt dx with 15x46m  abscess near the left aryepiglottic fold, which is adversely  affecting pt swallow function and safety. CT soft tissue neck  with contrast 01/09/2014: large phlegmon extending from the left  parapharyngeal space to the left aryepiglottic fold and left  lateral aspect of epiglottis with left-to-right mass effect upon  the hypopharynx and narrowing of the airway at the level of the  false cords. Type of Study: Modified Barium Swallowing Study Reason for Referral: Objectively evaluate swallowing function Previous Swallow Assessment: bedside swallow eval complete this  admission 4/17 indicated s/s of decreased airway protection  across consistencies Diet Prior to this Study: NPO Temperature Spikes Noted: No Respiratory Status: Room air History of Recent Intubation: No Behavior/Cognition: Alert;Cooperative;Pleasant mood Oral Cavity - Dentition: Dentures, top;Dentures, bottom Oral Motor / Sensory Function: Within functional limits Self-Feeding Abilities: Able to feed self Patient Positioning: Upright in chair Baseline Vocal Quality: Wet (clears with independent throat clear  and dry swallow) Volitional Cough: Strong Volitional Swallow: Able to elicit Anatomy: Within functional limits (at baseline ) Pharyngeal Secretions: Not observed secondary MBS (although  suspect due to wet vocal quality)    Reason for Referral Objectively evaluate swallowing function   Oral Phase Oral  Preparation/Oral Phase Oral Phase: WFL   Pharyngeal Phase  Pharyngeal Phase Pharyngeal Phase: Impaired Pharyngeal - Nectar Pharyngeal - Nectar Cup: Reduced anterior laryngeal  mobility;Reduced airway/laryngeal closure;Reduced epiglottic  inversion;Penetration/Aspiration during  swallow;Penetration/Aspiration after swallow;Pharyngeal residue -  valleculae;Pharyngeal residue - pyriform sinuses;Pharyngeal  residue - posterior pharnyx Penetration/Aspiration details (nectar cup): Material enters  airway, CONTACTS cords then ejected out Pharyngeal - Thin Pharyngeal - Thin Cup: Reduced anterior laryngeal  mobility;Reduced airway/laryngeal closure;Reduced epiglottic  inversion;Penetration/Aspiration during  swallow;Penetration/Aspiration after swallow;Pharyngeal residue -  valleculae;Pharyngeal residue - pyriform sinuses;Pharyngeal  residue - posterior pharnyx Penetration/Aspiration details (thin cup): Material enters  airway, passes BELOW cords then ejected out;Material enters  airway, CONTACTS cords then ejected out Pharyngeal - Solids Pharyngeal - Puree: Reduced anterior laryngeal mobility;Reduced  airway/laryngeal closure;Reduced epiglottic  inversion;Penetration/Aspiration during  swallow;Penetration/Aspiration after swallow;Pharyngeal residue -  valleculae;Pharyngeal residue - pyriform sinuses;Pharyngeal  residue - posterior pharnyx Penetration/Aspiration details (puree): Material enters airway,  CONTACTS cords then ejected out  Cervical Esophageal Phase    GO    Cervical Esophageal Phase Cervical Esophageal Phase: Impaired Cervical Esophageal Phase - Nectar Nectar Cup: Reduced cricopharyngeal relaxation Cervical Esophageal Phase - Thin Thin Cup: Reduced cricopharyngeal relaxation Cervical Esophageal Phase - Solids Puree: Reduced cricopharyngeal relaxation        Gabriel Rainwater MA, CCC-SLP [phone]  Leah Meryl McCoy 01/12/2014, 1:41 PM     Microbiology: Recent Results (from the past 240 hour(s))  RAPID STREP  SCREEN     Status: None   Collection Time    01/08/14  1:35 PM      Result Value Ref Range Status   Streptococcus, Group A Screen (Direct) NEGATIVE  NEGATIVE Final  CULTURE, GROUP A STREP     Status: None   Collection Time    01/08/14  1:35 PM      Result Value Ref Range Status   Specimen Description THROAT   Final   Special Requests NONE   Final   Culture     Final   Value: No Beta Hemolytic Streptococci Isolated     Performed at [facility]   Report Status 01/10/2014 FINAL   Final  MRSA PCR SCREENING     Status: None   Collection Time    01/09/14  4:38 PM      Result Value Ref Range Status   MRSA by PCR NEGATIVE  NEGATIVE Final   Comment:            The GeneXpert MRSA Assay (FDA     approved for NASAL specimens     only), is one component of a     comprehensive MRSA colonization     surveillance program. It is not     intended to diagnose MRSA     infection nor to guide or     monitor treatment for     MRSA infections.  CLOSTRIDIUM DIFFICILE BY PCR     Status: None   Collection Time    01/12/14  5:00 PM      Result Value Ref Range Status   C difficile by pcr NEGATIVE  NEGATIVE Final     Labs: Basic Metabolic Panel:  Recent Labs Lab 01/09/14 1230 01/10/14 0735 01/11/14 0357 01/12/14 0555 01/13/14 0841  NA 139 141 140 143 141  K 3.8 4.1 3.4* 3.6* 3.8  CL 102 101 102 108 104  CO2 24 23 24 24 19   GLUCOSE 255* 241* 222* 225* 233*  BUN 19 22 26* 18 10  CREATININE 0.90 0.84 0.80 0.70 0.60  CALCIUM 8.8 8.6 8.5  8.5 8.5  MG  --   --  2.4 2.2  --    Liver Function Tests:  Recent Labs Lab 01/10/14 0735 01/11/14 0357  AST 15 14  ALT 18 16  ALKPHOS 67 80  BILITOT 0.7 0.6  PROT 6.5 6.5  ALBUMIN 2.9* 2.6*   No results found for this basename: LIPASE, AMYLASE,  in the last 168 hours No results found for this basename: AMMONIA,  in the last 168 hours CBC:  Recent Labs Lab 01/08/14 1345 01/09/14 1230 01/10/14 0735 01/11/14 0357 01/12/14 0555  01/13/14 0841  WBC 22.1* 19.4* 15.1* 11.5* 8.5 8.8  NEUTROABS 18.7* 16.1* 12.4* 9.1*  --   --   HGB 13.2 12.0* 11.4* 11.1* 10.9* 12.0*  HCT 37.0* 34.4* 32.5* 32.3* 31.9* 34.5*  MCV 89.8 90.5 88.8 89.7 89.4 89.6  PLT 207 185 183 234 245 282   Cardiac Enzymes: No results found for this basename: CKTOTAL, CKMB, CKMBINDEX, TROPONINI,  in the last 168 hours BNP: BNP (last 3 results) No results found for this basename: PROBNP,  in the last 8760 hours CBG:  Recent Labs Lab 01/14/14 0720 01/14/14 1142 01/14/14 1710 01/14/14 2148 01/15/14 0717  GLUCAP 193* 250* 289* 310* 239*       Signed:  [name]  [facility] 01/15/2014, 10:09 AM

## 2014-01-15 NOTE — Progress Notes (Signed)
Subjective: Pt reports feeling much better.  Tolerating po well.  Objective: Vital signs in last 24 hours: Temp:  [97.7 F (36.5 C)-98.4 F (36.9 C)] 98.1 F (36.7 C) (04/21 0644) Pulse Rate:  [74-88] 88 (04/21 0926) Resp:  [14-18] 18 (04/21 0644) BP: (141-171)/(64-78) 154/74 mmHg (04/21 0926) SpO2:  [99 %] 99 % (04/21 0522)  Physical Exam  Constitutional: He is oriented to person, place, and time. He appears well-developed and well-nourished.  HENT:  Head: Normocephalic.  Ears: EAC and TM wnl.  Eyes: Conjunctivae are normal. Pupils are equal, round, and reactive to light.  David Lane/OC: Normal mucosa. No uvula deviation. No mass or lesion is noted.  Neck: Normal range of motion. The left neck tenderness has resolved. No obvious skin changes. No stridor.  Cardiovascular: Normal rate, regular rhythm and normal heart sounds.  Pulmonary/Chest: Effort normal and breath sounds normal.  Neurological: He is alert and oriented to person, place, and time. He has normal reflexes.  Skin: Skin is warm.  Psychiatric: He has a normal mood and affect.    Recent Labs  01/13/14 0841  WBC 8.8  HGB 12.0*  HCT 34.5*  PLT 282    Recent Labs  01/13/14 0841  NA 141  K 3.8  CL 104  CO2 19  GLUCOSE 233*  BUN 10  CREATININE 0.60  CALCIUM 8.5    Medications:  I have reviewed the patient's current medications. Scheduled: . amLODipine  5 mg Oral QHS  . aspirin  81 mg Oral Daily  . carvedilol  12.5 mg Oral Q breakfast  . clindamycin  300 mg Oral 4 times per day  . enoxaparin (LOVENOX) injection  40 mg Subcutaneous Q24H  . furosemide  40 mg Oral Daily  . insulin aspart  0-20 Units Subcutaneous TID WC  . insulin aspart  0-5 Units Subcutaneous QHS  . insulin aspart  4 Units Subcutaneous TID WC  . insulin glargine  20 Units Subcutaneous QHS  . lisinopril  40 mg Oral QHS  . magic mouthwash w/lidocaine  15 mL Oral Once  . nitroGLYCERIN  0.5 inch Topical 4 times per day  . spironolactone  25  mg Oral Daily   KAJ:GOTLXBWIOMBTD (TYLENOL) oral liquid 160 mg/5 mL, hydrALAZINE, nitroGLYCERIN, ondansetron (ZOFRAN) IV, oxyCODONE, phenol  Assessment/Plan: Left parapharyngeal phlegmon. Continue to improve clinically. WBC has normalized. On dysphagia 1 diet. On po clindamycin.  Pt to f/u at my office in 1 week.   LOS: 6 days   David Lane 01/15/2014, 12:05 PM

## 2014-01-15 NOTE — Progress Notes (Signed)
[facility]  SURGICAL [address] [id number] [place] [address] Phone: [phone]  January 15, 2014  Patient: David Lane  Date of Birth: 1946/07/22  Date of Visit: 01/09/2014    To Whom It May Concern:  [name] was seen and treated in our emergency department on 01/09/2014. FOUNTAIN DERUSHA  may return to work on 01/25/14.  Sincerely,      Doree Barthel, M.D.

## 2014-01-22 ENCOUNTER — Ambulatory Visit (INDEPENDENT_AMBULATORY_CARE_PROVIDER_SITE_OTHER): Payer: 59 | Admitting: Cardiovascular Disease

## 2014-01-22 ENCOUNTER — Encounter: Payer: Self-pay | Admitting: Cardiovascular Disease

## 2014-01-22 VITALS — BP 132/78 | HR 94 | Resp 16 | Ht 72.0 in | Wt 182.3 lb

## 2014-01-22 DIAGNOSIS — I2510 Atherosclerotic heart disease of native coronary artery without angina pectoris: Secondary | ICD-10-CM

## 2014-01-22 DIAGNOSIS — I509 Heart failure, unspecified: Secondary | ICD-10-CM

## 2014-01-22 DIAGNOSIS — I5042 Chronic combined systolic (congestive) and diastolic (congestive) heart failure: Secondary | ICD-10-CM

## 2014-01-22 DIAGNOSIS — I10 Essential (primary) hypertension: Secondary | ICD-10-CM

## 2014-01-22 NOTE — Patient Instructions (Signed)
Your physician recommends that you schedule a follow-up appointment in: 1 Month  

## 2014-01-23 NOTE — Progress Notes (Signed)
Patient ID: David Lane, male   DOB: 1946/08/20, 68 y.o.   MRN: [id number]      Reason for office visit Severe hypertension, CAD, ischemic cardiomyopathy  Since his last appointment, [name] had an episode of severe pharyngitis with a parapharyngeal abscess and was actually completely unable to swallow for while. He lost almost 18 pounds of weight, but he's started to gain a few of those pounds back. He now weighs about 14 pounds less than at his last appointment only one month ago. Swallowing is no longer a problem.  He has not had issues with chest pain or shortness of breath but continues to have issues with a very volatile blood pressure readings. He consistently takes the prescribed doses of amlodipine, furosemide, isosorbide mononitrate and lisinopril and spinal lactone. He also takes his evening dose of carvedilol 12.5 mg every day, but only takes the morning dose of carvedilol if his blood pressure is high. Otherwise, by mid morning he has diastolic blood pressures in the 50s which make him feel "funny headed".  He has a history of coronary disease and received a stent to the LAD artery and a stent to the posterior descending artery in 2013. He has moderately depressed left ventricular systolic function with an ejection fraction of 35-40% and has previously presented with pulmonary edema secondary to heart failure. A repeat echocardiogram on 08/01/13 showed an improved ejection fraction of 50-55% with mild concentric hypertrophy. He has very brittle diabetes mellitus related to pancreatic insufficiency following acute pancreatitis.  Allergies  Allergen Reactions  . Lipitor [Atorvastatin] Other (See Comments)    Muscle weakness    Current Outpatient Prescriptions  Medication Sig Dispense Refill  . ACCU-CHEK SOFTCLIX LANCETS lancets Use as instructed  100 each  12  . acetaminophen (TYLENOL) 160 MG/5ML solution Take 20.3 mLs (650 mg total) by mouth every 6 (six) hours as needed for  mild pain.  120 mL  0  . amLODipine (NORVASC) 5 MG tablet Take 5 mg by mouth at bedtime.       Marland Kitchen aspirin 81 MG tablet Take 81 mg by mouth daily.        . carvedilol (COREG) 12.5 MG tablet Take 12.5 mg by mouth daily.      . clindamycin (CLEOCIN) 75 MG/5ML solution Take 20 mLs (300 mg total) by mouth every 6 (six) hours. For 7 days  600 mL  0  . furosemide (LASIX) 40 MG tablet Take 40 mg by mouth daily.      Marland Kitchen glucose blood (ONE TOUCH ULTRA TEST) test strip 1 each by Other route 2 (two) times daily. And lancets 2/day 250.01  100 each  12  . glucose blood test strip Use as instructed  100 each  12  . insulin aspart (NOVOLOG) 100 UNIT/ML injection Inject 30-40 Units into the skin daily. Per sliding scale      . Insulin Disposable Pump (V-GO 20) KIT 1 Device by Does not apply route daily.  30 kit  11  . Insulin Pen Needle (PEN NEEDLES) 31G X 6 MM MISC 1 Device by Does not apply route 2 (two) times daily.  90 each  3  . isosorbide mononitrate (IMDUR) 30 MG 24 hr tablet Take 1 tablet (30 mg total) by mouth daily. HOLD UNTIL ABLE TO SWALLOW PILLS WHOLE      . lisinopril (PRINIVIL,ZESTRIL) 40 MG tablet Take 40 mg by mouth at bedtime.      Marland Kitchen loratadine (CLARITIN REDITABS) 10 MG dissolvable tablet  Take 10 mg by mouth daily as needed for allergies.      . Multiple Vitamin (MULTIVITAMIN) capsule Take 1 capsule by mouth daily. HOLD UNTIL ABLE TO SWALLOW PILLS WHOLE      . nitroGLYCERIN (NITROSTAT) 0.4 MG SL tablet Place 1 tablet (0.4 mg total) under the tongue every 5 (five) minutes as needed for chest pain.  25 tablet  2  . omeprazole (PRILOSEC) 20 MG capsule Take 1 capsule (20 mg total) by mouth 2 (two) times daily before a meal. HOLD FOR 2 WEEKS      . simvastatin (ZOCOR) 40 MG tablet Take 1 tablet (40 mg total) by mouth at bedtime. HOLD UNTIL ABLE TO SWALLOW PILLS WHOLE  90 tablet  3  . spironolactone (ALDACTONE) 25 MG tablet Take 25 mg by mouth daily.      . Pancrelipase, Lip-Prot-Amyl, 36000 UNITS CPEP  Take 2 capsules by mouth 3 (three) times daily with meals. HOLD UNTIL ABLE TO SWALLOW PILLS WHOLE       No current facility-administered medications for this visit.    Past Medical History  Diagnosis Date  . Hyperlipemia   . Hypertension   . Pancreatitis 2000's  . Hepatic lesion 02/04/11  . GERD (gastroesophageal reflux disease)   . CAD (coronary artery disease)     last cath by [facility] DR.  Tahara Ruffini showing  some  disease involving LCX and small size of Diag   . NSTEMI (non-ST elevated myocardial infarction) 11/21/2009  . Shortness of breath   . CHF exacerbation, due to diastolic dysfunction 95/28/4132  . Ischemic cardiomyopathy     EF 35-40%  . Liver hemangioma   . Type II diabetes mellitus     Past Surgical History  Procedure Laterality Date  . Incision and drainage abscess anal  1970's  . Cardiac catheterization  2011    minimal disease, medical management  . Coronary angioplasty with stent placement  07/14/2012    successful PCI & stenting of mid LAD & PDA off the dominant CX    Family History  Problem Relation Age of Onset  . Diabetes Paternal Grandmother   . Diabetes Maternal Grandfather   . Colon cancer Neg Hx     History   Social History  . Marital Status: Single    Spouse Name: N/A    Number of Children: 0  . Years of Education: N/A   Occupational History  . [employer]   .  [employer]   Social History Main Topics  . Smoking status: Never Smoker   . Smokeless tobacco: Never Used  . Alcohol Use: No  . Drug Use: No  . Sexual Activity: No   Other Topics Concern  . Not on file   Social History Narrative  . No narrative on file    Review of systems: The patient specifically denies any chest pain at rest or with exertion, dyspnea at rest or with exertion, orthopnea, paroxysmal nocturnal dyspnea, syncope, palpitations, focal neurological deficits, intermittent claudication, lower extremity edema, unexplained weight gain, cough, hemoptysis or  wheezing.  The patient also denies abdominal pain, nausea, vomiting, dysphagia, diarrhea, constipation, polyuria, polydipsia, dysuria, hematuria, frequency, urgency, abnormal bleeding or bruising, fever, chills, unexpected weight changes, mood swings, change in skin or hair texture, change in voice quality, auditory or visual problems, allergic reactions or rashes, new musculoskeletal complaints other than usual "aches and pains".     PHYSICAL EXAM BP 132/78  Pulse 94  Resp 16  Ht 6' (1.829  m)  Wt 182 lb 4.8 oz (82.691 kg)  BMI 24.72 kg/m2 General: Alert, oriented x3, no distress  Head: no evidence of trauma, PERRL, EOMI, no exophtalmos or lid lag, no myxedema, no xanthelasma; normal ears, nose and oropharynx  Neck: normal jugular venous pulsations and no hepatojugular reflux; brisk carotid pulses without delay and no carotid bruits  Chest: clear to auscultation, no signs of consolidation by percussion or palpation, normal fremitus, symmetrical and full respiratory excursions  Cardiovascular: normal position and quality of the apical impulse, regular rhythm, normal first and second heart sounds, no murmurs, rubs or gallops  Abdomen: no tenderness or distention, no masses by palpation, no abnormal pulsatility or arterial bruits, normal bowel sounds, no hepatosplenomegaly  Extremities: no clubbing, cyanosis or edema; 2+ radial, ulnar and brachial pulses bilaterally; 2+ right femoral, posterior tibial and dorsalis pedis pulses; 2+ left femoral, posterior tibial and dorsalis pedis pulses; no subclavian or femoral bruits  Neurological: grossly nonfocal   EKG: Normal sinus rhythm, left atrial abnormality, Q waves in leads V1 and V2  Lipid Panel     Component Value Date/Time   CHOL 66 03/01/2011 1602   TRIG 37.0 03/01/2011 1602   HDL 24.40* 03/01/2011 1602   CHOLHDL 3 03/01/2011 1602   VLDL 7.4 03/01/2011 1602   LDLCALC 34 03/01/2011 1602    BMET    Component Value Date/Time   NA 141 01/13/2014  0841   K 3.8 01/13/2014 0841   CL 104 01/13/2014 0841   CO2 19 01/13/2014 0841   GLUCOSE 233* 01/13/2014 0841   BUN 10 01/13/2014 0841   CREATININE 0.60 01/13/2014 0841   CREATININE 0.78 09/14/2013 0820   CALCIUM 8.5 01/13/2014 0841   GFRNONAA >90 01/13/2014 0841   GFRAA >90 01/13/2014 0841     ASSESSMENT AND PLAN  From a coronary standpoint Pesach has not had issues in about 2 years. He has not had recent problems with congestive heart failure and appears to be euvolemic, NYHA functional class I. The major cardiovascular problem that remains unsatisfactorily resolved is his followup blood pressure. Despite multiple attempts at adjusting his medications, we have yet to find a regimen to maintain smooth blood pressure control. I think the "as needed" dosing of her morning beta blocker that he has empirically derived seems to be as good as anything that I have suggested. We'll continue with the same treatment pattern. His recent significant weight loss makes it even harder to make recommendations. We will have to see him on a frequent basis.  Patient Instructions  Your physician recommends that you schedule a follow-up appointment in: 1 Month      Orders Placed This Encounter  Procedures  . EKG 12-Lead   No orders of the defined types were placed in this encounter.    Danyella Mcginty  [name], MD, [facility] [phone] office [phone] pager

## 2014-01-27 ENCOUNTER — Encounter: Payer: Self-pay | Admitting: Cardiovascular Disease

## 2014-01-31 ENCOUNTER — Other Ambulatory Visit: Payer: Self-pay | Admitting: Internal Medicine

## 2014-02-04 ENCOUNTER — Other Ambulatory Visit: Payer: Self-pay | Admitting: Internal Medicine

## 2014-02-06 ENCOUNTER — Other Ambulatory Visit: Payer: Self-pay | Admitting: Endocrinology

## 2014-03-02 ENCOUNTER — Other Ambulatory Visit (HOSPITAL_COMMUNITY): Payer: Self-pay | Admitting: Cardiovascular Disease

## 2014-03-04 ENCOUNTER — Encounter: Payer: Self-pay | Admitting: Cardiovascular Disease

## 2014-03-04 ENCOUNTER — Ambulatory Visit (INDEPENDENT_AMBULATORY_CARE_PROVIDER_SITE_OTHER): Payer: 59 | Admitting: Cardiovascular Disease

## 2014-03-04 VITALS — BP 123/73 | HR 80 | Resp 16 | Ht 72.0 in | Wt 183.3 lb

## 2014-03-04 DIAGNOSIS — Z8719 Personal history of other diseases of the digestive system: Secondary | ICD-10-CM

## 2014-03-04 DIAGNOSIS — K8689 Other specified diseases of pancreas: Secondary | ICD-10-CM

## 2014-03-04 DIAGNOSIS — I255 Ischemic cardiomyopathy: Secondary | ICD-10-CM

## 2014-03-04 DIAGNOSIS — I2589 Other forms of chronic ischemic heart disease: Secondary | ICD-10-CM

## 2014-03-04 DIAGNOSIS — I2510 Atherosclerotic heart disease of native coronary artery without angina pectoris: Secondary | ICD-10-CM

## 2014-03-04 DIAGNOSIS — E108 Type 1 diabetes mellitus with unspecified complications: Secondary | ICD-10-CM

## 2014-03-04 DIAGNOSIS — E785 Hyperlipidemia, unspecified: Secondary | ICD-10-CM

## 2014-03-04 DIAGNOSIS — I10 Essential (primary) hypertension: Secondary | ICD-10-CM

## 2014-03-04 MED ORDER — CARVEDILOL 12.5 MG PO TABS: ORAL_TABLET | ORAL | Status: DC

## 2014-03-04 NOTE — Patient Instructions (Signed)
Change Carvedilol to 12.5mg  in the morning and 1/2 in the PM.  Dr. Sallyanne Kuster recommends that you schedule a follow-up appointment in: 3 months.

## 2014-03-04 NOTE — Telephone Encounter (Signed)
Rx was sent to pharmacy electronically. 

## 2014-03-11 ENCOUNTER — Encounter: Payer: Self-pay | Admitting: Cardiovascular Disease

## 2014-03-11 NOTE — Progress Notes (Signed)
Patient ID: David Lane, male   DOB: April 09, 1946, 68 y.o.   MRN: [id number]      Reason for office visit HTN, CAD, DM type I  Andra continues to have rather volatile BP, sometimes with mild symptomatic hypotension. Usually his BP is "OK", lowest recently recirded was 95/60, occasionally SBP >160.Similarly, glycemic control varies, often with capillary glucose > 300. Often has diarrhea, despite pancreatic enzyme supplements.  He usually does not take the evening dose of carvedilol (rescribed 25 mg) and intermittently takes the morning doe (prescribed 12.5 mg) if the AM BP is high.  He has a history of coronary disease and received a stent to the LAD artery and a stent to the posterior descending artery in 2013. He has moderately depressed left ventricular systolic function with an ejection fraction of 35-40% and has previously presented with pulmonary edema secondary to heart failure. A repeat echocardiogram on 08/01/13 showed an improved ejection fraction of 50-55% with mild concentric hypertrophy. He has very brittle diabetes mellitus related to pancreatic insufficiency following acute pancreatitis.  Allergies  Allergen Reactions  . Lipitor [Atorvastatin] Other (See Comments)    Muscle weakness    Current Outpatient Prescriptions  Medication Sig Dispense Refill  . ACCU-CHEK SOFTCLIX LANCETS lancets Use as instructed  100 each  12  . acetaminophen (TYLENOL) 160 MG/5ML solution Take 20.3 mLs (650 mg total) by mouth every 6 (six) hours as needed for mild pain.  120 mL  0  . amLODipine (NORVASC) 5 MG tablet Take 5 mg by mouth at bedtime.       Marland Kitchen aspirin 81 MG tablet Take 81 mg by mouth daily.        . carvedilol (COREG) 12.5 MG tablet Take one tablet in the morning and 1/2 tablet in the PM.  45 tablet  6  . carvedilol (COREG) 25 MG tablet Take 25 mg by mouth as needed.      . clindamycin (CLEOCIN) 75 MG/5ML solution Take 20 mLs (300 mg total) by mouth every 6 (six) hours. For 7 days   600 mL  0  . furosemide (LASIX) 40 MG tablet Take 40 mg by mouth daily.      Marland Kitchen glucose blood (ONE TOUCH ULTRA TEST) test strip 1 each by Other route 2 (two) times daily. And lancets 2/day 250.01  100 each  12  . glucose blood test strip Use as instructed  100 each  12  . insulin aspart (NOVOLOG) 100 UNIT/ML injection Inject 30-40 Units into the skin daily. Per sliding scale      . Insulin Disposable Pump (V-GO 20) KIT 1 Device by Does not apply route daily.  30 kit  11  . Insulin Pen Needle (PEN NEEDLES) 31G X 6 MM MISC 1 Device by Does not apply route 2 (two) times daily.  90 each  3  . isosorbide mononitrate (IMDUR) 30 MG 24 hr tablet Take 1 tablet (30 mg total) by mouth daily. HOLD UNTIL ABLE TO SWALLOW PILLS WHOLE      . lisinopril (PRINIVIL,ZESTRIL) 40 MG tablet Take 40 mg by mouth at bedtime.      Marland Kitchen loratadine (CLARITIN REDITABS) 10 MG dissolvable tablet Take 10 mg by mouth daily as needed for allergies.      . Multiple Vitamin (MULTIVITAMIN) capsule Take 1 capsule by mouth daily. HOLD UNTIL ABLE TO SWALLOW PILLS WHOLE      . nitroGLYCERIN (NITROSTAT) 0.4 MG SL tablet Place 1 tablet (0.4 mg total) under the tongue  every 5 (five) minutes as needed for chest pain.  25 tablet  2  . omeprazole (PRILOSEC) 20 MG capsule TAKE ONE CAPSULE TWICE A DAY  180 capsule  0  . Pancrelipase, Lip-Prot-Amyl, 36000 UNITS CPEP Take 2 capsules by mouth 3 (three) times daily with meals. HOLD UNTIL ABLE TO SWALLOW PILLS WHOLE      . simvastatin (ZOCOR) 40 MG tablet Take 1 tablet (40 mg total) by mouth at bedtime. HOLD UNTIL ABLE TO SWALLOW PILLS WHOLE  90 tablet  3  . spironolactone (ALDACTONE) 25 MG tablet Take 25 mg by mouth daily.      . isosorbide mononitrate (IMDUR) 30 MG 24 hr tablet TAKE 1 TABLET (30 MG TOTAL) BY MOUTH DAILY.  30 tablet  11   No current facility-administered medications for this visit.    Past Medical History  Diagnosis Date  . Hyperlipemia   . Hypertension   . Pancreatitis 2000's  .  Hepatic lesion 02/04/11  . GERD (gastroesophageal reflux disease)   . CAD (coronary artery disease)     last cath by [facility] DR.  Jamesmichael Shadd showing  some  disease involving LCX and small size of Diag   . NSTEMI (non-ST elevated myocardial infarction) 11/21/2009  . Shortness of breath   . CHF exacerbation, due to diastolic dysfunction 67/20/9470  . Ischemic cardiomyopathy     EF 35-40%  . Liver hemangioma   . Type II diabetes mellitus     Past Surgical History  Procedure Laterality Date  . Incision and drainage abscess anal  1970's  . Cardiac catheterization  2011    minimal disease, medical management  . Coronary angioplasty with stent placement  07/14/2012    successful PCI & stenting of mid LAD & PDA off the dominant CX    Family History  Problem Relation Age of Onset  . Diabetes Paternal Grandmother   . Diabetes Maternal Grandfather   . Colon cancer Neg Hx     History   Social History  . Marital Status: Single    Spouse Name: N/A    Number of Children: 0  . Years of Education: N/A   Occupational History  . [employer]   .  [employer]   Social History Main Topics  . Smoking status: Never Smoker   . Smokeless tobacco: Never Used  . Alcohol Use: No  . Drug Use: No  . Sexual Activity: No   Other Topics Concern  . Not on file   Social History Narrative  . No narrative on file    Review of systems: The patient specifically denies any chest pain at rest or with exertion, dyspnea at rest or with exertion, orthopnea, paroxysmal nocturnal dyspnea, syncope, palpitations, focal neurological deficits, intermittent claudication, lower extremity edema, unexplained weight gain, cough, hemoptysis or wheezing.  The patient also denies abdominal pain, nausea, vomiting, dysphagia, diarrhea, constipation, polyuria, polydipsia, dysuria, hematuria, frequency, urgency, abnormal bleeding or bruising, fever, chills, unexpected weight changes, mood swings, change in skin or hair  texture, change in voice quality, auditory or visual problems, allergic reactions or rashes, new musculoskeletal complaints other than usual "aches and pains".    PHYSICAL EXAM BP 123/73  Pulse 80  Resp 16  Ht 6' (1.829 m)  Wt 183 lb 4.8 oz (83.144 kg)  BMI 24.85 kg/m2 General: Alert, oriented x3, no distress  Head: no evidence of trauma, PERRL, EOMI, no exophtalmos or lid lag, no myxedema, no xanthelasma; normal ears, nose and oropharynx  Neck:  normal jugular venous pulsations and no hepatojugular reflux; brisk carotid pulses without delay and no carotid bruits  Chest: clear to auscultation, no signs of consolidation by percussion or palpation, normal fremitus, symmetrical and full respiratory excursions  Cardiovascular: normal position and quality of the apical impulse, regular rhythm, normal first and second heart sounds, no murmurs, rubs or gallops  Abdomen: no tenderness or distention, no masses by palpation, no abnormal pulsatility or arterial bruits, normal bowel sounds, no hepatosplenomegaly  Extremities: no clubbing, cyanosis or edema; 2+ radial, ulnar and brachial pulses bilaterally; 2+ right femoral, posterior tibial and dorsalis pedis pulses; 2+ left femoral, posterior tibial and dorsalis pedis pulses; no subclavian or femoral bruits  Neurological: grossly nonfocal  EKG: Normal sinus rhythm, left atrial abnormality, Q waves in leads V1 and V2   Lipid Panel     Component Value Date/Time   CHOL 66 03/01/2011 1602   TRIG 37.0 03/01/2011 1602   HDL 24.40* 03/01/2011 1602   CHOLHDL 3 03/01/2011 1602   VLDL 7.4 03/01/2011 1602   LDLCALC 34 03/01/2011 1602    BMET    Component Value Date/Time   NA 141 01/13/2014 0841   K 3.8 01/13/2014 0841   CL 104 01/13/2014 0841   CO2 19 01/13/2014 0841   GLUCOSE 233* 01/13/2014 0841   BUN 10 01/13/2014 0841   CREATININE 0.60 01/13/2014 0841   CREATININE 0.78 09/14/2013 0820   CALCIUM 8.5 01/13/2014 0841   GFRNONAA >90 01/13/2014 0841   GFRAA >90  01/13/2014 0841     ASSESSMENT AND PLAN  No symptoms of active coronary insufficiency or CHF.  BP control still erratic, but a little smoother. I wonder whether variable medication absorption due to pancreatic exocrine insufficiency may be playing a role in medication effectiveness. Will try carvedilol 12.5 mg QAM and 6.25 mg QPM. Take consistently with same amount/type of food to see if this helps generate a more uniform BP control.  Excellent lipid profile.  Patient Instructions  Change Carvedilol to 12.5m in the morning and 1/2 in the PM.  Dr. CSallyanne Kusterrecommends that you schedule a follow-up appointment in: 3 months.       No orders of the defined types were placed in this encounter.   Meds ordered this encounter  Medications  . carvedilol (COREG) 25 MG tablet    Sig: Take 25 mg by mouth as needed.  . carvedilol (COREG) 12.5 MG tablet    Sig: Take one tablet in the morning and 1/2 tablet in the PM.    Dispense:  45 tablet    Refill:  6HopewellCroitoru, MD, [phone] [phone]

## 2014-04-04 ENCOUNTER — Other Ambulatory Visit (HOSPITAL_COMMUNITY): Payer: Self-pay | Admitting: Cardiovascular Disease

## 2014-05-24 ENCOUNTER — Telehealth: Payer: Self-pay

## 2014-05-24 NOTE — Telephone Encounter (Signed)
Diabetic Bundle requested call back to schedule with Dr. Loanne Drilling.

## 2014-05-27 ENCOUNTER — Other Ambulatory Visit: Payer: Self-pay | Admitting: Endocrinology

## 2014-05-27 NOTE — Telephone Encounter (Signed)
Please advise if ok to refill. Medication is listed under historical provider, Thanks!

## 2014-05-27 NOTE — Telephone Encounter (Signed)
Please refill x 1 Ov is due  

## 2014-05-28 NOTE — Telephone Encounter (Signed)
Refill sent. Pt notified that appointment is needed for further refills.

## 2014-06-06 ENCOUNTER — Encounter: Payer: Self-pay | Admitting: Cardiovascular Disease

## 2014-06-06 ENCOUNTER — Ambulatory Visit (INDEPENDENT_AMBULATORY_CARE_PROVIDER_SITE_OTHER): Payer: 59 | Admitting: Cardiovascular Disease

## 2014-06-06 VITALS — BP 122/80 | HR 89 | Ht 72.0 in | Wt 181.0 lb

## 2014-06-06 DIAGNOSIS — I255 Ischemic cardiomyopathy: Secondary | ICD-10-CM

## 2014-06-06 DIAGNOSIS — I509 Heart failure, unspecified: Secondary | ICD-10-CM

## 2014-06-06 DIAGNOSIS — I10 Essential (primary) hypertension: Secondary | ICD-10-CM

## 2014-06-06 DIAGNOSIS — I2510 Atherosclerotic heart disease of native coronary artery without angina pectoris: Secondary | ICD-10-CM

## 2014-06-06 DIAGNOSIS — I160 Hypertensive urgency: Secondary | ICD-10-CM

## 2014-06-06 DIAGNOSIS — E785 Hyperlipidemia, unspecified: Secondary | ICD-10-CM

## 2014-06-06 DIAGNOSIS — I2589 Other forms of chronic ischemic heart disease: Secondary | ICD-10-CM

## 2014-06-06 DIAGNOSIS — I5042 Chronic combined systolic (congestive) and diastolic (congestive) heart failure: Secondary | ICD-10-CM

## 2014-06-06 MED ORDER — NEBIVOLOL HCL 10 MG PO TABS: 10.0000 mg | ORAL_TABLET | Freq: Every day | ORAL | Status: DC

## 2014-06-06 NOTE — Patient Instructions (Signed)
STOP Isosorbide.  STOP Carvedilol.  START Bystolic 10mg  daily.  Dr. Sallyanne Kuster recommends that you schedule a follow-up appointment in: 3-4 weeks.

## 2014-06-07 NOTE — Progress Notes (Signed)
Patient ID: David Lane, male   DOB: 10-08-1945, 68 y.o.   MRN: [id number]      Reason for office visit CAD, mild ischemic cardiomyopathy, chronic diastolic heart failure, severe  David Lane returns primarily for her management of his blood pressure medications. We have made numerous and frequent adjustments in his medications and has been a challenge to find a combination of drugs to provide both a good blood pressure control and good symptom relief.  He continues to have elevated blood pressure when he wakes up in the morning, typically around 160-170 mm Hg. He takes his medications early in the day, around 6 AM. A couple of hours later, while at work he suddenly experiences weakness and a daily sensation in his head. He has not had syncope. 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 He has not recorded low blood pressure when he has these complaints. Interpretation of his symptoms is difficult, since she also has broad swings in blood sugar levels, between 140 and 350, around the same time of day.  He has not had exertional angina or dyspnea. He denies lower extremity edema or palpitations. Previous arrhythmia monitors have not shown any evidence of significant rhythm abnormalities, and there was normal sinus rhythm during his spells of lightheadedness.   He has a history of coronary disease and received a stent to the LAD artery and a stent to the posterior descending artery in 2013. He has moderately depressed left ventricular systolic function with an ejection fraction of 35-40% and has previously presented with pulmonary edema secondary to heart failure. A repeat echocardiogram on 08/01/13 showed an improved ejection fraction of 50-55% with mild concentric hypertrophy. He has very brittle diabetes mellitus related to pancreatic insufficiency following acute pancreatitis.  Allergies  Allergen Reactions  .  Lipitor [Atorvastatin] Other (See Comments)    Muscle weakness    Current Outpatient Prescriptions  Medication Sig Dispense Refill  . ACCU-CHEK SOFTCLIX LANCETS lancets Use as instructed  100 each  12  . acetaminophen (TYLENOL) 160 MG/5ML solution Take 20.3 mLs (650 mg total) by mouth every 6 (six) hours as needed for mild pain.  120 mL  0  . amLODipine (NORVASC) 5 MG tablet Take 5 mg by mouth at bedtime.       Marland Kitchen aspirin 81 MG tablet Take 81 mg by mouth daily.        . furosemide (LASIX) 40 MG tablet Take 40 mg by mouth daily.      Marland Kitchen glucose blood (ONE TOUCH ULTRA TEST) test strip 1 each by Other route 2 (two) times daily. And lancets 2/day 250.01  100 each  12  . glucose blood test strip Use as instructed  100 each  12  . insulin aspart (NOVOLOG) 100 UNIT/ML injection Inject 30-40 Units into the skin daily. Per sliding scale      . Insulin Disposable Pump (V-GO 20) KIT 1 Device by Does not apply route daily.  30 kit  11  . Insulin Pen Needle (PEN NEEDLES) 31G X 6 MM MISC 1 Device by Does not apply route 2 (two) times daily.  90 each  3  . LANTUS SOLOSTAR 100 UNIT/ML Solostar Pen       . lisinopril (PRINIVIL,ZESTRIL) 40 MG tablet Take 40 mg by mouth at bedtime.      Marland Kitchen loratadine (CLARITIN REDITABS) 10 MG dissolvable tablet Take 10 mg by mouth  daily as needed for allergies.      . Multiple Vitamin (MULTIVITAMIN) capsule Take 1 capsule by mouth daily. HOLD UNTIL ABLE TO SWALLOW PILLS WHOLE      . nitroGLYCERIN (NITROSTAT) 0.4 MG SL tablet Place 1 tablet (0.4 mg total) under the tongue every 5 (five) minutes as needed for chest pain.  25 tablet  2  . omeprazole (PRILOSEC) 20 MG capsule TAKE ONE CAPSULE TWICE A DAY  180 capsule  0  . Pancrelipase, Lip-Prot-Amyl, 36000 UNITS CPEP Take 2 capsules by mouth 3 (three) times daily with meals. HOLD UNTIL ABLE TO SWALLOW PILLS WHOLE      . simvastatin (ZOCOR) 40 MG tablet Take 1 tablet (40 mg total) by mouth at bedtime. HOLD UNTIL ABLE TO SWALLOW PILLS  WHOLE  90 tablet  3  . spironolactone (ALDACTONE) 25 MG tablet Take 25 mg by mouth daily.      . nebivolol (BYSTOLIC) 10 MG tablet Take 1 tablet (10 mg total) by mouth daily.  28 tablet  0   No current facility-administered medications for this visit.    Past Medical History  Diagnosis Date  . Hyperlipemia   . Hypertension   . Pancreatitis 2000's  . Hepatic lesion 02/04/11  . GERD (gastroesophageal reflux disease)   . CAD (coronary artery disease)     last cath by [facility] DR.  Karlye Lane showing  some  disease involving LCX and small size of Diag   . NSTEMI (non-ST elevated myocardial infarction) 11/21/2009  . Shortness of breath   . CHF exacerbation, due to diastolic dysfunction 16/06/9603  . Ischemic cardiomyopathy     EF 35-40%  . Liver hemangioma   . Type II diabetes mellitus     Past Surgical History  Procedure Laterality Date  . Incision and drainage abscess anal  1970's  . Cardiac catheterization  2011    minimal disease, medical management  . Coronary angioplasty with stent placement  07/14/2012    successful PCI & stenting of mid LAD & PDA off the dominant CX    Family History  Problem Relation Age of Onset  . Diabetes Paternal Grandmother   . Diabetes Maternal Grandfather   . Colon cancer Neg Hx     History   Social History  . Marital Status: Single    Spouse Name: N/A    Number of Children: 0  . Years of Education: N/A   Occupational History  . [employer]   .  [employer]   Social History Main Topics  . Smoking status: Never Smoker   . Smokeless tobacco: Never Used  . Alcohol Use: No  . Drug Use: No  . Sexual Activity: No   Other Topics Concern  . Not on file   Social History Narrative  . No narrative on file    Review of systems: The patient specifically denies any chest pain at rest or with exertion, dyspnea at rest or with exertion, orthopnea, paroxysmal nocturnal dyspnea, syncope, palpitations, focal neurological deficits,  intermittent claudication, lower extremity edema, unexplained weight gain, cough, hemoptysis or wheezing.  The patient also denies abdominal pain, nausea, vomiting, dysphagia, diarrhea, constipation, polyuria, polydipsia, dysuria, hematuria, frequency, urgency, abnormal bleeding or bruising, fever, chills, unexpected weight changes, mood swings, change in skin or hair texture, change in voice quality, auditory or visual problems, allergic reactions or rashes, new musculoskeletal complaints other than usual "aches and pains".   PHYSICAL EXAM BP 122/80  Pulse 89  Ht 6' (1.829 m)  Wt 181 lb (82.101 kg)  BMI 24.54 kg/m2  General: Alert, oriented x3, no distress Head: no evidence of trauma, PERRL, EOMI, no exophtalmos or lid lag, no myxedema, no xanthelasma; normal ears, nose and oropharynx Neck: normal jugular venous pulsations and no hepatojugular reflux; brisk carotid pulses without delay and no carotid bruits Chest: clear to auscultation, no signs of consolidation by percussion or palpation, normal fremitus, symmetrical and full respiratory excursions Cardiovascular: normal position and quality of the apical impulse, regular rhythm, normal first and second heart sounds, no murmurs, rubs or gallops Abdomen: no tenderness or distention, no masses by palpation, no abnormal pulsatility or arterial bruits, normal bowel sounds, no hepatosplenomegaly Extremities: no clubbing, cyanosis or edema; 2+ radial, ulnar and brachial pulses bilaterally; 2+ right femoral, posterior tibial and dorsalis pedis pulses; 2+ left femoral, posterior tibial and dorsalis pedis pulses; no subclavian or femoral bruits Neurological: grossly nonfocal   EKG: Normal sinus rhythm, left atrial abnormality, QS pattern in the V1-V2 (old)  Lipid Panel     Component Value Date/Time   CHOL 66 03/01/2011 1602   TRIG 37.0 03/01/2011 1602   HDL 24.40* 03/01/2011 1602   CHOLHDL 3 03/01/2011 1602   VLDL 7.4 03/01/2011 1602   LDLCALC 34  03/01/2011 1602    BMET    Component Value Date/Time   NA 141 01/13/2014 0841   K 3.8 01/13/2014 0841   CL 104 01/13/2014 0841   CO2 19 01/13/2014 0841   GLUCOSE 233* 01/13/2014 0841   BUN 10 01/13/2014 0841   CREATININE 0.60 01/13/2014 0841   CREATININE 0.78 09/14/2013 0820   CALCIUM 8.5 01/13/2014 0841   GFRNONAA >90 01/13/2014 0841   GFRAA >90 01/13/2014 0841     ASSESSMENT AND PLAN Ischemic cardiomyopathy, EF 50% by echo 08/01/13 Appears clinically euvolemic, NYHA functional class I  Chronic combined systolic and diastolic CHF (congestive heart failure) Continue same diuretic dose  CAD, 07/14/12- LAD/PDA DES Angina free, despite sometimes intense physical activity. CCS class I  HYPERLIPIDEMIA Excellent lipid profile despite volatile control of his diabetes  HYPERTENSION Today in the office his blood pressure is excellent. He needs to continue to take ACE inhibitors and beta blockers for his cardiac problems. I wonder whether the relatively short activity of carvedilol may be part of the variability in blood pressure control. As before, I am concerned that unpredictable absorption of his drugs (due to pancreatic insufficiency) may have something to do with the variable blood pressure control. Will try to see if using a longer acting agent such as Bystolic revised smoother blood pressure control with fewer episodes of sudden dizziness. Gave him enough samples of 10 mg tablets to last him for 4 weeks and we'll see him back in the office before they run out.    Orders Placed This Encounter  Procedures  . EKG 12-Lead   Meds ordered this encounter  Medications  . LANTUS SOLOSTAR 100 UNIT/ML Solostar Pen    Sig:   . nebivolol (BYSTOLIC) 10 MG tablet    Sig: Take 1 tablet (10 mg total) by mouth daily.    Dispense:  28 tablet    Refill:  0    LOT #[id number]  EXP: 4/16    Holli Humbles, MD, [facility] [phone] office [phone] pager

## 2014-06-07 NOTE — Assessment & Plan Note (Signed)
Angina free, despite sometimes intense physical activity. CCS class I

## 2014-06-07 NOTE — Assessment & Plan Note (Signed)
Continue same diuretic dose

## 2014-06-07 NOTE — Assessment & Plan Note (Signed)
Today in the office his blood pressure is excellent. He needs to continue to take ACE inhibitors and beta blockers for his cardiac problems. I wonder whether the relatively short activity of carvedilol may be part of the variability in blood pressure control. As before, I am concerned that unpredictable absorption of his drugs (due to pancreatic insufficiency) may have something to do with the variable blood pressure control. Will try to see if using a longer acting agent such as Bystolic revised smoother blood pressure control with fewer episodes of sudden dizziness. Gave him enough samples of 10 mg tablets to last him for 4 weeks and we'll see him back in the office before they run out.

## 2014-06-07 NOTE — Assessment & Plan Note (Signed)
Appears clinically euvolemic, NYHA functional class I

## 2014-06-07 NOTE — Assessment & Plan Note (Signed)
Excellent lipid profile despite volatile control of his diabetes

## 2014-07-11 ENCOUNTER — Encounter: Payer: Self-pay | Admitting: Cardiovascular Disease

## 2014-07-11 ENCOUNTER — Ambulatory Visit (INDEPENDENT_AMBULATORY_CARE_PROVIDER_SITE_OTHER): Payer: 59 | Admitting: Cardiovascular Disease

## 2014-07-11 VITALS — BP 150/80 | HR 80 | Resp 20 | Ht 72.0 in | Wt 183.7 lb

## 2014-07-11 DIAGNOSIS — I2510 Atherosclerotic heart disease of native coronary artery without angina pectoris: Secondary | ICD-10-CM

## 2014-07-11 DIAGNOSIS — I5042 Chronic combined systolic (congestive) and diastolic (congestive) heart failure: Secondary | ICD-10-CM

## 2014-07-11 DIAGNOSIS — I10 Essential (primary) hypertension: Secondary | ICD-10-CM

## 2014-07-11 DIAGNOSIS — I255 Ischemic cardiomyopathy: Secondary | ICD-10-CM

## 2014-07-11 NOTE — Patient Instructions (Signed)
STOP CARVEDILOL.  START BYSTOLIC 10MG  TAKE 1 1/2 TABLETS EACH MORNING.  Dr. Sallyanne Kuster recommends that you schedule a follow-up appointment in: 4 WEEKS.

## 2014-07-11 NOTE — Progress Notes (Signed)
Patient ID: David Lane, male   DOB: Oct 25, 1945, 68 y.o.   MRN: [id number]     Reason for office visit Hypertension, CAD, chronic combined systolic and diastolic heart failure  Nicko feels symptomatically improved after we discontinued carvedilol and started treatment with Bystolic 10 mg daily. He no longer has lightheadedness and symptoms of near-syncope in the midmorning. His blood pressure appears to be a lot less variable, but unfortunately is a little high, generally in the 150-160/70-80 range.  He has a history of coronary disease and received a stent to the LAD artery and a stent to the posterior descending artery in 2013. He has moderately depressed left ventricular systolic function with an ejection fraction of 35-40% and has previously presented with pulmonary edema secondary to heart failure. A repeat echocardiogram on 08/01/13 showed an improved ejection fraction of 50-55% with mild concentric hypertrophy. He has very brittle diabetes mellitus related to pancreatic insufficiency following acute pancreatitis.  Allergies  Allergen Reactions  . Lipitor [Atorvastatin] Other (See Comments)    Muscle weakness    Current Outpatient Prescriptions  Medication Sig Dispense Refill  . ACCU-CHEK SOFTCLIX LANCETS lancets Use as instructed  100 each  12  . acetaminophen (TYLENOL) 160 MG/5ML solution Take 20.3 mLs (650 mg total) by mouth every 6 (six) hours as needed for mild pain.  120 mL  0  . amLODipine (NORVASC) 5 MG tablet Take 5 mg by mouth at bedtime.       Marland Kitchen aspirin 81 MG tablet Take 81 mg by mouth daily.        . carvedilol (COREG) 12.5 MG tablet Take 12.5 mg by mouth 2 (two) times daily with a meal.      . furosemide (LASIX) 40 MG tablet Take 40 mg by mouth daily.      Marland Kitchen glucose blood (ONE TOUCH ULTRA TEST) test strip 1 each by Other route 2 (two) times daily. And lancets 2/day 250.01  100 each  12  . glucose blood test strip Use as instructed  100 each  12  . LANTUS SOLOSTAR  100 UNIT/ML Solostar Pen Inject 40 Units into the skin daily.       Marland Kitchen lisinopril (PRINIVIL,ZESTRIL) 40 MG tablet Take 40 mg by mouth at bedtime.      Marland Kitchen loratadine (CLARITIN REDITABS) 10 MG dissolvable tablet Take 10 mg by mouth daily as needed for allergies.      . Multiple Vitamin (MULTIVITAMIN) capsule Take 1 capsule by mouth daily. HOLD UNTIL ABLE TO SWALLOW PILLS WHOLE      . nitroGLYCERIN (NITROSTAT) 0.4 MG SL tablet Place 1 tablet (0.4 mg total) under the tongue every 5 (five) minutes as needed for chest pain.  25 tablet  2  . omeprazole (PRILOSEC) 20 MG capsule TAKE ONE CAPSULE TWICE A DAY  180 capsule  0  . Pancrelipase, Lip-Prot-Amyl, 36000 UNITS CPEP Take 2 capsules by mouth 3 (three) times daily with meals. HOLD UNTIL ABLE TO SWALLOW PILLS WHOLE      . simvastatin (ZOCOR) 40 MG tablet Take 1 tablet (40 mg total) by mouth at bedtime. HOLD UNTIL ABLE TO SWALLOW PILLS WHOLE  90 tablet  3  . spironolactone (ALDACTONE) 25 MG tablet Take 25 mg by mouth daily.       No current facility-administered medications for this visit.    Past Medical History  Diagnosis Date  . Hyperlipemia   . Hypertension   . Pancreatitis 2000's  . Hepatic lesion 02/04/11  . GERD (  gastroesophageal reflux disease)   . CAD (coronary artery disease)     last cath by [facility] DR.  Lowell Mcgurk showing  some  disease involving LCX and small size of Diag   . NSTEMI (non-ST elevated myocardial infarction) 11/21/2009  . Shortness of breath   . CHF exacerbation, due to diastolic dysfunction 87/86/7672  . Ischemic cardiomyopathy     EF 35-40%  . Liver hemangioma   . Type II diabetes mellitus     Past Surgical History  Procedure Laterality Date  . Incision and drainage abscess anal  1970's  . Cardiac catheterization  2011    minimal disease, medical management  . Coronary angioplasty with stent placement  07/14/2012    successful PCI & stenting of mid LAD & PDA off the dominant CX    Family History  Problem  Relation Age of Onset  . Diabetes Paternal Grandmother   . Diabetes Maternal Grandfather   . Colon cancer Neg Hx     History   Social History  . Marital Status: Single    Spouse Name: N/A    Number of Children: 0  . Years of Education: N/A   Occupational History  . [employer]   .  [employer]   Social History Main Topics  . Smoking status: Never Smoker   . Smokeless tobacco: Never Used  . Alcohol Use: No  . Drug Use: No  . Sexual Activity: No   Other Topics Concern  . Not on file   Social History Narrative  . No narrative on file    Review of systems: The patient specifically denies any chest pain at rest or with exertion, dyspnea at rest or with exertion, orthopnea, paroxysmal nocturnal dyspnea, syncope, palpitations, focal neurological deficits, intermittent claudication, lower extremity edema, unexplained weight gain, cough, hemoptysis or wheezing.  The patient also denies abdominal pain, nausea, vomiting, dysphagia, diarrhea, constipation, polyuria, polydipsia, dysuria, hematuria, frequency, urgency, abnormal bleeding or bruising, fever, chills, unexpected weight changes, mood swings, change in skin or hair texture, change in voice quality, auditory or visual problems, allergic reactions or rashes, new musculoskeletal complaints other than usual "aches and pains".   PHYSICAL EXAM BP 150/80  Pulse 80  Resp 20  Ht 6' (1.829 m)  Wt 83.326 kg (183 lb 11.2 oz)  BMI 24.91 kg/m2 General: Alert, oriented x3, no distress  Head: no evidence of trauma, PERRL, EOMI, no exophtalmos or lid lag, no myxedema, no xanthelasma; normal ears, nose and oropharynx  Neck: normal jugular venous pulsations and no hepatojugular reflux; brisk carotid pulses without delay and no carotid bruits  Chest: clear to auscultation, no signs of consolidation by percussion or palpation, normal fremitus, symmetrical and full respiratory excursions  Cardiovascular: normal position and quality of the  apical impulse, regular rhythm, normal first and second heart sounds, no murmurs, rubs or gallops  Abdomen: no tenderness or distention, no masses by palpation, no abnormal pulsatility or arterial bruits, normal bowel sounds, no hepatosplenomegaly  Extremities: no clubbing, cyanosis or edema; 2+ radial, ulnar and brachial pulses bilaterally; 2+ right femoral, posterior tibial and dorsalis pedis pulses; 2+ left femoral, posterior tibial and dorsalis pedis pulses; no subclavian or femoral bruits  Neurological: grossly nonfocal   EKG: EKG: Normal sinus rhythm, left atrial abnormality, QS pattern in V1-V2 (old)   Lipid Panel     Component Value Date/Time   CHOL 66 03/01/2011 1602   TRIG 37.0 03/01/2011 1602   HDL 24.40* 03/01/2011 1602   CHOLHDL 3  03/01/2011 1602   VLDL 7.4 03/01/2011 1602   LDLCALC 34 03/01/2011 1602    BMET    Component Value Date/Time   NA 141 01/13/2014 0841   K 3.8 01/13/2014 0841   CL 104 01/13/2014 0841   CO2 19 01/13/2014 0841   GLUCOSE 233* 01/13/2014 0841   BUN 10 01/13/2014 0841   CREATININE 0.60 01/13/2014 0841   CREATININE 0.78 09/14/2013 0820   CALCIUM 8.5 01/13/2014 0841   GFRNONAA >90 01/13/2014 0841   GFRAA >90 01/13/2014 0841     ASSESSMENT AND PLAN Ischemic cardiomyopathy, EF 50% by echo 08/01/13  Appears clinically euvolemic, NYHA functional class I  Chronic combined systolic and diastolic CHF (congestive heart failure)  Continue same diuretic dose  CAD, 07/14/12- LAD/PDA DES  Angina free, despite sometimes intense physical activity. CCS class I  HYPERLIPIDEMIA  Excellent lipid profile despite volatile control of his diabetes  HYPERTENSION  Seems to feel much better using a longer acting agent such as Bystolic: smoother blood pressure control,  fewer episodes of sudden dizziness compared with carvedilol.  His blood pressure is too high though, will increase the Bystolic to 15 mg daily. I hope he can handle the extra cost of this  medication.  Holli Humbles, MD, [place] [phone] office [phone] pager

## 2014-07-19 ENCOUNTER — Other Ambulatory Visit: Payer: Self-pay | Admitting: *Deleted

## 2014-07-19 ENCOUNTER — Telehealth: Payer: Self-pay | Admitting: Cardiovascular Disease

## 2014-07-19 MED ORDER — NEBIVOLOL HCL 5 MG PO TABS: 15.0000 mg | ORAL_TABLET | Freq: Every day | ORAL | Status: DC

## 2014-07-19 NOTE — Telephone Encounter (Signed)
Pt.s Bystolic ordered per Dr. Sallyanne Kuster instructions on last office note

## 2014-07-19 NOTE — Telephone Encounter (Signed)
David Lane was calling in regards to a refill request for [name]

## 2014-07-31 ENCOUNTER — Encounter: Payer: Self-pay | Admitting: Cardiovascular Disease

## 2014-07-31 ENCOUNTER — Ambulatory Visit (INDEPENDENT_AMBULATORY_CARE_PROVIDER_SITE_OTHER): Payer: 59 | Admitting: Cardiovascular Disease

## 2014-07-31 VITALS — BP 162/84 | HR 75 | Ht 72.0 in | Wt 191.4 lb

## 2014-07-31 DIAGNOSIS — E108 Type 1 diabetes mellitus with unspecified complications: Secondary | ICD-10-CM

## 2014-07-31 DIAGNOSIS — E785 Hyperlipidemia, unspecified: Secondary | ICD-10-CM

## 2014-07-31 DIAGNOSIS — I2510 Atherosclerotic heart disease of native coronary artery without angina pectoris: Secondary | ICD-10-CM

## 2014-07-31 DIAGNOSIS — I255 Ischemic cardiomyopathy: Secondary | ICD-10-CM

## 2014-07-31 DIAGNOSIS — I5042 Chronic combined systolic (congestive) and diastolic (congestive) heart failure: Secondary | ICD-10-CM

## 2014-07-31 DIAGNOSIS — I160 Hypertensive urgency: Secondary | ICD-10-CM

## 2014-07-31 DIAGNOSIS — I10 Essential (primary) hypertension: Secondary | ICD-10-CM

## 2014-07-31 NOTE — Progress Notes (Signed)
Patient ID: David Lane, male   DOB: 1945/10/15, 68 y.o.   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 He continues to have fairly volatile blood pressure readings but for the most part his systolic blood pressure has been in the 656C and his diastolic blood pressure in the 70s or 80s. Today, paradoxically his blood pressure is rather high. There may be some association with variations in glucose control. His diabetes remains very brittle. He occasionally has symptoms of malabsorption related to pancreatic insufficiency.  He has a history of coronary disease and received a stent to the LAD artery and a stent to the posterior descending artery in 2013. He has moderately depressed left ventricular systolic function with an ejection fraction of 35-40% and has previously presented with pulmonary edema secondary to heart failure. A repeat echocardiogram on 08/01/13 showed an improved ejection fraction of 50-55% with mild concentric hypertrophy. He has very brittle diabetes mellitus related to pancreatic insufficiency following acute pancreatitis.    Allergies  Allergen Reactions  . Lipitor [Atorvastatin] Other (See Comments)    Muscle weakness    Current Outpatient Prescriptions  Medication Sig Dispense Refill  . ACCU-CHEK SOFTCLIX LANCETS lancets Use as instructed 100 each 12  . amLODipine (NORVASC) 5 MG tablet Take 5 mg by mouth at bedtime.     Marland Kitchen aspirin 81 MG tablet Take 81 mg by mouth daily.      . furosemide (LASIX) 40 MG tablet Take 40 mg by mouth daily.    Marland Kitchen glucose blood (ONE TOUCH ULTRA TEST) test strip 1 each by Other route 2 (two) times daily. And lancets 2/day 250.01 100 each 12  . glucose blood test strip Use as instructed 100  each 12  . LANTUS SOLOSTAR 100 UNIT/ML Solostar Pen Inject 40 Units into the skin daily.     Marland Kitchen lisinopril (PRINIVIL,ZESTRIL) 20 MG tablet   2  . loratadine (CLARITIN REDITABS) 10 MG dissolvable tablet Take 10 mg by mouth daily as needed for allergies.    . Multiple Vitamin (MULTIVITAMIN) capsule Take 1 capsule by mouth daily. HOLD UNTIL ABLE TO SWALLOW PILLS WHOLE    . nebivolol (BYSTOLIC) 5 MG tablet Take 3 tablets (15 mg total) by mouth daily. Take 3 tabs daily 90 tablet 6  . nitroGLYCERIN (NITROSTAT) 0.4 MG SL tablet Place 1 tablet (0.4 mg total) under the tongue every 5 (five) minutes as needed for chest pain. 25 tablet 2  . omeprazole (PRILOSEC) 20 MG capsule TAKE ONE CAPSULE TWICE A DAY 180 capsule 0  . Pancrelipase, Lip-Prot-Amyl, 36000 UNITS CPEP Take 2 capsules by mouth 3 (three) times daily with meals. HOLD UNTIL ABLE TO SWALLOW PILLS WHOLE    . simvastatin (ZOCOR) 40 MG tablet Take 1 tablet (40 mg total) by mouth at bedtime. HOLD UNTIL ABLE TO SWALLOW PILLS WHOLE 90 tablet 3  . spironolactone (ALDACTONE) 25 MG tablet Take 25 mg by mouth daily.    Marland Kitchen ZIRGAN 0.15 % GEL   0   No current facility-administered medications for this visit.    Past Medical History  Diagnosis Date  . Hyperlipemia   . Hypertension   . Pancreatitis 2000's  . Hepatic lesion 02/04/11  . GERD (gastroesophageal reflux disease)   . CAD (  coronary artery disease)     last cath by [facility] DR.  Lillianna Sabel showing  some  disease involving LCX and small size of Diag   . NSTEMI (non-ST elevated myocardial infarction) 11/21/2009  . Shortness of breath   . CHF exacerbation, due to diastolic dysfunction 51/70/0174  . Ischemic cardiomyopathy     EF 35-40%  . Liver hemangioma   . Type II diabetes mellitus     Past Surgical History  Procedure Laterality Date  . Incision and drainage abscess anal  1970's  . Cardiac catheterization  2011    minimal disease, medical management  . Coronary angioplasty with stent  placement  07/14/2012    successful PCI & stenting of mid LAD & PDA off the dominant CX    Family History  Problem Relation Age of Onset  . Diabetes Paternal Grandmother   . Diabetes Maternal Grandfather   . Colon cancer Neg Hx     History   Social History  . Marital Status: Single    Spouse Name: N/A    Number of Children: 0  . Years of Education: N/A   Occupational History  . [employer]   .  [employer]   Social History Main Topics  . Smoking status: Never Smoker   . Smokeless tobacco: Never Used  . Alcohol Use: No  . Drug Use: No  . Sexual Activity: No   Other Topics Concern  . Not on file   Social History Narrative    Review of systems: The patient specifically denies any chest pain at rest or with exertion, dyspnea at rest or with exertion, orthopnea, paroxysmal nocturnal dyspnea, syncope, palpitations, focal neurological deficits, intermittent claudication, lower extremity edema, unexplained weight gain, cough, hemoptysis or wheezing.  The patient also denies abdominal pain, nausea, vomiting, dysphagia, diarrhea, constipation, polyuria, polydipsia, dysuria, hematuria, frequency, urgency, abnormal bleeding or bruising, fever, chills, unexpected weight changes, mood swings, change in skin or hair texture, change in voice quality, auditory or visual problems, allergic reactions or rashes, new musculoskeletal complaints other than usual "aches and pains".   PHYSICAL EXAM BP 162/84 mmHg  Pulse 75  Ht 6' (1.829 m)  Wt 191 lb 6.4 oz (86.818 kg)  BMI 25.95 kg/m2 General: Alert, oriented x3, no distress  Head: no evidence of trauma, PERRL, EOMI, no exophtalmos or lid lag, no myxedema, no xanthelasma; normal ears, nose and oropharynx  Neck: normal jugular venous pulsations and no hepatojugular reflux; brisk carotid pulses without delay and no carotid bruits  Chest: clear to auscultation, no signs of consolidation by percussion or palpation, normal fremitus,  symmetrical and full respiratory excursions  Cardiovascular: normal position and quality of the apical impulse, regular rhythm, normal first and second heart sounds, no murmurs, rubs or gallops  Abdomen: no tenderness or distention, no masses by palpation, no abnormal pulsatility or arterial bruits, normal bowel sounds, no hepatosplenomegaly  Extremities: no clubbing, cyanosis or edema; 2+ radial, ulnar and brachial pulses bilaterally; 2+ right femoral, posterior tibial and dorsalis pedis pulses; 2+ left femoral, posterior tibial and dorsalis pedis pulses; no subclavian or femoral bruits  Neurological: grossly nonfocal  EKG: Normal sinus rhythm, left atrial abnormality, QS pattern in V1-V2 (old)  Lipid Panel     Component Value Date/Time   CHOL 66 03/01/2011 1602   TRIG 37.0 03/01/2011 1602   HDL 24.40* 03/01/2011 1602   CHOLHDL 3 03/01/2011 1602   VLDL 7.4 03/01/2011 1602   LDLCALC 34 03/01/2011 1602    BMET  Component Value Date/Time   NA 141 01/13/2014 0841   K 3.8 01/13/2014 0841   CL 104 01/13/2014 0841   CO2 19 01/13/2014 0841   GLUCOSE 233* 01/13/2014 0841   BUN 10 01/13/2014 0841   CREATININE 0.60 01/13/2014 0841   CREATININE 0.78 09/14/2013 0820   CALCIUM 8.5 01/13/2014 0841   GFRNONAA >90 01/13/2014 0841   GFRAA >90 01/13/2014 0841     ASSESSMENT AND PLAN Ischemic cardiomyopathy, EF 50% by echo 08/01/13  Appears clinically euvolemic, NYHA functional class I  Chronic combined systolic and diastolic CHF (congestive heart failure)  Continue same diuretic dose  CAD, 07/14/12- LAD/PDA DES  Angina free, despite sometimes intense physical activity. CCS class I  HYPERLIPIDEMIA  Excellent lipid profile despite volatile control of his diabetes  HYPERTENSION  Seems to feel much better using a longer acting agent such as Bystolic: smoother blood pressure control, fewer episodes of sudden dizziness compared with carvedilol. Continue current dose of  medication. If his blood pressure remains persistently elevated will increase to the 20 mg daily dose, which will be easier to take anyway. Patient Instructions  Dr. Sallyanne Kuster recommends that you schedule a follow-up appointment in: 2 months      Meds ordered this encounter  Medications  . lisinopril (PRINIVIL,ZESTRIL) 20 MG tablet    Sig:     Refill:  2  . ZIRGAN 0.15 % GEL    Sig:     Refill:  0    Aoife Bold  [name], MD, [facility] [phone] office [phone] pager

## 2014-07-31 NOTE — Patient Instructions (Signed)
Dr. Croitoru recommends that you schedule a follow-up appointment in: 2 months.   

## 2014-08-19 ENCOUNTER — Other Ambulatory Visit: Payer: Self-pay | Admitting: Endocrinology

## 2014-08-21 ENCOUNTER — Other Ambulatory Visit: Payer: Self-pay | Admitting: *Deleted

## 2014-08-21 MED ORDER — INSULIN PEN NEEDLE 31G X 5 MM MISC: Status: DC

## 2014-09-05 ENCOUNTER — Encounter (HOSPITAL_COMMUNITY): Payer: Self-pay | Admitting: Cardiovascular Disease

## 2014-09-19 ENCOUNTER — Other Ambulatory Visit: Payer: Self-pay | Admitting: Endocrinology

## 2014-10-01 ENCOUNTER — Other Ambulatory Visit: Payer: Self-pay | Admitting: *Deleted

## 2014-10-01 ENCOUNTER — Other Ambulatory Visit: Payer: Self-pay | Admitting: Cardiovascular Disease

## 2014-10-01 MED ORDER — AMLODIPINE BESYLATE 5 MG PO TABS: 5.0000 mg | ORAL_TABLET | Freq: Every day | ORAL | Status: DC

## 2014-10-05 ENCOUNTER — Other Ambulatory Visit: Payer: Self-pay | Admitting: Cardiovascular Disease

## 2014-10-07 NOTE — Telephone Encounter (Signed)
Rx refill sent to patient pharmacy   

## 2014-10-09 ENCOUNTER — Encounter: Payer: Self-pay | Admitting: Cardiovascular Disease

## 2014-10-09 ENCOUNTER — Ambulatory Visit (INDEPENDENT_AMBULATORY_CARE_PROVIDER_SITE_OTHER): Payer: 59 | Admitting: Cardiovascular Disease

## 2014-10-09 VITALS — BP 166/80 | HR 72 | Ht 72.0 in | Wt 193.1 lb

## 2014-10-09 DIAGNOSIS — I5042 Chronic combined systolic (congestive) and diastolic (congestive) heart failure: Secondary | ICD-10-CM

## 2014-10-09 DIAGNOSIS — I10 Essential (primary) hypertension: Secondary | ICD-10-CM

## 2014-10-09 DIAGNOSIS — I255 Ischemic cardiomyopathy: Secondary | ICD-10-CM

## 2014-10-09 MED ORDER — NEBIVOLOL HCL 20 MG PO TABS: 20.0000 mg | ORAL_TABLET | Freq: Every day | ORAL | Status: DC

## 2014-10-09 NOTE — Patient Instructions (Signed)
Increase Bystolic to 20mg  tablet - take 1/2 tablet twice a day.  An new Rx has been sent to your pharmacy.  Dr. Sallyanne Kuster recommends that you schedule a follow-up appointment in: 2 months.

## 2014-10-15 MED ORDER — NEBIVOLOL HCL 20 MG PO TABS: 20.0000 mg | ORAL_TABLET | Freq: Every day | ORAL | Status: DC

## 2014-10-15 NOTE — Progress Notes (Signed)
Patient ID: David Lane, male   DOB: September 08, 1946, 69 y.o.   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 He feels much better on Bystolic compared to other beta blockers, but his SBP is usually around 150. His diabetes remains very brittle. He occasionally has symptoms of malabsorption related to pancreatic insufficiency.  He has a history of coronary disease and received a stent to the LAD artery and a stent to the posterior descending artery in 2013. He has moderately depressed left ventricular systolic function with an ejection fraction of 35-40% and has previously presented with pulmonary edema secondary to heart failure. A repeat echocardiogram on 08/01/13 showed an improved ejection fraction of 50-55% with mild concentric hypertrophy. He has very brittle diabetes mellitus related to pancreatic insufficiency following acute pancreatitis.     Allergies  Allergen Reactions  . Lipitor [Atorvastatin] Other (See Comments)    Muscle weakness    Current Outpatient Prescriptions  Medication Sig Dispense Refill  . amLODipine (NORVASC) 5 MG tablet Take 1 tablet (5 mg total) by mouth at bedtime. 90 tablet 3  . aspirin 81 MG tablet Take 81 mg by mouth daily.      . furosemide (LASIX) 40 MG tablet TAKE 1 TABLET (40 MG TOTAL) BY MOUTH DAILY. 90 tablet 3  . glucose blood (ONE TOUCH ULTRA TEST) test strip 1 each by Other route 2 (two) times daily. And lancets 2/day 250.01 100 each 12  . Insulin Pen Needle 31G X 5 MM MISC Use to inject Lantus daily 30 each 0  . LANTUS SOLOSTAR 100 UNIT/ML Solostar Pen Inject 40 Units into the skin daily.     Marland Kitchen lisinopril (PRINIVIL,ZESTRIL) 20 MG tablet Take 20 mg by mouth 2 (two) times daily.   2  . Multiple Vitamin (MULTIVITAMIN) capsule Take 1 capsule by mouth daily. HOLD UNTIL ABLE TO SWALLOW  PILLS WHOLE    . Nebivolol HCl 20 MG TABS Take 1 tablet (20 mg total) by mouth daily. 90 tablet 3  . nitroGLYCERIN (NITROSTAT) 0.4 MG SL tablet Place 1 tablet (0.4 mg total) under the tongue every 5 (five) minutes as needed for chest pain. 25 tablet 2  . omeprazole (PRILOSEC) 20 MG capsule TAKE ONE CAPSULE TWICE A DAY 180 capsule 0  . Pancrelipase, Lip-Prot-Amyl, 36000 UNITS CPEP Take 2 capsules by mouth 3 (three) times daily with meals. HOLD UNTIL ABLE TO SWALLOW PILLS WHOLE    . simvastatin (ZOCOR) 40 MG tablet Take 1 tablet (40 mg total) by mouth at bedtime. HOLD UNTIL ABLE TO SWALLOW PILLS WHOLE 90 tablet 3  . loratadine (CLARITIN REDITABS) 10 MG dissolvable tablet Take 10 mg by mouth daily as needed for allergies.     No current facility-administered medications for this visit.    Past Medical History  Diagnosis Date  . Hyperlipemia   . Hypertension   . Pancreatitis 2000's  . Hepatic lesion 02/04/11  . GERD (gastroesophageal reflux disease)   . CAD (coronary artery disease)     last cath by [facility] DR.  Luverta Korte showing  some  disease involving LCX and small size of Diag   . NSTEMI (non-ST elevated myocardial infarction) 11/21/2009  . Shortness of breath   . CHF exacerbation, due to diastolic dysfunction 52/84/1324  . Ischemic cardiomyopathy     EF 35-40%  . Liver  hemangioma   . Type II diabetes mellitus     Past Surgical History  Procedure Laterality Date  . Incision and drainage abscess anal  1970's  . Cardiac catheterization  2011    minimal disease, medical management  . Coronary angioplasty with stent placement  07/14/2012    successful PCI & stenting of mid LAD & PDA off the dominant CX  . Left heart catheterization with coronary angiogram N/A 07/14/2012    Procedure: LEFT HEART CATHETERIZATION WITH CORONARY ANGIOGRAM;  Surgeon: [name], MD;  Location: [facility];  Service: Cardiovascular;  Laterality: N/A;  . Percutaneous coronary stent intervention (pci-s)  Right 07/14/2012    Procedure: PERCUTANEOUS CORONARY STENT INTERVENTION (PCI-S);  Surgeon: [name], MD;  Location: [facility] CATH LAB;  Service: Cardiovascular;  Laterality: Right;    Family History  Problem Relation Age of Onset  . Diabetes Paternal Grandmother   . Diabetes Maternal Grandfather   . Colon cancer Neg Hx     History   Social History  . Marital Status: Single    Spouse Name: N/A    Number of Children: 0  . Years of Education: N/A   Occupational History  . [employer]   .  [employer]   Social History Main Topics  . Smoking status: Never Smoker   . Smokeless tobacco: Never Used  . Alcohol Use: No  . Drug Use: No  . Sexual Activity: No   Other Topics Concern  . Not on file   Social History Narrative    Review of systems: The patient specifically denies any chest pain at rest or with exertion, dyspnea at rest or with exertion, orthopnea, paroxysmal nocturnal dyspnea, syncope, palpitations, focal neurological deficits, intermittent claudication, lower extremity edema, unexplained weight gain, cough, hemoptysis or wheezing.  The patient also denies abdominal pain, nausea, vomiting, dysphagia, diarrhea, constipation, polyuria, polydipsia, dysuria, hematuria, frequency, urgency, abnormal bleeding or bruising, fever, chills, unexpected weight changes, mood swings, change in skin or hair texture, change in voice quality, auditory or visual problems, allergic reactions or rashes, new musculoskeletal complaints other than usual "aches and pains".   PHYSICAL EXAM BP 166/80 mmHg  Pulse 72  Ht 6' (1.829 m)  Wt 193 lb 1.6 oz (87.59 kg)  BMI 26.18 kg/m2  General: Alert, oriented x3, no distress Head: no evidence of trauma, PERRL, EOMI, no exophtalmos or lid lag, no myxedema, no xanthelasma; normal ears, nose and oropharynx Neck: normal jugular venous pulsations and no hepatojugular reflux; brisk carotid pulses without delay and no carotid bruits Chest: clear to  auscultation, no signs of consolidation by percussion or palpation, normal fremitus, symmetrical and full respiratory excursions Cardiovascular: normal position and quality of the apical impulse, regular rhythm, normal first and second heart sounds, no murmurs, rubs or gallops Abdomen: no tenderness or distention, no masses by palpation, no abnormal pulsatility or arterial bruits, normal bowel sounds, no hepatosplenomegaly Extremities: no clubbing, cyanosis or edema; 2+ radial, ulnar and brachial pulses bilaterally; 2+ right femoral, posterior tibial and dorsalis pedis pulses; 2+ left femoral, posterior tibial and dorsalis pedis pulses; no subclavian or femoral bruits Neurological: grossly nonfocal  Lipid Panel     Component Value Date/Time   CHOL 66 03/01/2011 1602   TRIG 37.0 03/01/2011 1602   HDL 24.40* 03/01/2011 1602   CHOLHDL 3 03/01/2011 1602   VLDL 7.4 03/01/2011 1602   LDLCALC 34 03/01/2011 1602    BMET    Component Value Date/Time   NA 141 01/13/2014 0841  K 3.8 01/13/2014 0841   CL 104 01/13/2014 0841   CO2 19 01/13/2014 0841   GLUCOSE 233* 01/13/2014 0841   BUN 10 01/13/2014 0841   CREATININE 0.60 01/13/2014 0841   CREATININE 0.78 09/14/2013 0820   CALCIUM 8.5 01/13/2014 0841   GFRNONAA >90 01/13/2014 0841   GFRAA >90 01/13/2014 0841     ASSESSMENT AND PLAN  No symptoms or signs of hypervolemia/CHF or active coronary insufficiency. He generally feels well.  Increase nebivolol to 20 mg daily, taken as 10 mg BID as BP remains too high.  Meds ordered this encounter  Medications  .               .               . Nebivolol HCl 20 MG TABS    Sig: Take 1 tablet (20 mg total) by mouth daily.    Dispense:  90 tablet    Refill:  [place] Glendine Swetz, MD, [facility] [phone] office [phone] pager

## 2014-10-22 ENCOUNTER — Telehealth: Payer: Self-pay | Admitting: Family Medicine

## 2014-10-22 DIAGNOSIS — I10 Essential (primary) hypertension: Secondary | ICD-10-CM

## 2014-10-22 DIAGNOSIS — E119 Type 2 diabetes mellitus without complications: Secondary | ICD-10-CM

## 2014-10-22 DIAGNOSIS — E785 Hyperlipidemia, unspecified: Secondary | ICD-10-CM

## 2014-10-22 NOTE — Telephone Encounter (Signed)
Pt would like to go to [facility] for his cpe labs for cpe on 3/10.  Can you put the order in?

## 2014-10-24 NOTE — Telephone Encounter (Signed)
Labs ordered.

## 2014-11-11 ENCOUNTER — Other Ambulatory Visit: Payer: Self-pay | Admitting: Internal Medicine

## 2014-11-11 ENCOUNTER — Other Ambulatory Visit: Payer: Self-pay | Admitting: Endocrinology

## 2014-11-11 ENCOUNTER — Other Ambulatory Visit: Payer: Self-pay | Admitting: Cardiovascular Disease

## 2014-11-11 NOTE — Telephone Encounter (Signed)
Furosemide refilled #90 with 3 refills 10/07/14

## 2014-11-25 ENCOUNTER — Ambulatory Visit: Payer: 59 | Admitting: *Deleted

## 2014-11-25 ENCOUNTER — Other Ambulatory Visit: Payer: Self-pay

## 2014-11-25 ENCOUNTER — Encounter: Payer: Self-pay | Admitting: Endocrinology

## 2014-11-25 ENCOUNTER — Ambulatory Visit (INDEPENDENT_AMBULATORY_CARE_PROVIDER_SITE_OTHER): Payer: 59 | Admitting: Endocrinology

## 2014-11-25 VITALS — BP 142/90 | HR 80 | Ht 72.0 in | Wt 197.0 lb

## 2014-11-25 VITALS — BP 180/100

## 2014-11-25 DIAGNOSIS — E108 Type 1 diabetes mellitus with unspecified complications: Secondary | ICD-10-CM

## 2014-11-25 DIAGNOSIS — Z0189 Encounter for other specified special examinations: Secondary | ICD-10-CM

## 2014-11-25 DIAGNOSIS — Z0000 Encounter for general adult medical examination without abnormal findings: Secondary | ICD-10-CM | POA: Insufficient documentation

## 2014-11-25 DIAGNOSIS — Z125 Encounter for screening for malignant neoplasm of prostate: Secondary | ICD-10-CM

## 2014-11-25 LAB — URINALYSIS, ROUTINE W REFLEX MICROSCOPIC
Bilirubin Urine: NEGATIVE
Ketones, ur: NEGATIVE
Leukocytes, UA: NEGATIVE
NITRITE: NEGATIVE
SPECIFIC GRAVITY, URINE: 1.02 (ref 1.000–1.030)
Total Protein, Urine: NEGATIVE
UROBILINOGEN UA: 0.2 (ref 0.0–1.0)
Urine Glucose: >=1000 — AB
pH: 5.5 (ref 5.0–8.0)

## 2014-11-25 LAB — CBC WITH DIFFERENTIAL/PLATELET
BASOS ABS: 0 10*3/uL (ref 0.0–0.1)
Basophils Relative: 0.4 % (ref 0.0–3.0)
Eosinophils Absolute: 0.1 10*3/uL (ref 0.0–0.7)
Eosinophils Relative: 2.3 % (ref 0.0–5.0)
HCT: 38.2 % — ABNORMAL LOW (ref 39.0–52.0)
Hemoglobin: 12.9 g/dL — ABNORMAL LOW (ref 13.0–17.0)
LYMPHS ABS: 2.1 10*3/uL (ref 0.7–4.0)
LYMPHS PCT: 33.2 % (ref 12.0–46.0)
MCHC: 33.8 g/dL (ref 30.0–36.0)
MCV: 90.7 fl (ref 78.0–100.0)
Monocytes Absolute: 0.6 10*3/uL (ref 0.1–1.0)
Monocytes Relative: 9.9 % (ref 3.0–12.0)
Neutro Abs: 3.4 10*3/uL (ref 1.4–7.7)
Neutrophils Relative %: 54.2 % (ref 43.0–77.0)
PLATELETS: 247 10*3/uL (ref 150.0–400.0)
RBC: 4.21 Mil/uL — ABNORMAL LOW (ref 4.22–5.81)
RDW: 12.9 % (ref 11.5–15.5)
WBC: 6.3 10*3/uL (ref 4.0–10.5)

## 2014-11-25 LAB — BASIC METABOLIC PANEL
BUN: 21 mg/dL (ref 6–23)
CALCIUM: 9 mg/dL (ref 8.4–10.5)
CHLORIDE: 102 meq/L (ref 96–112)
CO2: 31 mEq/L (ref 19–32)
Creatinine, Ser: 0.92 mg/dL (ref 0.40–1.50)
GFR: 87.43 mL/min (ref >60.00)
Glucose, Bld: 278 mg/dL — ABNORMAL HIGH (ref 70–99)
POTASSIUM: 4.1 meq/L (ref 3.5–5.1)
SODIUM: 137 meq/L (ref 135–145)

## 2014-11-25 LAB — HEPATIC FUNCTION PANEL
ALT: 36 U/L (ref 0–53)
AST: 20 U/L (ref 0–37)
Albumin: 3.8 g/dL (ref 3.5–5.2)
Alkaline Phosphatase: 111 U/L (ref 39–117)
Bilirubin, Direct: 0.1 mg/dL (ref 0.0–0.3)
Total Bilirubin: 0.4 mg/dL (ref 0.2–1.2)
Total Protein: 6.7 g/dL (ref 6.0–8.3)

## 2014-11-25 LAB — LIPID PANEL
CHOL/HDL RATIO: 3
Cholesterol: 82 mg/dL (ref 0–200)
HDL: 29.6 mg/dL — ABNORMAL LOW (ref >39.00)
LDL CALC: 43 mg/dL (ref 0–99)
NonHDL: 52.4
TRIGLYCERIDES: 49 mg/dL (ref 0.0–149.0)
VLDL: 9.8 mg/dL (ref 0.0–40.0)

## 2014-11-25 LAB — MICROALBUMIN / CREATININE URINE RATIO
Creatinine,U: 53 mg/dL
MICROALB UR: 1.5 mg/dL (ref 0.0–1.9)
Microalb Creat Ratio: 2.8 mg/g (ref 0.0–30.0)

## 2014-11-25 LAB — TSH: TSH: 2.31 u[IU]/mL (ref 0.35–4.50)

## 2014-11-25 LAB — PSA: PSA: 0.65 ng/mL (ref 0.10–4.00)

## 2014-11-25 LAB — HEMOGLOBIN A1C: Hgb A1c MFr Bld: 10.4 % — ABNORMAL HIGH (ref 4.6–6.5)

## 2014-11-25 MED ORDER — INSULIN GLARGINE 100 UNIT/ML SOLOSTAR PEN: 45.0000 [IU] | PEN_INJECTOR | Freq: Every day | SUBCUTANEOUS | Status: DC

## 2014-11-25 MED ORDER — GLUCOSE BLOOD VI STRP: 1.0000 | ORAL_STRIP | Freq: Two times a day (BID) | Status: DC

## 2014-11-25 MED ORDER — AMLODIPINE BESYLATE 5 MG PO TABS: 10.0000 mg | ORAL_TABLET | Freq: Every day | ORAL | Status: DC

## 2014-11-25 MED ORDER — LISINOPRIL 20 MG PO TABS: 20.0000 mg | ORAL_TABLET | Freq: Two times a day (BID) | ORAL | Status: DC

## 2014-11-25 NOTE — Progress Notes (Signed)
Subjective:    Patient ID: David Lane, male    DOB: [birth year], 69 y.o.   MRN: [id number]  HPI Pt returns for f/u of diabetes mellitus: DM type: Insulin-requiring type 2 Dx'ed: 1988, during an episode of pancreatitis.  Complications: CAD Therapy: insulin since DKA: never Severe hypoglycemia: his last episode was in early may, 2014; he says this was probably due to taking more than the prescribed insulin dosage Pancreatitis: never Other: in December of 2014, he was changed to qd lantus, for further simplification of his insulin schedule.  V-GO pump was considered, but pt did not keep appt for it.   Interval history: pt says he never misses the insulin.  He takes 40 qam during the week, and 50 on weekends.  no cbg record, but states cbg's are consistently over 200.  There is no trend throughout the day or week.  He plans to retire soon.   Past Medical History  Diagnosis Date  . Hyperlipemia   . Hypertension   . Pancreatitis 2000's  . Hepatic lesion 02/04/11  . GERD (gastroesophageal reflux disease)   . CAD (coronary artery disease)     last cath by [facility] DR.  Mihai Croitoru showing  some  disease involving LCX and small size of Diag   . NSTEMI (non-ST elevated myocardial infarction) 11/21/2009  . Shortness of breath   . CHF exacerbation, due to diastolic dysfunction 35/57/3220  . Ischemic cardiomyopathy     EF 35-40%  . Liver hemangioma   . Type II diabetes mellitus     Past Surgical History  Procedure Laterality Date  . Incision and drainage abscess anal  1970's  . Cardiac catheterization  2011    minimal disease, medical management  . Coronary angioplasty with stent placement  07/14/2012    successful PCI & stenting of mid LAD & PDA off the dominant CX  . Left heart catheterization with coronary angiogram N/A 07/14/2012    Procedure: LEFT HEART CATHETERIZATION WITH CORONARY ANGIOGRAM;  Surgeon: [name], MD;  Location: [facility];  Service: Cardiovascular;   Laterality: N/A;  . Percutaneous coronary stent intervention (pci-s) Right 07/14/2012    Procedure: PERCUTANEOUS CORONARY STENT INTERVENTION (PCI-S);  Surgeon: [name], MD;  Location: [facility] CATH LAB;  Service: Cardiovascular;  Laterality: Right;    History   Social History  . Marital Status: Single    Spouse Name: N/A  . Number of Children: 0  . Years of Education: N/A   Occupational History  . [employer]   .  [employer]   Social History Main Topics  . Smoking status: Never Smoker   . Smokeless tobacco: Never Used  . Alcohol Use: No  . Drug Use: No  . Sexual Activity: No   Other Topics Concern  . Not on file   Social History Narrative    Current Outpatient Prescriptions on File Prior to Visit  Medication Sig Dispense Refill  . amLODipine (NORVASC) 5 MG tablet Take 2 tablets (10 mg total) by mouth daily. 180 tablet 1  . aspirin 81 MG tablet Take 81 mg by mouth daily.      . furosemide (LASIX) 40 MG tablet TAKE 1 TABLET (40 MG TOTAL) BY MOUTH DAILY. 90 tablet 3  . Insulin Pen Needle 31G X 5 MM MISC Use to inject Lantus daily 30 each 0  . lisinopril (PRINIVIL,ZESTRIL) 20 MG tablet Take 1 tablet (20 mg total) by mouth 2 (two) times daily. 180 tablet 1  .  loratadine (CLARITIN REDITABS) 10 MG dissolvable tablet Take 10 mg by mouth daily as needed for allergies.    . Multiple Vitamin (MULTIVITAMIN) capsule Take 1 capsule by mouth daily. HOLD UNTIL ABLE TO SWALLOW PILLS WHOLE    . Nebivolol HCl 20 MG TABS Take 1 tablet (20 mg total) by mouth daily. 90 tablet 3  . nitroGLYCERIN (NITROSTAT) 0.4 MG SL tablet Place 1 tablet (0.4 mg total) under the tongue every 5 (five) minutes as needed for chest pain. 25 tablet 2  . omeprazole (PRILOSEC) 20 MG capsule TAKE ONE CAPSULE TWICE A DAY 180 capsule 0  . Pancrelipase, Lip-Prot-Amyl, 36000 UNITS CPEP Take 2 capsules by mouth 3 (three) times daily with meals. HOLD UNTIL ABLE TO SWALLOW PILLS WHOLE    . simvastatin (ZOCOR) 40 MG  tablet Take 1 tablet (40 mg total) by mouth at bedtime. HOLD UNTIL ABLE TO SWALLOW PILLS WHOLE 90 tablet 3   No current facility-administered medications on file prior to visit.    Allergies  Allergen Reactions  . Lipitor [Atorvastatin] Other (See Comments)    Muscle weakness    Family History  Problem Relation Age of Onset  . Diabetes Paternal Grandmother   . Diabetes Maternal Grandfather   . Colon cancer Neg Hx     BP 142/90 mmHg  Pulse 80  Ht 6' (1.829 m)  Wt 197 lb (89.359 kg)  BMI 26.71 kg/m2  SpO2 98%    Review of Systems He denies hypoglycemia and weight change.      Objective:   Physical Exam VITAL SIGNS:  See vs page GENERAL: no distress Pulses: dorsalis pedis intact bilat.   MSK: no deformity of the feet.  CV: no leg edema.  Skin:  no ulcer on the feet.  normal color and temp on the feet. Neuro: sensation is intact to touch on the feet.   Ext: heavy calluses on the feet.     Lab Results  Component Value Date   HGBA1C 10.4* 11/25/2014      Assessment & Plan:  DM: worse.  Noncompliance with cbg recording and f/u ov's: persistent.  we discussed.  Pt says he'll improve.     Patient is advised the following: Patient Instructions  blood tests are being requested for you today.  We'll contact you with results. Please increase lantus to 45 units each morning, when you work.  On non-work days, take 55 units.   on this insulin schedule, it is not safe to miss or delay meals.  It is also not safe to take extra insulin.  Please take just these amounts, no matter what your blood sugar is.   check your blood sugar 2 times a day.  vary the time of day when you check, between before the 3 meals, and at bedtime.  also check if you have symptoms of your blood sugar being too high or too low.  please keep a record of the readings and bring it to your next appointment here.  please call us sooner if you are having low blood sugar episodes.   Please come back for a  follow-up appointment in 6 weeks.   When you retire, your insulin needs will probably change, so please let me know.

## 2014-11-25 NOTE — Patient Instructions (Addendum)
blood tests are being requested for you today.  We'll contact you with results. Please increase lantus to 45 units each morning, when you work.  On non-work days, take 55 units.   on this insulin schedule, it is not safe to miss or delay meals.  It is also not safe to take extra insulin.  Please take just these amounts, no matter what your blood sugar is.   check your blood sugar 2 times a day.  vary the time of day when you check, between before the 3 meals, and at bedtime.  also check if you have symptoms of your blood sugar being too high or too low.  please keep a record of the readings and bring it to your next appointment here.  please call us sooner if you are having low blood sugar episodes.   Please come back for a follow-up appointment in 6 weeks.   When you retire, your insulin needs will probably change, so please let me know.

## 2014-11-25 NOTE — Progress Notes (Signed)
Patient walked in with complaints of high blood pressure readings. He states he was previously lightheaded & dizzy at work which prompted his medications to be changed from carvedilol to bystolic. He reports he felt his BP was better controlled with carvedilol but was too dizzy. He denies SOB, diaphoresis. Patient complains that he feels his heart beat in his hands and that he can hear his heart beat when he lies down at night. He reports the whites of his eyes are red when he wakes up. He reports his voice changes when his BP is elevated & he doesn't feel good. He c/o a little pressure in chest, described as a tired feeling - example given by patient was after doing bicep curls with weights, how your muscles feel fatigued.   BP prior to meds list AM 183/99 After meds: 175/95 BP yesterday prior to meds 192/104 Home BP reading list showed readings consistently >170 SBP  He reports he has taken bystolic 20mg  BID for 7 days - instead of prescribed dose of bystolic 10mg  BID.   Patient expressed concerns over BP and that he knows the effects of high BPs on his body. He acknowledged that his feelings of anxiety or feeling worked up over his BPs can make his BP increase.   His complaints, symptoms, BP readings from home, BP readings from walk-in visit, and med list were reviewed by Dr. Sallyanne Kuster (DOD) Patient was advised to increase amlodipine to 10mg  daily (from 5mg ) and continue other current medications as prescribed.   Patient voiced understanding of plan. Patient has f/up on 3/18 with Dr. Sallyanne Kuster.

## 2014-11-25 NOTE — Patient Instructions (Addendum)
The doctor reviewed your medication list & BP readings.   The doctor advised that you INCREASE amlodipine to 10mg  DAILY Please continue your other medications as directed.   He advised that you go home and rest today   Please keep you follow up appointment with Dr. Sallyanne Kuster on MARCH 18th  Lisinopril & Amlodipine refilled 11/25/14 Furosemide & Bystolic have refills on file @ pharmacy

## 2014-11-28 ENCOUNTER — Other Ambulatory Visit: Payer: Self-pay | Admitting: Endocrinology

## 2014-12-05 ENCOUNTER — Ambulatory Visit (INDEPENDENT_AMBULATORY_CARE_PROVIDER_SITE_OTHER): Payer: 59 | Admitting: Family Medicine

## 2014-12-05 ENCOUNTER — Encounter: Payer: Self-pay | Admitting: Family Medicine

## 2014-12-05 VITALS — BP 162/82 | Temp 98.7°F | Ht 71.5 in | Wt 197.0 lb

## 2014-12-05 DIAGNOSIS — E108 Type 1 diabetes mellitus with unspecified complications: Secondary | ICD-10-CM | POA: Diagnosis not present

## 2014-12-05 DIAGNOSIS — E785 Hyperlipidemia, unspecified: Secondary | ICD-10-CM

## 2014-12-05 DIAGNOSIS — Z0000 Encounter for general adult medical examination without abnormal findings: Secondary | ICD-10-CM | POA: Diagnosis not present

## 2014-12-05 DIAGNOSIS — Z23 Encounter for immunization: Secondary | ICD-10-CM

## 2014-12-05 DIAGNOSIS — I160 Hypertensive urgency: Secondary | ICD-10-CM

## 2014-12-05 DIAGNOSIS — I10 Essential (primary) hypertension: Secondary | ICD-10-CM

## 2014-12-05 MED ORDER — AMLODIPINE BESYLATE 10 MG PO TABS: 10.0000 mg | ORAL_TABLET | Freq: Every day | ORAL | Status: DC

## 2014-12-05 MED ORDER — FUROSEMIDE 40 MG PO TABS: ORAL_TABLET | ORAL | Status: DC

## 2014-12-05 MED ORDER — SIMVASTATIN 40 MG PO TABS: 40.0000 mg | ORAL_TABLET | Freq: Every day | ORAL | Status: DC

## 2014-12-05 MED ORDER — LOSARTAN POTASSIUM 100 MG PO TABS: 100.0000 mg | ORAL_TABLET | Freq: Every day | ORAL | Status: DC

## 2014-12-05 NOTE — Progress Notes (Signed)
Pre visit review using our clinic review tool, if applicable. No additional management support is needed unless otherwise documented below in the visit note. 

## 2014-12-05 NOTE — Progress Notes (Signed)
   Subjective:    Patient ID: David Lane, male    DOB: 06-30-1946, 69 y.o.   MRN: [id number]  HPI David Lane is a 69 year old single  male nonsmoker who comes in today for general physical examination because of underlying diabetes type 1, hypertension, hyperlipidemia,  A recent A1c 10 days ago was 10.4%. They increased his insulin by a few units in the morning and if units in the afternoon. He is not happy with his numbers and would like a second opinion. I'll get him set up to see David Lane  Also his blood pressures 162/82. I explained to him with his blood sugar being up a scan of be difficult to get his blood pressure normal the to go hand in hand.  He gets routine eye care, dental care, colonoscopy and GI, vaccinations updated by David Lane  Cognitive function normal he is single and does not exercise on a regular basis home health safety reviewed no issues identified, no guns in the house, he does not have a healthcare power of attorney nor living well. Encouraged to do so  Recent cardiac evaluation September normal   Review of Systems  Constitutional: Negative.   HENT: Negative.   Eyes: Negative.   Respiratory: Negative.   Cardiovascular: Negative.   Gastrointestinal: Negative.   Endocrine: Negative.   Genitourinary: Negative.   Musculoskeletal: Negative.   Skin: Negative.   Allergic/Immunologic: Negative.   Neurological: Negative.   Hematological: Negative.   Psychiatric/Behavioral: Negative.        Objective:   Physical Exam  Constitutional: He is oriented to person, place, and time. He appears well-developed and well-nourished.  HENT:  Head: Normocephalic and atraumatic.  Right Ear: External ear normal.  Left Ear: External ear normal.  Nose: Nose normal.  Mouth/Throat: Oropharynx is clear and moist.  Eyes: Conjunctivae and EOM are normal. Pupils are equal, round, and reactive to light.  Neck: Normal range of motion. Neck supple. No JVD present. No tracheal  deviation present. No thyromegaly present.  Cardiovascular: Normal rate, regular rhythm, normal heart sounds and intact distal pulses.  Exam reveals no gallop and no friction rub.   No murmur heard. Pulmonary/Chest: Effort normal and breath sounds normal. No stridor. No respiratory distress. He has no wheezes. He has no rales. He exhibits no tenderness.  Abdominal: Soft. Bowel sounds are normal. He exhibits no distension and no mass. There is no tenderness. There is no rebound and no guarding.  Genitourinary: Rectum normal and penis normal. Guaiac negative stool. No penile tenderness.  1+ symmetrical nonnodular BPH  Musculoskeletal: Normal range of motion. He exhibits no edema or tenderness.  Amazingly no neuropathy  Lymphadenopathy:    He has no cervical adenopathy.  Neurological: He is alert and oriented to person, place, and time. He has normal reflexes. No cranial nerve deficit. He exhibits normal muscle tone.  Skin: Skin is warm and dry. No rash noted. No erythema. No pallor.  Psychiatric: He has a normal mood and affect. His behavior is normal. Judgment and thought content normal.  Nursing note and vitals reviewed.         Assessment & Plan:  Diabetes type 1 poor control........ second opinion from David Lane  Hypertension not at goal,,,,,,, stop lisinopril start Cozaar,,,,,, BP check daily follow-up in 6 weeks  Hyperlipidemia continue Zocor 40 mg daily and an aspirin tablet

## 2014-12-05 NOTE — Patient Instructions (Signed)
Norvasc 10 mg...Marland KitchenMarland KitchenMarland Kitchen 1 daily in the morning  Lasix 40 mg...Marland KitchenMarland KitchenMarland Kitchen 1 daily in the morning  Stop the lisinopril.................. start Cozaar 100 mg........Marland Kitchen 1 daily in the morning  Check your blood pressure daily in the morning  Return in 5 weeks to see me for follow-up.........Marland Kitchen bring a record of all your blood pressure readings with you  We will get you set up for a second opinion on your diabetes from Dr. Lorie Apley  In the meantime carbohydrate free diet........ walk 30 minutes daily

## 2014-12-06 ENCOUNTER — Telehealth: Payer: Self-pay | Admitting: Family Medicine

## 2014-12-06 NOTE — Telephone Encounter (Signed)
emmi mailed  °

## 2014-12-13 ENCOUNTER — Encounter: Payer: Self-pay | Admitting: Cardiovascular Disease

## 2014-12-13 ENCOUNTER — Ambulatory Visit (INDEPENDENT_AMBULATORY_CARE_PROVIDER_SITE_OTHER): Payer: 59 | Admitting: Cardiovascular Disease

## 2014-12-13 VITALS — BP 160/82 | HR 75 | Ht 72.0 in | Wt 200.6 lb

## 2014-12-13 DIAGNOSIS — Z79899 Other long term (current) drug therapy: Secondary | ICD-10-CM

## 2014-12-13 DIAGNOSIS — I2510 Atherosclerotic heart disease of native coronary artery without angina pectoris: Secondary | ICD-10-CM

## 2014-12-13 MED ORDER — SPIRONOLACTONE 25 MG PO TABS: 25.0000 mg | ORAL_TABLET | Freq: Every day | ORAL | Status: DC

## 2014-12-13 MED ORDER — LOSARTAN POTASSIUM 100 MG PO TABS: 100.0000 mg | ORAL_TABLET | Freq: Every day | ORAL | Status: DC

## 2014-12-13 NOTE — Patient Instructions (Signed)
START spironolactone 25mg  daily.  Your physician recommends that you return for lab work in: before your appointment with Dr. Sherren Mocha.  Dr. Sallyanne Kuster recommends that you schedule a follow-up appointment in: Next available.

## 2014-12-15 NOTE — Progress Notes (Signed)
Patient ID: David Lane, male   DOB: Aug 02, 1946, 69 y.o.   MRN: [id number]     Cardiology Office Note   Date:  12/15/2014   ID:  David Lane, Card 15-Aug-1946, MRN [id number]  PCP:  [name], MD  Cardiologist:   [name], MD   Chief Complaint  Patient presents with  . Follow-up    2 months:  states he doesn't feel good.  Recently saw Dr. Sherren Mocha who switched Lisinopril to Losartan 100mg  daily , but the meds he brought with him was Lisinopril and he is not sure if he is taking the correct meds.      History of Present Illness: David Lane is a 69 y.o. male who presents for follow up of severe HTN and history of CAD.  BP control remains a challenge and is causing Ray tremendous anxiety (as always). Typically his BP is around 160-170/80s. On one occasion he had severe HTN during a prolonged hypoglycemic event.  Dr. Sherren Mocha replaced lisinopril with losartan, but his BP remains high. BP control was better with carvedilol, but he had problems with dizziness and tolerates Bystolic better. He is taking a total of 40 mg of Bystolic daily.  He has a history of coronary disease and received a stent to the LAD artery and a stent to the posterior descending artery in 2013. He has moderately depressed left ventricular systolic function with an ejection fraction of 35-40% and has previously presented with pulmonary edema secondary to heart failure. A repeat echocardiogram on 08/01/13 showed an improved ejection fraction of 50-55% with mild concentric hypertrophy. He has very brittle diabetes mellitus related to pancreatic insufficiency following acute pancreatitis.   Past Medical History  Diagnosis Date  . Hyperlipemia   . Hypertension   . Pancreatitis 2000's  . Hepatic lesion 02/04/11  . GERD (gastroesophageal reflux disease)   . CAD (coronary artery disease)     last cath by [facility] DR.  Viral Schramm showing  some  disease involving LCX and small size of Diag   . NSTEMI  (non-ST elevated myocardial infarction) 11/21/2009  . Shortness of breath   . CHF exacerbation, due to diastolic dysfunction 61/95/0932  . Ischemic cardiomyopathy     EF 35-40%  . Liver hemangioma   . Type II diabetes mellitus     Past Surgical History  Procedure Laterality Date  . Incision and drainage abscess anal  1970's  . Cardiac catheterization  2011    minimal disease, medical management  . Coronary angioplasty with stent placement  07/14/2012    successful PCI & stenting of mid LAD & PDA off the dominant CX  . Left heart catheterization with coronary angiogram N/A 07/14/2012    Procedure: LEFT HEART CATHETERIZATION WITH CORONARY ANGIOGRAM;  Surgeon: [name], MD;  Location: [facility] CATH LAB;  Service: Cardiovascular;  Laterality: N/A;  . Percutaneous coronary stent intervention (pci-s) Right 07/14/2012    Procedure: PERCUTANEOUS CORONARY STENT INTERVENTION (PCI-S);  Surgeon: [name], MD;  Location: [facility] CATH LAB;  Service: Cardiovascular;  Laterality: Right;     Current Outpatient Prescriptions  Medication Sig Dispense Refill  . amLODipine (NORVASC) 10 MG tablet Take 1 tablet (10 mg total) by mouth daily. 90 tablet 3  . aspirin 81 MG tablet Take 81 mg by mouth daily.      . B-D UF III MINI PEN NEEDLES 31G X 5 MM MISC USE TO INJECT LANTUS DAILY 100 each 5  . furosemide (LASIX) 40 MG tablet TAKE 1  TABLET (40 MG TOTAL) BY MOUTH DAILY. 90 tablet 3  . glucose blood (ONE TOUCH ULTRA TEST) test strip 1 each by Other route 2 (two) times daily. And lancets 2/day 250.01 100 each 12  . Insulin Glargine (LANTUS SOLOSTAR) 100 UNIT/ML Solostar Pen Inject 45 Units into the skin daily. 55 units on non-work days. 30 mL 11  . losartan (COZAAR) 100 MG tablet Take 1 tablet (100 mg total) by mouth daily. 30 tablet 6  . Multiple Vitamin (MULTIVITAMIN) capsule Take 1 capsule by mouth daily. HOLD UNTIL ABLE TO SWALLOW PILLS WHOLE    . Nebivolol HCl 20 MG TABS Take 1 each by mouth 2 (two) times  daily.     . nitroGLYCERIN (NITROSTAT) 0.4 MG SL tablet Place 1 tablet (0.4 mg total) under the tongue every 5 (five) minutes as needed for chest pain. 25 tablet 2  . omeprazole (PRILOSEC) 20 MG capsule TAKE ONE CAPSULE TWICE A DAY 180 capsule 0  . Pancrelipase, Lip-Prot-Amyl, 36000 UNITS CPEP Take 2 capsules by mouth 3 (three) times daily with meals. HOLD UNTIL ABLE TO SWALLOW PILLS WHOLE    . simvastatin (ZOCOR) 40 MG tablet Take 1 tablet (40 mg total) by mouth at bedtime. HOLD UNTIL ABLE TO SWALLOW PILLS WHOLE 90 tablet 3  . spironolactone (ALDACTONE) 25 MG tablet Take 1 tablet (25 mg total) by mouth daily. 30 tablet 6   No current facility-administered medications for this visit.    Allergies:   Lipitor    Social History:  The patient  reports that he has never smoked. He has never used smokeless tobacco. He reports that he does not drink alcohol or use illicit drugs.   Family History:  The patient's family history includes Diabetes in his maternal grandfather and paternal grandmother. There is no history of Colon cancer.    ROS:  Please see the history of present illness.    Otherwise, review of systems positive for hypoglycemia related diaphoresis.   All other systems are reviewed and negative.    PHYSICAL EXAM: VS:  BP 160/82 mmHg  Pulse 75  Ht 6' (1.829 m)  Wt 200 lb 9.6 oz (90.992 kg)  BMI 27.20 kg/m2 , BMI Body mass index is 27.2 kg/(m^2).  General: Alert, oriented x3, no distress Head: no evidence of trauma, PERRL, EOMI, no exophtalmos or lid lag, no myxedema, no xanthelasma; normal ears, nose and oropharynx Neck: normal jugular venous pulsations and no hepatojugular reflux; brisk carotid pulses without delay and no carotid bruits Chest: clear to auscultation, no signs of consolidation by percussion or palpation, normal fremitus, symmetrical and full respiratory excursions Cardiovascular: normal position and quality of the apical impulse, regular rhythm, normal first and  second heart sounds, no murmurs, rubs or gallops Abdomen: no tenderness or distention, no masses by palpation, no abnormal pulsatility or arterial bruits, normal bowel sounds, no hepatosplenomegaly Extremities: no clubbing, cyanosis or edema; 2+ radial, ulnar and brachial pulses bilaterally; 2+ right femoral, posterior tibial and dorsalis pedis pulses; 2+ left femoral, posterior tibial and dorsalis pedis pulses; no subclavian or femoral bruits Neurological: grossly nonfocal Psych: euthymic mood, full affect   EKG:  EKG is ordered today. The ekg ordered today demonstrates NSR, mild first degree AV block, 216 ms, QTC 424 ms   Recent Labs: 01/12/2014: Magnesium 2.2 11/25/2014: ALT 36; BUN 21; Creatinine 0.92; Hemoglobin 12.9*; Platelets 247.0; Potassium 4.1; Sodium 137; TSH 2.31    Lipid Panel    Component Value Date/Time   CHOL 82 11/25/2014  1339   TRIG 49.0 11/25/2014 1339   HDL 29.60* 11/25/2014 1339   CHOLHDL 3 11/25/2014 1339   VLDL 9.8 11/25/2014 1339   LDLCALC 43 11/25/2014 1339      Wt Readings from Last 3 Encounters:  12/13/14 200 lb 9.6 oz (90.992 kg)  12/05/14 197 lb (89.359 kg)  11/25/14 197 lb (89.359 kg)      ASSESSMENT AND PLAN:  Ischemic cardiomyopathy, EF 50% by echo 08/01/13 Appears clinically euvolemic, NYHA functional class I  Chronic combined systolic and diastolic CHF (congestive heart failure) Continue same diuretic dose  CAD, 07/14/12- LAD/PDA DES Angina free, despite sometimes intense physical activity. CCS class I  HYPERLIPIDEMIA Excellent lipid profile despite volatile control of his diabetes  HYPERTENSION Ongoing saga to control his BP without troublesome side effects, complicated by issues with volatile glycemic control and anxiety, possible malabsorption.  Current medicines are reviewed at length with the patient today.  The patient has concerns regarding medicines. He wants his BP controlled all the time. I pointed out that we have achieved  that in the past, but he was intolerant due to dizziness. Will keep trying. His anxiety is unfortunately counterproductive.  The following changes have been made:  Start spironolactone. Warned that full effect will take at least 2 weeks. Recheck BMET on losartan + spironolactone   Labs/ tests ordered today include:  Orders Placed This Encounter  Procedures  . Basic metabolic panel  . EKG 12-Lead    Patient Instructions  START spironolactone 25mg  daily.  Your physician recommends that you return for lab work in: before your appointment with Dr. Sherren Mocha.  Dr. Sallyanne Kuster recommends that you schedule a follow-up appointment in: Next available.        Mikael Spray, MD  12/15/2014 7:45 PM    [name], MD, [facility] [phone] office [phone] pager \

## 2014-12-16 ENCOUNTER — Other Ambulatory Visit: Payer: Self-pay | Admitting: Cardiovascular Disease

## 2014-12-16 ENCOUNTER — Other Ambulatory Visit: Payer: Self-pay | Admitting: Internal Medicine

## 2014-12-17 NOTE — Telephone Encounter (Signed)
Rx refill denied to patient pharmacy

## 2015-01-06 ENCOUNTER — Ambulatory Visit (INDEPENDENT_AMBULATORY_CARE_PROVIDER_SITE_OTHER): Payer: 59 | Admitting: Endocrinology

## 2015-01-06 ENCOUNTER — Encounter: Payer: Self-pay | Admitting: Endocrinology

## 2015-01-06 VITALS — BP 140/84 | HR 73 | Temp 97.9°F | Ht 72.0 in | Wt 197.0 lb

## 2015-01-06 DIAGNOSIS — E108 Type 1 diabetes mellitus with unspecified complications: Secondary | ICD-10-CM

## 2015-01-06 MED ORDER — PANCRELIPASE (LIP-PROT-AMYL) 36000-114000 UNITS PO CPEP: 72000.0000 [IU] | ORAL_CAPSULE | Freq: Three times a day (TID) | ORAL | Status: DC

## 2015-01-06 MED ORDER — INSULIN GLARGINE 100 UNIT/ML SOLOSTAR PEN: 40.0000 [IU] | PEN_INJECTOR | Freq: Every day | SUBCUTANEOUS | Status: DC

## 2015-01-06 NOTE — Progress Notes (Signed)
Subjective:    Patient ID: David Lane, male    DOB: 09-07-1946, 69 y.o.   MRN: [id number]  HPI Pt returns for f/u of diabetes mellitus: DM type: Insulin-requiring type 2 Dx'ed: 1988, during an episode of pancreatitis.  Complications: CAD and polyneuropathy Therapy: insulin since 1990 DKA: never Severe hypoglycemia: last episode was in early may, 2014; he says this was probably due to taking more than the prescribed insulin dosage.  Other: in December of 2014, he was changed to qd lantus, for further simplification of his insulin schedule.  V-GO pump was considered, but pt did not keep appt for it.   Interval history: he brings a record of his cbg's which i have reviewed today.  It varies from 69-222.  There is no trend throughout the day.  It is higher on weekends.  Pt says he never misses the insulin.  pt states he feels well in general. Past Medical History  Diagnosis Date  . Hyperlipemia   . Hypertension   . Pancreatitis 2000's  . Hepatic lesion 02/04/11  . GERD (gastroesophageal reflux disease)   . CAD (coronary artery disease)     last cath by [facility] DR.  Mihai Croitoru showing  some  disease involving LCX and small size of Diag   . NSTEMI (non-ST elevated myocardial infarction) 11/21/2009  . Shortness of breath   . CHF exacerbation, due to diastolic dysfunction 50/05/3817  . Ischemic cardiomyopathy     EF 35-40%  . Liver hemangioma   . Type II diabetes mellitus     Past Surgical History  Procedure Laterality Date  . Incision and drainage abscess anal  1970's  . Cardiac catheterization  2011    minimal disease, medical management  . Coronary angioplasty with stent placement  07/14/2012    successful PCI & stenting of mid LAD & PDA off the dominant CX  . Left heart catheterization with coronary angiogram N/A 07/14/2012    Procedure: LEFT HEART CATHETERIZATION WITH CORONARY ANGIOGRAM;  Surgeon: [name], MD;  Location: [place] CATH LAB;  Service: Cardiovascular;   Laterality: N/A;  . Percutaneous coronary stent intervention (pci-s) Right 07/14/2012    Procedure: PERCUTANEOUS CORONARY STENT INTERVENTION (PCI-S);  Surgeon: [name], MD;  Location: [facility] [place] CATH LAB;  Service: Cardiovascular;  Laterality: Right;    History   Social History  . Marital Status: Single    Spouse Name: N/A  . Number of Children: 0  . Years of Education: N/A   Occupational History  . [employer]   .  [employer]   Social History Main Topics  . Smoking status: Never Smoker   . Smokeless tobacco: Never Used  . Alcohol Use: No  . Drug Use: No  . Sexual Activity: No   Other Topics Concern  . Not on file   Social History Narrative    Current Outpatient Prescriptions on File Prior to Visit  Medication Sig Dispense Refill  . amLODipine (NORVASC) 10 MG tablet Take 1 tablet (10 mg total) by mouth daily. 90 tablet 3  . aspirin 81 MG tablet Take 81 mg by mouth daily.      . B-D UF III MINI PEN NEEDLES 31G X 5 MM MISC USE TO INJECT LANTUS DAILY 100 each 5  . furosemide (LASIX) 40 MG tablet TAKE 1 TABLET (40 MG TOTAL) BY MOUTH DAILY. 90 tablet 3  . glucose blood (ONE TOUCH ULTRA TEST) test strip 1 each by Other route 2 (two) times daily. And lancets  2/day 250.01 100 each 12  . losartan (COZAAR) 100 MG tablet Take 1 tablet (100 mg total) by mouth daily. 30 tablet 6  . Multiple Vitamin (MULTIVITAMIN) capsule Take 1 capsule by mouth daily. HOLD UNTIL ABLE TO SWALLOW PILLS WHOLE    . Nebivolol HCl 20 MG TABS Take 1 each by mouth 2 (two) times daily.     . nitroGLYCERIN (NITROSTAT) 0.4 MG SL tablet Place 1 tablet (0.4 mg total) under the tongue every 5 (five) minutes as needed for chest pain. 25 tablet 2  . omeprazole (PRILOSEC) 20 MG capsule TAKE ONE CAPSULE TWICE A DAY 180 capsule 0  . simvastatin (ZOCOR) 40 MG tablet Take 1 tablet (40 mg total) by mouth at bedtime. HOLD UNTIL ABLE TO SWALLOW PILLS WHOLE 90 tablet 3  . spironolactone (ALDACTONE) 25 MG tablet Take 1  tablet (25 mg total) by mouth daily. 30 tablet 6   No current facility-administered medications on file prior to visit.    Allergies  Allergen Reactions  . Lipitor [Atorvastatin] Other (See Comments)    Muscle weakness    Family History  Problem Relation Age of Onset  . Diabetes Paternal Grandmother   . Diabetes Maternal Grandfather   . Colon cancer Neg Hx     BP 140/84 mmHg  Pulse 73  Temp(Src) 97.9 F (36.6 C) (Oral)  Ht 6' (1.829 m)  Wt 197 lb (89.359 kg)  BMI 26.71 kg/m2  SpO2 97%  Review of Systems He denies LOC and weight change    Objective:   Physical Exam VITAL SIGNS:  See vs page GENERAL: no distress Pulses: dorsalis pedis intact bilat.   MSK: no deformity of the feet CV: no leg edema Skin:  no ulcer on the feet.  normal color and temp on the feet. Neuro: sensation is intact to touch on the feet, but decreased from normal.        Assessment & Plan:  DM: The pattern of his cbg's indicates he needs some adjustment in his therapy. HTN: unchanged.  We'll follow.  Hypoglycemia: due to insulin: our goal is to eliminate, given h/o CAD.    Patient is advised the following: Patient Instructions  Please take lantus, 40 units each morning, when you work.  On non-work days, take 60 units.   on this insulin schedule, it is not safe to miss or delay meals.  It is also not safe to take extra insulin.  Please take just these amounts, no matter what your blood sugar is.   check your blood sugar 2 times a day.  vary the time of day when you check, between before the 3 meals, and at bedtime.  also check if you have symptoms of your blood sugar being too high or too low.  please keep a record of the readings and bring it to your next appointment here.  please call us sooner if you are having low blood sugar episodes.   Please come back for a follow-up appointment in 6 weeks.

## 2015-01-06 NOTE — Patient Instructions (Addendum)
Please take lantus, 40 units each morning, when you work.  On non-work days, take 60 units.   on this insulin schedule, it is not safe to miss or delay meals.  It is also not safe to take extra insulin.  Please take just these amounts, no matter what your blood sugar is.   check your blood sugar 2 times a day.  vary the time of day when you check, between before the 3 meals, and at bedtime.  also check if you have symptoms of your blood sugar being too high or too low.  please keep a record of the readings and bring it to your next appointment here.  please call us sooner if you are having low blood sugar episodes.   Please come back for a follow-up appointment in 6 weeks.

## 2015-01-09 ENCOUNTER — Ambulatory Visit: Payer: 59 | Admitting: Family Medicine

## 2015-01-16 ENCOUNTER — Ambulatory Visit (INDEPENDENT_AMBULATORY_CARE_PROVIDER_SITE_OTHER): Payer: 59 | Admitting: Family Medicine

## 2015-01-16 VITALS — BP 130/80 | Temp 98.3°F | Wt 196.0 lb

## 2015-01-16 DIAGNOSIS — E108 Type 1 diabetes mellitus with unspecified complications: Secondary | ICD-10-CM | POA: Diagnosis not present

## 2015-01-16 DIAGNOSIS — I10 Essential (primary) hypertension: Secondary | ICD-10-CM

## 2015-01-16 NOTE — Progress Notes (Signed)
   Subjective:    Patient ID: David Lane, male    DOB: [birth year], 69 y.o.   MRN: [id number]  HPI David Lane is a 69 year old male who comes in today for follow-up of hypertension  His blood pressure is 130/80 on Norvasc 10 mg, Lasix 40 mg, Cozaar 626 mg, by systolic 20 mg twice a day and Aldactone 25 mg daily.  He's seeing Dr. Ebony Hail for follow-up on his diabetes. His A1c a month ago was extremely high. 10.4. Dr. Ebony Hail is working with him to get his blood sugar normal   Review of Systems    review of systems otherwise negative Objective:   Physical Exam Well-developed well-nourished male no acute distress vital signs stable he is afebrile,,,,,,,, very loquacious       Assessment & Plan:  Hypertension ago continue current therapy  Diabetes type 1,,,,,,,, followed by endocrinology

## 2015-01-16 NOTE — Patient Instructions (Signed)
David Lane is our new adult Designer, jewellery who will be taking over for me. He will see you when you're due for your annual physical examination. Call 3 months prior to your physical to get an appointment to see him

## 2015-01-16 NOTE — Progress Notes (Signed)
Pre visit review using our clinic review tool, if applicable. No additional management support is needed unless otherwise documented below in the visit note. 

## 2015-01-23 ENCOUNTER — Telehealth: Payer: Self-pay | Admitting: Internal Medicine

## 2015-01-24 ENCOUNTER — Encounter: Payer: Self-pay | Admitting: Cardiovascular Disease

## 2015-01-24 ENCOUNTER — Ambulatory Visit (INDEPENDENT_AMBULATORY_CARE_PROVIDER_SITE_OTHER): Payer: 59 | Admitting: Cardiovascular Disease

## 2015-01-24 VITALS — BP 160/84 | HR 74 | Ht 72.0 in | Wt 198.2 lb

## 2015-01-24 DIAGNOSIS — I10 Essential (primary) hypertension: Secondary | ICD-10-CM | POA: Diagnosis not present

## 2015-01-24 DIAGNOSIS — I2510 Atherosclerotic heart disease of native coronary artery without angina pectoris: Secondary | ICD-10-CM

## 2015-01-24 DIAGNOSIS — Z79899 Other long term (current) drug therapy: Secondary | ICD-10-CM | POA: Diagnosis not present

## 2015-01-24 DIAGNOSIS — I255 Ischemic cardiomyopathy: Secondary | ICD-10-CM

## 2015-01-24 DIAGNOSIS — E785 Hyperlipidemia, unspecified: Secondary | ICD-10-CM

## 2015-01-24 DIAGNOSIS — E119 Type 2 diabetes mellitus without complications: Secondary | ICD-10-CM | POA: Diagnosis not present

## 2015-01-24 DIAGNOSIS — I5042 Chronic combined systolic (congestive) and diastolic (congestive) heart failure: Secondary | ICD-10-CM

## 2015-01-24 DIAGNOSIS — I160 Hypertensive urgency: Secondary | ICD-10-CM

## 2015-01-24 DIAGNOSIS — I5022 Chronic systolic (congestive) heart failure: Secondary | ICD-10-CM

## 2015-01-24 MED ORDER — SPIRONOLACTONE 50 MG PO TABS: 50.0000 mg | ORAL_TABLET | Freq: Every day | ORAL | Status: DC

## 2015-01-24 MED ORDER — PANCRELIPASE (LIP-PROT-AMYL) 36000-114000 UNITS PO CPEP: 72000.0000 [IU] | ORAL_CAPSULE | Freq: Three times a day (TID) | ORAL | Status: DC

## 2015-01-24 NOTE — Telephone Encounter (Signed)
Sent Rx for Creon 36000 units, #180, no refills to [facility] in [place]. Pt has appt with Dr. Olevia Perches on 03/07/15 at 3:30 pm and needs to keep appt for future refills.

## 2015-01-24 NOTE — Patient Instructions (Signed)
Dr Sallyanne Kuster has ordered a 24 hour blood pressure monitor. This will monitor your blood pressure for 24 hours. You will have to be scheduled for this monitor to be placed at the [facility] location. Scheduling is based on the availability of the blood pressure monitor.  Your physician has recommended making the following medication changes: STOP LOSARTAN INCREASE Spironolactone to 50 mg - you may take 2 tablets of your current prescription until it runs out. A new prescription with new instructions has been sent to your pharmacy.  Your physician recommends that you return for lab work in 2 weeks.  Dr Sallyanne Kuster recommends that you schedule a follow-up appointment in 8 weeks.

## 2015-01-24 NOTE — Progress Notes (Signed)
Patient ID: David Lane, male   DOB: 1945-09-29, 69 y.o.   MRN: [id number]     Cardiology Office Note   Date:  01/25/2015   ID:  David, Lane [birth year], MRN [id number]  PCP:  [name], MD  Cardiologist:   [name], MD   Chief Complaint  Patient presents with  . Follow-up    5 week, CAD      History of Present Illness: David Lane is a 69 y.o. male who presents for follow-up of ischemic cardiomyopathy, chronic combined systolic and diastolic heart failure and severe hypertension, difficult to control.  As always Mckinley is doing well from a symptomatic standpoint. He continues to work and has no difficulty with shortness of breath or angina pectoris. He is not troubled by dizziness. Systolic blood pressure is usually high, typically in the high 140s or 150s, rarely in normal range. His diastolic blood pressures typically around 80. He brings a very detailed log of his blood pressure, routinely measured in the early morning. If he checks his blood pressure later in the day his systolic blood pressure is much better in the 130s.  It seems has been taking both lisinopril 40 mg daily and losartan 100 mg daily, even though the intention was to replace one with the other. We went over every single one of his medicine bottles today. He has been having trouble with diarrhea every time he runs out of his pancreatic enzymes.  I have postulated, although it is hard to prove, that at times he does not fully observe his medications due to pancreatic enzyme deficiency.  He has a history of coronary disease and received a stent to the LAD artery and a stent to the posterior descending artery in 2013. He has moderately depressed left ventricular systolic function with an ejection fraction of 35-40% and has previously presented with pulmonary edema secondary to heart failure. A repeat echocardiogram on 08/01/13 showed an improved ejection fraction of 50-55% with mild  concentric hypertrophy. He has very brittle diabetes mellitus related to pancreatic insufficiency following acute gallstone pancreatitis.   Past Medical History  Diagnosis Date  . Hyperlipemia   . Hypertension   . Pancreatitis 2000's  . Hepatic lesion 02/04/11  . GERD (gastroesophageal reflux disease)   . CAD (coronary artery disease)     last cath by [facility] DR.  Kalik Hoare showing  some  disease involving LCX and small size of Diag   . NSTEMI (non-ST elevated myocardial infarction) 11/21/2009  . Shortness of breath   . CHF exacerbation, due to diastolic dysfunction 37/85/8850  . Ischemic cardiomyopathy     EF 35-40%  . Liver hemangioma   . Type II diabetes mellitus   . Chronic systolic CHF (congestive heart failure), NYHA class 1 07/17/2012    Past Surgical History  Procedure Laterality Date  . Incision and drainage abscess anal  1970's  . Cardiac catheterization  2011    minimal disease, medical management  . Coronary angioplasty with stent placement  07/14/2012    successful PCI & stenting of mid LAD & PDA off the dominant CX  . Left heart catheterization with coronary angiogram N/A 07/14/2012    Procedure: LEFT HEART CATHETERIZATION WITH CORONARY ANGIOGRAM;  Surgeon: [name], MD;  Location: [facility] CATH LAB;  Service: Cardiovascular;  Laterality: N/A;  . Percutaneous coronary stent intervention (pci-s) Right 07/14/2012    Procedure: PERCUTANEOUS CORONARY STENT INTERVENTION (PCI-S);  Surgeon: [name], MD;  Location: [facility] CATH LAB;  Service: Cardiovascular;  Laterality: Right;     Current Outpatient Prescriptions  Medication Sig Dispense Refill  . amLODipine (NORVASC) 10 MG tablet Take 1 tablet (10 mg total) by mouth daily. (Patient taking differently: Take 10 mg by mouth daily. Take 2 tablets by mouth daily) 90 tablet 3  . aspirin 81 MG tablet Take 81 mg by mouth daily.      . B-D UF III MINI PEN NEEDLES 31G X 5 MM MISC USE TO INJECT LANTUS DAILY 100 each 5  .  Esomeprazole Magnesium (NEXIUM PO) Take 1 capsule by mouth as needed (for indigestion).    . furosemide (LASIX) 40 MG tablet TAKE 1 TABLET (40 MG TOTAL) BY MOUTH DAILY. 90 tablet 3  . glucose blood (ONE TOUCH ULTRA TEST) test strip 1 each by Other route 2 (two) times daily. And lancets 2/day 250.01 100 each 12  . Insulin Glargine (LANTUS SOLOSTAR) 100 UNIT/ML Solostar Pen Inject 40 Units into the skin daily. 60 units on non-work days. 30 mL 11  . lipase/protease/amylase (CREON) 36000 UNITS CPEP capsule Take 2 capsules (72,000 Units total) by mouth 3 (three) times daily with meals. HOLD UNTIL ABLE TO SWALLOW PILLS WHOLE 180 capsule 0  . lisinopril (PRINIVIL,ZESTRIL) 20 MG tablet Take 20 mg by mouth 2 (two) times daily.    . Multiple Vitamin (MULTIVITAMIN) capsule Take 1 capsule by mouth daily. HOLD UNTIL ABLE TO SWALLOW PILLS WHOLE    . Nebivolol HCl 20 MG TABS Take 1 each by mouth 2 (two) times daily.     . nitroGLYCERIN (NITROSTAT) 0.4 MG SL tablet Place 1 tablet (0.4 mg total) under the tongue every 5 (five) minutes as needed for chest pain. 25 tablet 2  . omeprazole (PRILOSEC) 20 MG capsule TAKE ONE CAPSULE TWICE A DAY 180 capsule 0  . simvastatin (ZOCOR) 40 MG tablet Take 1 tablet (40 mg total) by mouth at bedtime. HOLD UNTIL ABLE TO SWALLOW PILLS WHOLE 90 tablet 3  . spironolactone (ALDACTONE) 50 MG tablet Take 1 tablet (50 mg total) by mouth daily. 30 tablet 11   No current facility-administered medications for this visit.    Allergies:   Lipitor    Social History:  The patient  reports that he has never smoked. He has never used smokeless tobacco. He reports that he does not drink alcohol or use illicit drugs.   Family History:  The patient's family history includes Diabetes in his maternal grandfather and paternal grandmother. There is no history of Colon cancer.    ROS:  Please see the history of present illness.    Otherwise, review of systems positive for none.   All other  systems are reviewed and negative.    PHYSICAL EXAM: VS:  BP 160/84 mmHg  Pulse 74  Ht 6' (1.829 m)  Wt 198 lb 3.2 oz (89.903 kg)  BMI 26.87 kg/m2 , BMI Body mass index is 26.87 kg/(m^2).  General: Alert, oriented x3, no distress Head: no evidence of trauma, PERRL, EOMI, no exophtalmos or lid lag, no myxedema, no xanthelasma; normal ears, nose and oropharynx Neck: normal jugular venous pulsations and no hepatojugular reflux; brisk carotid pulses without delay and no carotid bruits Chest: clear to auscultation, no signs of consolidation by percussion or palpation, normal fremitus, symmetrical and full respiratory excursions Cardiovascular: normal position and quality of the apical impulse, regular rhythm, normal first and second heart sounds, no murmurs, rubs or gallops Abdomen: no tenderness or distention, no masses by palpation, no abnormal pulsatility  or arterial bruits, normal bowel sounds, no hepatosplenomegaly Extremities: no clubbing, cyanosis or edema; 2+ radial, ulnar and brachial pulses bilaterally; 2+ right femoral, posterior tibial and dorsalis pedis pulses; 2+ left femoral, posterior tibial and dorsalis pedis pulses; no subclavian or femoral bruits Neurological: grossly nonfocal Psych: euthymic mood, full affect   EKG:  EKG is not ordered today.   Recent Labs: 11/25/2014: ALT 36; BUN 21; Creatinine 0.92; Hemoglobin 12.9*; Platelets 247.0; Potassium 4.1; Sodium 137; TSH 2.31    Lipid Panel    Component Value Date/Time   CHOL 82 11/25/2014 1339   TRIG 49.0 11/25/2014 1339   HDL 29.60* 11/25/2014 1339   CHOLHDL 3 11/25/2014 1339   VLDL 9.8 11/25/2014 1339   LDLCALC 43 11/25/2014 1339      Wt Readings from Last 3 Encounters:  01/24/15 198 lb 3.2 oz (89.903 kg)  01/16/15 196 lb (88.905 kg)  01/06/15 197 lb (89.359 kg)      ASSESSMENT AND PLAN:  My suspicion is that Ray actually has pretty good blood pressure throughout most of the day but that in the early  morning, when some of his medications are wearing off, his systolic blood pressure is high. He clearly also has a component of situational/white coat hypertension and is a very anxious individual.  In the setting of brittle diabetes I don't think it's a good idea for him to take lisinopril, losartan and spironolactone. Have recommended that he discontinue the losartan which seem to have the least benefit for his blood pressure and likely has the least effective 24-hour coverage. He will increase the spironolactone to 50 mg daily. I've also recommended he wear a 24-hour blood pressure monitor. Will make further adjustments of his medications based on those results.  Currently he has no symptoms of acute coronary insufficiency or acute heart failure. He appears to be clinically euvolemic on a relatively low dose of loop diuretic.   Current medicines are reviewed at length with the patient today.  The patient does not have concerns regarding medicines.  The following changes have been made:  Stop losartan, increased spironolactone to 50 mg daily  Labs/ tests ordered today include:  Orders Placed This Encounter  Procedures  . Basic metabolic panel  . AMB BP MONITORING   Patient Instructions  Dr Sallyanne Kuster has ordered a 24 hour blood pressure monitor. This will monitor your blood pressure for 24 hours. You will have to be scheduled for this monitor to be placed at the [facility] location. Scheduling is based on the availability of the blood pressure monitor.  Your physician has recommended making the following medication changes: STOP LOSARTAN INCREASE Spironolactone to 50 mg - you may take 2 tablets of your current prescription until it runs out. A new prescription with new instructions has been sent to your pharmacy.  Your physician recommends that you return for lab work in 2 weeks.  Dr Sallyanne Kuster recommends that you schedule a follow-up appointment in 8 weeks.    Mikael Spray, MD    01/25/2015 8:56 PM    [name], MD, [facility] [phone] office [phone] pager

## 2015-01-25 ENCOUNTER — Encounter: Payer: Self-pay | Admitting: Cardiovascular Disease

## 2015-02-17 ENCOUNTER — Encounter: Payer: Self-pay | Admitting: Endocrinology

## 2015-02-17 ENCOUNTER — Ambulatory Visit (INDEPENDENT_AMBULATORY_CARE_PROVIDER_SITE_OTHER): Payer: 59 | Admitting: Endocrinology

## 2015-02-17 VITALS — BP 128/86 | HR 93 | Temp 98.1°F | Ht 72.0 in | Wt 195.0 lb

## 2015-02-17 DIAGNOSIS — E108 Type 1 diabetes mellitus with unspecified complications: Secondary | ICD-10-CM

## 2015-02-17 LAB — HEMOGLOBIN A1C: Hgb A1c MFr Bld: 8.5 % — ABNORMAL HIGH (ref 4.6–6.5)

## 2015-02-17 NOTE — Patient Instructions (Addendum)
Please continue lantus, 40 units each morning, when you work.  On non-work days, continue 60 units.   on this insulin schedule, it is not safe to miss or delay meals.  It is also not safe to take extra insulin.  Please take just these amounts, no matter what your blood sugar is.   check your blood sugar 2 times a day.  vary the time of day when you check, between before the 3 meals, and at bedtime.  also check if you have symptoms of your blood sugar being too high or too low.  please keep a record of the readings and bring it to your next appointment here.  Also, please write on it why you think it is higher or lower--this is the next step.  please call us sooner if you are having low blood sugar episodes.   blood tests are requested for you today.  We'll let you know about the results. Please come back for a follow-up appointment in 6 weeks.

## 2015-02-17 NOTE — Progress Notes (Signed)
Subjective:    Patient ID: David Lane, male    DOB: Feb 29, 1946, 69 y.o.   MRN: [id number]  HPI Pt returns for f/u of diabetes mellitus: DM type: Insulin-requiring type 2 Dx'ed: 1988, during an episode of pancreatitis.  Complications: CAD and polyneuropathy Therapy: insulin since 1990 DKA: never Severe hypoglycemia: last episode was in early may, 2014; he says this was probably due to taking more than the prescribed insulin dosage.  Other: in December of 2014, he was changed to qd lantus, for further simplification of his insulin schedule.  He needs less insulin on work days.  V-GO pump was considered, but pt did not keep appt for it.   Interval history: he brings a scant ecord of his cbg's which i have reviewed today.  It varies from 68-404 (after he missed a dose--which he seldom does).  It is the same on weekends as weekdays.  pt states he feels well in general.  He takes lantus, 45 units each morning, when you work; on non-work days, he takes 60 units.    Past Medical History  Diagnosis Date  . Hyperlipemia   . Hypertension   . Pancreatitis 2000's  . Hepatic lesion 02/04/11  . GERD (gastroesophageal reflux disease)   . CAD (coronary artery disease)     last cath by [facility] DR.  Mihai Croitoru showing  some  disease involving LCX and small size of Diag   . NSTEMI (non-ST elevated myocardial infarction) 11/21/2009  . Shortness of breath   . CHF exacerbation, due to diastolic dysfunction 44/09/270  . Ischemic cardiomyopathy     EF 35-40%  . Liver hemangioma   . Type II diabetes mellitus   . Chronic systolic CHF (congestive heart failure), NYHA class 1 07/17/2012    Past Surgical History  Procedure Laterality Date  . Incision and drainage abscess anal  1970's  . Cardiac catheterization  2011    minimal disease, medical management  . Coronary angioplasty with stent placement  07/14/2012    successful PCI & stenting of mid LAD & PDA off the dominant CX  . Left heart  catheterization with coronary angiogram N/A 07/14/2012    Procedure: LEFT HEART CATHETERIZATION WITH CORONARY ANGIOGRAM;  Surgeon: [name], MD;  Location: [facility] CATH LAB;  Service: Cardiovascular;  Laterality: N/A;  . Percutaneous coronary stent intervention (pci-s) Right 07/14/2012    Procedure: PERCUTANEOUS CORONARY STENT INTERVENTION (PCI-S);  Surgeon: [name], MD;  Location: [facility] CATH LAB;  Service: Cardiovascular;  Laterality: Right;    History   Social History  . Marital Status: Single    Spouse Name: N/A  . Number of Children: 0  . Years of Education: N/A   Occupational History  . [employer]   .  [employer]   Social History Main Topics  . Smoking status: Never Smoker   . Smokeless tobacco: Never Used  . Alcohol Use: No  . Drug Use: No  . Sexual Activity: No   Other Topics Concern  . Not on file   Social History Narrative    Current Outpatient Prescriptions on File Prior to Visit  Medication Sig Dispense Refill  . amLODipine (NORVASC) 10 MG tablet Take 1 tablet (10 mg total) by mouth daily. (Patient taking differently: Take 10 mg by mouth daily. Take 2 tablets by mouth daily) 90 tablet 3  . aspirin 81 MG tablet Take 81 mg by mouth daily.      . B-D UF III MINI PEN NEEDLES 31G  X 5 MM MISC USE TO INJECT LANTUS DAILY 100 each 5  . Esomeprazole Magnesium (NEXIUM PO) Take 1 capsule by mouth as needed (for indigestion).    . furosemide (LASIX) 40 MG tablet TAKE 1 TABLET (40 MG TOTAL) BY MOUTH DAILY. 90 tablet 3  . glucose blood (ONE TOUCH ULTRA TEST) test strip 1 each by Other route 2 (two) times daily. And lancets 2/day 250.01 100 each 12  . Insulin Glargine (LANTUS SOLOSTAR) 100 UNIT/ML Solostar Pen Inject 40 Units into the skin daily. 60 units on non-work days. 30 mL 11  . lipase/protease/amylase (CREON) 36000 UNITS CPEP capsule Take 2 capsules (72,000 Units total) by mouth 3 (three) times daily with meals. HOLD UNTIL ABLE TO SWALLOW PILLS WHOLE 180 capsule 0   . lisinopril (PRINIVIL,ZESTRIL) 20 MG tablet Take 20 mg by mouth 2 (two) times daily.    . Multiple Vitamin (MULTIVITAMIN) capsule Take 1 capsule by mouth daily. HOLD UNTIL ABLE TO SWALLOW PILLS WHOLE    . Nebivolol HCl 20 MG TABS Take 1 each by mouth 2 (two) times daily.     . nitroGLYCERIN (NITROSTAT) 0.4 MG SL tablet Place 1 tablet (0.4 mg total) under the tongue every 5 (five) minutes as needed for chest pain. 25 tablet 2  . omeprazole (PRILOSEC) 20 MG capsule TAKE ONE CAPSULE TWICE A DAY 180 capsule 0  . simvastatin (ZOCOR) 40 MG tablet Take 1 tablet (40 mg total) by mouth at bedtime. HOLD UNTIL ABLE TO SWALLOW PILLS WHOLE 90 tablet 3  . spironolactone (ALDACTONE) 50 MG tablet Take 1 tablet (50 mg total) by mouth daily. 30 tablet 11   No current facility-administered medications on file prior to visit.    Allergies  Allergen Reactions  . Lipitor [Atorvastatin] Other (See Comments)    Muscle weakness    Family History  Problem Relation Age of Onset  . Diabetes Paternal Grandmother   . Diabetes Maternal Grandfather   . Colon cancer Neg Hx     BP 128/86 mmHg  Pulse 93  Temp(Src) 98.1 F (36.7 C) (Oral)  Ht 6' (1.829 m)  Wt 195 lb (88.451 kg)  BMI 26.44 kg/m2  SpO2 97%    Review of Systems Denies LOC and weight change.      Objective:   Physical Exam VITAL SIGNS:  See vs page GENERAL: no distress Pulses: dorsalis pedis intact bilat.   MSK: no deformity of the feet CV: no leg edema Skin:  no ulcer on the feet.  normal color and temp on the feet. Neuro: sensation is intact to touch on the feet.    Lab Results  Component Value Date   HGBA1C 8.5* 02/17/2015      Assessment & Plan:  DM: glycemic control is improved  Patient is advised the following: Patient Instructions  Please continue lantus, 40 units each morning, when you work.  On non-work days, continue 60 units.   on this insulin schedule, it is not safe to miss or delay meals.  It is also not safe to  take extra insulin.  Please take just these amounts, no matter what your blood sugar is.   check your blood sugar 2 times a day.  vary the time of day when you check, between before the 3 meals, and at bedtime.  also check if you have symptoms of your blood sugar being too high or too low.  please keep a record of the readings and bring it to your next appointment here.  Also, please write on it why you think it is higher or lower--this is the next step.  please call us sooner if you are having low blood sugar episodes.   blood tests are requested for you today.  We'll let you know about the results. Please come back for a follow-up appointment in 6 weeks.     addendum: pt advised: the next steps are to write on cbg record why you think it is low or high, and to try to never miss the insulin

## 2015-02-20 ENCOUNTER — Telehealth: Payer: Self-pay | Admitting: Cardiovascular Disease

## 2015-02-20 NOTE — Telephone Encounter (Signed)
Returned call to patient.  He was at work, working on a project this morning, as he states, he "maybe got a little bit stressed out" working on it, felt "funny".  Went to breakroom, pt checked his BP, noted 192/100.  He rested. BP came down a few minutes later to 182/89. He notes that other than feeling "a little flushed" he was not having any symptoms after resting. Denies symptoms now. He thinks anxiety/stress caused the BP spike. Advised if CP w/ this, use nitro on-hand - this would also lower BP, but only temporarily. Advised if no active symptoms, continue to monitor BP - check twice a day. If feeling worse, esp dizzy, sweaty, nauseated, etc - ER is option. He states understanding. He does have a home BP cuff. Will keep a log of BP.  Advised also that we can bring in for a BP check here, will defer to his primary physician for any additional advice. Pt voiced agreement w/ plan.

## 2015-02-20 NOTE — Telephone Encounter (Signed)
Please call,pt is at work. He was eating lunch and felt funny. He took his blood pressure it was 192/100 and pulse rate was 69. Rested for 15 minutes and it went down to 186/89 and pulse 70. Face is flushed and red,no chest pains,and no shortness of breath.

## 2015-02-28 ENCOUNTER — Other Ambulatory Visit: Payer: Self-pay | Admitting: Cardiovascular Disease

## 2015-03-03 ENCOUNTER — Other Ambulatory Visit: Payer: Self-pay | Admitting: *Deleted

## 2015-03-03 MED ORDER — NEBIVOLOL HCL 20 MG PO TABS: 1.0000 | ORAL_TABLET | Freq: Two times a day (BID) | ORAL | Status: DC

## 2015-03-04 ENCOUNTER — Other Ambulatory Visit: Payer: Self-pay | Admitting: *Deleted

## 2015-03-05 ENCOUNTER — Other Ambulatory Visit: Payer: Self-pay | Admitting: Internal Medicine

## 2015-03-07 ENCOUNTER — Ambulatory Visit (INDEPENDENT_AMBULATORY_CARE_PROVIDER_SITE_OTHER): Payer: 59 | Admitting: Internal Medicine

## 2015-03-07 ENCOUNTER — Encounter: Payer: Self-pay | Admitting: Internal Medicine

## 2015-03-07 VITALS — BP 140/67 | HR 77 | Ht 72.0 in | Wt 198.5 lb

## 2015-03-07 DIAGNOSIS — K868 Other specified diseases of pancreas: Secondary | ICD-10-CM

## 2015-03-07 DIAGNOSIS — K861 Other chronic pancreatitis: Secondary | ICD-10-CM

## 2015-03-07 DIAGNOSIS — K8681 Exocrine pancreatic insufficiency: Secondary | ICD-10-CM

## 2015-03-07 DIAGNOSIS — K904 Malabsorption due to intolerance, not elsewhere classified: Secondary | ICD-10-CM

## 2015-03-07 DIAGNOSIS — K909 Intestinal malabsorption, unspecified: Secondary | ICD-10-CM

## 2015-03-07 MED ORDER — PANCRELIPASE (LIP-PROT-AMYL) 36000-114000 UNITS PO CPEP: 108000.0000 [IU] | ORAL_CAPSULE | Freq: Three times a day (TID) | ORAL | Status: DC

## 2015-03-07 NOTE — Progress Notes (Signed)
David Lane 03-18-1946 [id number]  Note: This dictation was prepared with Dragon digital system. Any transcriptional errors that result from this procedure are unintentional.   History of Present Illness: This is a 69 year old white male with chronic pancreatic insufficiency following prolonged hospitalization for  necrotizing pancreatitis in 42s  ( Dr Watt Climes).. He had a pancreatic pseudocyst . Last ultrasound  4 yeats ago. He has diabetes. Most recent upper abdominal ultrasound 2012 showed poor visualization of the pancreas. Gallbladder appeared normal. Upper endoscopy and colonoscopy 2012 revealed question of esophageal varices and normal colon. He is on total 72,000 units of Creon with each meal and 36,000 with each snack. Weight gradually increasing and he feels well most of the time but still has  Greasy loose stool depending on his diet.He would like [place] to do something abut it.    Past Medical History  Diagnosis Date  . Hyperlipemia   . Hypertension   . Pancreatitis 2000's  . Hepatic lesion 02/04/11  . GERD (gastroesophageal reflux disease)   . CAD (coronary artery disease)     last cath by [facility] DR.  Mihai Croitoru showing  some  disease involving LCX and small size of Diag   . NSTEMI (non-ST elevated myocardial infarction) 11/21/2009  . Shortness of breath   . CHF exacerbation, due to diastolic dysfunction 93/57/0177  . Ischemic cardiomyopathy     EF 35-40%  . Liver hemangioma   . Type II diabetes mellitus   . Chronic systolic CHF (congestive heart failure), NYHA class 1 07/17/2012    Past Surgical History  Procedure Laterality Date  . Incision and drainage abscess anal  1970's  . Cardiac catheterization  2011    minimal disease, medical management  . Coronary angioplasty with stent placement  07/14/2012    successful PCI & stenting of mid LAD & PDA off the dominant CX  . Left heart catheterization with coronary angiogram N/A 07/14/2012    Procedure: LEFT HEART  CATHETERIZATION WITH CORONARY ANGIOGRAM;  Surgeon: [name], MD;  Location: [facility] CATH LAB;  Service: Cardiovascular;  Laterality: N/A;  . Percutaneous coronary stent intervention (pci-s) Right 07/14/2012    Procedure: PERCUTANEOUS CORONARY STENT INTERVENTION (PCI-S);  Surgeon: [name], MD;  Location: [facility] CATH LAB;  Service: Cardiovascular;  Laterality: Right;    Allergies  Allergen Reactions  . Lipitor [Atorvastatin] Other (See Comments)    Muscle weakness    Family history and social history have been reviewed.  Review of Systems: Denies abdominal pain rectal bleeding. Positive for occasional diarrhea  The remainder of the 10 point ROS is negative except as outlined in the H&P  Physical Exam: General Appearance Well developed, in no distress Eyes  Non icteric  HEENT  Non traumatic, normocephalic  Mouth No lesion, tongue papillated, no cheilosis Neck Supple without adenopathy, thyroid not enlarged, no carotid bruits, no JVD Lungs Clear to auscultation bilaterally COR Normal S1, normal S2, regular rhythm, no murmur, quiet precordium Abdomen soft nontender with normoactive bowel sounds. No abnormal ruushes. No tenderness. Liver edge at costal margin Rectal yellow pasty Hemoccult-negative stool Extremities  No pedal edema Skin No lesions Neurological Alert and oriented x 3 Psychological Normal mood and affect  Assessment and Plan:   68 year old white male with the exocrine pancreatic insufficiency as well as diabetes. He is on a high-dose of pancreatic enzyme replacement reporting break  through symptoms of  steatorrhea. We will increase  Creon to 3 tablets with each meal to total 108,000 units 3 times  a day +36,000 units with snacks. We will also order upper abdominal ultrasound to rule out any new pancreatic pseudocyst and also look at the gallbladder. He is to continue on his CM on when necessary basis, he is some planning to switch to [facility] prescription program  where he could be getting his pancreatic enzymes for free. He will let us know when it happens    Delfin Edis 03/07/2015

## 2015-03-07 NOTE — Patient Instructions (Addendum)
We will call you with your Ultrasound appointment. We have sent  prescriptions to your pharmacy.   Dr Sherren Mocha, Dr Sallyanne Kuster

## 2015-03-10 ENCOUNTER — Telehealth: Payer: Self-pay

## 2015-03-10 MED ORDER — PANCRELIPASE (LIP-PROT-AMYL) 36000-114000 UNITS PO CPEP: 108000.0000 [IU] | ORAL_CAPSULE | Freq: Three times a day (TID) | ORAL | Status: DC

## 2015-03-10 NOTE — Telephone Encounter (Signed)
I have printed and mailed a prescription claim form to the pt and he will send a copy of his card and his receipt to [facility] mail order for the copay savings.  The pt was notified and told to call me is he has any problems or concerns and I will be glad to help him.

## 2015-03-10 NOTE — Telephone Encounter (Signed)
You have been scheduled for an abdominal ultrasound at [facility] (1st floor of hospital) per Dr Olevia Perches office note for 03/19/15 at 8 am. Please arrive 15 minutes prior to your appointment for registration. Make certain not to have anything to eat or drink 6 hours prior to your appointment. Should you need to reschedule your appointment, please contact radiology at [phone]. This test typically takes about 30 minutes to perform.\  Pt has been notified and also he states that the rx was sent to mail order instead of local pharmacy.  I have sent the rx to the local pharmacy and have called the mail order at [phone] to stop the delivery.  The order has already shipped and the cost is $125.  I spoke with Belinda and she states no further shipments will be sent.

## 2015-03-10 NOTE — Telephone Encounter (Signed)
-----   Message from Barron Alvine, [place] sent at 03/07/2015  5:23 PM EDT ----- sch [place]

## 2015-03-11 ENCOUNTER — Ambulatory Visit (HOSPITAL_COMMUNITY): Payer: 59

## 2015-03-14 ENCOUNTER — Ambulatory Visit: Payer: 59 | Admitting: Cardiovascular Disease

## 2015-03-19 ENCOUNTER — Ambulatory Visit (HOSPITAL_COMMUNITY): Payer: 59 | Attending: Internal Medicine

## 2015-04-01 ENCOUNTER — Encounter: Payer: Self-pay | Admitting: Endocrinology

## 2015-04-01 ENCOUNTER — Ambulatory Visit (INDEPENDENT_AMBULATORY_CARE_PROVIDER_SITE_OTHER): Payer: 59 | Admitting: Endocrinology

## 2015-04-01 VITALS — BP 132/88 | HR 92 | Temp 98.1°F | Ht 72.0 in | Wt 195.0 lb

## 2015-04-01 DIAGNOSIS — E108 Type 1 diabetes mellitus with unspecified complications: Secondary | ICD-10-CM

## 2015-04-01 MED ORDER — INSULIN GLARGINE 100 UNIT/ML SOLOSTAR PEN: 65.0000 [IU] | PEN_INJECTOR | SUBCUTANEOUS | Status: DC

## 2015-04-01 NOTE — Patient Instructions (Addendum)
Please continue lantus, 40 units each morning, when you work.  On non-work days, continue 65 units.   on this insulin schedule, it is not safe to miss or delay meals.  It is also not safe to take extra insulin.  Please take just these amounts, no matter what your blood sugar is.   check your blood sugar 2 times a day.  vary the time of day when you check, between before the 3 meals, and at bedtime.  also check if you have symptoms of your blood sugar being too high or too low.  please keep a record of the readings and bring it to your next appointment here.  Also, please write on it why you think it is higher or lower--this is the next step.  please call us sooner if you are having low blood sugar episodes.   Please come back for a follow-up appointment in 2 months.

## 2015-04-01 NOTE — Progress Notes (Signed)
Subjective:    Patient ID: David Lane, male    DOB: [birth year], 69 y.o.   MRN: [id number]  HPI Pt returns for f/u of diabetes mellitus: DM type: Insulin-requiring type 2 Dx'ed: 1988, during an episode of pancreatitis.  Complications: CAD and polyneuropathy Therapy: insulin since 1990 DKA: never Severe hypoglycemia: last episode was in early may, 2014; he says this was probably due to taking more than the prescribed insulin dosage.  Other: in December of 2014, he was changed to qd lantus, for further simplification of his insulin schedule.  He needs less insulin on work days.  V-GO pump was considered, but pt did not keep appt for it.   Interval history: he says he never miss the insulin.  He brings a record of his cbg's which i have reviewed today.  It varies from 95-300's It is higher later in the day, and on weekends, despite taking more insulin on sat and sun am.   Past Medical History  Diagnosis Date  . Hyperlipemia   . Hypertension   . Pancreatitis 2000's  . Hepatic lesion 02/04/11  . GERD (gastroesophageal reflux disease)   . CAD (coronary artery disease)     last cath by [facility] DR.  Mihai Croitoru showing  some  disease involving LCX and small size of Diag   . NSTEMI (non-ST elevated myocardial infarction) 11/21/2009  . Shortness of breath   . CHF exacerbation, due to diastolic dysfunction 25/36/6440  . Ischemic cardiomyopathy     EF 35-40%  . Liver hemangioma   . Type II diabetes mellitus   . Chronic systolic CHF (congestive heart failure), NYHA class 1 07/17/2012    Past Surgical History  Procedure Laterality Date  . Incision and drainage abscess anal  1970's  . Cardiac catheterization  2011    minimal disease, medical management  . Coronary angioplasty with stent placement  07/14/2012    successful PCI & stenting of mid LAD & PDA off the dominant CX  . Left heart catheterization with coronary angiogram N/A 07/14/2012    Procedure: LEFT HEART CATHETERIZATION  WITH CORONARY ANGIOGRAM;  Surgeon: [name], MD;  Location: [facility] CATH LAB;  Service: Cardiovascular;  Laterality: N/A;  . Percutaneous coronary stent intervention (pci-s) Right 07/14/2012    Procedure: PERCUTANEOUS CORONARY STENT INTERVENTION (PCI-S);  Surgeon: [name], MD;  Location: [facility] CATH LAB;  Service: Cardiovascular;  Laterality: Right;    History   Social History  . Marital Status: Single    Spouse Name: N/A  . Number of Children: 0  . Years of Education: N/A   Occupational History  . [employer]   .  [employer]   Social History Main Topics  . Smoking status: Never Smoker   . Smokeless tobacco: Never Used  . Alcohol Use: No  . Drug Use: No  . Sexual Activity: No   Other Topics Concern  . Not on file   Social History Narrative    Current Outpatient Prescriptions on File Prior to Visit  Medication Sig Dispense Refill  . amLODipine (NORVASC) 10 MG tablet Take 1 tablet (10 mg total) by mouth daily. (Patient taking differently: Take 10 mg by mouth daily. Take 2 tablets by mouth daily) 90 tablet 3  . aspirin 81 MG tablet Take 81 mg by mouth daily.      . B-D UF III MINI PEN NEEDLES 31G X 5 MM MISC USE TO INJECT LANTUS DAILY 100 each 5  . Esomeprazole Magnesium (NEXIUM PO)  Take 1 capsule by mouth as needed (for indigestion).    . furosemide (LASIX) 40 MG tablet TAKE 1 TABLET (40 MG TOTAL) BY MOUTH DAILY. 90 tablet 3  . glucose blood (ONE TOUCH ULTRA TEST) test strip 1 each by Other route 2 (two) times daily. And lancets 2/day 250.01 100 each 12  . lipase/protease/amylase (CREON) 36000 UNITS CPEP capsule Take 3 capsules (108,000 Units total) by mouth 3 (three) times daily with meals. And snacks 270 capsule 11  . lisinopril (PRINIVIL,ZESTRIL) 20 MG tablet Take 20 mg by mouth 2 (two) times daily.    . Multiple Vitamin (MULTIVITAMIN) capsule Take 1 capsule by mouth daily. HOLD UNTIL ABLE TO SWALLOW PILLS WHOLE    . Nebivolol HCl 20 MG TABS Take 1 tablet (20 mg  total) by mouth 2 (two) times daily. 90 tablet 2  . nitroGLYCERIN (NITROSTAT) 0.4 MG SL tablet Place 1 tablet (0.4 mg total) under the tongue every 5 (five) minutes as needed for chest pain. 25 tablet 2  . omeprazole (PRILOSEC) 20 MG capsule TAKE ONE CAPSULE TWICE A DAY 180 capsule 0  . simvastatin (ZOCOR) 40 MG tablet Take 1 tablet (40 mg total) by mouth at bedtime. HOLD UNTIL ABLE TO SWALLOW PILLS WHOLE 90 tablet 3  . simvastatin (ZOCOR) 40 MG tablet TAKE 1 TABLET (40 MG TOTAL) BY MOUTH AT BEDTIME. 90 tablet 3  . spironolactone (ALDACTONE) 50 MG tablet Take 1 tablet (50 mg total) by mouth daily. 30 tablet 11   No current facility-administered medications on file prior to visit.    Allergies  Allergen Reactions  . Lipitor [Atorvastatin] Other (See Comments)    Muscle weakness    Family History  Problem Relation Age of Onset  . Diabetes Paternal Grandmother   . Diabetes Maternal Grandfather   . Colon cancer Neg Hx   . Diabetes Mother     BP 132/88 mmHg  Pulse 92  Temp(Src) 98.1 F (36.7 C) (Oral)  Ht 6' (1.829 m)  Wt 195 lb (88.451 kg)  BMI 26.44 kg/m2  SpO2 97%    Review of Systems He denies hypoglycemia.      Objective:   Physical Exam VITAL SIGNS:  See vs page GENERAL: no distress Pulses: dorsalis pedis intact bilat.   MSK: no deformity of the feet CV: no leg edema Skin:  no ulcer on the feet.  normal color and temp on the feet. Neuro: sensation is intact to touch on the feet   Lab Results  Component Value Date   HGBA1C 8.5* 02/17/2015      Assessment & Plan:  DM: he needs increased rx   Patient is advised the following: Patient Instructions  Please continue lantus, 40 units each morning, when you work.  On non-work days, continue 65 units.   on this insulin schedule, it is not safe to miss or delay meals.  It is also not safe to take extra insulin.  Please take just these amounts, no matter what your blood sugar is.   check your blood sugar 2 times a  day.  vary the time of day when you check, between before the 3 meals, and at bedtime.  also check if you have symptoms of your blood sugar being too high or too low.  please keep a record of the readings and bring it to your next appointment here.  Also, please write on it why you think it is higher or lower--this is the next step.  please call us sooner  if you are having low blood sugar episodes.   Please come back for a follow-up appointment in 2 months.

## 2015-05-29 ENCOUNTER — Other Ambulatory Visit: Payer: Self-pay

## 2015-05-29 ENCOUNTER — Other Ambulatory Visit: Payer: Self-pay | Admitting: *Deleted

## 2015-05-29 MED ORDER — SPIRONOLACTONE 50 MG PO TABS: 50.0000 mg | ORAL_TABLET | Freq: Every day | ORAL | Status: DC

## 2015-05-29 MED ORDER — INSULIN GLARGINE 100 UNIT/ML SOLOSTAR PEN: 65.0000 [IU] | PEN_INJECTOR | SUBCUTANEOUS | Status: DC

## 2015-06-03 ENCOUNTER — Ambulatory Visit: Payer: 59 | Admitting: Endocrinology

## 2015-06-11 ENCOUNTER — Ambulatory Visit (INDEPENDENT_AMBULATORY_CARE_PROVIDER_SITE_OTHER): Payer: 59 | Admitting: Endocrinology

## 2015-06-11 ENCOUNTER — Encounter: Payer: Self-pay | Admitting: Endocrinology

## 2015-06-11 VITALS — BP 136/70 | HR 91 | Temp 98.3°F | Ht 72.0 in | Wt 197.0 lb

## 2015-06-11 DIAGNOSIS — E108 Type 1 diabetes mellitus with unspecified complications: Secondary | ICD-10-CM

## 2015-06-11 LAB — POCT GLYCOSYLATED HEMOGLOBIN (HGB A1C): HEMOGLOBIN A1C: 11.4

## 2015-06-11 NOTE — Patient Instructions (Addendum)
Please take lantus, 55 units each morning, when you work.  On non-work days, take 65 units.   on this insulin schedule, it is not safe to miss or delay meals.  Please take just these amounts, no matter what your blood sugar is.   check your blood sugar 2 times a day.  vary the time of day when you check, between before the 3 meals, and at bedtime.  also check if you have symptoms of your blood sugar being too high or too low.  please keep a record of the readings and bring it to your next appointment here.  Also, please write on it why you think it is higher or lower--this is the next step.  please call us sooner if you are having low blood sugar episodes.   Please come back for a follow-up appointment in 2 months.

## 2015-06-11 NOTE — Progress Notes (Signed)
Subjective:    Patient ID: David Lane, male    DOB: [birth year], 69 y.o.   MRN: [id number]  HPI Pt returns for f/u of diabetes mellitus: DM type: Insulin-requiring type 2 Dx'ed: 1988, during an episode of pancreatitis.  Complications: CAD and polyneuropathy.  Therapy: insulin since 1990 DKA: never Severe hypoglycemia: last episode was in 2014; he says this was probably due to taking more than the prescribed insulin dosage.  Other: in December of 2014, he was changed to qd lantus, for further simplification of his insulin schedule.  He needs less insulin on work days.  V-GO pump was considered, but pt did not keep appt for it.   Interval history: he takes lantus, 50 units each morning, when you work days.  On non-work days, he takes 60 units.  he brings a record of his cbg's which i have reviewed today.  He checks in am only.  It varies from 260-383. He says he never misses the insulin.  He will retire in 3 months.  Past Medical History  Diagnosis Date  . Hyperlipemia   . Hypertension   . Pancreatitis 2000's  . Hepatic lesion 02/04/11  . GERD (gastroesophageal reflux disease)   . CAD (coronary artery disease)     last cath by [facility] DR.  Mihai Croitoru showing  some  disease involving LCX and small size of Diag   . NSTEMI (non-ST elevated myocardial infarction) 11/21/2009  . Shortness of breath   . CHF exacerbation, due to diastolic dysfunction 76/81/1572  . Ischemic cardiomyopathy     EF 35-40%  . Liver hemangioma   . Type II diabetes mellitus   . Chronic systolic CHF (congestive heart failure), NYHA class 1 07/17/2012    Past Surgical History  Procedure Laterality Date  . Incision and drainage abscess anal  1970's  . Cardiac catheterization  2011    minimal disease, medical management  . Coronary angioplasty with stent placement  07/14/2012    successful PCI & stenting of mid LAD & PDA off the dominant CX  . Left heart catheterization with coronary angiogram N/A  07/14/2012    Procedure: LEFT HEART CATHETERIZATION WITH CORONARY ANGIOGRAM;  Surgeon: [name], MD;  Location: [facility] CATH LAB;  Service: Cardiovascular;  Laterality: N/A;  . Percutaneous coronary stent intervention (pci-s) Right 07/14/2012    Procedure: PERCUTANEOUS CORONARY STENT INTERVENTION (PCI-S);  Surgeon: [name], MD;  Location: [facility] CATH LAB;  Service: Cardiovascular;  Laterality: Right;    Social History   Social History  . Marital Status: Single    Spouse Name: N/A  . Number of Children: 0  . Years of Education: N/A   Occupational History  . [employer]   .  [employer]   Social History Main Topics  . Smoking status: Never Smoker   . Smokeless tobacco: Never Used  . Alcohol Use: No  . Drug Use: No  . Sexual Activity: No   Other Topics Concern  . Not on file   Social History Narrative    Current Outpatient Prescriptions on File Prior to Visit  Medication Sig Dispense Refill  . amLODipine (NORVASC) 10 MG tablet Take 1 tablet (10 mg total) by mouth daily. (Patient taking differently: Take 10 mg by mouth daily. Take 2 tablets by mouth daily) 90 tablet 3  . aspirin 81 MG tablet Take 81 mg by mouth daily.      . B-D UF III MINI PEN NEEDLES 31G X 5 MM MISC USE TO  INJECT LANTUS DAILY 100 each 5  . Esomeprazole Magnesium (NEXIUM PO) Take 1 capsule by mouth as needed (for indigestion).    . furosemide (LASIX) 40 MG tablet TAKE 1 TABLET (40 MG TOTAL) BY MOUTH DAILY. 90 tablet 3  . glucose blood (ONE TOUCH ULTRA TEST) test strip 1 each by Other route 2 (two) times daily. And lancets 2/day 250.01 100 each 12  . Insulin Glargine (LANTUS SOLOSTAR) 100 UNIT/ML Solostar Pen Inject 65 Units into the skin every morning. 45 mL 2  . lipase/protease/amylase (CREON) 36000 UNITS CPEP capsule Take 3 capsules (108,000 Units total) by mouth 3 (three) times daily with meals. And snacks 270 capsule 11  . lisinopril (PRINIVIL,ZESTRIL) 20 MG tablet Take 20 mg by mouth 2 (two) times  daily.    . Multiple Vitamin (MULTIVITAMIN) capsule Take 1 capsule by mouth daily. HOLD UNTIL ABLE TO SWALLOW PILLS WHOLE    . Nebivolol HCl 20 MG TABS Take 1 tablet (20 mg total) by mouth 2 (two) times daily. 90 tablet 2  . nitroGLYCERIN (NITROSTAT) 0.4 MG SL tablet Place 1 tablet (0.4 mg total) under the tongue every 5 (five) minutes as needed for chest pain. 25 tablet 2  . omeprazole (PRILOSEC) 20 MG capsule TAKE ONE CAPSULE TWICE A DAY 180 capsule 0  . simvastatin (ZOCOR) 40 MG tablet Take 1 tablet (40 mg total) by mouth at bedtime. HOLD UNTIL ABLE TO SWALLOW PILLS WHOLE 90 tablet 3  . spironolactone (ALDACTONE) 50 MG tablet Take 1 tablet (50 mg total) by mouth daily. 90 tablet 0   No current facility-administered medications on file prior to visit.    Allergies  Allergen Reactions  . Lipitor [Atorvastatin] Other (See Comments)    Muscle weakness    Family History  Problem Relation Age of Onset  . Diabetes Paternal Grandmother   . Diabetes Maternal Grandfather   . Colon cancer Neg Hx   . Diabetes Mother     BP 136/70 mmHg  Pulse 91  Temp(Src) 98.3 F (36.8 C) (Oral)  Ht 6' (1.829 m)  Wt 197 lb (89.359 kg)  BMI 26.71 kg/m2  SpO2 96%  Review of Systems Denies hypoglycemia    Objective:   Physical Exam VITAL SIGNS:  See vs page GENERAL: no distress Pulses: dorsalis pedis intact bilat.   MSK: no deformity of the feet.  CV: no leg edema.  Skin:  no ulcer on the feet.  normal color and temp on the feet. Neuro: sensation is intact to touch on the feet.     A1c=11.4%    Assessment & Plan:  DM: severe exacerbation.   Noncompliance with cbg recording, worse: he continues to struggle with functional problems, which I'll work around as best I can.     Patient is advised the following: Patient Instructions  Please take lantus, 55 units each morning, when you work.  On non-work days, take 65 units.   on this insulin schedule, it is not safe to miss or delay meals.   Please take just these amounts, no matter what your blood sugar is.   check your blood sugar 2 times a day.  vary the time of day when you check, between before the 3 meals, and at bedtime.  also check if you have symptoms of your blood sugar being too high or too low.  please keep a record of the readings and bring it to your next appointment here.  Also, please write on it why you think it is  higher or lower--this is the next step.  please call us sooner if you are having low blood sugar episodes.   Please come back for a follow-up appointment in 2 months.

## 2015-07-14 ENCOUNTER — Telehealth: Payer: Self-pay | Admitting: Cardiovascular Disease

## 2015-07-14 MED ORDER — SPIRONOLACTONE 25 MG PO TABS: 25.0000 mg | ORAL_TABLET | Freq: Every day | ORAL | Status: DC

## 2015-07-14 NOTE — Telephone Encounter (Signed)
Returned call to patient.He stated his B/P was low this morning.Stated at 6:00 am he took amlodipine 5 mg and lisinopril 20 mg.B/P at 7:45 am 79/60.9:00 am 95/63.Stated he felt bad,dizzy.Stated he was unable to work.Stated last week B/P low and he held [name].Spoke to Dr.Croitoru he advised to hold furosemide.Advised to hold spironolactone today then decrease to 25 mg daily.Advised to continue all other medications.Advised to weigh daily and call back if weight starts going up.Follow up appointment scheduled with Dr.Croitoru 08/05/15 at 3:45 pm.Advised to call sooner if needed.

## 2015-07-14 NOTE — Telephone Encounter (Signed)
Mr. Schiff is calling because he is not feeling well . Took his bp , his bp is running low . Please call  Thanks

## 2015-07-28 ENCOUNTER — Telehealth: Payer: Self-pay

## 2015-07-28 NOTE — Telephone Encounter (Signed)
Pt requesting refills of Creon. Called to pick new physician. Left vm for pt to call back.

## 2015-08-04 MED ORDER — PANCRELIPASE (LIP-PROT-AMYL) 36000-114000 UNITS PO CPEP: 108000.0000 [IU] | ORAL_CAPSULE | Freq: Three times a day (TID) | ORAL | Status: DC

## 2015-08-04 NOTE — Telephone Encounter (Signed)
Ok to refill Creon until appt

## 2015-08-04 NOTE — Telephone Encounter (Signed)
Pharmacy is requesting refills on creon. Pt has scheduled an appointment with you on 10/08/2015. Is it ok to refill?

## 2015-08-05 ENCOUNTER — Ambulatory Visit (INDEPENDENT_AMBULATORY_CARE_PROVIDER_SITE_OTHER): Payer: 59 | Admitting: Cardiovascular Disease

## 2015-08-05 ENCOUNTER — Encounter: Payer: Self-pay | Admitting: Cardiovascular Disease

## 2015-08-05 VITALS — BP 142/76 | HR 81 | Ht 72.0 in | Wt 196.0 lb

## 2015-08-05 DIAGNOSIS — Z79899 Other long term (current) drug therapy: Secondary | ICD-10-CM

## 2015-08-05 DIAGNOSIS — I10 Essential (primary) hypertension: Secondary | ICD-10-CM

## 2015-08-05 DIAGNOSIS — E785 Hyperlipidemia, unspecified: Secondary | ICD-10-CM

## 2015-08-05 MED ORDER — SIMVASTATIN 20 MG PO TABS: 20.0000 mg | ORAL_TABLET | Freq: Every day | ORAL | Status: DC

## 2015-08-05 MED ORDER — NEBIVOLOL HCL 20 MG PO TABS: 1.0000 | ORAL_TABLET | Freq: Every day | ORAL | Status: DC

## 2015-08-05 NOTE — Progress Notes (Signed)
Patient ID: TAYLAN MAREZ, male   DOB: 18-Mar-1946, 69 y.o.   MRN: [id number]     Cardiology Office Note   Date:  08/07/2015   ID:  Jacarie, Pate 05/18/1946, MRN [id number]  PCP:  [name], MD  Cardiologist:   [name], MD   Chief Complaint  Patient presents with  . Follow-up     problems with blood pressure control       History of Present Illness: MOUSTAFA MOSSA is a 69 y.o. male who presents for follow-up for severe systemic hypertension, coronary artery disease status post previous myocardial infarction, mild ischemic cardiomyopathy, chronic combined systolic and diastolic heart failure  As always, Baptiste is still not happy with his blood pressure control. Having said that, for the most part he is feeling much better. He has not had any episodes of feeling "swimmy headed" at work. He has occasional elevated blood pressure which he feels as a fullness and tingling in his face and neck.  He is tinkering with his medications again and is not taking his beta blocker on a daily basis.  He has not had problems with chest pain or dyspnea either at rest or with activity, although he still works a relatively physically demanding job. He has not had lower extremity edema, syncope, claudication, focal neurological events or serious health issues since we last met.  He has a history of coronary disease and received a stent to the LAD artery and a stent to the posterior descending artery in 2013. He has moderately depressed left ventricular systolic function with an ejection fraction of 35-40% and has previously presented with pulmonary edema secondary to heart failure. A repeat echocardiogram on 08/01/13 showed an improved ejection fraction of 50-55% with mild concentric hypertrophy. He has very brittle diabetes mellitus related to pancreatic insufficiency following acute pancreatitis.   Past Medical History  Diagnosis Date  . Hyperlipemia   . Hypertension   .  Pancreatitis 2000's  . Hepatic lesion 02/04/11  . GERD (gastroesophageal reflux disease)   . CAD (coronary artery disease)     last cath by [facility] DR.  Elden Brucato showing  some  disease involving LCX and small size of Diag   . NSTEMI (non-ST elevated myocardial infarction) ([place]) 11/21/2009  . Shortness of breath   . CHF exacerbation, due to diastolic dysfunction 26/41/5830  . Ischemic cardiomyopathy     EF 35-40%  . Liver hemangioma   . Type II diabetes mellitus ([place])   . Chronic systolic CHF (congestive heart failure), NYHA class 1 ([place]) 07/17/2012    Past Surgical History  Procedure Laterality Date  . Incision and drainage abscess anal  1970's  . Cardiac catheterization  2011    minimal disease, medical management  . Coronary angioplasty with stent placement  07/14/2012    successful PCI & stenting of mid LAD & PDA off the dominant CX  . Left heart catheterization with coronary angiogram N/A 07/14/2012    Procedure: LEFT HEART CATHETERIZATION WITH CORONARY ANGIOGRAM;  Surgeon: [name], MD;  Location: [place] CATH LAB;  Service: Cardiovascular;  Laterality: N/A;  . Percutaneous coronary stent intervention (pci-s) Right 07/14/2012    Procedure: PERCUTANEOUS CORONARY STENT INTERVENTION (PCI-S);  Surgeon: [name], MD;  Location: [facility] CATH LAB;  Service: Cardiovascular;  Laterality: Right;     Current Outpatient Prescriptions  Medication Sig Dispense Refill  . amLODipine (NORVASC) 5 MG tablet Take 5 mg by mouth 2 (two) times daily.    Marland Kitchen aspirin  81 MG tablet Take 81 mg by mouth daily.      . B-D UF III MINI PEN NEEDLES 31G X 5 MM MISC USE TO INJECT LANTUS DAILY 100 each 5  . Esomeprazole Magnesium (NEXIUM PO) Take 1 capsule by mouth as needed (for indigestion).    . furosemide (LASIX) 40 MG tablet TAKE 1 TABLET (40 MG TOTAL) BY MOUTH DAILY. 90 tablet 3  . glucose blood (ONE TOUCH ULTRA TEST) test strip 1 each by Other route 2 (two) times daily. And lancets 2/day 250.01 100 each  12  . Insulin Glargine (LANTUS SOLOSTAR) 100 UNIT/ML Solostar Pen Inject 65 Units into the skin every morning. 45 mL 2  . lipase/protease/amylase (CREON) 36000 UNITS CPEP capsule Take 3 capsules (108,000 Units total) by mouth 3 (three) times daily with meals. And snacks 270 capsule 0  . lisinopril (PRINIVIL,ZESTRIL) 20 MG tablet Take 20 mg by mouth 2 (two) times daily.    . Multiple Vitamin (MULTIVITAMIN) capsule Take 1 capsule by mouth daily. HOLD UNTIL ABLE TO SWALLOW PILLS WHOLE    . Nebivolol HCl 20 MG TABS Take 1 tablet (20 mg total) by mouth daily. 90 tablet 1  . nitroGLYCERIN (NITROSTAT) 0.4 MG SL tablet Place 1 tablet (0.4 mg total) under the tongue every 5 (five) minutes as needed for chest pain. 25 tablet 2  . omeprazole (PRILOSEC) 20 MG capsule TAKE ONE CAPSULE TWICE A DAY 180 capsule 0  . simvastatin (ZOCOR) 20 MG tablet Take 1 tablet (20 mg total) by mouth at bedtime. HOLD UNTIL ABLE TO SWALLOW PILLS WHOLE 90 tablet 1  . spironolactone (ALDACTONE) 25 MG tablet Take 1 tablet (25 mg total) by mouth daily. (Patient taking differently: Take 12.5 mg by mouth daily. ) 30 tablet 6   No current facility-administered medications for this visit.    Allergies:   Lipitor    Social History:  The patient  reports that he has never smoked. He has never used smokeless tobacco. He reports that he does not drink alcohol or use illicit drugs.   Family History:  The patient's family history includes Diabetes in his maternal grandfather, mother, and paternal grandmother. There is no history of Colon cancer.    ROS:  Please see the history of present illness.    Otherwise, review of systems positive for none.   All other systems are reviewed and negative.    PHYSICAL EXAM: VS:  BP 142/76 mmHg  Pulse 81  Ht 6' (1.829 m)  Wt 196 lb (88.905 kg)  BMI 26.58 kg/m2 , BMI Body mass index is 26.58 kg/(m^2).  General: Alert, oriented x3, no distress Head: no evidence of trauma, PERRL, EOMI, no  exophtalmos or lid lag, no myxedema, no xanthelasma; normal ears, nose and oropharynx Neck: normal jugular venous pulsations and no hepatojugular reflux; brisk carotid pulses without delay and no carotid bruits Chest: clear to auscultation, no signs of consolidation by percussion or palpation, normal fremitus, symmetrical and full respiratory excursions Cardiovascular: normal position and quality of the apical impulse, regular rhythm, normal first and second heart sounds, no murmurs, rubs or gallops Abdomen: no tenderness or distention, no masses by palpation, no abnormal pulsatility or arterial bruits, normal bowel sounds, no hepatosplenomegaly Extremities: no clubbing, cyanosis or edema; 2+ radial, ulnar and brachial pulses bilaterally; 2+ right femoral, posterior tibial and dorsalis pedis pulses; 2+ left femoral, posterior tibial and dorsalis pedis pulses; no subclavian or femoral bruits Neurological: grossly nonfocal Psych: euthymic mood, full affect  EKG:  EKG is ordered today. The ekg ordered today demonstrates NSR   Recent Labs: 11/25/2014: ALT 36; BUN 21; Creatinine, Ser 0.92; Hemoglobin 12.9*; Platelets 247.0; Potassium 4.1; Sodium 137; TSH 2.31    Lipid Panel    Component Value Date/Time   CHOL 82 11/25/2014 1339   TRIG 49.0 11/25/2014 1339   HDL 29.60* 11/25/2014 1339   CHOLHDL 3 11/25/2014 1339   VLDL 9.8 11/25/2014 1339   LDLCALC 43 11/25/2014 1339      Wt Readings from Last 3 Encounters:  08/05/15 196 lb (88.905 kg)  06/11/15 197 lb (89.359 kg)  04/01/15 195 lb (88.451 kg)      Other studies Reviewed:  ASSESSMENT AND PLAN:  1. CAD - currently asymptomatic 2. Chronic systolic and diastolic heart failure with improved LV ejection fraction, most recently estimated EF 50%. Euvolemic by physical exam. Functional class I 3. Essential hypertension, severe requiring multiple medications. His blood pressure control is actually quite fair. I have told him that I would  avoid stopping and starting the beta blocker due to the risks of exaggerated swings in blood pressure related with beta blocker rebound. He should always take this medication once daily and at the same time every day. I think that no matter how many times I insist that he avoid self adjusting his blood pressure medications, he will continue to do so. To minimize the impact I have recommended that he always take his amlodipine at bedtime 5 mg, but will have the freedom to not take a morning dose of 5 mg of amlodipine if he considered his blood pressure to be too low. I hope this compromise will allow for decent blood pressure control while still allowing him the liberty to limit the intake of blood pressure medications when he thinks he is at risk of hypotensive symptoms. As before, I think many of his complaints are related to issues surrounding brittle glycemic control and malabsorption dissecting of combined endocrine and exocrine and pancreatic insufficiency. 4. Hyperlipidemia, time to repeat his lipid profile   Current medicines are reviewed at length with the patient today.  The patient has concerns regarding medicines.  Labs/ tests ordered today include:  Orders Placed This Encounter  Procedures  . Lipid panel  . Comprehensive metabolic panel  . EKG 12-Lead    Patient Instructions  CHANGE BYSTOLIC TO ONCE DAILY --------ALWAYS TAKE THIS AT THE SAME TIME OF DAY EVERY DAY.   AMLODIPINE 5 MG TWICE DAILY-----------IF YOU ARE GOING TO SKIP A DOSE SKIP THE MORNING DOSE.   Your physician recommends that you return for lab work in: [place] [facility].  Dr. Sallyanne Kuster recommends that you schedule a follow-up appointment in: 2 MONTHS.        Mikael Spray, MD  08/07/2015 6:13 PM    [name], MD, [facility] [phone] office [phone] pager

## 2015-08-05 NOTE — Patient Instructions (Signed)
CHANGE BYSTOLIC TO ONCE DAILY --------ALWAYS TAKE THIS AT THE SAME TIME OF DAY EVERY DAY.   AMLODIPINE 5 MG TWICE DAILY-----------IF YOU ARE GOING TO SKIP A DOSE SKIP THE MORNING DOSE.   Your physician recommends that you return for lab work in: [place] [facility].  Dr. Sallyanne Kuster recommends that you schedule a follow-up appointment in: 2 MONTHS.

## 2015-08-06 ENCOUNTER — Telehealth: Payer: Self-pay | Admitting: Endocrinology

## 2015-08-11 ENCOUNTER — Ambulatory Visit: Payer: 59 | Admitting: Endocrinology

## 2015-08-22 ENCOUNTER — Other Ambulatory Visit: Payer: Self-pay | Admitting: Cardiovascular Disease

## 2015-08-25 NOTE — Telephone Encounter (Signed)
Rx(s) sent to pharmacy electronically.  

## 2015-09-11 ENCOUNTER — Ambulatory Visit: Payer: 59 | Admitting: Endocrinology

## 2015-09-12 ENCOUNTER — Telehealth: Payer: Self-pay | Admitting: Endocrinology

## 2015-09-12 NOTE — Telephone Encounter (Signed)
Patient no showed today's appt. Please advise on how to follow up. °A. No follow up necessary. °B. Follow up urgent. Contact patient immediately. °C. Follow up necessary. Contact patient and schedule visit in ___ days. °D. Follow up advised. Contact patient and schedule visit in ____weeks. ° °

## 2015-09-14 NOTE — Telephone Encounter (Signed)
Follow up advised. Contact patient and schedule visit in 6 weeks. 

## 2015-09-15 NOTE — Telephone Encounter (Signed)
No show letter mailed to the pt.  

## 2015-10-08 ENCOUNTER — Ambulatory Visit: Payer: 59 | Admitting: Gastroenterology

## 2015-10-10 ENCOUNTER — Telehealth: Payer: Self-pay | Admitting: Endocrinology

## 2015-10-10 NOTE — Telephone Encounter (Signed)
please call patient: Ov is due 

## 2015-10-13 ENCOUNTER — Other Ambulatory Visit: Payer: Self-pay

## 2015-10-13 MED ORDER — GLUCOSE BLOOD VI STRP: 2.0000 | ORAL_STRIP | Freq: Two times a day (BID) | Status: DC

## 2015-10-13 NOTE — Telephone Encounter (Signed)
Appointment letter mailed to the pt.  

## 2015-10-20 ENCOUNTER — Ambulatory Visit (INDEPENDENT_AMBULATORY_CARE_PROVIDER_SITE_OTHER): Payer: 59 | Admitting: Cardiovascular Disease

## 2015-10-20 ENCOUNTER — Encounter: Payer: Self-pay | Admitting: Cardiovascular Disease

## 2015-10-20 VITALS — BP 120/68 | HR 76 | Ht 72.0 in | Wt 196.4 lb

## 2015-10-20 DIAGNOSIS — I2510 Atherosclerotic heart disease of native coronary artery without angina pectoris: Secondary | ICD-10-CM | POA: Diagnosis not present

## 2015-10-20 DIAGNOSIS — I5042 Chronic combined systolic (congestive) and diastolic (congestive) heart failure: Secondary | ICD-10-CM | POA: Diagnosis not present

## 2015-10-20 DIAGNOSIS — E785 Hyperlipidemia, unspecified: Secondary | ICD-10-CM

## 2015-10-20 DIAGNOSIS — I255 Ischemic cardiomyopathy: Secondary | ICD-10-CM | POA: Diagnosis not present

## 2015-10-20 DIAGNOSIS — I10 Essential (primary) hypertension: Secondary | ICD-10-CM | POA: Diagnosis not present

## 2015-10-20 DIAGNOSIS — K8689 Other specified diseases of pancreas: Secondary | ICD-10-CM

## 2015-10-20 DIAGNOSIS — E108 Type 1 diabetes mellitus with unspecified complications: Secondary | ICD-10-CM

## 2015-10-20 NOTE — Patient Instructions (Signed)
SKIP THE AMLODIPINE  IF YOUR BLOOD PRESSURE IS LOW IN THE MORNINGS.  Dr. Sallyanne Kuster recommends that you schedule a follow-up appointment in: 3 MONTHS

## 2015-10-21 NOTE — Progress Notes (Signed)
Patient ID: David Lane, male   DOB: June 10, 1946, 70 y.o.   MRN: [id number]    Cardiology Office Note    Date:  10/21/2015   ID:  Bobbye, Sapia 1946-02-07, MRN [id number]  PCP:  [name], MD  Cardiologist:   [name], MD   Chief Complaint  Patient presents with  . Follow-up    2 month//CAD//pt c/o SOB on exertion, dizzy today accompanied by shoulder aches(when BP is low, he does not take Bystolic)    History of Present Illness:  David Lane is a 70 y.o. male with severe systemic hypertension and mild ischemic cardiomyopathy previously complicated by heart failure exacerbation. He has brittle diabetes mellitus following an episode of severe pancreatitis that caused both endocrine and exocrine pancreatic insufficiency.  As always, David Lane is very concerned about every detail of his blood pressure management. He continues to fiddle with his antihypertensive medications on a daily basis. Despite my advice, he continues to hold his bystolic on days when he believes his blood pressure is too low. (Previously expressed my concern that holding this particular medication is a poor choice since it might cause rebound hemodynamic changes).  He continues to have difficulty maintaining good glycemic control. This may be in part related to unpredictable food absorption in the setting of pancreatic insufficiency, as well as the fact that he is truly insulin deficient. Episodes of hyperglycemia (his blood sugar, only reaches the high 200s) may be causing polyuria and subsequent drops in his blood pressure.  Past Medical History  Diagnosis Date  . Hyperlipemia   . Hypertension   . Pancreatitis 2000's  . Hepatic lesion 02/04/11  . GERD (gastroesophageal reflux disease)   . CAD (coronary artery disease)     last cath by [facility] DR.  Anett Ranker showing  some  disease involving LCX and small size of Diag   . NSTEMI (non-ST elevated myocardial infarction) ([place]) 11/21/2009  .  Shortness of breath   . CHF exacerbation, due to diastolic dysfunction [id number]  . Ischemic cardiomyopathy     EF 35-40%  . Liver hemangioma   . Type II diabetes mellitus ([place])   . Chronic systolic CHF (congestive heart failure), NYHA class 1 ([place]) 07/17/2012    Past Surgical History  Procedure Laterality Date  . Incision and drainage abscess anal  1970's  . Cardiac catheterization  2011    minimal disease, medical management  . Coronary angioplasty with stent placement  07/14/2012    successful PCI & stenting of mid LAD & PDA off the dominant CX  . Left heart catheterization with coronary angiogram N/A 07/14/2012    Procedure: LEFT HEART CATHETERIZATION WITH CORONARY ANGIOGRAM;  Surgeon: [name], MD;  Location: [place] CATH LAB;  Service: Cardiovascular;  Laterality: N/A;  . Percutaneous coronary stent intervention (pci-s) Right 07/14/2012    Procedure: PERCUTANEOUS CORONARY STENT INTERVENTION (PCI-S);  Surgeon: [name], MD;  Location: [facility] CATH LAB;  Service: Cardiovascular;  Laterality: Right;    Outpatient Prescriptions Prior to Visit  Medication Sig Dispense Refill  . amLODipine (NORVASC) 5 MG tablet TAKE 2 TABLETS BY MOUTH EVERY DAY 180 tablet 2  . aspirin 81 MG tablet Take 81 mg by mouth daily.      . B-D UF III MINI PEN NEEDLES 31G X 5 MM MISC USE TO INJECT LANTUS DAILY 100 each 5  . Esomeprazole Magnesium (NEXIUM PO) Take 1 capsule by mouth as needed (for indigestion).    Marland Kitchen glucose blood (ONE TOUCH  ULTRA TEST) test strip 2 each by Other route 2 (two) times daily. DX Code E11.9 200 each 0  . Insulin Glargine (LANTUS SOLOSTAR) 100 UNIT/ML Solostar Pen Inject 65 Units into the skin every morning. 45 mL 2  . lipase/protease/amylase (CREON) 36000 UNITS CPEP capsule Take 3 capsules (108,000 Units total) by mouth 3 (three) times daily with meals. And snacks 270 capsule 0  . lisinopril (PRINIVIL,ZESTRIL) 20 MG tablet Take 20 mg by mouth 2 (two) times daily.    . Multiple  Vitamin (MULTIVITAMIN) capsule Take 1 capsule by mouth daily. HOLD UNTIL ABLE TO SWALLOW PILLS WHOLE    . Nebivolol HCl 20 MG TABS Take 1 tablet (20 mg total) by mouth daily. 90 tablet 1  . nitroGLYCERIN (NITROSTAT) 0.4 MG SL tablet Place 1 tablet (0.4 mg total) under the tongue every 5 (five) minutes as needed for chest pain. 25 tablet 2  . omeprazole (PRILOSEC) 20 MG capsule TAKE ONE CAPSULE TWICE A DAY 180 capsule 0  . simvastatin (ZOCOR) 20 MG tablet Take 1 tablet (20 mg total) by mouth at bedtime. HOLD UNTIL ABLE TO SWALLOW PILLS WHOLE 90 tablet 1  . spironolactone (ALDACTONE) 25 MG tablet Take 1 tablet (25 mg total) by mouth daily. (Patient taking differently: Take 12.5 mg by mouth daily. ) 30 tablet 6  . amLODipine (NORVASC) 5 MG tablet Take 5 mg by mouth 2 (two) times daily.    . furosemide (LASIX) 40 MG tablet TAKE 1 TABLET (40 MG TOTAL) BY MOUTH DAILY. 90 tablet 3   No facility-administered medications prior to visit.     Allergies:   Lipitor   Social History   Social History  . Marital Status: Single    Spouse Name: N/A  . Number of Children: 0  . Years of Education: N/A   Occupational History  . [employer]   .  [employer]   Social History Main Topics  . Smoking status: Never Smoker   . Smokeless tobacco: Never Used  . Alcohol Use: No  . Drug Use: No  . Sexual Activity: No   Other Topics Concern  . None   Social History Narrative     Family History:  The patient's family history includes Diabetes in his maternal grandfather, mother, and paternal grandmother. There is no history of Colon cancer.   ROS:   Please see the history of present illness.    ROS All other systems reviewed and are negative.   PHYSICAL EXAM:   VS:  BP 120/68 mmHg  Pulse 76  Ht 6' (1.829 m)  Wt 196 lb 6.4 oz (89.086 kg)  BMI 26.63 kg/m2   GEN: Well nourished, well developed, in no acute distress HEENT: normal Neck: no JVD, carotid bruits, or masses Cardiac: RRR; no murmurs,  rubs, or gallops,no edema  Respiratory:  clear to auscultation bilaterally, normal work of breathing GI: soft, nontender, nondistended, + BS MS: no deformity or atrophy Skin: warm and dry, no rash Neuro:  Alert and Oriented x 3, Strength and sensation are intact Psych: euthymic mood, full affect  Wt Readings from Last 3 Encounters:  10/20/15 196 lb 6.4 oz (89.086 kg)  08/05/15 196 lb (88.905 kg)  06/11/15 197 lb (89.359 kg)      Studies/Labs Reviewed:   EKG:  EKG is not ordered today.    Recent Labs: 11/25/2014: ALT 36; BUN 21; Creatinine, Ser 0.92; Hemoglobin 12.9*; Platelets 247.0; Potassium 4.1; Sodium 137; TSH 2.31   Lipid Panel  Component Value Date/Time   CHOL 82 11/25/2014 1339   TRIG 49.0 11/25/2014 1339   HDL 29.60* 11/25/2014 1339   CHOLHDL 3 11/25/2014 1339   VLDL 9.8 11/25/2014 1339   LDLCALC 43 11/25/2014 1339    ASSESSMENT:    1. Chronic combined systolic and diastolic CHF (congestive heart failure) ([place])   2. Ischemic cardiomyopathy, EF 50% by echo 08/01/13   3. Essential hypertension   4. Coronary artery disease involving native coronary artery of native heart without angina pectoris   5. Hyperlipidemia   6. Type 1 diabetes mellitus with complications (H[facility])   7. Pancreatic insufficiency ([facility])      PLAN:  In order of problems listed above:  1. CHF: NYHA class I, euvolemic.  2. Isch CMP: Most recent assessment of ejection fraction showed improvement with EF 50%  3. HTN: I believe we have finally identified a good blood pressure regimen for David Lane. I am a little frustrated that he seems to be changing his medications again. I have asked him to take the beta blocker on a scheduled basis. If he insists on holding the medication, it is better that he hold the amlodipine.   4. CAD: Angina free despite active lifestyle  5. HLP: Excellent lipid profile  6. DM: Type 1 diabetes due to pancreatic failure following acute pancreatitis, variable  control 7. Pancreatic insufficiency might be contributing to less predictable exertion of nutrients and medications; this may explain some of the volatility and glycemic control, volume status and blood pressure.  David Lane is very anxious. He requires constant reassurance. Will keep his appointments on at least an every three-month basis.  Medication Adjustments/Labs and Tests Ordered: Current medicines are reviewed at length with the patient today.  Concerns regarding medicines are outlined above.  Medication changes, Labs and Tests ordered today are listed in the Patient Instructions below. Patient Instructions  SKIP THE AMLODIPINE  IF YOUR BLOOD PRESSURE IS LOW IN THE MORNINGS.  Dr. Sallyanne Kuster recommends that you schedule a follow-up appointment in: [place], Vashon Arch, MD  10/21/2015 4:51 PM    [facility] [address], [place], [place]  [address] Phone: [phone]; Fax: [phone]

## 2015-12-02 ENCOUNTER — Other Ambulatory Visit: Payer: Self-pay | Admitting: Endocrinology

## 2015-12-02 ENCOUNTER — Other Ambulatory Visit: Payer: Self-pay | Admitting: Cardiovascular Disease

## 2015-12-02 ENCOUNTER — Ambulatory Visit: Payer: 59 | Admitting: Gastroenterology

## 2015-12-03 ENCOUNTER — Telehealth: Payer: Self-pay | Admitting: Gastroenterology

## 2015-12-03 NOTE — Telephone Encounter (Signed)
REFILL 

## 2015-12-03 NOTE — Telephone Encounter (Signed)
Dr Silverio Decamp, This patient needs 90 day supply of creon sent into pharmacy. He cancelled his appointment with you and has not been seen by Olevia Perches since last June. Can we refill or does he need an office appointment with you before we send it in

## 2015-12-03 NOTE — Telephone Encounter (Signed)
Ok to send refill and please have him schedule f/u visit for further refills

## 2015-12-04 NOTE — Telephone Encounter (Signed)
We refilled patients Creon scheduled appointment to see Dr Silverio Decamp 01/14/2016 at 2:45pm patient aware of date and time

## 2015-12-14 DIAGNOSIS — K8689 Other specified diseases of pancreas: Principal | ICD-10-CM

## 2015-12-14 DIAGNOSIS — E089 Diabetes mellitus due to underlying condition without complications: Secondary | ICD-10-CM | POA: Insufficient documentation

## 2015-12-14 NOTE — Progress Notes (Signed)
Subjective:    Patient ID: David Lane, male    DOB: 1946-06-28, 70 y.o.   MRN: [id number]  HPI Pt returns for f/u of diabetes mellitus: DM type: due to pancreatic insufficiency Dx'ed: 1988, during an episode of pancreatitis.   Complications: CAD and polyneuropathy.  Therapy: insulin since 1990 DKA: never Severe hypoglycemia: last episode was in 2014; he says this was probably due to taking more than the prescribed insulin dosage.  Other: in December of 2014, he was changed to qd lantus, for further simplification of his insulin schedule.  V-GO pump was considered, but pt did not keep appt for it.  Interval history: he takes lantus, 60 units qam.  he brings a record of his cbg's which i have reviewed today.  He checks in am only.  It varies from 103-300's.  The 103 was during a recent URI.  He says he never misses the insulin. He has retired.   Past Medical History  Diagnosis Date  . Hyperlipemia   . Hypertension   . Pancreatitis 2000's  . Hepatic lesion 02/04/11  . GERD (gastroesophageal reflux disease)   . CAD (coronary artery disease)     last cath by [facility] DR.  Mihai Croitoru showing  some  disease involving LCX and small size of Diag   . NSTEMI (non-ST elevated myocardial infarction) ([place]) 11/21/2009  . Shortness of breath   . CHF exacerbation, due to diastolic dysfunction [id number]  . Ischemic cardiomyopathy     EF 35-40%  . Liver hemangioma   . Type II diabetes mellitus ([place])   . Chronic systolic CHF (congestive heart failure), NYHA class 1 ([place]) 07/17/2012    Past Surgical History  Procedure Laterality Date  . Incision and drainage abscess anal  1970's  . Cardiac catheterization  2011    minimal disease, medical management  . Coronary angioplasty with stent placement  07/14/2012    successful PCI & stenting of mid LAD & PDA off the dominant CX  . Left heart catheterization with coronary angiogram N/A 07/14/2012    Procedure: LEFT HEART CATHETERIZATION WITH  CORONARY ANGIOGRAM;  Surgeon: [name], MD;  Location: [place] CATH LAB;  Service: Cardiovascular;  Laterality: N/A;  . Percutaneous coronary stent intervention (pci-s) Right 07/14/2012    Procedure: PERCUTANEOUS CORONARY STENT INTERVENTION (PCI-S);  Surgeon: [name], MD;  Location[facility] CATH LAB;  Service: Cardiovascular;  Laterality: Right;    Social History   Social History  . Marital Status: Single    Spouse Name: N/A  . Number of Children: 0  . Years of Education: N/A   Occupational History  . [employer]   .  [employer]   Social History Main Topics  . Smoking status: Never Smoker   . Smokeless tobacco: Never Used  . Alcohol Use: No  . Drug Use: No  . Sexual Activity: No   Other Topics Concern  . Not on file   Social History Narrative    Current Outpatient Prescriptions on File Prior to Visit  Medication Sig Dispense Refill  . amLODipine (NORVASC) 5 MG tablet TAKE 2 TABLETS BY MOUTH EVERY DAY 180 tablet 2  . aspirin 81 MG tablet Take 81 mg by mouth daily.      . B-D UF III MINI PEN NEEDLES 31G X 5 MM MISC USE TO INJECT LANTUS DAILY 100 each 3  . Esomeprazole Magnesium (NEXIUM PO) Take 1 capsule by mouth as needed (for indigestion).    Marland Kitchen glucose blood (ONE  TOUCH ULTRA TEST) test strip 2 each by Other route 2 (two) times daily. DX Code E11.9 200 each 0  . lipase/protease/amylase (CREON) 36000 UNITS CPEP capsule Take 3 capsules (108,000 Units total) by mouth 3 (three) times daily with meals. And snacks 270 capsule 0  . Multiple Vitamin (MULTIVITAMIN) capsule Take 1 capsule by mouth daily. HOLD UNTIL ABLE TO SWALLOW PILLS WHOLE    . Nebivolol HCl 20 MG TABS Take 1 tablet (20 mg total) by mouth daily. 90 tablet 1  . nitroGLYCERIN (NITROSTAT) 0.4 MG SL tablet Place 1 tablet (0.4 mg total) under the tongue every 5 (five) minutes as needed for chest pain. 25 tablet 2  . omeprazole (PRILOSEC) 20 MG capsule TAKE ONE CAPSULE TWICE A DAY 180 capsule 0  . ONE TOUCH ULTRA  TEST test strip USE 2 TIMES DAILY AS DIRECTED. USE LANCETS 2 TIMES A DAY 100 each 2  . simvastatin (ZOCOR) 20 MG tablet Take 1 tablet (20 mg total) by mouth at bedtime. HOLD UNTIL ABLE TO SWALLOW PILLS WHOLE 90 tablet 1  . spironolactone (ALDACTONE) 25 MG tablet Take 1 tablet (25 mg total) by mouth daily. (Patient taking differently: Take 12.5 mg by mouth daily. ) 30 tablet 6  . spironolactone (ALDACTONE) 50 MG tablet TAKE 1 TABLET (50 MG TOTAL) BY MOUTH DAILY. 90 tablet 0   No current facility-administered medications on file prior to visit.    Allergies  Allergen Reactions  . Lipitor [Atorvastatin] Other (See Comments)    Muscle weakness    Family History  Problem Relation Age of Onset  . Diabetes Paternal Grandmother   . Diabetes Maternal Grandfather   . Colon cancer Neg Hx   . Diabetes Mother     BP 134/82 mmHg  Pulse 80  Ht 6' (1.829 m)  Wt 195 lb (88.451 kg)  BMI 26.44 kg/m2  SpO2 97%   Review of Systems He denies hypoglycemia.     Objective:   Physical Exam VITAL SIGNS:  See vs page GENERAL: no distress Pulses: dorsalis pedis intact bilat.   MSK: no deformity of the feet CV: no leg edema Skin:  no ulcer on the feet.  normal color and temp on the feet.  Neuro: sensation is intact to touch on the feet, but decreased from normal.     A1c=8.4%    Assessment & Plan:  DM: he needs increased rx.  Patient is advised the following: Patient Instructions  Please take basaglar, 65 units each morning.  Please see Vaughan Basta, to consider a V-GO device.  on this insulin schedule, it is not safe to miss or delay meals.  Please take just these amounts, no matter what your blood sugar is.   check your blood sugar 2 times a day.  vary the time of day when you check, between before the 3 meals, and at bedtime.  also check if you have symptoms of your blood sugar being too high or too low.  please keep a record of the readings and bring it to your next appointment here.  Also,  please write on it why you think it is higher or lower--this is the next step.  please call us sooner if you are having low blood sugar episodes.   Please come back for a follow-up appointment in 2 months.

## 2015-12-15 ENCOUNTER — Ambulatory Visit (INDEPENDENT_AMBULATORY_CARE_PROVIDER_SITE_OTHER): Payer: 59 | Admitting: Endocrinology

## 2015-12-15 ENCOUNTER — Other Ambulatory Visit: Payer: Self-pay | Admitting: *Deleted

## 2015-12-15 ENCOUNTER — Other Ambulatory Visit: Payer: Self-pay | Admitting: Cardiovascular Disease

## 2015-12-15 ENCOUNTER — Encounter: Payer: Self-pay | Admitting: Endocrinology

## 2015-12-15 VITALS — BP 134/82 | HR 80 | Ht 72.0 in | Wt 195.0 lb

## 2015-12-15 DIAGNOSIS — E089 Diabetes mellitus due to underlying condition without complications: Secondary | ICD-10-CM | POA: Diagnosis not present

## 2015-12-15 DIAGNOSIS — K8689 Other specified diseases of pancreas: Secondary | ICD-10-CM | POA: Diagnosis not present

## 2015-12-15 LAB — POCT GLYCOSYLATED HEMOGLOBIN (HGB A1C): HEMOGLOBIN A1C: 8.4

## 2015-12-15 MED ORDER — BASAGLAR KWIKPEN 100 UNIT/ML ~~LOC~~ SOPN: 65.0000 [IU] | PEN_INJECTOR | SUBCUTANEOUS | Status: DC

## 2015-12-15 NOTE — Telephone Encounter (Signed)
Rx refill sent to pharmacy. 

## 2015-12-15 NOTE — Patient Instructions (Addendum)
Please take basaglar, 65 units each morning.  Please see Vaughan Basta, to consider a V-GO device.  on this insulin schedule, it is not safe to miss or delay meals.  Please take just these amounts, no matter what your blood sugar is.   check your blood sugar 2 times a day.  vary the time of day when you check, between before the 3 meals, and at bedtime.  also check if you have symptoms of your blood sugar being too high or too low.  please keep a record of the readings and bring it to your next appointment here.  Also, please write on it why you think it is higher or lower--this is the next step.  please call us sooner if you are having low blood sugar episodes.   Please come back for a follow-up appointment in 2 months.

## 2015-12-23 ENCOUNTER — Encounter: Payer: 59 | Attending: Endocrinology | Admitting: Nutrition

## 2015-12-23 DIAGNOSIS — K8689 Other specified diseases of pancreas: Secondary | ICD-10-CM | POA: Diagnosis not present

## 2015-12-23 DIAGNOSIS — IMO0001 Reserved for inherently not codable concepts without codable children: Secondary | ICD-10-CM

## 2015-12-23 DIAGNOSIS — E089 Diabetes mellitus due to underlying condition without complications: Secondary | ICD-10-CM | POA: Diagnosis present

## 2015-12-23 DIAGNOSIS — E0865 Diabetes mellitus due to underlying condition with hyperglycemia: Secondary | ICD-10-CM

## 2015-12-23 NOTE — Progress Notes (Signed)
Mr. Whary was shown the v-go and we discussed how it works, and how to wear it.  He is very much afraid of the fast acing insulin, and "does not like it".  He is working for [employer] for one more month, and does not want to start this while he is working.  He is afraid he will pull it off while working.  He was also interested in the insulin pump.  We discussed this, but when he learned that he would have to put into the pump the servings of carbohydrates, he did not want to do this.   He filled out paperwork to see what his Medicare will pay for the V-Go.  He is also wanting to get VA benefits, and says he will contact them this week about signing up for them.  He does not want to do this until he has VA benefits.   He was told that we can try this for 6 days free of charge, and he is willing to do this after the end of April--after he retires.  He was given my card and told to call me when he hears from [place] about his Medicare coverage for the V-go.  He agreed to do this.

## 2015-12-23 NOTE — Patient Instructions (Signed)
Call when you retire, and wish to try the V-go for 6 days.

## 2015-12-25 ENCOUNTER — Other Ambulatory Visit: Payer: Self-pay

## 2015-12-25 MED ORDER — GLUCOSE BLOOD VI STRP: ORAL_STRIP | Status: DC

## 2016-01-12 ENCOUNTER — Ambulatory Visit (INDEPENDENT_AMBULATORY_CARE_PROVIDER_SITE_OTHER): Payer: 59 | Admitting: Endocrinology

## 2016-01-12 ENCOUNTER — Telehealth: Payer: Self-pay | Admitting: Endocrinology

## 2016-01-12 ENCOUNTER — Encounter: Payer: Self-pay | Admitting: Endocrinology

## 2016-01-12 ENCOUNTER — Telehealth: Payer: Self-pay | Admitting: Nutrition

## 2016-01-12 VITALS — BP 128/78 | HR 90 | Temp 98.4°F | Ht 72.0 in | Wt 199.0 lb

## 2016-01-12 DIAGNOSIS — K8689 Other specified diseases of pancreas: Secondary | ICD-10-CM

## 2016-01-12 DIAGNOSIS — E089 Diabetes mellitus due to underlying condition without complications: Secondary | ICD-10-CM | POA: Diagnosis not present

## 2016-01-12 MED ORDER — INSULIN ASPART 100 UNIT/ML ~~LOC~~ SOLN: SUBCUTANEOUS | Status: DC

## 2016-01-12 MED ORDER — GLUCOSE BLOOD VI STRP: 1.0000 | ORAL_STRIP | Freq: Four times a day (QID) | Status: DC

## 2016-01-12 MED ORDER — CEPHALEXIN 500 MG PO CAPS: 500.0000 mg | ORAL_CAPSULE | Freq: Three times a day (TID) | ORAL | Status: DC

## 2016-01-12 MED ORDER — V-GO 20 KIT: 1.0000 | PACK | Freq: Every day | Status: DC

## 2016-01-12 NOTE — Telephone Encounter (Signed)
Dr Loanne Drilling want you to see this patient tomorrow but there weren't any available slots, patient asked you to call him, tomorrow is the only time he can come for an appointment.

## 2016-01-12 NOTE — Telephone Encounter (Signed)
See note below. Pt scheduled to come in today at 215pm

## 2016-01-12 NOTE — Telephone Encounter (Signed)
Pt called and said that he had discussed with Dr. Loanne Drilling and Vaughan Basta about the [place] and that he didn't want to try to start it at the time because of his work, but he is retiring in the next week and wants to know if it is possible to try the VGo first before he tries to get it through insurance.  He said that he had previously discussed this with him.

## 2016-01-12 NOTE — Progress Notes (Signed)
Subjective:    Patient ID: David Lane, male    DOB: 01-Aug-1946, 70 y.o.   MRN: [id number]  HPI Pt returns for f/u of diabetes mellitus: DM type: due to pancreatic insufficiency Dx'ed: 1988, during an episode of pancreatitis.   Complications: CAD, retinopathy, and polyneuropathy.  Therapy: insulin since 1990 DKA: never.   Severe hypoglycemia: last episode was in 2014; he says this was probably due to taking more than the prescribed insulin dosage.  Other: in December of 2014, he was changed to qd lantus, for further simplification of his insulin schedule.  V-GO pump was considered, but pt did not keep appt for it.  Interval history: Pt states 1 week of ulcer on the right foot.  He feels this started with wearing new boots.  cbg's continue to be variable.   Past Medical History  Diagnosis Date  . Hyperlipemia   . Hypertension   . Pancreatitis 2000's  . Hepatic lesion 02/04/11  . GERD (gastroesophageal reflux disease)   . CAD (coronary artery disease)     last cath by [facility] DR.  Mihai Croitoru showing  some  disease involving LCX and small size of Diag   . NSTEMI (non-ST elevated myocardial infarction) ([place]) 11/21/2009  . Shortness of breath   . CHF exacerbation, due to diastolic dysfunction [id number]  . Ischemic cardiomyopathy     EF 35-40%  . Liver hemangioma   . Type II diabetes mellitus ([place])   . Chronic systolic CHF (congestive heart failure), NYHA class 1 ([place]) 07/17/2012    Past Surgical History  Procedure Laterality Date  . Incision and drainage abscess anal  1970's  . Cardiac catheterization  2011    minimal disease, medical management  . Coronary angioplasty with stent placement  07/14/2012    successful PCI & stenting of mid LAD & PDA off the dominant CX  . Left heart catheterization with coronary angiogram N/A 07/14/2012    Procedure: LEFT HEART CATHETERIZATION WITH CORONARY ANGIOGRAM;  Surgeon: [name], MD;  Location: [place] CATH LAB;  Service:  Cardiovascular;  Laterality: N/A;  . Percutaneous coronary stent intervention (pci-s) Right 07/14/2012    Procedure: PERCUTANEOUS CORONARY STENT INTERVENTION (PCI-S);  Surgeon: [name], MD;  Location: [facility] CATH LAB;  Service: Cardiovascular;  Laterality: Right;    Social History   Social History  . Marital Status: Single    Spouse Name: N/A  . Number of Children: 0  . Years of Education: N/A   Occupational History  . [employer]   .  [employer]   Social History Main Topics  . Smoking status: Never Smoker   . Smokeless tobacco: Never Used  . Alcohol Use: No  . Drug Use: No  . Sexual Activity: No   Other Topics Concern  . Not on file   Social History Narrative    Current Outpatient Prescriptions on File Prior to Visit  Medication Sig Dispense Refill  . amLODipine (NORVASC) 5 MG tablet TAKE 2 TABLETS BY MOUTH EVERY DAY 180 tablet 2  . aspirin 81 MG tablet Take 81 mg by mouth daily.      . B-D UF III MINI PEN NEEDLES 31G X 5 MM MISC USE TO INJECT LANTUS DAILY 100 each 3  . Esomeprazole Magnesium (NEXIUM PO) Take 1 capsule by mouth as needed (for indigestion).    Marland Kitchen glucose blood (ONE TOUCH ULTRA TEST) test strip 2 each by Other route 2 (two) times daily. DX Code E11.9 200 each 0  .  lipase/protease/amylase (CREON) 36000 UNITS CPEP capsule Take 3 capsules (108,000 Units total) by mouth 3 (three) times daily with meals. And snacks 270 capsule 0  . lisinopril (PRINIVIL,ZESTRIL) 20 MG tablet TAKE 1 TABLET BY MOUTH TWICE A DAY 180 tablet 1  . Multiple Vitamin (MULTIVITAMIN) capsule Take 1 capsule by mouth daily. HOLD UNTIL ABLE TO SWALLOW PILLS WHOLE    . Nebivolol HCl 20 MG TABS Take 1 tablet (20 mg total) by mouth daily. 90 tablet 1  . nitroGLYCERIN (NITROSTAT) 0.4 MG SL tablet Place 1 tablet (0.4 mg total) under the tongue every 5 (five) minutes as needed for chest pain. 25 tablet 2  . omeprazole (PRILOSEC) 20 MG capsule TAKE ONE CAPSULE TWICE A DAY 180 capsule 0  .  simvastatin (ZOCOR) 20 MG tablet Take 1 tablet (20 mg total) by mouth at bedtime. HOLD UNTIL ABLE TO SWALLOW PILLS WHOLE 90 tablet 1  . spironolactone (ALDACTONE) 25 MG tablet Take 1 tablet (25 mg total) by mouth daily. (Patient taking differently: Take 12.5 mg by mouth daily. ) 30 tablet 6  . spironolactone (ALDACTONE) 50 MG tablet TAKE 1 TABLET (50 MG TOTAL) BY MOUTH DAILY. 90 tablet 0   No current facility-administered medications on file prior to visit.    Allergies  Allergen Reactions  . Lipitor [Atorvastatin] Other (See Comments)    Muscle weakness    Family History  Problem Relation Age of Onset  . Diabetes Paternal Grandmother   . Diabetes Maternal Grandfather   . Colon cancer Neg Hx   . Diabetes Mother     BP 128/78 mmHg  Pulse 90  Temp(Src) 98.4 F (36.9 C) (Oral)  Ht 6' (1.829 m)  Wt 199 lb (90.266 kg)  BMI 26.98 kg/m2  SpO2 98%  Review of Systems Denies fever    Objective:   Physical Exam VITAL SIGNS:  See vs page GENERAL: no distress Pulses: dorsalis pedis intact bilat.   MSK: no deformity of the feet CV: no leg edema Skin:  There is a 1 cm shallow ulcer at the plantar aspect of the right foot.  There is a 1 cm rim of erythema.  normal temp on the feet. Neuro: sensation is intact to touch on the feet, but decreased from normal.       Assessment & Plan:  DM: ongoing poor control. Foot ulcer, new.    Patient is advised the following: Patient Instructions  i have sent a prescription to your pharmacy, for an antibiotic pill.   You will get better much faster if you elevate your foot above the rest of your body.   Keep the ulcer covered with antibiotic ointment and a bandaid.   Please start the V-GO-20 device.  Start with the -20 size, with 2 clicks per meal (1 click is 2 units). Please see Vaughan Basta to [facility], to get started.   Please come back for a follow-up appointment in 1-2 weeks.  check your blood sugar 4 times a day: before the 3 meals, and at  bedtime.  also check if you have symptoms of your blood sugar being too high or too low.  please keep a record of the readings and bring it to your next appointment here (or you can bring the meter itself).  You can write it on any piece of paper.  please call us sooner if your blood sugar goes below 70, or if you have a lot of readings over 200.

## 2016-01-12 NOTE — Patient Instructions (Addendum)
i have sent a prescription to your pharmacy, for an antibiotic pill.   You will get better much faster if you elevate your foot above the rest of your body.   Keep the ulcer covered with antibiotic ointment and a bandaid.   Please start the V-GO-20 device.  Start with the -20 size, with 2 clicks per meal (1 click is 2 units). Please see Vaughan Basta to [facility], to get started.   Please come back for a follow-up appointment in 1-2 weeks.  check your blood sugar 4 times a day: before the 3 meals, and at bedtime.  also check if you have symptoms of your blood sugar being too high or too low.  please keep a record of the readings and bring it to your next appointment here (or you can bring the meter itself).  You can write it on any piece of paper.  please call us sooner if your blood sugar goes below 70, or if you have a lot of readings over 200.

## 2016-01-13 NOTE — Telephone Encounter (Signed)
Error

## 2016-01-14 ENCOUNTER — Other Ambulatory Visit (INDEPENDENT_AMBULATORY_CARE_PROVIDER_SITE_OTHER): Payer: 59

## 2016-01-14 ENCOUNTER — Ambulatory Visit (INDEPENDENT_AMBULATORY_CARE_PROVIDER_SITE_OTHER): Payer: 59 | Admitting: Gastroenterology

## 2016-01-14 ENCOUNTER — Encounter: Payer: Self-pay | Admitting: Gastroenterology

## 2016-01-14 VITALS — BP 126/60 | HR 62 | Ht 72.0 in | Wt 199.0 lb

## 2016-01-14 DIAGNOSIS — K861 Other chronic pancreatitis: Secondary | ICD-10-CM

## 2016-01-14 DIAGNOSIS — K8689 Other specified diseases of pancreas: Secondary | ICD-10-CM | POA: Diagnosis not present

## 2016-01-14 LAB — BASIC METABOLIC PANEL
BUN: 22 mg/dL (ref 6–23)
CHLORIDE: 106 meq/L (ref 96–112)
CO2: 29 mEq/L (ref 19–32)
Calcium: 9.1 mg/dL (ref 8.4–10.5)
Creatinine, Ser: 0.99 mg/dL (ref 0.40–1.50)
GFR: 80.06 mL/min (ref >60.00)
Glucose, Bld: 159 mg/dL — ABNORMAL HIGH (ref 70–99)
POTASSIUM: 4.5 meq/L (ref 3.5–5.1)
SODIUM: 140 meq/L (ref 135–145)

## 2016-01-14 NOTE — Patient Instructions (Signed)
You have been scheduled for a CT scan of the abdomen and pelvis at [facility] ([address]---this is in the same building as [facility]).   You are scheduled on 01/16/2016 at [facility] should arrive 15 minutes prior to your appointment time for registration. Please follow the written instructions below on the day of your exam:  WARNING: IF YOU ARE ALLERGIC TO IODINE/X-RAY DYE, PLEASE NOTIFY RADIOLOGY IMMEDIATELY AT [phone]! YOU WILL BE GIVEN A 13 HOUR PREMEDICATION PREP.  1) Do not eat or drink anything after 3am (4 hours prior to your test) 2) You have been given 2 bottles of oral contrast to drink. The solution may taste               better if refrigerated, but do NOT add ice or any other liquid to this solution. Shake             well before drinking.    Drink 1 bottle of contrast @ 7am (2 hours prior to your exam)  Drink 1 bottle of contrast @ 8am (1 hour prior to your exam)  You may take any medications as prescribed with a small amount of water except for the following: Metformin, Glucophage, Glucovance, Avandamet, Riomet, Fortamet, Actoplus Met, Janumet, Glumetza or Metaglip. The above medications must be held the day of the exam AND 48 hours after the exam.  The purpose of you drinking the oral contrast is to aid in the visualization of your intestinal tract. The contrast solution may cause some diarrhea. Before your exam is started, you will be given a small amount of fluid to drink. Depending on your individual set of symptoms, you may also receive an intravenous injection of x-ray contrast/dye. Plan on being at [facility] for 30 minutes or longer, depending on the type of exam you are having performed.  This test typically takes 30-45 minutes to complete.  If you have any questions regarding your exam or if you need to reschedule, you may call the CT department at [phone] between the hours of 8:00 am and 5:00 pm,  Monday-Friday.  ________________________________________________________________________

## 2016-01-14 NOTE — Progress Notes (Signed)
David Lane    [id number]    01/11/1946  Primary Care Physician:TODD,JEFFREY Zenia Resides, MD  Referring Physician: Dorena Cookey, MD [place], [place] [address]  Chief complaint:  Chronic pancreatitis  HPI: 70 year old white male with chronic pancreatitis and pancreatic insufficiency following prolonged hospitalization for necrotizing pancreatitis in 1990s ( Dr Watt Climes), the patient had no clear etiology for the necrotizing pancreatitis.  He also has diabetes. Most recent upper abdominal ultrasound 2012 showed poor visualization of the pancreas. Gallbladder appeared normal. Upper endoscopy and colonoscopy 2012 revealed question of esophageal varices and normal colon. He is on total 72,000 units of Creon with each meal and 36,000 with each snack. He is doing well on Creon and has no diarrhea or GI symptoms . Denies any nausea, vomiting, abdominal pain, melena or bright red blood per rectum  Outpatient Encounter Prescriptions as of 01/14/2016  Medication Sig  . amLODipine (NORVASC) 5 MG tablet TAKE 2 TABLETS BY MOUTH EVERY DAY  . aspirin 81 MG tablet Take 81 mg by mouth daily.    Marland Kitchen glucose blood (ONE TOUCH ULTRA TEST) test strip 1 each by Other route 4 (four) times daily. And lancets 4/day  . insulin aspart (NOVOLOG) 100 UNIT/ML injection For use in pump, for a total of 60 units per day  . Insulin Disposable Pump (V-GO 20) KIT 1 Device by Does not apply route daily.  . lipase/protease/amylase (CREON) 36000 UNITS CPEP capsule Take 3 capsules (108,000 Units total) by mouth 3 (three) times daily with meals. And snacks  . Multiple Vitamin (MULTIVITAMIN) capsule Take 1 capsule by mouth daily. HOLD UNTIL ABLE TO SWALLOW PILLS WHOLE  . Nebivolol HCl 20 MG TABS Take 1 tablet (20 mg total) by mouth daily.  . simvastatin (ZOCOR) 20 MG tablet Take 1 tablet (20 mg total) by mouth at bedtime. HOLD UNTIL ABLE TO SWALLOW PILLS WHOLE  . spironolactone (ALDACTONE) 50 MG tablet  TAKE 1 TABLET (50 MG TOTAL) BY MOUTH DAILY.  . [DISCONTINUED] nitroGLYCERIN (NITROSTAT) 0.4 MG SL tablet Place 1 tablet (0.4 mg total) under the tongue every 5 (five) minutes as needed for chest pain.  . [DISCONTINUED] omeprazole (PRILOSEC) 20 MG capsule TAKE ONE CAPSULE TWICE A DAY  . [DISCONTINUED] spironolactone (ALDACTONE) 25 MG tablet Take 1 tablet (25 mg total) by mouth daily. (Patient taking differently: Take 12.5 mg by mouth daily. )  . [DISCONTINUED] B-D UF III MINI PEN NEEDLES 31G X 5 MM MISC USE TO INJECT LANTUS DAILY  . [DISCONTINUED] cephALEXin (KEFLEX) 500 MG capsule Take 1 capsule (500 mg total) by mouth 3 (three) times daily.  . [DISCONTINUED] Esomeprazole Magnesium (NEXIUM PO) Take 1 capsule by mouth as needed (for indigestion).  . [DISCONTINUED] glucose blood (ONE TOUCH ULTRA TEST) test strip 2 each by Other route 2 (two) times daily. DX Code E11.9  . [DISCONTINUED] lisinopril (PRINIVIL,ZESTRIL) 20 MG tablet TAKE 1 TABLET BY MOUTH TWICE A DAY   No facility-administered encounter medications on file as of 01/14/2016.    Allergies as of 01/14/2016 - Review Complete 01/12/2016  Allergen Reaction Noted  . Lipitor [atorvastatin] Other (See Comments) 07/17/2012    Past Medical History  Diagnosis Date  . Hyperlipemia   . Hypertension   . Pancreatitis 2000's  . Hepatic lesion 02/04/11  . GERD (gastroesophageal reflux disease)   . CAD (coronary artery disease)     last cath by [facility] DR.  Mihai Croitoru showing  some  disease involving LCX and small size of Diag   . NSTEMI (non-ST elevated myocardial infarction) ([place]) 11/21/2009  . Shortness of breath   . CHF exacerbation, due to diastolic dysfunction 57/84/6962  . Ischemic cardiomyopathy     EF 35-40%  . Liver hemangioma   . Type II diabetes mellitus ([place])   . Chronic systolic CHF (congestive heart failure), NYHA class 1 ([place]) 07/17/2012    Past Surgical History  Procedure Laterality Date  . Incision and drainage abscess  anal  1970's  . Cardiac catheterization  2011    minimal disease, medical management  . Coronary angioplasty with stent placement  07/14/2012    successful PCI & stenting of mid LAD & PDA off the dominant CX  . Left heart catheterization with coronary angiogram N/A 07/14/2012    Procedure: LEFT HEART CATHETERIZATION WITH CORONARY ANGIOGRAM;  Surgeon: [name], MD;  Location: [facility] CATH LAB;  Service: Cardiovascular;  Laterality: N/A;  . Percutaneous coronary stent intervention (pci-s) Right 07/14/2012    Procedure: PERCUTANEOUS CORONARY STENT INTERVENTION (PCI-S);  Surgeon: [name], MD;  Location: [facility] CATH LAB;  Service: Cardiovascular;  Laterality: Right;    Family History  Problem Relation Age of Onset  . Diabetes Paternal Grandmother   . Diabetes Maternal Grandfather   . Colon cancer Neg Hx   . Diabetes Mother     Social History   Social History  . Marital Status: Single    Spouse Name: N/A  . Number of Children: 0  . Years of Education: N/A   Occupational History  . [employer]   .  [employer]   Social History Main Topics  . Smoking status: Never Smoker   . Smokeless tobacco: Never Used  . Alcohol Use: No  . Drug Use: No  . Sexual Activity: No   Other Topics Concern  . Not on file   Social History Narrative      Review of systems: Review of Systems  Constitutional: Negative for fever and chills.  HENT: Negative.   Eyes: Negative for blurred vision.  Respiratory: Negative for cough, shortness of breath and wheezing.   Cardiovascular: Negative for chest pain and palpitations.  Gastrointestinal: as per HPI Genitourinary: Negative for dysuria, urgency, frequency and hematuria.  Musculoskeletal: Negative for myalgias, back pain and joint pain.  Skin: Negative for itching and rash.  Neurological: Negative for dizziness, tremors, focal weakness, seizures and loss of consciousness.  Endo/Heme/Allergies: Negative for environmental allergies.    Psychiatric/Behavioral: Negative for depression, suicidal ideas and hallucinations.  All other systems reviewed and are negative.   Physical Exam: Filed Vitals:   01/14/16 1442  BP: 126/60  Pulse: 62   Gen:      No acute distress HEENT:  EOMI, sclera anicteric Neck:     No masses; no thyromegaly Lungs:    Clear to auscultation bilaterally; normal respiratory effort CV:         Regular rate and rhythm; no murmurs Abd:      + bowel sounds; soft, non-tender; no palpable masses, no distension Ext:    No edema; adequate peripheral perfusion Skin:      Warm and dry; no rash Neuro: alert and oriented x 3 Psych: normal mood and affect  Data Reviewed:  Chart and pertinent GI past workup in epic  Assessment and Plan/Recommendations:  70 year old male with idiopathic chronic pancreatitis, status post necrotizing pancreatitis in 1990 with pancreatic insufficiency and diabetes here for follow-up visit He has chronic pancreatic duct dilation,  no recent imaging in the past 5 years We'll obtain CT abdomen and pelvis with IV contrast He also has questionable esophageal varices, we will schedule for EGD for surveillance Continue Creon Return in 1 year  K. Denzil Magnuson , MD [phone] Mon-Fri 8a-5p [phone] after 5p, weekends, holidays

## 2016-01-16 ENCOUNTER — Inpatient Hospital Stay: Admission: RE | Admit: 2016-01-16 | Payer: 59 | Source: Ambulatory Visit

## 2016-01-16 ENCOUNTER — Encounter: Payer: Self-pay | Admitting: *Deleted

## 2016-01-16 ENCOUNTER — Telehealth: Payer: Self-pay

## 2016-01-16 NOTE — Telephone Encounter (Signed)
David Lane calls. Patient did not drink the contrast for his Lane this morning. States his blood sugar was "in the 60's ". He declines to reschedule. He soes not want to do the Lane until after the EGD which is scheduled for 01/20/16.

## 2016-01-16 NOTE — Telephone Encounter (Signed)
ok 

## 2016-01-19 ENCOUNTER — Ambulatory Visit (INDEPENDENT_AMBULATORY_CARE_PROVIDER_SITE_OTHER): Payer: 59 | Admitting: Cardiovascular Disease

## 2016-01-19 ENCOUNTER — Encounter: Payer: Self-pay | Admitting: Cardiovascular Disease

## 2016-01-19 VITALS — BP 122/68 | HR 78 | Ht 71.0 in | Wt 198.0 lb

## 2016-01-19 DIAGNOSIS — I5022 Chronic systolic (congestive) heart failure: Secondary | ICD-10-CM

## 2016-01-19 DIAGNOSIS — I10 Essential (primary) hypertension: Secondary | ICD-10-CM | POA: Diagnosis not present

## 2016-01-19 DIAGNOSIS — I255 Ischemic cardiomyopathy: Secondary | ICD-10-CM | POA: Diagnosis not present

## 2016-01-19 DIAGNOSIS — I2510 Atherosclerotic heart disease of native coronary artery without angina pectoris: Secondary | ICD-10-CM

## 2016-01-19 DIAGNOSIS — E785 Hyperlipidemia, unspecified: Secondary | ICD-10-CM

## 2016-01-19 DIAGNOSIS — K8689 Other specified diseases of pancreas: Secondary | ICD-10-CM

## 2016-01-19 DIAGNOSIS — E089 Diabetes mellitus due to underlying condition without complications: Secondary | ICD-10-CM

## 2016-01-19 MED ORDER — LISINOPRIL 20 MG PO TABS: 20.0000 mg | ORAL_TABLET | Freq: Two times a day (BID) | ORAL | Status: DC

## 2016-01-19 MED ORDER — NEBIVOLOL HCL 20 MG PO TABS: 20.0000 mg | ORAL_TABLET | Freq: Two times a day (BID) | ORAL | Status: DC

## 2016-01-19 NOTE — Telephone Encounter (Signed)
Message left on machine to call to schedule an appt. To start the V-go

## 2016-01-19 NOTE — Patient Instructions (Signed)
Dr Croitoru recommends that you schedule a follow-up appointment in 3 months.  If you need a refill on your cardiac medications before your next appointment, please call your pharmacy. 

## 2016-01-20 ENCOUNTER — Ambulatory Visit (AMBULATORY_SURGERY_CENTER): Payer: 59 | Admitting: Gastroenterology

## 2016-01-20 ENCOUNTER — Encounter: Payer: Self-pay | Admitting: Gastroenterology

## 2016-01-20 VITALS — BP 130/64 | HR 69 | Temp 97.5°F | Resp 16 | Ht 72.0 in | Wt 199.0 lb

## 2016-01-20 DIAGNOSIS — R1010 Upper abdominal pain, unspecified: Secondary | ICD-10-CM | POA: Diagnosis not present

## 2016-01-20 DIAGNOSIS — K861 Other chronic pancreatitis: Secondary | ICD-10-CM

## 2016-01-20 LAB — GLUCOSE, CAPILLARY
Glucose-Capillary: 153 mg/dL — ABNORMAL HIGH (ref 65–99)
Glucose-Capillary: 139 mg/dL — ABNORMAL HIGH (ref 65–99)

## 2016-01-20 MED ORDER — SODIUM CHLORIDE 0.9 % IV SOLN: 500.0000 mL | INTRAVENOUS | Status: DC

## 2016-01-20 NOTE — Op Note (Signed)
[facility] Patient Name: Muse Brincefield Procedure Date: 01/20/2016 2:09 PM MRN: [id number] Endoscopist: Mauri Pole , MD Age: 70 Date of Birth: 09/11/1946 Gender: Male Procedure:                Upper GI endoscopy Indications:              Exclusion of esophageal varices in patient with                            suspected portal hypertension, Upper abdominal                            pain, Dyspepsia Medicines:                Monitored Anesthesia Care Procedure:                Pre-Anesthesia Assessment:                           - Prior to the procedure, a History and Physical                            was performed, and patient medications and                            allergies were reviewed. The patient's tolerance of                            previous anesthesia was also reviewed. The risks                            and benefits of the procedure and the sedation                            options and risks were discussed with the patient.                            All questions were answered, and informed consent                            was obtained. Prior Anticoagulants: The patient has                            taken no previous anticoagulant or antiplatelet                            agents. ASA Grade Assessment: II - A patient with                            mild systemic disease. After reviewing the risks                            and benefits, the patient was deemed in  satisfactory condition to undergo the procedure.                           After obtaining informed consent, the endoscope was                            passed under direct vision. Throughout the                            procedure, the patient's blood pressure, pulse, and                            oxygen saturations were monitored continuously. The                            Model GIF-[id number]) scope was introduced   through the mouth, and advanced to the second part                            of duodenum. The upper GI endoscopy was                            accomplished without difficulty. The patient                            tolerated the procedure well. Scope In: Scope Out: Findings:                 The esophagus was normal. No evidence of esophageal                            varices                           A small amount of a phytobezoar was found in the                            gastric body, along with 200-300cc of clear fluid                            that was sunctioned                           The stomach was otherwise normal with no evidence                            of gastric varices                           The examined duodenum was normal. Complications:            No immediate complications. Estimated Blood Loss:     Estimated blood loss was minimal. Impression:               - Normal esophagus.                           -  A small amount of a phytobezoar in the stomach.                           - Normal stomach.                           - Normal examined duodenum.                           - No specimens collected. Recommendation:           - Patient has a contact number available for                            emergencies. The signs and symptoms of potential                            delayed complications were discussed with the                            patient. Return to normal activities tomorrow.                            Written discharge instructions were provided to the                            patient.                           - Low fiber diet indefinitely.                           - Continue present medications.                           - No repeat upper endoscopy.                           - Return to GI office PRN.                           - Patient has a contact number available for                            emergencies. The signs and symptoms of  potential                            delayed complications were discussed with the                            patient. Return to normal activities tomorrow.                            Written discharge instructions were provided to the                            patient.  Mauri Pole, MD 01/20/2016 2:43:09 PM This report has been signed electronically.

## 2016-01-20 NOTE — Patient Instructions (Addendum)
YOU HAD AN ENDOSCOPIC PROCEDURE TODAY AT [facility]:   Refer to the procedure report that was given to you for any specific questions about what was found during the examination.  If the procedure report does not answer your questions, please call your gastroenterologist to clarify.  If you requested that your care partner not be given the details of your procedure findings, then the procedure report has been included in a sealed envelope for you to review at your convenience later.  YOU SHOULD EXPECT: Some feelings of bloating in the abdomen. Passage of more gas than usual.  Walking can help get rid of the air that was put into your GI tract during the procedure and reduce the bloating. If you had a lower endoscopy (such as a colonoscopy or flexible sigmoidoscopy) you may notice spotting of blood in your stool or on the toilet paper. If you underwent a bowel prep for your procedure, you may not have a normal bowel movement for a few days.  Please Note:  You might notice some irritation and congestion in your nose or some drainage.  This is from the oxygen used during your procedure.  There is no need for concern and it should clear up in a day or so.  SYMPTOMS TO REPORT IMMEDIATELY:    Following upper endoscopy (EGD)  Vomiting of blood or coffee ground material  New chest pain or pain under the shoulder blades  Painful or persistently difficult swallowing  New shortness of breath  Fever of 100F or higher  Black, tarry-looking stools  For urgent or emergent issues, a gastroenterologist can be reached at any hour by calling [phone].   DIET: Your first meal following the procedure should be a small meal and then it is ok to progress to your normal diet. Heavy or fried foods are harder to digest and may make you feel nauseous or bloated.  Likewise, meals heavy in dairy and vegetables can increase bloating.  Drink plenty of fluids but you should avoid alcoholic beverages  for 24 hours.  ACTIVITY:  You should plan to take it easy for the rest of today and you should NOT DRIVE or use heavy machinery until tomorrow (because of the sedation medicines used during the test).    FOLLOW UP: Our staff will call the number listed on your records the next business day following your procedure to check on you and address any questions or concerns that you may have regarding the information given to you following your procedure. If we do not reach you, we will leave a message.  However, if you are feeling well and you are not experiencing any problems, there is no need to return our call.  We will assume that you have returned to your regular daily activities without incident.  If any biopsies were taken you will be contacted by phone or by letter within the next 1-3 weeks.  Please call us at [phone] if you have not heard about the biopsies in 3 weeks.    SIGNATURES/CONFIDENTIALITY: You and/or your care partner have signed paperwork which will be entered into your electronic medical record.  These signatures attest to the fact that that the information above on your After Visit Summary has been reviewed and is understood.  Full responsibility of the confidentiality of this discharge information lies with you and/or your care-partner.   Resume medications and follow low fiber diet. Information given on Gastroparesis Diet.

## 2016-01-20 NOTE — Progress Notes (Signed)
To recovery, report to Brown, RN, VSS. 

## 2016-01-20 NOTE — Progress Notes (Signed)
Patient ID: David Lane, male   DOB: October 12, 1945, 70 y.o.   MRN: [id number] Patient ID: David Lane, male   DOB: 1946/03/21, 70 y.o.   MRN: [id number]    Cardiology Office Note    Date:  01/20/2016   ID:  David Lane, David Lane June 13, 1946, MRN [id number]  PCP:  [name], MD  Cardiologist:   [name], MD   Chief Complaint  Patient presents with  . 3 MONTHS    NO COMPLAINTS.    History of Present Illness:  David Lane is a 70 y.o. male with severe systemic hypertension and mild ischemic cardiomyopathy previously complicated by heart failure exacerbation. He has brittle diabetes mellitus following an episode of severe pancreatitis that caused both endocrine and exocrine pancreatic insufficiency.  David Lane has decided to retire. For the last several weeks he has using up his sick leave days. He is going to have a retirement party  This Friday. He has noticed that blood pressure control has been much smoother, since he does not go to work. over the last 2 weeks, BP has generally been 116-130/70-80. Only on one occasion blood pressure was 163/86.  He continues to have difficulty maintaining good glycemic control. He has had both hypo-and hyperglycemic events. This may be in part related to unpredictable food absorption in the setting of pancreatic insufficiency, as well as the fact that he is truly insulin deficient. He is considering an insulin pump.   Past Medical History  Diagnosis Date  . Hyperlipemia   . Hypertension   . Pancreatitis 2000's  . Hepatic lesion 02/04/11  . GERD (gastroesophageal reflux disease)   . CAD (coronary artery disease)     last cath by [facility] DR.  Aadit Lane showing  some  disease involving LCX and small size of Diag   . NSTEMI (non-ST elevated myocardial infarction) ([place]) 11/21/2009  . Shortness of breath   . CHF exacerbation, due to diastolic dysfunction 81/82/9937  . Ischemic cardiomyopathy     EF 35-40%  . Liver hemangioma   .  Type II diabetes mellitus ([place])   . Chronic systolic CHF (congestive heart failure), NYHA class 1 ([place]) 07/17/2012    Past Surgical History  Procedure Laterality Date  . Incision and drainage abscess anal  1970's  . Cardiac catheterization  2011    minimal disease, medical management  . Coronary angioplasty with stent placement  07/14/2012    successful PCI & stenting of mid LAD & PDA off the dominant CX  . Left heart catheterization with coronary angiogram N/A 07/14/2012    Procedure: LEFT HEART CATHETERIZATION WITH CORONARY ANGIOGRAM;  Surgeon: [name], MD;  Location: [facility] CATH LAB;  Service: Cardiovascular;  Laterality: N/A;  . Percutaneous coronary stent intervention (pci-s) Right 07/14/2012    Procedure: PERCUTANEOUS CORONARY STENT INTERVENTION (PCI-S);  Surgeon: [name], MD;  Location: [facility] CATH LAB;  Service: Cardiovascular;  Laterality: Right;    Outpatient Prescriptions Prior to Visit  Medication Sig Dispense Refill  . amLODipine (NORVASC) 5 MG tablet TAKE 2 TABLETS BY MOUTH EVERY DAY 180 tablet 2  . aspirin 81 MG tablet Take 81 mg by mouth daily.      Marland Kitchen glucose blood (ONE TOUCH ULTRA TEST) test strip 1 each by Other route 4 (four) times daily. And lancets 4/day 120 each 12  . insulin aspart (NOVOLOG) 100 UNIT/ML injection For use in pump, for a total of 60 units per day 20 mL 11  . Insulin Disposable Pump (V-GO  20) KIT 1 Device by Does not apply route daily. 30 kit 11  . lipase/protease/amylase (CREON) 36000 UNITS CPEP capsule Take 3 capsules (108,000 Units total) by mouth 3 (three) times daily with meals. And snacks 270 capsule 0  . Multiple Vitamin (MULTIVITAMIN) capsule Take 1 capsule by mouth daily. HOLD UNTIL ABLE TO SWALLOW PILLS WHOLE    . spironolactone (ALDACTONE) 50 MG tablet TAKE 1 TABLET (50 MG TOTAL) BY MOUTH DAILY. 90 tablet 0  . simvastatin (ZOCOR) 20 MG tablet Take 1 tablet (20 mg total) by mouth at bedtime. HOLD UNTIL ABLE TO SWALLOW PILLS WHOLE 90  tablet 1  . Nebivolol HCl 20 MG TABS Take 1 tablet (20 mg total) by mouth daily. 90 tablet 1   No facility-administered medications prior to visit.     Allergies:   Lipitor   Social History   Social History  . Marital Status: Single    Spouse Name: N/A  . Number of Children: 0  . Years of Education: N/A   Occupational History  . [employer]   .  [employer]   Social History Main Topics  . Smoking status: Never Smoker   . Smokeless tobacco: Never Used  . Alcohol Use: No  . Drug Use: No  . Sexual Activity: No   Other Topics Concern  . None   Social History Narrative     Family History:  The patient's family history includes Diabetes in his maternal grandfather, mother, and paternal grandmother. There is no history of Colon cancer.   ROS:   Please see the history of present illness.    ROS All other systems reviewed and are negative.   PHYSICAL EXAM:   VS:  BP 122/68 mmHg  Pulse 78  Ht 5' 11"  (1.803 m)  Wt 89.812 kg (198 lb)  BMI 27.63 kg/m2   GEN: Well nourished, well developed, in no acute distress HEENT: normal Neck: no JVD, carotid bruits, or masses Cardiac: RRR; no murmurs, rubs, or gallops,no edema  Respiratory:  clear to auscultation bilaterally, normal work of breathing GI: soft, nontender, nondistended, + BS MS: no deformity or atrophy Skin: warm and dry, no rash Neuro:  Alert and Oriented x 3, Strength and sensation are intact Psych: euthymic mood, full affect  Wt Readings from Last 3 Encounters:  01/20/16 90.266 kg (199 lb)  01/19/16 89.812 kg (198 lb)  01/14/16 90.266 kg (199 lb)      Studies/Labs Reviewed:   EKG:  EKG is ordered today.  It shows sinus rhythm, ,first-degree AV block, anteroseptal Q waves, no acute repolarization abnormaliti  Recent Labs: 01/14/2016: BUN 22; Creatinine, Ser 0.99; Potassium 4.5; Sodium 140   Lipid Panel    Component Value Date/Time   CHOL 82 11/25/2014 1339   TRIG 49.0 11/25/2014 1339   HDL 29.60*  11/25/2014 1339   CHOLHDL 3 11/25/2014 1339   VLDL 9.8 11/25/2014 1339   LDLCALC 43 11/25/2014 1339    ASSESSMENT:    1. Chronic systolic CHF (congestive heart failure), NYHA class 1 ([place])   2. Ischemic cardiomyopathy, EF 50% by echo 08/01/13   3. Essential hypertension   4. Coronary artery disease involving native coronary artery of native heart without angina pectoris   5. Hyperlipidemia   6. 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 Pancreatic insufficiency (H[facility])      PLAN:  In order of problems listed above:  1. CHF: NYHA class I, euvolemic. Does not require loop diuretics. On ACE  inhibitor,  Beta blocker, spironolactone. 2. Isch CMP: Most recent assessment of ejection fraction showed improvement with EF 50%  3. HTN: I believe we have finally identified a good blood pressure regimen for [place]. Not working seems to have some of the fluctuations that he experienced. 4. CAD: Angina free despite active lifestyle  5. HLP: Excellent lipid profile  6. 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 hhe is thinking about an insulin pump. 7. Pancreatic insufficiency might be contributing to less predictable exertion of nutrients and medications; this may explain some of the volatility and glycemic control, volume status and blood pressure.  Serjio is very anxious. He requires constant reassurance. Will keep his appointments on at least an every three-month basis.  Medication Adjustments/Labs and Tests Ordered: Current medicines are reviewed at length with the patient today.  Concerns regarding medicines are outlined above.  Medication changes, Labs and Tests ordered today are listed in the Patient Instructions below. Patient Instructions  Dr Sallyanne Kuster recommends that you schedule a follow-up appointment in 3 months.  If you need a refill on your cardiac medications before your next appointment, please call your  pharmacy.    Mikael Spray, MD  01/20/2016 5:45 PM    [address], [place], [place]  [address] Phone: [phone]; Fax: [phone]

## 2016-01-21 ENCOUNTER — Telehealth: Payer: Self-pay | Admitting: *Deleted

## 2016-01-21 NOTE — Telephone Encounter (Signed)
  Follow up Call-  Call back number 01/20/2016  Post procedure Call Back phone  # [phone]  Permission to leave phone message Yes     Patient questions:  Do you have a fever, pain , or abdominal swelling? No. Pain Score  0 *  Have you tolerated food without any problems? Yes.    Have you been able to return to your normal activities? Yes.    Do you have any questions about your discharge instructions: Diet   No. Medications  No. Follow up visit  No.  Do you have questions or concerns about your Care? No.  Actions: * If pain score is 4 or above: No action needed, pain <4.

## 2016-01-23 ENCOUNTER — Telehealth: Payer: Self-pay | Admitting: Endocrinology

## 2016-01-23 MED ORDER — BASAGLAR KWIKPEN 100 UNIT/ML ~~LOC~~ SOPN: 65.0000 [IU] | PEN_INJECTOR | SUBCUTANEOUS | Status: DC

## 2016-01-23 NOTE — Telephone Encounter (Signed)
I contacted the pt and advised of note below. Pt stated at his last office visit with [place] and [employer]. He was advised to wait until he retires from [employer] to start the V-go. Pt has not met with a Vaughan Basta yet about the V-go and does not know how to use the V-go. Pt would like for [place] to prescribe the Basaglar again until he gets his new insurance after he retires. Please advise, Thanks!

## 2016-01-23 NOTE — Telephone Encounter (Signed)
This med was d/c'ed F/u ov is due

## 2016-01-23 NOTE — Telephone Encounter (Signed)
See note below and please advise if ok to refill. This medication is not listed under the current med list.  Thanks!

## 2016-01-23 NOTE — Telephone Encounter (Signed)
Patient need a refill  of medication  Basaglar 90 supply send to  [id number] - [place], [place] [phone] (Phone) [phone] (Fax)      He need it today please, he asks call him and let him know when it is ready.

## 2016-01-23 NOTE — Telephone Encounter (Signed)
Rx submitted and pt notified.  

## 2016-01-23 NOTE — Telephone Encounter (Signed)
Ok, please refill the basaglar prn

## 2016-01-27 NOTE — Addendum Note (Signed)
Addended by: Therisa Doyne on: 01/27/2016 05:26 PM   Modules accepted: Orders

## 2016-03-03 ENCOUNTER — Other Ambulatory Visit: Payer: Self-pay | Admitting: Cardiovascular Disease

## 2016-03-27 ENCOUNTER — Other Ambulatory Visit: Payer: Self-pay | Admitting: Gastroenterology

## 2016-03-27 ENCOUNTER — Other Ambulatory Visit: Payer: Self-pay | Admitting: Cardiovascular Disease

## 2016-04-15 ENCOUNTER — Ambulatory Visit (INDEPENDENT_AMBULATORY_CARE_PROVIDER_SITE_OTHER): Payer: Medicare Other | Admitting: Cardiovascular Disease

## 2016-04-15 ENCOUNTER — Encounter: Payer: Self-pay | Admitting: Cardiovascular Disease

## 2016-04-15 VITALS — BP 101/64 | HR 84 | Ht 71.0 in | Wt 193.6 lb

## 2016-04-15 DIAGNOSIS — E785 Hyperlipidemia, unspecified: Secondary | ICD-10-CM

## 2016-04-15 DIAGNOSIS — I5022 Chronic systolic (congestive) heart failure: Secondary | ICD-10-CM | POA: Diagnosis not present

## 2016-04-15 DIAGNOSIS — Z79899 Other long term (current) drug therapy: Secondary | ICD-10-CM

## 2016-04-15 DIAGNOSIS — I10 Essential (primary) hypertension: Secondary | ICD-10-CM

## 2016-04-15 DIAGNOSIS — I255 Ischemic cardiomyopathy: Secondary | ICD-10-CM

## 2016-04-15 DIAGNOSIS — I2510 Atherosclerotic heart disease of native coronary artery without angina pectoris: Secondary | ICD-10-CM

## 2016-04-15 DIAGNOSIS — E089 Diabetes mellitus due to underlying condition without complications: Secondary | ICD-10-CM

## 2016-04-15 DIAGNOSIS — K8689 Other specified diseases of pancreas: Secondary | ICD-10-CM

## 2016-04-15 NOTE — Patient Instructions (Signed)
Medication Instructions: DO NOT SKIP BYSTOLIC. IF YOUR BLOOD PRESSURE IS LOW, YOU MAY HOLD LISINOPRIL AND/OR AMLODIPINE, BUT DO NOT SKIP BYSTOLIC.  Labwork: Your physician recommends that you return for lab work at your earliest [place].  Testing/Procedures: NONE ORDERED  Follow-up: Dr Sallyanne Kuster recommends that you schedule a follow-up appointment in 3 months.  If you need a refill on your cardiac medications before your next appointment, please call your pharmacy.

## 2016-04-15 NOTE — Progress Notes (Signed)
Patient ID: David Lane, male   DOB: 29-Feb-1946, 70 y.o.   MRN: [id number] Patient ID: David Lane, male   DOB: June 18, 1946, 70 y.o.   MRN: [id number]    Cardiology Office Note    Date:  04/16/2016   ID:  David Lane, Dal[place] [birth year], MRN [id number]  PCP:  [name], MD  Cardiologist:   [name], MD   chief complaint: BP management.   History of Present Illness:  David Lane is a 70 y.o. male with severe systemic hypertension and mild ischemic cardiomyopathy previously complicated by heart failure exacerbation. He has brittle diabetes mellitus following an episode of severe pancreatitis that caused both endocrine and exocrine pancreatic insufficiency.  David Lane has retired and no longer has the stress of his job, but continues to be anxious about his health. He checks his blood pressure several times a day, and despite my advice has been skipping one or 2 of his beta blocker doses every so often. He is concerned that if he takes these medications his blood pressure will get too low. Have previously discussed the risk of rebound hypertension with beta blocker interruption.  Spironolactone is listed on his medication list, but he reports that he is not taking it.  He continues to have difficulty maintaining good glycemic control. He has had both hypo-and hyperglycemic events. This may be in part related to unpredictable food absorption in the setting of pancreatic insufficiency, as well as the fact that he is truly insulin deficient.    Past Medical History  Diagnosis Date  . Hyperlipemia   . Hypertension   . Pancreatitis 2000's  . Hepatic lesion 02/04/11  . GERD (gastroesophageal reflux disease)   . CAD (coronary artery disease)     last cath by [facility] DR.  Aleks Lane showing  some  disease involving LCX and small size of Diag   . NSTEMI (non-ST elevated myocardial infarction) ([name]) 11/21/2009  . Shortness of breath   . CHF exacerbation, due to diastolic  dysfunction 30/03/6225  . Ischemic cardiomyopathy     EF 35-40%  . Liver hemangioma   . Type II diabetes mellitus ([place])   . Chronic systolic CHF (congestive heart failure), NYHA class 1 ([name]) 07/17/2012    Past Surgical History  Procedure Laterality Date  . Incision and drainage abscess anal  1970's  . Cardiac catheterization  2011    minimal disease, medical management  . Coronary angioplasty with stent placement  07/14/2012    successful PCI & stenting of mid LAD & PDA off the dominant CX  . Left heart catheterization with coronary angiogram N/A 07/14/2012    Procedure: LEFT HEART CATHETERIZATION WITH CORONARY ANGIOGRAM;  Surgeon: [name], MD;  Location: [facility] CATH LAB;  Service: Cardiovascular;  Laterality: N/A;  . Percutaneous coronary stent intervention (pci-s) Right 07/14/2012    Procedure: PERCUTANEOUS CORONARY STENT INTERVENTION (PCI-S);  Surgeon: [name], MD;  Location: [facility] CATH LAB;  Service: Cardiovascular;  Laterality: Right;    Outpatient Prescriptions Prior to Visit  Medication Sig Dispense Refill  . amLODipine (NORVASC) 5 MG tablet TAKE 2 TABLETS BY MOUTH EVERY DAY 180 tablet 2  . aspirin 81 MG tablet Take 81 mg by mouth daily.      Marland Kitchen CREON 36000 units CPEP capsule TAKE 3 CAPSULES (108,000 UNITS TOTAL) BY MOUTH 3 (THREE) TIMES DAILY WITH MEALS. AND SNACKS 810 capsule 0  . glucose blood (ONE TOUCH ULTRA TEST) test strip 1 each by Other route 4 (four)  times daily. And lancets 4/day 120 each 12  . insulin aspart (NOVOLOG) 100 UNIT/ML injection For use in pump, for a total of 60 units per day 20 mL 11  . Insulin Glargine (BASAGLAR KWIKPEN) 100 UNIT/ML SOPN Inject 0.65 mLs (65 Units total) into the skin every morning. And pen needles 1/day 60 mL 0  . lisinopril (PRINIVIL,ZESTRIL) 20 MG tablet Take 1 tablet (20 mg total) by mouth 2 (two) times daily. 180 tablet 3  . Multiple Vitamin (MULTIVITAMIN) capsule Take 1 capsule by mouth daily. HOLD UNTIL ABLE TO SWALLOW PILLS  WHOLE    . simvastatin (ZOCOR) 40 MG tablet TAKE 1 TABLET BY MOUTH AT BEDTIME 90 tablet 2  . spironolactone (ALDACTONE) 50 MG tablet TAKE 1 TABLET BY MOUTH EVERY DAY 90 tablet 0  . Insulin Disposable Pump (V-GO 20) KIT 1 Device by Does not apply route daily. 30 kit 11  . Nebivolol HCl (BYSTOLIC) 20 MG TABS Take 1 tablet (20 mg total) by mouth 2 (two) times daily. 180 tablet 3   No facility-administered medications prior to visit.     Allergies:   Lipitor   Social History   Social History  . Marital Status: Single    Spouse Name: N/A  . Number of Children: 0  . Years of Education: N/A   Occupational History  . [employer]   .  [employer]   Social History Main Topics  . Smoking status: Never Smoker   . Smokeless tobacco: Never Used  . Alcohol Use: No  . Drug Use: No  . Sexual Activity: No   Other Topics Concern  . None   Social History Narrative     Family History:  The patient's family history includes Diabetes in his maternal grandfather, mother, and paternal grandmother. There is no history of Colon cancer.   ROS:   Please see the history of present illness.    ROS All other systems reviewed and are negative.   PHYSICAL EXAM:   VS:  BP 101/64 mmHg  Pulse 84  Ht 5' 11"  (1.803 m)  Wt 87.816 kg (193 lb 9.6 oz)  BMI 27.01 kg/m2   GEN: Well nourished, well developed, in no acute distress HEENT: normal Neck: no JVD, carotid bruits, or masses Cardiac: RRR; no murmurs, rubs, or gallops,no edema  Respiratory:  clear to auscultation bilaterally, normal work of breathing GI: soft, nontender, nondistended, + BS MS: no deformity or atrophy Skin: warm and dry, no rash Neuro:  Alert and Oriented x 3, Strength and sensation are intact Psych: euthymic mood, full affect  Wt Readings from Last 3 Encounters:  04/15/16 87.816 kg (193 lb 9.6 oz)  01/20/16 90.266 kg (199 lb)  01/19/16 89.812 kg (198 lb)      Studies/Labs Reviewed:   EKG:  EKG is not ordered today.   Recent Labs: 01/14/2016: BUN 22; Creatinine, Ser 0.99; Potassium 4.5; Sodium 140   Lipid Panel    Component Value Date/Time   CHOL 82 11/25/2014 1339   TRIG 49.0 11/25/2014 1339   HDL 29.60* 11/25/2014 1339   CHOLHDL 3 11/25/2014 1339   VLDL 9.8 11/25/2014 1339   LDLCALC 43 11/25/2014 1339    ASSESSMENT:    1. Chronic systolic CHF (congestive heart failure), NYHA class 1 ([place])   2. Ischemic cardiomyopathy, EF 50% by echo 08/01/13   3. Essential hypertension   4. Coronary artery disease involving native coronary artery of native heart without angina pectoris   5. Dyslipidemia  6. 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 Medication management      PLAN:  In order of problems listed above:  1. CHF: NYHA class I, euvolemic. Does not require loop diuretics. On ACE inhibitor,  Beta blocker, spironolactone. 2. Isch CMP: Most recent assessment of ejection fraction showed improvement with EF 50%  3. HTN: I Hoped that his retirement would lead to less preoccupation with his blood pressure but he continues to fiddle with his medications all the time. I reinforced the fact that it is dangerous to reduce or skip doses of beta blocker. If he insists on adjusting his medication himself, it is better that he choose to skip doses of the ACE inhibitor or amlodipine. However, I have reassured him that his blood pressure control appears to be quite smooth and it is likely not necessary for him to miss doses of his medications 4. CAD: Angina free despite active lifestyle  5. HLP: Excellent lipid profile  6. 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 hhe is thinking about an insulin pump. 7. Pancreatic insufficiency might be contributing to less predictable exertion of nutrients and medications; this may explain some of the volatility and glycemic control, volume status and blood pressure.  Flemon is very anxious. He  requires constant reassurance. Will keep his appointments on at least an every three-month basis.  Medication Adjustments/Labs and Tests Ordered: Current medicines are reviewed at length with the patient today.  Concerns regarding medicines are outlined above.  Medication changes, Labs and Tests ordered today are listed in the Patient Instructions below. Patient Instructions  Medication Instructions: DO NOT SKIP BYSTOLIC. IF YOUR BLOOD PRESSURE IS LOW, YOU MAY HOLD LISINOPRIL AND/OR AMLODIPINE, BUT DO NOT SKIP BYSTOLIC.  Labwork: Your physician recommends that you return for lab work at your earliest [place].  Testing/Procedures: NONE ORDERED  Follow-up: Dr Sallyanne Kuster recommends that you schedule a follow-up appointment in 3 months.  If you need a refill on your cardiac medications before your next appointment, please call your pharmacy.    Signed, [name], MD  04/16/2016 6:33 PM    [address], [place], [place]  [id number] Phone: [phone]; Fax: [phone]

## 2016-04-16 ENCOUNTER — Other Ambulatory Visit: Payer: Self-pay | Admitting: Cardiovascular Disease

## 2016-04-16 NOTE — Telephone Encounter (Signed)
REFILL 

## 2016-04-20 ENCOUNTER — Telehealth: Payer: Self-pay | Admitting: Endocrinology

## 2016-04-21 ENCOUNTER — Ambulatory Visit (INDEPENDENT_AMBULATORY_CARE_PROVIDER_SITE_OTHER): Payer: Medicare Other | Admitting: Endocrinology

## 2016-04-21 ENCOUNTER — Encounter: Payer: Self-pay | Admitting: Endocrinology

## 2016-04-21 VITALS — BP 100/70 | HR 82 | Ht 71.0 in | Wt 191.0 lb

## 2016-04-21 DIAGNOSIS — E089 Diabetes mellitus due to underlying condition without complications: Secondary | ICD-10-CM

## 2016-04-21 DIAGNOSIS — K8689 Other specified diseases of pancreas: Secondary | ICD-10-CM

## 2016-04-21 DIAGNOSIS — L97509 Non-pressure chronic ulcer of other part of unspecified foot with unspecified severity: Secondary | ICD-10-CM | POA: Insufficient documentation

## 2016-04-21 DIAGNOSIS — E785 Hyperlipidemia, unspecified: Secondary | ICD-10-CM | POA: Diagnosis not present

## 2016-04-21 DIAGNOSIS — L97511 Non-pressure chronic ulcer of other part of right foot limited to breakdown of skin: Secondary | ICD-10-CM | POA: Diagnosis not present

## 2016-04-21 LAB — COMPREHENSIVE METABOLIC PANEL
ALBUMIN: 4.1 g/dL (ref 3.5–5.2)
ALT: 18 U/L (ref 0–53)
AST: 16 U/L (ref 0–37)
Alkaline Phosphatase: 70 U/L (ref 39–117)
BUN: 28 mg/dL — ABNORMAL HIGH (ref 6–23)
CALCIUM: 9.3 mg/dL (ref 8.4–10.5)
CHLORIDE: 103 meq/L (ref 96–112)
CO2: 27 mEq/L (ref 19–32)
CREATININE: 1.27 mg/dL (ref 0.40–1.50)
GFR: 60.01 mL/min (ref >60.00)
Glucose, Bld: 220 mg/dL — ABNORMAL HIGH (ref 70–99)
POTASSIUM: 4 meq/L (ref 3.5–5.1)
Sodium: 137 mEq/L (ref 135–145)
Total Bilirubin: 0.6 mg/dL (ref 0.2–1.2)
Total Protein: 7.5 g/dL (ref 6.0–8.3)

## 2016-04-21 LAB — LIPID PANEL
CHOLESTEROL: 113 mg/dL (ref 0–200)
HDL: 27.1 mg/dL — AB (ref >39.00)
LDL CALC: 57 mg/dL (ref 0–99)
NonHDL: 85.71
TRIGLYCERIDES: 143 mg/dL (ref 0.0–149.0)
Total CHOL/HDL Ratio: 4
VLDL: 28.6 mg/dL (ref 0.0–40.0)

## 2016-04-21 LAB — POCT GLYCOSYLATED HEMOGLOBIN (HGB A1C): Hemoglobin A1C: 9.1

## 2016-04-21 MED ORDER — INSULIN GLARGINE 100 UNIT/ML SOLOSTAR PEN: 75.0000 [IU] | PEN_INJECTOR | SUBCUTANEOUS | 99 refills | Status: DC

## 2016-04-21 NOTE — Patient Instructions (Addendum)
Please see a wound care specialist.  you will receive a phone call, about a day and time for an appointment   blood tests are requested for you today.  We'll let you know about the results. Please take lantus each morning: 65 units if you are going to be active, and 75 if not. Please come back for a follow-up appointment in 3 months  check your blood sugar 4 times a day: before the 3 meals, and at bedtime.  also check if you have symptoms of your blood sugar being too high or too low.  please keep a record of the readings and bring it to your next appointment here (or you can bring the meter itself).  You can write it on any piece of paper.  please call us sooner if your blood sugar goes below 70, or if you have a lot of readings over 200.

## 2016-04-21 NOTE — Progress Notes (Signed)
Subjective:    Patient ID: David Lane, male    DOB: [birth year], 70 y.o.   MRN: [id number]  HPI Pt returns for f/u of diabetes mellitus: DM type: due to pancreatic insufficiency Dx'ed: 1988, during an episode of pancreatitis.   Complications: CAD, retinopathy, and polyneuropathy.  Therapy: insulin since 1990 DKA: never.   Severe hypoglycemia: last episode was in 2014; he says this was probably due to taking more than the prescribed insulin dosage.  Other: in December of 2014, he was changed to qd lantus, for further simplification of his insulin schedule.  V-GO pump was considered, but pt did not keep appt for it.  Interval history: He takes insulin as rx'ed.  He brings a record of his cbg's which I have reviewed today.  It varies from 91-300.  He denies hypoglycemia.   Past Medical History:  Diagnosis Date  . CAD (coronary artery disease)    last cath by [facility] DR.  Mihai Croitoru showing  some  disease involving LCX and small size of Diag   . CHF exacerbation, due to diastolic dysfunction [id number]  . Chronic systolic CHF (congestive heart failure), NYHA class 1 ([place]) 07/17/2012  . GERD (gastroesophageal reflux disease)   . Hepatic lesion 02/04/11  . Hyperlipemia   . Hypertension   . Ischemic cardiomyopathy    EF 35-40%  . Liver hemangioma   . NSTEMI (non-ST elevated myocardial infarction) ([place]) 11/21/2009  . Pancreatitis 2000's  . Shortness of breath   . Type II diabetes mellitus ([place])     Past Surgical History:  Procedure Laterality Date  . CARDIAC CATHETERIZATION  2011   minimal disease, medical management  . CORONARY ANGIOPLASTY WITH STENT PLACEMENT  07/14/2012   successful PCI & stenting of mid LAD & PDA off the dominant CX  . INCISION AND DRAINAGE ABSCESS ANAL  1970's  . LEFT HEART CATHETERIZATION WITH CORONARY ANGIOGRAM N/A 07/14/2012   Procedure: LEFT HEART CATHETERIZATION WITH CORONARY ANGIOGRAM;  Surgeon: [name], MD;  Location: [facility] CATH LAB;  Service:  Cardiovascular;  Laterality: N/A;  . PERCUTANEOUS CORONARY STENT INTERVENTION (PCI-S) Right 07/14/2012   Procedure: PERCUTANEOUS CORONARY STENT INTERVENTION (PCI-S);  Surgeon: [name], MD;  Location: [facility] CATH LAB;  Service: Cardiovascular;  Laterality: Right;    Social History   Social History  . Marital status: Single    Spouse name: N/A  . Number of children: 0  . Years of education: N/A   Occupational History  . [employer]   .  [employer]   Social History Main Topics  . Smoking status: Never Smoker  . Smokeless tobacco: Never Used  . Alcohol use No  . Drug use: No  . Sexual activity: No   Other Topics Concern  . Not on file   Social History Narrative  . No narrative on file    Current Outpatient Prescriptions on File Prior to Visit  Medication Sig Dispense Refill  . amLODipine (NORVASC) 5 MG tablet TAKE 2 TABLETS BY MOUTH EVERY DAY 180 tablet 2  . aspirin 81 MG tablet Take 81 mg by mouth daily.      Marland Kitchen CREON 36000 units CPEP capsule TAKE 3 CAPSULES (108,000 UNITS TOTAL) BY MOUTH 3 (THREE) TIMES DAILY WITH MEALS. AND SNACKS 810 capsule 0  . glucose blood (ONE TOUCH ULTRA TEST) test strip 1 each by Other route 4 (four) times daily. And lancets 4/day 120 each 12  . lisinopril (PRINIVIL,ZESTRIL) 20 MG tablet Take 1 tablet (20 mg  total) by mouth 2 (two) times daily. 180 tablet 3  . Multiple Vitamin (MULTIVITAMIN) capsule Take 1 capsule by mouth daily. HOLD UNTIL ABLE TO SWALLOW PILLS WHOLE    . Nebivolol HCl (BYSTOLIC) 20 MG TABS Take by mouth.    Marland Kitchen omeprazole (PRILOSEC) 20 MG capsule Take 20 mg by mouth daily.    . simvastatin (ZOCOR) 40 MG tablet TAKE 1 TABLET BY MOUTH AT BEDTIME 90 tablet 2  . spironolactone (ALDACTONE) 50 MG tablet TAKE 1 TABLET BY MOUTH EVERY DAY 90 tablet 0   No current facility-administered medications on file prior to visit.     Allergies  Allergen Reactions  . Lipitor [Atorvastatin] Other (See Comments)    Muscle weakness     Family History  Problem Relation Age of Onset  . Diabetes Paternal Grandmother   . Diabetes Maternal Grandfather   . Colon cancer Neg Hx   . Diabetes Mother     BP 100/70   Pulse 82   Ht 5\' 11"  (1.803 m)   Wt 191 lb (86.6 kg)   BMI 26.64 kg/m   Review of Systems Denies fever.  Right foot ulcer persists.      Objective:   Physical Exam VITAL SIGNS:  See vs page GENERAL: no distress Pulses: dorsalis pedis intact bilat.   MSK: no deformity of the feet CV: no leg edema Skin:  There is a 2x3 cm shallow ulcer at the plantar aspect of the right foot.  There is a 2 cm rim of erythema.  normal temp on the feet. Neuro: sensation is intact to touch on the feet, but decreased from normal.     A1c=9.1%    Assessment & Plan:  Insulin-requiring type 2 DM: he needs increased rx.  Toe ulcer, worse.    Patient is advised the following: Patient Instructions  Please see a wound care specialist.  you will receive a phone call, about a day and time for an appointment   blood tests are requested for you today.  We'll let you know about the results. Please take lantus each morning: 65 units if you are going to be active, and 75 if not. Please come back for a follow-up appointment in 3 months  check your blood sugar 4 times a day: before the 3 meals, and at bedtime.  also check if you have symptoms of your blood sugar being too high or too low.  please keep a record of the readings and bring it to your next appointment here (or you can bring the meter itself).  You can write it on any piece of paper.  please call us sooner if your blood sugar goes below 70, or if you have a lot of readings over 200.   Renato Shin, MD

## 2016-04-22 ENCOUNTER — Telehealth: Payer: Self-pay | Admitting: Cardiovascular Disease

## 2016-04-22 NOTE — Telephone Encounter (Signed)
Will await labs.

## 2016-04-22 NOTE — Telephone Encounter (Signed)
Pt wanted Dr C to know that he had his lab work yesterday at Dr Tyrell Antonio office.

## 2016-04-23 LAB — FRUCTOSAMINE: FRUCTOSAMINE: 428 umol/L — AB (ref 190–270)

## 2016-04-26 NOTE — Telephone Encounter (Signed)
Acknowledged.

## 2016-04-27 ENCOUNTER — Other Ambulatory Visit: Payer: Self-pay

## 2016-04-27 MED ORDER — GLUCOSE BLOOD VI STRP: 1.0000 | ORAL_STRIP | Freq: Four times a day (QID) | 12 refills | Status: DC

## 2016-04-27 MED ORDER — NEBIVOLOL HCL 20 MG PO TABS: 20.0000 mg | ORAL_TABLET | Freq: Every day | ORAL | 2 refills | Status: DC

## 2016-04-27 MED ORDER — LISINOPRIL 20 MG PO TABS: 20.0000 mg | ORAL_TABLET | Freq: Two times a day (BID) | ORAL | 3 refills | Status: DC

## 2016-04-27 MED ORDER — SPIRONOLACTONE 50 MG PO TABS: 50.0000 mg | ORAL_TABLET | Freq: Every day | ORAL | 2 refills | Status: DC

## 2016-04-27 MED ORDER — AMLODIPINE BESYLATE 5 MG PO TABS: 10.0000 mg | ORAL_TABLET | Freq: Every day | ORAL | 2 refills | Status: DC

## 2016-04-27 MED ORDER — SIMVASTATIN 40 MG PO TABS: 40.0000 mg | ORAL_TABLET | Freq: Every day | ORAL | 2 refills | Status: DC

## 2016-04-27 NOTE — Telephone Encounter (Signed)
Rx submitted for test strips. Dr. Loanne Drilling does not refill the other medications on the pt's med list. Dr. Sallyanne Kuster refills the rest of the meds on file.

## 2016-04-27 NOTE — Telephone Encounter (Signed)
Please send all of the pt's rx to [facility]. Minus the lantus he has that  Needs the test strips 4 a day and needs lancets

## 2016-04-27 NOTE — Telephone Encounter (Signed)
Rx(s) sent to pharmacy electronically.  

## 2016-04-29 ENCOUNTER — Other Ambulatory Visit: Payer: Self-pay | Admitting: Endocrinology

## 2016-05-04 ENCOUNTER — Encounter (HOSPITAL_BASED_OUTPATIENT_CLINIC_OR_DEPARTMENT_OTHER): Payer: Medicare Other

## 2016-05-07 ENCOUNTER — Telehealth: Payer: Self-pay

## 2016-05-07 NOTE — Telephone Encounter (Signed)
-----   Message from Renato Shin, MD sent at 04/23/2016  5:15 PM EDT ----- please call patient: This is like an a1c of 8.9%, which is still high Please adjust the insulin as we discussed. I'll see you next time.

## 2016-05-07 NOTE — Telephone Encounter (Signed)
Results mailed to the pt.

## 2016-05-12 ENCOUNTER — Other Ambulatory Visit (HOSPITAL_BASED_OUTPATIENT_CLINIC_OR_DEPARTMENT_OTHER): Payer: Self-pay | Admitting: General Surgery

## 2016-05-12 ENCOUNTER — Encounter (HOSPITAL_BASED_OUTPATIENT_CLINIC_OR_DEPARTMENT_OTHER): Payer: Medicare Other | Attending: Surgery

## 2016-05-12 ENCOUNTER — Ambulatory Visit (HOSPITAL_COMMUNITY)
Admission: RE | Admit: 2016-05-12 | Discharge: 2016-05-12 | Disposition: A | Discharge status: Home / Self Care | Payer: Medicare Other | Source: Ambulatory Visit | Attending: General Surgery | Admitting: General Surgery

## 2016-05-12 DIAGNOSIS — Z794 Long term (current) use of insulin: Secondary | ICD-10-CM | POA: Insufficient documentation

## 2016-05-12 DIAGNOSIS — I2510 Atherosclerotic heart disease of native coronary artery without angina pectoris: Secondary | ICD-10-CM | POA: Insufficient documentation

## 2016-05-12 DIAGNOSIS — E11621 Type 2 diabetes mellitus with foot ulcer: Secondary | ICD-10-CM | POA: Insufficient documentation

## 2016-05-12 DIAGNOSIS — L97511 Non-pressure chronic ulcer of other part of right foot limited to breakdown of skin: Secondary | ICD-10-CM | POA: Diagnosis not present

## 2016-05-12 DIAGNOSIS — I252 Old myocardial infarction: Secondary | ICD-10-CM | POA: Insufficient documentation

## 2016-05-12 DIAGNOSIS — I110 Hypertensive heart disease with heart failure: Secondary | ICD-10-CM | POA: Insufficient documentation

## 2016-05-12 DIAGNOSIS — M19071 Primary osteoarthritis, right ankle and foot: Secondary | ICD-10-CM | POA: Diagnosis not present

## 2016-05-12 DIAGNOSIS — S91301D Unspecified open wound, right foot, subsequent encounter: Secondary | ICD-10-CM | POA: Diagnosis not present

## 2016-05-12 DIAGNOSIS — M7731 Calcaneal spur, right foot: Secondary | ICD-10-CM | POA: Diagnosis not present

## 2016-05-12 DIAGNOSIS — I2584 Coronary atherosclerosis due to calcified coronary lesion: Secondary | ICD-10-CM | POA: Insufficient documentation

## 2016-05-12 DIAGNOSIS — L97519 Non-pressure chronic ulcer of other part of right foot with unspecified severity: Secondary | ICD-10-CM | POA: Diagnosis not present

## 2016-05-12 DIAGNOSIS — M869 Osteomyelitis, unspecified: Secondary | ICD-10-CM

## 2016-05-12 DIAGNOSIS — X35XXXA Volcanic eruption, initial encounter: Secondary | ICD-10-CM | POA: Diagnosis not present

## 2016-05-12 DIAGNOSIS — Z955 Presence of coronary angioplasty implant and graft: Secondary | ICD-10-CM | POA: Insufficient documentation

## 2016-05-12 DIAGNOSIS — I5022 Chronic systolic (congestive) heart failure: Secondary | ICD-10-CM | POA: Insufficient documentation

## 2016-05-19 DIAGNOSIS — I2584 Coronary atherosclerosis due to calcified coronary lesion: Secondary | ICD-10-CM | POA: Diagnosis not present

## 2016-05-19 DIAGNOSIS — L97512 Non-pressure chronic ulcer of other part of right foot with fat layer exposed: Secondary | ICD-10-CM | POA: Diagnosis not present

## 2016-05-19 DIAGNOSIS — E11621 Type 2 diabetes mellitus with foot ulcer: Secondary | ICD-10-CM | POA: Diagnosis not present

## 2016-05-20 ENCOUNTER — Telehealth: Payer: Self-pay | Admitting: Endocrinology

## 2016-05-20 MED ORDER — INSULIN GLARGINE 100 UNIT/ML SOLOSTAR PEN: 75.0000 [IU] | PEN_INJECTOR | SUBCUTANEOUS | 5 refills | Status: DC

## 2016-05-20 NOTE — Telephone Encounter (Signed)
PT needs his Lantus Nutritional therapist sent to the [facility] on [address]

## 2016-05-20 NOTE — Telephone Encounter (Signed)
Prescription for Lantus has been sent to the [facility] on [address].

## 2016-05-26 DIAGNOSIS — I2584 Coronary atherosclerosis due to calcified coronary lesion: Secondary | ICD-10-CM | POA: Diagnosis not present

## 2016-05-26 DIAGNOSIS — E11621 Type 2 diabetes mellitus with foot ulcer: Secondary | ICD-10-CM | POA: Diagnosis not present

## 2016-05-26 DIAGNOSIS — L97511 Non-pressure chronic ulcer of other part of right foot limited to breakdown of skin: Secondary | ICD-10-CM | POA: Diagnosis not present

## 2016-05-29 ENCOUNTER — Other Ambulatory Visit: Payer: Self-pay | Admitting: Endocrinology

## 2016-06-02 ENCOUNTER — Encounter (HOSPITAL_BASED_OUTPATIENT_CLINIC_OR_DEPARTMENT_OTHER): Payer: Medicare Other | Attending: Surgery

## 2016-06-02 DIAGNOSIS — I110 Hypertensive heart disease with heart failure: Secondary | ICD-10-CM | POA: Diagnosis not present

## 2016-06-02 DIAGNOSIS — I509 Heart failure, unspecified: Secondary | ICD-10-CM | POA: Diagnosis not present

## 2016-06-02 DIAGNOSIS — E11621 Type 2 diabetes mellitus with foot ulcer: Secondary | ICD-10-CM | POA: Diagnosis not present

## 2016-06-02 DIAGNOSIS — I2584 Coronary atherosclerosis due to calcified coronary lesion: Secondary | ICD-10-CM | POA: Diagnosis not present

## 2016-06-02 DIAGNOSIS — L97511 Non-pressure chronic ulcer of other part of right foot limited to breakdown of skin: Secondary | ICD-10-CM | POA: Insufficient documentation

## 2016-06-02 DIAGNOSIS — I252 Old myocardial infarction: Secondary | ICD-10-CM | POA: Diagnosis not present

## 2016-06-09 DIAGNOSIS — I2584 Coronary atherosclerosis due to calcified coronary lesion: Secondary | ICD-10-CM | POA: Diagnosis not present

## 2016-06-09 DIAGNOSIS — L97511 Non-pressure chronic ulcer of other part of right foot limited to breakdown of skin: Secondary | ICD-10-CM | POA: Diagnosis not present

## 2016-06-09 DIAGNOSIS — E11621 Type 2 diabetes mellitus with foot ulcer: Secondary | ICD-10-CM | POA: Diagnosis not present

## 2016-06-09 DIAGNOSIS — L97519 Non-pressure chronic ulcer of other part of right foot with unspecified severity: Secondary | ICD-10-CM | POA: Diagnosis not present

## 2016-06-10 ENCOUNTER — Other Ambulatory Visit: Payer: Self-pay | Admitting: Surgery

## 2016-06-10 DIAGNOSIS — I739 Peripheral vascular disease, unspecified: Secondary | ICD-10-CM

## 2016-06-16 DIAGNOSIS — L97511 Non-pressure chronic ulcer of other part of right foot limited to breakdown of skin: Secondary | ICD-10-CM | POA: Diagnosis not present

## 2016-06-16 DIAGNOSIS — E11621 Type 2 diabetes mellitus with foot ulcer: Secondary | ICD-10-CM | POA: Diagnosis not present

## 2016-06-16 DIAGNOSIS — I2584 Coronary atherosclerosis due to calcified coronary lesion: Secondary | ICD-10-CM | POA: Diagnosis not present

## 2016-06-18 ENCOUNTER — Ambulatory Visit (HOSPITAL_COMMUNITY)
Admission: RE | Admit: 2016-06-18 | Discharge: 2016-06-18 | Disposition: A | Discharge status: Home / Self Care | Payer: Medicare Other | Source: Ambulatory Visit | Attending: Cardiology | Admitting: Cardiology

## 2016-06-18 DIAGNOSIS — I739 Peripheral vascular disease, unspecified: Secondary | ICD-10-CM

## 2016-06-23 DIAGNOSIS — L97511 Non-pressure chronic ulcer of other part of right foot limited to breakdown of skin: Secondary | ICD-10-CM | POA: Diagnosis not present

## 2016-06-23 DIAGNOSIS — E11621 Type 2 diabetes mellitus with foot ulcer: Secondary | ICD-10-CM | POA: Diagnosis not present

## 2016-06-23 DIAGNOSIS — I2584 Coronary atherosclerosis due to calcified coronary lesion: Secondary | ICD-10-CM | POA: Diagnosis not present

## 2016-06-30 ENCOUNTER — Encounter (HOSPITAL_BASED_OUTPATIENT_CLINIC_OR_DEPARTMENT_OTHER): Payer: Medicare Other | Attending: Surgery

## 2016-06-30 DIAGNOSIS — I252 Old myocardial infarction: Secondary | ICD-10-CM | POA: Insufficient documentation

## 2016-06-30 DIAGNOSIS — I2510 Atherosclerotic heart disease of native coronary artery without angina pectoris: Secondary | ICD-10-CM | POA: Diagnosis not present

## 2016-06-30 DIAGNOSIS — Z09 Encounter for follow-up examination after completed treatment for conditions other than malignant neoplasm: Secondary | ICD-10-CM | POA: Diagnosis not present

## 2016-06-30 DIAGNOSIS — I110 Hypertensive heart disease with heart failure: Secondary | ICD-10-CM | POA: Diagnosis not present

## 2016-06-30 DIAGNOSIS — I509 Heart failure, unspecified: Secondary | ICD-10-CM | POA: Diagnosis not present

## 2016-06-30 DIAGNOSIS — E11621 Type 2 diabetes mellitus with foot ulcer: Secondary | ICD-10-CM | POA: Diagnosis not present

## 2016-06-30 DIAGNOSIS — Z8631 Personal history of diabetic foot ulcer: Secondary | ICD-10-CM | POA: Insufficient documentation

## 2016-07-08 ENCOUNTER — Encounter: Payer: Self-pay | Admitting: Cardiovascular Disease

## 2016-07-08 ENCOUNTER — Ambulatory Visit (INDEPENDENT_AMBULATORY_CARE_PROVIDER_SITE_OTHER): Payer: Medicare Other | Admitting: Cardiovascular Disease

## 2016-07-08 VITALS — BP 128/76 | HR 73 | Ht 72.0 in | Wt 201.0 lb

## 2016-07-08 DIAGNOSIS — I5022 Chronic systolic (congestive) heart failure: Secondary | ICD-10-CM | POA: Diagnosis not present

## 2016-07-08 DIAGNOSIS — K8689 Other specified diseases of pancreas: Secondary | ICD-10-CM

## 2016-07-08 DIAGNOSIS — E782 Mixed hyperlipidemia: Secondary | ICD-10-CM | POA: Diagnosis not present

## 2016-07-08 DIAGNOSIS — I10 Essential (primary) hypertension: Secondary | ICD-10-CM | POA: Diagnosis not present

## 2016-07-08 DIAGNOSIS — E089 Diabetes mellitus due to underlying condition without complications: Secondary | ICD-10-CM

## 2016-07-08 DIAGNOSIS — I255 Ischemic cardiomyopathy: Secondary | ICD-10-CM | POA: Diagnosis not present

## 2016-07-08 DIAGNOSIS — I2510 Atherosclerotic heart disease of native coronary artery without angina pectoris: Secondary | ICD-10-CM | POA: Diagnosis not present

## 2016-07-08 NOTE — Progress Notes (Signed)
Patient ID: David Lane, male   DOB: [birth year], 70 y.o.   MRN: [id number] Patient ID: David Lane, male   DOB: [birth year], 70 y.o.   MRN: [id number]    Cardiology Office Note    Date:  07/09/2016   ID:  David, Lane 1946/05/05, MRN [id number]  PCP:  [name], MD  Cardiologist:   [name], MD   chief complaint: BP management.   History of Present Illness:  David Lane is a 70 y.o. male with severe systemic hypertension and mild ischemic cardiomyopathy previously complicated by heart failure exacerbation. He has brittle diabetes mellitus following an episode of severe pancreatitis that caused both endocrine and exocrine pancreatic insufficiency.  Imaged appears more relaxed about his health and he did in the past. He has been checking his blood pressure routinely, sometimes several times a day. For the most part blood pressure recordings are excellent in the 110/70-120/80 range. One recording showed a systolic blood pressure of 99, but he was not dizzy at the time. On a couple of occasions his systolic blood pressure has exceeded 140 by a few points.   He has become less physically active. His job used to be quite demanding physically. He had a sore on the dorsum of his right foot that took several months to heal with help from the wound center. Ankle brachial indices were tested at that time and found to be normal.  He denies problems with shortness of breath at rest or with activity, orthopnea, PND, angina at rest or with exertion, palpitations, syncope or leg edema or claudication. He has not had recent problems with hypoglycemia.   Past Medical History:  Diagnosis Date  . CAD (coronary artery disease)    last cath by [facility] DR.  Desani Sprung showing  some  disease involving LCX and small size of Diag   . CHF exacerbation, due to diastolic dysfunction A999333  . Chronic systolic CHF (congestive heart failure), NYHA class 1 (David Lane) 07/17/2012  .  GERD (gastroesophageal reflux disease)   . Hepatic lesion 02/04/11  . Hyperlipemia   . Hypertension   . Ischemic cardiomyopathy    EF 35-40%  . Liver hemangioma   . NSTEMI (non-ST elevated myocardial infarction) ([place]) 11/21/2009  . Pancreatitis 2000's  . Shortness of breath   . Type II diabetes mellitus ([place])     Past Surgical History:  Procedure Laterality Date  . CARDIAC CATHETERIZATION  2011   minimal disease, medical management  . CORONARY ANGIOPLASTY WITH STENT PLACEMENT  07/14/2012   successful PCI & stenting of mid LAD & PDA off the dominant CX  . INCISION AND DRAINAGE ABSCESS ANAL  1970's  . LEFT HEART CATHETERIZATION WITH CORONARY ANGIOGRAM N/A 07/14/2012   Procedure: LEFT HEART CATHETERIZATION WITH CORONARY ANGIOGRAM;  Surgeon: [name], MD;  Location: [facility] CATH LAB;  Service: Cardiovascular;  Laterality: N/A;  . PERCUTANEOUS CORONARY STENT INTERVENTION (PCI-S) Right 07/14/2012   Procedure: PERCUTANEOUS CORONARY STENT INTERVENTION (PCI-S);  Surgeon: [name], MD;  Location: [facility] CATH LAB;  Service: Cardiovascular;  Laterality: Right;    Outpatient Medications Prior to Visit  Medication Sig Dispense Refill  . aspirin 81 MG tablet Take 81 mg by mouth daily.      Marland Kitchen CREON 36000 units CPEP capsule TAKE 3 CAPSULES (108,000 UNITS TOTAL) BY MOUTH 3 (THREE) TIMES DAILY WITH MEALS. AND SNACKS 810 capsule 0  . glucose blood (ONE TOUCH ULTRA TEST) test strip 1 each by Other route 4 (four)  times daily. And lancets 4/day 120 each 12  . Insulin Glargine (BASAGLAR KWIKPEN) 100 UNIT/ML SOPN INJECT 0.65 MLS (65 UNITS TOTAL) INTO THE SKIN EVERY MORNING. AND PEN NEEDLES 1/DAY 10 pen 11  . Insulin Glargine (LANTUS SOLOSTAR) 100 UNIT/ML Solostar Pen Inject 75 Units into the skin every morning. Take 65 units if you are going to be active that day 30 mL 5  . lisinopril (PRINIVIL,ZESTRIL) 20 MG tablet Take 1 tablet (20 mg total) by mouth 2 (two) times daily. 180 tablet 3  . Multiple Vitamin  (MULTIVITAMIN) capsule Take 1 capsule by mouth daily. HOLD UNTIL ABLE TO SWALLOW PILLS WHOLE    . Nebivolol HCl (BYSTOLIC) 20 MG TABS Take 1 tablet (20 mg total) by mouth daily. 90 tablet 2  . omeprazole (PRILOSEC) 20 MG capsule Take 20 mg by mouth daily.    . simvastatin (ZOCOR) 40 MG tablet Take 1 tablet (40 mg total) by mouth at bedtime. 90 tablet 2  . spironolactone (ALDACTONE) 50 MG tablet Take 1 tablet (50 mg total) by mouth daily. 90 tablet 2  . amLODipine (NORVASC) 5 MG tablet Take 2 tablets (10 mg total) by mouth daily. 180 tablet 2   No facility-administered medications prior to visit.      Allergies:   Lipitor [atorvastatin]   Social History   Social History  . Marital status: Single    Spouse name: N/A  . Number of children: 0  . Years of education: N/A   Occupational History  . [employer]   .  [employer]   Social History Main Topics  . Smoking status: Never Smoker  . Smokeless tobacco: Never Used  . Alcohol use No  . Drug use: No  . Sexual activity: No   Other Topics Concern  . None   Social History Narrative  . None     Family History:  The patient's family history includes Diabetes in his maternal grandfather, mother, and paternal grandmother.   ROS:   Please see the history of present illness.    ROS All other systems reviewed and are negative.   PHYSICAL EXAM:   VS:  BP 128/76   Pulse 73   Ht 6' (1.829 m)   Wt 201 lb (91.2 kg)   BMI 27.26 kg/m    GEN: Well nourished, well developed, in no acute distress  HEENT: normal  Neck: no JVD, carotid bruits, or masses Cardiac: RRR; no murmurs, rubs, or gallops,no edema  Respiratory:  clear to auscultation bilaterally, normal work of breathing GI: soft, nontender, nondistended, + BS MS: no deformity or atrophy  Skin: warm and dry, no rash Neuro:  Alert and Oriented x 3, Strength and sensation are intact Psych: euthymic mood, full affect  Wt Readings from Last 3 Encounters:  07/08/16 201 lb  (91.2 kg)  04/21/16 191 lb (86.6 kg)  04/15/16 193 lb 9.6 oz (87.8 kg)      Studies/Labs Reviewed:   EKG:  EKG is ordered today. . It shows normal sinus rhythm with a mild first-degree AV block (226 ms) and an old anteroseptal myocardial infarction. There are no acute repolarization changes Recent Labs: 04/21/2016: ALT 18; BUN 28; Creatinine, Ser 1.27; Potassium 4.0; Sodium 137   Lipid Panel    Component Value Date/Time   CHOL 113 04/21/2016 0833   TRIG 143.0 04/21/2016 0833   HDL 27.10 (L) 04/21/2016 0833   CHOLHDL 4 04/21/2016 0833   VLDL 28.6 04/21/2016 0833   LDLCALC 57 04/21/2016 [id number]  ASSESSMENT:    1. Chronic systolic CHF (congestive heart failure), NYHA class 1 (David Lane)   2. Ischemic cardiomyopathy, EF 50% by echo 08/01/13   3. Essential hypertension   4. Mixed hyperlipidemia   5. Coronary artery disease involving native coronary artery of native heart without angina pectoris   6. 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 Pancreatic insufficiency      PLAN:  In order of problems listed above:  1. CHF: NYHA class I, euvolemic. Does not require loop diuretics. On ACE inhibitor,  Beta blocker. He is really not symptomatic. David Lane is considered, may be an option in the future. He did not tolerate carvedilol well, possibly due to the very rapid onset of the medication, doing better on bystolic. 2. Isch CMP: Most recent assessment of ejection fraction showed improvement with EF 50%  3. HTN: Blood pressure seems to have settled in the more stable pattern and he no longer constantly self-adjusts his medications, as he did in the past. Hopefully this is a lasting development. 4. CAD: Angina free 5. HLP: Excellent lipid profile the exception of low HDL cholesterol. He is encouraged to become physically active again. 6. 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 Last hemoglobin A1c in July was  9.1% 7. Pancreatic insufficiency might be contributing to less predictable exertion of nutrients and medications; this may explain some of the volatility and glycemic control, volume status and blood pressure.  David Lane is very anxious. He requires constant reassurance. Will keep his appointments on at least an every three-month basis.  Medication Adjustments/Labs and Tests Ordered: Current medicines are reviewed at length with the patient today.  Concerns regarding medicines are outlined above.  Medication changes, Labs and Tests ordered today are listed in the Patient Instructions below. Patient Instructions  Dr Sallyanne Kuster recommends that you schedule a follow-up appointment in 3 months.  If you need a refill on your cardiac medications before your next appointment, please call your pharmacy.   Signed, [name], MD  07/09/2016 6:15 PM    [facility] [place], [place], [place]  [address] Phone: [phone]; Fax: [phone]

## 2016-07-08 NOTE — Patient Instructions (Signed)
Dr Croitoru recommends that you schedule a follow-up appointment in 3 months.  If you need a refill on your cardiac medications before your next appointment, please call your pharmacy. 

## 2016-07-22 ENCOUNTER — Encounter: Payer: Self-pay | Admitting: Endocrinology

## 2016-07-22 ENCOUNTER — Ambulatory Visit (INDEPENDENT_AMBULATORY_CARE_PROVIDER_SITE_OTHER): Payer: Medicare Other | Admitting: Endocrinology

## 2016-07-22 VITALS — BP 122/74 | HR 69 | Ht 72.0 in | Wt 204.0 lb

## 2016-07-22 DIAGNOSIS — K8689 Other specified diseases of pancreas: Secondary | ICD-10-CM

## 2016-07-22 DIAGNOSIS — E089 Diabetes mellitus due to underlying condition without complications: Secondary | ICD-10-CM

## 2016-07-22 LAB — POCT GLYCOSYLATED HEMOGLOBIN (HGB A1C): Hemoglobin A1C: 8.4

## 2016-07-22 MED ORDER — INSULIN GLARGINE 100 UNIT/ML SOLOSTAR PEN: 80.0000 [IU] | PEN_INJECTOR | SUBCUTANEOUS | 5 refills | Status: DC

## 2016-07-22 NOTE — Patient Instructions (Addendum)
Please take lantus each morning: 70 units if you are going to be active, and 80 if not.  Please come back for a follow-up appointment in 3 months.   check your blood sugar twice a day.  vary the time of day when you check, between before the 3 meals, and at bedtime.  also check if you have symptoms of your blood sugar being too high or too low.  please keep a record of the readings and bring it to your next appointment here (or you can bring the meter itself).  You can write it on any piece of paper.  please call us sooner if your blood sugar goes below 70, or if you have a lot of readings over 200.

## 2016-07-22 NOTE — Progress Notes (Signed)
Subjective:    Patient ID: David Lane, male    DOB: 1945/12/29, 70 y.o.   MRN: [id number]  HPI Pt returns for f/u of diabetes mellitus: DM type: due to pancreatic insufficiency Dx'ed: 1988, during an episode of pancreatitis.   Complications: CAD, retinopathy, and polyneuropathy.  Therapy: insulin since 1990 DKA: never.   Severe hypoglycemia: last episode was in 2014; he says this was probably due to taking more than the prescribed insulin dosage.  Other: in December of 2014, he was changed to qd lantus, for further simplification of his insulin schedule.  V-GO pump was considered, but pt did not keep appt for it, and he later declined it; he needs to vary insulin due to anticipated activity.  Interval history: He takes insulin as rx'ed.  He brings a record of his cbg's which I have reviewed today.  It varies from 166-400 fasting, which is the only time of day he checks.  pt states he feels well in general.  Past Medical History:  Diagnosis Date  . CAD (coronary artery disease)    last cath by [facility] DR.  Mihai Croitoru showing  some  disease involving LCX and small size of Diag   . CHF exacerbation, due to diastolic dysfunction A999333  . Chronic systolic CHF (congestive heart failure), NYHA class 1 ([place]) 07/17/2012  . GERD (gastroesophageal reflux disease)   . Hepatic lesion 02/04/11  . Hyperlipemia   . Hypertension   . Ischemic cardiomyopathy    EF 35-40%  . Liver hemangioma   . NSTEMI (non-ST elevated myocardial infarction) ([place]) 11/21/2009  . Pancreatitis 2000's  . Shortness of breath   . Type II diabetes mellitus ([place])     Past Surgical History:  Procedure Laterality Date  . CARDIAC CATHETERIZATION  2011   minimal disease, medical management  . CORONARY ANGIOPLASTY WITH STENT PLACEMENT  07/14/2012   successful PCI & stenting of mid LAD & PDA off the dominant CX  . INCISION AND DRAINAGE ABSCESS ANAL  1970's  . LEFT HEART CATHETERIZATION WITH CORONARY ANGIOGRAM N/A  07/14/2012   Procedure: LEFT HEART CATHETERIZATION WITH CORONARY ANGIOGRAM;  Surgeon: [name], MD;  Location: [facility] CATH LAB;  Service: Cardiovascular;  Laterality: N/A;  . PERCUTANEOUS CORONARY STENT INTERVENTION (PCI-S) Right 07/14/2012   Procedure: PERCUTANEOUS CORONARY STENT INTERVENTION (PCI-S);  Surgeon: [name], MD;  Location: [facility] CATH LAB;  Service: Cardiovascular;  Laterality: Right;    Social History   Social History  . Marital status: Single    Spouse name: N/A  . Number of children: 0  . Years of education: N/A   Occupational History  . [employer]   .  [employer]   Social History Main Topics  . Smoking status: Never Smoker  . Smokeless tobacco: Never Used  . Alcohol use No  . Drug use: No  . Sexual activity: No   Other Topics Concern  . Not on file   Social History Narrative  . No narrative on file    Current Outpatient Prescriptions on File Prior to Visit  Medication Sig Dispense Refill  . amLODipine (NORVASC) 10 MG tablet Take 10 mg by mouth daily.    Marland Kitchen aspirin 81 MG tablet Take 81 mg by mouth daily.      Marland Kitchen CREON 36000 units CPEP capsule TAKE 3 CAPSULES (108,000 UNITS TOTAL) BY MOUTH 3 (THREE) TIMES DAILY WITH MEALS. AND SNACKS 810 capsule 0  . glucose blood (ONE TOUCH ULTRA TEST) test strip 1 each  by Other route 4 (four) times daily. And lancets 4/day 120 each 12  . lisinopril (PRINIVIL,ZESTRIL) 20 MG tablet Take 1 tablet (20 mg total) by mouth 2 (two) times daily. 180 tablet 3  . Multiple Vitamin (MULTIVITAMIN) capsule Take 1 capsule by mouth daily. HOLD UNTIL ABLE TO SWALLOW PILLS WHOLE    . Nebivolol HCl (BYSTOLIC) 20 MG TABS Take 1 tablet (20 mg total) by mouth daily. 90 tablet 2  . omeprazole (PRILOSEC) 20 MG capsule Take 20 mg by mouth daily.    . simvastatin (ZOCOR) 40 MG tablet Take 1 tablet (40 mg total) by mouth at bedtime. 90 tablet 2  . spironolactone (ALDACTONE) 50 MG tablet Take 1 tablet (50 mg total) by mouth daily. 90 tablet 2     No current facility-administered medications on file prior to visit.     Allergies  Allergen Reactions  . Lipitor [Atorvastatin] Other (See Comments)    Muscle weakness    Family History  Problem Relation Age of Onset  . Diabetes Mother   . Diabetes Paternal Grandmother   . Diabetes Maternal Grandfather   . Colon cancer Neg Hx     BP 122/74   Pulse 69   Ht 6' (1.829 m)   Wt 204 lb (92.5 kg)   SpO2 94%   BMI 27.67 kg/m    Review of Systems He denies hypoglycemia.      Objective:   Physical Exam VITAL SIGNS:  See vs page GENERAL: no distress Pulses: dorsalis pedis intact bilat.   MSK: no deformity of the feet CV: no leg edema.   Skin:  The right foot instep ulcer is almost completely healed.  No erythema.  normal temp on the feet.  Neuro: sensation is intact to touch on the feet, but decreased from normal.    A1c=8.4%    Assessment & Plan:  DM due to pancreatic insufficiency, with CAD.  He needs increased rx   Patient is advised the following: Patient Instructions  Please take lantus each morning: 70 units if you are going to be active, and 80 if not.  Please come back for a follow-up appointment in 3 months.   check your blood sugar twice a day.  vary the time of day when you check, between before the 3 meals, and at bedtime.  also check if you have symptoms of your blood sugar being too high or too low.  please keep a record of the readings and bring it to your next appointment here (or you can bring the meter itself).  You can write it on any piece of paper.  please call us sooner if your blood sugar goes below 70, or if you have a lot of readings over 200.

## 2016-08-10 ENCOUNTER — Telehealth: Payer: Self-pay | Admitting: Cardiovascular Disease

## 2016-08-10 ENCOUNTER — Telehealth: Payer: Self-pay | Admitting: Endocrinology

## 2016-08-10 MED ORDER — INSULIN GLARGINE 100 UNIT/ML SOLOSTAR PEN: 80.0000 [IU] | PEN_INJECTOR | SUBCUTANEOUS | 5 refills | Status: DC

## 2016-08-10 MED ORDER — GLUCOSE BLOOD VI STRP: 1.0000 | ORAL_STRIP | Freq: Four times a day (QID) | 12 refills | Status: DC

## 2016-08-10 NOTE — Telephone Encounter (Signed)
Called patient, no answer. Goes to notification w no means to leave voice mail.

## 2016-08-10 NOTE — Telephone Encounter (Signed)
Patient need a prescription for  Medication   Insulin Glargine (LANTUS SOLOSTAR) 100 UNIT/ML Solostar Pen  glucose blood (ONE TOUCH ULTRA TEST) test strip  [id number] [address], [place] - [place] [phone] (Phone) [phone] (Fax)   He no longer use [facility]

## 2016-08-10 NOTE — Telephone Encounter (Signed)
Pt having problem getting Bystolic refilled through [facility] they won't pay for name brand, so needs generic or another comparable med--pls call [phone]--uses [facility]

## 2016-08-10 NOTE — Telephone Encounter (Signed)
Refills submitted per patient's request.  

## 2016-08-11 NOTE — Telephone Encounter (Signed)
No means to leave patient voice mail.

## 2016-08-13 ENCOUNTER — Telehealth: Payer: Self-pay | Admitting: Cardiovascular Disease

## 2016-08-13 NOTE — Telephone Encounter (Signed)
Spoke with sherry,questions regarding bystolic answered.

## 2016-08-13 NOTE — Telephone Encounter (Signed)
Unable to reach pt or leave a message to let him know bystolic has been approved.

## 2016-08-13 NOTE — Telephone Encounter (Signed)
Judeen Hammans [facility] ) is calling about a medication request on Bystolic ( Mr. Bassetti is trying to get it filled ) and they need authorization questions answered . Please call at [phone] option 5  Thanks

## 2016-08-13 NOTE — Telephone Encounter (Signed)
New Message  David Lane voiced he's calling in regards to a prior authorization and would like for someone to give him a call.  David Lane voiced it's Bystolic 20 mg, stated it's been approved and they're going to mail letter.

## 2016-08-16 DIAGNOSIS — E119 Type 2 diabetes mellitus without complications: Secondary | ICD-10-CM | POA: Diagnosis not present

## 2016-09-27 DIAGNOSIS — E119 Type 2 diabetes mellitus without complications: Secondary | ICD-10-CM | POA: Diagnosis not present

## 2016-09-30 ENCOUNTER — Encounter: Payer: Self-pay | Admitting: Endocrinology

## 2016-09-30 ENCOUNTER — Ambulatory Visit (INDEPENDENT_AMBULATORY_CARE_PROVIDER_SITE_OTHER): Payer: Medicare Other | Admitting: Endocrinology

## 2016-09-30 VITALS — BP 138/84 | HR 86 | Ht 72.0 in | Wt 205.0 lb

## 2016-09-30 DIAGNOSIS — E089 Diabetes mellitus due to underlying condition without complications: Secondary | ICD-10-CM

## 2016-09-30 DIAGNOSIS — K8689 Other specified diseases of pancreas: Secondary | ICD-10-CM | POA: Diagnosis not present

## 2016-09-30 LAB — POCT GLYCOSYLATED HEMOGLOBIN (HGB A1C): HEMOGLOBIN A1C: 8.2

## 2016-09-30 MED ORDER — INSULIN GLARGINE 100 UNIT/ML SOLOSTAR PEN: 75.0000 [IU] | PEN_INJECTOR | SUBCUTANEOUS | 5 refills | Status: DC

## 2016-09-30 NOTE — Progress Notes (Signed)
Subjective:    Patient ID: David Lane, male    DOB: [birth year], 71 y.o.   MRN: [id number]  HPI Pt returns for f/u of diabetes mellitus: DM type: due to pancreatic insufficiency Dx'ed: 1988, during an episode of pancreatitis.   Complications: CAD, retinopathy, and polyneuropathy.  Therapy: insulin since 1990 DKA: never.   Severe hypoglycemia: last episode was in 2014; he says this was probably due to taking more than the prescribed insulin dosage.  Other: in December of 2014, he was changed to qd lantus, for further simplification of his insulin schedule.  V-GO pump was considered, but pt did not keep appt for it, and he later declined it; he needs to vary insulin due to anticipated activity.  He is retired.   Interval history: He takes insulin as rx'ed.  pt states he feels well in general.  He has mild hypoglycemia approx once per month (60's).  This happens on days when he takes 80 units (which is seldom).  He brings a record of his cbg's which I have reviewed today.  It varies from 60-440.  There is no trend throughout the day.   Past Medical History:  Diagnosis Date  . CAD (coronary artery disease)    last cath by [facility] DR.  Mihai Croitoru showing  some  disease involving LCX and small size of Diag   . CHF exacerbation, due to diastolic dysfunction [id number]  . Chronic systolic CHF (congestive heart failure), NYHA class 1 ([place]) 07/17/2012  . GERD (gastroesophageal reflux disease)   . Hepatic lesion 02/04/11  . Hyperlipemia   . Hypertension   . Ischemic cardiomyopathy    EF 35-40%  . Liver hemangioma   . NSTEMI (non-ST elevated myocardial infarction) ([place]) 11/21/2009  . Pancreatitis 2000's  . Shortness of breath   . Type II diabetes mellitus ([place])     Past Surgical History:  Procedure Laterality Date  . CARDIAC CATHETERIZATION  2011   minimal disease, medical management  . CORONARY ANGIOPLASTY WITH STENT PLACEMENT  07/14/2012   successful PCI & stenting of mid LAD & PDA  off the dominant CX  . INCISION AND DRAINAGE ABSCESS ANAL  1970's  . LEFT HEART CATHETERIZATION WITH CORONARY ANGIOGRAM N/A 07/14/2012   Procedure: LEFT HEART CATHETERIZATION WITH CORONARY ANGIOGRAM;  Surgeon: [name], MD;  Location: [facility] CATH LAB;  Service: Cardiovascular;  Laterality: N/A;  . PERCUTANEOUS CORONARY STENT INTERVENTION (PCI-S) Right 07/14/2012   Procedure: PERCUTANEOUS CORONARY STENT INTERVENTION (PCI-S);  Surgeon: [name], MD;  Location: [facility] CATH LAB;  Service: Cardiovascular;  Laterality: Right;    Social History   Social History  . Marital status: Single    Spouse name: N/A  . Number of children: 0  . Years of education: N/A   Occupational History  . [employer]   .  [employer]   Social History Main Topics  . Smoking status: Never Smoker  . Smokeless tobacco: Never Used  . Alcohol use No  . Drug use: No  . Sexual activity: No   Other Topics Concern  . Not on file   Social History Narrative  . No narrative on file    Current Outpatient Prescriptions on File Prior to Visit  Medication Sig Dispense Refill  . amLODipine (NORVASC) 10 MG tablet Take 10 mg by mouth daily.    Marland Kitchen aspirin 81 MG tablet Take 81 mg by mouth daily.      Marland Kitchen CREON 36000 units CPEP capsule TAKE 3 CAPSULES (108,000  UNITS TOTAL) BY MOUTH 3 (THREE) TIMES DAILY WITH MEALS. AND SNACKS 810 capsule 0  . glucose blood (ONE TOUCH ULTRA TEST) test strip 1 each by Other route 4 (four) times daily. And lancets 4/day 120 each 12  . lisinopril (PRINIVIL,ZESTRIL) 20 MG tablet Take 1 tablet (20 mg total) by mouth 2 (two) times daily. 180 tablet 3  . Multiple Vitamin (MULTIVITAMIN) capsule Take 1 capsule by mouth daily. HOLD UNTIL ABLE TO SWALLOW PILLS WHOLE    . Nebivolol HCl (BYSTOLIC) 20 MG TABS Take 1 tablet (20 mg total) by mouth daily. 90 tablet 2  . omeprazole (PRILOSEC) 20 MG capsule Take 20 mg by mouth daily.    . simvastatin (ZOCOR) 40 MG tablet Take 1 tablet (40 mg total) by  mouth at bedtime. 90 tablet 2  . spironolactone (ALDACTONE) 50 MG tablet Take 1 tablet (50 mg total) by mouth daily. 90 tablet 2   No current facility-administered medications on file prior to visit.     Allergies  Allergen Reactions  . Lipitor [Atorvastatin] Other (See Comments)    Muscle weakness    Family History  Problem Relation Age of Onset  . Diabetes Mother   . Diabetes Paternal Grandmother   . Diabetes Maternal Grandfather   . Colon cancer Neg Hx     BP 138/84   Pulse 86   Ht 6' (1.829 m)   Wt 205 lb (93 kg)   SpO2 98%   BMI 27.80 kg/m    Review of Systems Denies LOC    Objective:   Physical Exam VITAL SIGNS:  See vs page GENERAL: no distress Pulses: dorsalis pedis intact bilat.   MSK: no deformity of the feet CV: no leg edema Skin:  no ulcer on the feet.  normal color and temp on the feet. Neuro: sensation is intact to touch on the feet, but decreased from normal.     A1c=8.2%    Assessment & Plan:  DM due to pancreatic insufficiency: he needs increased rx.    Patient is advised the following: Patient Instructions  Please take lantus, 75 each morning, every day.  On this type of insulin schedule, you should eat meals on a regular schedule.  If a meal is missed or significantly delayed, your blood sugar could go low. Please come back for a follow-up appointment in 3 months.   check your blood sugar twice a day.  vary the time of day when you check, between before the 3 meals, and at bedtime.  also check if you have symptoms of your blood sugar being too high or too low.  please keep a record of the readings and bring it to your next appointment here (or you can bring the meter itself).  You can write it on any piece of paper.  please call us sooner if your blood sugar goes below 70, or if you have a lot of readings over 200.

## 2016-09-30 NOTE — Patient Instructions (Addendum)
Please take lantus, 75 each morning, every day.  On this type of insulin schedule, you should eat meals on a regular schedule.  If a meal is missed or significantly delayed, your blood sugar could go low. Please come back for a follow-up appointment in 3 months.   check your blood sugar twice a day.  vary the time of day when you check, between before the 3 meals, and at bedtime.  also check if you have symptoms of your blood sugar being too high or too low.  please keep a record of the readings and bring it to your next appointment here (or you can bring the meter itself).  You can write it on any piece of paper.  please call us sooner if your blood sugar goes below 70, or if you have a lot of readings over 200.

## 2016-10-04 ENCOUNTER — Other Ambulatory Visit: Payer: Self-pay

## 2016-10-04 MED ORDER — INSULIN GLARGINE 100 UNIT/ML SOLOSTAR PEN: 75.0000 [IU] | PEN_INJECTOR | SUBCUTANEOUS | 4 refills | Status: DC

## 2016-10-08 ENCOUNTER — Ambulatory Visit (INDEPENDENT_AMBULATORY_CARE_PROVIDER_SITE_OTHER): Payer: Medicare Other | Admitting: Cardiovascular Disease

## 2016-10-08 ENCOUNTER — Encounter: Payer: Self-pay | Admitting: Cardiovascular Disease

## 2016-10-08 VITALS — BP 120/83 | HR 100 | Ht 72.0 in | Wt 208.7 lb

## 2016-10-08 DIAGNOSIS — I10 Essential (primary) hypertension: Secondary | ICD-10-CM

## 2016-10-08 DIAGNOSIS — E782 Mixed hyperlipidemia: Secondary | ICD-10-CM

## 2016-10-08 DIAGNOSIS — I255 Ischemic cardiomyopathy: Secondary | ICD-10-CM | POA: Diagnosis not present

## 2016-10-08 DIAGNOSIS — E089 Diabetes mellitus due to underlying condition without complications: Secondary | ICD-10-CM

## 2016-10-08 DIAGNOSIS — I5022 Chronic systolic (congestive) heart failure: Secondary | ICD-10-CM

## 2016-10-08 DIAGNOSIS — I2510 Atherosclerotic heart disease of native coronary artery without angina pectoris: Secondary | ICD-10-CM | POA: Diagnosis not present

## 2016-10-08 DIAGNOSIS — K8689 Other specified diseases of pancreas: Secondary | ICD-10-CM

## 2016-10-08 MED ORDER — SPIRONOLACTONE 50 MG PO TABS: 50.0000 mg | ORAL_TABLET | Freq: Every day | ORAL | 3 refills | Status: DC

## 2016-10-08 NOTE — Progress Notes (Signed)
Patient ID: David Lane, male   DOB: 1945/12/23, 71 y.o.   MRN: [id number] Patient ID: David Lane, male   DOB: [birth year], 71 y.o.   MRN: [id number]    Cardiology Office Note    Date:  10/08/2016   ID:  David, Lane December 28, 1945, MRN [id number]  PCP:  [name], MD  Cardiologist:   [name], MD   chief complaint: BP management.   History of Present Illness:  [name] is a 71 y.o. male with severe systemic hypertension and mild ischemic cardiomyopathy previously complicated by heart failure exacerbation. He has brittle diabetes mellitus following an episode of severe pancreatitis that caused both endocrine and exocrine pancreatic insufficiency.  As always he has a lot of anxieties, but generally Ray appears more relaxed about his health than he did in the past. He has been checking his blood pressure routinely, sometimes several times a day. For the most part blood pressure recordings are excellent in the 110/70-120/80 range. On two occasions he had systolic blood pressure of <100, felt slightly dizzy at the time. During the spell of hyperglycemia systolic blood pressure has increased to about 200, briefly.  He has found it difficult to become more physically active. His job used to be quite demanding physically. His foot ulcer has completely healed.   He denies problems with shortness of breath at rest or with activity, orthopnea, PND, angina at rest or with exertion, palpitations, syncope or leg edema or claudication. He has not had recent problems with hypoglycemia.   Past Medical History:  Diagnosis Date  . CAD (coronary artery disease)    last cath by [facility] DR.  Yitzchak Kothari showing  some  disease involving LCX and small size of Diag   . CHF exacerbation, due to diastolic dysfunction [id number]  . Chronic systolic CHF (congestive heart failure), NYHA class 1 ([place]) 07/17/2012  . GERD (gastroesophageal reflux disease)   . Hepatic lesion 02/04/11    . Hyperlipemia   . Hypertension   . Ischemic cardiomyopathy    EF 35-40%  . Liver hemangioma   . NSTEMI (non-ST elevated myocardial infarction) ([place]) 11/21/2009  . Pancreatitis 2000's  . Shortness of breath   . Type II diabetes mellitus ([place])     Past Surgical History:  Procedure Laterality Date  . CARDIAC CATHETERIZATION  2011   minimal disease, medical management  . CORONARY ANGIOPLASTY WITH STENT PLACEMENT  07/14/2012   successful PCI & stenting of mid LAD & PDA off the dominant CX  . INCISION AND DRAINAGE ABSCESS ANAL  1970's  . LEFT HEART CATHETERIZATION WITH CORONARY ANGIOGRAM N/A 07/14/2012   Procedure: LEFT HEART CATHETERIZATION WITH CORONARY ANGIOGRAM;  Surgeon: [name], MD;  Location: [facility] CATH LAB;  Service: Cardiovascular;  Laterality: N/A;  . PERCUTANEOUS CORONARY STENT INTERVENTION (PCI-S) Right 07/14/2012   Procedure: PERCUTANEOUS CORONARY STENT INTERVENTION (PCI-S);  Surgeon: [name], MD;  Location: [facility] CATH LAB;  Service: Cardiovascular;  Laterality: Right;    Outpatient Medications Prior to Visit  Medication Sig Dispense Refill  . amLODipine (NORVASC) 10 MG tablet Take 10 mg by mouth daily.    Marland Kitchen aspirin 81 MG tablet Take 81 mg by mouth daily.      Marland Kitchen CREON 36000 units CPEP capsule TAKE 3 CAPSULES (108,000 UNITS TOTAL) BY MOUTH 3 (THREE) TIMES DAILY WITH MEALS. AND SNACKS 810 capsule 0  . glucose blood (ONE TOUCH ULTRA TEST) test strip 1 each by Other route 4 (four) times daily. And  lancets 4/day 120 each 12  . Insulin Glargine (LANTUS SOLOSTAR) 100 UNIT/ML Solostar Pen Inject 75 Units into the skin every morning. And pen needles 1/day 45 mL 4  . lisinopril (PRINIVIL,ZESTRIL) 20 MG tablet Take 1 tablet (20 mg total) by mouth 2 (two) times daily. 180 tablet 3  . Multiple Vitamin (MULTIVITAMIN) capsule Take 1 capsule by mouth daily. HOLD UNTIL ABLE TO SWALLOW PILLS WHOLE    . Nebivolol HCl (BYSTOLIC) 20 MG TABS Take 1 tablet (20 mg total) by mouth daily. 90  tablet 2  . omeprazole (PRILOSEC) 20 MG capsule Take 20 mg by mouth daily.    . simvastatin (ZOCOR) 40 MG tablet Take 1 tablet (40 mg total) by mouth at bedtime. 90 tablet 2  . spironolactone (ALDACTONE) 50 MG tablet Take 1 tablet (50 mg total) by mouth daily. 90 tablet 2   No facility-administered medications prior to visit.      Allergies:   Lipitor [atorvastatin]   Social History   Social History  . Marital status: Single    Spouse name: N/A  . Number of children: 0  . Years of education: N/A   Occupational History  . [employer]   .  [employer]   Social History Main Topics  . Smoking status: Never Smoker  . Smokeless tobacco: Never Used  . Alcohol use No  . Drug use: No  . Sexual activity: No   Other Topics Concern  . None   Social History Narrative  . None     Family History:  The patient's family history includes Diabetes in his maternal grandfather, mother, and paternal grandmother.   ROS:   Please see the history of present illness.    ROS All other systems reviewed and are negative.   PHYSICAL EXAM:   VS:  BP 120/83   Pulse 100   Ht 6' (1.829 m)   Wt 94.7 kg (208 lb 11.2 oz)   SpO2 100%   BMI 28.30 kg/m    GEN: Well nourished, well developed, in no acute distress  HEENT: normal  Neck: no JVD, carotid bruits, or masses Cardiac: RRR; no murmurs, rubs, or gallops,no edema  Respiratory:  clear to auscultation bilaterally, normal work of breathing GI: soft, nontender, nondistended, + BS MS: no deformity or atrophy  Skin: warm and dry, no rash Neuro:  Alert and Oriented x 3, Strength and sensation are intact Psych: euthymic mood, full affect  Wt Readings from Last 3 Encounters:  10/08/16 94.7 kg (208 lb 11.2 oz)  09/30/16 93 kg (205 lb)  07/22/16 92.5 kg (204 lb)      Studies/Labs Reviewed:   EKG:  EKG is not ordered today.  Recent Labs: 04/21/2016: ALT 18; BUN 28; Creatinine, Ser 1.27; Potassium 4.0; Sodium 137   Lipid Panel      Component Value Date/Time   CHOL 113 04/21/2016 0833   TRIG 143.0 04/21/2016 0833   HDL 27.10 (L) 04/21/2016 0833   CHOLHDL 4 04/21/2016 0833   VLDL 28.6 04/21/2016 0833   LDLCALC 57 04/21/2016 0833    ASSESSMENT:    1. Chronic systolic CHF (congestive heart failure), NYHA class 1 ([place])   2. Ischemic cardiomyopathy, EF 50% by echo 08/01/13   3. Essential hypertension   4. Coronary artery disease involving native coronary artery of native heart without angina pectoris   5. Mixed hyperlipidemia   6. 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 Pancreatic insufficiency  PLAN:  In order of problems listed above:  1. CHF: NYHA class I, euvolemic. Does not require loop diuretics, not symptomatic. He did not tolerate carvedilol well, possibly due to the very rapid onset of the medication, doing better on bystolic. 2. Isch CMP: Most recent assessment of ejection fraction showed improvement with EF 50%  3. HTN: Blood pressure seems to have settled in the more stable pattern and he no longer constantly self-adjusts his medications, as he did in the past. Likely that his episodes of low blood pressure could sometimes be secondary to glucosuria and dehydration. He should have a salty snack and plenty of water when that happens. Transient hypertension is a normal part of the adrenergic response to hypoglycemia 4. CAD: Angina free 5. HLP: Excellent lipid profile the exception of low HDL cholesterol. He is encouraged to become physically active again. 6. 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 Last hemoglobin A1c last week was much better at 8.2%, but still not at target 7. Pancreatic insufficiency might be contributing to less predictable exertion of nutrients and medications; this may explain some of the volatility and glycemic control, volume status and blood pressure.  Nino is very anxious. He requires constant  reassurance. Will keep his appointments on at least an every three-month basis.  Medication Adjustments/Labs and Tests Ordered: Current medicines are reviewed at length with the patient today.  Concerns regarding medicines are outlined above.  Medication changes, Labs and Tests ordered today are listed in the Patient Instructions below. Patient Instructions  Dr Sallyanne Kuster recommends that you schedule a follow-up appointment in 3 months.  If you need a refill on your cardiac medications before your next appointment, please call your pharmacy.   Signed, [name], MD  10/08/2016 6:24 PM    [address], [place], [place]  [address] Phone: [phone]; Fax: [phone]

## 2016-10-08 NOTE — Patient Instructions (Signed)
Dr Croitoru recommends that you schedule a follow-up appointment in 3 months.  If you need a refill on your cardiac medications before your next appointment, please call your pharmacy. 

## 2016-11-08 ENCOUNTER — Telehealth: Payer: Self-pay | Admitting: Endocrinology

## 2016-11-08 ENCOUNTER — Telehealth: Payer: Self-pay | Admitting: Cardiovascular Disease

## 2016-11-08 MED ORDER — AMLODIPINE BESYLATE 10 MG PO TABS: 10.0000 mg | ORAL_TABLET | Freq: Every day | ORAL | 3 refills | Status: DC

## 2016-11-08 MED ORDER — INSULIN GLARGINE 100 UNIT/ML SOLOSTAR PEN: 75.0000 [IU] | PEN_INJECTOR | SUBCUTANEOUS | 1 refills | Status: DC

## 2016-11-08 MED ORDER — LISINOPRIL 20 MG PO TABS
20.0000 mg | ORAL_TABLET | Freq: Two times a day (BID) | ORAL | 3 refills | Status: DC
Start: 2016-11-08 — End: 2017-12-25

## 2016-11-08 MED ORDER — OMEPRAZOLE 20 MG PO CPDR: 20.0000 mg | DELAYED_RELEASE_CAPSULE | Freq: Every day | ORAL | 3 refills | Status: DC

## 2016-11-08 MED ORDER — NEBIVOLOL HCL 20 MG PO TABS: 20.0000 mg | ORAL_TABLET | Freq: Every day | ORAL | 3 refills | Status: DC

## 2016-11-08 MED ORDER — SIMVASTATIN 40 MG PO TABS: 40.0000 mg | ORAL_TABLET | Freq: Every day | ORAL | 3 refills | Status: DC

## 2016-11-08 NOTE — Telephone Encounter (Signed)
Pt would like a 90 days supply of his Lantus sent into [facility] on [address].

## 2016-11-08 NOTE — Telephone Encounter (Signed)
Spironolactone refilled 10/08/16 Zocor, prilosec, bystolic, lisinopril, amlodipine refilled

## 2016-11-08 NOTE — Telephone Encounter (Signed)
David Lane is calling and states that he needs all his prescriptions to have a 90 day supply. He states that he spoke with Dr.Croitoru on the matter. His pharmacy is [facility] [address] [place], [place] [address]. [phone]. Thanks.

## 2016-11-08 NOTE — Telephone Encounter (Signed)
Refills submitted.  

## 2017-01-03 ENCOUNTER — Ambulatory Visit (INDEPENDENT_AMBULATORY_CARE_PROVIDER_SITE_OTHER): Payer: Medicare Other | Admitting: Endocrinology

## 2017-01-03 ENCOUNTER — Encounter: Payer: Self-pay | Admitting: Endocrinology

## 2017-01-03 VITALS — BP 126/76 | HR 73 | Ht 72.0 in | Wt 208.0 lb

## 2017-01-03 DIAGNOSIS — E089 Diabetes mellitus due to underlying condition without complications: Secondary | ICD-10-CM

## 2017-01-03 DIAGNOSIS — K8689 Other specified diseases of pancreas: Secondary | ICD-10-CM | POA: Diagnosis not present

## 2017-01-03 LAB — POCT GLYCOSYLATED HEMOGLOBIN (HGB A1C): Hemoglobin A1C: 8.8

## 2017-01-03 NOTE — Progress Notes (Signed)
Subjective:    Patient ID: David Lane, male    DOB: [birth year], 71 y.o.   MRN: [id number]  HPI Pt returns for f/u of diabetes mellitus: DM type: due to pancreatic insufficiency Dx'ed: 1988, during an episode of pancreatitis.   Complications: CAD, retinopathy, and polyneuropathy.  Therapy: insulin since 1990 DKA: never.   Severe hypoglycemia: last episode was in 2014; he says this was probably due to taking more than the prescribed insulin dosage.  Other: in December of 2014, he was changed to qd lantus, for further simplification of his insulin schedule.  V-GO pump was considered, but pt did not keep appt for it, and he later declined it; He is retired.   Interval history: He takes insulin as rx'ed.  pt states he feels well in general.  He has mild hypoglycemia approx once per month (60's).  He takes 80 units QD.  He brings a record of his cbg's which I have reviewed today.  It varies from 71-300.  There is no trend throughout the day.   Past Medical History:  Diagnosis Date  . CAD (coronary artery disease)    last cath by [facility] DR.  Mihai Croitoru showing  some  disease involving LCX and small size of Diag   . CHF exacerbation, due to diastolic dysfunction 71/24/5809  . Chronic systolic CHF (congestive heart failure), NYHA class 1 ([place]) 07/17/2012  . GERD (gastroesophageal reflux disease)   . Hepatic lesion 02/04/11  . Hyperlipemia   . Hypertension   . Ischemic cardiomyopathy    EF 35-40%  . Liver hemangioma   . NSTEMI (non-ST elevated myocardial infarction) ([place]) 11/21/2009  . Pancreatitis 2000's  . Shortness of breath   . Type II diabetes mellitus ([place])     Past Surgical History:  Procedure Laterality Date  . CARDIAC CATHETERIZATION  2011   minimal disease, medical management  . CORONARY ANGIOPLASTY WITH STENT PLACEMENT  07/14/2012   successful PCI & stenting of mid LAD & PDA off the dominant CX  . INCISION AND DRAINAGE ABSCESS ANAL  1970's  . LEFT HEART  CATHETERIZATION WITH CORONARY ANGIOGRAM N/A 07/14/2012   Procedure: LEFT HEART CATHETERIZATION WITH CORONARY ANGIOGRAM;  Surgeon: [name], MD;  Location: [facility] CATH LAB;  Service: Cardiovascular;  Laterality: N/A;  . PERCUTANEOUS CORONARY STENT INTERVENTION (PCI-S) Right 07/14/2012   Procedure: PERCUTANEOUS CORONARY STENT INTERVENTION (PCI-S);  Surgeon: [name], MD;  Location: [facility] CATH LAB;  Service: Cardiovascular;  Laterality: Right;    Social History   Social History  . Marital status: Single    Spouse name: N/A  . Number of children: 0  . Years of education: N/A   Occupational History  . [employer]   .  [employer]   Social History Main Topics  . Smoking status: Never Smoker  . Smokeless tobacco: Never Used  . Alcohol use No  . Drug use: No  . Sexual activity: No   Other Topics Concern  . Not on file   Social History Narrative  . No narrative on file    Current Outpatient Prescriptions on File Prior to Visit  Medication Sig Dispense Refill  . amLODipine (NORVASC) 10 MG tablet Take 1 tablet (10 mg total) by mouth daily. 90 tablet 3  . aspirin 81 MG tablet Take 81 mg by mouth daily.      Marland Kitchen CREON 36000 units CPEP capsule TAKE 3 CAPSULES (108,000 UNITS TOTAL) BY MOUTH 3 (THREE) TIMES DAILY WITH MEALS. AND SNACKS 810  capsule 0  . glucose blood (ONE TOUCH ULTRA TEST) test strip 1 each by Other route 4 (four) times daily. And lancets 4/day 120 each 12  . Insulin Glargine (LANTUS SOLOSTAR) 100 UNIT/ML Solostar Pen Inject 75 Units into the skin every morning. And pen needles 1/day 25 pen 1  . lisinopril (PRINIVIL,ZESTRIL) 20 MG tablet Take 1 tablet (20 mg total) by mouth 2 (two) times daily. 180 tablet 3  . Multiple Vitamin (MULTIVITAMIN) capsule Take 1 capsule by mouth daily. HOLD UNTIL ABLE TO SWALLOW PILLS WHOLE    . Nebivolol HCl (BYSTOLIC) 20 MG TABS Take 1 tablet (20 mg total) by mouth daily. 90 tablet 3  . omeprazole (PRILOSEC) 20 MG capsule Take 1 capsule  (20 mg total) by mouth daily. 90 capsule 3  . simvastatin (ZOCOR) 40 MG tablet Take 1 tablet (40 mg total) by mouth at bedtime. 90 tablet 3  . spironolactone (ALDACTONE) 50 MG tablet Take 1 tablet (50 mg total) by mouth daily. 90 tablet 3   No current facility-administered medications on file prior to visit.     Allergies  Allergen Reactions  . Lipitor [Atorvastatin] Other (See Comments)    Muscle weakness    Family History  Problem Relation Age of Onset  . Diabetes Mother   . Diabetes Paternal Grandmother   . Diabetes Maternal Grandfather   . Colon cancer Neg Hx     BP 126/76   Pulse 73   Ht 6' (1.829 m)   Wt 208 lb (94.3 kg)   SpO2 97%   BMI 28.21 kg/m    Review of Systems Denies LOC.     Objective:   Physical Exam VITAL SIGNS:  See vs page.  GENERAL: no distress.   Pulses: dorsalis pedis intact bilat.   MSK: no deformity of the feet CV: no leg edema Skin:  no ulcer on the feet.  normal color and temp on the feet.  Neuro: sensation is intact to touch on the feet, but decreased from normal.   PSYCH: anxious, talkative, and repetitive.    Lab Results  Component Value Date   HGBA1C 8.8 01/03/2017      Assessment & Plan:  Insulin-requiring type 2 DM, with retinopathy: worse.  Anxiety: this continues to limit our communication, and complicates the rx of DM.    Patient Instructions  Please take lantus, 75 each morning, every day.  On this type of insulin schedule, you should eat meals on a regular schedule.  If a meal is missed or significantly delayed, your blood sugar could go low. Please come back for a follow-up appointment in 3 months.   check your blood sugar twice a day.  vary the time of day when you check, between before the 3 meals, and at bedtime.  also check if you have symptoms of your blood sugar being too high or too low.  please keep a record of the readings and bring it to your next appointment here (or you can bring the meter itself).  You can  write it on any piece of paper.  please call us sooner if your blood sugar goes below 70, or if you have a lot of readings over 200.  It is important to write on the paper to write down why you think it is high or low that time.

## 2017-01-03 NOTE — Patient Instructions (Addendum)
Please take lantus, 75 each morning, every day.  On this type of insulin schedule, you should eat meals on a regular schedule.  If a meal is missed or significantly delayed, your blood sugar could go low. Please come back for a follow-up appointment in 3 months.   check your blood sugar twice a day.  vary the time of day when you check, between before the 3 meals, and at bedtime.  also check if you have symptoms of your blood sugar being too high or too low.  please keep a record of the readings and bring it to your next appointment here (or you can bring the meter itself).  You can write it on any piece of paper.  please call us sooner if your blood sugar goes below 70, or if you have a lot of readings over 200.  It is important to write on the paper to write down why you think it is high or low that time.

## 2017-01-07 ENCOUNTER — Encounter: Payer: Self-pay | Admitting: Cardiovascular Disease

## 2017-01-07 ENCOUNTER — Ambulatory Visit (INDEPENDENT_AMBULATORY_CARE_PROVIDER_SITE_OTHER): Payer: Medicare Other | Admitting: Cardiovascular Disease

## 2017-01-07 VITALS — BP 146/84 | HR 78 | Ht 72.0 in | Wt 206.0 lb

## 2017-01-07 DIAGNOSIS — I5042 Chronic combined systolic (congestive) and diastolic (congestive) heart failure: Secondary | ICD-10-CM

## 2017-01-07 DIAGNOSIS — K8689 Other specified diseases of pancreas: Secondary | ICD-10-CM | POA: Diagnosis not present

## 2017-01-07 DIAGNOSIS — I10 Essential (primary) hypertension: Secondary | ICD-10-CM

## 2017-01-07 DIAGNOSIS — E089 Diabetes mellitus due to underlying condition without complications: Secondary | ICD-10-CM

## 2017-01-07 DIAGNOSIS — I2510 Atherosclerotic heart disease of native coronary artery without angina pectoris: Secondary | ICD-10-CM

## 2017-01-07 DIAGNOSIS — Z79899 Other long term (current) drug therapy: Secondary | ICD-10-CM | POA: Diagnosis not present

## 2017-01-07 DIAGNOSIS — E782 Mixed hyperlipidemia: Secondary | ICD-10-CM

## 2017-01-07 MED ORDER — SIMVASTATIN 40 MG PO TABS: 40.0000 mg | ORAL_TABLET | Freq: Every day | ORAL | 3 refills | Status: DC

## 2017-01-07 NOTE — Progress Notes (Addendum)
Patient ID: David Lane, male   DOB: 02-07-1946, 71 y.o.   MRN: [id number] Patient ID: David Lane, male   DOB: August 19, 1946, 71 y.o.   MRN: [id number]    Cardiology Office Note    Date:  01/07/2017   ID:  David Lane, David Lane 1946-05-06, MRN [id number]  PCP:  [name], MD  Cardiologist:   [name], MD   chief complaint: BP management.   History of Present Illness:  David Lane is a 71 y.o. male with severe systemic hypertension and mild ischemic cardiomyopathy previously complicated by heart failure exacerbation. He has brittle diabetes mellitus following an episode of severe pancreatitis that caused both endocrine and exocrine pancreatic insufficiency.  As always he has a lot of anxieties, but generally Ray appears To be feeling well. He bemoans the fact that he retired and no longer feels useful and is "getting old" ; however to me he seems much better compensated emotionally since his retirement. He has been checking his blood pressure routinely, sometimes several times a day. For the most part blood pressure recordings are excellent in the 110/70-120/80 range. On two occasions he had systolic blood pressure of <100, not symptomatic. During a spell of hypoglycemia systolic blood pressure has increased to about 200, briefly. He has called the paramedics in 2 or 3 times, but on all occasions his blood pressure spikes resolved by the time they got there.  He has been eating a little too much of starchy foods. His glucose control has deteriorated. Just 2 days ago his A1c was 8.8%  He denies problems with shortness of breath at rest or with activity, orthopnea, PND, angina at rest or with exertion, palpitations, syncope or leg edema or claudication. He has not had recent problems with hypoglycemia.   Past Medical History:  Diagnosis Date  . CAD (coronary artery disease)    last cath by [facility] DR.  Kenzley Ke showing  some  disease involving LCX and small size of  Diag   . CHF exacerbation, due to diastolic dysfunction 14/97/0263  . Chronic systolic CHF (congestive heart failure), NYHA class 1 ([place]) 07/17/2012  . GERD (gastroesophageal reflux disease)   . Hepatic lesion 02/04/11  . Hyperlipemia   . Hypertension   . Ischemic cardiomyopathy    EF 35-40%  . Liver hemangioma   . NSTEMI (non-ST elevated myocardial infarction) ([place]) 11/21/2009  . Pancreatitis 2000's  . Shortness of breath   . Type II diabetes mellitus ([place])     Past Surgical History:  Procedure Laterality Date  . CARDIAC CATHETERIZATION  2011   minimal disease, medical management  . CORONARY ANGIOPLASTY WITH STENT PLACEMENT  07/14/2012   successful PCI & stenting of mid LAD & PDA off the dominant CX  . INCISION AND DRAINAGE ABSCESS ANAL  1970's  . LEFT HEART CATHETERIZATION WITH CORONARY ANGIOGRAM N/A 07/14/2012   Procedure: LEFT HEART CATHETERIZATION WITH CORONARY ANGIOGRAM;  Surgeon: [name], MD;  Location: [facility] CATH [facility];  Service: Cardiovascular;  Laterality: N/A;  . PERCUTANEOUS CORONARY STENT INTERVENTION (PCI-S) Right 07/14/2012   Procedure: PERCUTANEOUS CORONARY STENT INTERVENTION (PCI-S);  Surgeon: [name], MD;  Location: [facility] CATH LAB;  Service: Cardiovascular;  Laterality: Right;    Outpatient Medications Prior to Visit  Medication Sig Dispense Refill  . amLODipine (NORVASC) 10 MG tablet Take 1 tablet (10 mg total) by mouth daily. 90 tablet 3  . aspirin 81 MG tablet Take 81 mg by mouth daily.      Marland Kitchen  CREON 36000 units CPEP capsule TAKE 3 CAPSULES (108,000 UNITS TOTAL) BY MOUTH 3 (THREE) TIMES DAILY WITH MEALS. AND SNACKS 810 capsule 0  . glucose blood (ONE TOUCH ULTRA TEST) test strip 1 each by Other route 4 (four) times daily. And lancets 4/day 120 each 12  . Insulin Glargine (LANTUS SOLOSTAR) 100 UNIT/ML Solostar Pen Inject 75 Units into the skin every morning. And pen needles 1/day 25 pen 1  . lisinopril (PRINIVIL,ZESTRIL) 20 MG tablet Take 1 tablet (20 mg  total) by mouth 2 (two) times daily. 180 tablet 3  . Multiple Vitamin (MULTIVITAMIN) capsule Take 1 capsule by mouth daily. HOLD UNTIL ABLE TO SWALLOW PILLS WHOLE    . Nebivolol HCl (BYSTOLIC) 20 MG TABS Take 1 tablet (20 mg total) by mouth daily. 90 tablet 3  . omeprazole (PRILOSEC) 20 MG capsule Take 1 capsule (20 mg total) by mouth daily. 90 capsule 3  . spironolactone (ALDACTONE) 50 MG tablet Take 1 tablet (50 mg total) by mouth daily. 90 tablet 3  . simvastatin (ZOCOR) 40 MG tablet Take 1 tablet (40 mg total) by mouth at bedtime. 90 tablet 3   No facility-administered medications prior to visit.      Allergies:   Lipitor [atorvastatin]   Social History   Social History  . Marital status: Single    Spouse name: N/A  . Number of children: 0  . Years of education: N/A   Occupational History  . [employer]   .  [employer]   Social History Main Topics  . Smoking status: Never Smoker  . Smokeless tobacco: Never Used  . Alcohol use No  . Drug use: No  . Sexual activity: No   Other Topics Concern  . None   Social History Narrative  . None     Family History:  The patient's family history includes Diabetes in his maternal grandfather, mother, and paternal grandmother.   ROS:   Please see the history of present illness.    ROS All other systems reviewed and are negative.   PHYSICAL EXAM:   VS:  BP (!) 146/84   Pulse 78   Ht 6' (1.829 m)   Wt 93.4 kg (206 lb)   BMI 27.94 kg/m     Recheck blood pressure 10 minutes later 119/76 GEN: Well nourished, well developed, in no acute distress  HEENT: normal  Neck: no JVD, carotid bruits, or masses Cardiac: RRR; no murmurs, rubs, or gallops,no edema  Respiratory:  clear to auscultation bilaterally, normal work of breathing GI: soft, nontender, nondistended, + BS MS: no deformity or atrophy  Skin: warm and dry, no rash Neuro:  Alert and Oriented x 3, Strength and sensation are intact Psych: euthymic mood, full  affect  Wt Readings from Last 3 Encounters:  01/07/17 93.4 kg (206 lb)  01/03/17 94.3 kg (208 lb)  10/08/16 94.7 kg (208 lb 11.2 oz)      Studies/Labs Reviewed:   EKG:  EKG is ordered today. It shows sinus rhythm with first-degree AV block (PR interval 226 ms), old septal infarct, otherwise normal   Recent Labs: 04/21/2016: ALT 18; BUN 28; Creatinine, Ser 1.27; Potassium 4.0; Sodium 137   Lipid Panel    Component Value Date/Time   CHOL 113 04/21/2016 0833   TRIG 143.0 04/21/2016 0833   HDL 27.10 (L) 04/21/2016 0833   CHOLHDL 4 04/21/2016 0833   VLDL 28.6 04/21/2016 0833   LDLCALC 57 04/21/2016 0833    ASSESSMENT:  1. Chronic combined systolic and diastolic CHF (congestive heart failure) ([place])   2. Essential hypertension   3. Coronary artery disease involving native coronary artery of native heart without angina pectoris   4. Mixed hyperlipidemia   5. 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 Pancreatic insufficiency   7. Medication management      PLAN:  In order of problems listed above:  1. CHF: NYHA class I, euvolemic. Does not require loop diuretics, not symptomatic. Ischemic and hypertensive cardiomyopathy. He did not tolerate carvedilol well, possibly due to the very rapid onset of the medication, doing better on bystolic.Most recent assessment of ejection fraction showed improvement with EF 50%  2. HTN: Blood pressure seems to have settled in the more stable pattern and he no longer constantly self-adjusts his medications, as he did in the past. Always, Ray is worried about the occasional mild elevation or dip in systolic blood pressure, but on average she has excellent blood pressure control 3. CAD: Angina free 4. HLP: Excellent lipid profile the exception of low HDL cholesterol. He is encouraged to become physically active again. 5. 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 Last  hemoglobin A1c last week was worse at 8.8%, at least in part due to worsening dietary indiscretion. 6. Pancreatic insufficiency might be contributing to less predictable exertion of nutrients and medications; this may explain some of the volatility and glycemic control, volume status and blood pressure.  Dartanyan is very anxious. He requires constant reassurance. Will keep his appointments on at least an every three-month basis.  Medication Adjustments/Labs and Tests Ordered: Current medicines are reviewed at length with the patient today.  Concerns regarding medicines are outlined above.  Medication changes, Labs and Tests ordered today are listed in the Patient Instructions below. Patient Instructions  Dr Sallyanne Kuster recommends that you schedule a follow-up appointment in 3 months.  If you need a refill on your cardiac medications before your next appointment, please call your pharmacy.  Your physician recommends that you return for lab work in 3 months - FASTING. Please have labs completed prior to your follow-up appointment.   Signed, [name], MD  01/07/2017 10:11 AM    [facility] [address], [place], [place]  [address] Phone: [phone]; Fax: [phone]

## 2017-01-07 NOTE — Patient Instructions (Addendum)
Dr Sallyanne Kuster recommends that you schedule a follow-up appointment in 3 months.  If you need a refill on your cardiac medications before your next appointment, please call your pharmacy.  Your physician recommends that you return for lab work in 3 months - FASTING. Please have labs completed prior to your follow-up appointment.

## 2017-01-26 ENCOUNTER — Telehealth: Payer: Self-pay | Admitting: Endocrinology

## 2017-01-26 NOTE — Telephone Encounter (Signed)
Pt is asking if we can call his pharmacy to get some understanding of what is happening with the cost of all of his meds. Please call [facility] [address] which is now [facility]

## 2017-01-28 NOTE — Telephone Encounter (Signed)
Left patient a vm explaining he would need to call his insurance company and find out why his cost has gone up

## 2017-02-22 ENCOUNTER — Telehealth: Payer: Self-pay | Admitting: Endocrinology

## 2017-02-22 MED ORDER — GLUCOSE BLOOD VI STRP: 1.0000 | ORAL_STRIP | Freq: Four times a day (QID) | 12 refills | Status: DC

## 2017-02-22 NOTE — Telephone Encounter (Signed)
Refill submitted. 

## 2017-02-22 NOTE — Telephone Encounter (Signed)
**  Remind patient they can make refill requests via MyChart**  Medication refill request (Name & Dosage):  glucose blood (ONE TOUCH ULTRA TEST) test strip  Preferred pharmacy (Name & Address):  [id number] [place], [place] [phone] (Phone) [phone] (Fax)      Other comments (if applicable):    Patient is there now. Please fill as soon as possible.

## 2017-02-23 ENCOUNTER — Other Ambulatory Visit: Payer: Self-pay

## 2017-02-23 MED ORDER — GLUCOSE BLOOD VI STRP: 1.0000 | ORAL_STRIP | Freq: Four times a day (QID) | 12 refills | Status: DC

## 2017-02-24 ENCOUNTER — Telehealth: Payer: Self-pay | Admitting: Endocrinology

## 2017-02-24 NOTE — Telephone Encounter (Signed)
Patient went to get test strips from the pharmacy and was advised that the pharmacy was bought out from [facility] and no longer provides test strips.   Patient went to  [id number] - [place], [place] [phone] (Phone) [phone] (Fax)   But, they are requesting a verbal approval or prior authorization before filling.   Patient would like to know all that Medicare covers aside from the test strips.   Please call patient today to advise, he is out of his strips.

## 2017-02-24 NOTE — Telephone Encounter (Signed)
error 

## 2017-02-24 NOTE — Telephone Encounter (Signed)
I need to know what brand is preferred.

## 2017-03-02 ENCOUNTER — Other Ambulatory Visit: Payer: Self-pay

## 2017-03-02 MED ORDER — GLUCOSE BLOOD VI STRP: 1.0000 | ORAL_STRIP | Freq: Four times a day (QID) | 12 refills | Status: DC

## 2017-03-02 NOTE — Telephone Encounter (Signed)
Ordered - one-touch ultra just needed to [facility]

## 2017-03-03 NOTE — Telephone Encounter (Signed)
Noted  

## 2017-03-03 NOTE — Telephone Encounter (Signed)
Patient called to check the status of the rx. I advised that Lattie Haw had placed the order to the [facility]. Nothing further needed.

## 2017-03-07 DIAGNOSIS — E119 Type 2 diabetes mellitus without complications: Secondary | ICD-10-CM | POA: Diagnosis not present

## 2017-03-09 ENCOUNTER — Telehealth: Payer: Self-pay | Admitting: Endocrinology

## 2017-03-09 ENCOUNTER — Other Ambulatory Visit: Payer: Self-pay

## 2017-03-09 MED ORDER — GLUCOSE BLOOD VI STRP: 1.0000 | ORAL_STRIP | Freq: Four times a day (QID) | 12 refills | Status: DC

## 2017-03-09 NOTE — Telephone Encounter (Signed)
I contacted the patient and advised the rx was submitted on 03/02/2017 and received at 317 pm. Patient advised we would resubmit the prescription today.

## 2017-03-09 NOTE — Telephone Encounter (Signed)
Patient called to request that you call [facility] to make sure they received glucose blood (ONE TOUCH ULTRA TEST) test strip [[id number]]  . He states that he tried to call them, and they placed him on hold for over 30 minutes and never confirmed that they had the prescription.

## 2017-03-23 ENCOUNTER — Telehealth: Payer: Self-pay | Admitting: Endocrinology

## 2017-03-23 NOTE — Telephone Encounter (Signed)
**  Remind patient they can make refill requests via MyChart**  Medication refill request (Name & Dosage): Insulin Glargine (LANTUS SOLOSTAR) 100 UNIT/ML Solostar Pen [[id number]]     Preferred pharmacy (Name & Address):   [place], [place]. [phone] (Phone) [phone] (Fax)       Other comments (if applicable):   Patient also needs more test strips, but states he uses Accu-Check strips not one touch. Patient would also like all prescriptions sent to [facility] moving forward.

## 2017-03-24 MED ORDER — GLUCOSE BLOOD VI STRP: ORAL_STRIP | 12 refills | Status: DC

## 2017-03-24 MED ORDER — INSULIN GLARGINE 100 UNIT/ML SOLOSTAR PEN: 75.0000 [IU] | PEN_INJECTOR | SUBCUTANEOUS | 1 refills | Status: DC

## 2017-03-24 NOTE — Addendum Note (Signed)
Addended by: Verlin Grills T on: 03/24/2017 09:07 AM   Modules accepted: Orders

## 2017-03-24 NOTE — Telephone Encounter (Signed)
Refill submitted. 

## 2017-04-04 ENCOUNTER — Telehealth: Payer: Self-pay | Admitting: Cardiovascular Disease

## 2017-04-04 ENCOUNTER — Ambulatory Visit (INDEPENDENT_AMBULATORY_CARE_PROVIDER_SITE_OTHER): Payer: Medicare Other | Admitting: Endocrinology

## 2017-04-04 ENCOUNTER — Encounter: Payer: Self-pay | Admitting: Endocrinology

## 2017-04-04 VITALS — BP 134/82 | HR 78 | Ht 72.0 in | Wt 207.0 lb

## 2017-04-04 DIAGNOSIS — E089 Diabetes mellitus due to underlying condition without complications: Secondary | ICD-10-CM

## 2017-04-04 DIAGNOSIS — K8689 Other specified diseases of pancreas: Secondary | ICD-10-CM

## 2017-04-04 LAB — POCT GLYCOSYLATED HEMOGLOBIN (HGB A1C): HEMOGLOBIN A1C: 9

## 2017-04-04 MED ORDER — INSULIN GLARGINE 100 UNIT/ML SOLOSTAR PEN: 85.0000 [IU] | PEN_INJECTOR | SUBCUTANEOUS | 1 refills | Status: DC

## 2017-04-04 NOTE — Telephone Encounter (Signed)
Returning your call. °

## 2017-04-04 NOTE — Telephone Encounter (Signed)
New message    Pt is calling asking for a call back about his labs.

## 2017-04-04 NOTE — Telephone Encounter (Signed)
Phone rang and then there was a busy signal

## 2017-04-04 NOTE — Telephone Encounter (Signed)
S/w pt states that no one told him that his blood work is fasting so they would not draw lab. Pt states that he cannot come in until the afternoon tomorrow and he will try to fast but he does not think that he will be able. Pt states that he does not know if he should come to the appt anyway and have lab drawn the day of the appt or should he reschedule his appt? Please advise

## 2017-04-04 NOTE — Patient Instructions (Addendum)
Please increase the lantus to 85 each morning, every morning.  Please eat a light snack at bedtime, to prevent it from going low in the early hrs of the morning.  On this type of insulin schedule, you should eat meals on a regular schedule.  If a meal is missed or significantly delayed, your blood sugar could go low.  Please come back for a follow-up appointment in 2 months.   check your blood sugar twice a day.  vary the time of day when you check, between before the 3 meals, and at bedtime.  also check if you have symptoms of your blood sugar being too high or too low.  please keep a record of the readings and bring it to your next appointment here (or you can bring the meter itself).  You can write it on any piece of paper.  please call us sooner if your blood sugar goes below 70, or if you have a lot of readings over 200.  It is important to write on the paper to write down why you think it is high or low that time.

## 2017-04-04 NOTE — Progress Notes (Signed)
Subjective:    Patient ID: David Lane, male    DOB: 04-Jun-1946, 71 y.o.   MRN: [id number]  HPI Pt returns for f/u of diabetes mellitus: DM type: due to pancreatic insufficiency Dx'ed: 1988, during an episode of pancreatitis.   Complications: CAD, retinopathy, PAD, and polyneuropathy.  Therapy: insulin since 1990 DKA: never.   Severe hypoglycemia: last episode was in 2014; he says this was probably due to taking more than the prescribed insulin dosage.  Other: in December of 2014, he was changed to qd lantus, for further simplification of his insulin schedule.  V-GO pump was considered, but pt did not keep appt for it, and he later declined it; He is retired.   Interval history: He takes insulin as rx'ed.  pt states he feels well in general.  He has mild hypoglycemia approx once per month (60's).  He takes 80 units QD.  He brings a record of his cbg's which I have reviewed today.  It varies from 60-366.  It is in general higher as the day goes on, but not necessarily so.   Past Medical History:  Diagnosis Date  . CAD (coronary artery disease)    last cath by [facility] DR.  Mihai Croitoru showing  some  disease involving LCX and small size of Diag   . CHF exacerbation, due to diastolic dysfunction 02/40/9735  . Chronic systolic CHF (congestive heart failure), NYHA class 1 ([place]) 07/17/2012  . GERD (gastroesophageal reflux disease)   . Hepatic lesion 02/04/11  . Hyperlipemia   . Hypertension   . Ischemic cardiomyopathy    EF 35-40%  . Liver hemangioma   . NSTEMI (non-ST elevated myocardial infarction) ([place]) 11/21/2009  . Pancreatitis 2000's  . Shortness of breath   . Type II diabetes mellitus ([place])     Past Surgical History:  Procedure Laterality Date  . CARDIAC CATHETERIZATION  2011   minimal disease, medical management  . CORONARY ANGIOPLASTY WITH STENT PLACEMENT  07/14/2012   successful PCI & stenting of mid LAD & PDA off the dominant CX  . INCISION AND DRAINAGE ABSCESS ANAL   1970's  . LEFT HEART CATHETERIZATION WITH CORONARY ANGIOGRAM N/A 07/14/2012   Procedure: LEFT HEART CATHETERIZATION WITH CORONARY ANGIOGRAM;  Surgeon: [name], MD;  Location: [facility] CATH LAB;  Service: Cardiovascular;  Laterality: N/A;  . PERCUTANEOUS CORONARY STENT INTERVENTION (PCI-S) Right 07/14/2012   Procedure: PERCUTANEOUS CORONARY STENT INTERVENTION (PCI-S);  Surgeon: [name], MD;  Location: [facility] CATH LAB;  Service: Cardiovascular;  Laterality: Right;    Social History   Social History  . Marital status: Single    Spouse name: N/A  . Number of children: 0  . Years of education: N/A   Occupational History  . [employer]   .  [employer]   Social History Main Topics  . Smoking status: Never Smoker  . Smokeless tobacco: Never Used  . Alcohol use No  . Drug use: No  . Sexual activity: No   Other Topics Concern  . Not on file   Social History Narrative  . No narrative on file    Current Outpatient Prescriptions on File Prior to Visit  Medication Sig Dispense Refill  . amLODipine (NORVASC) 10 MG tablet Take 1 tablet (10 mg total) by mouth daily. 90 tablet 3  . aspirin 81 MG tablet Take 81 mg by mouth daily.      Marland Kitchen CREON 36000 units CPEP capsule TAKE 3 CAPSULES (108,000 UNITS TOTAL) BY MOUTH 3 (  THREE) TIMES DAILY WITH MEALS. AND SNACKS 810 capsule 0  . glucose blood (ACCU-CHEK AVIVA) test strip 4 (four) times daily. DX code e11.9 100 each 12  . lisinopril (PRINIVIL,ZESTRIL) 20 MG tablet Take 1 tablet (20 mg total) by mouth 2 (two) times daily. 180 tablet 3  . Multiple Vitamin (MULTIVITAMIN) capsule Take 1 capsule by mouth daily. HOLD UNTIL ABLE TO SWALLOW PILLS WHOLE    . Nebivolol HCl (BYSTOLIC) 20 MG TABS Take 1 tablet (20 mg total) by mouth daily. 90 tablet 3  . omeprazole (PRILOSEC) 20 MG capsule Take 1 capsule (20 mg total) by mouth daily. 90 capsule 3  . simvastatin (ZOCOR) 40 MG tablet Take 1 tablet (40 mg total) by mouth at bedtime. 90 tablet 3  .  spironolactone (ALDACTONE) 50 MG tablet Take 1 tablet (50 mg total) by mouth daily. 90 tablet 3   No current facility-administered medications on file prior to visit.     Allergies  Allergen Reactions  . Lipitor [Atorvastatin] Other (See Comments)    Muscle weakness    Family History  Problem Relation Age of Onset  . Diabetes Mother   . Diabetes Paternal Grandmother   . Diabetes Maternal Grandfather   . Colon cancer Neg Hx     BP 134/82   Pulse 78   Ht 6' (1.829 m)   Wt 207 lb (93.9 kg)   SpO2 96%   BMI 28.07 kg/m    Review of Systems Denies LOC.      Objective:   Physical Exam VITAL SIGNS:  See vs page.  GENERAL: no distress.   Pulses: dorsalis pedis intact bilat.   MSK: no deformity of the feet CV: no leg edema Skin:  no ulcer on the feet.  normal color and temp on the feet.  Neuro: sensation is intact to touch on the feet, but decreased from normal.   Ext: both great toenails are ingrown, but no erythema/swell/tend.     A1c=9.0%    Assessment & Plan:  Insulin-requiring type 2 DM, with PAD: worse Hypoglycemia, mild.  In general eating HS snack to prevent early am hypoglycemia is not advised, but this is the best option available.    Patient Instructions  Please increase the lantus to 85 each morning, every morning.  Please eat a light snack at bedtime, to prevent it from going low in the early hrs of the morning.  On this type of insulin schedule, you should eat meals on a regular schedule.  If a meal is missed or significantly delayed, your blood sugar could go low.  Please come back for a follow-up appointment in 2 months.   check your blood sugar twice a day.  vary the time of day when you check, between before the 3 meals, and at bedtime.  also check if you have symptoms of your blood sugar being too high or too low.  please keep a record of the readings and bring it to your next appointment here (or you can bring the meter itself).  You can write it on any  piece of paper.  please call us sooner if your blood sugar goes below 70, or if you have a lot of readings over 200.  It is important to write on the paper to write down why you think it is high or low that time.

## 2017-04-05 NOTE — Telephone Encounter (Signed)
Just keep appointment as usual. Get blood work done that day she chooses or another day. I'll just call him with the results

## 2017-04-05 NOTE — Telephone Encounter (Signed)
Pt.notified

## 2017-04-07 ENCOUNTER — Encounter: Payer: Self-pay | Admitting: Cardiovascular Disease

## 2017-04-07 ENCOUNTER — Ambulatory Visit (INDEPENDENT_AMBULATORY_CARE_PROVIDER_SITE_OTHER): Payer: Medicare Other | Admitting: Cardiovascular Disease

## 2017-04-07 VITALS — BP 106/72 | HR 76 | Ht 72.0 in | Wt 209.0 lb

## 2017-04-07 DIAGNOSIS — I5042 Chronic combined systolic (congestive) and diastolic (congestive) heart failure: Secondary | ICD-10-CM | POA: Diagnosis not present

## 2017-04-07 DIAGNOSIS — I2510 Atherosclerotic heart disease of native coronary artery without angina pectoris: Secondary | ICD-10-CM

## 2017-04-07 DIAGNOSIS — E089 Diabetes mellitus due to underlying condition without complications: Secondary | ICD-10-CM

## 2017-04-07 DIAGNOSIS — E782 Mixed hyperlipidemia: Secondary | ICD-10-CM

## 2017-04-07 DIAGNOSIS — I10 Essential (primary) hypertension: Secondary | ICD-10-CM

## 2017-04-07 DIAGNOSIS — K8689 Other specified diseases of pancreas: Secondary | ICD-10-CM

## 2017-04-07 NOTE — Patient Instructions (Signed)
Dr Croitoru recommends that you schedule a follow-up appointment in 3 months.  If you need a refill on your cardiac medications before your next appointment, please call your pharmacy. 

## 2017-04-08 NOTE — Progress Notes (Signed)
Patient ID: David Lane, male   DOB: 1945-11-21, 71 y.o.   MRN: [id number] Patient ID: David Lane, male   DOB: December 10, 1945, 71 y.o.   MRN: [id number]    Cardiology Office Note    Date:  04/08/2017   ID:  David Lane [birth year], MRN [id number]  PCP:  Dorena Cookey, MD  Cardiologist:   [name], MD   chief complaint: BP management.   History of Present Illness:  David Lane is a 71 y.o. male with severe systemic hypertension and mild ischemic cardiomyopathy previously complicated by heart failure exacerbation. He has brittle diabetes mellitus following an episode of severe pancreatitis that caused both endocrine and exocrine pancreatic insufficiency.  He seems to be well adjusted both physically and emotionally. He is less anxious than usual. He has noticed that since he retired he is gaining weight, his glycemic control is slowly deteriorating, and he is becoming more "flabby". He is planning to enroll in a gym. He has not had problems with syncope or severe hypoglycemia, chest pain, shortness of breath, palpitations, leg edema, claudication or focal neurological complaints.  His most recent hemoglobin A1c was up to 9%. .   Past Medical History:  Diagnosis Date  . CAD (coronary artery disease)    last cath by [facility] DR.  Aeliana Spates showing  some  disease involving LCX and small size of Diag   . CHF exacerbation, due to diastolic dysfunction 95/18/8416  . Chronic systolic CHF (congestive heart failure), NYHA class 1 ([place]) 07/17/2012  . GERD (gastroesophageal reflux disease)   . Hepatic lesion 02/04/11  . Hyperlipemia   . Hypertension   . Ischemic cardiomyopathy    EF 35-40%  . Liver hemangioma   . NSTEMI (non-ST elevated myocardial infarction) ([place]) 11/21/2009  . Pancreatitis 2000's  . Shortness of breath   . Type II diabetes mellitus ([place])     Past Surgical History:  Procedure Laterality Date  . CARDIAC CATHETERIZATION  2011   minimal disease,  medical management  . CORONARY ANGIOPLASTY WITH STENT PLACEMENT  07/14/2012   successful PCI & stenting of mid LAD & PDA off the dominant CX  . INCISION AND DRAINAGE ABSCESS ANAL  1970's  . LEFT HEART CATHETERIZATION WITH CORONARY ANGIOGRAM N/A 07/14/2012   Procedure: LEFT HEART CATHETERIZATION WITH CORONARY ANGIOGRAM;  Surgeon: [name], MD;  Location: [facility] CATH LAB;  Service: Cardiovascular;  Laterality: N/A;  . PERCUTANEOUS CORONARY STENT INTERVENTION (PCI-S) Right 07/14/2012   Procedure: PERCUTANEOUS CORONARY STENT INTERVENTION (PCI-S);  Surgeon: [name], MD;  Location: [facility] C[facility] LAB;  Service: Cardiovascular;  Laterality: Right;    Outpatient Medications Prior to Visit  Medication Sig Dispense Refill  . amLODipine (NORVASC) 10 MG tablet Take 1 tablet (10 mg total) by mouth daily. 90 tablet 3  . aspirin 81 MG tablet Take 81 mg by mouth daily.      Marland Kitchen CREON 36000 units CPEP capsule TAKE 3 CAPSULES (108,000 UNITS TOTAL) BY MOUTH 3 (THREE) TIMES DAILY WITH MEALS. AND SNACKS 810 capsule 0  . glucose blood (ACCU-CHEK AVIVA) test strip 4 (four) times daily. DX code e11.9 100 each 12  . Insulin Glargine (LANTUS SOLOSTAR) 100 UNIT/ML Solostar Pen Inject 85 Units into the skin every morning. And pen needles 1/day 25 pen 1  . lisinopril (PRINIVIL,ZESTRIL) 20 MG tablet Take 1 tablet (20 mg total) by mouth 2 (two) times daily. 180 tablet 3  . Multiple Vitamin (MULTIVITAMIN) capsule Take 1 capsule by mouth  daily. HOLD UNTIL ABLE TO SWALLOW PILLS WHOLE    . Nebivolol HCl (BYSTOLIC) 20 MG TABS Take 1 tablet (20 mg total) by mouth daily. 90 tablet 3  . omeprazole (PRILOSEC) 20 MG capsule Take 1 capsule (20 mg total) by mouth daily. 90 capsule 3  . simvastatin (ZOCOR) 40 MG tablet Take 1 tablet (40 mg total) by mouth at bedtime. 90 tablet 3  . spironolactone (ALDACTONE) 50 MG tablet Take 1 tablet (50 mg total) by mouth daily. 90 tablet 3   No facility-administered medications prior to visit.        Allergies:   Lipitor [atorvastatin]   Social History   Social History  . Marital status: Single    Spouse name: N/A  . Number of children: 0  . Years of education: N/A   Occupational History  . [employer]   .  [employer]   Social History Main Topics  . Smoking status: Never Smoker  . Smokeless tobacco: Never Used  . Alcohol use No  . Drug use: No  . Sexual activity: No   Other Topics Concern  . None   Social History Narrative  . None     Family History:  The patient's family history includes Diabetes in his maternal grandfather, mother, and paternal grandmother.   ROS:   Please see the history of present illness.    ROS All other systems reviewed and are negative.   PHYSICAL EXAM:   VS:  BP 106/72   Pulse 76   Ht 6' (1.829 m)   Wt 209 lb (94.8 kg)   BMI 28.35 kg/m      General: Alert, oriented x3, no distress Head: no evidence of trauma, PERRL, EOMI, no exophtalmos or lid lag, no myxedema, no xanthelasma; normal ears, nose and oropharynx Neck: normal jugular venous pulsations and no hepatojugular reflux; brisk carotid pulses without delay and no carotid bruits Chest: clear to auscultation, no signs of consolidation by percussion or palpation, normal fremitus, symmetrical and full respiratory excursions Cardiovascular: normal position and quality of the apical impulse, regular rhythm, normal first and second heart sounds, no murmurs, rubs or gallops Abdomen: no tenderness or distention, no masses by palpation, no abnormal pulsatility or arterial bruits, normal bowel sounds, no hepatosplenomegaly Extremities: no clubbing, cyanosis or edema; 2+ radial, ulnar and brachial pulses bilaterally; 2+ right femoral, posterior tibial and dorsalis pedis pulses; 2+ left femoral, posterior tibial and dorsalis pedis pulses; no subclavian or femoral bruits Neurological: grossly nonfocal   Wt Readings from Last 3 Encounters:  04/07/17 209 lb (94.8 kg)  04/04/17 207 lb  (93.9 kg)  01/07/17 206 lb (93.4 kg)      Studies/Labs Reviewed:   EKG:  EKG is not ordered today. Recent Labs: 04/21/2016: ALT 18; BUN 28; Creatinine, Ser 1.27; Potassium 4.0; Sodium 137   Lipid Panel    Component Value Date/Time   CHOL 113 04/21/2016 0833   TRIG 143.0 04/21/2016 0833   HDL 27.10 (L) 04/21/2016 0833   CHOLHDL 4 04/21/2016 0833   VLDL 28.6 04/21/2016 0833   LDLCALC 57 04/21/2016 0833    ASSESSMENT:    1. Chronic combined systolic and diastolic CHF (congestive heart failure) ([place])   2. Essential hypertension   3. Coronary artery disease involving native coronary artery of native heart without angina pectoris   4. Mixed hyperlipidemia   5. 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 Pancreatic insufficiency      PLAN:  In order  of problems listed above:  1. CHF: NYHA class I, euvolemic. Does not require loop diuretics, not symptomatic. On beta blocker, spironolactone, ACE inhibitor. Mixed ischemic and hypertensive cardiomyopathy. Most recent assessment of ejection fraction showed improvement with EF 50%  2. HTN: Excellent control and no symptoms of excessive drop in blood pressure. 3. CAD: Angina free. 4. HLP: LDL cholesterol is excellent. HDL is low and this probably correlates with deteriorating glycemic control 5. 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 I encouraged him in his plans to start more regular more intense physical activity. 6. Pancreatic insufficiency might be contributing to less predictable exertion of nutrients and medications; this may explain some of the volatility and glycemic control, volume status and blood pressure.  Lateef is very anxious. He requires constant reassurance. Will keep his appointments on at least an every three-month basis.  Medication Adjustments/Labs and Tests Ordered: Current medicines are reviewed at length with the patient today.   Concerns regarding medicines are outlined above.  Medication changes, Labs and Tests ordered today are listed in the Patient Instructions below. Patient Instructions  Dr Sallyanne Kuster recommends that you schedule a follow-up appointment in 3 months.  If you need a refill on your cardiac medications before your next appointment, please call your pharmacy.   Signed, [name], MD  04/08/2017 3:48 PM    [address], [place], [place]  [address] Phone: [phone]; Fax: [phone]

## 2017-05-06 ENCOUNTER — Other Ambulatory Visit: Payer: Self-pay | Admitting: Cardiovascular Disease

## 2017-05-06 NOTE — Telephone Encounter (Signed)
New message    *STAT* If patient is at the pharmacy, call can be transferred to refill team.   1. Which medications need to be refilled? (please list name of each medication and dose if known) amLODipine (NORVASC) 10 MG tablet  2. Which pharmacy/location (including street and city if local pharmacy) is medication to be sent to? [facility] [phone] [place]  3. Do they need a 30 day or 90 day supply? 30 day supply

## 2017-05-09 NOTE — Telephone Encounter (Signed)
Called patient to see if he needed a refill on his Amlodipine. He does not need a refill at this current time he needed a refill before going out of town and his pharmacy was able to accommodated him before his trip. He states he has what he needs and will call the office when he needs a refill in the near future.

## 2017-06-06 ENCOUNTER — Ambulatory Visit: Payer: Medicare Other | Admitting: Endocrinology

## 2017-06-08 ENCOUNTER — Encounter: Payer: Self-pay | Admitting: Endocrinology

## 2017-06-08 ENCOUNTER — Ambulatory Visit (INDEPENDENT_AMBULATORY_CARE_PROVIDER_SITE_OTHER): Payer: Medicare Other | Admitting: Endocrinology

## 2017-06-08 VITALS — BP 124/72 | HR 72 | Wt 210.6 lb

## 2017-06-08 DIAGNOSIS — K8689 Other specified diseases of pancreas: Secondary | ICD-10-CM

## 2017-06-08 DIAGNOSIS — E089 Diabetes mellitus due to underlying condition without complications: Secondary | ICD-10-CM

## 2017-06-08 LAB — POCT GLYCOSYLATED HEMOGLOBIN (HGB A1C): HEMOGLOBIN A1C: 9.2

## 2017-06-08 MED ORDER — INSULIN NPH (HUMAN) (ISOPHANE) 100 UNIT/ML ~~LOC~~ SUSP: SUBCUTANEOUS | 11 refills | Status: DC

## 2017-06-08 NOTE — Progress Notes (Signed)
Subjective:    Patient ID: David Lane, male    DOB: [birth year], 71 y.o.   MRN: [id number]  HPI Pt returns for f/u of diabetes mellitus: DM type: due to pancreatic insufficiency Dx'ed: 1988, during an episode of pancreatitis.   Complications: CAD, retinopathy, PAD, and polyneuropathy.  Therapy: insulin since 1990 DKA: never.   Severe hypoglycemia: last episode was in 2014; he says this was probably due to taking more than the prescribed insulin dosage.  Other: in 2014, he was changed to qd lantus, for further simplification of his insulin schedule.  V-GO pump was considered, but pt did not keep appt for it, and he later declined it; He is retired.   Interval history: He takes insulin as rx'ed.  pt states he feels well in general.  He has mild hypoglycemia approx once per week.  He takes 85 units QD.  He brings a record of his cbg's which I have reviewed today.  It varies from 60-577.  There is no trend throughout the day. He says he can no longer afford name-brand insulin.  Past Medical History:  Diagnosis Date  . CAD (coronary artery disease)    last cath by [facility] DR.  Mihai Croitoru showing  some  disease involving LCX and small size of Diag   . CHF exacerbation, due to diastolic dysfunction 03/50/0938  . Chronic systolic CHF (congestive heart failure), NYHA class 1 ([place]) 07/17/2012  . GERD (gastroesophageal reflux disease)   . Hepatic lesion 02/04/11  . Hyperlipemia   . Hypertension   . Ischemic cardiomyopathy    EF 35-40%  . Liver hemangioma   . NSTEMI (non-ST elevated myocardial infarction) ([place]) 11/21/2009  . Pancreatitis 2000's  . Shortness of breath   . Type II diabetes mellitus ([place])     Past Surgical History:  Procedure Laterality Date  . CARDIAC CATHETERIZATION  2011   minimal disease, medical management  . CORONARY ANGIOPLASTY WITH STENT PLACEMENT  07/14/2012   successful PCI & stenting of mid LAD & PDA off the dominant CX  . INCISION AND DRAINAGE ABSCESS ANAL   1970's  . LEFT HEART CATHETERIZATION WITH CORONARY ANGIOGRAM N/A 07/14/2012   Procedure: LEFT HEART CATHETERIZATION WITH CORONARY ANGIOGRAM;  Surgeon: [name], MD;  Location: [place] CATH LAB;  Service: Cardiovascular;  Laterality: N/A;  . PERCUTANEOUS CORONARY STENT INTERVENTION (PCI-S) Right 07/14/2012   Procedure: PERCUTANEOUS CORONARY STENT INTERVENTION (PCI-S);  Surgeon: [name], MD;  Location: [facility] CATH LAB;  Service: Cardiovascular;  Laterality: Right;    Social History   Social History  . Marital status: Single    Spouse name: N/A  . Number of children: 0  . Years of education: N/A   Occupational History  . [employer]   .  [employer]   Social History Main Topics  . Smoking status: Never Smoker  . Smokeless tobacco: Never Used  . Alcohol use No  . Drug use: No  . Sexual activity: No   Other Topics Concern  . Not on file   Social History Narrative  . No narrative on file    Current Outpatient Prescriptions on File Prior to Visit  Medication Sig Dispense Refill  . amLODipine (NORVASC) 10 MG tablet Take 1 tablet (10 mg total) by mouth daily. 90 tablet 3  . aspirin 81 MG tablet Take 81 mg by mouth daily.      Marland Kitchen CREON 36000 units CPEP capsule TAKE 3 CAPSULES (108,000 UNITS TOTAL) BY MOUTH 3 (THREE) TIMES  DAILY WITH MEALS. AND SNACKS 810 capsule 0  . glucose blood (ACCU-CHEK AVIVA) test strip 4 (four) times daily. DX code e11.9 100 each 12  . lisinopril (PRINIVIL,ZESTRIL) 20 MG tablet Take 1 tablet (20 mg total) by mouth 2 (two) times daily. 180 tablet 3  . Multiple Vitamin (MULTIVITAMIN) capsule Take 1 capsule by mouth daily. HOLD UNTIL ABLE TO SWALLOW PILLS WHOLE    . Nebivolol HCl (BYSTOLIC) 20 MG TABS Take 1 tablet (20 mg total) by mouth daily. 90 tablet 3  . omeprazole (PRILOSEC) 20 MG capsule Take 1 capsule (20 mg total) by mouth daily. 90 capsule 3  . simvastatin (ZOCOR) 40 MG tablet Take 1 tablet (40 mg total) by mouth at bedtime. 90 tablet 3  .  spironolactone (ALDACTONE) 50 MG tablet Take 1 tablet (50 mg total) by mouth daily. 90 tablet 3   No current facility-administered medications on file prior to visit.     Allergies  Allergen Reactions  . Lipitor [Atorvastatin] Other (See Comments)    Muscle weakness    Family History  Problem Relation Age of Onset  . Diabetes Mother   . Diabetes Paternal Grandmother   . Diabetes Maternal Grandfather   . Colon cancer Neg Hx     BP 124/72   Pulse 72   Wt 210 lb 9.6 oz (95.5 kg)   SpO2 93%   BMI 28.56 kg/m    Review of Systems Denies LOC    Objective:   Physical Exam VITAL SIGNS:  See vs page.  GENERAL: no distress.   Pulses: dorsalis pedis intact bilat.   MSK: no deformity of the feet CV: no leg edema Skin:  healed ulcer at the dorsal aspect of the right foot.  normal color and temp on the feet.  Neuro: sensation is intact to touch on the feet, but decreased from normal.   Ext: both great toenails are ingrown, but no erythema/swell/tend.     A1c=9.2%  Lab Results  Component Value Date   CREATININE 1.27 04/21/2016   BUN 28 (H) 04/21/2016   NA 137 04/21/2016   K 4.0 04/21/2016   CL 103 04/21/2016   CO2 27 04/21/2016      Assessment & Plan:  Insulin-requiring type 2 DM, with PAD: Worse.  Renal insuff: in this setting: he needs a faster-acting QD insulin.  We have divided the dosage for now, be he may be able to take all in AM.    Patient Instructions  Please change the lantus to NPH, 50 units each morning, and 30 units each evening.  Please eat a light snack at bedtime, to prevent it from going low in the early hrs of the morning.  On this type of insulin schedule, you should eat meals on a regular schedule.  If a meal is missed or significantly delayed, your blood sugar could go low.  Please come back for a follow-up appointment in 2 months.   check your blood sugar twice a day.  vary the time of day when you check, between before the 3 meals, and at  bedtime.  also check if you have symptoms of your blood sugar being too high or too low.  please keep a record of the readings and bring it to your next appointment here (or you can bring the meter itself).  You can write it on any piece of paper.  please call us sooner if your blood sugar goes below 70, or if you have a lot of readings  over 200.  It is important to write on the paper to write down why you think it is high or low that time.

## 2017-06-08 NOTE — Patient Instructions (Addendum)
Please change the lantus to NPH, 50 units each morning, and 30 units each evening.  Please eat a light snack at bedtime, to prevent it from going low in the early hrs of the morning.  On this type of insulin schedule, you should eat meals on a regular schedule.  If a meal is missed or significantly delayed, your blood sugar could go low.  Please come back for a follow-up appointment in 2 months.   check your blood sugar twice a day.  vary the time of day when you check, between before the 3 meals, and at bedtime.  also check if you have symptoms of your blood sugar being too high or too low.  please keep a record of the readings and bring it to your next appointment here (or you can bring the meter itself).  You can write it on any piece of paper.  please call us sooner if your blood sugar goes below 70, or if you have a lot of readings over 200.  It is important to write on the paper to write down why you think it is high or low that time.

## 2017-06-21 ENCOUNTER — Other Ambulatory Visit: Payer: Self-pay | Admitting: Cardiovascular Disease

## 2017-07-08 ENCOUNTER — Ambulatory Visit: Payer: Medicare Other | Admitting: Cardiovascular Disease

## 2017-07-11 ENCOUNTER — Other Ambulatory Visit: Payer: Self-pay | Admitting: Endocrinology

## 2017-08-08 ENCOUNTER — Ambulatory Visit: Payer: Medicare Other | Admitting: Endocrinology

## 2017-10-03 ENCOUNTER — Encounter: Payer: Self-pay | Admitting: Cardiovascular Disease

## 2017-10-03 ENCOUNTER — Ambulatory Visit (INDEPENDENT_AMBULATORY_CARE_PROVIDER_SITE_OTHER): Payer: Medicare Other | Admitting: Cardiovascular Disease

## 2017-10-03 VITALS — BP 128/62 | HR 76 | Wt 211.0 lb

## 2017-10-03 DIAGNOSIS — I10 Essential (primary) hypertension: Secondary | ICD-10-CM

## 2017-10-03 DIAGNOSIS — E782 Mixed hyperlipidemia: Secondary | ICD-10-CM | POA: Diagnosis not present

## 2017-10-03 DIAGNOSIS — I5042 Chronic combined systolic (congestive) and diastolic (congestive) heart failure: Secondary | ICD-10-CM | POA: Diagnosis not present

## 2017-10-03 DIAGNOSIS — E089 Diabetes mellitus due to underlying condition without complications: Secondary | ICD-10-CM | POA: Diagnosis not present

## 2017-10-03 DIAGNOSIS — I2510 Atherosclerotic heart disease of native coronary artery without angina pectoris: Secondary | ICD-10-CM | POA: Diagnosis not present

## 2017-10-03 DIAGNOSIS — K8689 Other specified diseases of pancreas: Secondary | ICD-10-CM

## 2017-10-03 NOTE — Patient Instructions (Signed)
Dr Croitoru recommends that you schedule a follow-up appointment in 3 months.  If you need a refill on your cardiac medications before your next appointment, please call your pharmacy. 

## 2017-10-03 NOTE — Progress Notes (Signed)
Patient ID: David Lane, male   DOB: 22-Feb-1946, 72 y.o.   MRN: [id number] Patient ID: David Lane, male   DOB: Jul 12, 1946, 72 y.o.   MRN: [id number]    Cardiology Office Note    Date:  10/03/2017   ID:  David, Lane 1945/10/06, MRN [id number]  PCP:  Dorena Cookey, MD  Cardiologist:   [name], MD   chief complaint: BP management.   History of Present Illness:  David Lane is a 72 y.o. male with severe systemic hypertension and mild ischemic cardiomyopathy previously complicated by heart failure exacerbation. He has brittle diabetes mellitus following an episode of severe pancreatitis that caused both endocrine and exocrine pancreatic insufficiency.  Saurav generally feels well and denies cardiovascular complaints.  His blood pressure has been almost always within desirable range with rare instances of systolic blood pressure over 140 and without any episodes of symptomatic hypotension.  His biggest problem is the fact that his insurance company no longer [employer] as an approved medication.  Our previous attempts at controlling his blood pressure with other beta-blocker such as atenolol, metoprolol and carvedilol were all unsuccessful.  The shorter acting medications because a lot of issues with symptomatic hypotension.  Many of these medications also seemed to interfere with glycemic control. Bystolic has been the only beta-blocker that has provided smooth blood pressure control without side effects.  He has gained some weight.  He is less physically active than he was in the past.  Glycemic control remains less than perfect.  Most recent hemoglobin A1c 9.2% in September.    Past Medical History:  Diagnosis Date  . CAD (coronary artery disease)    last cath by [facility] DR.  Sruti Ayllon showing  some  disease involving LCX and small size of Diag   . CHF exacerbation, due to diastolic dysfunction 03/22/9484  . Chronic systolic CHF (congestive heart  failure), NYHA class 1 ([place]) 07/17/2012  . GERD (gastroesophageal reflux disease)   . Hepatic lesion 02/04/11  . Hyperlipemia   . Hypertension   . Ischemic cardiomyopathy    EF 35-40%  . Liver hemangioma   . NSTEMI (non-ST elevated myocardial infarction) ([place]) 11/21/2009  . Pancreatitis 2000's  . Shortness of breath   . Type II diabetes mellitus ([place])     Past Surgical History:  Procedure Laterality Date  . CARDIAC CATHETERIZATION  2011   minimal disease, medical management  . CORONARY ANGIOPLASTY WITH STENT PLACEMENT  07/14/2012   successful PCI & stenting of mid LAD & PDA off the dominant CX  . INCISION AND DRAINAGE ABSCESS ANAL  1970's  . LEFT HEART CATHETERIZATION WITH CORONARY ANGIOGRAM N/A 07/14/2012   Procedure: LEFT HEART CATHETERIZATION WITH CORONARY ANGIOGRAM;  Surgeon: [name], MD;  Location: [facility] CATH LAB;  Service: Cardiovascular;  Laterality: N/A;  . PERCUTANEOUS CORONARY STENT INTERVENTION (PCI-S) Right 07/14/2012   Procedure: PERCUTANEOUS CORONARY STENT INTERVENTION (PCI-S);  Surgeon: [name], MD;  Location: [facility] CATH LAB;  Service: Cardiovascular;  Laterality: Right;    Outpatient Medications Prior to Visit  Medication Sig Dispense Refill  . amLODipine (NORVASC) 5 MG tablet take 2 tablets by mouth once daily 180 tablet 2  . aspirin 81 MG tablet Take 81 mg by mouth daily.      . B-D UF III MINI PEN NEEDLES 31G X 5 MM MISC use to inject daily 100 each 2  . glucose blood (ACCU-CHEK AVIVA) test strip 4 (four) times daily. DX code e11.9  100 each 12  . insulin NPH Human (NOVOLIN N) 100 UNIT/ML injection 50 units each morning, and 30 units each evening, and syringes 2/day. 30 mL 11  . lisinopril (PRINIVIL,ZESTRIL) 20 MG tablet Take 1 tablet (20 mg total) by mouth 2 (two) times daily. 180 tablet 3  . Multiple Vitamin (MULTIVITAMIN) capsule Take 1 capsule by mouth daily. HOLD UNTIL ABLE TO SWALLOW PILLS WHOLE    . Nebivolol HCl (BYSTOLIC) 20 MG TABS Take 1 tablet  (20 mg total) by mouth daily. 90 tablet 3  . omeprazole (PRILOSEC) 20 MG capsule Take 1 capsule (20 mg total) by mouth daily. 90 capsule 3  . simvastatin (ZOCOR) 40 MG tablet Take 1 tablet (40 mg total) by mouth at bedtime. 90 tablet 3  . spironolactone (ALDACTONE) 50 MG tablet Take 1 tablet (50 mg total) by mouth daily. 90 tablet 3  . amLODipine (NORVASC) 10 MG tablet Take 1 tablet (10 mg total) by mouth daily. 90 tablet 3  . CREON 36000 units CPEP capsule TAKE 3 CAPSULES (108,000 UNITS TOTAL) BY MOUTH 3 (THREE) TIMES DAILY WITH MEALS. AND SNACKS (Patient not taking: Reported on 10/03/2017) 810 capsule 0   No facility-administered medications prior to visit.      Allergies:   Lipitor [atorvastatin]   Social History   Socioeconomic History  . Marital status: Single    Spouse name: None  . Number of children: 0  . Years of education: None  . Highest education level: None  Social Needs  . Financial resource strain: None  . Food insecurity - worry: None  . Food insecurity - inability: None  . Transportation needs - medical: None  . Transportation needs - non-medical: None  Occupational History  . Occupation: Aeronautical engineer: [employer]  Tobacco Use  . Smoking status: Never Smoker  . Smokeless tobacco: Never Used  Substance and Sexual Activity  . Alcohol use: No  . Drug use: No  . Sexual activity: No  Other Topics Concern  . None  Social History Narrative  . None     Family History:  The patient's family history includes Diabetes in his maternal grandfather, mother, and paternal grandmother.   ROS:   Please see the history of present illness.    ROS All other systems reviewed and are negative.   PHYSICAL EXAM:   VS:  BP 128/62   Pulse 76   Wt 211 lb (95.7 kg)   BMI 28.62 kg/m     General: Alert, oriented x3, no distress, moderately overweight Head: no evidence of trauma, PERRL, EOMI, no exophtalmos or lid lag, no myxedema, no xanthelasma; normal ears,  nose and oropharynx Neck: normal jugular venous pulsations and no hepatojugular reflux; brisk carotid pulses without delay and no carotid bruits Chest: clear to auscultation, no signs of consolidation by percussion or palpation, normal fremitus, symmetrical and full respiratory excursions Cardiovascular: normal position and quality of the apical impulse, regular rhythm, normal first and second heart sounds, no murmurs, rubs or gallops Abdomen: no tenderness or distention, no masses by palpation, no abnormal pulsatility or arterial bruits, normal bowel sounds, no hepatosplenomegaly Extremities: no clubbing, cyanosis or edema; 2+ radial, ulnar and brachial pulses bilaterally; 2+ right femoral, posterior tibial and dorsalis pedis pulses; 2+ left femoral, posterior tibial and dorsalis pedis pulses; no subclavian or femoral bruits Neurological: grossly nonfocal Psych: Normal mood and affect    Wt Readings from Last 3 Encounters:  10/03/17 211 lb (95.7 kg)  06/08/17  210 lb 9.6 oz (95.5 kg)  04/07/17 209 lb (94.8 kg)      Studies/Labs Reviewed:   EKG:  EKG is ordered today.  It shows normal sinus rhythm with first-degree AV block, no repolarization abnormalities   Lipid Panel    Component Value Date/Time   CHOL 113 04/21/2016 0833   TRIG 143.0 04/21/2016 0833   HDL 27.10 (L) 04/21/2016 0833   CHOLHDL 4 04/21/2016 0833   VLDL 28.6 04/21/2016 0833   LDLCALC 57 04/21/2016 0833    ASSESSMENT:    1. Chronic combined systolic and diastolic CHF (congestive heart failure) ([place])   2. Essential hypertension   3. Coronary artery disease involving native coronary artery of native heart without angina pectoris   4. Mixed hyperlipidemia   5. 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 Pancreatic insufficiency      PLAN:  In order of problems listed above:  1. CHF: On the current medical regimen he appears euvolemic and NYHA functional class I despite not taking any  loop diuretics.  NYHA class I, euvolemic.  On beta blocker, spironolactone, ACE inhibitor. Mixed ischemic and hypertensive cardiomyopathy. Most recent assessment showed EF 50%  2. HTN: After many changes in his antihypertensive medications he has been stable on the current regimen for a long time.  I am very reluctant to switch him to an alternative medication and we will try to secure coverage from his insurance company for the [name]. 3. CAD: Asymptomatic, no angina 4. HLP: LDL cholesterol is excellent.  Will have a lipid profile with his diabetes specialist when he next checks his hemoglobin A1c. 5. 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  He has gained weight and is more sedentary.  Encouraged more physical activity. 6. Pancreatic insufficiency might be contributing to less predictable absorptionof nutrients and medications; this may explain some of the volatility and glycemic control, volume status and blood pressure.  [place] is very anxious. He requires constant reassurance. Will keep his appointments on at least an every three-month basis.  Medication Adjustments/Labs and Tests Ordered: Current medicines are reviewed at length with the patient today.  Concerns regarding medicines are outlined above.  Medication changes, Labs and Tests ordered today are listed in the Patient Instructions below. Patient Instructions  Dr Sallyanne Kuster recommends that you schedule a follow-up appointment in 3 months.  If you need a refill on your cardiac medications before your next appointment, please call your pharmacy.   Signed, [name], MD  10/03/2017 12:49 PM    [address], [place], [place]  [address] Phone: [phone]; Fax: [phone]

## 2017-11-03 ENCOUNTER — Telehealth: Payer: Self-pay

## 2017-11-03 ENCOUNTER — Other Ambulatory Visit: Payer: Self-pay

## 2017-11-03 MED ORDER — INSULIN PEN NEEDLE 31G X 5 MM MISC: 2 refills | Status: DC

## 2017-11-03 NOTE — Telephone Encounter (Signed)
Ok. Please refill x 1 Ov is due

## 2017-11-03 NOTE — Telephone Encounter (Signed)
Patient called back and stated he is taking 85 units of the Lantus not 80

## 2017-11-03 NOTE — Telephone Encounter (Signed)
Patient wants to be put back on Lantus- in his chart it was DC please advise

## 2017-11-04 MED ORDER — INSULIN GLARGINE 100 UNIT/ML SOLOSTAR PEN: 85.0000 [IU] | PEN_INJECTOR | Freq: Every day | SUBCUTANEOUS | 1 refills | Status: DC

## 2017-11-04 NOTE — Addendum Note (Signed)
Addended by: Drucilla Schmidt on: 11/04/2017 09:23 AM   Modules accepted: Orders

## 2017-11-04 NOTE — Telephone Encounter (Signed)
Sent medication

## 2017-12-20 ENCOUNTER — Other Ambulatory Visit: Payer: Self-pay

## 2017-12-20 ENCOUNTER — Inpatient Hospital Stay (HOSPITAL_COMMUNITY)
Admission: EM | Disposition: A | Discharge status: Home / Self Care | Payer: Self-pay | Source: Home / Self Care | Attending: Cardiology

## 2017-12-20 ENCOUNTER — Encounter (HOSPITAL_COMMUNITY): Payer: Self-pay | Admitting: *Deleted

## 2017-12-20 ENCOUNTER — Inpatient Hospital Stay (HOSPITAL_COMMUNITY)
Admission: EM | Admit: 2017-12-20 | Discharge: 2017-12-25 | DRG: 246 | Disposition: A | Discharge status: Home / Self Care | Payer: Medicare Other | Attending: Cardiology | Admitting: Cardiology

## 2017-12-20 DIAGNOSIS — I5022 Chronic systolic (congestive) heart failure: Secondary | ICD-10-CM | POA: Diagnosis present

## 2017-12-20 DIAGNOSIS — I110 Hypertensive heart disease with heart failure: Secondary | ICD-10-CM | POA: Diagnosis present

## 2017-12-20 DIAGNOSIS — T82855A Stenosis of coronary artery stent, initial encounter: Secondary | ICD-10-CM | POA: Diagnosis not present

## 2017-12-20 DIAGNOSIS — Z79899 Other long term (current) drug therapy: Secondary | ICD-10-CM | POA: Diagnosis not present

## 2017-12-20 DIAGNOSIS — E089 Diabetes mellitus due to underlying condition without complications: Secondary | ICD-10-CM | POA: Diagnosis not present

## 2017-12-20 DIAGNOSIS — Z955 Presence of coronary angioplasty implant and graft: Secondary | ICD-10-CM | POA: Diagnosis not present

## 2017-12-20 DIAGNOSIS — E785 Hyperlipidemia, unspecified: Secondary | ICD-10-CM | POA: Diagnosis not present

## 2017-12-20 DIAGNOSIS — Z8719 Personal history of other diseases of the digestive system: Secondary | ICD-10-CM | POA: Diagnosis not present

## 2017-12-20 DIAGNOSIS — E1169 Type 2 diabetes mellitus with other specified complication: Secondary | ICD-10-CM | POA: Diagnosis present

## 2017-12-20 DIAGNOSIS — K219 Gastro-esophageal reflux disease without esophagitis: Secondary | ICD-10-CM | POA: Diagnosis present

## 2017-12-20 DIAGNOSIS — Z794 Long term (current) use of insulin: Secondary | ICD-10-CM

## 2017-12-20 DIAGNOSIS — I25119 Atherosclerotic heart disease of native coronary artery with unspecified angina pectoris: Secondary | ICD-10-CM | POA: Diagnosis not present

## 2017-12-20 DIAGNOSIS — K8689 Other specified diseases of pancreas: Secondary | ICD-10-CM | POA: Diagnosis present

## 2017-12-20 DIAGNOSIS — I2510 Atherosclerotic heart disease of native coronary artery without angina pectoris: Secondary | ICD-10-CM

## 2017-12-20 DIAGNOSIS — I252 Old myocardial infarction: Secondary | ICD-10-CM

## 2017-12-20 DIAGNOSIS — T82867A Thrombosis of cardiac prosthetic devices, implants and grafts, initial encounter: Secondary | ICD-10-CM | POA: Diagnosis not present

## 2017-12-20 DIAGNOSIS — E1165 Type 2 diabetes mellitus with hyperglycemia: Secondary | ICD-10-CM | POA: Diagnosis present

## 2017-12-20 DIAGNOSIS — I10 Essential (primary) hypertension: Secondary | ICD-10-CM | POA: Diagnosis not present

## 2017-12-20 DIAGNOSIS — I2102 ST elevation (STEMI) myocardial infarction involving left anterior descending coronary artery: Secondary | ICD-10-CM | POA: Diagnosis present

## 2017-12-20 DIAGNOSIS — I951 Orthostatic hypotension: Secondary | ICD-10-CM | POA: Diagnosis not present

## 2017-12-20 DIAGNOSIS — I255 Ischemic cardiomyopathy: Secondary | ICD-10-CM | POA: Diagnosis present

## 2017-12-20 DIAGNOSIS — Z888 Allergy status to other drugs, medicaments and biological substances status: Secondary | ICD-10-CM

## 2017-12-20 DIAGNOSIS — E7849 Other hyperlipidemia: Secondary | ICD-10-CM | POA: Diagnosis not present

## 2017-12-20 DIAGNOSIS — Z7982 Long term (current) use of aspirin: Secondary | ICD-10-CM | POA: Diagnosis not present

## 2017-12-20 DIAGNOSIS — K529 Noninfective gastroenteritis and colitis, unspecified: Secondary | ICD-10-CM | POA: Diagnosis not present

## 2017-12-20 DIAGNOSIS — Y831 Surgical operation with implant of artificial internal device as the cause of abnormal reaction of the patient, or of later complication, without mention of misadventure at the time of the procedure: Secondary | ICD-10-CM | POA: Diagnosis present

## 2017-12-20 DIAGNOSIS — I509 Heart failure, unspecified: Secondary | ICD-10-CM | POA: Diagnosis not present

## 2017-12-20 DIAGNOSIS — R079 Chest pain, unspecified: Secondary | ICD-10-CM | POA: Diagnosis not present

## 2017-12-20 DIAGNOSIS — I5043 Acute on chronic combined systolic (congestive) and diastolic (congestive) heart failure: Secondary | ICD-10-CM | POA: Diagnosis not present

## 2017-12-20 DIAGNOSIS — I5042 Chronic combined systolic (congestive) and diastolic (congestive) heart failure: Secondary | ICD-10-CM | POA: Diagnosis present

## 2017-12-20 DIAGNOSIS — I219 Acute myocardial infarction, unspecified: Secondary | ICD-10-CM | POA: Diagnosis present

## 2017-12-20 DIAGNOSIS — E782 Mixed hyperlipidemia: Secondary | ICD-10-CM | POA: Diagnosis not present

## 2017-12-20 HISTORY — PX: LEFT HEART CATH AND CORONARY ANGIOGRAPHY: CATH118249

## 2017-12-20 HISTORY — PX: CORONARY/GRAFT ACUTE MI REVASCULARIZATION: CATH118305

## 2017-12-20 LAB — COMPREHENSIVE METABOLIC PANEL
ALBUMIN: 3.1 g/dL — AB (ref 3.5–5.0)
ALK PHOS: 65 U/L (ref 38–126)
ALT: 24 U/L (ref 17–63)
AST: 25 U/L (ref 15–41)
Anion gap: 9 (ref 5–15)
BUN: 20 mg/dL (ref 6–20)
CALCIUM: 8.2 mg/dL — AB (ref 8.9–10.3)
CO2: 21 mmol/L — AB (ref 22–32)
CREATININE: 1.01 mg/dL (ref 0.61–1.24)
Chloride: 108 mmol/L (ref 101–111)
GFR calc Af Amer: >60 mL/min (ref >60)
GFR calc non Af Amer: >60 mL/min (ref >60)
GLUCOSE: 219 mg/dL — AB (ref 65–99)
Potassium: 3.9 mmol/L (ref 3.5–5.1)
SODIUM: 138 mmol/L (ref 135–145)
Total Bilirubin: 0.7 mg/dL (ref 0.3–1.2)
Total Protein: 5.8 g/dL — ABNORMAL LOW (ref 6.5–8.1)

## 2017-12-20 LAB — POCT I-STAT, CHEM 8
BUN: 19 mg/dL (ref 6–20)
CHLORIDE: 105 mmol/L (ref 101–111)
Calcium, Ion: 1.11 mmol/L — ABNORMAL LOW (ref 1.15–1.40)
Creatinine, Ser: 0.9 mg/dL (ref 0.61–1.24)
Glucose, Bld: 224 mg/dL — ABNORMAL HIGH (ref 65–99)
HEMATOCRIT: 31 % — AB (ref 39.0–52.0)
Hemoglobin: 10.5 g/dL — ABNORMAL LOW (ref 13.0–17.0)
POTASSIUM: 4 mmol/L (ref 3.5–5.1)
SODIUM: 141 mmol/L (ref 135–145)
TCO2: 22 mmol/L (ref 22–32)

## 2017-12-20 LAB — GLUCOSE, CAPILLARY
GLUCOSE-CAPILLARY: 246 mg/dL — AB (ref 65–99)
GLUCOSE-CAPILLARY: 263 mg/dL — AB (ref 65–99)
Glucose-Capillary: 223 mg/dL — ABNORMAL HIGH (ref 65–99)

## 2017-12-20 LAB — LIPID PANEL
Cholesterol: 101 mg/dL (ref 0–200)
HDL: 24 mg/dL — AB (ref >40)
LDL CALC: 65 mg/dL (ref 0–99)
Total CHOL/HDL Ratio: 4.2 RATIO
Triglycerides: 60 mg/dL (ref <150)
VLDL: 12 mg/dL (ref 0–40)

## 2017-12-20 LAB — POCT ACTIVATED CLOTTING TIME: Activated Clotting Time: 323 seconds

## 2017-12-20 LAB — CBC
HCT: 31.8 % — ABNORMAL LOW (ref 39.0–52.0)
Hemoglobin: 10.8 g/dL — ABNORMAL LOW (ref 13.0–17.0)
MCH: 30.3 pg (ref 26.0–34.0)
MCHC: 34 g/dL (ref 30.0–36.0)
MCV: 89.3 fL (ref 78.0–100.0)
PLATELETS: 247 10*3/uL (ref 150–400)
RBC: 3.56 MIL/uL — AB (ref 4.22–5.81)
RDW: 13 % (ref 11.5–15.5)
WBC: 9 10*3/uL (ref 4.0–10.5)

## 2017-12-20 LAB — PROTIME-INR
INR: 1.14
PROTHROMBIN TIME: 14.5 s (ref 11.4–15.2)

## 2017-12-20 LAB — TROPONIN I: TROPONIN I: 0.15 ng/mL — AB (ref <0.03)

## 2017-12-20 LAB — HEMOGLOBIN A1C
HEMOGLOBIN A1C: 10 % — AB (ref 4.8–5.6)
Mean Plasma Glucose: 240.3 mg/dL

## 2017-12-20 LAB — APTT: aPTT: 32 seconds (ref 24–36)

## 2017-12-20 SURGERY — LEFT HEART CATH AND CORONARY ANGIOGRAPHY
Anesthesia: LOCAL

## 2017-12-20 MED ORDER — NEBIVOLOL HCL 10 MG PO TABS
20.0000 mg | ORAL_TABLET | Freq: Every day | ORAL | Status: DC
  Administered 2017-12-21: 20 mg via ORAL
  Filled 2017-12-20 (×2): qty 2

## 2017-12-20 MED ORDER — HEPARIN SODIUM (PORCINE) 1000 UNIT/ML IJ SOLN
INTRAMUSCULAR | Status: DC | PRN
  Administered 2017-12-20: 10000 [IU] via INTRAVENOUS

## 2017-12-20 MED ORDER — HEPARIN SODIUM (PORCINE) 1000 UNIT/ML IJ SOLN
INTRAMUSCULAR | Status: AC
  Filled 2017-12-20: qty 1

## 2017-12-20 MED ORDER — SODIUM CHLORIDE 0.9 % IV SOLN: INTRAVENOUS | Status: AC

## 2017-12-20 MED ORDER — NITROGLYCERIN 1 MG/10 ML FOR IR/CATH LAB
INTRA_ARTERIAL | Status: DC | PRN
  Administered 2017-12-20: 100 ug via INTRACORONARY

## 2017-12-20 MED ORDER — VERAPAMIL HCL 2.5 MG/ML IV SOLN
INTRAVENOUS | Status: AC
  Filled 2017-12-20: qty 2

## 2017-12-20 MED ORDER — ENOXAPARIN SODIUM 40 MG/0.4ML ~~LOC~~ SOLN
40.0000 mg | SUBCUTANEOUS | Status: DC
  Administered 2017-12-21 – 2017-12-25 (×5): 40 mg via SUBCUTANEOUS
  Filled 2017-12-20 (×5): qty 0.4

## 2017-12-20 MED ORDER — HYDRALAZINE HCL 20 MG/ML IJ SOLN: 5.0000 mg | INTRAMUSCULAR | Status: AC | PRN

## 2017-12-20 MED ORDER — IOPAMIDOL (ISOVUE-370) INJECTION 76%
INTRAVENOUS | Status: AC
  Filled 2017-12-20: qty 100

## 2017-12-20 MED ORDER — ASPIRIN 81 MG PO CHEW
81.0000 mg | CHEWABLE_TABLET | Freq: Every day | ORAL | Status: DC
  Administered 2017-12-21 – 2017-12-25 (×5): 81 mg via ORAL
  Filled 2017-12-20 (×5): qty 1

## 2017-12-20 MED ORDER — ADULT MULTIVITAMIN W/MINERALS CH
1.0000 | ORAL_TABLET | Freq: Every day | ORAL | Status: DC
  Administered 2017-12-21 – 2017-12-25 (×5): 1 via ORAL
  Filled 2017-12-20 (×5): qty 1

## 2017-12-20 MED ORDER — VERAPAMIL HCL 2.5 MG/ML IV SOLN
INTRAVENOUS | Status: DC | PRN
  Administered 2017-12-20: 15:00:00 via INTRA_ARTERIAL

## 2017-12-20 MED ORDER — HEPARIN (PORCINE) IN NACL 2-0.9 UNIT/ML-% IJ SOLN
INTRAMUSCULAR | Status: AC
  Filled 2017-12-20: qty 1000

## 2017-12-20 MED ORDER — IOPAMIDOL (ISOVUE-370) INJECTION 76%
INTRAVENOUS | Status: DC | PRN
  Administered 2017-12-20: 155 mL via INTRA_ARTERIAL

## 2017-12-20 MED ORDER — NITROGLYCERIN 1 MG/10 ML FOR IR/CATH LAB
INTRA_ARTERIAL | Status: AC
  Filled 2017-12-20: qty 10

## 2017-12-20 MED ORDER — LISINOPRIL 20 MG PO TABS
20.0000 mg | ORAL_TABLET | Freq: Two times a day (BID) | ORAL | Status: DC
  Administered 2017-12-20 – 2017-12-23 (×6): 20 mg via ORAL
  Filled 2017-12-20 (×6): qty 1

## 2017-12-20 MED ORDER — LIDOCAINE HCL (PF) 1 % IJ SOLN
INTRAMUSCULAR | Status: DC | PRN
  Administered 2017-12-20: 2 mL

## 2017-12-20 MED ORDER — HEPARIN (PORCINE) IN NACL 2-0.9 UNIT/ML-% IJ SOLN
INTRAMUSCULAR | Status: AC | PRN
  Administered 2017-12-20 (×2): 500 mL via INTRA_ARTERIAL

## 2017-12-20 MED ORDER — LABETALOL HCL 5 MG/ML IV SOLN
10.0000 mg | INTRAVENOUS | Status: AC | PRN
Start: 2017-12-20 — End: 2017-12-20

## 2017-12-20 MED ORDER — TICAGRELOR 90 MG PO TABS
ORAL_TABLET | ORAL | Status: AC
  Filled 2017-12-20: qty 2

## 2017-12-20 MED ORDER — SODIUM CHLORIDE 0.9 % IV SOLN: 250.0000 mL | INTRAVENOUS | Status: DC | PRN

## 2017-12-20 MED ORDER — TICAGRELOR 90 MG PO TABS
ORAL_TABLET | ORAL | Status: DC | PRN
  Administered 2017-12-20: 180 mg via ORAL

## 2017-12-20 MED ORDER — SPIRONOLACTONE 50 MG PO TABS
50.0000 mg | ORAL_TABLET | Freq: Every day | ORAL | Status: DC
  Administered 2017-12-21 – 2017-12-22 (×2): 50 mg via ORAL
  Filled 2017-12-20: qty 2
  Filled 2017-12-20 (×2): qty 1
  Filled 2017-12-20: qty 2

## 2017-12-20 MED ORDER — SIMVASTATIN 40 MG PO TABS
40.0000 mg | ORAL_TABLET | Freq: Every day | ORAL | Status: DC
  Administered 2017-12-20: 40 mg via ORAL
  Filled 2017-12-20: qty 1

## 2017-12-20 MED ORDER — INSULIN ASPART 100 UNIT/ML ~~LOC~~ SOLN
0.0000 [IU] | Freq: Three times a day (TID) | SUBCUTANEOUS | Status: DC
  Administered 2017-12-21 (×2): 5 [IU] via SUBCUTANEOUS
  Administered 2017-12-22: 8 [IU] via SUBCUTANEOUS
  Administered 2017-12-22: 5 [IU] via SUBCUTANEOUS
  Administered 2017-12-22: 3 [IU] via SUBCUTANEOUS

## 2017-12-20 MED ORDER — PANTOPRAZOLE SODIUM 40 MG PO TBEC
40.0000 mg | DELAYED_RELEASE_TABLET | Freq: Every day | ORAL | Status: DC
  Administered 2017-12-21 – 2017-12-25 (×5): 40 mg via ORAL
  Filled 2017-12-20 (×5): qty 1

## 2017-12-20 MED ORDER — SODIUM CHLORIDE 0.9% FLUSH
3.0000 mL | Freq: Two times a day (BID) | INTRAVENOUS | Status: DC
  Administered 2017-12-20 – 2017-12-25 (×9): 3 mL via INTRAVENOUS

## 2017-12-20 MED ORDER — IOPAMIDOL (ISOVUE-370) INJECTION 76%
INTRAVENOUS | Status: AC
  Filled 2017-12-20: qty 125

## 2017-12-20 MED ORDER — ACETAMINOPHEN 325 MG PO TABS
650.0000 mg | ORAL_TABLET | ORAL | Status: DC | PRN
  Administered 2017-12-21 (×2): 650 mg via ORAL
  Filled 2017-12-20 (×2): qty 2

## 2017-12-20 MED ORDER — ONDANSETRON HCL 4 MG/2ML IJ SOLN: 4.0000 mg | Freq: Four times a day (QID) | INTRAMUSCULAR | Status: DC | PRN

## 2017-12-20 MED ORDER — SODIUM CHLORIDE 0.9% FLUSH
3.0000 mL | INTRAVENOUS | Status: DC | PRN
  Administered 2017-12-23: 3 mL via INTRAVENOUS
  Filled 2017-12-20: qty 3

## 2017-12-20 MED ORDER — TICAGRELOR 90 MG PO TABS
90.0000 mg | ORAL_TABLET | Freq: Two times a day (BID) | ORAL | Status: DC
  Administered 2017-12-21 – 2017-12-25 (×10): 90 mg via ORAL
  Filled 2017-12-20 (×10): qty 1

## 2017-12-20 SURGICAL SUPPLY — 21 items
BALLN SAPPHIRE 2.5X15 (BALLOONS) ×2
BALLN SAPPHIRE ~~LOC~~ 2.75X18 (BALLOONS) ×2 IMPLANT
BALLOON SAPPHIRE 2.5X15 (BALLOONS) ×1 IMPLANT
BAND CMPR LRG ZPHR (HEMOSTASIS) ×1
BAND ZEPHYR COMPRESS 30 LONG (HEMOSTASIS) ×2 IMPLANT
CATH INFINITI 5FR ANG PIGTAIL (CATHETERS) ×2 IMPLANT
CATH INFINITI JR4 5F (CATHETERS) ×2 IMPLANT
CATH VISTA GUIDE 6FR XBLAD3.5 (CATHETERS) ×2 IMPLANT
ELECT DEFIB PAD ADLT CADENCE (PAD) ×2 IMPLANT
GUIDEWIRE INQWIRE 1.5J.035X260 (WIRE) ×1 IMPLANT
INQWIRE 1.5J .035X260CM (WIRE) ×2
KIT ENCORE 26 ADVANTAGE (KITS) ×2 IMPLANT
KIT HEART LEFT (KITS) ×2 IMPLANT
NEEDLE PERC 21GX4CM (NEEDLE) ×2 IMPLANT
PACK CARDIAC CATHETERIZATION (CUSTOM PROCEDURE TRAY) ×2 IMPLANT
SHEATH RAIN RADIAL 21G 6FR (SHEATH) ×2 IMPLANT
STENT SYNERGY DES 2.5X32 (Permanent Stent) ×2 IMPLANT
SYR MEDRAD MARK V 150ML (SYRINGE) ×2 IMPLANT
TRANSDUCER W/STOPCOCK (MISCELLANEOUS) ×2 IMPLANT
TUBING CIL FLEX 10 FLL-RA (TUBING) ×2 IMPLANT
WIRE ASAHI PROWATER 180CM (WIRE) ×2 IMPLANT

## 2017-12-20 NOTE — Progress Notes (Signed)
Thanks. First troponin very low, hopefully no serious sequelae from this. He is very nervous (even when well) and has very brittle insulin dependent diabetes - easily gets hypoglycemic. Thank you for taking care of him, MCr

## 2017-12-20 NOTE — H&P (Addendum)
History & Physical    Patient ID: David Lane MRN: [id number], DOB/AGE: January 13, 1946   Admit date: 12/20/2017   Primary Physician: Dorena Cookey, MD Primary Cardiologist: Dr. Sallyanne Kuster   Patient Profile    72 yo male with PMH of CAD s/p previous stents in the PDA/LAD, HTN, HL, IDDM, Pancreatitis and chronic systolic HF who presented with chest pain that started around 1230pm after lunch today.   Past Medical History   Past Medical History:  Diagnosis Date  . CAD (coronary artery disease)    last cath by [facility] DR.  Mihai Croitoru showing  some  disease involving LCX and small size of Diag   . CHF exacerbation, due to diastolic dysfunction 34/19/6222  . Chronic systolic CHF (congestive heart failure), NYHA class 1 ([place]) 07/17/2012  . GERD (gastroesophageal reflux disease)   . Hepatic lesion 02/04/11  . Hyperlipemia   . Hypertension   . Ischemic cardiomyopathy    EF 35-40%  . Liver hemangioma   . NSTEMI (non-ST elevated myocardial infarction) ([place]) 11/21/2009  . Pancreatitis 2000's  . Shortness of breath   . Type II diabetes mellitus ([place])     Past Surgical History:  Procedure Laterality Date  . CARDIAC CATHETERIZATION  2011   minimal disease, medical management  . CORONARY ANGIOPLASTY WITH STENT PLACEMENT  07/14/2012   successful PCI & stenting of mid LAD & PDA off the dominant CX  . INCISION AND DRAINAGE ABSCESS ANAL  1970's  . LEFT HEART CATHETERIZATION WITH CORONARY ANGIOGRAM N/A 07/14/2012   Procedure: LEFT HEART CATHETERIZATION WITH CORONARY ANGIOGRAM;  Surgeon: [name], MD;  Location: [facility] CATH LAB;  Service: Cardiovascular;  Laterality: N/A;  . PERCUTANEOUS CORONARY STENT INTERVENTION (PCI-S) Right 07/14/2012   Procedure: PERCUTANEOUS CORONARY STENT INTERVENTION (PCI-S);  Surgeon: [name], MD;  Location: [facility] CATH LAB;  Service: Cardiovascular;  Laterality: Right;     Allergies  Allergies  Allergen Reactions  . Lipitor [Atorvastatin] Other (See  Comments)    Muscle weakness    History of Present Illness    [name] is a 72 yo male with PMH of CAD s/p previous stents in the PDA/LAD, HTN, HL, IDDM, Pancreatitis and chronic systolic HF. He has been followed by Dr. Sallyanne Kuster in the past. Had last cath in 2014, with stents noted in the RCA/LAD. Noted to have issues with controlling his blood pressure in the past and has been on several medications. Only on 81mg  ASA, no plavix. Reports he has been in his usual state of health until this afternoon. Reports he developed the sensation of someone pressing on his chest on the left side. This was around 12:30pm after he ate lunch. He became concerned and called EMS as he has hx of CAD.   On EMS arrival his EKG showed SR with anterosepal ST elevation. He was given 324mg  ASA and 1 SL nitro. This did drop his systolic blood pressure into the 80s. Given 300cc of fluids with improvement. On arrival noted minimal chest pain. Blood pressure has stabilized. He was brought directly to the cath lab.   Home Medications    Prior to Admission medications   Medication Sig Start Date End Date Taking? Authorizing Provider  amLODipine (NORVASC) 10 MG tablet Take 1 tablet (10 mg total) by mouth daily. 11/08/16   Croitoru, Mihai, MD  amLODipine (NORVASC) 5 MG tablet take 2 tablets by mouth once daily 06/21/17   Croitoru, Mihai, MD  aspirin 81 MG tablet Take 81 mg by  mouth daily.      [provider]  CREON 36000 units CPEP capsule TAKE 3 CAPSULES (108,000 UNITS TOTAL) BY MOUTH 3 (THREE) TIMES DAILY WITH MEALS. AND SNACKS Patient not taking: Reported on 10/03/2017 03/28/16   Mauri Pole, MD  glucose blood (ACCU-CHEK AVIVA) test strip 4 (four) times daily. DX code e11.9 03/24/17   Renato Shin, MD  Insulin Glargine (LANTUS SOLOSTAR) 100 UNIT/ML Solostar Pen Inject 85 Units into the skin daily at 10 pm. NEED APPOINTMENT 11/04/17   Renato Shin, MD  insulin NPH Human (NOVOLIN N) 100 UNIT/ML injection 50 units  each morning, and 30 units each evening, and syringes 2/day. 06/08/17   Renato Shin, MD  Insulin Pen Needle (B-D UF III MINI PEN NEEDLES) 31G X 5 MM MISC use to inject insulin daily 11/03/17   Renato Shin, MD  lisinopril (PRINIVIL,ZESTRIL) 20 MG tablet Take 1 tablet (20 mg total) by mouth 2 (two) times daily. 11/08/16   Croitoru, Mihai, MD  Multiple Vitamin (MULTIVITAMIN) capsule Take 1 capsule by mouth daily. HOLD UNTIL ABLE TO SWALLOW PILLS WHOLE 01/15/14   [name], MD  Nebivolol HCl (BYSTOLIC) 20 MG TABS Take 1 tablet (20 mg total) by mouth daily. 11/08/16   Croitoru, Mihai, MD  omeprazole (PRILOSEC) 20 MG capsule Take 1 capsule (20 mg total) by mouth daily. 11/08/16   Croitoru, Mihai, MD  simvastatin (ZOCOR) 40 MG tablet Take 1 tablet (40 mg total) by mouth at bedtime. 01/07/17   Croitoru, Mihai, MD  spironolactone (ALDACTONE) 50 MG tablet Take 1 tablet (50 mg total) by mouth daily. 10/08/16   Croitoru, Dani Gobble, MD    Family History    Family History  Problem Relation Age of Onset  . Diabetes Mother   . Diabetes Paternal Grandmother   . Diabetes Maternal Grandfather   . Colon cancer Neg Hx     Social History    Social History   Socioeconomic History  . Marital status: Single    Spouse name: Not on file  . Number of children: 0  . Years of education: Not on file  . Highest education level: Not on file  Occupational History  . Occupation: Aeronautical engineer: [employer]  Social Needs  . Financial resource strain: Not on file  . Food insecurity:    Worry: Not on file    Inability: Not on file  . Transportation needs:    Medical: Not on file    Non-medical: Not on file  Tobacco Use  . Smoking status: Never Smoker  . Smokeless tobacco: Never Used  Substance and Sexual Activity  . Alcohol use: No  . Drug use: No  . Sexual activity: Never  Lifestyle  . Physical activity:    Days per week: Not on file    Minutes per session: Not on file  . Stress: Not on  file  Relationships  . Social connections:    Talks on phone: Not on file    Gets together: Not on file    Attends religious service: Not on file    Active member of club or organization: Not on file    Attends meetings of clubs or organizations: Not on file    Relationship status: Not on file  . Intimate partner violence:    Fear of current or ex partner: Not on file    Emotionally abused: Not on file    Physically abused: Not on file    Forced sexual activity:  Not on file  Other Topics Concern  . Not on file  Social History Narrative  . Not on file     Review of Systems    See HPI  All other systems reviewed and are otherwise negative except as noted above.  Physical Exam    SpO2 98 %.  General: Pleasant, older WM, NAD Psych: Normal affect. Neuro: Alert and oriented X 3. Moves all extremities spontaneously. HEENT: Normal  Neck: Supple without bruits or JVD. Lungs:  Resp regular and unlabored, CTA. Heart: RRR no s3, s4, or murmurs. Abdomen: Soft, non-tender, non-distended, BS + x 4.  Extremities: No clubbing, cyanosis or edema. DP/PT/Radials 2+ and equal bilaterally.  Labs    Troponin (Point of Care Test) No results for input(s): TROPIPOC in the last 72 hours. No results for input(s): CKTOTAL, CKMB, TROPONINI in the last 72 hours. Lab Results  Component Value Date   WBC 6.3 11/25/2014   HGB 12.9 (L) 11/25/2014   HCT 38.2 (L) 11/25/2014   MCV 90.7 11/25/2014   PLT 247.0 11/25/2014   No results for input(s): NA, K, CL, CO2, BUN, CREATININE, CALCIUM, PROT, BILITOT, ALKPHOS, ALT, AST, GLUCOSE in the last 168 hours.  Invalid input(s): LABALBU Lab Results  Component Value Date   CHOL 113 04/21/2016   HDL 27.10 (L) 04/21/2016   LDLCALC 57 04/21/2016   TRIG 143.0 04/21/2016   Lab Results  Component Value Date   DDIMER 0.72 (H) 07/17/2012     Radiology Studies    No results found.  ECG & Cardiac Imaging    EKG: SR with anteroseptal ST  elevation  Assessment & Plan    72 yo male with PMH of CAD s/p previous stents in the PDA/LAD, HTN, HL, IDDM, Pancreatitis and chronic systolic HF who presented with chest pain that started around 1230pm after lunch today.   1. Acute anterior STEMI: Developed chest pressure around 12:30pm after eating lunch. EKG in the field showed acute ST elevation in the anteroseptal leads. Given 324mg  ASA and 1 SL nitro. Brought directly to the cath lab.   2. IDDM: SSI while inpatient -- check Hgb A1c  3. HL: on Zocor as outpatient. Intolerant to Lipitor 2/2 to myalgias in the past.   4. Chronic systolic HF: Had decline in EF to 35% in 2013, but improved to 55% on echo in 2014. No signs of heart failure on exam.   Severity of Illness: The appropriate patient status for this patient is INPATIENT. Inpatient status is judged to be reasonable and necessary in order to provide the required intensity of service to ensure the patient's safety. The patient's presenting symptoms, physical exam findings, and initial radiographic and laboratory data in the context of their chronic comorbidities is felt to place them at high risk for further clinical deterioration. Furthermore, it is not anticipated that the patient will be medically stable for discharge from the hospital within 2 midnights of admission. The following factors support the patient status of inpatient.   " The patient's presenting symptoms include chest pain. " The worrisome physical exam findings include pale. " The initial radiographic and laboratory data are worrisome because of Acute ST elevation in anteroseptal leads. " The chronic co-morbidities include HTN, HL, IDDM.   * I certify that at the point of admission it is my clinical judgment that the patient will require inpatient hospital care spanning beyond 2 midnights from the point of admission due to high intensity of service, high risk for further deterioration  and high frequency of  surveillance required.*    Signed, Reino Bellis, NP-C Pager [phone] 12/20/2017, 3:00 PM

## 2017-12-21 ENCOUNTER — Encounter (HOSPITAL_COMMUNITY): Payer: Self-pay | Admitting: Cardiology

## 2017-12-21 ENCOUNTER — Inpatient Hospital Stay (HOSPITAL_COMMUNITY): Payer: Medicare Other

## 2017-12-21 DIAGNOSIS — E782 Mixed hyperlipidemia: Secondary | ICD-10-CM

## 2017-12-21 DIAGNOSIS — I10 Essential (primary) hypertension: Secondary | ICD-10-CM

## 2017-12-21 DIAGNOSIS — I5022 Chronic systolic (congestive) heart failure: Secondary | ICD-10-CM

## 2017-12-21 DIAGNOSIS — I509 Heart failure, unspecified: Secondary | ICD-10-CM

## 2017-12-21 LAB — LIPID PANEL
CHOL/HDL RATIO: 4.3 ratio
CHOLESTEROL: 102 mg/dL (ref 0–200)
HDL: 24 mg/dL — AB (ref >40)
LDL Cholesterol: 64 mg/dL (ref 0–99)
TRIGLYCERIDES: 70 mg/dL (ref <150)
VLDL: 14 mg/dL (ref 0–40)

## 2017-12-21 LAB — BASIC METABOLIC PANEL
ANION GAP: 7 (ref 5–15)
BUN: 15 mg/dL (ref 6–20)
CALCIUM: 8.2 mg/dL — AB (ref 8.9–10.3)
CHLORIDE: 106 mmol/L (ref 101–111)
CO2: 25 mmol/L (ref 22–32)
Creatinine, Ser: 0.99 mg/dL (ref 0.61–1.24)
GFR calc non Af Amer: >60 mL/min (ref >60)
GLUCOSE: 212 mg/dL — AB (ref 65–99)
POTASSIUM: 3.4 mmol/L — AB (ref 3.5–5.1)
Sodium: 138 mmol/L (ref 135–145)

## 2017-12-21 LAB — HEMOGLOBIN A1C
Hgb A1c MFr Bld: 10 % — ABNORMAL HIGH (ref 4.8–5.6)
Mean Plasma Glucose: 240.3 mg/dL

## 2017-12-21 LAB — CBC
HEMATOCRIT: 33 % — AB (ref 39.0–52.0)
HEMOGLOBIN: 10.7 g/dL — AB (ref 13.0–17.0)
MCH: 29.6 pg (ref 26.0–34.0)
MCHC: 32.4 g/dL (ref 30.0–36.0)
MCV: 91.4 fL (ref 78.0–100.0)
Platelets: 228 10*3/uL (ref 150–400)
RBC: 3.61 MIL/uL — AB (ref 4.22–5.81)
RDW: 13.3 % (ref 11.5–15.5)
WBC: 7.7 10*3/uL (ref 4.0–10.5)

## 2017-12-21 LAB — ECHOCARDIOGRAM COMPLETE
HEIGHTINCHES: 72 in
WEIGHTICAEL: 3386.27 [oz_av]

## 2017-12-21 LAB — GLUCOSE, CAPILLARY
Glucose-Capillary: 68 mg/dL (ref 65–99)
Glucose-Capillary: 202 mg/dL — ABNORMAL HIGH (ref 65–99)
Glucose-Capillary: 204 mg/dL — ABNORMAL HIGH (ref 65–99)
Glucose-Capillary: 113 mg/dL — ABNORMAL HIGH (ref 65–99)

## 2017-12-21 LAB — MRSA PCR SCREENING: MRSA by PCR: NEGATIVE

## 2017-12-21 MED ORDER — OXYCODONE HCL 5 MG PO TABS
5.0000 mg | ORAL_TABLET | ORAL | Status: DC | PRN
  Administered 2017-12-21 (×2): 5 mg via ORAL
  Filled 2017-12-21 (×2): qty 1

## 2017-12-21 MED ORDER — PERFLUTREN LIPID MICROSPHERE
1.0000 mL | INTRAVENOUS | Status: AC | PRN
  Filled 2017-12-21: qty 10

## 2017-12-21 MED ORDER — ROSUVASTATIN CALCIUM 40 MG PO TABS
40.0000 mg | ORAL_TABLET | Freq: Every day | ORAL | Status: DC
  Administered 2017-12-21 – 2017-12-24 (×4): 40 mg via ORAL
  Filled 2017-12-21 (×4): qty 1

## 2017-12-21 MED ORDER — PERFLUTREN LIPID MICROSPHERE
INTRAVENOUS | Status: AC
  Administered 2017-12-21: 2 mL
  Filled 2017-12-21: qty 10

## 2017-12-21 MED FILL — Heparin Sodium (Porcine) 2 Unit/ML in Sodium Chloride 0.9%: INTRAMUSCULAR | Qty: 1000 | Status: AC

## 2017-12-21 NOTE — Progress Notes (Signed)
CARDIAC REHAB PHASE I   PRE:  Rate/Rhythm: 86 SR    BP: sitting 99/70    SaO2:   MODE:  Ambulation: 370 ft   POST:  Rate/Rhythm: 103 ST    BP: sitting 115/74     SaO2:   Tolerated well, no c/o except slight lightheadedness. BP low. Denied CP. Ed completed with pt. He is very talkative but receptive. Sts he understands importance of Brilinta. I will send referral to [facility]. Pt admits that he has been a couch potato since retirement 2 years ago.  [phone]   [place], ACSM 12/21/2017 12:07 PM

## 2017-12-21 NOTE — Progress Notes (Signed)
Cardiology paged for K 3.4. Will continue to monitor.

## 2017-12-21 NOTE — Progress Notes (Signed)
*  PRELIMINARY RESULTS* Echocardiogram 2D Echocardiogram with definity has been performed.  David Lane 12/21/2017, 4:08 PM

## 2017-12-21 NOTE — Progress Notes (Addendum)
Progress Note  Patient Name: David Lane Date of Encounter: 12/21/2017  Primary Cardiologist: [name], MD   Subjective   Had recurrent chest discomfort similar to presentation last evening.  No discomfort currently.  Symptom onset was sudden after eating lunch on yesterday.  Had been doing well until that time.  Inpatient Medications    Scheduled Meds: . aspirin  81 mg Oral Daily  . enoxaparin (LOVENOX) injection  40 mg Subcutaneous Q24H  . insulin aspart  0-15 Units Subcutaneous TID WC  . lisinopril  20 mg Oral BID  . multivitamin with minerals  1 tablet Oral Daily  . nebivolol  20 mg Oral Daily  . pantoprazole  40 mg Oral Daily  . simvastatin  40 mg Oral QHS  . sodium chloride flush  3 mL Intravenous Q12H  . spironolactone  50 mg Oral Daily  . ticagrelor  90 mg Oral BID   Continuous Infusions: . sodium chloride Stopped (12/20/17 1800)   PRN Meds: sodium chloride, acetaminophen, ondansetron (ZOFRAN) IV, oxyCODONE, sodium chloride flush   Vital Signs    Vitals:   12/21/17 0402 12/21/17 0500 12/21/17 0600 12/21/17 0757  BP:  (!) 105/59 (!) 117/58   Pulse:  78 90   Resp:  (!) 24 (!) 23   Temp: 98.9 F (37.2 C)   98.4 F (36.9 C)  TempSrc: Oral   Oral  SpO2:  96% 95%   Weight:      Height:        Intake/Output Summary (Last 24 hours) at 12/21/2017 1031 Last data filed at 12/21/2017 0100 Gross per 24 hour  Intake 668.75 ml  Output 900 ml  Net -231.25 ml   Filed Weights   12/20/17 1800  Weight: 211 lb 10.3 oz (96 kg)    Telemetry    NSR - Personally Reviewed  ECG    Sinus rhythm with EKG evidence of large acute infarction with Q waves V1 through V4 as well as in the inferior leads II, III, and aVF.  Extensive Q waves compared to prior preinfarction EKG.- Personally Reviewed  Physical Exam  Sitting in chair.  No distress.  Denies dyspnea. GEN: No acute distress.   Neck: No JVD Cardiac: RRR, no murmurs, rubs, or gallops.  Respiratory:  Clear to auscultation bilaterally. GI: Soft, nontender, non-distended  MS: No edema; No deformity. Neuro:  Nonfocal  Psych: Normal affect   Labs    Chemistry Recent Labs  Lab 12/20/17 1501 12/20/17 1502 12/21/17 0345  NA 138 141 138  K 3.9 4.0 3.4*  CL 108 105 106  CO2 21*  --  25  GLUCOSE 219* 224* 212*  BUN 20 19 15   CREATININE 1.01 0.90 0.99  CALCIUM 8.2*  --  8.2*  PROT 5.8*  --   --   ALBUMIN 3.1*  --   --   AST 25  --   --   ALT 24  --   --   ALKPHOS 65  --   --   BILITOT 0.7  --   --   GFRNONAA >60  --  >60  GFRAA >60  --  >60  ANIONGAP 9  --  7     Hematology Recent Labs  Lab 12/20/17 1501 12/20/17 1502 12/21/17 0345  WBC 9.0  --  7.7  RBC 3.56*  --  3.61*  HGB 10.8* 10.5* 10.7*  HCT 31.8* 31.0* 33.0*  MCV 89.3  --  91.4  MCH 30.3  --  29.6  MCHC 34.0  --  32.4  RDW 13.0  --  13.3  PLT 247  --  228    Cardiac Enzymes Recent Labs  Lab 12/20/17 1501  TROPONINI 0.15*   No results for input(s): TROPIPOC in the last 168 hours.   BNPNo results for input(s): BNP, PROBNP in the last 168 hours.   DDimer No results for input(s): DDIMER in the last 168 hours.   Radiology    No results found.  Cardiac Studies   STEMI coronary angios images 12/20/2017: Coronary Diagrams   Diagnostic Diagram       Post-Intervention Diagram        LVEDP was normal and EF estimated at 40% with anteroapical akinesis.  Patient Profile     72 y.o. male with PMH of CAD s/p previous stents in the PDA/LAD (prior stents include mid LAD-2013, distal circumflex, and L-PDA), HTN, HL, IDDM, Pancreatitis and chronic systolic HF who presented with chest pain that started around 1230pm after lunch today.   LAD stent thrombosis treated with PCI and restenting 12/20/2017.  Significant residual disease noted.   Assessment & Plan    1. Coronary artery disease with multiple prior stents presenting with anterior ST elevation myocardial infarction due to in-stent  restenosis/late-stent thrombosis.  Successfully treated with angioplasty and restenting.  Residual disease involving nondominant RCA and distal circumflex/PDA in-stent restenosis.  Also eccentric 50-70% first obtuse marginal stenosis.  Recurrent pain last night could be related to spasm or ischemia and other territories.  Will adjust medications and see how we do.  May need further interventional therapy if recurrent symptoms.  Otherwise plan to treat medically.  Will add long-acting nitrates for residual disease.  Ambulate with cardiac rehab. 2. Hyperlipidemia with LDL target less than 70.  Will switch simvastatin 40 mg to high intensity therapy with atorvastatin 80 mg daily. 3. Essential hypertension with target blood pressure 130/80 mmHg or less 4. Diabetes mellitus with target A1c less than 7. 5. Acute combined systolic and diastolic heart failure, presumed.  Prior history of HFrEF --> improved toHFpEF in 2014.  If EF is really again reduced will need to discontinue nebivolol and start guideline directed beta-blocker therapy for decreased LV function (metoprolol succinate, carvedilol, or bisoprolol).  Will check echocardiography.  For questions or updates, please contact [place] Please consult [email or web address] for contact info under Cardiology/STEMI.      Signed, Sinclair Grooms, MD  12/21/2017, 10:31 AM

## 2017-12-22 LAB — BASIC METABOLIC PANEL
ANION GAP: 7 (ref 5–15)
BUN: 13 mg/dL (ref 6–20)
CHLORIDE: 105 mmol/L (ref 101–111)
CO2: 27 mmol/L (ref 22–32)
Calcium: 8.5 mg/dL — ABNORMAL LOW (ref 8.9–10.3)
Creatinine, Ser: 0.99 mg/dL (ref 0.61–1.24)
GFR calc Af Amer: >60 mL/min (ref >60)
GFR calc non Af Amer: >60 mL/min (ref >60)
GLUCOSE: 205 mg/dL — AB (ref 65–99)
POTASSIUM: 4.3 mmol/L (ref 3.5–5.1)
Sodium: 139 mmol/L (ref 135–145)

## 2017-12-22 LAB — GLUCOSE, CAPILLARY
GLUCOSE-CAPILLARY: 292 mg/dL — AB (ref 65–99)
GLUCOSE-CAPILLARY: 177 mg/dL — AB (ref 65–99)
GLUCOSE-CAPILLARY: 155 mg/dL — AB (ref 65–99)
Glucose-Capillary: 209 mg/dL — ABNORMAL HIGH (ref 65–99)
Glucose-Capillary: 220 mg/dL — ABNORMAL HIGH (ref 65–99)

## 2017-12-22 LAB — TROPONIN I: Troponin I: 22.2 ng/mL (ref <0.03)

## 2017-12-22 MED ORDER — CARVEDILOL 3.125 MG PO TABS
3.1250 mg | ORAL_TABLET | Freq: Two times a day (BID) | ORAL | Status: AC
Start: 2017-12-22 — End: 2017-12-22
  Administered 2017-12-22: 3.125 mg via ORAL
  Filled 2017-12-22: qty 1

## 2017-12-22 MED ORDER — LOPERAMIDE HCL 2 MG PO CAPS
4.0000 mg | ORAL_CAPSULE | ORAL | Status: DC | PRN
  Administered 2017-12-22 – 2017-12-23 (×2): 4 mg via ORAL
  Filled 2017-12-22 (×2): qty 2

## 2017-12-22 MED ORDER — SPIRONOLACTONE 25 MG PO TABS
25.0000 mg | ORAL_TABLET | Freq: Every day | ORAL | Status: DC
  Administered 2017-12-23: 25 mg via ORAL
  Filled 2017-12-22: qty 1

## 2017-12-22 MED ORDER — INSULIN GLARGINE 100 UNIT/ML ~~LOC~~ SOLN
20.0000 [IU] | Freq: Every day | SUBCUTANEOUS | Status: DC
  Administered 2017-12-22 – 2017-12-23 (×2): 20 [IU] via SUBCUTANEOUS
  Filled 2017-12-22 (×2): qty 0.2

## 2017-12-22 MED ORDER — CARVEDILOL 6.25 MG PO TABS
6.2500 mg | ORAL_TABLET | Freq: Two times a day (BID) | ORAL | Status: DC
  Administered 2017-12-23: 6.25 mg via ORAL
  Filled 2017-12-22: qty 1

## 2017-12-22 NOTE — Progress Notes (Signed)
Inpatient Diabetes Program Recommendations  AACE/ADA: New Consensus Statement on Inpatient Glycemic Control (2015)  Target Ranges:  Prepandial:   less than 140 mg/dL      Peak postprandial:   less than 180 mg/dL (1-2 hours)      Critically ill patients:  140 - 180 mg/dL   Lab Results  Component Value Date   GLUCAP 209 (H) 12/22/2017   HGBA1C 10.0 (H) 12/21/2017    Review of Glycemic Control Results for David Lane, David Lane (MRN [id number]) as of 12/22/2017 09:00  Ref. Range 12/21/2017 12:48 12/21/2017 16:00 12/21/2017 19:33 12/21/2017 23:56 12/22/2017 07:46  Glucose-Capillary Latest Ref Range: 65 - 99 mg/dL 202 (H) 204 (H) 113 (H) 220 (H) 209 (H)   Diabetes history: DM2 Outpatient Diabetes medications: Lantus 80 units qd Current orders for Inpatient glycemic control: Novolog moderate correction tid  Inpatient Diabetes Program Recommendations:   -Start portion of home Lantus dose -Lantus 20 units qd   Thank you, Nani Gasser. Taevin Mcferran, RN, MSN, CDE  Diabetes Coordinator Inpatient Glycemic Control Team Team Pager [phone] (8am-5pm) 12/22/2017 9:19 AM

## 2017-12-22 NOTE — Progress Notes (Signed)
Inpatient Diabetes Program Recommendations  AACE/ADA: New Consensus Statement on Inpatient Glycemic Control (2015)  Target Ranges:  Prepandial:   less than 140 mg/dL      Peak postprandial:   less than 180 mg/dL (1-2 hours)      Critically ill patients:  140 - 180 mg/dL   Lab Results  Component Value Date   GLUCAP 292 (H) 12/22/2017   HGBA1C 10.0 (H) 12/21/2017    Review of Glycemic Control Results for SINAN, TUCH (MRN [id number]) as of 12/22/2017 13:52  Ref. Range 12/21/2017 16:00 12/21/2017 19:33 12/21/2017 23:56 12/22/2017 07:46 12/22/2017 11:26  Glucose-Capillary Latest Ref Range: 65 - 99 mg/dL 204 (H) 113 (H) 220 (H) 209 (H) 292 (H)   Diabetes history: DM2 Outpatient Diabetes medications: Lantus 80 units qd Current orders for Inpatient glycemic control: Lantus 20 units + Novolog moderate correction tid  Inpatient Diabetes Program Recommendations:    -Add Novolog 5 units tid meal coverage if eats 50% -Add Novolog hs correction 0-5 units  Spoke with pt @ bedside about A1C results with them and explained what an A1C is, basic pathophysiology of DM Type 2, basic home care, basic diabetes diet nutrition principles, importance of checking CBGs and maintaining good CBG control to prevent long-term and short-term complications. Reviewed signs and symptoms of hyperglycemia and hypoglycemia and how to treat hypoglycemia at home. Also reviewed blood sugar goals at home.  RNs to provide ongoing basic DM education at bedside with this patient. Have ordered educational booklet, insulin starter kit, and DM videos. Have also placed RD consult for DM diet education for this patient.  Patient states that he has not wanted to take but one injection per day but now understands that he may need to have a different regimen in order to cover his meal times better. States Dr. Loanne Drilling had suggested another regimen but pt. has not started a different regimen yet. Requested patient to follow up with Dr.  Loanne Drilling as soon as possible post discharge.   Thank you, Nani Gasser. Chanson Teems, RN, MSN, CDE  Diabetes Coordinator Inpatient Glycemic Control Team Team Pager [phone] (8am-5pm) 12/22/2017 2:02 PM

## 2017-12-22 NOTE — Progress Notes (Signed)
Per insurance check on Brilinta  # 1.  S/W PAMELA  @ [employer] # [phone]    BRILINTA  90 MG BID  COVER- YES  CO-PAY- $ 37.00  TIER- 3 DRUG  PRIOR APPROVAL- NO   PREFERRED PHARMACY - YES- [facility]

## 2017-12-22 NOTE — Care Management Note (Signed)
Case Management Note Marvetta Gibbons RN, BSN Unit 4E-Case Manager-- [place] coverage [phone]  Patient Details  Name: David Lane MRN: [id number] Date of Birth: June 12, 1946  Subjective/Objective:    Pt admitted with STEMI, s/p DES                Action/Plan: PTA pt lived at home, independent- referral received for Brilinta needs- per insurance check- copay cost $37- spoke with pt at bedside- coverage info shared- per pt he uses [facility]- now [facility]- 30 day free card provided to pt- and encourage pt to call toll free # for further assistance with Brilinta. Pt has PCP, looking into establishing with the [facility] for medication needs- questions answered regarding this. no further CM needs noted.   Expected Discharge Date:  12/22/17               Expected Discharge Plan:  Home/Self Care  In-House Referral:     Discharge planning Services  CM Consult, Medication Assistance  Post Acute Care Choice:    Choice offered to:     DME Arranged:    DME Agency:     HH Arranged:    HH Agency:     Status of Service:  In process, will continue to follow  If discussed at Long Length of Stay Meetings, dates discussed:     Discharge Disposition: home/self care   Additional Comments:  Dawayne Patricia, RN 12/22/2017, 3:33 PM

## 2017-12-22 NOTE — Progress Notes (Signed)
CARDIAC REHAB PHASE I   PRE:  Rate/Rhythm: 75 SR    BP: sitting 115/67    SaO2:   MODE:  Ambulation: 740 ft   POST:  Rate/Rhythm: 86 SR    BP: sitting 94/68     SaO2:   Tolerated well with fairly quick pace. Pt talked entire walk. No c/o when asked. BP sliglhtly lower but just got meds. Denied dizziness. Reviewed ed. Needs CM. Encouraged more walking. [phone]   [place], ACSM 12/22/2017 10:34 AM

## 2017-12-22 NOTE — Progress Notes (Addendum)
The patient has been seen in conjunction with Kathi Ludwig, MD. All aspects of care have been considered and discussed. The patient has been personally interviewed, examined, and all clinical data has been reviewed.   LVEF by echo 35-40%.  Patient has been on relatively high dose spironolactone and diastolic as an outpatient.  Plan to institute therapy for reduced LV function.  Decrease spironolactone to 25 mg/day was recommended today but he has been on 50 mg along with lisinopril as an outpatient..  Depending upon potassium and kidney function may need to further adjust (i.e go back to 50 mg).  Continue ACE inhibitor and switch nebivolol to carvedilol.  Ambulate and if stable possible discharge in a.m.  Will need to pay close attention to the kidney function and potassium since taking ACE inhibitor and Spironolactone.  Progress Note  Patient Name: David Lane Date of Encounter: 12/22/2017  Primary Cardiologist: [name], MD   Subjective   The patient was sleeping in his bed this morning upon entering the room. He denied chest pain overnight stating that he has felt well since the pain resolved during the cath with the exception of the mild pain experienced over the first night. He denied acute complaints.  Inpatient Medications    Scheduled Meds: . aspirin  81 mg Oral Daily  . enoxaparin (LOVENOX) injection  40 mg Subcutaneous Q24H  . insulin aspart  0-15 Units Subcutaneous TID WC  . lisinopril  20 mg Oral BID  . multivitamin with minerals  1 tablet Oral Daily  . nebivolol  20 mg Oral Daily  . pantoprazole  40 mg Oral Daily  . rosuvastatin  40 mg Oral q1800  . sodium chloride flush  3 mL Intravenous Q12H  . spironolactone  50 mg Oral Daily  . ticagrelor  90 mg Oral BID   Continuous Infusions: . sodium chloride Stopped (12/20/17 1800)   PRN Meds: sodium chloride, acetaminophen, loperamide, ondansetron (ZOFRAN) IV, oxyCODONE, sodium chloride flush    Vital Signs    Vitals:   12/22/17 0100 12/22/17 0235 12/22/17 0422 12/22/17 0504  BP: 122/72 127/78  99/80  Pulse:      Resp: 14 20  (!) 24  Temp:   98.6 F (37 C)   TempSrc:   Oral   SpO2:    96%  Weight:      Height:        Intake/Output Summary (Last 24 hours) at 12/22/2017 0657 Last data filed at 12/21/2017 1700 Gross per 24 hour  Intake 480 ml  Output 2500 ml  Net -2020 ml   Filed Weights   12/20/17 1800  Weight: 96 kg (211 lb 10.3 oz)    Telemetry    NSR, rare PVC's - Personally Reviewed  ECG    No new tracing - Personally Reviewed  Physical Exam   GEN: No acute distress. Resting comfortably in bed, alert and oriented x3 Neck: No JVD Cardiac: RRR, no murmurs, rubs. Gallops auscultated  Respiratory: Clear to auscultation bilaterally. GI: Soft, nontender, non-distended  MS: No edema; No deformity. Neuro:  Nonfocal  Psych: Normal affect   Labs    Chemistry Recent Labs  Lab 12/20/17 1501 12/20/17 1502 12/21/17 0345  NA 138 141 138  K 3.9 4.0 3.4*  CL 108 105 106  CO2 21*  --  25  GLUCOSE 219* 224* 212*  BUN _0 CREATININE 1.01 0.90 0.99  CALCIUM 8.2*  --  8.2*  PROT 5.8*  --   --  ALBUMIN 3.1*  --   --   AST 25  --   --   ALT 24  --   --   ALKPHOS 65  --   --   BILITOT 0.7  --   --   GFRNONAA >60  --  >60  GFRAA >60  --  >60  ANIONGAP 9  --  7     Hematology Recent Labs  Lab 12/20/17 1501 12/20/17 1502 12/21/17 0345  WBC 9.0  --  7.7  RBC 3.56*  --  3.61*  HGB 10.8* 10.5* 10.7*  HCT 31.8* 31.0* 33.0*  MCV 89.3  --  91.4  MCH 30.3  --  29.6  MCHC 34.0  --  32.4  RDW 13.0  --  13.3  PLT 247  --  228    Cardiac Enzymes Recent Labs  Lab 12/20/17 1501  TROPONINI 0.15*   No results for input(s): TROPIPOC in the last 168 hours.   BNPNo results for input(s): BNP, PROBNP in the last 168 hours.   DDimer No results for input(s): DDIMER in the last 168 hours.   Radiology    No results found.  Cardiac Studies    Echocardiogram 12/22/2017: Study Conclusions - Left ventricle: The cavity size was normal. Wall thickness was   increased in a pattern of mild LVH. Apical anterior and apical   inferior akinesis. Mid to apical anteroseptal akinesis. Akinesis   of the apex. Systolic function was moderately reduced. The   estimated ejection fraction was in the range of 35% to 40%.   Doppler parameters are consistent with abnormal left ventricular   relaxation (grade 1 diastolic dysfunction). - Aortic valve: There was trivial regurgitation. - Aorta: Mildly dilated ascending aorta to 4.0 cm. - Mitral valve: There was no significant regurgitation. - Right ventricle: The cavity size was normal. Systolic function   was normal. - Tricuspid valve: Peak RV-RA gradient (S): 17 mm Hg. - Pulmonary arteries: PA peak pressure: 20 mm Hg (S). - Inferior vena cava: The vessel was normal in size. The   respirophasic diameter changes were in the normal range (>= 50%),   consistent with normal central venous pressure.  Impressions: - Normal LV size with mild LV hypertrophy. Wall motion   abnormalities as noted above. EF 35-40%. Normal RV size and   systolic function. No significant valvular abnormalities.  STEMI coronary angios images 12/20/2017: Diagnostic Diagram       Post-Intervention Diagram        LVEDP was normal and EF estimated at 40% with anteroapical akinesis.  Patient Profile     72 y.o. male with a PMHx notable for CAD s/p previous stents in the PDA/LAD (prior stents included mid LAD-2013, distal circumflex, and L-PDA), HTN, HLD, IDDM, pancreatitis and HFrEF who presented with chest pain that began abruptly at rest while eating lunch. This was accompanied by generalized malaise, dyspnea and reminded him of previous ACS like chest pain. He underwent a LHC which noted LAD stent thrombosis treated with PCI and resenting 12/20/2017. Significant residual disease noted. He progressed well over the  course of his stay and was encouraged to continue medical management.    Assessment & Plan    STEMI: CAD with multiple prior stents with anterior ST elevation MI due to restenosis and thrombosis of the LAD. He may need further intervention if chest pain resumes. Will medically manage otherwise.  Troponin 22.2, this is expected but give the resolution of his chest pain no current intervention  is recommended. Will need to add a long acting nitrate when his pressure improves. Continue ASA/Brilinta  Carvedilol 3.125-discontinued nebivolol  Lisinopril 41m BID  HLD: Target LDL <70 met with LDL 64, HDL of 24 concerning. Will encourage activity. Now on high intensity statin with rosuvastatin 460mdaily. Will need to follow-up in 6-8 weeks for lipid profile.  Essential HTN: Target pressure <130/80. Patient hypotensive this am. Will monitor.   DMII: Ordered HgbA1c-->returned at 10.0%. He will need tight glycemic regulation and close follow-up with his PCP to reduce his cardiovascular risk from diabetes. Patient may benefit from a low dose of long acting insulin.  Placed order for diabetes outpatient counselor consult. Agree with recs.  --Continue SSI moderate --Lantus 20 units QHS, a fraction of his 80units at home  HFrEF: Echo demonstrating reduced EF of 35-40% with associated G1DD and apical anterior, apical inferior akinesis, mid to apical anteroseptal akinesis and akinesisa of the apex. --Spironolactone 5052maily decreased to 66m76mily tomorrow --Stopping nebivolol in favor of a guideline approved BB in the setting or HFrEF --Carvedilol 3.125 initiated  --Lisinopril 20mg6m --BMP-WNLs except glucose 205  For questions or updates, please contact [facility] consult [email or web address] for contact info under Cardiology/STEMI.  Please see the attending note/attestation for current A/P.   Signed, LawreKathi Ludwig 12/22/2017, 6:57 AM   Pager# [phone]

## 2017-12-22 NOTE — Progress Notes (Signed)
CRITICAL VALUE ALERT  Critical Value:  Troponin 22.2  Date & Time Notied:  12/22/17 at 1016  Provider Notified: Paged NP Glenford Peers NP  Orders Received/Actions taken: Pending   No new orders at this time... Will continue to monitor... Pt is asymptomatic.

## 2017-12-23 DIAGNOSIS — Z955 Presence of coronary angioplasty implant and graft: Secondary | ICD-10-CM

## 2017-12-23 DIAGNOSIS — K8689 Other specified diseases of pancreas: Secondary | ICD-10-CM

## 2017-12-23 DIAGNOSIS — E7849 Other hyperlipidemia: Secondary | ICD-10-CM

## 2017-12-23 DIAGNOSIS — E089 Diabetes mellitus due to underlying condition without complications: Secondary | ICD-10-CM

## 2017-12-23 LAB — GLUCOSE, CAPILLARY
GLUCOSE-CAPILLARY: 202 mg/dL — AB (ref 65–99)
GLUCOSE-CAPILLARY: 47 mg/dL — AB (ref 65–99)
GLUCOSE-CAPILLARY: 382 mg/dL — AB (ref 65–99)
Glucose-Capillary: 234 mg/dL — ABNORMAL HIGH (ref 65–99)
Glucose-Capillary: 62 mg/dL — ABNORMAL LOW (ref 65–99)
Glucose-Capillary: 189 mg/dL — ABNORMAL HIGH (ref 65–99)

## 2017-12-23 LAB — BASIC METABOLIC PANEL
ANION GAP: 8 (ref 5–15)
BUN: 13 mg/dL (ref 6–20)
CALCIUM: 8.1 mg/dL — AB (ref 8.9–10.3)
CHLORIDE: 105 mmol/L (ref 101–111)
CO2: 22 mmol/L (ref 22–32)
Creatinine, Ser: 1.12 mg/dL (ref 0.61–1.24)
GFR calc non Af Amer: >60 mL/min (ref >60)
Glucose, Bld: 233 mg/dL — ABNORMAL HIGH (ref 65–99)
Potassium: 4.1 mmol/L (ref 3.5–5.1)
Sodium: 135 mmol/L (ref 135–145)

## 2017-12-23 LAB — TROPONIN I: Troponin I: 13.53 ng/mL (ref <0.03)

## 2017-12-23 MED ORDER — CARVEDILOL 6.25 MG PO TABS: 6.2500 mg | ORAL_TABLET | Freq: Two times a day (BID) | ORAL | 1 refills | Status: DC

## 2017-12-23 MED ORDER — ROSUVASTATIN CALCIUM 40 MG PO TABS: 40.0000 mg | ORAL_TABLET | Freq: Every day | ORAL | 1 refills | Status: DC

## 2017-12-23 MED ORDER — LISINOPRIL 20 MG PO TABS: 20.0000 mg | ORAL_TABLET | Freq: Every day | ORAL | 0 refills | Status: DC

## 2017-12-23 MED ORDER — CARVEDILOL 3.125 MG PO TABS
3.1250 mg | ORAL_TABLET | Freq: Two times a day (BID) | ORAL | Status: DC
  Administered 2017-12-23 – 2017-12-25 (×3): 3.125 mg via ORAL
  Filled 2017-12-23 (×3): qty 1

## 2017-12-23 MED ORDER — INSULIN ASPART 100 UNIT/ML ~~LOC~~ SOLN
0.0000 [IU] | Freq: Three times a day (TID) | SUBCUTANEOUS | Status: DC
  Administered 2017-12-23 (×2): 5 [IU] via SUBCUTANEOUS

## 2017-12-23 MED ORDER — LISINOPRIL 20 MG PO TABS: 20.0000 mg | ORAL_TABLET | Freq: Every day | ORAL | Status: DC

## 2017-12-23 MED ORDER — DEXTROSE 50 % IV SOLN
INTRAVENOUS | Status: AC
  Administered 2017-12-23: 25 mL
  Filled 2017-12-23: qty 50

## 2017-12-23 MED ORDER — INSULIN ASPART 100 UNIT/ML ~~LOC~~ SOLN
5.0000 [IU] | Freq: Three times a day (TID) | SUBCUTANEOUS | Status: DC
  Administered 2017-12-23 (×2): 5 [IU] via SUBCUTANEOUS

## 2017-12-23 MED ORDER — CARVEDILOL 3.125 MG PO TABS: 3.1250 mg | ORAL_TABLET | Freq: Two times a day (BID) | ORAL | 1 refills | Status: DC

## 2017-12-23 MED ORDER — TICAGRELOR 90 MG PO TABS: 90.0000 mg | ORAL_TABLET | Freq: Two times a day (BID) | ORAL | 11 refills | Status: DC

## 2017-12-23 MED ORDER — INSULIN ASPART 100 UNIT/ML ~~LOC~~ SOLN: 0.0000 [IU] | Freq: Every day | SUBCUTANEOUS | Status: DC

## 2017-12-23 NOTE — Discharge Instructions (Signed)
Please call your primary care physician and schedule a follow-up as soon as possible for assistance with your diabetic regimen. I am concerned with the HgA1c of 10.0 and your recent heart attack. The uncontrolled diabetes and lack of activity can lead to worsening heart disease. Please also follow-up with the appointments that have been set for you with cardiology.

## 2017-12-23 NOTE — Progress Notes (Addendum)
Progress Note  Patient Name: David Lane Date of Encounter: 12/23/2017  Primary Cardiologist: [name], MD    Subjective   The patient denied chest pain today. He was resting comfortably in his bed today upon entering the room. He is denying complaints currently other than continued, howbeit improved, chronic diarrhea.   Inpatient Medications    Scheduled Meds: . aspirin  81 mg Oral Daily  . carvedilol  6.25 mg Oral BID WC  . enoxaparin (LOVENOX) injection  40 mg Subcutaneous Q24H  . insulin aspart  0-15 Units Subcutaneous TID WC  . insulin glargine  20 Units Subcutaneous QHS  . lisinopril  20 mg Oral BID  . multivitamin with minerals  1 tablet Oral Daily  . pantoprazole  40 mg Oral Daily  . rosuvastatin  40 mg Oral q1800  . sodium chloride flush  3 mL Intravenous Q12H  . spironolactone  25 mg Oral Daily  . ticagrelor  90 mg Oral BID   Continuous Infusions: . sodium chloride Stopped (12/20/17 1800)   PRN Meds: sodium chloride, acetaminophen, loperamide, ondansetron (ZOFRAN) IV, oxyCODONE, sodium chloride flush   Vital Signs    Vitals:   12/22/17 1940 12/22/17 2221 12/22/17 2337 12/23/17 0343  BP:  130/77 109/69   Pulse:      Resp:  17 17   Temp: (!) 97.4 F (36.3 C)  98.5 F (36.9 C) 98.7 F (37.1 C)  TempSrc: Oral  Oral Oral  SpO2:   96%   Weight:      Height:       No intake or output data in the 24 hours ending 12/23/17 0646 Filed Weights   12/20/17 1800  Weight: 96 kg (211 lb 10.3 oz)    Telemetry    NSR - Personally Reviewed  ECG    No recent tracings - Personally Reviewed  Physical Exam  In no acute distress. Resting comfortably. Freely conversant  GEN: No acute distress.   Neck: No JVD Cardiac: RRR, no murmurs, rubs. Audible gallop  Respiratory: Clear to auscultation bilaterally. GI: Soft, nontender, non-distended  MS: No edema; No deformity. Neuro:  Nonfocal  Psych: Normal affect   Labs    Chemistry Recent Labs  Lab  12/20/17 1501  12/21/17 0345 12/22/17 0821 12/23/17 0205  NA 138   < > 138 139 135  K 3.9   < > 3.4* 4.3 4.1  CL 108   < > 106 105 105  CO2 21*  --  _0 GLUCOSE 219*   < > 212* 205* 233*  BUN 20   < > _1 CREATININE 1.01   < > 0.99 0.99 1.12  CALCIUM 8.2*  --  8.2* 8.5* 8.1*  PROT 5.8*  --   --   --   --   ALBUMIN 3.1*  --   --   --   --   AST 25  --   --   --   --   ALT 24  --   --   --   --   ALKPHOS 65  --   --   --   --   BILITOT 0.7  --   --   --   --   GFRNONAA >60  --  >60 >60 >60  GFRAA >60  --  >60 >60 >60  ANIONGAP 9  --  _2 < > = values in this interval not displayed.  Hematology Recent Labs  Lab 12/20/17 1501 12/20/17 1502 12/21/17 0345  WBC 9.0  --  7.7  RBC 3.56*  --  3.61*  HGB 10.8* 10.5* 10.7*  HCT 31.8* 31.0* 33.0*  MCV 89.3  --  91.4  MCH 30.3  --  29.6  MCHC 34.0  --  32.4  RDW 13.0  --  13.3  PLT 247  --  228    Cardiac Enzymes Recent Labs  Lab 12/20/17 1501 12/22/17 0821 12/23/17 0205  TROPONINI 0.15* 22.20* 13.53*   No results for input(s): TROPIPOC in the last 168 hours.   BNPNo results for input(s): BNP, PROBNP in the last 168 hours.   DDimer No results for input(s): DDIMER in the last 168 hours.   Radiology    No results found.  Cardiac Studies    Echocardiogram 12/22/2017: Study Conclusions - Left ventricle: The cavity size was normal. Wall thickness was increased in a pattern of mild LVH. Apical anterior and apical inferior akinesis. Mid to apical anteroseptal akinesis. Akinesis of the apex. Systolic function was moderately reduced. The estimated ejection fraction was in the range of 35% to 40%. Doppler parameters are consistent with abnormal left ventricular relaxation (grade 1 diastolic dysfunction). - Aortic valve: There was trivial regurgitation. - Aorta: Mildly dilated ascending aorta to 4.0 cm. - Mitral valve: There was no significant regurgitation. - Right ventricle: The  cavity size was normal. Systolic function was normal. - Tricuspid valve: Peak RV-RA gradient (S): 17 mm Hg. - Pulmonary arteries: PA peak pressure: 20 mm Hg (S). - Inferior vena cava: The vessel was normal in size. The respirophasic diameter changes were in the normal range (>= 50%), consistent with normal central venous pressure.  Impressions: - Normal LV size with mild LV hypertrophy. Wall motion abnormalities as noted above. EF 35-40%. Normal RV size and systolic function. No significant valvular abnormalities.  STEMI coronary angios images 12/20/2017: Diagnostic Diagram       Post-Intervention Diagram        LVEDP was normal and EF estimated at 40% with anteroapical akinesis.  Patient Profile     72 y.o. male with a PMHx notable for CAD s/p previous stents in the PDA/LAD (prior stents included mid LAD-2013, distal circumflex, and L-PDA), HTN, HLD, IDDM, pancreatitis and HFrEF who presented with chest pain that began abruptly at rest while eating lunch. This was accompanied by generalized malaise, dyspnea and reminded him of previous ACS like chest pain. He underwent a LHC which noted LAD stent thrombosis treated with PCI and resenting 12/20/2017. Significant residual disease noted. He progressed well over the course of his stay and was encouraged to continue medical management.    Assessment & Plan    STEMI: CAD with multiple prior stents with anterior ST elevation MI due to risk of restenosis and thrombus in the LAD.  Patient remains chest free since the night following his cath has been ambulating easily with cardiac rehab phase 1.  He appears stable for discharge home today at this time. Troponins peaked at 22.2. Continue ASA/Brilinta from a minimum of 1 year Continue carvedilol 25 mg twice daily Continue lisinopril 20 mg twice daily Continue spironolactone 25 mg daily  HLD: Target LDL and HDL met.  Will continue on rosuvastatin 40 mg daily.  Will need to  follow-up in 6-8 weeks for evaluation of the continued need for high intensity statin.  Essential hypertension: Target pressure of 130/80.  Patient continues to be mildly hypotensive at 108/64.  Insulin-dependent diabetes:  Patient been diabetic status post severe pancreatitis.  He continues to demonstrate elevated serum glucose levels while inpatient.  Most recent hemoglobin A1c was 10. Continued SSI moderate Continue Lantus 20 units nightly 5 units per meal coverage and nightly coverage ordered We will plan to discharge him home today on his home regimen approved by his endocrinologist. This will need follow-up and adjustments based on his HgbA1c of 10.0.  HFrEF: Echo demonstrated reduced EF of 30-40%. Continue Carvedilol, Lisinopril, spironolactone 63m daily   I have obtained follow-up appointments with his Cardiologist and for a BMP a few days prior.   For questions or updates, please contact [place] consult [email or web address] for contact info under Cardiology/STEMI.   Please see attending note/attestation for current A/P.    Signed, LKathi Ludwig MD Internal Medicine, PGY-1 Pager # [phone]

## 2017-12-23 NOTE — Progress Notes (Signed)
Hypoglycemic Event  CBG: 62 at 1519  Treatment: 15 GM carbohydrate snack (orange juice, peanut butter, graham crackers)  Symptoms: Pale, Sweaty, Shaky and Nervous/irritable  Follow-up CBG: Time:1538 CBG Result:47 (f/u w/25 ml of D50 per protocol) and f/u CBG was 128 at 1548  Possible Reasons for Event: Pt not used to medication regimen... MD aware   Comments/MD notified: Dr. Sharen Counter have been notified    David Lane

## 2017-12-23 NOTE — Progress Notes (Addendum)
Progress note   Notified by his nurse that after ambulation, the patient felt dizzy and blood pressure noted to be 75/50 mmHg.  Upon sitting pressure quickly returned to 105/63 mmHg.  ECG will be repeated.  Not currently available for review.  Suspect hypotension secondary to overly aggressive medical regimen.  Plan to observe blood pressure over the next several hours, ambulate after lunch, and decrease lisinopril to 20 mg/day, carvedilol to 3.125 mg twice daily, Spironolactone 25 mg/day and leave other therapy unchanged.

## 2017-12-23 NOTE — Progress Notes (Signed)
CARDIAC REHAB PHASE I   PRE:  Rate/Rhythm: 81 SR  BP:  Supine: 101/77  Sitting:   Standing:    SaO2: 98%RA  MODE:  Ambulation: 1110 ft   POST:  Rate/Rhythm: 90 SR  BP:  Supine:   Sitting: 110/70  Standing:    SaO2: 93%RA 1130-1200 Pt walked 1110 ft on RA with steady gait. Stated he was slightly SOB but pt able to converse during the walk. To bed after walk. Tolerated well.   Graylon Good, RN BSN  12/23/2017 11:54 AM

## 2017-12-23 NOTE — Progress Notes (Signed)
Progress Note  Attempted discharge x2 today.  Earlier this morning after ambulating 1-2 hours after a.m. medications, he developed hypotension at 75/50 mmHg.  Medication adjustments were made.  Please see prior note.  This afternoon in preparation for discharge, he developed hypoglycemia down to 40 mg/dL.  Hypoglycemia protocol was put into place.  Today's discharge has been counseled.  He will transfer to telemetry.  Diabetes therapy will be adjusted.  Hopeful discharge in a.m.

## 2017-12-23 NOTE — Progress Notes (Addendum)
The patient has been seen in conjunction with Kathi Ludwig, MD. All aspects of care have been considered and discussed. The patient has been personally interviewed, examined, and all clinical data has been reviewed.   Patient is clinically stable.  Long discussion concerning the importance of anti-platelets therapy as well as statin therapy and diabetes control.  Patient will remain on spironolactone 50 mg daily and lisinopril 20 mg twice daily as before admission.  Nebivolol and amlodipine have been discontinued and carvedilol 6.25 mg twice daily has been started.  High intensity statin therapy, rosuvastatin 40 mg daily has been started.  Will need basic metabolic panel in 5-7 days to ensure stable kidney function and potassium on this medication regimen.  TOC follow-up with Dr. Martinique or APP 7-10 days.  Phase 2 cardiac rehab.

## 2017-12-24 LAB — GLUCOSE, CAPILLARY
GLUCOSE-CAPILLARY: 322 mg/dL — AB (ref 65–99)
GLUCOSE-CAPILLARY: 426 mg/dL — AB (ref 65–99)
GLUCOSE-CAPILLARY: 294 mg/dL — AB (ref 65–99)
GLUCOSE-CAPILLARY: 219 mg/dL — AB (ref 65–99)

## 2017-12-24 MED ORDER — INSULIN GLARGINE 100 UNIT/ML ~~LOC~~ SOLN
15.0000 [IU] | Freq: Every day | SUBCUTANEOUS | Status: DC
  Administered 2017-12-24: 15 [IU] via SUBCUTANEOUS
  Filled 2017-12-24: qty 0.15

## 2017-12-24 MED ORDER — LISINOPRIL 10 MG PO TABS
10.0000 mg | ORAL_TABLET | Freq: Every day | ORAL | Status: DC
  Administered 2017-12-24 – 2017-12-25 (×2): 10 mg via ORAL
  Filled 2017-12-24 (×2): qty 1

## 2017-12-24 MED ORDER — SPIRONOLACTONE 12.5 MG HALF TABLET
12.5000 mg | ORAL_TABLET | Freq: Every day | ORAL | Status: DC
  Administered 2017-12-24: 12.5 mg via ORAL
  Filled 2017-12-24 (×2): qty 1

## 2017-12-24 MED ORDER — INSULIN ASPART 100 UNIT/ML ~~LOC~~ SOLN
0.0000 [IU] | Freq: Three times a day (TID) | SUBCUTANEOUS | Status: DC
Start: 2017-12-24 — End: 2017-12-25
  Administered 2017-12-24: 8 [IU] via SUBCUTANEOUS
  Administered 2017-12-24: 15 [IU] via SUBCUTANEOUS
  Administered 2017-12-25: 5 [IU] via SUBCUTANEOUS

## 2017-12-24 MED ORDER — INSULIN ASPART 100 UNIT/ML ~~LOC~~ SOLN: 0.0000 [IU] | Freq: Three times a day (TID) | SUBCUTANEOUS | Status: DC

## 2017-12-24 NOTE — Progress Notes (Signed)
Pt refuses labs to be taken this morning. Pt states that he is going home today and don't want labs drawn. Pt educated on the need for obtained lab work.

## 2017-12-24 NOTE — Progress Notes (Signed)
CARDIAC REHAB PHASE I   PRE:  Rate/Rhythm: 94 SR  BP:  Supine:   Sitting: 124/74  Standing: 96/65   SaO2: 100 RA  MODE:  Ambulation: 1110 ft   POST:  Rate/Rhythm: 101 ST  BP:  Supine:   Sitting: 104/55  Standing:    SaO2:  0925-1005 On arrival pt started talking as soon as I came in the room. He talks almost nonstop. It is very difficult to get in any conversation with him. I took BP sitting 124/74, and standing 96/65. He denies any dizziness. Pt tolerated ambulation well. His only c/o was of feeling like he has to take a deep breath. I explained to him that it was related to the Brilinta and it would pass with time. He comments that " I am not sure if I am better or not, I felt better after the stent and now I am having problems with my blood sugar and blood pressure." Pt talked nonstop while we were walking despite the c/o of SOB walking. Attempted to provide emotional support and encouragement. Rodney Langton RN 12/24/2017 10:04 AM

## 2017-12-24 NOTE — Progress Notes (Signed)
Progress Note  Patient Name: David Lane Date of Encounter: 12/24/2017  Primary Cardiologist: [name], MD    Subjective   Discharged postponed due to low BP yesterday.   This am refused am labs. Now walking hall with CR. Sugar was 46 and had to get glucose. Then sugar > 300. Says he is now not ready to go home. SBP 12 on sitting into 90s with standing. Carvedilol cut back. Denies CP or SOB.   Inpatient Medications    Scheduled Meds: . aspirin  81 mg Oral Daily  . carvedilol  3.125 mg Oral BID WC  . enoxaparin (LOVENOX) injection  40 mg Subcutaneous Q24H  . insulin glargine  20 Units Subcutaneous QHS  . lisinopril  20 mg Oral Daily  . multivitamin with minerals  1 tablet Oral Daily  . pantoprazole  40 mg Oral Daily  . rosuvastatin  40 mg Oral q1800  . sodium chloride flush  3 mL Intravenous Q12H  . spironolactone  25 mg Oral Daily  . ticagrelor  90 mg Oral BID   Continuous Infusions: . sodium chloride Stopped (12/20/17 1800)   PRN Meds: sodium chloride, acetaminophen, loperamide, ondansetron (ZOFRAN) IV, oxyCODONE, sodium chloride flush   Vital Signs    Vitals:   12/24/17 0340 12/24/17 0400 12/24/17 0801 12/24/17 0833  BP:  115/78  (!) 137/94  Pulse:  85  85  Resp:  19    Temp:  97.8 F (36.6 C) 98.6 F (37 C)   TempSrc:  Oral Oral   SpO2:  97%    Weight: 88.6 kg (195 lb 5.2 oz)     Height:        Intake/Output Summary (Last 24 hours) at 12/24/2017 0938 Last data filed at 12/24/2017 9417 Gross per 24 hour  Intake 600 ml  Output 1550 ml  Net -950 ml   Filed Weights   12/20/17 1800 12/24/17 0340  Weight: 96 kg (211 lb 10.3 oz) 88.6 kg (195 lb 5.2 oz)    Telemetry    NSR 80s - Personally Reviewed  ECG    No recent tracings - Personally Reviewed  Physical Exam   General:  Well appearing. No resp difficulty HEENT: normal Neck: supple. no JVD. Carotids 2+ bilat; no bruits. No lymphadenopathy or thryomegaly appreciated. Cor: PMI  nondisplaced. Regular rate & rhythm. No rubs, gallops or murmurs. Lungs: clear Abdomen: soft, nontender, nondistended. No hepatosplenomegaly. No bruits or masses. Good bowel sounds. Extremities: no cyanosis, clubbing, rash, edema Neuro: alert & orientedx3, cranial nerves grossly intact. moves all 4 extremities w/o difficulty. Affect pleasant  Labs    Chemistry Recent Labs  Lab 12/20/17 1501  12/21/17 0345 12/22/17 0821 12/23/17 0205  NA 138   < > 138 139 135  K 3.9   < > 3.4* 4.3 4.1  CL 108   < > 106 105 105  CO2 21*  --  _0 GLUCOSE 219*   < > 212* 205* 233*  BUN 20   < > _1 CREATININE 1.01   < > 0.99 0.99 1.12  CALCIUM 8.2*  --  8.2* 8.5* 8.1*  PROT 5.8*  --   --   --   --   ALBUMIN 3.1*  --   --   --   --   AST 25  --   --   --   --   ALT 24  --   --   --   --  ALKPHOS 65  --   --   --   --   BILITOT 0.7  --   --   --   --   GFRNONAA >60  --  >60 >60 >60  GFRAA >60  --  >60 >60 >60  ANIONGAP 9  --  _0 < > = values in this interval not displayed.     Hematology Recent Labs  Lab 12/20/17 1501 12/20/17 1502 12/21/17 0345  WBC 9.0  --  7.7  RBC 3.56*  --  3.61*  HGB 10.8* 10.5* 10.7*  HCT 31.8* 31.0* 33.0*  MCV 89.3  --  91.4  MCH 30.3  --  29.6  MCHC 34.0  --  32.4  RDW 13.0  --  13.3  PLT 247  --  228    Cardiac Enzymes Recent Labs  Lab 12/20/17 1501 12/22/17 0821 12/23/17 0205  TROPONINI 0.15* 22.20* 13.53*   No results for input(s): TROPIPOC in the last 168 hours.   BNPNo results for input(s): BNP, PROBNP in the last 168 hours.   DDimer No results for input(s): DDIMER in the last 168 hours.   Radiology    No results found.  Cardiac Studies    Echocardiogram 12/22/2017: Study Conclusions - Left ventricle: The cavity size was normal. Wall thickness was increased in a pattern of mild LVH. Apical anterior and apical inferior akinesis. Mid to apical anteroseptal akinesis. Akinesis of the apex. Systolic function was  moderately reduced. The estimated ejection fraction was in the range of 35% to 40%. Doppler parameters are consistent with abnormal left ventricular relaxation (grade 1 diastolic dysfunction). - Aortic valve: There was trivial regurgitation. - Aorta: Mildly dilated ascending aorta to 4.0 cm. - Mitral valve: There was no significant regurgitation. - Right ventricle: The cavity size was normal. Systolic function was normal. - Tricuspid valve: Peak RV-RA gradient (S): 17 mm Hg. - Pulmonary arteries: PA peak pressure: 20 mm Hg (S). - Inferior vena cava: The vessel was normal in size. The respirophasic diameter changes were in the normal range (>= 50%), consistent with normal central venous pressure.  Impressions: - Normal LV size with mild LV hypertrophy. Wall motion abnormalities as noted above. EF 35-40%. Normal RV size and systolic function. No significant valvular abnormalities.  STEMI coronary angios images 12/20/2017: Diagnostic Diagram       Post-Intervention Diagram        LVEDP was normal and EF estimated at 40% with anteroapical akinesis.  Patient Profile     72 y.o. male with a PMHx notable for CAD s/p previous stents in the PDA/LAD (prior stents included mid LAD-2013, distal circumflex, and L-PDA), HTN, HLD, IDDM, pancreatitis and HFrEF who presented with chest pain that began abruptly at rest while eating lunch. This was accompanied by generalized malaise, dyspnea and reminded him of previous ACS like chest pain. He underwent a LHC which noted LAD stent thrombosis treated with PCI and resenting 12/20/2017. Significant residual disease noted. He progressed well over the course of his stay and was encouraged to continue medical management.    Assessment & Plan    1. CAD with anterior STEMI s/p PCI 3/26: CAD with multiple prior stents with anterior ST elevation MI due to risk of restenosis and thrombus in the LAD.  Patient remains chest free since the  night following his cath has been ambulating easily with cardiac rehab phase 1.  Troponins peaked at 22.2. - Continue ASA/Brilinta from a minimum of 1 year -  Carvedilol decreased due to HT  2.HLD: -Target LDL and HDL met.  Will continue on rosuvastatin 40 mg daily.    3. Essential hypertension: - BP improved but continues with orthostatic hypotension.  - Decrease lisinopril to 10 daily - May have autonomic dysfunction from DM2. Consider midodrine as needed  4. Insulin-dependent diabetes, poorly controlled : Patient been diabetic status post severe pancreatitis.  He continues to demonstrate elevated serum glucose levels while inpatient.  Most recent hemoglobin A1c was 10. Continued SSI moderate With low CBGs will decrease Lantus to 15 units nightly 5 units per meal coverage and nightly coverage ordered Will ask DM consult team to see Will need close outpatient f/u  5. Chronic systolic HF due to icm -Echo demonstrated reduced EF of 30-40%. - Volume status ok. HF meds dropped due to low BP  6. Disposition - Multiple active issues - Will keep today - Transfer to tele  Total time spent 45 minutes. Over half that time spent discussing above.     For questions or updates, please contact [place] Please consult [email or web address] for contact info under Cardiology/STEMI.   Glori Bickers, MD  9:43 AM

## 2017-12-25 ENCOUNTER — Other Ambulatory Visit: Payer: Self-pay | Admitting: Cardiology

## 2017-12-25 LAB — GLUCOSE, CAPILLARY: GLUCOSE-CAPILLARY: 212 mg/dL — AB (ref 65–99)

## 2017-12-25 MED ORDER — INSULIN PEN NEEDLE 31G X 5 MM MISC: 0 refills | Status: DC

## 2017-12-25 MED ORDER — INSULIN GLARGINE 100 UNIT/ML SOLOSTAR PEN: 15.0000 [IU] | PEN_INJECTOR | Freq: Every day | SUBCUTANEOUS | 1 refills | Status: DC

## 2017-12-25 MED ORDER — TICAGRELOR 90 MG PO TABS: 90.0000 mg | ORAL_TABLET | Freq: Two times a day (BID) | ORAL | 0 refills | Status: DC

## 2017-12-25 MED ORDER — GLUCOSE BLOOD VI STRP: ORAL_STRIP | 0 refills | Status: DC

## 2017-12-25 MED ORDER — LISINOPRIL 20 MG PO TABS: 10.0000 mg | ORAL_TABLET | Freq: Two times a day (BID) | ORAL | 3 refills | Status: DC

## 2017-12-25 MED ORDER — TICAGRELOR 90 MG PO TABS: 90.0000 mg | ORAL_TABLET | Freq: Two times a day (BID) | ORAL | 3 refills | Status: DC

## 2017-12-25 MED ORDER — CARVEDILOL 3.125 MG PO TABS: 3.1250 mg | ORAL_TABLET | Freq: Two times a day (BID) | ORAL | 6 refills | Status: DC

## 2017-12-25 MED ORDER — LISINOPRIL 20 MG PO TABS: 10.0000 mg | ORAL_TABLET | Freq: Every day | ORAL | 3 refills | Status: DC

## 2017-12-25 MED ORDER — PANTOPRAZOLE SODIUM 40 MG PO TBEC: 40.0000 mg | DELAYED_RELEASE_TABLET | Freq: Every day | ORAL | 3 refills | Status: DC

## 2017-12-25 MED ORDER — INSULIN ASPART 100 UNIT/ML ~~LOC~~ SOLN: 5.0000 [IU] | Freq: Three times a day (TID) | SUBCUTANEOUS | 11 refills | Status: DC

## 2017-12-25 NOTE — Discharge Summary (Addendum)
Discharge Summary    Patient ID: David Lane,  MRN: [id number], DOB/AGE: Apr 17, 1946 72 y.o.  Admit date: 12/20/2017 Discharge date: 12/25/2017  Primary Care Provider: Stevie Kern A Primary Cardiologist: [name], MD  Discharge Diagnoses    Principal Problem:   Acute ST elevation myocardial infarction (STEMI) involving left anterior descending (LAD) coronary artery [facility]) Active Problems:   Hyperlipidemia   Essential hypertension   Chronic combined systolic and diastolic CHF (congestive heart failure) (HCC)   Chronic systolic CHF (congestive heart failure), NYHA class 1 (HCC)   Ischemic cardiomyopathy, EF 50% by echo 08/01/13   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 (HCC)   Status post coronary artery stent placement   Allergies Allergies  Allergen Reactions  . Lipitor [Atorvastatin] Other (See Comments)    Muscle weakness    Diagnostic Studies/Procedures    LHC 12/20/17:  Mid LAD lesion is 100% stenosed.  A drug-eluting stent was successfully placed using a STENT SYNERGY DES 2.5X32.  Post intervention, there is a 0% residual stenosis.  Ost LAD to Prox LAD lesion is 40% stenosed.  Mid LAD to Dist LAD lesion is 50% stenosed.  Ost 1st Mrg lesion is 50% stenosed.  LPDA lesion is 75% stenosed.  Previously placed Dist Cx stent (unknown type) is widely patent.  Ost LPDA to LPDA lesion is 80% stenosed.  Prox RCA to Mid RCA lesion is 95% stenosed.  There is mild left ventricular systolic dysfunction.  LV end diastolic pressure is normal.  The left ventricular ejection fraction is 45-50% by visual estimate.   1. 3 vessel obstructive CAD.     - 100% mid LAD in stent thrombosis. This is the culprit lesion    - 80% focal stenosis in stent in the PDA with 75% focal stenosis distal to the stent.    - 95% small nondominant RCA 2. Mild LV dysfunction with apical wall motion abnormality. Overall EF 45-50% 3. Normal LVEDP 4. Successful  PCI of the mid LAD with DES.   Plan: would recommend long term DAPT. Admit to ICU for post PCI care. Will discuss with interventional team concerning disease in the PDA and whether we should treat this medically or with PCI.  STEMI coronary angios images 12/20/2017: Diagnostic Diagram       Post-Intervention Diagram        LVEDP was normal and EF estimated at 40% with anteroapical akinesis.   Echo 12/21/17: Study Conclusions - Left ventricle: The cavity size was normal. Wall thickness was   increased in a pattern of mild LVH. Apical anterior and apical   inferior akinesis. Mid to apical anteroseptal akinesis. Akinesis   of the apex. Systolic function was moderately reduced. The   estimated ejection fraction was in the range of 35% to 40%.   Doppler parameters are consistent with abnormal left ventricular   relaxation (grade 1 diastolic dysfunction). - Aortic valve: There was trivial regurgitation. - Aorta: Mildly dilated ascending aorta to 4.0 cm. - Mitral valve: There was no significant regurgitation. - Right ventricle: The cavity size was normal. Systolic function   was normal. - Tricuspid valve: Peak RV-RA gradient (S): 17 mm Hg. - Pulmonary arteries: PA peak pressure: 20 mm Hg (S). - Inferior vena cava: The vessel was normal in size. The   respirophasic diameter changes were in the normal range (>= 50%),   consistent with normal central venous pressure.  Impressions: - Normal LV size with mild LV hypertrophy. Wall motion   abnormalities  as noted above. EF 35-40%. Normal RV size and   systolic function. No significant valvular abnormalities.   History of Present Illness     72 yo male with PMH of CAD s/p previous stents in the PDA/LAD, HTN, HL, IDDM, Pancreatitis and chronic systolic HF who presented with chest pain that started around 1230pm after lunch 12/20/17.  [name] is a 72 yo male with PMH of CAD s/p previous stents in the PDA/LAD, HTN, HL, IDDM,  Pancreatitis and chronic systolic HF. He has been followed by Dr. Sallyanne Kuster in the past. Had last cath in 2014, with stents noted in the RCA/LAD. Noted to have issues with controlling his blood pressure in the past and has been on several medications. Only on 81mg  ASA, no plavix. Reports he has been in his usual state of health until this afternoon. Reports he developed the sensation of someone pressing on his chest on the left side. This was around 12:30pm after he ate lunch. He became concerned and called EMS as he has hx of CAD.   On EMS arrival his EKG showed SR with anterosepal ST elevation. He was given 324mg  ASA and 1 SL nitro. This did drop his systolic blood pressure into the 80s. Given 300cc of fluids with improvement. On arrival noted minimal chest pain. Blood pressure has stabilized. He was brought directly to the cath lab.   Hospital Course     Consultants: none  STEMI He was taken emergently to the cath lab. Culprit lesion was 100% mid LAD in-stent thrombosis. He had an additional 95% RCA lesion and 80% stenosis in the PDA. Interventionalist recommends long-term DAPT, if tolerated. Plan was to treat PDA medically for now.   He tolerated the procedure. However, discharge was canceled two days in a row for hypotension and hypoglycemia. His medication regimen was adjusted. He has now walked the halls without complication and is ready for discharge.   He is discharged on ASA 81 mg, brilinta 90 mg BID, and crestor 40 mg. Coreg was reduced to 3.125 mg BID. Discharged on decreased lisinopril 10 mg. Spironolactone was D/C'ed for marginal pressures.   HTN Problems with orthostatic hypotension prior to discharge. He is feeling much better today. Doses of coreg and lisinopril reduced. May need to titrate as outpatient. Arlyce Harman and norvasc were stopped.  Chronic combined systolic and diastolic heart failure, ischemic cardiomyopathy Echo this admission with LVEF of 35-40% and grade 1 DD. He is on  BB and ACEI. He is euvolemic on exam today. No diuresis regimen at discharge. Arlyce Harman was D/C'ed for hypotension.  He was instructed to weigh daily and follow a low sodium diet.   DM A1c was 10%. Consult to diabetes coordinator for insulin adjustments. Pt was educated and will follow up with Dr. Loanne Drilling next week. Decreased lantus to 15 U at bedtime with 5 U per meal coverage. I have sent a message to DM APP to get a follow up appt with Dr. Loanne Drilling.   GERD Prilosec was changed to protonix.  _____________  Discharge Vitals Blood pressure 114/78, pulse 71, temperature 99.2 F (37.3 C), temperature source Oral, resp. rate 17, height 6' (1.829 m), weight 193 lb 6.4 oz (87.7 kg), SpO2 93 %.  Filed Weights   12/20/17 1800 12/24/17 0340 12/25/17 0500  Weight: 211 lb 10.3 oz (96 kg) 195 lb 5.2 oz (88.6 kg) 193 lb 6.4 oz (87.7 kg)    Labs & Radiologic Studies    CBC No results for input(s): WBC, NEUTROABS,  HGB, HCT, MCV, PLT in the last 72 hours. Basic Metabolic Panel Recent Labs    12/23/17 0205  NA 135  K 4.1  CL 105  CO2 22  GLUCOSE 233*  BUN 13  CREATININE 1.12  CALCIUM 8.1*   Liver Function Tests No results for input(s): AST, ALT, ALKPHOS, BILITOT, PROT, ALBUMIN in the last 72 hours. No results for input(s): LIPASE, AMYLASE in the last 72 hours. Cardiac Enzymes Recent Labs    12/23/17 0205  TROPONINI 13.53*   BNP Invalid input(s): POCBNP D-Dimer No results for input(s): DDIMER in the last 72 hours. Hemoglobin A1C No results for input(s): HGBA1C in the last 72 hours. Fasting Lipid Panel No results for input(s): CHOL, HDL, LDLCALC, TRIG, CHOLHDL, LDLDIRECT in the last 72 hours. Thyroid Function Tests No results for input(s): TSH, T4TOTAL, T3FREE, THYROIDAB in the last 72 hours.  Invalid input(s): FREET3 _____________  No results found. Disposition   Pt is being discharged home today in good condition.  Follow-up Plans & Appointments    Follow-up Information      Dorena Cookey, MD Follow up in 1 week(s).   Specialty:  Family Medicine Why:  Please call and schedule with them for one week! Contact information: [place] [address] [phone]        [name], MD Follow up on 01/06/2018.   Specialty:  Cardiology Why:  9:20 am Contact information: [address] [place] [address] [phone]          Discharge Instructions    Amb Referral to Cardiac Rehabilitation   Complete by:  As directed    Diagnosis:   Coronary Stents PTCA STEMI     Call MD for:  persistant dizziness or light-headedness   Complete by:  As directed    Call MD for:  persistant nausea and vomiting   Complete by:  As directed    Diet - low sodium heart healthy   Complete by:  As directed    Diet - low sodium heart healthy   Complete by:  As directed    Discharge instructions   Complete by:  As directed    No driving for 2 days. No lifting over 5 lbs for 1 week. No sexual activity for 1 week. You may return to work in one week. Keep procedure site clean & dry. If you notice increased pain, swelling, bleeding or pus, call/return!  You may shower, but no soaking baths/hot tubs/pools for 1 week.   Increase activity slowly   Complete by:  As directed    Increase activity slowly   Complete by:  As directed       Discharge Medications   Allergies as of 12/25/2017      Reactions   Lipitor [atorvastatin] Other (See Comments)   Muscle weakness      Medication List    STOP taking these medications   amLODipine 5 MG tablet Commonly known as:  NORVASC   insulin NPH Human 100 UNIT/ML injection Commonly known as:  NOVOLIN N   Nebivolol HCl 20 MG Tabs Commonly known as:  BYSTOLIC   omeprazole 20 MG capsule Commonly known as:  PRILOSEC Replaced by:  pantoprazole 40 MG tablet   simvastatin 40 MG tablet Commonly known as:  ZOCOR   spironolactone 50 MG tablet Commonly known as:  ALDACTONE     TAKE  these medications   aspirin 81 MG tablet Take 81 mg by mouth daily.   carvedilol  3.125 MG tablet Commonly known as:  COREG Take 1 tablet (3.125 mg total) by mouth 2 (two) times daily with a meal.   CREON 36000 UNITS Cpep capsule Generic drug:  lipase/protease/amylase TAKE 3 CAPSULES (108,000 UNITS TOTAL) BY MOUTH 3 (THREE) TIMES DAILY WITH MEALS. AND SNACKS   glucose blood test strip Commonly known as:  ACCU-CHEK AVIVA 4 (four) times daily. DX code e11.9   insulin aspart 100 UNIT/ML injection Commonly known as:  novoLOG Inject 5 Units into the skin 3 (three) times daily with meals.   Insulin Glargine 100 UNIT/ML Solostar Pen Commonly known as:  LANTUS SOLOSTAR Inject 15 Units into the skin daily at 10 pm. NEED APPOINTMENT What changed:  how much to take   Insulin Pen Needle 31G X 5 MM Misc Commonly known as:  B-D UF III MINI PEN NEEDLES use to inject insulin daily   lisinopril 20 MG tablet Commonly known as:  PRINIVIL,ZESTRIL Take 0.5 tablets (10 mg total) by mouth daily. What changed:    how much to take  when to take this   multivitamin capsule Take 1 capsule by mouth daily. HOLD UNTIL ABLE TO SWALLOW PILLS WHOLE   pantoprazole 40 MG tablet Commonly known as:  PROTONIX Take 1 tablet (40 mg total) by mouth daily. Replaces:  omeprazole 20 MG capsule   rosuvastatin 40 MG tablet Commonly known as:  CRESTOR Take 1 tablet (40 mg total) by mouth at bedtime.   ticagrelor 90 MG Tabs tablet Commonly known as:  BRILINTA Take 1 tablet (90 mg total) by mouth 2 (two) times daily.        Aspirin prescribed at discharge?  Yes High Intensity Statin Prescribed? (Lipitor 40-80mg  or Crestor 20-40mg ): Yes Beta Blocker Prescribed? Yes For EF <40%, was ACEI/ARB Prescribed? Yes ADP Receptor Inhibitor Prescribed? (i.e. Plavix etc.-Includes Medically Managed Patients): Yes For EF <40%, Aldosterone Inhibitor Prescribed? No: hypotension Was EF assessed during THIS  hospitalization? Yes Was Cardiac Rehab II ordered? (Included Medically managed Patients): Yes   Outstanding Labs/Studies   TCM appt  Appt with endocrine  Duration of Discharge Encounter   Greater than 30 minutes including physician time.  Signed, Tami Lin Duke NP 12/25/2017, 10:09 AM  Patient seen and examined with the above-signed Advanced Practice Provider and/or Housestaff. I personally reviewed laboratory data, imaging studies and relevant notes. I independently examined the patient and formulated the important aspects of the plan. I have edited the note to reflect any of my changes or salient points. I have personally discussed the plan with the patient and/or family.  Multiple med changed made prior to d/c due to severe hypoglycemia and orthostatic hypotension. Patient instructed to watch CGBs closely and to use sliding-scale insulin. Will f/u with Dr. Loanne Drilling and also with PCP and [facility] post d/c.   Glori Bickers, MD  4:06 PM

## 2017-12-25 NOTE — Progress Notes (Signed)
Progress Note  Patient Name: David Lane Date of Encounter: 12/25/2017  Primary Cardiologist: [name], MD    Subjective   Discharged postponed the past 2 days due to low BP and labile CBGs.   Diabetic regiment and BP meds adjusted yesterday. CBGs in 200-400 range all night. BPs stable 120-130   Inpatient Medications    Scheduled Meds: . aspirin  81 mg Oral Daily  . carvedilol  3.125 mg Oral BID WC  . enoxaparin (LOVENOX) injection  40 mg Subcutaneous Q24H  . insulin aspart  0-15 Units Subcutaneous TID WC  . insulin glargine  15 Units Subcutaneous QHS  . lisinopril  10 mg Oral Daily  . multivitamin with minerals  1 tablet Oral Daily  . pantoprazole  40 mg Oral Daily  . rosuvastatin  40 mg Oral q1800  . sodium chloride flush  3 mL Intravenous Q12H  . spironolactone  12.5 mg Oral Daily  . ticagrelor  90 mg Oral BID   Continuous Infusions: . sodium chloride Stopped (12/20/17 1800)   PRN Meds: sodium chloride, acetaminophen, loperamide, ondansetron (ZOFRAN) IV, oxyCODONE, sodium chloride flush   Vital Signs    Vitals:   12/25/17 0300 12/25/17 0400 12/25/17 0500 12/25/17 0803  BP:  117/69    Pulse: 74 71    Resp: 15 17    Temp: 98.7 F (37.1 C)   99.2 F (37.3 C)  TempSrc: Oral   Oral  SpO2: 94% 93%    Weight:   87.7 kg (193 lb 6.4 oz)   Height:        Intake/Output Summary (Last 24 hours) at 12/25/2017 0812 Last data filed at 12/25/2017 0419 Gross per 24 hour  Intake -  Output 1100 ml  Net -1100 ml   Filed Weights   12/20/17 1800 12/24/17 0340 12/25/17 0500  Weight: 96 kg (211 lb 10.3 oz) 88.6 kg (195 lb 5.2 oz) 87.7 kg (193 lb 6.4 oz)    Telemetry    NSR 70-80s - Personally Reviewed  ECG    No recent tracings - Personally Reviewed  Physical Exam   General:  Well appearing. No resp difficulty HEENT: normal Neck: supple. no JVD. Carotids 2+ bilat; no bruits. No lymphadenopathy or thryomegaly appreciated. Cor: PMI nondisplaced. Regular  rate & rhythm. No rubs, gallops or murmurs. Lungs: clear Abdomen: soft, nontender, nondistended. No hepatosplenomegaly. No bruits or masses. Good bowel sounds. Extremities: no cyanosis, clubbing, rash, edema Neuro: alert & orientedx3, cranial nerves grossly intact. moves all 4 extremities w/o difficulty. Affect pleasant   Labs    Chemistry Recent Labs  Lab 12/20/17 1501  12/21/17 0345 12/22/17 0821 12/23/17 0205  NA 138   < > 138 139 135  K 3.9   < > 3.4* 4.3 4.1  CL 108   < > 106 105 105  CO2 21*  --  25 27 22   GLUCOSE 219*   < > 212* 205* 233*  BUN 20   < > 15 13 13   CREATININE 1.01   < > 0.99 0.99 1.12  CALCIUM 8.2*  --  8.2* 8.5* 8.1*  PROT 5.8*  --   --   --   --   ALBUMIN 3.1*  --   --   --   --   AST 25  --   --   --   --   ALT 24  --   --   --   --   ALKPHOS 65  --   --   --   --  BILITOT 0.7  --   --   --   --   GFRNONAA >60  --  >60 >60 >60  GFRAA >60  --  >60 >60 >60  ANIONGAP 9  --  7 7 8    < > = values in this interval not displayed.     Hematology Recent Labs  Lab 12/20/17 1501 12/20/17 1502 12/21/17 0345  WBC 9.0  --  7.7  RBC 3.56*  --  3.61*  HGB 10.8* 10.5* 10.7*  HCT 31.8* 31.0* 33.0*  MCV 89.3  --  91.4  MCH 30.3  --  29.6  MCHC 34.0  --  32.4  RDW 13.0  --  13.3  PLT 247  --  228    Cardiac Enzymes Recent Labs  Lab 12/20/17 1501 12/22/17 0821 12/23/17 0205  TROPONINI 0.15* 22.20* 13.53*   No results for input(s): TROPIPOC in the last 168 hours.   BNPNo results for input(s): BNP, PROBNP in the last 168 hours.   DDimer No results for input(s): DDIMER in the last 168 hours.   Radiology    No results found.  Cardiac Studies    Echocardiogram 12/22/2017: Study Conclusions - Left ventricle: The cavity size was normal. Wall thickness was increased in a pattern of mild LVH. Apical anterior and apical inferior akinesis. Mid to apical anteroseptal akinesis. Akinesis of the apex. Systolic function was moderately reduced.  The estimated ejection fraction was in the range of 35% to 40%. Doppler parameters are consistent with abnormal left ventricular relaxation (grade 1 diastolic dysfunction). - Aortic valve: There was trivial regurgitation. - Aorta: Mildly dilated ascending aorta to 4.0 cm. - Mitral valve: There was no significant regurgitation. - Right ventricle: The cavity size was normal. Systolic function was normal. - Tricuspid valve: Peak RV-RA gradient (S): 17 mm Hg. - Pulmonary arteries: PA peak pressure: 20 mm Hg (S). - Inferior vena cava: The vessel was normal in size. The respirophasic diameter changes were in the normal range (>= 50%), consistent with normal central venous pressure.  Impressions: - Normal LV size with mild LV hypertrophy. Wall motion abnormalities as noted above. EF 35-40%. Normal RV size and systolic function. No significant valvular abnormalities.  STEMI coronary angios images 12/20/2017: Diagnostic Diagram       Post-Intervention Diagram        LVEDP was normal and EF estimated at 40% with anteroapical akinesis.  Patient Profile     72 y.o. male with a PMHx notable for CAD s/p previous stents in the PDA/LAD (prior stents included mid LAD-2013, distal circumflex, and L-PDA), HTN, HLD, IDDM, pancreatitis and HFrEF who presented with chest pain that began abruptly at rest while eating lunch. This was accompanied by generalized malaise, dyspnea and reminded him of previous ACS like chest pain. He underwent a LHC which noted LAD stent thrombosis treated with PCI and resenting 12/20/2017. Significant residual disease noted. He progressed well over the course of his stay and was encouraged to continue medical management.    Assessment & Plan    1. CAD with anterior STEMI s/p PCI 3/26: CAD with multiple prior stents with anterior ST elevation MI due to risk of restenosis and thrombus in the LAD.  Patient remains chest free since the night following his  cath has been ambulating easily with cardiac rehab phase 1.  Troponins peaked at 22.2. - Continue ASA/Brilinta from a minimum of 1 year - Carvedilol decreased due to low BP   2.HLD: -Target LDL and HDL met.  Will continue on rosuvastatin 40 mg daily.    3. Essential hypertension: - BP much improved with decreased med - May have autonomic dysfunction from DM2. Consider midodrine as needed  4. Insulin-dependent diabetes, poorly controlled : Patient been diabetic status post severe pancreatitis.  He continues to demonstrate elevated serum glucose levels while inpatient.  Most recent hemoglobin A1c was 10. Continued SSI moderate With low CBGs will decrease Lantus to 15 units nightly 5 units per meal coverage and nightly coverage ordered Will need close outpatient f/u  5. Chronic systolic HF due to icm -Echo demonstrated reduced EF of 30-40%. - Volume status ok. HF meds dropped due to low BP  6. Disposition - D/c today . Will need close f/u with Endocrine and his PCP   D/C meds   Ecasa 81 Carvedilol 3.125 bid (replaces bystolic) Lantus 15 (was on 85) Lisinopril 10 (decreased dose) Crestor 40 (replaces simva) Brilinta 90 bid Protonix 40 (rplaces prilosec)  Stop: spiro, amlodipine  F/u cardiac rehab   For questions or updates, please contact [facility] Please consult [email or web address] for contact info under Cardiology/STEMI.   Glori Bickers, MD  8:12 AM

## 2017-12-25 NOTE — Progress Notes (Signed)
Reviewed new medication regimen in great detail with patient. Patient requesting prescription for insulin syringes and test strips due to his insurance coverage. Spoke with Gae Bon, NP and prescription sent to patients pharmacy along with medications. Patient transported via wheelchair to front lobby to meet sister for discharge. Belongings and discharge paper work sent with patient.

## 2017-12-26 ENCOUNTER — Telehealth: Payer: Self-pay | Admitting: Family Medicine

## 2017-12-26 ENCOUNTER — Telehealth (HOSPITAL_COMMUNITY): Payer: Self-pay

## 2017-12-26 LAB — GLUCOSE, CAPILLARY
GLUCOSE-CAPILLARY: 124 mg/dL — AB (ref 65–99)
Glucose-Capillary: 182 mg/dL — ABNORMAL HIGH (ref 65–99)

## 2017-12-26 NOTE — Telephone Encounter (Signed)
I spoke with pt and did schedule appointment.

## 2017-12-26 NOTE — Telephone Encounter (Signed)
Patients insurance is active and benefits verified through [employer] - No co-pay, no deductible, out of pocket amount of $5,500/$43.40 has been met, 20% co-insurance, and no pre-authorization is required. [employer] reference # Charm T. ° °Will contact patient to see if interested in CR. If interested, patient will need to complete follow up appt. Once completed, patient will be contacted for scheduling upon review by the RN Navigator. °

## 2017-12-26 NOTE — Telephone Encounter (Signed)
Called to see if patient is interested in CR. Patient is interested in the program. Explained scheduling process to patient and patient stated he understands. Will contact patient for scheduling after follow up appt has been completed.

## 2017-12-26 NOTE — Telephone Encounter (Unsigned)
Copied from [place] [id number]. Topic: Appointment Scheduling - Scheduling Inquiry for Clinic >> Dec 26, 2017  9:26 AM Hewitt Shorts wrote: Reason for CRM: pt is calling to make a one week follow up appt from the hospital with Dr. Sherren Mocha and nothing Is available  Best number [phone] home or cell [phone]

## 2017-12-29 ENCOUNTER — Telehealth: Payer: Self-pay | Admitting: Cardiovascular Disease

## 2017-12-29 NOTE — Telephone Encounter (Signed)
Closed Encounter  °

## 2017-12-30 ENCOUNTER — Other Ambulatory Visit: Payer: Medicare Other

## 2018-01-02 ENCOUNTER — Ambulatory Visit: Payer: Medicare Other | Admitting: Family Medicine

## 2018-01-02 ENCOUNTER — Encounter: Payer: Self-pay | Admitting: Family Medicine

## 2018-01-02 VITALS — BP 110/80 | HR 93 | Temp 99.0°F | Wt 197.8 lb

## 2018-01-02 DIAGNOSIS — E7849 Other hyperlipidemia: Secondary | ICD-10-CM | POA: Diagnosis not present

## 2018-01-02 DIAGNOSIS — K8689 Other specified diseases of pancreas: Secondary | ICD-10-CM

## 2018-01-02 DIAGNOSIS — Z955 Presence of coronary angioplasty implant and graft: Secondary | ICD-10-CM | POA: Diagnosis not present

## 2018-01-02 DIAGNOSIS — I10 Essential (primary) hypertension: Secondary | ICD-10-CM

## 2018-01-02 DIAGNOSIS — E089 Diabetes mellitus due to underlying condition without complications: Secondary | ICD-10-CM | POA: Diagnosis not present

## 2018-01-02 MED ORDER — METFORMIN HCL 500 MG PO TABS: ORAL_TABLET | ORAL | 4 refills | Status: DC

## 2018-01-02 NOTE — Progress Notes (Signed)
David Lane is a 72 year old male nonsmoker who comes in today following a hospitalization from [phone] acute coronary event  He presented the hospital with acute chest pain. 6 years ago he had 3 stents put in. This time angiogram showed a occlusive lesion in his LAD. He states he put in a long stent this time and now has a patent LAD. He did well in the hospital with no complications except for elevated blood sugar. His blood sugarwas in the 250-300 range. A1c on admission was 10. He was on Lantus nightly. There is 5 units before each meal and with controlled diet his blood sugar dropped to 67. He was sent home on 5 units of regular and some before each meal and 50 units of Lantus at bedtime however he says when he went to get the regular insulin filledit was $500. He was not is any alternative. In the past he was on metformin before he start on insulin. No side effects from the metformin. I wonder if some metformin in addition to the Lantus might help lower his blood sugar and eased pain in his wallet.  However with his severe pancreatic insufficiency on not sure of the metformin's go to work b let's try it because he can't afford their insulin  Medications reviewed.  BP 110/80 (BP Location: Left Arm, Patient Position: Sitting, Cuff Size: Large)   Pulse 93   Temp 99 F (37.2 C) (Oral)   Wt 197 lb 12.8 oz (89.7 kg)   SpO2 94%   BMI 26.83 kg/m  Well-developed well-nourished male no acute distress  Cardiopulmonary exam normal  #1 coronary artery disease status post stent March 26asymptomatic  #2 hypertension at goal......... Continue current therapy  #3 hyperlipidemia....... Continue the new statin  #4 diabetes type 1 poor control.......Marland Kitchen A1c 10........ Because of cost will stop the 5 units before each meal and try metformin,,,,,,, strict diet,,,,,, daily walking,,,,, continue nighttime insulin. Patient extremely frustrated because his blood sugar is elevated as his A1c is. Consider a second  opinion with Dr. Lorie Apley

## 2018-01-02 NOTE — Patient Instructions (Signed)
Stop the insulin before each meal  Continue your evening insulin  Metformin 1000 mg........ One tablet before breakfast and one tablet before your evening meal  Walk 30 minutes daily  Strict carbohydrate free diet  We'll get you set up with a consult with Dr. Lorie Apley for a second opinion on your diabetes

## 2018-01-04 ENCOUNTER — Encounter: Payer: Self-pay | Admitting: Family Medicine

## 2018-01-04 ENCOUNTER — Ambulatory Visit (INDEPENDENT_AMBULATORY_CARE_PROVIDER_SITE_OTHER): Payer: Medicare Other | Admitting: Family Medicine

## 2018-01-04 ENCOUNTER — Encounter: Payer: Medicare Other | Admitting: Family Medicine

## 2018-01-04 DIAGNOSIS — K8689 Other specified diseases of pancreas: Secondary | ICD-10-CM

## 2018-01-04 DIAGNOSIS — E13621 Other specified diabetes mellitus with foot ulcer: Secondary | ICD-10-CM | POA: Insufficient documentation

## 2018-01-04 DIAGNOSIS — E089 Diabetes mellitus due to underlying condition without complications: Secondary | ICD-10-CM

## 2018-01-04 DIAGNOSIS — L97509 Non-pressure chronic ulcer of other part of unspecified foot with unspecified severity: Secondary | ICD-10-CM

## 2018-01-04 NOTE — Progress Notes (Signed)
David Lane is is a 72 year old male nonsmoker who comes in today for evaluation of a foot ulcer  He was recently hospitalized with acute coronary event and had a complete physical examination. He says he's here today for complete physical examination however since he had a complete physical examination recently in the hospital it does not need to be repeated.  His main problem is his diabetes which is been out of control secondary to noncompliance. He says he has a foot ulcer on his right foot he would like checked. It's been there for a couple months. He had a problem with an ulcer on the dorsum that foot a couple years ago. He went to see a foot specialist and they gave him some medicine and it cleared up.  He wonders what he can do to prevent these foot ulcers. We again discussed tight control of his diabetes which she's not been able to do. We saw him this week for follow-up of the hospital. At that time because of his frustration with his inability to control his blood sugar we were requesting a second opinion from Dr. Lorie Apley. That is  In the works.  There were no vitals taken for this visit. Vital signs taken 82 days ago therefore not repeated.  Examination foot shows a 5 cm ulcer on the callus portion of his lower right foot.  #1 diabetes not at goal,,,,,,,,,,,, treatment program as outlined 2 days ago  #2 diabetic foot ulcer,,,,,,, referred to podiatry

## 2018-01-04 NOTE — Patient Instructions (Signed)
Soak your foot twice daily as we discussed  We'll get you set up an appointment at the wound center ASAP

## 2018-01-05 NOTE — Progress Notes (Signed)
error 

## 2018-01-06 ENCOUNTER — Ambulatory Visit: Payer: Medicare Other | Admitting: Cardiovascular Disease

## 2018-01-07 NOTE — Progress Notes (Deleted)
Cardiology Office Note   Date:  01/07/2018   ID:  [name], [name] October 17, 1945, MRN [id number]  PCP:  David Cookey, MD  Cardiologist: Dr. Haroldine Lane  No chief complaint on file.    History of Present Illness: David Lane is a 72 y.o. male who presents for posthospitalization follow-up after admission for acute ST elevation MI involving the left anterior descending artery, previous stent to the distal circumflex status post cardiac catheterization revealing 100% mid LAD stent thrombosis, 80% focal stenosis in the stent in the PDA and 75% focal stenosis distal to the stent, 95% small nondominant RCA.    The patient had stenting to the mid LAD with drug-eluting stent.  He has recommended for dual antiplatelet therapy for 1 year.  Patient's ejection fraction was calculated at 35% to 40% with mild left ventricular hypertrophy, normal RV size and systolic function.  No significant valvular abnormalities.   Other history to include hypertension, chronic combined systolic diastolic heart failure, insulin-dependent diabetes mellitus, history of pancreatitis.    During hospitalization the patient had episodes of hypotension and hypoglycemia requiring medication adjustment and delay in discharge.  Ultimately, he was discharged on aspirin 81 mg, Brilinta 90 mg twice daily, Crestor 40 mg daily, carvedilol with decreased to 3.125 mg twice daily, lisinopril was decreased to 10 mg daily, and Spironolactone was discontinued.    He was seen by diabetic coordinator who made adjustments in his insulin to decrease his Lantus to 15 units at bedtime with 5 units during meal coverage and has a follow-up with endocrinologist, David Lane post discharge.  Due to chronic foot ulcers, the patient was seen by PCP David Lane who referred him to podiatry.    Past Medical History:  Diagnosis Date  . CAD (coronary artery disease)    last cath by David Lane DR.  [name] showing  some  disease involving LCX  and small size of Diag   . CHF exacerbation, due to diastolic dysfunction 26/94/8546  . Chronic systolic CHF (congestive heart failure), NYHA class 1 ([place]) 07/17/2012  . GERD (gastroesophageal reflux disease)   . Hepatic lesion 02/04/11  . Hyperlipemia   . Hypertension   . Ischemic cardiomyopathy    EF 35-40%  . Liver hemangioma   . NSTEMI (non-ST elevated myocardial infarction) ([place]) 11/21/2009  . Pancreatitis 2000's  . Shortness of breath   . Type II diabetes mellitus ([place])     Past Surgical History:  Procedure Laterality Date  . CARDIAC CATHETERIZATION  2011   minimal disease, medical management  . CORONARY ANGIOPLASTY WITH STENT PLACEMENT  07/14/2012   successful PCI & stenting of mid LAD & PDA off the dominant CX  . CORONARY/GRAFT ACUTE MI REVASCULARIZATION N/A 12/20/2017   Procedure: Coronary/Graft Acute MI Revascularization;  Surgeon: Martinique, Peter M, MD;  Location: [facility];  Service: Cardiovascular;  Laterality: N/A;  . INCISION AND DRAINAGE ABSCESS ANAL  1970's  . LEFT HEART CATH AND CORONARY ANGIOGRAPHY N/A 12/20/2017   Procedure: LEFT HEART CATH AND CORONARY ANGIOGRAPHY;  Surgeon: Martinique, Peter M, MD;  Location: [facility];  Service: Cardiovascular;  Laterality: N/A;  . LEFT HEART CATHETERIZATION WITH CORONARY ANGIOGRAM N/A 07/14/2012   Procedure: LEFT HEART CATHETERIZATION WITH CORONARY ANGIOGRAM;  Surgeon: [name], MD;  Location: [facility];  Service: Cardiovascular;  Laterality: N/A;  . PERCUTANEOUS CORONARY STENT INTERVENTION (PCI-S) Right 07/14/2012   Procedure: PERCUTANEOUS CORONARY STENT INTERVENTION (PCI-S);  Surgeon: [name], MD;  Location: [facility]  CATH LAB;  Service: Cardiovascular;  Laterality: Right;     Current Outpatient Medications  Medication Sig Dispense Refill  . aspirin 81 MG tablet Take 81 mg by mouth daily.      . carvedilol (COREG) 3.125 MG tablet Take 1 tablet (3.125 mg total) by mouth 2 (two) times daily with a meal. 60  tablet 6  . glucose blood (ACCU-CHEK AVIVA) test strip 4 (four) times daily. DX code e11.9 100 each 0  . insulin aspart (NOVOLOG) 100 UNIT/ML injection Inject 5 Units into the skin 3 (three) times daily with meals. 10 mL 11  . Insulin Glargine (LANTUS SOLOSTAR) 100 UNIT/ML Solostar Pen Inject 15 Units into the skin daily at 10 pm. NEED APPOINTMENT 9 pen 1  . Insulin Pen Needle (B-D UF III MINI PEN NEEDLES) 31G X 5 MM MISC use to inject insulin daily 100 each 0  . lisinopril (PRINIVIL,ZESTRIL) 20 MG tablet Take 0.5 tablets (10 mg total) by mouth daily. 180 tablet 3  . metFORMIN (GLUCOPHAGE) 500 MG tablet 1 tab before breakfast, 1 tab before your evening meal 200 tablet 4  . Multiple Vitamin (MULTIVITAMIN) capsule Take 1 capsule by mouth daily. HOLD UNTIL ABLE TO SWALLOW PILLS WHOLE    . pantoprazole (PROTONIX) 40 MG tablet Take 1 tablet (40 mg total) by mouth daily. 90 tablet 3  . rosuvastatin (CRESTOR) 40 MG tablet Take 1 tablet (40 mg total) by mouth at bedtime. 30 tablet 1  . ticagrelor (BRILINTA) 90 MG TABS tablet Take 1 tablet (90 mg total) by mouth 2 (two) times daily. 180 tablet 3   No current facility-administered medications for this visit.     Allergies:   Lipitor [atorvastatin]    Social History:  The patient  reports that he has never smoked. He has never used smokeless tobacco. He reports that he does not drink alcohol or use drugs.   Family History:  The patient's family history includes Diabetes in his maternal grandfather, mother, and paternal grandmother.    ROS: All other systems are reviewed and negative. Unless otherwise mentioned in H&P    PHYSICAL EXAM: VS:  There were no vitals taken for this visit. , BMI There is no height or weight on file to calculate BMI. GEN: Well nourished, well developed, in no acute distress  HEENT: normal  Neck: no JVD, carotid bruits, or masses Cardiac: ***RRR; no murmurs, rubs, or gallops,no edema  Respiratory:  clear to auscultation  bilaterally, normal work of breathing GI: soft, nontender, nondistended, + BS MS: no deformity or atrophy  Skin: warm and dry, no rash Neuro:  Strength and sensation are intact Psych: euthymic mood, full affect   EKG:  EKG {ACTION; IS/IS [id number]} ordered today. The ekg ordered today demonstrates ***   Recent Labs: 12/20/2017: ALT 24 12/21/2017: Hemoglobin 10.7; Platelets 228 12/23/2017: BUN 13; Creatinine, Ser 1.12; Potassium 4.1; Sodium 135    Lipid Panel    Component Value Date/Time   CHOL 102 12/21/2017 0345   TRIG 70 12/21/2017 0345   HDL 24 (L) 12/21/2017 0345   CHOLHDL 4.3 12/21/2017 0345   VLDL 14 12/21/2017 0345   LDLCALC 64 12/21/2017 0345      Wt Readings from Last 3 Encounters:  01/04/18 196 lb (88.9 kg)  01/02/18 197 lb 12.8 oz (89.7 kg)  12/25/17 193 lb 6.4 oz (87.7 kg)      Other studies Reviewed: Additional studies/ records that were reviewed today include: ***. Review of the above records demonstrates: ***  ASSESSMENT AND PLAN:  1.  ***   Current medicines are reviewed at length with the patient today.    Labs/ tests ordered today include: *** Phill Myron. West Pugh, ANP, AACC   01/07/2018 11:42 AM    [facility] [address], [place], [place] [address] Phone: [phone]; Fax: [phone]

## 2018-01-09 ENCOUNTER — Telehealth: Payer: Self-pay | Admitting: Adult Health

## 2018-01-09 ENCOUNTER — Ambulatory Visit: Payer: Medicare Other | Admitting: Adult Health

## 2018-01-09 DIAGNOSIS — E119 Type 2 diabetes mellitus without complications: Secondary | ICD-10-CM | POA: Diagnosis not present

## 2018-01-09 NOTE — Telephone Encounter (Signed)
Returned call to pt he states that he was too lightheaded and dizzy to come to appt, appt rescheduled He states that his blood sugar is still too high, informed pt to call PCP and discuss.

## 2018-01-09 NOTE — Telephone Encounter (Signed)
Tried to call pt, no VM

## 2018-01-09 NOTE — Telephone Encounter (Signed)
Mew Message:    Pt was so sick could not come for his appt this morning,please call.

## 2018-01-12 ENCOUNTER — Emergency Department (HOSPITAL_COMMUNITY): Payer: Medicare Other

## 2018-01-12 ENCOUNTER — Inpatient Hospital Stay (HOSPITAL_COMMUNITY)
Admission: EM | Admit: 2018-01-12 | Discharge: 2018-01-20 | DRG: 853 | Disposition: A | Discharge status: Rehabilitation Facility | Payer: Medicare Other | Attending: Internal Medicine | Admitting: Internal Medicine

## 2018-01-12 ENCOUNTER — Encounter (HOSPITAL_COMMUNITY): Payer: Self-pay

## 2018-01-12 ENCOUNTER — Inpatient Hospital Stay (HOSPITAL_COMMUNITY): Payer: Medicare Other

## 2018-01-12 DIAGNOSIS — E1051 Type 1 diabetes mellitus with diabetic peripheral angiopathy without gangrene: Secondary | ICD-10-CM | POA: Diagnosis not present

## 2018-01-12 DIAGNOSIS — I2102 ST elevation (STEMI) myocardial infarction involving left anterior descending coronary artery: Secondary | ICD-10-CM | POA: Diagnosis not present

## 2018-01-12 DIAGNOSIS — D649 Anemia, unspecified: Secondary | ICD-10-CM | POA: Diagnosis not present

## 2018-01-12 DIAGNOSIS — W0110XA Fall on same level from slipping, tripping and stumbling with subsequent striking against unspecified object, initial encounter: Secondary | ICD-10-CM | POA: Diagnosis present

## 2018-01-12 DIAGNOSIS — I5042 Chronic combined systolic (congestive) and diastolic (congestive) heart failure: Secondary | ICD-10-CM | POA: Diagnosis present

## 2018-01-12 DIAGNOSIS — R404 Transient alteration of awareness: Secondary | ICD-10-CM | POA: Diagnosis not present

## 2018-01-12 DIAGNOSIS — Z955 Presence of coronary angioplasty implant and graft: Secondary | ICD-10-CM

## 2018-01-12 DIAGNOSIS — K219 Gastro-esophageal reflux disease without esophagitis: Secondary | ICD-10-CM | POA: Diagnosis present

## 2018-01-12 DIAGNOSIS — L02612 Cutaneous abscess of left foot: Secondary | ICD-10-CM

## 2018-01-12 DIAGNOSIS — R42 Dizziness and giddiness: Secondary | ICD-10-CM | POA: Diagnosis present

## 2018-01-12 DIAGNOSIS — E1140 Type 2 diabetes mellitus with diabetic neuropathy, unspecified: Secondary | ICD-10-CM | POA: Diagnosis present

## 2018-01-12 DIAGNOSIS — I10 Essential (primary) hypertension: Secondary | ICD-10-CM | POA: Diagnosis present

## 2018-01-12 DIAGNOSIS — E11649 Type 2 diabetes mellitus with hypoglycemia without coma: Secondary | ICD-10-CM | POA: Diagnosis present

## 2018-01-12 DIAGNOSIS — Z7982 Long term (current) use of aspirin: Secondary | ICD-10-CM

## 2018-01-12 DIAGNOSIS — L03116 Cellulitis of left lower limb: Secondary | ICD-10-CM | POA: Diagnosis present

## 2018-01-12 DIAGNOSIS — E11621 Type 2 diabetes mellitus with foot ulcer: Secondary | ICD-10-CM | POA: Diagnosis present

## 2018-01-12 DIAGNOSIS — L97521 Non-pressure chronic ulcer of other part of left foot limited to breakdown of skin: Secondary | ICD-10-CM | POA: Diagnosis not present

## 2018-01-12 DIAGNOSIS — D899 Disorder involving the immune mechanism, unspecified: Secondary | ICD-10-CM | POA: Diagnosis present

## 2018-01-12 DIAGNOSIS — I252 Old myocardial infarction: Secondary | ICD-10-CM | POA: Diagnosis not present

## 2018-01-12 DIAGNOSIS — Z794 Long term (current) use of insulin: Secondary | ICD-10-CM | POA: Diagnosis not present

## 2018-01-12 DIAGNOSIS — M86272 Subacute osteomyelitis, left ankle and foot: Secondary | ICD-10-CM | POA: Diagnosis present

## 2018-01-12 DIAGNOSIS — Z4781 Encounter for orthopedic aftercare following surgical amputation: Secondary | ICD-10-CM | POA: Diagnosis not present

## 2018-01-12 DIAGNOSIS — E089 Diabetes mellitus due to underlying condition without complications: Secondary | ICD-10-CM | POA: Diagnosis present

## 2018-01-12 DIAGNOSIS — N179 Acute kidney failure, unspecified: Secondary | ICD-10-CM | POA: Diagnosis present

## 2018-01-12 DIAGNOSIS — A419 Sepsis, unspecified organism: Secondary | ICD-10-CM | POA: Diagnosis not present

## 2018-01-12 DIAGNOSIS — Z79899 Other long term (current) drug therapy: Secondary | ICD-10-CM

## 2018-01-12 DIAGNOSIS — I2510 Atherosclerotic heart disease of native coronary artery without angina pectoris: Secondary | ICD-10-CM | POA: Diagnosis not present

## 2018-01-12 DIAGNOSIS — R17 Unspecified jaundice: Secondary | ICD-10-CM | POA: Diagnosis not present

## 2018-01-12 DIAGNOSIS — R6520 Severe sepsis without septic shock: Secondary | ICD-10-CM | POA: Diagnosis not present

## 2018-01-12 DIAGNOSIS — I255 Ischemic cardiomyopathy: Secondary | ICD-10-CM | POA: Diagnosis present

## 2018-01-12 DIAGNOSIS — T368X5A Adverse effect of other systemic antibiotics, initial encounter: Secondary | ICD-10-CM | POA: Diagnosis present

## 2018-01-12 DIAGNOSIS — D62 Acute posthemorrhagic anemia: Secondary | ICD-10-CM | POA: Diagnosis present

## 2018-01-12 DIAGNOSIS — Z7902 Long term (current) use of antithrombotics/antiplatelets: Secondary | ICD-10-CM

## 2018-01-12 DIAGNOSIS — E876 Hypokalemia: Secondary | ICD-10-CM | POA: Diagnosis not present

## 2018-01-12 DIAGNOSIS — I110 Hypertensive heart disease with heart failure: Secondary | ICD-10-CM | POA: Diagnosis present

## 2018-01-12 DIAGNOSIS — L0390 Cellulitis, unspecified: Secondary | ICD-10-CM | POA: Diagnosis not present

## 2018-01-12 DIAGNOSIS — M009 Pyogenic arthritis, unspecified: Secondary | ICD-10-CM | POA: Diagnosis present

## 2018-01-12 DIAGNOSIS — R5381 Other malaise: Secondary | ICD-10-CM | POA: Diagnosis not present

## 2018-01-12 DIAGNOSIS — K8689 Other specified diseases of pancreas: Secondary | ICD-10-CM

## 2018-01-12 DIAGNOSIS — M00872 Arthritis due to other bacteria, left ankle and foot: Secondary | ICD-10-CM | POA: Diagnosis not present

## 2018-01-12 DIAGNOSIS — E162 Hypoglycemia, unspecified: Secondary | ICD-10-CM | POA: Diagnosis not present

## 2018-01-12 DIAGNOSIS — E785 Hyperlipidemia, unspecified: Secondary | ICD-10-CM | POA: Diagnosis not present

## 2018-01-12 DIAGNOSIS — R269 Unspecified abnormalities of gait and mobility: Secondary | ICD-10-CM | POA: Diagnosis not present

## 2018-01-12 DIAGNOSIS — R7309 Other abnormal glucose: Secondary | ICD-10-CM | POA: Diagnosis not present

## 2018-01-12 DIAGNOSIS — E1042 Type 1 diabetes mellitus with diabetic polyneuropathy: Secondary | ICD-10-CM | POA: Diagnosis not present

## 2018-01-12 DIAGNOSIS — E1169 Type 2 diabetes mellitus with other specified complication: Secondary | ICD-10-CM | POA: Diagnosis not present

## 2018-01-12 DIAGNOSIS — Z89432 Acquired absence of left foot: Secondary | ICD-10-CM | POA: Diagnosis not present

## 2018-01-12 DIAGNOSIS — M868X7 Other osteomyelitis, ankle and foot: Secondary | ICD-10-CM | POA: Diagnosis not present

## 2018-01-12 DIAGNOSIS — I959 Hypotension, unspecified: Secondary | ICD-10-CM | POA: Diagnosis present

## 2018-01-12 DIAGNOSIS — Z888 Allergy status to other drugs, medicaments and biological substances status: Secondary | ICD-10-CM

## 2018-01-12 DIAGNOSIS — E10649 Type 1 diabetes mellitus with hypoglycemia without coma: Secondary | ICD-10-CM | POA: Diagnosis not present

## 2018-01-12 LAB — COMPREHENSIVE METABOLIC PANEL
ALT: 26 U/L (ref 17–63)
AST: 38 U/L (ref 15–41)
Albumin: 2.3 g/dL — ABNORMAL LOW (ref 3.5–5.0)
Alkaline Phosphatase: 71 U/L (ref 38–126)
Anion gap: 11 (ref 5–15)
BUN: 20 mg/dL (ref 6–20)
CHLORIDE: 98 mmol/L — AB (ref 101–111)
CO2: 22 mmol/L (ref 22–32)
Calcium: 8 mg/dL — ABNORMAL LOW (ref 8.9–10.3)
Creatinine, Ser: 1.38 mg/dL — ABNORMAL HIGH (ref 0.61–1.24)
GFR, EST AFRICAN AMERICAN: 59 mL/min — AB (ref >60)
GFR, EST NON AFRICAN AMERICAN: 51 mL/min — AB (ref >60)
Glucose, Bld: 64 mg/dL — ABNORMAL LOW (ref 65–99)
POTASSIUM: 3.4 mmol/L — AB (ref 3.5–5.1)
Sodium: 131 mmol/L — ABNORMAL LOW (ref 135–145)
Total Bilirubin: 1.6 mg/dL — ABNORMAL HIGH (ref 0.3–1.2)
Total Protein: 6.4 g/dL — ABNORMAL LOW (ref 6.5–8.1)

## 2018-01-12 LAB — CBC WITH DIFFERENTIAL/PLATELET
Basophils Absolute: 0 10*3/uL (ref 0.0–0.1)
Basophils Relative: 0 %
Eosinophils Absolute: 0 10*3/uL (ref 0.0–0.7)
Eosinophils Relative: 0 %
HEMATOCRIT: 31.2 % — AB (ref 39.0–52.0)
HEMOGLOBIN: 10.6 g/dL — AB (ref 13.0–17.0)
LYMPHS ABS: 1.7 10*3/uL (ref 0.7–4.0)
LYMPHS PCT: 9 %
MCH: 29.9 pg (ref 26.0–34.0)
MCHC: 34 g/dL (ref 30.0–36.0)
MCV: 88.1 fL (ref 78.0–100.0)
Monocytes Absolute: 2.2 10*3/uL — ABNORMAL HIGH (ref 0.1–1.0)
Monocytes Relative: 12 %
Neutro Abs: 14.3 10*3/uL — ABNORMAL HIGH (ref 1.7–7.7)
Neutrophils Relative %: 79 %
Platelets: 316 10*3/uL (ref 150–400)
RBC: 3.54 MIL/uL — AB (ref 4.22–5.81)
RDW: 12.5 % (ref 11.5–15.5)
WBC: 18.2 10*3/uL — AB (ref 4.0–10.5)

## 2018-01-12 LAB — CBG MONITORING, ED
GLUCOSE-CAPILLARY: 140 mg/dL — AB (ref 65–99)
Glucose-Capillary: 128 mg/dL — ABNORMAL HIGH (ref 65–99)
Glucose-Capillary: 44 mg/dL — CL (ref 65–99)

## 2018-01-12 LAB — I-STAT CG4 LACTIC ACID, ED: LACTIC ACID, VENOUS: 1.17 mmol/L (ref 0.5–1.9)

## 2018-01-12 LAB — PROCALCITONIN: Procalcitonin: 0.64 ng/mL

## 2018-01-12 MED ORDER — DEXTROSE 50 % IV SOLN
25.0000 mL | Freq: Once | INTRAVENOUS | Status: AC
  Administered 2018-01-12: 25 mL via INTRAVENOUS

## 2018-01-12 MED ORDER — ACETAMINOPHEN 500 MG PO TABS
1000.0000 mg | ORAL_TABLET | Freq: Once | ORAL | Status: AC
  Administered 2018-01-12: 1000 mg via ORAL
  Filled 2018-01-12: qty 2

## 2018-01-12 MED ORDER — LACTATED RINGERS IV BOLUS (SEPSIS)
1000.0000 mL | Freq: Once | INTRAVENOUS | Status: AC
  Administered 2018-01-13: 1000 mL via INTRAVENOUS

## 2018-01-12 MED ORDER — VANCOMYCIN HCL IN DEXTROSE 1-5 GM/200ML-% IV SOLN
1000.0000 mg | Freq: Once | INTRAVENOUS | Status: DC
  Filled 2018-01-12: qty 200

## 2018-01-12 MED ORDER — POTASSIUM CHLORIDE CRYS ER 20 MEQ PO TBCR
40.0000 meq | EXTENDED_RELEASE_TABLET | Freq: Once | ORAL | Status: AC
  Administered 2018-01-12: 40 meq via ORAL
  Filled 2018-01-12: qty 2

## 2018-01-12 MED ORDER — LACTATED RINGERS IV BOLUS (SEPSIS)
1000.0000 mL | Freq: Once | INTRAVENOUS | Status: AC
  Administered 2018-01-12: 1000 mL via INTRAVENOUS

## 2018-01-12 MED ORDER — PIPERACILLIN-TAZOBACTAM 3.375 G IVPB 30 MIN
3.3750 g | Freq: Once | INTRAVENOUS | Status: AC
  Administered 2018-01-12: 3.375 g via INTRAVENOUS
  Filled 2018-01-12: qty 50

## 2018-01-12 MED ORDER — PIPERACILLIN-TAZOBACTAM 3.375 G IVPB
3.3750 g | Freq: Three times a day (TID) | INTRAVENOUS | Status: DC
  Administered 2018-01-13 – 2018-01-20 (×24): 3.375 g via INTRAVENOUS
  Filled 2018-01-12 (×24): qty 50

## 2018-01-12 MED ORDER — VANCOMYCIN HCL 10 G IV SOLR
2000.0000 mg | Freq: Once | INTRAVENOUS | Status: AC
  Administered 2018-01-12: 2000 mg via INTRAVENOUS
  Filled 2018-01-12: qty 2000

## 2018-01-12 MED ORDER — VANCOMYCIN HCL 10 G IV SOLR
1500.0000 mg | INTRAVENOUS | Status: DC
  Administered 2018-01-13: 1500 mg via INTRAVENOUS
  Filled 2018-01-12 (×2): qty 1500

## 2018-01-12 MED ORDER — DEXTROSE 50 % IV SOLN
12.5000 g | Freq: Once | INTRAVENOUS | Status: DC
  Filled 2018-01-12: qty 50

## 2018-01-12 NOTE — Progress Notes (Signed)
Pharmacy Antibiotic Note  David Lane is a 72 y.o. male admitted on 01/12/2018 with cellulitis of his left foot.  Pharmacy has been consulted for vancomycin and Zosyn dosing. Patient received one time doses of vanc and Zosyn in ED. Scan shows no evidence of osteo. CrCl~55.  Plan: Vancomycin 1500 mg IV every 24 hours.  Goal trough 10-15 mcg/mL. Zosyn 3.375g IV q8h (4 hour infusion). F/U cultures, LOT, renal function, and vanc trough as needed.    Temp (24hrs), Avg:101.2 F (38.4 C), Min:99.3 F (37.4 C), Max:103.1 F (39.5 C)  Recent Labs  Lab 01/12/18 1550 01/12/18 1559  WBC 18.2*  --   CREATININE 1.38*  --   LATICACIDVEN  --  1.17    Estimated Creatinine Clearance: 55.5 mL/min (A) (by C-G formula based on SCr of 1.38 mg/dL (H)).    Allergies  Allergen Reactions  . Lipitor [Atorvastatin] Other (See Comments)    Muscle weakness  . Metformin And Related Diarrhea    Severe diarrhea    Antimicrobials this admission: Vancomycin 4/18 >> Zosyn 4/18 >>  Dose adjustments this admission: none  Microbiology results: 4/18 BCx: pending  Thank you for allowing pharmacy to be a part of this patient's care.  [name] Sherrie Marsan 01/12/2018 6:07 PM

## 2018-01-12 NOTE — Progress Notes (Signed)
Pharmacy Antibiotic Note  David Lane is a 72 y.o. male admitted on 01/12/2018 with cellulitis of his left foot.  Pharmacy has been consulted for vancomycin and Zosyn dosing.   Plan: Vancomycin 1500 mg IV every 24 hours.  Goal trough 10-15 mcg/mL. Zosyn 3.375g IV q8h (4 hour infusion). F/U cultures, LOT, renal function, and vanc trough as needed.    Temp (24hrs), Avg:101.2 F (38.4 C), Min:99.3 F (37.4 C), Max:103.1 F (39.5 C)  Recent Labs  Lab 01/12/18 1559  LATICACIDVEN 1.17    Estimated Creatinine Clearance: 68.3 mL/min (by C-G formula based on SCr of 1.12 mg/dL).    Allergies  Allergen Reactions  . Lipitor [Atorvastatin] Other (See Comments)    Muscle weakness    Antimicrobials this admission: Vancomycin 4/18 >> Zosyn 4/18 >>  Dose adjustments this admission: none  Microbiology results: 4/18 BCx: pending  Thank you for allowing pharmacy to be a part of this patient's care.  [name] Guillermina Shaft 01/12/2018 4:03 PM

## 2018-01-12 NOTE — ED Triage Notes (Signed)
Pt presents to ED via EMS from home with complains left foot wound. PT states he soaked the foot as directed and applied ointment last night and woke up this morning with inflamed foot. Pt also has multiple complaints regarding dizziness and blood sugar since last discharge.    cbg 72 117/64 Hr 100 rr 20 spo2 99% RA  #20 lac

## 2018-01-12 NOTE — ED Notes (Signed)
Patient transported to X-ray 

## 2018-01-12 NOTE — ED Provider Notes (Signed)
[facility] EMERGENCY DEPARTMENT Provider Note   CSN: [id number] Arrival date & time: 01/12/18  1526     History   Chief Complaint Chief Complaint  Patient presents with  . Wound Check    HPI David Lane is a 72 y.o. male.  HPI Patient reports he had a wound at plantar surface of left foot for about a month.  Since yesterday his foot has become more reddened, minimally painful.  He denies any nausea vomiting he is felt "dizzy " feeling lightheaded for the past 3 or 4 days.  No fever.  No chest pain.  He did trip and fall yesterday striking his face.  No loss of consciousness no headache no neck pain no other associated symptoms.  Nothing makes symptoms better or worse.  EMS treated patient with saline 150 mL IV prior to coming here denies any chest pain denies shortness of breath. Past Medical History:  Diagnosis Date  . CAD (coronary artery disease)    last cath by [facility] DR.  Mihai Croitoru showing  some  disease involving LCX and small size of Diag   . CHF exacerbation, due to diastolic dysfunction 78/93/8101  . Chronic systolic CHF (congestive heart failure), NYHA class 1 ([place]) 07/17/2012  . GERD (gastroesophageal reflux disease)   . Hepatic lesion 02/04/11  . Hyperlipemia   . Hypertension   . Ischemic cardiomyopathy    EF 35-40%  . Liver hemangioma   . NSTEMI (non-ST elevated myocardial infarction) ([place]) 11/21/2009  . Pancreatitis 2000's  . Shortness of breath   . Type II diabetes mellitus [facility])     Patient Active Problem List   Diagnosis Date Noted  . Foot ulcer due to secondary DM ([place]) 01/04/2018  . Status post coronary artery stent placement   . Acute ST elevation myocardial infarction (STEMI) involving left anterior descending (LAD) coronary artery ([place]) 12/20/2017  . 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 Wellness examination 11/25/2014  . Screening for prostate cancer 11/25/2014  . Pancreatic  insufficiency 01/25/2013  . Ischemic cardiomyopathy, EF 50% by echo 08/01/13 07/19/2012  . Chronic systolic CHF (congestive heart failure), NYHA class 1 ([place]) 07/17/2012  . Chronic combined systolic and diastolic CHF (congestive heart failure) ([place]) 07/14/2012  . Pulmonary edema, most likely due to diastolic dysfunction 75/06/2584  . CAD, 07/14/12- LAD/PDA DES   . ANXIETY 12/12/2009  . Hyperlipidemia 04/24/2007  . Essential hypertension 04/24/2007  . PANCREATITIS, HX OF 04/24/2007    Past Surgical History:  Procedure Laterality Date  . CARDIAC CATHETERIZATION  2011   minimal disease, medical management  . CORONARY ANGIOPLASTY WITH STENT PLACEMENT  07/14/2012   successful PCI & stenting of mid LAD & PDA off the dominant CX  . CORONARY/GRAFT ACUTE MI REVASCULARIZATION N/A 12/20/2017   Procedure: Coronary/Graft Acute MI Revascularization;  Surgeon: Martinique, Peter M, MD;  Location: [facility] CV LAB;  Service: Cardiovascular;  Laterality: N/A;  . INCISION AND DRAINAGE ABSCESS ANAL  1970's  . LEFT HEART CATH AND CORONARY ANGIOGRAPHY N/A 12/20/2017   Procedure: LEFT HEART CATH AND CORONARY ANGIOGRAPHY;  Surgeon: Martinique, Peter M, MD;  Location: [place] [facility] CV LAB;  Service: Cardiovascular;  Laterality: N/A;  . LEFT HEART CATHETERIZATION WITH CORONARY ANGIOGRAM N/A 07/14/2012   Procedure: LEFT HEART CATHETERIZATION WITH CORONARY ANGIOGRAM;  Surgeon: [name], MD;  Location: [facility] CATH LAB;  Service: Cardiovascular;  Laterality: N/A;  . PERCUTANEOUS CORONARY STENT INTERVENTION (PCI-S) Right 07/14/2012   Procedure: PERCUTANEOUS CORONARY  STENT INTERVENTION (PCI-S);  Surgeon: [name], MD;  Location: [facility] CATH LAB;  Service: Cardiovascular;  Laterality: Right;        Home Medications    Prior to Admission medications   Medication Sig Start Date End Date Taking? Authorizing Provider  aspirin 81 MG tablet Take 81 mg by mouth daily.      [provider]  carvedilol (COREG) 3.125 MG  tablet Take 1 tablet (3.125 mg total) by mouth 2 (two) times daily with a meal. 12/25/17   Duke, Tami Lin, PA  glucose blood (ACCU-CHEK AVIVA) test strip 4 (four) times daily. DX code e11.9 12/25/17   Daune Perch, NP  insulin aspart (NOVOLOG) 100 UNIT/ML injection Inject 5 Units into the skin 3 (three) times daily with meals. 12/25/17   Duke, Tami Lin, PA  Insulin Glargine (LANTUS SOLOSTAR) 100 UNIT/ML Solostar Pen Inject 15 Units into the skin daily at 10 pm. NEED APPOINTMENT 12/25/17   Duke, Tami Lin, PA  Insulin Pen Needle (B-D UF III MINI PEN NEEDLES) 31G X 5 MM MISC use to inject insulin daily 12/25/17   Daune Perch, NP  lisinopril (PRINIVIL,ZESTRIL) 20 MG tablet Take 0.5 tablets (10 mg total) by mouth daily. 12/25/17   Duke, Tami Lin, PA  metFORMIN (GLUCOPHAGE) 500 MG tablet 1 tab before breakfast, 1 tab before your evening meal 01/02/18   Dorena Cookey, MD  Multiple Vitamin (MULTIVITAMIN) capsule Take 1 capsule by mouth daily. HOLD UNTIL ABLE TO SWALLOW PILLS WHOLE 01/15/14   [name], MD  pantoprazole (PROTONIX) 40 MG tablet Take 1 tablet (40 mg total) by mouth daily. 12/25/17   Duke, Tami Lin, PA  rosuvastatin (CRESTOR) 40 MG tablet Take 1 tablet (40 mg total) by mouth at bedtime. 12/23/17   Kathi Ludwig, MD  ticagrelor (BRILINTA) 90 MG TABS tablet Take 1 tablet (90 mg total) by mouth 2 (two) times daily. 12/25/17   Duke, Tami Lin, PA    Family History Family History  Problem Relation Age of Onset  . Diabetes Mother   . Diabetes Paternal Grandmother   . Diabetes Maternal Grandfather   . Colon cancer Neg Hx     Social History Social History   Tobacco Use  . Smoking status: Never Smoker  . Smokeless tobacco: Never Used  Substance Use Topics  . Alcohol use: No  . Drug use: No     Allergies   Lipitor [atorvastatin]   Review of Systems Review of Systems  Skin: Positive for wound.  Allergic/Immunologic: Positive for  immunocompromised state.  Neurological: Positive for light-headedness.  All other systems reviewed and are negative.    Physical Exam Updated Vital Signs BP 123/63 (BP Location: Right Arm)   Pulse (!) 101   Temp (!) 103.1 F (39.5 C) (Rectal)   Resp 18   SpO2 100%   Physical Exam  Constitutional: He appears well-developed and well-nourished. No distress.  HENT:  Dime sized scabbed wound over right cheek otherwise normocephalic atraumatic  Eyes: Pupils are equal, round, and reactive to light. Conjunctivae are normal.  Neck: Neck supple. No tracheal deviation present. No thyromegaly present.  Cardiovascular: Normal rate and regular rhythm.  No murmur heard. Pulmonary/Chest: Effort normal and breath sounds normal.  Abdominal: Soft. Bowel sounds are normal. He exhibits no distension. There is no tenderness.  Genitourinary: Penis normal.  Musculoskeletal: Normal range of motion. He exhibits no edema or tenderness.  Left lower extremity dorsum of foot there is a 3 cm open area which  is weeping purulent material dorsum of foot is diffusely reddened and warm.  There is a red streak at the medial aspect of the lower leg going to the knee.  No inguinal nodes.  There is a dime sized ulcer on plantar surface of the foot which is clean appearing.  DP pulse 2+ good capillary refill.  All other extremities without redness swelling or tenderness neurovascular intact  Neurological: He is alert. Coordination normal.  Skin: Skin is warm and dry. No rash noted.  Psychiatric: He has a normal mood and affect.  Nursing note and vitals reviewed.    ED Treatments / Results  Labs (all labs ordered are listed, but only abnormal results are displayed) Labs Reviewed  CULTURE, BLOOD (ROUTINE X 2)  CULTURE, BLOOD (ROUTINE X 2)  COMPREHENSIVE METABOLIC PANEL  CBC WITH DIFFERENTIAL/PLATELET  I-STAT CG4 LACTIC ACID, ED  X-ray reviewed by me Results for orders placed or performed during the hospital  encounter of 01/12/18  Comprehensive metabolic panel  Result Value Ref Range   Sodium 131 (L) 135 - 145 mmol/L   Potassium 3.4 (L) 3.5 - 5.1 mmol/L   Chloride 98 (L) 101 - 111 mmol/L   CO2 22 22 - 32 mmol/L   Glucose, Bld 64 (L) 65 - 99 mg/dL   BUN 20 6 - 20 mg/dL   Creatinine, Ser 1.38 (H) 0.61 - 1.24 mg/dL   Calcium 8.0 (L) 8.9 - 10.3 mg/dL   Total Protein 6.4 (L) 6.5 - 8.1 g/dL   Albumin 2.3 (L) 3.5 - 5.0 g/dL   AST 38 15 - 41 U/L   ALT 26 17 - 63 U/L   Alkaline Phosphatase 71 38 - 126 U/L   Total Bilirubin 1.6 (H) 0.3 - 1.2 mg/dL   GFR calc non Af Amer 51 (L) >60 mL/min   GFR calc Af Amer 59 (L) >60 mL/min   Anion gap 11 5 - 15  CBC WITH DIFFERENTIAL  Result Value Ref Range   WBC 18.2 (H) 4.0 - 10.5 K/uL   RBC 3.54 (L) 4.22 - 5.81 MIL/uL   Hemoglobin 10.6 (L) 13.0 - 17.0 g/dL   HCT 31.2 (L) 39.0 - 52.0 %   MCV 88.1 78.0 - 100.0 fL   MCH 29.9 26.0 - 34.0 pg   MCHC 34.0 30.0 - 36.0 g/dL   RDW 12.5 11.5 - 15.5 %   Platelets 316 150 - 400 K/uL   Neutrophils Relative % 79 %   Neutro Abs 14.3 (H) 1.7 - 7.7 K/uL   Lymphocytes Relative 9 %   Lymphs Abs 1.7 0.7 - 4.0 K/uL   Monocytes Relative 12 %   Monocytes Absolute 2.2 (H) 0.1 - 1.0 K/uL   Eosinophils Relative 0 %   Eosinophils Absolute 0.0 0.0 - 0.7 K/uL   Basophils Relative 0 %   Basophils Absolute 0.0 0.0 - 0.1 K/uL  I-Stat CG4 Lactic Acid, ED  (not at  [facility])  Result Value Ref Range   Lactic Acid, Venous 1.17 0.5 - 1.9 mmol/L  CBG monitoring, ED  Result Value Ref Range   Glucose-Capillary 44 (LL) 65 - 99 mg/dL   Comment 1 Notify RN    Comment 2 Document in Chart    Dg Foot Complete Left  Result Date: 01/12/2018 CLINICAL DATA:  Soft tissue infection of the left foot for 1 day in a diabetic patient. Initial encounter. EXAM: LEFT FOOT - COMPLETE 3+ VIEW COMPARISON:  None. FINDINGS: A skin ulceration and soft tissue gas  are seen over the dorsum of the foot at and proximal to the great toe. No radiopaque foreign body  is identified. There is no bony destructive change or periosteal reaction. 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 IMPRESSION: Findings consistent with cellulitis centered about the right great toe and first metatarsal. No plain film evidence of osteomyelitis. Moderate right first MTP osteoarthritis. Electronically Signed   By: Inge Rise M.D.   On: 01/12/2018 16:41    EKG Interpretation  Date/Time:  Thursday January 12 2018 15:26:49 EDT Ventricular Rate:  101 PR Interval:    QRS Duration: 86 QT Interval:  350 QTC Calculation: 416 R Axis:   -73 Text Interpretation:  Sinus tachycardia Probable left atrial enlargement Inferior infarct, old Anterior infarct, age indeterminate SINCE LAST TRACING HEART RATE HAS INCREASED Confirmed by Orlie Dakin [phone]) on 01/12/2018 4:08:04 PM Also confirmed by Orlie Dakin [phone]), editor Philomena Doheny [phone])  on 01/12/2018 4:15:11 PM     Sepsis - Repeat Assessment  Performed at:    520 pm  Vitals     Blood pressure 118/85, pulse (!) 106, temperature (!) 103.1 F (39.5 C), temperature source Rectal, resp. rate (!) 34, SpO2 100 %.  Heart:     Regular rate and rhythm  Lungs:    CTA  Capillary Refill:   <2 sec  Peripheral Pulse:   Radial pulse palpable  Skin:     Normal Color   EKG None  Radiology No results found.  Procedures Procedures (including critical care time) 5:20 PM patient reports he feels improved after treatment with intravenous antibiotics and Tylenol Medications Ordered in ED Medications  piperacillin-tazobactam (ZOSYN) IVPB 3.375 g (has no administration in time range)  vancomycin (VANCOCIN) IVPB 1000 mg/200 mL premix (has no administration in time range)     Initial Impression / Assessment and Plan / ED Course  I have reviewed the triage vital signs and the nursing notes.  Pertinent labs & imaging results that were available during my care of the patient were reviewed by me and considered in my  medical decision making (see chart for details).     Code sepsis called by me based on sirs criteria of pulse and temperature.  Source of infection soft tissue infection of left lower extremity 5:40 PM I was notified the patient had CBG of 44 consistent with hypoglycemia.  On reexam he is alert he states he felt somewhat sweaty.   intravenous D50 ordered as well as food for patient.  I consulted Dr. Lorin Mercy from hospitalist service will arrange for admission.  Lab work is consistent with hypoglycemia, mild hypocalcemia and hyperbilirubinemia, acute kidney injury and hyponatremia.  Oral potassium supplementation ordered.  Patient also noted to be anemic, hemoglobin unchanged from 3 weeks ago. Final Clinical Impressions(s) / ED Diagnoses  Diagnoses #1 sepsis #2 cellulitis of left lower extremity #3 acute kidney injury Final diagnoses:  None  #4 hypocalcemia  #5 hypokalemia #6 hyperbilirubinemia #7 anemia CRITICAL CARE Performed by: Orlie Dakin Total critical care time: 40 minutes Critical care time was exclusive of separately billable procedures and treating other patients. Critical care was necessary to treat or prevent imminent or life-threatening deterioration. Critical care was time spent personally by me on the following activities: development of treatment plan with patient and/or surrogate as well as nursing, discussions with consultants, evaluation of patient's response to treatment, examination of patient, obtaining history from patient or surrogate, ordering and performing treatments and interventions, ordering and review of laboratory studies, ordering  and review of radiographic studies, pulse oximetry and re-evaluation of patient's condition.  ED Discharge Orders    None       Orlie Dakin, MD 01/12/18 1753

## 2018-01-12 NOTE — H&P (Signed)
History and Physical    David Lane DXI:[id number] DOB: [birth year] DOA: 01/12/2018  PCP: Dorena Cookey, MD Consultants:  Croitoru - cardiology; Loanne Drilling - endocrinology  Patient coming from:  Home - lives alone; [place]: sister, [phone]  Chief Complaint:  cellulitis  HPI: David Lane is a 72 y.o. male with medical history significant of CAD with recent STEMI (3/26-31) s/p stent on Brilinta; HTN; HLD: combined CHF (EF 35-40% with grade 1 diastolic dysfunction on 0/24); and DM presenting with cellulitis and left foot wound.  His left foot had an ulcer on the bottom of his foot.  It didn't get better or worse for a while.  He saw his PCP recently and told him about it.  PCP suggested soaking twice a day and placing antibiotic ointment and a bandaid on it.   He went to bed last night with foot looking great.  This AM, the entire top of his foot looked like it had a big fluid-fiulled blister on it.  He noticed streaks up the leg looking like infection.  He has been sick all week because the Metformin gave him diarhea for 2-3 days.  He has been too dizzy to drive and has not been getting off the couch.  Years ago, he was unable to take Metformin due to diarrhea; he was restarted on it during his hospitalization.  The diarrhea started Monday-Tuesday of this week causing weakness, dizziness, not very mobile.  He fell and was unable to stay home alone at night.  No n/v.  He complained that he was freezing today but has not checked a temperature.  Slight cough.   ED Course:   Code sepsis - fever to 103.1, diabetic with cellulitis with ascending lymphangitis to left foot, open draining wound with clean ulcer.  Given Vanc/Zosyn, pancultured.  Glucose 44, given 1/2 amp D50 and fed.   Review of Systems: As per HPI; otherwise review of systems reviewed and negative.   Ambulatory Status:  Ambulates without assistance  Past Medical History:  Diagnosis Date  . CAD (coronary artery disease)    last  cath by [facility] DR.  Mihai Croitoru showing  some  disease involving LCX and small size of Diag   . CHF exacerbation, due to diastolic dysfunction 09/73/5329  . Chronic systolic CHF (congestive heart failure), NYHA class 1 ([place]) 07/17/2012  . GERD (gastroesophageal reflux disease)   . Hepatic lesion 02/04/11  . Hyperlipemia   . Hypertension   . Ischemic cardiomyopathy    EF 35-40%  . Liver hemangioma   . NSTEMI (non-ST elevated myocardial infarction) ([place]) 11/21/2009  . Pancreatitis 2000's  . Shortness of breath   . Type II diabetes mellitus ([place])     Past Surgical History:  Procedure Laterality Date  . CARDIAC CATHETERIZATION  2011   minimal disease, medical management  . CORONARY ANGIOPLASTY WITH STENT PLACEMENT  07/14/2012   successful PCI & stenting of mid LAD & PDA off the dominant CX  . CORONARY/GRAFT ACUTE MI REVASCULARIZATION N/A 12/20/2017   Procedure: Coronary/Graft Acute MI Revascularization;  Surgeon: Martinique, Peter M, MD;  Location: [place] [facility];  Service: Cardiovascular;  Laterality: N/A;  . INCISION AND DRAINAGE ABSCESS ANAL  1970's  . LEFT HEART CATH AND CORONARY ANGIOGRAPHY N/A 12/20/2017   Procedure: LEFT HEART CATH AND CORONARY ANGIOGRAPHY;  Surgeon: Martinique, Peter M, MD;  Location: [place] [facility];  Service: Cardiovascular;  Laterality: N/A;  . LEFT HEART CATHETERIZATION WITH CORONARY ANGIOGRAM N/A 07/14/2012  Procedure: LEFT HEART CATHETERIZATION WITH CORONARY ANGIOGRAM;  Surgeon: [name], MD;  Location: [facility] CATH LAB;  Service: Cardiovascular;  Laterality: N/A;  . PERCUTANEOUS CORONARY STENT INTERVENTION (PCI-S) Right 07/14/2012   Procedure: PERCUTANEOUS CORONARY STENT INTERVENTION (PCI-S);  Surgeon: [name], MD;  Location: [facility] CATH LAB;  Service: Cardiovascular;  Laterality: Right;    Social History   Socioeconomic History  . Marital status: Single    Spouse name: Not on file  . Number of children: 0  . Years of education: Not on file  .  Highest education level: Not on file  Occupational History  . Occupation: retired    Fish farm manager: [employer]  Social Needs  . Financial resource strain: Not on file  . Food insecurity:    Worry: Not on file    Inability: Not on file  . Transportation needs:    Medical: Not on file    Non-medical: Not on file  Tobacco Use  . Smoking status: Never Smoker  . Smokeless tobacco: Never Used  Substance and Sexual Activity  . Alcohol use: No  . Drug use: No  . Sexual activity: Never  Lifestyle  . Physical activity:    Days per week: Not on file    Minutes per session: Not on file  . Stress: Not on file  Relationships  . Social connections:    Talks on phone: Not on file    Gets together: Not on file    Attends religious service: Not on file    Active member of club or organization: Not on file    Attends meetings of clubs or organizations: Not on file    Relationship status: Not on file  . Intimate partner violence:    Fear of current or ex partner: Not on file    Emotionally abused: Not on file    Physically abused: Not on file    Forced sexual activity: Not on file  Other Topics Concern  . Not on file  Social History Narrative  . Not on file    Allergies  Allergen Reactions  . Lipitor [Atorvastatin] Other (See Comments)    Muscle weakness  . Metformin And Related Diarrhea    Severe diarrhea    Family History  Problem Relation Age of Onset  . Diabetes Mother   . Diabetes Paternal Grandmother   . Diabetes Maternal Grandfather   . Colon cancer Neg Hx     Prior to Admission medications   Medication Sig Start Date End Date Taking? Authorizing Provider  aspirin 81 MG tablet Take 81 mg by mouth daily.      [provider]  carvedilol (COREG) 3.125 MG tablet Take 1 tablet (3.125 mg total) by mouth 2 (two) times daily with a meal. 12/25/17   [facility], Tami Lin, PA  glucose blood (ACCU-CHEK AVIVA) test strip 4 (four) times daily. DX code e11.9 12/25/17   Daune Perch, NP  insulin aspart (NOVOLOG) 100 UNIT/ML injection Inject 5 Units into the skin 3 (three) times daily with meals. 12/25/17   [facility], Tami Lin, PA  Insulin Glargine (LANTUS SOLOSTAR) 100 UNIT/ML Solostar Pen Inject 15 Units into the skin daily at 10 pm. NEED APPOINTMENT 12/25/17   [facility], Tami Lin, PA  Insulin Pen Needle (B-D UF III MINI PEN NEEDLES) 31G X 5 MM MISC use to inject insulin daily 12/25/17   Daune Perch, NP  lisinopril (PRINIVIL,ZESTRIL) 20 MG tablet Take 0.5 tablets (10 mg total) by mouth daily. 12/25/17   [facility],  Tami Lin, PA  metFORMIN (GLUCOPHAGE) 500 MG tablet 1 tab before breakfast, 1 tab before your evening meal 01/02/18   Dorena Cookey, MD  Multiple Vitamin (MULTIVITAMIN) capsule Take 1 capsule by mouth daily. HOLD UNTIL ABLE TO SWALLOW PILLS WHOLE 01/15/14   [name], MD  pantoprazole (PROTONIX) 40 MG tablet Take 1 tablet (40 mg total) by mouth daily. 12/25/17   [facility], Tami Lin, PA  rosuvastatin (CRESTOR) 40 MG tablet Take 1 tablet (40 mg total) by mouth at bedtime. 12/23/17   Kathi Ludwig, MD  ticagrelor (BRILINTA) 90 MG TABS tablet Take 1 tablet (90 mg total) by mouth 2 (two) times daily. 12/25/17   Ledora Bottcher, PA    Physical Exam: Vitals:   01/12/18 1537 01/12/18 1730 01/12/18 1824 01/12/18 1830  BP:  109/64 107/72 97/65  Pulse:  89 87 80  Resp:  18    Temp: (!) 103.1 F (39.5 C)     TempSrc: Rectal     SpO2:  99% 100% 100%     General:  Appears calm and comfortable and is NAD Eyes:  PERRL, EOMI, normal lids, iris ENT:  grossly normal hearing, lips & tongue, mmm Neck:  no LAD, masses or thyromegaly Cardiovascular:  Mild tachycardia, no m/r/g. Respiratory:   CTA bilaterally with no wheezes/rales/rhonchi.  Normal respiratory effort. Abdomen:  soft, NT, ND, NABS Back:   normal alignment, no CVAT Skin:  There is an ulceration on the base of the first left metatarsal that is not overly concerning.  However, on further  inspection it likely tracks with the ulceration on his dorsal foot at the base of the first toe.  This has a yellow, purulent drainage coming from it with marked ecchymosis and significant surrounding erythema and edema along the entire dorsal foot and extending in a confluent pattern on the ankle and then streaking up to the knee.          Musculoskeletal:  grossly normal tone BUE/BLE, good ROM, no bony abnormality other than as described above Psychiatric:  grossly normal mood and affect, speech fluent and appropriate, AOx3 Neurologic:  CN 2-12 grossly intact, moves all extremities in coordinated fashion    Radiological Exams on Admission: Dg Chest Port 1 View  Result Date: 01/12/2018 CLINICAL DATA:  Sepsis EXAM: PORTABLE CHEST 1 VIEW COMPARISON:  None. FINDINGS: Normal mediastinum and cardiac silhouette. Normal pulmonary vasculature. No evidence of effusion, infiltrate, or pneumothorax. No acute bony abnormality. IMPRESSION: No acute cardiopulmonary process. Electronically Signed   By: Suzy Bouchard M.D.   On: 01/12/2018 20:06   Dg Foot Complete Left  Result Date: 01/12/2018 CLINICAL DATA:  Soft tissue infection of the left foot for 1 day in a diabetic patient. Initial encounter. EXAM: LEFT FOOT - COMPLETE 3+ VIEW COMPARISON:  None. FINDINGS: A skin ulceration and soft tissue gas are seen over the dorsum of the foot at and proximal to the great toe. No radiopaque foreign body is identified. There is no bony destructive change or periosteal reaction. 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 IMPRESSION: Findings consistent with cellulitis centered about the right great toe and first metatarsal. No plain film evidence of osteomyelitis. Moderate right first MTP osteoarthritis. Electronically Signed   By: Inge Rise M.D.   On: 01/12/2018 16:41    EKG: Independently reviewed.  Sinus tachycardia with rate 101; nonspecific ST changes with no evidence of acute  ischemia   Labs on Admission: I have personally reviewed the available labs  and imaging studies at the time of the admission.  Pertinent labs:   Glucose 64, 44 Na++ 131 BUN 20/Creatinine 1.38/GFR 51; 13/1.12/>60 on 3/29 WBC 18.2 Hgb 10.6   Assessment/Plan Principal Problem:   Sepsis due to cellulitis [facility]) Active Problems:   Hyperlipidemia   Essential hypertension   Chronic combined systolic and diastolic CHF (congestive heart failure) (HCC)   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 (HCC)   Status post coronary artery stent placement   LE wound infection with cellulitis resulting in sepsis -SIRS criteria in this patient includes: Leukocytosis, fever, tachycardia, tachypnea  -Patient has evidence of acute organ failure with hypotension MAP 61 (BP 79/51) -While awaiting blood cultures, this appears to be a preseptic condition. -Sepsis protocol initiated -Patient had SBP <90/MAP <65 and so has received the 30 cc/kg IVF bolus. -Blood and urine cultures pending -He clearly has an impressive ulcer with marked surrounding cellulitis -He was given Vanc and Zosyn in the ER, will continue -While the patient believes that his ulcer was stable until recently, this appears to be a much deeper infection and is concerning for osteomyelitis -There appears to be a significant risk of need for amputation - I have discussed this with the patient -Will admit, Med Surg -With negative X-ray, will need MRI to assess for osteomyelitis -Needs consult by orthopedics, likely Dr. Sharol Given, tomorrow, depending on MRI results -Diabetic foot infection order set utilized, including orders for CM, SW, DM coordinator, wound care, and nutrition consult. -Goal would be for glucose <150 to facilitate wound healing. -Patient should be on bed rest, non-weight bearing. -Excellent BP control is needed  -ABI has been ordered  CAD -Recent admission for STEMI with 100% stenosis of mid LAD s/p DES -He  also has 3-vessel obstructive disease -Plan is for long-term DAPT -Will continue ASA, Brilinta -If he needs operative intervention for his foot ulcer, he will need cardiac clearance prior  DM -A1c 10 during prior hospitalization -He was quite hypoglycemic in the ER -His d/c instructions from cardiology state to take 15 units qPM and he is instead taking 80 units qAM -Will hold Lantus for now -He has also had GI symptoms which he attributes to Metformin; will d/c Metformin and see if these resolve (if not, he may require C diff testing)  HTN -Continue Coreg starting in AM -Hold Zestril for now given hypotension with sepsis  HLD -Continue Crestor   DVT prophylaxis: Heparin Code Status:  Full - confirmed with patient/family Family Communication: Sister present throughout evaluation  Disposition Plan:  Home once clinically improved Consults called: CM, SW, DM coordinator, wound care, and nutrition Admission status: Admit - It is my clinical opinion that admission to INPATIENT is reasonable and necessary because of the expectation that this patient will require hospital care that crosses at least 2 midnights to treat this condition based on the medical complexity of the problems presented.  Given the aforementioned information, the predictability of an adverse outcome is felt to be significant.    Karmen Bongo MD [facility]  If note is complete, please contact covering daytime or nighttime physician. [email or web address] Password [facility]  01/12/2018, 9:12 PM

## 2018-01-13 ENCOUNTER — Other Ambulatory Visit: Payer: Self-pay

## 2018-01-13 ENCOUNTER — Inpatient Hospital Stay (HOSPITAL_COMMUNITY): Payer: Medicare Other

## 2018-01-13 ENCOUNTER — Encounter (HOSPITAL_COMMUNITY): Payer: Medicare Other

## 2018-01-13 LAB — SEDIMENTATION RATE: Sed Rate: 105 mm/hr — ABNORMAL HIGH (ref 0–16)

## 2018-01-13 LAB — CBC
HEMATOCRIT: 28.6 % — AB (ref 39.0–52.0)
HEMOGLOBIN: 9.7 g/dL — AB (ref 13.0–17.0)
MCH: 29.8 pg (ref 26.0–34.0)
MCHC: 33.9 g/dL (ref 30.0–36.0)
MCV: 88 fL (ref 78.0–100.0)
Platelets: 308 10*3/uL (ref 150–400)
RBC: 3.25 MIL/uL — AB (ref 4.22–5.81)
RDW: 12.7 % (ref 11.5–15.5)
WBC: 14.8 10*3/uL — AB (ref 4.0–10.5)

## 2018-01-13 LAB — BASIC METABOLIC PANEL
ANION GAP: 9 (ref 5–15)
BUN: 18 mg/dL (ref 6–20)
CHLORIDE: 104 mmol/L (ref 101–111)
CO2: 22 mmol/L (ref 22–32)
Calcium: 7.7 mg/dL — ABNORMAL LOW (ref 8.9–10.3)
Creatinine, Ser: 1.23 mg/dL (ref 0.61–1.24)
GFR calc Af Amer: >60 mL/min (ref >60)
GFR calc non Af Amer: 58 mL/min — ABNORMAL LOW (ref >60)
Glucose, Bld: 136 mg/dL — ABNORMAL HIGH (ref 65–99)
Potassium: 3.7 mmol/L (ref 3.5–5.1)
SODIUM: 135 mmol/L (ref 135–145)

## 2018-01-13 LAB — GLUCOSE, CAPILLARY
GLUCOSE-CAPILLARY: 205 mg/dL — AB (ref 65–99)
GLUCOSE-CAPILLARY: 284 mg/dL — AB (ref 65–99)
Glucose-Capillary: 129 mg/dL — ABNORMAL HIGH (ref 65–99)

## 2018-01-13 LAB — PREALBUMIN: Prealbumin: <5 mg/dL — ABNORMAL LOW (ref 18–38)

## 2018-01-13 LAB — C-REACTIVE PROTEIN: CRP: 19.9 mg/dL — ABNORMAL HIGH (ref <1.0)

## 2018-01-13 MED ORDER — POLYVINYL ALCOHOL 1.4 % OP SOLN
1.0000 [drp] | OPHTHALMIC | Status: DC | PRN
  Administered 2018-01-15: 1 [drp] via OPHTHALMIC
  Filled 2018-01-13: qty 15

## 2018-01-13 MED ORDER — TICAGRELOR 90 MG PO TABS
90.0000 mg | ORAL_TABLET | Freq: Two times a day (BID) | ORAL | Status: DC
  Administered 2018-01-13 – 2018-01-20 (×15): 90 mg via ORAL
  Filled 2018-01-13 (×17): qty 1

## 2018-01-13 MED ORDER — ASPIRIN 81 MG PO CHEW
81.0000 mg | CHEWABLE_TABLET | Freq: Every day | ORAL | Status: DC
  Administered 2018-01-13 – 2018-01-20 (×7): 81 mg via ORAL
  Filled 2018-01-13 (×7): qty 1

## 2018-01-13 MED ORDER — ROSUVASTATIN CALCIUM 10 MG PO TABS
40.0000 mg | ORAL_TABLET | Freq: Every day | ORAL | Status: DC
  Administered 2018-01-13 – 2018-01-19 (×7): 40 mg via ORAL
  Filled 2018-01-13 (×8): qty 4

## 2018-01-13 MED ORDER — ONDANSETRON HCL 4 MG PO TABS: 4.0000 mg | ORAL_TABLET | Freq: Four times a day (QID) | ORAL | Status: DC | PRN

## 2018-01-13 MED ORDER — DOCUSATE SODIUM 100 MG PO CAPS
100.0000 mg | ORAL_CAPSULE | Freq: Two times a day (BID) | ORAL | Status: DC
  Administered 2018-01-13 (×2): 100 mg via ORAL
  Filled 2018-01-13 (×3): qty 1

## 2018-01-13 MED ORDER — INSULIN ASPART 100 UNIT/ML ~~LOC~~ SOLN
0.0000 [IU] | Freq: Every day | SUBCUTANEOUS | Status: DC
  Administered 2018-01-13 – 2018-01-14 (×2): 3 [IU] via SUBCUTANEOUS
  Administered 2018-01-15: 4 [IU] via SUBCUTANEOUS
  Administered 2018-01-16: 3 [IU] via SUBCUTANEOUS
  Administered 2018-01-18: 5 [IU] via SUBCUTANEOUS

## 2018-01-13 MED ORDER — HEPARIN SODIUM (PORCINE) 5000 UNIT/ML IJ SOLN
5000.0000 [IU] | Freq: Three times a day (TID) | INTRAMUSCULAR | Status: DC
  Administered 2018-01-13 (×2): 5000 [IU] via SUBCUTANEOUS
  Filled 2018-01-13 (×2): qty 1

## 2018-01-13 MED ORDER — ONDANSETRON HCL 4 MG/2ML IJ SOLN: 4.0000 mg | Freq: Four times a day (QID) | INTRAMUSCULAR | Status: DC | PRN

## 2018-01-13 MED ORDER — CARVEDILOL 3.125 MG PO TABS
3.1250 mg | ORAL_TABLET | Freq: Two times a day (BID) | ORAL | Status: DC
  Administered 2018-01-13 – 2018-01-20 (×16): 3.125 mg via ORAL
  Filled 2018-01-13 (×16): qty 1

## 2018-01-13 MED ORDER — INSULIN ASPART 100 UNIT/ML ~~LOC~~ SOLN
0.0000 [IU] | Freq: Three times a day (TID) | SUBCUTANEOUS | Status: DC
  Administered 2018-01-13: 1 [IU] via SUBCUTANEOUS
  Administered 2018-01-13: 3 [IU] via SUBCUTANEOUS
  Administered 2018-01-14: 5 [IU] via SUBCUTANEOUS
  Administered 2018-01-14: 2 [IU] via SUBCUTANEOUS
  Administered 2018-01-14: 3 [IU] via SUBCUTANEOUS
  Administered 2018-01-14 – 2018-01-15 (×2): 7 [IU] via SUBCUTANEOUS
  Administered 2018-01-15 (×2): 5 [IU] via SUBCUTANEOUS
  Administered 2018-01-16: 9 [IU] via SUBCUTANEOUS
  Administered 2018-01-16: 5 [IU] via SUBCUTANEOUS
  Administered 2018-01-16 – 2018-01-17 (×2): 7 [IU] via SUBCUTANEOUS
  Administered 2018-01-17: 5 [IU] via SUBCUTANEOUS
  Administered 2018-01-17 – 2018-01-18 (×2): 3 [IU] via SUBCUTANEOUS
  Administered 2018-01-18: 1 [IU] via SUBCUTANEOUS
  Administered 2018-01-19 (×2): 3 [IU] via SUBCUTANEOUS
  Administered 2018-01-20: 2 [IU] via SUBCUTANEOUS
  Administered 2018-01-20: 1 [IU] via SUBCUTANEOUS

## 2018-01-13 MED ORDER — PANTOPRAZOLE SODIUM 40 MG PO TBEC
40.0000 mg | DELAYED_RELEASE_TABLET | Freq: Every day | ORAL | Status: DC
  Administered 2018-01-13 – 2018-01-20 (×7): 40 mg via ORAL
  Filled 2018-01-13 (×7): qty 1

## 2018-01-13 MED ORDER — LACTATED RINGERS IV SOLN
INTRAVENOUS | Status: DC
  Administered 2018-01-13 – 2018-01-14 (×4): via INTRAVENOUS

## 2018-01-13 MED ORDER — ACETAMINOPHEN 650 MG RE SUPP: 650.0000 mg | Freq: Four times a day (QID) | RECTAL | Status: DC | PRN

## 2018-01-13 MED ORDER — PRO-STAT SUGAR FREE PO LIQD
30.0000 mL | Freq: Two times a day (BID) | ORAL | Status: DC
  Administered 2018-01-13 – 2018-01-20 (×14): 30 mL via ORAL
  Filled 2018-01-13 (×13): qty 30

## 2018-01-13 MED ORDER — ACETAMINOPHEN 325 MG PO TABS
650.0000 mg | ORAL_TABLET | Freq: Four times a day (QID) | ORAL | Status: DC | PRN
  Administered 2018-01-13: 650 mg via ORAL
  Filled 2018-01-13: qty 2

## 2018-01-13 MED ORDER — GADOBENATE DIMEGLUMINE 529 MG/ML IV SOLN
20.0000 mL | Freq: Once | INTRAVENOUS | Status: AC | PRN
  Administered 2018-01-13: 20 mL via INTRAVENOUS

## 2018-01-13 NOTE — Consult Note (Signed)
[facility] Nurse wound consult note Reason for Consult: Left foot wounds Wound type: Diabetic foot ulcers POA:  Yes Measurement:  Wound 1:  Located on the left 1st metatarsal head measures 1.3 cm x 1.3 cm x 1.3cm.  The entire plantar 1st metatarsal head area is heavily callused.  There is purulent drainage from the wound.  The wound bed cannot be visualized.  Wound 2:  Located on the left 5th metatarsal head measures 1.4 cm x 1.2 cm x unknown depth.  The entire area is heavily callused.  Wound 3:  Dorsum of the left foot at the great toe/2nd toe area is purple and has purulent material draining out of the wound.  The area is consistent with a burst blood bulla.  The patient and talked at length about the necessity for further testing and a surgical consult.  He verbalizes his understanding of the severity of the wounds.  Plan of care for the area is for nursing to place a xeroform gauze over the great toe and 1st metatarsal wounds, and to wrap in Kerlex.  Change daily.  This wound care dressing was performed for today while I was talking with the patient. Thank you for the consult.  Discussed plan of care with the patient and bedside nurse.  [place] nurse will not follow at this time.  Please re-consult the [facility] team if needed.  Val Riles, RN, MSN, CWOCN, CNS-BC, pager [phone]

## 2018-01-13 NOTE — Progress Notes (Signed)
Pt is very anxious about the upcoming procedure. Emotional support was provided by RN. Spiritual care was refused.

## 2018-01-13 NOTE — Progress Notes (Signed)
Inpatient Diabetes Program Recommendations  AACE/ADA: New Consensus Statement on Inpatient Glycemic Control (2015)  Target Ranges:  Prepandial:   less than 140 mg/dL      Peak postprandial:   less than 180 mg/dL (1-2 hours)      Critically ill patients:  140 - 180 mg/dL   Lab Results  Component Value Date   GLUCAP 129 (H) 01/13/2018   HGBA1C 10.0 (H) 12/21/2017    Review of Glycemic Control Results for David Lane, David Lane (MRN [id number]) as of 01/13/2018 15:19  Ref. Range 12/25/2017 11:58 01/12/2018 17:35 01/12/2018 18:28 01/12/2018 20:57 01/13/2018 06:58  Glucose-Capillary Latest Ref Range: 65 - 99 mg/dL 212 (H) 44 (LL) 128 (H) 140 (H) 129 (H)   Diabetes history: Type 2 DM Outpatient Diabetes medications: Lantus 80 units QHS Current orders for Inpatient glycemic control: Novolog 0-9 units TID, Novolog 0-5 units QHS  Inpatient Diabetes Program Recommendations:    Spoke with patient regarding regimen at home. Patient sees Dr Loanne Drilling. Last hospitalization patient was seen by our diabetes team and was encouraged to see Dr Loanne Drilling as soon as possible following discharge and was sent home on Lantus 15 units QHS. Patient has not yet revisited the office. Instead, patient continued to take Lantus 80 units QHS. Patient states, " I was having my meter read high, so I didn't know what else to do but give my insulin."  Patient is clearly overwhelmed in managing his health care. Reiterated the need for a different outpatient regimen.Would not recommend Lantus 80 units at discharge based on current inpatient regimen.  Discussed at length, that what he was doing at home was not working to control his diabetes that he most likely needs multiple injections. The patient states, that Dr Loanne Drilling even attempted to suggest a second opinion and split his doses. Upon arrival to the ED, blood sugar was in 60's. Patient is adamant that he needs that much insulin. Unsure at how much of our discussion was retained. Will give  0$ co-pay card for Lantus. We discussed freestyle Elenor Legato as an option for monitoring, as well as V-Go.   Will place case management consult to give list for additional endocrinologists.   Thanks, Bronson Curb, MSN, RNC-OB Diabetes Coordinator [phone] (8a-5p)

## 2018-01-13 NOTE — Progress Notes (Signed)
Initial Nutrition Assessment  DOCUMENTATION CODES:   Not applicable  INTERVENTION:    Prostat liquid protein po 30 ml BID with meals, each supplement provides 100 kcal, 15 grams protein  NUTRITION DIAGNOSIS:   Increased nutrient needs related to wound healing as evidenced by estimated needs  GOAL:   Patient will meet greater than or equal to 90% of their needs  MONITOR:   PO intake, Supplement acceptance, Labs, Skin, Weight trends, I & O's  REASON FOR ASSESSMENT:   Consult Wound healing  ASSESSMENT:   72 y.o. male with medical history significant of CAD with recent STEMI (3/26-31) s/p stent on Brilinta; HTN; HLD: combined CHF (EF 35-40% with grade 1 diastolic dysfunction on 4/19); and DM presenting with cellulitis and left foot wound.     RD met with pt at bedside. Pt reports he does not have an appetite. No % PO intake available per flowsheet records.  Typically eats 3 meals per day: Breakfast: boiled egg and grits Lunch:chicken noodle soup Dinner: [employer] cafeteria   No recent unintentional weight loss reported. Labs and medications reviewed. PAB <5. CBG's [id number].  NUTRITION - FOCUSED PHYSICAL EXAM:    Most Recent Value  Orbital Region  No depletion  Upper Arm Region  No depletion  Thoracic and Lumbar Region  Unable to assess  Buccal Region  No depletion  Temple Region  No depletion  Clavicle Bone Region  Mild depletion  Clavicle and Acromion Bone Region  Mild depletion  Scapular Bone Region  Unable to assess  Dorsal Hand  No depletion  Patellar Region  No depletion  Anterior Thigh Region  No depletion  Posterior Calf Region  No depletion  Edema (RD Assessment)  None      Diet Order:  Diet heart healthy/carb modified Room service appropriate? Yes; Fluid consistency: Thin  EDUCATION NEEDS:   No education needs have been identified at this time  Skin:  Skin Assessment: Skin Integrity Issues: Skin Integrity Issues:: Other (Comment) Other: LE  wound infection   Last BM:  4/18  Height:   Ht Readings from Last 1 Encounters:  01/13/18 6' (1.829 m)    Weight:   Wt Readings from Last 1 Encounters:  01/13/18 196 lb (88.9 kg)   BMI:  Body mass index is 26.58 kg/m.  Estimated Nutritional Needs:   Kcal:  2000-2200  Protein:  100-115 gm  Fluid:  2.0-2.2 L  Arthur Holms, RD, LDN Pager #: [phone] After-Hours Pager #: [phone]

## 2018-01-13 NOTE — Plan of Care (Signed)
  Problem: Education: Goal: Knowledge of General Education information will improve Outcome: Progressing   Problem: Health Behavior/Discharge Planning: Goal: Ability to manage health-related needs will improve Outcome: Progressing   Problem: Clinical Measurements: Goal: Ability to maintain clinical measurements within normal limits will improve Outcome: Progressing Goal: Will remain free from infection Outcome: Progressing Goal: Diagnostic test results will improve Outcome: Progressing Goal: Respiratory complications will improve Outcome: Progressing Goal: Cardiovascular complication will be avoided Outcome: Progressing   Problem: Activity: Goal: Risk for activity intolerance will decrease Outcome: Progressing   Problem: Nutrition: Goal: Adequate nutrition will be maintained Outcome: Progressing   Problem: Coping: Goal: Level of anxiety will decrease Outcome: Progressing   Problem: Elimination: Goal: Will not experience complications related to bowel motility Outcome: Progressing Goal: Will not experience complications related to urinary retention Outcome: Progressing   Problem: Pain Managment: Goal: General experience of comfort will improve Outcome: Progressing   Problem: Safety: Goal: Ability to remain free from injury will improve Outcome: Progressing   Problem: Skin Integrity: Goal: Risk for impaired skin integrity will decrease Outcome: Progressing   Problem: Clinical Measurements: Goal: Ability to avoid or minimize complications of infection will improve Outcome: Progressing   Problem: Skin Integrity: Goal: Skin integrity will improve Outcome: Progressing   

## 2018-01-13 NOTE — Consult Note (Addendum)
ORTHOPAEDIC CONSULTATION  REQUESTING PHYSICIAN: Bonnell Public, MD  PCP:  Dorena Cookey, MD  Chief Complaint: Diabetic left foot infection  HPI: David Lane is a 72 y.o. male who complains of left foot pain, wound, drainage.  He was admitted to the hospital yesterday with increasing left foot swelling, drainage, erythema.  He has had a wound on the left foot.  He is not sure how long the wound has been there.  He is a type II diabetic.  He had a cardiac cath last month with stent placement.  He is currently on Brilinta.  He has had some issues with glucose control since his cardiac cath.  Past Medical History:  Diagnosis Date  . CAD (coronary artery disease)    last cath by [facility] DR.  Mihai Croitoru showing  some  disease involving LCX and small size of Diag   . CHF exacerbation, due to diastolic dysfunction 13/24/4010  . Chronic systolic CHF (congestive heart failure), NYHA class 1 ([place]) 07/17/2012  . GERD (gastroesophageal reflux disease)   . Hepatic lesion 02/04/11  . Hyperlipemia   . Hypertension   . Ischemic cardiomyopathy    EF 35-40%  . Liver hemangioma   . NSTEMI (non-ST elevated myocardial infarction) ([place]) 11/21/2009  . Pancreatitis 2000's  . Shortness of breath   . Type II diabetes mellitus ([place])    Past Surgical History:  Procedure Laterality Date  . CARDIAC CATHETERIZATION  2011   minimal disease, medical management  . CORONARY ANGIOPLASTY WITH STENT PLACEMENT  07/14/2012   successful PCI & stenting of mid LAD & PDA off the dominant CX  . CORONARY/GRAFT ACUTE MI REVASCULARIZATION N/A 12/20/2017   Procedure: Coronary/Graft Acute MI Revascularization;  Surgeon: Martinique, Peter M, MD;  Location: [place] [facility];  Service: Cardiovascular;  Laterality: N/A;  . INCISION AND DRAINAGE ABSCESS ANAL  1970's  . LEFT HEART CATH AND CORONARY ANGIOGRAPHY N/A 12/20/2017   Procedure: LEFT HEART CATH AND CORONARY ANGIOGRAPHY;  Surgeon: Martinique, Peter M, MD;  Location:  [place] [facility];  Service: Cardiovascular;  Laterality: N/A;  . LEFT HEART CATHETERIZATION WITH CORONARY ANGIOGRAM N/A 07/14/2012   Procedure: LEFT HEART CATHETERIZATION WITH CORONARY ANGIOGRAM;  Surgeon: [name], MD;  Location: [place] CATH LAB;  Service: Cardiovascular;  Laterality: N/A;  . PERCUTANEOUS CORONARY STENT INTERVENTION (PCI-S) Right 07/14/2012   Procedure: PERCUTANEOUS CORONARY STENT INTERVENTION (PCI-S);  Surgeon: [name], MD;  Location: [facility] CATH LAB;  Service: Cardiovascular;  Laterality: Right;   Social History   Socioeconomic History  . Marital status: Single    Spouse name: Not on file  . Number of children: 0  . Years of education: Not on file  . Highest education level: Not on file  Occupational History  . Occupation: retired    Fish farm manager: [employer]  Social Needs  . Financial resource strain: Not on file  . Food insecurity:    Worry: Not on file    Inability: Not on file  . Transportation needs:    Medical: Not on file    Non-medical: Not on file  Tobacco Use  . Smoking status: Never Smoker  . Smokeless tobacco: Never Used  Substance and Sexual Activity  . Alcohol use: No  . Drug use: No  . Sexual activity: Never  Lifestyle  . Physical activity:    Days per week: Not on file    Minutes per session: Not on file  . Stress: Not on file  Relationships  .  Social connections:    Talks on phone: Not on file    Gets together: Not on file    Attends religious service: Not on file    Active member of club or organization: Not on file    Attends meetings of clubs or organizations: Not on file    Relationship status: Not on file  Other Topics Concern  . Not on file  Social History Narrative  . Not on file   Family History  Problem Relation Age of Onset  . Diabetes Mother   . Diabetes Paternal Grandmother   . Diabetes Maternal Grandfather   . Colon cancer Neg Hx    Allergies  Allergen Reactions  . Lipitor [Atorvastatin] Other (See  Comments)    Muscle weakness  . Metformin And Related Diarrhea    Severe diarrhea   Prior to Admission medications   Medication Sig Start Date End Date Taking? Authorizing Provider  aspirin 81 MG tablet Take 81 mg by mouth daily.     Yes [provider]  carvedilol (COREG) 3.125 MG tablet Take 1 tablet (3.125 mg total) by mouth 2 (two) times daily with a meal. 12/25/17  Yes Duke, Tami Lin, PA  Insulin Glargine (LANTUS SOLOSTAR) 100 UNIT/ML Solostar Pen Inject 15 Units into the skin daily at 10 pm. NEED APPOINTMENT Patient taking differently: Inject 80 Units into the skin daily before breakfast.  12/25/17  Yes Duke, Tami Lin, PA  lisinopril (PRINIVIL,ZESTRIL) 20 MG tablet Take 0.5 tablets (10 mg total) by mouth daily. Patient taking differently: Take 10 mg by mouth 2 (two) times daily.  12/25/17  Yes Duke, Tami Lin, PA  pantoprazole (PROTONIX) 40 MG tablet Take 1 tablet (40 mg total) by mouth daily. 12/25/17  Yes Duke, Tami Lin, PA  rosuvastatin (CRESTOR) 40 MG tablet Take 1 tablet (40 mg total) by mouth at bedtime. 12/23/17  Yes Kathi Ludwig, MD  ticagrelor (BRILINTA) 90 MG TABS tablet Take 1 tablet (90 mg total) by mouth 2 (two) times daily. 12/25/17  Yes Duke, Tami Lin, PA  glucose blood (ACCU-CHEK AVIVA) test strip 4 (four) times daily. DX code e11.9 12/25/17   Daune Perch, NP  insulin aspart (NOVOLOG) 100 UNIT/ML injection Inject 5 Units into the skin 3 (three) times daily with meals. 12/25/17   Duke, Tami Lin, PA  Insulin Pen Needle (B-D UF III MINI PEN NEEDLES) 31G X 5 MM MISC use to inject insulin daily 12/25/17   Daune Perch, NP   Mr Foot Left W Wo Contrast  Result Date: 01/13/2018 CLINICAL DATA:  A skin ulceration and soft tissue gas are seen over the dorsum of the foot at and proximal to the great toe. No radiopaque foreign body. EXAM: MRI OF THE LEFT FOREFOOT WITHOUT AND WITH CONTRAST TECHNIQUE: Multiplanar, multisequence MR imaging of the  left forefoot was performed both before and after administration of intravenous contrast. CONTRAST:  63mL MULTIHANCE GADOBENATE DIMEGLUMINE 529 MG/ML IV SOLN COMPARISON:  None. FINDINGS: Bones/Joint/Cartilage Soft tissue wound along the plantar and dorsal medial aspect of the forefoot at the level of the MTP joint. Wound extends deep to the first MTP joint capsule. Small first MTP joint effusion with synovitis and marrow edema at the base of the first proximal phalanx and to a lesser extent first metatarsal head concerning for septic arthritis. 9 x 22 mm fluid collection along the plantar aspect of the first proximal phalanx extending into the medial soft tissues most concerning for an abscess. Large areas of hypoenhancing soft  tissue along the dorsal medial aspect of the forefoot with multiple tiny foci of low signal concerning for extension of an abscess intermixed with necrotic soft tissue. No acute fracture or dislocation. Minimal marrow edema in medial hallux sesamoid and adjacent first metatarsal head likely reflecting reactive marrow changes. Normal alignment. Ligaments Collateral ligaments are intact.  Lisfranc ligament is intact. Muscles and Tendons Flexor, peroneal and extensor compartment tendons are intact. Soft tissue No other fluid collection or hematoma.  No soft tissue mass. IMPRESSION: 1. Soft tissue wound along the plantar and dorsal medial aspect of the forefoot at the level of the MTP joint with surrounding cellulitis. Wound extends deep to the first MTP joint capsule. Septic arthritis of the first MTP joint with adjacent marrow edema. 9 x 22 mm fluid collection along the plantar aspect of the first proximal phalanx extending into the medial and lateral soft tissues most concerning for an abscess. Large areas of hypoenhancing soft tissue along the dorsal medial aspect of the forefoot extending into the first web space with multiple tiny foci of low signal concerning for extension of an abscess  intermixed with necrotic soft tissue concerning for a necrotizing infection. Electronically Signed   By: Kathreen Devoid   On: 01/13/2018 13:28   Dg Chest Port 1 View  Result Date: 01/12/2018 CLINICAL DATA:  Sepsis EXAM: PORTABLE CHEST 1 VIEW COMPARISON:  None. FINDINGS: Normal mediastinum and cardiac silhouette. Normal pulmonary vasculature. No evidence of effusion, infiltrate, or pneumothorax. No acute bony abnormality. IMPRESSION: No acute cardiopulmonary process. Electronically Signed   By: Suzy Bouchard M.D.   On: 01/12/2018 20:06   Dg Foot Complete Left  Result Date: 01/12/2018 CLINICAL DATA:  Soft tissue infection of the left foot for 1 day in a diabetic patient. Initial encounter. EXAM: LEFT FOOT - COMPLETE 3+ VIEW COMPARISON:  None. FINDINGS: A skin ulceration and soft tissue gas are seen over the dorsum of the foot at and proximal to the great toe. No radiopaque foreign body is identified. There is no bony destructive change or periosteal reaction. 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 IMPRESSION: Findings consistent with cellulitis centered about the right great toe and first metatarsal. No plain film evidence of osteomyelitis. Moderate right first MTP osteoarthritis. Electronically Signed   By: Inge Rise M.D.   On: 01/12/2018 16:41    Positive ROS: All other systems have been reviewed and were otherwise negative with the exception of those mentioned in the HPI and as above.  Physical Exam: General: Alert, no acute distress Cardiovascular: No pedal edema Respiratory: No cyanosis, no use of accessory musculature GI: No organomegaly, abdomen is soft and non-tender Skin: No lesions in the area of chief complaint Neurologic: Sensation intact distally Psychiatric: Patient is competent for consent with normal mood and affect Lymphatic: No axillary or cervical lymphadenopathy  MUSCULOSKELETAL: Examination of the left foot reveals he has a full-thickness ulcer over the  plantar aspect of the first metatarsal head.  He also has a full-thickness wound draining purulent material over the dorsal aspect of the MTP joint.  He has streaking cellulitis proceeding up the dorsum of the foot to the ankle.  He does have palpable pulses.  He reports subjective sensory change.  He is able to dorsiflex and plantarflex.  Assessment: Diabetic left foot wound with abscess and osteomyelitis, septic first MTP joint. Recent cardiac cath, PCI with Brilinta use.  Plan: I discussed the findings with the patient.  We discussed the MRI findings in great  detail.  He is likely going to need a partial first ray resection.  Dr. Sharol Given will evaluate the patient tomorrow.  Further plan pending Dr. Jess Barters evaluation. NPO after MN. Hold heparin. OK to continue brillinta per Dr. Sharol Given. Cont IV abx to treat cellulitis.      Bertram Savin, MD Cell [phone]    01/13/2018 6:07 PM

## 2018-01-13 NOTE — Progress Notes (Signed)
PROGRESS NOTE    David Lane  OZH:[id number] DOB: 29-Aug-1946 DOA: 01/12/2018 PCP: Dorena Cookey, MD  Outpatient Specialists:   Brief Narrative: Patient is a 72 year old Caucasian male with past medical history significant for CAD with recent STEMI (3/26-31) s/p stent on Brilinta; HTN; HLD: combined CHF (EF 35-40% with grade 1 diastolic dysfunction on 6/29); and DM presenting with cellulitis and left foot wound.  Patient was admitted with left forefoot abscess, septic arthritis involving the first left MTP and diabetic foot infection.  Discussed with orthopedic team, Dr. Rod Can, Dr. Lyla Glassing has kindly agreed to see patient in consultation.  The patient will likely need surgical procedure.  Discussed unofficially with the cardiology team, and they would prefer patient has antiplatelet to be continued without interruption as the patient just underwent cardiac catheterization and angioplasty on 12/20/2016, and had a drug-eluting stent placed.  Patient is currently on Brilinta.  This could be addressed further with the cardiology team prior to surgery.  Also discussed this with the orthopedic surgeon, Dr. Lyla Glassing.  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 -Sepsis protocol initiated -Patient had SBP <90/MAP <65 and so has received the 30 cc/kg IVF bolus. -Blood and urine cultures pending -He clearly has an  impressive ulcer with marked surrounding cellulitis -He was given Vanc and Zosyn in the ER, will continue -While the patient believes that his ulcer was stable until recently, this appears to be a much deeper infection and is concerning for osteomyelitis -There appears to be a significant risk of need for amputation - I have discussed this with the patient -Will admit, Med Surg -With negative X-ray, will need MRI to assess for osteomyelitis -Needs consult by orthopedics, likely Dr. Sharol Given, tomorrow, depending on MRI results -Diabetic foot infection order set utilized, including orders for CM, SW, DM coordinator, wound care, and nutrition consult. -Goal would be for glucose <150 to facilitate wound healing. -Patient should be on bed rest, non-weight bearing. -Excellent BP control is needed  -ABI has been ordered 01/13/2018-MRI of the left forefoot revealed abscess, septic arthritis involving the first left MTP. -Orthopedic team consulted. -Further care will depend on orthopedic input. -I suspect patient will be going in for surgery.  As documented above, antiplatelet therapy will need to be addressed prior to surgery. -Consider cardiology consult prior to surgery. -Patient may also need vascular work-up prior to surgery.  CAD -Recent admission for STEMI with 100% stenosis of mid LAD s/p DES -He also has 3-vessel obstructive disease -Plan is for long-term DAPT -Will continue ASA, Brilinta -If he needs operative intervention for his foot ulcer, he will need cardiac clearance prior  DM -A1c 10 during prior hospitalization -He was quite hypoglycemic in the ER -His d/c instructions from cardiology state to take 15 units qPM and he is instead taking 80 units qAM -Will hold Lantus for now -He has also had GI symptoms which he attributes to Metformin; will d/c Metformin and see if these resolve (if  not, he may require C diff testing)  HTN -Continue Coreg starting in AM -Hold Zestril for  now given hypotension with sepsis  HLD -Continue Crestor   DVT prophylaxis: Heparin Code Status:  Full  Family Communication:   Disposition Plan:   This will depend on hospital course.    Consultants:   Orthopedist, Dr. Denice Bors Swinteck.  Consider formal cardiology consult prior to surgery.  Procedures:   None  Antimicrobials:   Vancomycin  Zosyn   Subjective: Patient seen.  Reported fever earlier in the day.  No chest pain, no shortness of breath.    Objective: Vitals:   01/13/18 0506 01/13/18 0914 01/13/18 1301 01/13/18 1520  BP: 119/63 108/82  122/71  Pulse: 84 92  86  Resp: 18     Temp: 98 F (36.7 C) 98.4 F (36.9 C)  99 F (37.2 C)  TempSrc: Oral Oral  Oral  SpO2: 100% 100%  100%  Weight:   88.9 kg (196 lb)   Height:   6' (1.829 m)     Intake/Output Summary (Last 24 hours) at 01/13/2018 1726 Last data filed at 01/13/2018 0700 Gross per 24 hour  Intake 1903.75 ml  Output -  Net 1903.75 ml   Filed Weights   01/13/18 1301  Weight: 88.9 kg (196 lb)    Examination:  General exam: Appears calm and comfortable  Respiratory system: Clear to auscultation. Cardiovascular system: S1 & S2  Gastrointestinal system: Abdomen is nondistended, soft and nontender. No organomegaly or masses felt. Normal bowel sounds heard. Central nervous system: Alert and oriented. No focal neurological deficits. Extremities: Left foot is wrapped with gauze.    Data Reviewed: I have personally reviewed following labs and imaging studies  CBC: Recent Labs  Lab 01/12/18 1550 01/13/18 0626  WBC 18.2* 14.8*  NEUTROABS 14.3*  --   HGB 10.6* 9.7*  HCT 31.2* 28.6*  MCV 88.1 88.0  PLT 316 829   Basic Metabolic Panel: Recent Labs  Lab 01/12/18 1550 01/13/18 0626  NA 131* 135  K 3.4* 3.7  CL 98* 104  CO2 22 22  GLUCOSE 64* 136*  BUN 20 18  CREATININE 1.38* 1.23  CALCIUM 8.0* 7.7*   GFR: Estimated Creatinine Clearance: 62.2 mL/min (by C-G formula based on SCr  of 1.23 mg/dL). Liver Function Tests: Recent Labs  Lab 01/12/18 1550  AST 38  ALT 26  ALKPHOS 71  BILITOT 1.6*  PROT 6.4*  ALBUMIN 2.3*   No results for input(s): LIPASE, AMYLASE in the last 168 hours. No results for input(s): AMMONIA in the last 168 hours. Coagulation Profile: No results for input(s): INR, PROTIME in the last 168 hours. Cardiac Enzymes: No results for input(s): CKTOTAL, CKMB, CKMBINDEX, TROPONINI in the last 168 hours. BNP (last 3 results) No results for input(s): PROBNP in the last 8760 hours. HbA1C: No results for input(s): HGBA1C in the last 72 hours. CBG: Recent Labs  Lab 01/12/18 1735 01/12/18 1828 01/12/18 2057 01/13/18 0658 01/13/18 1526  GLUCAP 44* 128* 140* 129* 205*   Lipid Profile: No results for input(s): CHOL, HDL, LDLCALC, TRIG, CHOLHDL, LDLDIRECT in the last 72 hours. Thyroid Function Tests: No results for input(s): TSH, T4TOTAL, FREET4, T3FREE, THYROIDAB in the last 72 hours. Anemia Panel: No results for input(s): VITAMINB12, FOLATE, FERRITIN, TIBC, IRON, RETICCTPCT in the last 72 hours. Urine analysis:    Component Value Date/Time   COLORURINE YELLOW 11/25/2014 1339   APPEARANCEUR CLEAR 11/25/2014 1339   LABSPEC 1.020 11/25/2014 1339  PHURINE 5.5 11/25/2014 1339   GLUCOSEU >=1000 (A) 11/25/2014 1339   HGBUR TRACE-LYSED (A) 11/25/2014 1339   HGBUR negative 08/03/2010 0919   BILIRUBINUR NEGATIVE 11/25/2014 1339   KETONESUR NEGATIVE 11/25/2014 1339   PROTEINUR NEGATIVE 10/18/2012 1358   UROBILINOGEN 0.2 11/25/2014 1339   NITRITE NEGATIVE 11/25/2014 1339   LEUKOCYTESUR NEGATIVE 11/25/2014 1339   Sepsis Labs: @LABRCNTIP (procalcitonin:4,lacticidven:4)  ) Recent Results (from the past 240 hour(s))  Blood Culture (routine x 2)     Status: None (Preliminary result)   Collection Time: 01/12/18  3:50 PM  Result Value Ref Range Status   Specimen Description BLOOD RIGHT ANTECUBITAL  Final   Special Requests   Final    BOTTLES  DRAWN AEROBIC AND ANAEROBIC Blood Culture adequate volume   Culture   Final    NO GROWTH < 24 HOURS Performed at [facility], [place] [address], [place], [place] [address]    Report Status PENDING  Incomplete  Blood Culture (routine x 2)     Status: None (Preliminary result)   Collection Time: 01/12/18  4:00 PM  Result Value Ref Range Status   Specimen Description BLOOD LEFT ARM  Final   Special Requests   Final    BOTTLES DRAWN AEROBIC AND ANAEROBIC Blood Culture adequate volume   Culture   Final    NO GROWTH < 24 HOURS Performed at [facility], [place] [address] [place], [place] [address]    Report Status PENDING  Incomplete         Radiology Studies: Mr Foot Left W Wo Contrast  Result Date: 01/13/2018 CLINICAL DATA:  A skin ulceration and soft tissue gas are seen over the dorsum of the foot at and proximal to the great toe. No radiopaque foreign body. EXAM: MRI OF THE LEFT FOREFOOT WITHOUT AND WITH CONTRAST TECHNIQUE: Multiplanar, multisequence MR imaging of the left forefoot was performed both before and after administration of intravenous contrast. CONTRAST:  55mL MULTIHANCE GADOBENATE DIMEGLUMINE 529 MG/ML IV SOLN COMPARISON:  None. FINDINGS: Bones/Joint/Cartilage Soft tissue wound along the plantar and dorsal medial aspect of the forefoot at the level of the MTP joint. Wound extends deep to the first MTP joint capsule. Small first MTP joint effusion with synovitis and marrow edema at the base of the first proximal phalanx and to a lesser extent first metatarsal head concerning for septic arthritis. 9 x 22 mm fluid collection along the plantar aspect of the first proximal phalanx extending into the medial soft tissues most concerning for an abscess. Large areas of hypoenhancing soft tissue along the dorsal medial aspect of the forefoot with multiple tiny foci of low signal concerning for extension of an abscess intermixed with necrotic soft tissue. No acute fracture or  dislocation. Minimal marrow edema in medial hallux sesamoid and adjacent first metatarsal head likely reflecting reactive marrow changes. Normal alignment. Ligaments Collateral ligaments are intact.  Lisfranc ligament is intact. Muscles and Tendons Flexor, peroneal and extensor compartment tendons are intact. Soft tissue No other fluid collection or hematoma.  No soft tissue mass. IMPRESSION: 1. Soft tissue wound along the plantar and dorsal medial aspect of the forefoot at the level of the MTP joint with surrounding cellulitis. Wound extends deep to the first MTP joint capsule. Septic arthritis of the first MTP joint with adjacent marrow edema. 9 x 22 mm fluid collection along the plantar aspect of the first proximal phalanx extending into the medial and lateral soft tissues most concerning for an abscess. Large  areas of hypoenhancing soft tissue along the dorsal medial aspect of the forefoot extending into the first web space with multiple tiny foci of low signal concerning for extension of an abscess intermixed with necrotic soft tissue concerning for a necrotizing infection. Electronically Signed   By: Kathreen Devoid   On: 01/13/2018 13:28   Dg Chest Port 1 View  Result Date: 01/12/2018 CLINICAL DATA:  Sepsis EXAM: PORTABLE CHEST 1 VIEW COMPARISON:  None. FINDINGS: Normal mediastinum and cardiac silhouette. Normal pulmonary vasculature. No evidence of effusion, infiltrate, or pneumothorax. No acute bony abnormality. IMPRESSION: No acute cardiopulmonary process. Electronically Signed   By: Suzy Bouchard M.D.   On: 01/12/2018 20:06   Dg Foot Complete Left  Result Date: 01/12/2018 CLINICAL DATA:  Soft tissue infection of the left foot for 1 day in a diabetic patient. Initial encounter. EXAM: LEFT FOOT - COMPLETE 3+ VIEW COMPARISON:  None. FINDINGS: A skin ulceration and soft tissue gas are seen over the dorsum of the foot at and proximal to the great toe. No radiopaque foreign body is identified. There is  no bony destructive change or periosteal reaction. 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 IMPRESSION: Findings consistent with cellulitis centered about the right great toe and first metatarsal. No plain film evidence of osteomyelitis. Moderate right first MTP osteoarthritis. Electronically Signed   By: Inge Rise M.D.   On: 01/12/2018 16:41        Scheduled Meds: . aspirin  81 mg Oral Daily  . carvedilol  3.125 mg Oral BID WC  . docusate sodium  100 mg Oral BID  . feeding supplement (PRO-STAT SUGAR FREE 64)  30 mL Oral BID  . heparin  5,000 Units Subcutaneous Q8H  . insulin aspart  0-5 Units Subcutaneous QHS  . insulin aspart  0-9 Units Subcutaneous TID WC  . pantoprazole  40 mg Oral Daily  . rosuvastatin  40 mg Oral QHS  . ticagrelor  90 mg Oral BID   Continuous Infusions: . lactated ringers 75 mL/hr at 01/13/18 0217  . piperacillin-tazobactam (ZOSYN)  IV Stopped (01/13/18 1342)  . vancomycin 1,500 mg (01/13/18 1552)     LOS: 1 day    Time spent: Greater than 35 minutes    Dana Allan, MD  [facility] Pager #: [phone] 7PM-7AM contact night coverage as above   Drug

## 2018-01-14 ENCOUNTER — Encounter (HOSPITAL_COMMUNITY): Payer: Medicare Other

## 2018-01-14 ENCOUNTER — Inpatient Hospital Stay (HOSPITAL_COMMUNITY): Payer: Medicare Other | Admitting: Anesthesiology

## 2018-01-14 ENCOUNTER — Encounter (HOSPITAL_COMMUNITY)
Admission: EM | Disposition: A | Discharge status: Rehabilitation Facility | Payer: Self-pay | Source: Home / Self Care | Attending: Internal Medicine

## 2018-01-14 DIAGNOSIS — L02612 Cutaneous abscess of left foot: Secondary | ICD-10-CM

## 2018-01-14 DIAGNOSIS — A419 Sepsis, unspecified organism: Principal | ICD-10-CM

## 2018-01-14 DIAGNOSIS — L0390 Cellulitis, unspecified: Secondary | ICD-10-CM

## 2018-01-14 DIAGNOSIS — M86272 Subacute osteomyelitis, left ankle and foot: Secondary | ICD-10-CM

## 2018-01-14 HISTORY — PX: AMPUTATION: SHX166

## 2018-01-14 LAB — GLUCOSE, CAPILLARY
GLUCOSE-CAPILLARY: 260 mg/dL — AB (ref 65–99)
GLUCOSE-CAPILLARY: 210 mg/dL — AB (ref 65–99)
Glucose-Capillary: 263 mg/dL — ABNORMAL HIGH (ref 65–99)
Glucose-Capillary: 152 mg/dL — ABNORMAL HIGH (ref 65–99)
Glucose-Capillary: 166 mg/dL — ABNORMAL HIGH (ref 65–99)
Glucose-Capillary: 310 mg/dL — ABNORMAL HIGH (ref 65–99)
Glucose-Capillary: 298 mg/dL — ABNORMAL HIGH (ref 65–99)

## 2018-01-14 LAB — HIV ANTIBODY (ROUTINE TESTING W REFLEX): HIV SCREEN 4TH GENERATION: NONREACTIVE

## 2018-01-14 SURGERY — AMPUTATION, FOOT, RAY
Anesthesia: Monitor Anesthesia Care | Site: Foot | Laterality: Left

## 2018-01-14 MED ORDER — POVIDONE-IODINE 10 % EX SWAB: 2.0000 "application " | Freq: Once | CUTANEOUS | Status: DC

## 2018-01-14 MED ORDER — CHLORHEXIDINE GLUCONATE 4 % EX LIQD
60.0000 mL | Freq: Once | CUTANEOUS | Status: AC
  Administered 2018-01-14: 4 via TOPICAL

## 2018-01-14 MED ORDER — BISACODYL 10 MG RE SUPP: 10.0000 mg | Freq: Every day | RECTAL | Status: DC | PRN

## 2018-01-14 MED ORDER — MORPHINE SULFATE (PF) 2 MG/ML IV SOLN: 0.5000 mg | INTRAVENOUS | Status: DC | PRN

## 2018-01-14 MED ORDER — MAGNESIUM CITRATE PO SOLN: 1.0000 | Freq: Once | ORAL | Status: DC | PRN

## 2018-01-14 MED ORDER — METHOCARBAMOL 500 MG PO TABS: 500.0000 mg | ORAL_TABLET | Freq: Four times a day (QID) | ORAL | Status: DC | PRN

## 2018-01-14 MED ORDER — HYDROCODONE-ACETAMINOPHEN 5-325 MG PO TABS
1.0000 | ORAL_TABLET | ORAL | Status: DC | PRN
  Administered 2018-01-18: 1 via ORAL
  Filled 2018-01-14: qty 1

## 2018-01-14 MED ORDER — FENTANYL CITRATE (PF) 100 MCG/2ML IJ SOLN: 25.0000 ug | INTRAMUSCULAR | Status: DC | PRN

## 2018-01-14 MED ORDER — ACETAMINOPHEN 325 MG PO TABS
325.0000 mg | ORAL_TABLET | Freq: Four times a day (QID) | ORAL | Status: DC | PRN
  Administered 2018-01-14: 650 mg via ORAL
  Filled 2018-01-14: qty 2

## 2018-01-14 MED ORDER — LACTATED RINGERS IV SOLN
INTRAVENOUS | Status: DC | PRN
  Administered 2018-01-14: 10:00:00 via INTRAVENOUS

## 2018-01-14 MED ORDER — 0.9 % SODIUM CHLORIDE (POUR BTL) OPTIME
TOPICAL | Status: DC | PRN
  Administered 2018-01-14: 1000 mL

## 2018-01-14 MED ORDER — ONDANSETRON HCL 4 MG PO TABS: 4.0000 mg | ORAL_TABLET | Freq: Four times a day (QID) | ORAL | Status: DC | PRN

## 2018-01-14 MED ORDER — MIDAZOLAM HCL 2 MG/2ML IJ SOLN
INTRAMUSCULAR | Status: AC
  Filled 2018-01-14: qty 2

## 2018-01-14 MED ORDER — ONDANSETRON HCL 4 MG/2ML IJ SOLN
INTRAMUSCULAR | Status: DC | PRN
  Administered 2018-01-14: 4 mg via INTRAVENOUS

## 2018-01-14 MED ORDER — DOCUSATE SODIUM 100 MG PO CAPS
100.0000 mg | ORAL_CAPSULE | Freq: Two times a day (BID) | ORAL | Status: DC
  Administered 2018-01-15 – 2018-01-18 (×4): 100 mg via ORAL
  Filled 2018-01-14 (×11): qty 1

## 2018-01-14 MED ORDER — METOCLOPRAMIDE HCL 5 MG/ML IJ SOLN: 5.0000 mg | Freq: Three times a day (TID) | INTRAMUSCULAR | Status: DC | PRN

## 2018-01-14 MED ORDER — POLYETHYLENE GLYCOL 3350 17 G PO PACK: 17.0000 g | PACK | Freq: Every day | ORAL | Status: DC | PRN

## 2018-01-14 MED ORDER — METHOCARBAMOL 1000 MG/10ML IJ SOLN
500.0000 mg | Freq: Four times a day (QID) | INTRAVENOUS | Status: DC | PRN
  Filled 2018-01-14: qty 5

## 2018-01-14 MED ORDER — HYDROCODONE-ACETAMINOPHEN 7.5-325 MG PO TABS: 1.0000 | ORAL_TABLET | ORAL | Status: DC | PRN

## 2018-01-14 MED ORDER — PROPOFOL 10 MG/ML IV BOLUS
INTRAVENOUS | Status: AC
  Filled 2018-01-14: qty 20

## 2018-01-14 MED ORDER — FENTANYL CITRATE (PF) 250 MCG/5ML IJ SOLN
INTRAMUSCULAR | Status: DC | PRN
  Administered 2018-01-14 (×2): 50 ug via INTRAVENOUS

## 2018-01-14 MED ORDER — ONDANSETRON HCL 4 MG/2ML IJ SOLN: 4.0000 mg | Freq: Four times a day (QID) | INTRAMUSCULAR | Status: DC | PRN

## 2018-01-14 MED ORDER — SODIUM CHLORIDE 0.9 % IV SOLN
INTRAVENOUS | Status: DC
  Administered 2018-01-16: via INTRAVENOUS

## 2018-01-14 MED ORDER — BUPIVACAINE-EPINEPHRINE (PF) 0.5% -1:200000 IJ SOLN
INTRAMUSCULAR | Status: DC | PRN
  Administered 2018-01-14: 30 mL via PERINEURAL

## 2018-01-14 MED ORDER — VANCOMYCIN HCL IN DEXTROSE 1-5 GM/200ML-% IV SOLN
1000.0000 mg | Freq: Two times a day (BID) | INTRAVENOUS | Status: DC
  Administered 2018-01-14 – 2018-01-20 (×12): 1000 mg via INTRAVENOUS
  Filled 2018-01-14 (×13): qty 200

## 2018-01-14 MED ORDER — METOCLOPRAMIDE HCL 5 MG PO TABS: 5.0000 mg | ORAL_TABLET | Freq: Three times a day (TID) | ORAL | Status: DC | PRN

## 2018-01-14 MED ORDER — PROPOFOL 500 MG/50ML IV EMUL
INTRAVENOUS | Status: DC | PRN
  Administered 2018-01-14: 100 ug/kg/min via INTRAVENOUS

## 2018-01-14 MED ORDER — FENTANYL CITRATE (PF) 250 MCG/5ML IJ SOLN
INTRAMUSCULAR | Status: AC
  Filled 2018-01-14: qty 5

## 2018-01-14 MED ORDER — PROPOFOL 10 MG/ML IV BOLUS
INTRAVENOUS | Status: DC | PRN
  Administered 2018-01-14: 30 mg via INTRAVENOUS
  Administered 2018-01-14: 20 mg via INTRAVENOUS

## 2018-01-14 MED ORDER — MIDAZOLAM HCL 2 MG/2ML IJ SOLN
INTRAMUSCULAR | Status: DC | PRN
  Administered 2018-01-14: 2 mg via INTRAVENOUS

## 2018-01-14 SURGICAL SUPPLY — 32 items
BENZOIN TINCTURE PRP APPL 2/3 (GAUZE/BANDAGES/DRESSINGS) ×2 IMPLANT
BLADE SAW SGTL MED 73X18.5 STR (BLADE) ×2 IMPLANT
BLADE SURG 21 STRL SS (BLADE) ×2 IMPLANT
BNDG COHESIVE 4X5 TAN STRL (GAUZE/BANDAGES/DRESSINGS) IMPLANT
BNDG GAUZE ELAST 4 BULKY (GAUZE/BANDAGES/DRESSINGS) IMPLANT
COVER SURGICAL LIGHT HANDLE (MISCELLANEOUS) ×2 IMPLANT
DRAPE U-SHAPE 47X51 STRL (DRAPES) ×2 IMPLANT
DRESSING PREVENA PLUS CUSTOM (GAUZE/BANDAGES/DRESSINGS) ×1 IMPLANT
DRSG ADAPTIC 3X8 NADH LF (GAUZE/BANDAGES/DRESSINGS) IMPLANT
DRSG PAD ABDOMINAL 8X10 ST (GAUZE/BANDAGES/DRESSINGS) IMPLANT
DRSG PREVENA PLUS CUSTOM (GAUZE/BANDAGES/DRESSINGS) ×2
DURAPREP 26ML APPLICATOR (WOUND CARE) ×2 IMPLANT
ELECT REM PT RETURN 9FT ADLT (ELECTROSURGICAL) ×2
ELECTRODE REM PT RTRN 9FT ADLT (ELECTROSURGICAL) ×1 IMPLANT
GAUZE SPONGE 4X4 12PLY STRL (GAUZE/BANDAGES/DRESSINGS) IMPLANT
GLOVE BIOGEL PI IND STRL 9 (GLOVE) ×1 IMPLANT
GLOVE BIOGEL PI INDICATOR 9 (GLOVE) ×1
GLOVE SURG ORTHO 9.0 STRL STRW (GLOVE) ×2 IMPLANT
GOWN STRL REUS W/ TWL XL LVL3 (GOWN DISPOSABLE) ×2 IMPLANT
GOWN STRL REUS W/TWL XL LVL3 (GOWN DISPOSABLE) ×2
KIT BASIN OR (CUSTOM PROCEDURE TRAY) ×2 IMPLANT
KIT TURNOVER KIT B (KITS) ×2 IMPLANT
NS IRRIG 1000ML POUR BTL (IV SOLUTION) ×2 IMPLANT
PACK ORTHO EXTREMITY (CUSTOM PROCEDURE TRAY) ×2 IMPLANT
PAD ARMBOARD 7.5X6 YLW CONV (MISCELLANEOUS) ×4 IMPLANT
STOCKINETTE IMPERVIOUS LG (DRAPES) IMPLANT
SUT ETHILON 2 0 PSLX (SUTURE) ×4 IMPLANT
SWAB COLLECTION DEVICE MRSA (MISCELLANEOUS) ×2 IMPLANT
SWAB CULTURE ESWAB REG 1ML (MISCELLANEOUS) ×2 IMPLANT
TOWEL OR 17X26 10 PK STRL BLUE (TOWEL DISPOSABLE) ×2 IMPLANT
TUBE CONNECTING 12X1/4 (SUCTIONS) ×2 IMPLANT
YANKAUER SUCT BULB TIP NO VENT (SUCTIONS) ×2 IMPLANT

## 2018-01-14 NOTE — Consult Note (Signed)
ORTHOPAEDIC CONSULTATION  REQUESTING PHYSICIAN: Robbie Lis, MD  Chief Complaint: Abscess purulent drainage left forefoot.  HPI: David Lane is a 72 y.o. male who presents with abscess left forefoot.  Patient is status post cardiac catheterization with placement of a stent.  He is currently on Brilinta.  Past Medical History:  Diagnosis Date  . CAD (coronary artery disease)    last cath by [facility] DR.  Mihai Croitoru showing  some  disease involving LCX and small size of Diag   . CHF exacerbation, due to diastolic dysfunction 40/98/1191  . Chronic systolic CHF (congestive heart failure), NYHA class 1 ([place]) 07/17/2012  . GERD (gastroesophageal reflux disease)   . Hepatic lesion 02/04/11  . Hyperlipemia   . Hypertension   . Ischemic cardiomyopathy    EF 35-40%  . Liver hemangioma   . NSTEMI (non-ST elevated myocardial infarction) ([place]) 11/21/2009  . Pancreatitis 2000's  . Shortness of breath   . Type II diabetes mellitus ([place])    Past Surgical History:  Procedure Laterality Date  . CARDIAC CATHETERIZATION  2011   minimal disease, medical management  . CORONARY ANGIOPLASTY WITH STENT PLACEMENT  07/14/2012   successful PCI & stenting of mid LAD & PDA off the dominant CX  . CORONARY/GRAFT ACUTE MI REVASCULARIZATION N/A 12/20/2017   Procedure: Coronary/Graft Acute MI Revascularization;  Surgeon: Martinique, Peter M, MD;  Location: [place] [facility];  Service: Cardiovascular;  Laterality: N/A;  . INCISION AND DRAINAGE ABSCESS ANAL  1970's  . LEFT HEART CATH AND CORONARY ANGIOGRAPHY N/A 12/20/2017   Procedure: LEFT HEART CATH AND CORONARY ANGIOGRAPHY;  Surgeon: Martinique, Peter M, MD;  Location: [facility];  Service: Cardiovascular;  Laterality: N/A;  . LEFT HEART CATHETERIZATION WITH CORONARY ANGIOGRAM N/A 07/14/2012   Procedure: LEFT HEART CATHETERIZATION WITH CORONARY ANGIOGRAM;  Surgeon: [name], MD;  Location: [facility];  Service: Cardiovascular;  Laterality: N/A;    . PERCUTANEOUS CORONARY STENT INTERVENTION (PCI-S) Right 07/14/2012   Procedure: PERCUTANEOUS CORONARY STENT INTERVENTION (PCI-S);  Surgeon: [name], MD;  Location: [facility] CATH LAB;  Service: Cardiovascular;  Laterality: Right;   Social History   Socioeconomic History  . Marital status: Single    Spouse name: Not on file  . Number of children: 0  . Years of education: Not on file  . Highest education level: Not on file  Occupational History  . Occupation: retired    Fish farm manager: [employer]  Social Needs  . Financial resource strain: Not on file  . Food insecurity:    Worry: Not on file    Inability: Not on file  . Transportation needs:    Medical: Not on file    Non-medical: Not on file  Tobacco Use  . Smoking status: Never Smoker  . Smokeless tobacco: Never Used  Substance and Sexual Activity  . Alcohol use: No  . Drug use: No  . Sexual activity: Never  Lifestyle  . Physical activity:    Days per week: Not on file    Minutes per session: Not on file  . Stress: Not on file  Relationships  . Social connections:    Talks on phone: Not on file    Gets together: Not on file    Attends religious service: Not on file    Active member of club or organization: Not on file    Attends meetings of clubs or organizations: Not on file    Relationship status: Not on file  Other  Topics Concern  . Not on file  Social History Narrative  . Not on file   Family History  Problem Relation Age of Onset  . Diabetes Mother   . Diabetes Paternal Grandmother   . Diabetes Maternal Grandfather   . Colon cancer Neg Hx    - negative except otherwise stated in the family history section Allergies  Allergen Reactions  . Lipitor [Atorvastatin] Other (See Comments)    Muscle weakness  . Metformin And Related Diarrhea    Severe diarrhea   Prior to Admission medications   Medication Sig Start Date End Date Taking? Authorizing Provider  aspirin 81 MG tablet Take 81 mg by mouth daily.      Yes [provider]  carvedilol (COREG) 3.125 MG tablet Take 1 tablet (3.125 mg total) by mouth 2 (two) times daily with a meal. 12/25/17  Yes Duke, Tami Lin, PA  Insulin Glargine (LANTUS SOLOSTAR) 100 UNIT/ML Solostar Pen Inject 15 Units into the skin daily at 10 pm. NEED APPOINTMENT Patient taking differently: Inject 80 Units into the skin daily before breakfast.  12/25/17  Yes Duke, Tami Lin, PA  lisinopril (PRINIVIL,ZESTRIL) 20 MG tablet Take 0.5 tablets (10 mg total) by mouth daily. Patient taking differently: Take 10 mg by mouth 2 (two) times daily.  12/25/17  Yes Duke, Tami Lin, PA  pantoprazole (PROTONIX) 40 MG tablet Take 1 tablet (40 mg total) by mouth daily. 12/25/17  Yes Duke, Tami Lin, PA  rosuvastatin (CRESTOR) 40 MG tablet Take 1 tablet (40 mg total) by mouth at bedtime. 12/23/17  Yes Kathi Ludwig, MD  ticagrelor (BRILINTA) 90 MG TABS tablet Take 1 tablet (90 mg total) by mouth 2 (two) times daily. 12/25/17  Yes Duke, Tami Lin, PA  glucose blood (ACCU-CHEK AVIVA) test strip 4 (four) times daily. DX code e11.9 12/25/17   Daune Perch, NP  insulin aspart (NOVOLOG) 100 UNIT/ML injection Inject 5 Units into the skin 3 (three) times daily with meals. 12/25/17   Duke, Tami Lin, PA  Insulin Pen Needle (B-D UF III MINI PEN NEEDLES) 31G X 5 MM MISC use to inject insulin daily 12/25/17   Daune Perch, NP   Mr Foot Left W Wo Contrast  Result Date: 01/13/2018 CLINICAL DATA:  A skin ulceration and soft tissue gas are seen over the dorsum of the foot at and proximal to the great toe. No radiopaque foreign body. EXAM: MRI OF THE LEFT FOREFOOT WITHOUT AND WITH CONTRAST TECHNIQUE: Multiplanar, multisequence MR imaging of the left forefoot was performed both before and after administration of intravenous contrast. CONTRAST:  37mL MULTIHANCE GADOBENATE DIMEGLUMINE 529 MG/ML IV SOLN COMPARISON:  None. FINDINGS: Bones/Joint/Cartilage Soft tissue wound along the  plantar and dorsal medial aspect of the forefoot at the level of the MTP joint. Wound extends deep to the first MTP joint capsule. Small first MTP joint effusion with synovitis and marrow edema at the base of the first proximal phalanx and to a lesser extent first metatarsal head concerning for septic arthritis. 9 x 22 mm fluid collection along the plantar aspect of the first proximal phalanx extending into the medial soft tissues most concerning for an abscess. Large areas of hypoenhancing soft tissue along the dorsal medial aspect of the forefoot with multiple tiny foci of low signal concerning for extension of an abscess intermixed with necrotic soft tissue. No acute fracture or dislocation. Minimal marrow edema in medial hallux sesamoid and adjacent first metatarsal head likely reflecting reactive marrow changes. Normal alignment. Ligaments  Collateral ligaments are intact.  Lisfranc ligament is intact. Muscles and Tendons Flexor, peroneal and extensor compartment tendons are intact. Soft tissue No other fluid collection or hematoma.  No soft tissue mass. IMPRESSION: 1. Soft tissue wound along the plantar and dorsal medial aspect of the forefoot at the level of the MTP joint with surrounding cellulitis. Wound extends deep to the first MTP joint capsule. Septic arthritis of the first MTP joint with adjacent marrow edema. 9 x 22 mm fluid collection along the plantar aspect of the first proximal phalanx extending into the medial and lateral soft tissues most concerning for an abscess. Large areas of hypoenhancing soft tissue along the dorsal medial aspect of the forefoot extending into the first web space with multiple tiny foci of low signal concerning for extension of an abscess intermixed with necrotic soft tissue concerning for a necrotizing infection. Electronically Signed   By: Kathreen Devoid   On: 01/13/2018 13:28   Dg Chest Port 1 View  Result Date: 01/12/2018 CLINICAL DATA:  Sepsis EXAM: PORTABLE CHEST 1  VIEW COMPARISON:  None. FINDINGS: Normal mediastinum and cardiac silhouette. Normal pulmonary vasculature. No evidence of effusion, infiltrate, or pneumothorax. No acute bony abnormality. IMPRESSION: No acute cardiopulmonary process. Electronically Signed   By: Suzy Bouchard M.D.   On: 01/12/2018 20:06   Dg Foot Complete Left  Result Date: 01/12/2018 CLINICAL DATA:  Soft tissue infection of the left foot for 1 day in a diabetic patient. Initial encounter. EXAM: LEFT FOOT - COMPLETE 3+ VIEW COMPARISON:  None. FINDINGS: A skin ulceration and soft tissue gas are seen over the dorsum of the foot at and proximal to the great toe. No radiopaque foreign body is identified. There is no bony destructive change or periosteal reaction. 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 IMPRESSION: Findings consistent with cellulitis centered about the right great toe and first metatarsal. No plain film evidence of osteomyelitis. Moderate right first MTP osteoarthritis. Electronically Signed   By: Inge Rise M.D.   On: 01/12/2018 16:41   - pertinent xrays, CT, MRI studies were reviewed and independently interpreted  Positive ROS: All other systems have been reviewed and were otherwise negative with the exception of those mentioned in the HPI and as above.  Physical Exam: General: Alert, no acute distress Psychiatric: Patient is competent for consent with normal mood and affect Lymphatic: No axillary or cervical lymphadenopathy Cardiovascular: No pedal edema Respiratory: No cyanosis, no use of accessory musculature GI: No organomegaly, abdomen is soft and non-tender  Skin: Patient has a ulcer 2 cm in diameter proximal to the MTP joint of the left great toe.  There is ischemic ulceration more proximally approximately 4 cm in diameter.  There is purulent drainage. Images:  @ENCIMAGES @   Neurologic: Patient does not have protective sensation bilateral lower extremities.   MUSCULOSKELETAL:    Examination patient has a good dorsalis pedis and posterior tibial pulse he also has good pulses on the right foot.  There are no ulcerations on the right foot or leg.  Left foot has ulceration which extends down to the MTP joint with ischemic ulcer more proximally.  There is a plantar ulcer beneath the MTP joint as well.  There are no venous stasis changes.  Assessment: Assessment: Diabetic insensate neuropathy with abscess ulceration and ischemic skin changes over the left forefoot.  Plan: Plan: Feel the patient's best option to proceed with a transmetatarsal amputation.  I am unsure how proximal the abscess extends but ideally we  can save a transmetatarsal amputation.  We will plan for a wound VAC postoperatively.  He will need to be nonweightbearing postoperatively.  Risks and benefits were discussed including potential need for a higher level amputation.  Thank you for the consult and the opportunity to see David Lane, [place] [phone] 9:04 AM

## 2018-01-14 NOTE — Anesthesia Preprocedure Evaluation (Signed)
Anesthesia Evaluation  Patient identified by MRN, date of birth, ID band Patient awake    Reviewed: Allergy & Precautions, NPO status , Patient's Chart, lab work & pertinent test results  History of Anesthesia Complications Negative for: history of anesthetic complications  Airway Mallampati: II  TM Distance: >3 FB Neck ROM: Full    Dental  (+) Teeth Intact   Pulmonary shortness of breath,    breath sounds clear to auscultation       Cardiovascular hypertension, Pt. on medications and Pt. on home beta blockers + CAD, + Past MI and +CHF   Rhythm:Regular     Neuro/Psych PSYCHIATRIC DISORDERS Anxiety negative neurological ROS     GI/Hepatic Neg liver ROS, GERD  Medicated and Controlled,  Endo/Other  diabetes, Type 2, Insulin Dependent  Renal/GU Renal InsufficiencyRenal disease     Musculoskeletal   Abdominal   Peds  Hematology  (+) anemia ,   Anesthesia Other Findings   Reproductive/Obstetrics                             Anesthesia Physical Anesthesia Plan  ASA: III  Anesthesia Plan: MAC and Regional   Post-op Pain Management:    Induction: Intravenous  PONV Risk Score and Plan: 1 and Ondansetron  Airway Management Planned: Nasal Cannula  Additional Equipment: None  Intra-op Plan:   Post-operative Plan:   Informed Consent: I have reviewed the patients History and Physical, chart, labs and discussed the procedure including the risks, benefits and alternatives for the proposed anesthesia with the patient or authorized representative who has indicated his/her understanding and acceptance.   Dental advisory given  Plan Discussed with: Surgeon and CRNA  Anesthesia Plan Comments:         Anesthesia Quick Evaluation

## 2018-01-14 NOTE — Anesthesia Postprocedure Evaluation (Signed)
Anesthesia Post Note  Patient: David Lane  Procedure(s) Performed: TRANSMETATARSAL AMPUTATION (Left Foot)     Patient location during evaluation: PACU Anesthesia Type: MAC and Regional Level of consciousness: awake and alert Pain management: pain level controlled Vital Signs Assessment: post-procedure vital signs reviewed and stable Respiratory status: spontaneous breathing, nonlabored ventilation, respiratory function stable and patient connected to nasal cannula oxygen Cardiovascular status: stable and blood pressure returned to baseline Postop Assessment: no apparent nausea or vomiting Anesthetic complications: no    Last Vitals:  Vitals:   01/14/18 1225 01/14/18 1318  BP: 105/63 110/68  Pulse: 66 70  Resp: 20 18  Temp: 36.5 C 36.4 C  SpO2: 99% 100%    Last Pain:  Vitals:   01/14/18 1318  TempSrc: Oral  PainSc:                  Kataleia Quaranta

## 2018-01-14 NOTE — Plan of Care (Signed)
Pt able to verbalize plan of care. Pt denies pain.

## 2018-01-14 NOTE — Anesthesia Procedure Notes (Signed)
Procedure Name: MAC Date/Time: 01/14/2018 9:56 AM Performed by: Imagene Riches, CRNA Pre-anesthesia Checklist: Patient identified, Emergency Drugs available, Suction available and Patient being monitored Patient Re-evaluated:Patient Re-evaluated prior to induction Oxygen Delivery Method: Simple face mask Preoxygenation: Pre-oxygenation with 100% oxygen Induction Type: IV induction

## 2018-01-14 NOTE — Transfer of Care (Signed)
Immediate Anesthesia Transfer of Care Note  Patient: David Lane  Procedure(s) Performed: TRANSMETATARSAL AMPUTATION (Left Foot)  Patient Location: PACU  Anesthesia Type:General  Level of Consciousness: awake, alert  and oriented  Airway & Oxygen Therapy: Patient Spontanous Breathing  Post-op Assessment: Report given to RN and Post -op Vital signs reviewed and stable  Post vital signs: Reviewed and stable  Last Vitals:  Vitals Value Taken Time  BP 112/68 01/14/2018 10:21 AM  Temp    Pulse 78 01/14/2018 10:22 AM  Resp 14 01/14/2018 10:22 AM  SpO2 100 % 01/14/2018 10:22 AM  Vitals shown include unvalidated device data.  Last Pain:  Vitals:   01/14/18 0844  TempSrc: Oral  PainSc:          Complications: No apparent anesthesia complications

## 2018-01-14 NOTE — Progress Notes (Signed)
Pharmacy Antibiotic Note  David Lane is a 72 y.o. male admitted on 01/12/2018 with cellulitis of his left foot and started on vancomycin and Zosyn. Found to have abscess osteomyelitis of foot, now s/p amputation on 01/14/18.  Pharmacy has been consulted for vancomycin and Zosyn dosing.   Renal function was improving.  Afebrile, WBC 19.9.   Plan: Change vanc to 1gm IV Q12H for new trough goal of 15-20 mcg/mL Zosyn EID 3.375gm IV Q8H Monitor renal fxn, clinical progress, vanc trough prior to 4th dose F/U abx LOT post amputation   Height: 6' (182.9 cm) Weight: 196 lb (88.9 kg) IBW/kg (Calculated) : 77.6  Temp (24hrs), Avg:98.1 F (36.7 C), Min:97.3 F (36.3 C), Max:99 F (37.2 C)  Recent Labs  Lab 01/12/18 1550 01/12/18 1559 01/13/18 0626  WBC 18.2*  --  14.8*  CREATININE 1.38*  --  1.23  LATICACIDVEN  --  1.17  --     Estimated Creatinine Clearance: 62.2 mL/min (by C-G formula based on SCr of 1.23 mg/dL).    Allergies  Allergen Reactions  . Lipitor [Atorvastatin] Other (See Comments)    Muscle weakness  . Metformin And Related Diarrhea    Severe diarrhea    Zosyn 4/18 >> Vanc 4/18 >>  4/18 BCx -  4/19 HIV - negative 4/18 BCx -    Lokelani Lutes D. Mina Marble, PharmD, BCPS, BCCCP Pager:  [phone] 01/14/2018, 11:34 AM

## 2018-01-14 NOTE — Op Note (Addendum)
01/12/2018 - 01/14/2018  10:13 AM  PATIENT:  David Lane    PRE-OPERATIVE DIAGNOSIS:  ABSCESS OSTEOMYELITIS LEFT FOOT  POST-OPERATIVE DIAGNOSIS:  Same  PROCEDURE:  TRANSMETATARSAL AMPUTATION Application Praveena wound VAC.  SURGEON:  Newt Minion, MD  PHYSICIAN ASSISTANT:None ANESTHESIA:   General  PREOPERATIVE INDICATIONS:  David Lane is a  72 y.o. male with a diagnosis of ABSCESS OSTEOMYELITIS LEFT FOOT who failed conservative measures and elected for surgical management.    The risks benefits and alternatives were discussed with the patient preoperatively including but not limited to the risks of infection, bleeding, nerve injury, cardiopulmonary complications, the need for revision surgery, among others, and the patient was willing to proceed.  OPERATIVE IMPLANTS: Praveena wound VAC.  Cultures: Cultures obtained x2.  @ENCIMAGES @  OPERATIVE FINDINGS: Minimal circulation from the dorsalis pedis good circulation on the posterior flap with the medial and lateral plantar arteries.  OPERATIVE PROCEDURE: Patient was brought to the operating room and underwent a regional block.  After adequate levels of anesthesia were obtained patient's left lower extremity was prepped using DuraPrep draped in the sterile field a timeout was called.  A fishmouth incision was made just proximal to the necrotic ulcers.  There was an abscess that extended to the midfoot.  The incision was carried more proximally on the dorsal and the plantar flap had good circulation.  The foot was amputated through the base of the metatarsals.  Electrocautery was used for hemostasis the wound was irrigated with normal saline.  The flap tissue that remained was not in contact with the abscess.  The wound was closed using 2-0 nylon.  A Praveena wound VAC was applied this had a good suction fit.  Patient was taken the PACU in stable condition.   DISCHARGE PLANNING:  Antibiotic duration: Continue antibiotics for  at least 3 days postoperatively.   Weightbearing: Nonweightbearing on the left.  Pain medication: Ordered  Dressing care/ Wound VAC: Continue wound VAC for 1 week postoperatively.  Ambulatory devices: Walker or wheelchair  Discharge to:  skilled nursing. Patient lives alone  Follow-up: In the office 1 week post operative.

## 2018-01-14 NOTE — Anesthesia Procedure Notes (Signed)
Anesthesia Regional Block: Popliteal block   Pre-Anesthetic Checklist: ,, timeout performed, Correct Patient, Correct Site, Correct Laterality, Correct Procedure, Correct Position, site marked, Risks and benefits discussed,  Surgical consent,  Pre-op evaluation,  At surgeon's request and post-op pain management  Laterality: Lower and Left  Prep: chloraprep       Needles:  Injection technique: Single-shot  Needle Type: Echogenic Stimulator Needle          Additional Needles:   Procedures:,,,, ultrasound used (permanent image in chart),,,,  Narrative:  Start time: 01/14/2018 9:27 AM End time: 01/14/2018 9:30 AM Injection made incrementally with aspirations every 5 mL.  Performed by: Personally  Anesthesiologist: Oleta Mouse, MD  Additional Notes: H+P and labs reviewed, risks and benefits discussed with patient, procedure tolerated well without complications

## 2018-01-14 NOTE — Progress Notes (Signed)
Patient ID: David Lane, male   DOB: 04-26-1946, 72 y.o.   MRN: [id number]  PROGRESS NOTE    JHOVANNY GUINTA  QIW:[id number] DOB: [birth year] DOA: 01/12/2018  PCP: Dorena Cookey, MD   Brief Narrative:  72 year old Caucasian male with past medical history significant forCADwith recent STEMI (3/26-31) s/p stenton Brilinta, HTN, HLD; combined CHF(EF 35-40% with grade 1 diastolic dysfunction on 4/17), DM who presented with cellulitisand left foot wound. Started on vanco and zosyn. Seen by Dr. Sharol Given due to concern for necrotizing infection and need for amputation.   Assessment & Plan:   Principal Problem: Sepsis arthritis of the first MTP joint  ([place]) / Diabetic insensate neuropathy with abscess ulceration and ischemic skin changes over the left forefoot - MRI left foot showed soft tissue wound along the plantar and dorsal medial aspect of the forefoot at the level of the MTP joint with surrounding cellulitis. Wound extends deep to the first MTP joint capsule. Septic arthritis of the first MTP joint with adjacent marrow edema. 9 x 22 mm fluid collection along the plantar aspect of the first proximal phalanx extending into the medial and lateral soft tissues most concerning for an abscess and extension of an abscess intermixed with necrotic soft tissue concerning for a necrotizing infection.  - X ray of the left foot showed indings consistent wifth cellulitis centered about the right great toe and first metatarsal. No plain film evidence of osteomyelitis. Moderate right first MTP osteoarthritis - On vanco and zosyn  - Plan for surgery today   Active Problems:   Hyperlipidemia - Continue Crestor    Essential hypertension - Continue carvedilol 3.125 mg 2 times daily     Chronic combined systolic and diastolic CHF (congestive heart failure) ([place]) - Well compensated     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 (HCC) - Continue SSI    Status post coronary artery  stent placement - No chest pain    DVT prophylaxis: Brilinta  Code Status: full code  Family Communication: no family at the bedside Disposition Plan:  Plan for surgery today    Consultants:   Orthopedic surgery   Procedures:  Plan for surgery today   Antimicrobials:   Vanco and zosyn 4/18 -->   Subjective: No overnight events.  Objective: Vitals:   01/13/18 1520 01/13/18 2100 01/14/18 0622 01/14/18 0844  BP: 122/71 (!) 120/94 137/73 110/67  Pulse: 86 98 86 84  Resp:  14 15 20   Temp: 99 F (37.2 C) 98.4 F (36.9 C) 98.6 F (37 C) 98.2 F (36.8 C)  TempSrc: Oral Oral Oral Oral  SpO2: 100% 100%  100%  Weight:      Height:        Intake/Output Summary (Last 24 hours) at 01/14/2018 1022 Last data filed at 01/14/2018 1001 Gross per 24 hour  Intake 1830 ml  Output 50 ml  Net 1780 ml   Filed Weights   01/13/18 1301  Weight: 88.9 kg (196 lb)    Examination:  General exam: Appears calm and comfortable  Respiratory system: Clear to auscultation. Respiratory effort normal. Cardiovascular system: S1 & S2 heard, RRR.  Gastrointestinal system: Abdomen is nondistended, soft and nontender. No organomegaly or masses felt. Normal bowel sounds heard. Central nervous system: Alert and oriented. No focal neurological deficits. Extremities: Symmetric 5 x 5 power. Skin: Dressing in place left foot Psychiatry: Judgement and insight appear normal. Mood & affect appropriate.   Data Reviewed: I have personally reviewed following labs and imaging  studies  CBC: Recent Labs  Lab 01/12/18 1550 01/13/18 0626  WBC 18.2* 14.8*  NEUTROABS 14.3*  --   HGB 10.6* 9.7*  HCT 31.2* 28.6*  MCV 88.1 88.0  PLT 316 237   Basic Metabolic Panel: Recent Labs  Lab 01/12/18 1550 01/13/18 0626  NA 131* 135  K 3.4* 3.7  CL 98* 104  CO2 22 22  GLUCOSE 64* 136*  BUN 20 18  CREATININE 1.38* 1.23  CALCIUM 8.0* 7.7*   GFR: Estimated Creatinine Clearance: 62.2 mL/min (by C-G  formula based on SCr of 1.23 mg/dL). Liver Function Tests: Recent Labs  Lab 01/12/18 1550  AST 38  ALT 26  ALKPHOS 71  BILITOT 1.6*  PROT 6.4*  ALBUMIN 2.3*   No results for input(s): LIPASE, AMYLASE in the last 168 hours. No results for input(s): AMMONIA in the last 168 hours. Coagulation Profile: No results for input(s): INR, PROTIME in the last 168 hours. Cardiac Enzymes: No results for input(s): CKTOTAL, CKMB, CKMBINDEX, TROPONINI in the last 168 hours. BNP (last 3 results) No results for input(s): PROBNP in the last 8760 hours. HbA1C: No results for input(s): HGBA1C in the last 72 hours. CBG: Recent Labs  Lab 01/13/18 1526 01/13/18 2120 01/14/18 0446 01/14/18 0635 01/14/18 0825  GLUCAP 205* 284* 263* 260* 210*   Lipid Profile: No results for input(s): CHOL, HDL, LDLCALC, TRIG, CHOLHDL, LDLDIRECT in the last 72 hours. Thyroid Function Tests: No results for input(s): TSH, T4TOTAL, FREET4, T3FREE, THYROIDAB in the last 72 hours. Anemia Panel: No results for input(s): VITAMINB12, FOLATE, FERRITIN, TIBC, IRON, RETICCTPCT in the last 72 hours. Urine analysis:    Component Value Date/Time   COLORURINE YELLOW 11/25/2014 1339   APPEARANCEUR CLEAR 11/25/2014 1339   LABSPEC 1.020 11/25/2014 1339   PHURINE 5.5 11/25/2014 1339   GLUCOSEU >=1000 (A) 11/25/2014 1339   HGBUR TRACE-LYSED (A) 11/25/2014 1339   HGBUR negative 08/03/2010 0919   BILIRUBINUR NEGATIVE 11/25/2014 1339   KETONESUR NEGATIVE 11/25/2014 1339   PROTEINUR NEGATIVE 10/18/2012 1358   UROBILINOGEN 0.2 11/25/2014 1339   NITRITE NEGATIVE 11/25/2014 1339   LEUKOCYTESUR NEGATIVE 11/25/2014 1339   Sepsis Labs: @LABRCNTIP (procalcitonin:4,lacticidven:4)    Recent Results (from the past 240 hour(s))  Blood Culture (routine x 2)     Status: None (Preliminary result)   Collection Time: 01/12/18  3:50 PM  Result Value Ref Range Status   Specimen Description BLOOD RIGHT ANTECUBITAL  Final   Special Requests    Final    BOTTLES DRAWN AEROBIC AND ANAEROBIC Blood Culture adequate volume   Culture   Final    NO GROWTH < 24 HOURS Performed at [facility], [place] [address] [place], [place] [address]    Report Status PENDING  Incomplete  Blood Culture (routine x 2)     Status: None (Preliminary result)   Collection Time: 01/12/18  4:00 PM  Result Value Ref Range Status   Specimen Description BLOOD LEFT ARM  Final   Special Requests   Final    BOTTLES DRAWN AEROBIC AND ANAEROBIC Blood Culture adequate volume   Culture   Final    NO GROWTH < 24 HOURS Performed at [facility], [place] [address] [place], [place] [address]    Report Status PENDING  Incomplete      Radiology Studies: Mr Foot Left W Wo Contrast Result Date: 01/13/2018 1. Soft tissue wound along the plantar and dorsal medial aspect of the forefoot at the level of the MTP  joint with surrounding cellulitis. Wound extends deep to the first MTP joint capsule. Septic arthritis of the first MTP joint with adjacent marrow edema. 9 x 22 mm fluid collection along the plantar aspect of the first proximal phalanx extending into the medial and lateral soft tissues most concerning for an abscess. Large areas of hypoenhancing soft tissue along the dorsal medial aspect of the forefoot extending into the first web space with multiple tiny foci of low signal concerning for extension of an abscess intermixed with necrotic soft tissue concerning for a necrotizing infection. Electronically Signed   By: Kathreen Devoid   On: 01/13/2018 13:28   Dg Chest Port 1 View Result Date: 01/12/2018 No acute cardiopulmonary process.   Dg Foot Complete Left Result Date: 01/12/2018 Findings consistent with cellulitis centered about the right great toe and first metatarsal. No plain film evidence of osteomyelitis. Moderate right first MTP osteoarthritis.   Scheduled Meds: .  aspirin  81 mg Oral Daily  .  carvedilol  3.125 mg Oral BID WC  .  docusate sodium  100  mg Oral BID  . insulin aspart  0-5 Units Subcutaneous QHS  .  insulin aspart  0-9 Units Subcutaneous TID WC  .  pantoprazole  40 mg Oral Daily  .  rosuvastatin  40 mg Oral QHS  .  ticagrelor  90 mg Oral BID   Continuous Infusions: . lactated ringers 75 mL/hr at 01/14/18 0840  . [MAR Hold] piperacillin-tazobactam (ZOSYN)  IV 3.375 g (01/14/18 0827)  . [MAR Hold] vancomycin Stopped (01/13/18 1756)     LOS: 2 days    Time spent: 25 minutes  Greater than 50% of the time spent on counseling and coordinating the care.   Leisa Lenz, MD [facility] Pager [phone]  If 7PM-7AM, please contact night-coverage [email or web address] Password [facility] 01/14/2018, 10:22 AM

## 2018-01-15 ENCOUNTER — Encounter (HOSPITAL_COMMUNITY): Payer: Self-pay | Admitting: Orthopedic Surgery

## 2018-01-15 LAB — CBC
HEMATOCRIT: 28.5 % — AB (ref 39.0–52.0)
HEMOGLOBIN: 9.3 g/dL — AB (ref 13.0–17.0)
MCH: 29.4 pg (ref 26.0–34.0)
MCHC: 32.6 g/dL (ref 30.0–36.0)
MCV: 90.2 fL (ref 78.0–100.0)
Platelets: 396 10*3/uL (ref 150–400)
RBC: 3.16 MIL/uL — AB (ref 4.22–5.81)
RDW: 13 % (ref 11.5–15.5)
WBC: 10.1 10*3/uL (ref 4.0–10.5)

## 2018-01-15 LAB — GLUCOSE, CAPILLARY
GLUCOSE-CAPILLARY: 331 mg/dL — AB (ref 65–99)
Glucose-Capillary: 274 mg/dL — ABNORMAL HIGH (ref 65–99)
Glucose-Capillary: 294 mg/dL — ABNORMAL HIGH (ref 65–99)
Glucose-Capillary: 321 mg/dL — ABNORMAL HIGH (ref 65–99)

## 2018-01-15 LAB — BASIC METABOLIC PANEL
ANION GAP: 10 (ref 5–15)
BUN: 13 mg/dL (ref 6–20)
CHLORIDE: 101 mmol/L (ref 101–111)
CO2: 23 mmol/L (ref 22–32)
Calcium: 7.9 mg/dL — ABNORMAL LOW (ref 8.9–10.3)
Creatinine, Ser: 1.2 mg/dL (ref 0.61–1.24)
GFR calc Af Amer: >60 mL/min (ref >60)
GFR calc non Af Amer: 60 mL/min — ABNORMAL LOW (ref >60)
Glucose, Bld: 296 mg/dL — ABNORMAL HIGH (ref 65–99)
Potassium: 4.3 mmol/L (ref 3.5–5.1)
Sodium: 134 mmol/L — ABNORMAL LOW (ref 135–145)

## 2018-01-15 NOTE — Evaluation (Signed)
Physical Therapy Evaluation Patient Details Name: David Lane MRN: [id number] DOB: [birth year] Today's Date: 01/15/2018   History of Present Illness  72 year old male with past medical history significant forCADwith recent STEMI (3/26-31) s/p stenton Brilinta, HTN, HLD; combined CHF(EF 35-40% with grade 1 diastolic dysfunction on 7/32), DM who presented with cellulitisand left foot wound.W/up revealed osteomyelitis.  Dr. Sharol Given did transmet amputation on 01/14/18.    Clinical Impression  Patient presents s/p transmet amputation with dependencies in gait and mobility due to generalized weakness, change in body image and decreased balance.  Patient will benefit from PT to progress mobility and improve balance to reach independence in mobility.  Feel patient would benefit from CIR to maximize potential for d/c home.      Follow Up Recommendations CIR    Equipment Recommendations  Rolling walker with 5" wheels    Recommendations for Other Services Rehab consult     Precautions / Restrictions Precautions Precautions: Fall Restrictions LUE Weight Bearing: Non weight bearing      Mobility  Bed Mobility Overal bed mobility: Modified Independent                Transfers Overall transfer level: Needs assistance Equipment used: Rolling walker (2 wheeled) Transfers: Sit to/from Stand Sit to Stand: Min assist         General transfer comment: assist for sequencing and safety  Ambulation/Gait Ambulation/Gait assistance: Mod assist Ambulation Distance (Feet): 2 Feet Assistive device: Rolling walker (2 wheeled)       General Gait Details: just took a couple of steps to the chair, very unsteady and nervous with walker.  Stairs            Wheelchair Mobility    Modified Rankin (Stroke Patients Only)       Balance Overall balance assessment: Needs assistance Sitting-balance support: No upper extremity supported;Feet supported Sitting balance-Leahy Scale:  Good     Standing balance support: Bilateral upper extremity supported Standing balance-Leahy Scale: Poor Standing balance comment: reliant on RW for balance                             Pertinent Vitals/Pain Pain Assessment: No/denies pain    Home Living Family/patient expects to be discharged to:: Private residence Living Arrangements: Alone   Type of Home: House Home Access: Stairs to enter Entrance Stairs-Rails: None Entrance Stairs-Number of Steps: 3 Home Layout: One level Home Equipment: None      Prior Function Level of Independence: Independent               Hand Dominance        Extremity/Trunk Assessment   Upper Extremity Assessment Upper Extremity Assessment: Overall WFL for tasks assessed    Lower Extremity Assessment Lower Extremity Assessment: Generalized weakness    Cervical / Trunk Assessment Cervical / Trunk Assessment: Normal  Communication   Communication: No difficulties  Cognition Arousal/Alertness: Awake/alert Behavior During Therapy: WFL for tasks assessed/performed Overall Cognitive Status: Within Functional Limits for tasks assessed                                        General Comments      Exercises     Assessment/Plan    PT Assessment Patient needs continued PT services  PT Problem List Decreased strength;Decreased activity tolerance;Decreased balance;Decreased knowledge of use of DME;Decreased mobility  PT Treatment Interventions DME instruction;Gait training;Functional mobility training;Balance training;Therapeutic exercise;Therapeutic activities;Patient/family education    PT Goals (Current goals can be found in the Care Plan section)  Acute Rehab PT Goals [name] PT Goal Formulation: With patient Time For Goal Achievement: 01/29/18 Potential to Achieve Goals: Good    Frequency Min 3X/week   Barriers to discharge Decreased caregiver  support      Co-evaluation               AM-PAC PT "6 Clicks" Daily Activity  Outcome Measure Difficulty turning over in bed (including adjusting bedclothes, sheets and blankets)?: None Difficulty moving from lying on back to sitting on the side of the bed? : None Difficulty sitting down on and standing up from a chair with arms (e.g., wheelchair, bedside commode, etc,.)?: A Little Help needed moving to and from a bed to chair (including a wheelchair)?: A Little Help needed walking in hospital room?: A Lot Help needed climbing 3-5 steps with a railing? : Total 6 Click Score: 17    End of Session Equipment Utilized During Treatment: Gait belt Activity Tolerance: Patient tolerated treatment well Patient left: in chair;with call bell/phone within reach Nurse Communication: Mobility status PT Visit Diagnosis: Unsteadiness on feet (R26.81);Other abnormalities of gait and mobility (R26.89)    Time: 1400-1426 PT Time Calculation (min) (ACUTE ONLY): 26 min   Charges:   PT Evaluation $PT Eval Moderate Complexity: 1 Mod PT Treatments $Gait Training: 23-37 mins   PT G Codes:        02/02/18 [place], PT [phone]    Shanna Cisco 02-02-18, 2:37 PM

## 2018-01-15 NOTE — Progress Notes (Signed)
Patient ID: David Lane, male   DOB: [birth year], 72 y.o.   MRN: [id number]  PROGRESS NOTE    David Lane  PPJ:[id number] DOB: 10/23/1945 DOA: 01/12/2018  PCP: Dorena Cookey, MD   Brief Narrative:  72 year old male with past medical history significant forCADwith recent STEMI (3/26-31) s/p stenton Brilinta, HTN, HLD; combined CHF(EF 35-40% with grade 1 diastolic dysfunction on 5/80), DM who presented with cellulitisand left foot wound. Started on vanco and zosyn. Seen by Dr. Sharol Given due to concern for necrotizing infection and need for amputation.   Assessment & Plan:   Principal Problem: Sepsis arthritis of the first MTP joint  ([place]) / Diabetic insensate neuropathy with abscess and osteomyelitis of the left foot - MRI left foot showed soft tissue wound along the plantar and dorsal medial aspect of the forefoot at the level of the MTP joint with surrounding cellulitis. Wound extends deep to the first MTP joint capsule. Septic arthritis of the first MTP joint with adjacent marrow edema. 9 x 22 mm fluid collection along the plantar aspect of the first proximal phalanx extending into the medial and lateral soft tissues most concerning for an abscess and extension of an abscess intermixed with necrotic soft tissue concerning for a necrotizing infection.  - X ray of the left foot showed indings consistent wifth cellulitis centered about the right great toe and first metatarsal. No plain film evidence of osteomyelitis. Moderate right first MTP osteoarthritis - Started on vanco and zosyn - S/P transmetatarsal amputation 01/14/18 by Dr. Meridee Score - Now has wound vac over left foot   Active Problems:   Hyperlipidemia - Continue Crestor     Essential hypertension - Continue carvedilol 3.125 mg 2 times daily     Chronic combined systolic and diastolic CHF (congestive heart failure) ([place]) - Well compensated     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 (HCC) -  Continue SSI    Status post coronary artery stent placement - No chest pain    DVT prophylaxis: Brilinta   Code Status: full code  Family Communication: no family at bedside Disposition Plan:  Will follow up with ortho when pt stable for discharge    Consultants:   Orthopedic surgery   Procedures:  Transmetatarsal amputation 01/14/2018  Wound vac 4/20 -->  Antimicrobials:   Vanco and zosyn 4/18 -->   Subjective: No overnight events.  Objective: Vitals:   01/14/18 2345 01/15/18 0509 01/15/18 0733 01/15/18 0800  BP: 128/70 (!) 144/84 133/80 133/80  Pulse: 79 85 80 80  Resp: 16 16 18    Temp: 98.9 F (37.2 C) 98.4 F (36.9 C) 98.5 F (36.9 C)   TempSrc: Oral Oral Oral   SpO2: 98% 96% 97%   Weight:      Height:        Intake/Output Summary (Last 24 hours) at 01/15/2018 1132 Last data filed at 01/15/2018 0849 Gross per 24 hour  Intake 2040 ml  Output 200 ml  Net 1840 ml   Filed Weights   01/13/18 1301  Weight: 88.9 kg (196 lb)    Physical Exam  Constitutional: Appears well-developed and well-nourished. No distress.  CVS: RRR, S1/S2 + Pulmonary: Effort and breath sounds normal, no stridor, rhonchi, wheezes, rales.  Abdominal: Soft. BS +,  no distension, tenderness, rebound or guarding.  Musculoskeletal: Normal range of motion. No edema and no tenderness.  Lymphadenopathy: No lymphadenopathy noted, cervical, inguinal. Neuro: Alert. Normal reflexes, muscle tone coordination. No cranial nerve deficit. Skin: Skin is warm  and dry. Amputation left transmetatarsal with wound vac applied Psychiatric: Normal mood and affect. Behavior, judgment, thought content normal.     Data Reviewed: I have personally reviewed following labs and imaging studies  CBC: Recent Labs  Lab 01/12/18 1550 01/13/18 0626 01/15/18 0708  WBC 18.2* 14.8* 10.1  NEUTROABS 14.3*  --   --   HGB 10.6* 9.7* 9.3*  HCT 31.2* 28.6* 28.5*  MCV 88.1 88.0 90.2  PLT 316 308 381   Basic  Metabolic Panel: Recent Labs  Lab 01/12/18 1550 01/13/18 0626 01/15/18 0708  NA 131* 135 134*  K 3.4* 3.7 4.3  CL 98* 104 101  CO2 22 22 23   GLUCOSE 64* 136* 296*  BUN 20 18 13   CREATININE 1.38* 1.23 1.20  CALCIUM 8.0* 7.7* 7.9*   GFR: Estimated Creatinine Clearance: 63.8 mL/min (by C-G formula based on SCr of 1.2 mg/dL). Liver Function Tests: Recent Labs  Lab 01/12/18 1550  AST 38  ALT 26  ALKPHOS 71  BILITOT 1.6*  PROT 6.4*  ALBUMIN 2.3*   No results for input(s): LIPASE, AMYLASE in the last 168 hours. No results for input(s): AMMONIA in the last 168 hours. Coagulation Profile: No results for input(s): INR, PROTIME in the last 168 hours. Cardiac Enzymes: No results for input(s): CKTOTAL, CKMB, CKMBINDEX, TROPONINI in the last 168 hours. BNP (last 3 results) No results for input(s): PROBNP in the last 8760 hours. HbA1C: No results for input(s): HGBA1C in the last 72 hours. CBG: Recent Labs  Lab 01/14/18 1023 01/14/18 1116 01/14/18 1639 01/14/18 2247 01/15/18 0730  GLUCAP 152* 166* 310* 298* 274*   Lipid Profile: No results for input(s): CHOL, HDL, LDLCALC, TRIG, CHOLHDL, LDLDIRECT in the last 72 hours. Thyroid Function Tests: No results for input(s): TSH, T4TOTAL, FREET4, T3FREE, THYROIDAB in the last 72 hours. Anemia Panel: No results for input(s): VITAMINB12, FOLATE, FERRITIN, TIBC, IRON, RETICCTPCT in the last 72 hours. Urine analysis:    Component Value Date/Time   COLORURINE YELLOW 11/25/2014 1339   APPEARANCEUR CLEAR 11/25/2014 1339   LABSPEC 1.020 11/25/2014 1339   PHURINE 5.5 11/25/2014 1339   GLUCOSEU >=1000 (A) 11/25/2014 1339   HGBUR TRACE-LYSED (A) 11/25/2014 1339   HGBUR negative 08/03/2010 0919   BILIRUBINUR NEGATIVE 11/25/2014 1339   KETONESUR NEGATIVE 11/25/2014 1339   PROTEINUR NEGATIVE 10/18/2012 1358   UROBILINOGEN 0.2 11/25/2014 1339   NITRITE NEGATIVE 11/25/2014 1339   LEUKOCYTESUR NEGATIVE 11/25/2014 1339   Sepsis  Labs: @LABRCNTIP (procalcitonin:4,lacticidven:4)    Recent Results (from the past 240 hour(s))  Blood Culture (routine x 2)     Status: None (Preliminary result)   Collection Time: 01/12/18  3:50 PM  Result Value Ref Range Status   Specimen Description BLOOD RIGHT ANTECUBITAL  Final   Special Requests   Final    BOTTLES DRAWN AEROBIC AND ANAEROBIC Blood Culture adequate volume   Culture   Final    NO GROWTH 2 DAYS Performed at [facility], [place] [address] [place], [place] [address]    Report Status PENDING  Incomplete  Blood Culture (routine x 2)     Status: None (Preliminary result)   Collection Time: 01/12/18  4:00 PM  Result Value Ref Range Status   Specimen Description BLOOD LEFT ARM  Final   Special Requests   Final    BOTTLES DRAWN AEROBIC AND ANAEROBIC Blood Culture adequate volume   Culture   Final    NO GROWTH 2 DAYS Performed at [facility],  [address] [place], [place] [address]    Report Status PENDING  Incomplete      Radiology Studies: Mr Foot Left W Wo Contrast Result Date: 01/13/2018 1. Soft tissue wound along the plantar and dorsal medial aspect of the forefoot at the level of the MTP joint with surrounding cellulitis. Wound extends deep to the first MTP joint capsule. Septic arthritis of the first MTP joint with adjacent marrow edema. 9 x 22 mm fluid collection along the plantar aspect of the first proximal phalanx extending into the medial and lateral soft tissues most concerning for an abscess. Large areas of hypoenhancing soft tissue along the dorsal medial aspect of the forefoot extending into the first web space with multiple tiny foci of low signal concerning for extension of an abscess intermixed with necrotic soft tissue concerning for a necrotizing infection. Electronically Signed   By: Kathreen Devoid   On: 01/13/2018 13:28   Dg Chest Port 1 View Result Date: 01/12/2018 No acute cardiopulmonary process.   Dg Foot Complete Left Result  Date: 01/12/2018 Findings consistent with cellulitis centered about the right great toe and first metatarsal. No plain film evidence of osteomyelitis. Moderate right first MTP osteoarthritis.   Scheduled Meds: .  aspirin  81 mg Oral Daily  .  carvedilol  3.125 mg Oral BID WC  .  docusate sodium  100 mg Oral BID  . insulin aspart  0-5 Units Subcutaneous QHS  .  insulin aspart  0-9 Units Subcutaneous TID WC  .  pantoprazole  40 mg Oral Daily  .  rosuvastatin  40 mg Oral QHS  .  ticagrelor  90 mg Oral BID   Continuous Infusions: . sodium chloride Stopped (01/14/18 1134)  . lactated ringers 75 mL/hr at 01/14/18 1656  . methocarbamol (ROBAXIN)  IV    . piperacillin-tazobactam (ZOSYN)  IV 3.375 g (01/15/18 0910)  . vancomycin Stopped (01/15/18 0143)     LOS: 3 days    Time spent: 25 minutes  Greater than 50% of the time spent on counseling and coordinating the care.   Leisa Lenz, MD [facility] Pager [phone]  If 7PM-7AM, please contact night-coverage [email or web address] Password [facility] 01/15/2018, 11:32 AM

## 2018-01-16 ENCOUNTER — Encounter (HOSPITAL_BASED_OUTPATIENT_CLINIC_OR_DEPARTMENT_OTHER): Payer: Medicare Other

## 2018-01-16 DIAGNOSIS — Z89432 Acquired absence of left foot: Secondary | ICD-10-CM

## 2018-01-16 LAB — GLUCOSE, CAPILLARY
GLUCOSE-CAPILLARY: 330 mg/dL — AB (ref 65–99)
Glucose-Capillary: 360 mg/dL — ABNORMAL HIGH (ref 65–99)
Glucose-Capillary: 286 mg/dL — ABNORMAL HIGH (ref 65–99)
Glucose-Capillary: 293 mg/dL — ABNORMAL HIGH (ref 65–99)

## 2018-01-16 MED ORDER — INSULIN ASPART 100 UNIT/ML ~~LOC~~ SOLN
5.0000 [IU] | Freq: Three times a day (TID) | SUBCUTANEOUS | Status: DC
  Administered 2018-01-16 (×2): 5 [IU] via SUBCUTANEOUS

## 2018-01-16 NOTE — Progress Notes (Signed)
Pt BS this AM was 330 and was given 7u this afternoon BS rose to 360 MD notified

## 2018-01-16 NOTE — Progress Notes (Signed)
Inpatient Diabetes Program Recommendations  AACE/ADA: New Consensus Statement on Inpatient Glycemic Control (2015)  Target Ranges:  Prepandial:   less than 140 mg/dL      Peak postprandial:   less than 180 mg/dL (1-2 hours)      Critically ill patients:  140 - 180 mg/dL   Results for AARIN, BLUETT (MRN [id number]) as of 01/16/2018 12:27  Ref. Range 01/15/2018 07:30 01/15/2018 12:17 01/15/2018 16:14 01/15/2018 22:07  Glucose-Capillary Latest Ref Range: 65 - 99 mg/dL 274 (H)  5 units NOVOLOG  294 (H)  5 units NOVOLOG  321 (H)  7 units NOVOLOG  331 (H)  4 units NOVOLOG    Results for DENNISON, MCDAID (MRN [id number]) as of 01/16/2018 12:27  Ref. Range 01/16/2018 07:42 01/16/2018 12:05  Glucose-Capillary Latest Ref Range: 65 - 99 mg/dL 330 (H)  7 units NOVOLOG  360 (H)     Home DM Meds: Lantus 80 units QHS  Current Insulin Orders: Novolog Sensitive Correction Scale/ SSI (0-9 units) TID AC + HS      Novolog 5 units TID with meals      DM Coordinator spoke with pt on 04/19.  During that conversation, pt told DM Coordinator that he did NOT decrease his Lantus as suggested by the MD during his last admission and continued to take Lantus 80 units QHS.  Has not followed up with his ENDO Dr. Loanne Drilling yet.  Note that Novolog 5 units TID of meal coverage was started today.  Patient also likely needs some basal insulin as well. Could start with weight based dose and titrate upward as needed.   MD- Please consider the following in-hospital insulin adjustments:  1. Start Lantus 18 units daily (0.2 units/kg dosing based on weight of 89 kg)  Would NOT send pt home on Lantus 80 units QHS.     --Will follow patient during hospitalization--  Wyn Quaker RN, MSN, CDE Diabetes Coordinator Inpatient Glycemic Control Team Team Pager: [phone] (8a-5p)

## 2018-01-16 NOTE — Progress Notes (Signed)
David Lane from [facility]  Asked RN to pass message to Dr. Sharol Given that he thinks the patient would benefit from speaking to the DM coordinator about food choices. RN called MD no answer yet. Will try again

## 2018-01-16 NOTE — Progress Notes (Addendum)
Patient ID: David Lane, male   DOB: 1946-04-15, 72 y.o.   MRN: [id number] Postoperative day 2 left transmetatarsal amputation.  Dressing is clean and dry no drainage in the wound VAC canister.  Patient was able to sit up on the bed for 1 hour yesterday.  Physical therapy is recommending inpatient rehabilitation.  Nonweightbearing on the left lower extremity for 2 weeks.

## 2018-01-16 NOTE — Progress Notes (Signed)
Rehab admissions - I met with patient and gave him rehab booklets.  Patient would like to pursue inpatient rehab admission.  I will contact [employer] insurance to begin pre-authorization process.  We will need an OT Evaluation in order to proceed with potential inpatient rehab pre-authorization.  Call me for questions.  #[phone]

## 2018-01-16 NOTE — Progress Notes (Signed)
Physical Therapy Treatment Patient Details Name: David Lane MRN: [id number] DOB: 1945-12-28 Today's Date: 01/16/2018    History of Present Illness 72 year old male with past medical history significant forCADwith recent STEMI (3/26-31) s/p stenton Brilinta, HTN, HLD; combined CHF(EF 35-40% with grade 1 diastolic dysfunction on 3/29), DM who presented with cellulitisand left foot wound.W/up revealed osteomyelitis.  Dr. Sharol Given did transmet amputation on 01/14/18.      PT Comments    Pt received in bed willing to participate in PT.  Todays session focused on gait and therex.  Pt is supervision for bed mobility and only MinA for transfers due to safety.  Pt is ModA for gait due to safety and general weakness.  Educated pt on proper use and safety with rolling walker.  Pt walked to door and back with seated rest break halfway.  Emphasize increasing activity tolerance and safety with DME for future sessions.  Pt will benefit from continued POC to regain functional independence.  He can handle and would benefit from 3 hr aggressive PT so pt remains good candidate for CIR.      Follow Up Recommendations  CIR     Equipment Recommendations  Rolling walker with 5" wheels    Recommendations for Other Services Rehab consult     Precautions / Restrictions Precautions Precautions: Fall Restrictions Weight Bearing Restrictions: Yes LUE Weight Bearing: Non weight bearing    Mobility  Bed Mobility Overal bed mobility: Modified Independent                Transfers Overall transfer level: Needs assistance Equipment used: Rolling walker (2 wheeled) Transfers: Sit to/from Stand Sit to Stand: Min assist         General transfer comment: assist for sequencing and safety  Ambulation/Gait Ambulation/Gait assistance: Mod assist Ambulation Distance (Feet): 8 Feet(64ftx2 with seated rest break halfway.) Assistive device: Rolling walker (2 wheeled) Gait Pattern/deviations: Step-to  pattern Gait velocity: very slow   General Gait Details: 2x gait trials completed.  Went to door and back with seated rest break halfway.  Cues needed for sequencing and technique during first trial.  Pt demonstrated proper technique and sequencing during second trial w/o cues.     Stairs             Wheelchair Mobility    Modified Rankin (Stroke Patients Only)       Balance Overall balance assessment: Needs assistance Sitting-balance support: No upper extremity supported;Feet supported       Standing balance support: Bilateral upper extremity supported   Standing balance comment: reliant on RW for balance                            Cognition Arousal/Alertness: Awake/alert Behavior During Therapy: WFL for tasks assessed/performed Overall Cognitive Status: Within Functional Limits for tasks assessed                                        Exercises General Exercises - Lower Extremity Long Arc Quad: AROM;10 reps;Both;Seated Hip ABduction/ADduction: AROM;10 reps;Both;Seated Straight Leg Raises: AROM;10 reps;Both;Seated    General Comments        Pertinent Vitals/Pain Pain Assessment: No/denies pain    Home Living                      Prior Function  PT Goals (current goals can now be found in the care plan section) Acute Rehab PT Goals [name] PT Goal Formulation: With patient Time For Goal Achievement: 01/29/18 Potential to Achieve Goals: Good Progress towards PT goals: Progressing toward goals    Frequency    Min 3X/week      PT Plan      Co-evaluation              AM-PAC PT "6 Clicks" Daily Activity  Outcome Measure  Difficulty turning over in bed (including adjusting bedclothes, sheets and blankets)?: None Difficulty moving from lying on back to sitting on the side of the bed? : None Difficulty sitting down on and standing up from a chair with arms  (e.g., wheelchair, bedside commode, etc,.)?: A Little Help needed moving to and from a bed to chair (including a wheelchair)?: A Little Help needed walking in hospital room?: A Lot Help needed climbing 3-5 steps with a railing? : Total 6 Click Score: 17    End of Session Equipment Utilized During Treatment: Gait belt Activity Tolerance: Patient tolerated treatment well Patient left: in chair;with call bell/phone within reach Nurse Communication: Mobility status PT Visit Diagnosis: Unsteadiness on feet (R26.81);Other abnormalities of gait and mobility (R26.89)     Time: 9604-5409 PT Time Calculation (min) (ACUTE ONLY): 35 min  Charges:  $Gait Training: 8-22 mins $Therapeutic Activity: 8-22 mins                    G Codes:       Terri Skains, SPTA [phone]    Terri Skains 01/16/2018, 5:20 PM

## 2018-01-16 NOTE — Care Management Important Message (Signed)
Important Message  Patient Details  Name: David Lane MRN: [id number] Date of Birth: 06-08-1946   Medicare Important Message Given:  Yes    Orbie Pyo 01/16/2018, 1:53 PM

## 2018-01-16 NOTE — Consult Note (Signed)
Physical Medicine and Rehabilitation Consult   Reason for Consult: Functional deficits due to transmetatarsal amputation.  Referring Physician: Dr. Charlies Silvers.    HPI: David Lane is a 72 y.o. male with history of CAD with ICM--recent stent,  HTN, pancreatitis, T1DM who was admitted on 01/12/18 with left foot drainage with abscess,  cellulitis and osteomyelitis. He was started on IV antibiotics and underwent transmetatarsal amputation left foot by Dr. Sharol Given on 04/20. To be NWB with recommendations to continue antibiotics for 3 days post op.   PT evaluation done and CIR recommended due to functional deficits.    Review of Systems  HENT: Negative for hearing loss and tinnitus.   Eyes: Negative for blurred vision and double vision.  Cardiovascular: Negative for chest pain.  Gastrointestinal: Negative for heartburn and nausea.  Genitourinary: Negative for dysuria and urgency.  Musculoskeletal: Negative for back pain and myalgias.  Neurological: Positive for dizziness (since surgery) and sensory change. Negative for headaches.  Psychiatric/Behavioral: The patient has insomnia.       Past Medical History:  Diagnosis Date  . CAD (coronary artery disease)    last cath by [facility] DR.  Mihai Croitoru showing  some  disease involving LCX and small size of Diag   . CHF exacerbation, due to diastolic dysfunction 37/62/8315  . Chronic systolic CHF (congestive heart failure), NYHA class 1 ([place]) 07/17/2012  . GERD (gastroesophageal reflux disease)   . Hepatic lesion 02/04/11  . Hyperlipemia   . Hypertension   . Ischemic cardiomyopathy    EF 35-40%  . Liver hemangioma   . NSTEMI (non-ST elevated myocardial infarction) ([place]) 11/21/2009  . Pancreatitis 2000's  . Shortness of breath   . Type II diabetes mellitus ([place])     Past Surgical History:  Procedure Laterality Date  . AMPUTATION Left 01/14/2018   Procedure: TRANSMETATARSAL AMPUTATION;  Surgeon: Newt Minion, MD;  Location: [place];   Service: Orthopedics;  Laterality: Left;  . CARDIAC CATHETERIZATION  2011   minimal disease, medical management  . CORONARY ANGIOPLASTY WITH STENT PLACEMENT  07/14/2012   successful PCI & stenting of mid LAD & PDA off the dominant CX  . CORONARY/GRAFT ACUTE MI REVASCULARIZATION N/A 12/20/2017   Procedure: Coronary/Graft Acute MI Revascularization;  Surgeon: Martinique, Peter M, MD;  Location: [place] CV LAB;  Service: Cardiovascular;  Laterality: N/A;  . INCISION AND DRAINAGE ABSCESS ANAL  1970's  . LEFT HEART CATH AND CORONARY ANGIOGRAPHY N/A 12/20/2017   Procedure: LEFT HEART CATH AND CORONARY ANGIOGRAPHY;  Surgeon: Martinique, Peter M, MD;  Location: [place] CV LAB;  Service: Cardiovascular;  Laterality: N/A;  . LEFT HEART CATHETERIZATION WITH CORONARY ANGIOGRAM N/A 07/14/2012   Procedure: LEFT HEART CATHETERIZATION WITH CORONARY ANGIOGRAM;  Surgeon: [name], MD;  Location: [place] CATH LAB;  Service: Cardiovascular;  Laterality: N/A;  . PERCUTANEOUS CORONARY STENT INTERVENTION (PCI-S) Right 07/14/2012   Procedure: PERCUTANEOUS CORONARY STENT INTERVENTION (PCI-S);  Surgeon: [name], MD;  [facility] CATH LAB;  Service: Cardiovascular;  Laterality: Right;    Family History  Problem Relation Age of Onset  . Diabetes Mother   . Diabetes Paternal Grandmother   . Diabetes Maternal Grandfather   . Colon cancer Neg Hx     Social History:  Lives alone and independent PTA. Sister and/girlfriend have been helping out for the past 2 weeks. He reports that he has never smoked. He has never used smokeless tobacco. He reports that he does not drink alcohol or  use drugs.    Allergies  Allergen Reactions  . Lipitor [Atorvastatin] Other (See Comments)    Muscle weakness  . Metformin And Related Diarrhea    Severe diarrhea   Medications Prior to Admission  Medication Sig Dispense Refill  . aspirin 81 MG tablet Take 81 mg by mouth daily.      . carvedilol (COREG) 3.125 MG tablet Take 1  tablet (3.125 mg total) by mouth 2 (two) times daily with a meal. 60 tablet 6  . Insulin Glargine (LANTUS SOLOSTAR) 100 UNIT/ML Solostar Pen Inject 15 Units into the skin daily at 10 pm. NEED APPOINTMENT (Patient taking differently: Inject 80 Units into the skin daily before breakfast. ) 9 pen 1  . lisinopril (PRINIVIL,ZESTRIL) 20 MG tablet Take 0.5 tablets (10 mg total) by mouth daily. (Patient taking differently: Take 10 mg by mouth 2 (two) times daily. ) 180 tablet 3  . pantoprazole (PROTONIX) 40 MG tablet Take 1 tablet (40 mg total) by mouth daily. 90 tablet 3  . rosuvastatin (CRESTOR) 40 MG tablet Take 1 tablet (40 mg total) by mouth at bedtime. 30 tablet 1  . ticagrelor (BRILINTA) 90 MG TABS tablet Take 1 tablet (90 mg total) by mouth 2 (two) times daily. 180 tablet 3  . glucose blood (ACCU-CHEK AVIVA) test strip 4 (four) times daily. DX code e11.9 100 each 0  . insulin aspart (NOVOLOG) 100 UNIT/ML injection Inject 5 Units into the skin 3 (three) times daily with meals. 10 mL 11  . Insulin Pen Needle (B-D UF III MINI PEN NEEDLES) 31G X 5 MM MISC use to inject insulin daily 100 each 0    Home: Home Living Family/patient expects to be discharged to:: Private residence Living Arrangements: Alone Type of Home: House Home Access: Stairs to enter Technical brewer of Steps: 3 Entrance Stairs-Rails: None Home Layout: One level Home Equipment: None  Functional History: Prior Function Level of Independence: Independent Functional Status:  Mobility: Bed Mobility Overal bed mobility: Modified Independent Transfers Overall transfer level: Needs assistance Equipment used: Rolling walker (2 wheeled) Transfers: Sit to/from Stand Sit to Stand: Min assist General transfer comment: assist for sequencing and safety Ambulation/Gait Ambulation/Gait assistance: Mod assist Ambulation Distance (Feet): 2 Feet Assistive device: Rolling walker (2 wheeled) General Gait Details: just took a  couple of steps to the chair, very unsteady and nervous with walker.    ADL:    Cognition: Cognition Overall Cognitive Status: Within Functional Limits for tasks assessed Orientation Level: Oriented X4 Cognition Arousal/Alertness: Awake/alert Behavior During Therapy: WFL for tasks assessed/performed Overall Cognitive Status: Within Functional Limits for tasks assessed  Blood pressure 117/65, pulse 77, temperature 98 F (36.7 C), temperature source Oral, resp. rate 20, height 6' (1.829 m), weight 88.9 kg (196 lb), SpO2 96 %. Physical Exam  Nursing note and vitals reviewed. Constitutional: He is oriented to person, place, and time. He appears well-developed and well-nourished. No distress.  HENT:  Head: Normocephalic and atraumatic.  Mouth/Throat: Oropharynx is clear and moist.  Eyes: Pupils are equal, round, and reactive to light. Conjunctivae and EOM are normal. Right eye exhibits no discharge.  Neck: Normal range of motion. Neck supple.  Cardiovascular: Normal rate and regular rhythm.  Respiratory: Effort normal and breath sounds normal. No stridor. No respiratory distress. He has no wheezes.  GI: Bowel sounds are normal. He exhibits no distension. There is no tenderness.  Musculoskeletal: He exhibits edema.  Left foot transmet amputation with VAC in place. Right foot without deformities.  Neurological: He is alert and oriented to person, place, and time.  UE motor nearly 5/5. Able to lift leg foot off chair. RLE: 4/5 prox to distal. Decreased LT distal RLE  Skin: Skin is warm and dry. He is not diaphoretic.  Psychiatric: He has a normal mood and affect. His behavior is normal.  verbose    Results for orders placed or performed during the hospital encounter of 01/12/18 (from the past 24 hour(s))  Glucose, capillary     Status: Abnormal   Collection Time: 01/15/18 12:17 PM  Result Value Ref Range   Glucose-Capillary 294 (H) 65 - 99 mg/dL  Glucose, capillary     Status:  Abnormal   Collection Time: 01/15/18  4:14 PM  Result Value Ref Range   Glucose-Capillary 321 (H) 65 - 99 mg/dL  Glucose, capillary     Status: Abnormal   Collection Time: 01/15/18 10:07 PM  Result Value Ref Range   Glucose-Capillary 331 (H) 65 - 99 mg/dL  Glucose, capillary     Status: Abnormal   Collection Time: 01/16/18  7:42 AM  Result Value Ref Range   Glucose-Capillary 330 (H) 65 - 99 mg/dL   No results found.   Assessment/Plan: Diagnosis: PAD s/p left TMA with subsequent gait disorder 1. Does the need for close, 24 hr/day medical supervision in concert with the patient's rehab needs make it unreasonable for this patient to be served in a less intensive setting? Yes 2. Co-Morbidities requiring supervision/potential complications: wound care, poorly controlled diabetes, CHF, htn, wound care/vac 3. Due to bladder management, bowel management, safety, skin/wound care, disease management, medication administration, pain management and patient education, does the patient require 24 hr/day rehab nursing? Yes 4. Does the patient require coordinated care of a physician, rehab nurse, PT (1-2 hrs/day, 5 days/week) and OT (1-2 hrs/day, 5 days/week) to address physical and functional deficits in the context of the above medical diagnosis(es)? Yes Addressing deficits in the following areas: balance, endurance, locomotion, strength, transferring, bowel/bladder control, bathing, dressing, feeding, grooming, toileting and psychosocial support 5. Can the patient actively participate in an intensive therapy program of at least 3 hrs of therapy per day at least 5 days per week? Yes 6. The potential for patient to make measurable gains while on inpatient rehab is excellent 7. Anticipated functional outcomes upon discharge from inpatient rehab are modified independent  with PT, modified independent with OT, n/a with SLP. 8. Estimated rehab length of stay to reach the above functional goals is: 12-15  days 9. Anticipated D/C setting: Home 10. Anticipated post D/C treatments: [place] therapy 11. Overall Rehab/Functional Prognosis: excellent  RECOMMENDATIONS: This patient's condition is appropriate for continued rehabilitative care in the following setting: CIR Patient has agreed to participate in recommended program. Yes Note that insurance prior authorization may be required for reimbursement for recommended care.  Comment: Rehab Admissions Coordinator to follow up.  Thanks,  Meredith Staggers, MD, Mellody Drown     Bary Leriche, PA-C 01/16/2018

## 2018-01-16 NOTE — Progress Notes (Signed)
Rehab Admissions Coordinator Note:  Patient was screened by [name] for appropriateness for an Inpatient Acute Rehab Consult.  At this time, we are recommending Inpatient Rehab consult.  [name] 01/16/2018, 8:48 AM  I can be reached at [phone].

## 2018-01-16 NOTE — Progress Notes (Signed)
Patient ID: David Lane, male   DOB: 12/07/1945, 72 y.o.   MRN: [id number]  PROGRESS NOTE    David Lane  QBH:[id number] DOB: Jul 13, 1946 DOA: 01/12/2018  PCP: Dorena Cookey, MD   Brief Narrative:  72 year old male with past medical history significant forCADwith recent STEMI (3/26-31) s/p stenton Brilinta, HTN, HLD; combined CHF(EF 35-40% with grade 1 diastolic dysfunction on 0/97), DM who presented with cellulitisand left foot wound. Started on vanco and zosyn. Seen by Dr. Sharol Given due to concern for necrotizing infection and need for amputation. Pt underwent left transmetatarsal amputation 01/14/2018.   Assessment & Plan:   Principal Problem: Sepsis arthritis of the first MTP joint  ([place]) / Diabetic insensate neuropathy with abscess and osteomyelitis of the left foot - MRI left foot showed soft tissue wound along the plantar and dorsal medial aspect of the forefoot at the level of the MTP joint with surrounding cellulitis. Wound extends deep to the first MTP joint capsule. Septic arthritis of the first MTP joint with adjacent marrow edema. 9 x 22 mm fluid collection along the plantar aspect of the first proximal phalanx extending into the medial and lateral soft tissues most concerning for an abscess and extension of an abscess intermixed with necrotic soft tissue concerning for a necrotizing infection.  - X ray of the left foot showed indings consistent wifth cellulitis centered about the right great toe and first metatarsal. No plain film evidence of osteomyelitis. Moderate right first MTP osteoarthritis - Started on vanco and zosyn - S/P transmetatarsal amputation 01/14/18 by Dr. Meridee Score - Continue wound vac - Inpatient rehab consultation    Active Problems:   Hyperlipidemia - Continue Crestor     Essential hypertension - Continue carvedilol 3.125 mg 2 times daily  - BP stable     Chronic combined systolic and diastolic CHF (congestive heart failure) ([place]) -  Compensated     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 (HCC) - Continue SSI - CBG's in past 24 hours: 321, 331, 330 - Add novolog 5 units TIDAC    Status post coronary artery stent placement - No chest pain    DVT prophylaxis: Brilinta   Code Status: full code  Family Communication: no family at bedside  Disposition Plan:  Consult to inpatient rehab    Consultants:   Orthopedic surgery   Procedures:  Transmetatarsal amputation 01/14/2018  Wound vac 4/20 -->  Antimicrobials:   Vanco and zosyn 4/18 -->   Subjective: No overnight events.  Objective: Vitals:   01/15/18 1219 01/15/18 1645 01/15/18 2025 01/16/18 0558  BP: (!) 175/92 (!) 175/92 132/73 117/65  Pulse: 80 80 81 77  Resp: 16  20   Temp: 98.5 F (36.9 C)  98.4 F (36.9 C) 98 F (36.7 C)  TempSrc: Oral  Oral Oral  SpO2: 98%  99% 96%  Weight:      Height:        Intake/Output Summary (Last 24 hours) at 01/16/2018 0912 Last data filed at 01/16/2018 3532 Gross per 24 hour  Intake 2779.33 ml  Output 1153 ml  Net 1626.33 ml   Filed Weights   01/13/18 1301  Weight: 88.9 kg (196 lb)     Physical Exam  Constitutional: Appears well-developed and well-nourished. No distress.  HENT: Normocephalic. External right and left ear normal. Oropharynx is clear and moist.  Eyes: Conjunctivae and EOM are normal. PERRLA, no scleral icterus.  Neck: Normal ROM. Neck supple. No JVD. No tracheal deviation. No thyromegaly.  CVS: RRR, S1/S2 (+) Pulmonary: Effort and breath sounds normal, no stridor, rhonchi, wheezes, rales.  Abdominal: Soft. BS +,  no distension, tenderness, rebound or guarding.  Musculoskeletal: Normal range of motion. No tenderness  Lymphadenopathy: No lymphadenopathy noted, cervical, inguinal. Neuro: Alert. Normal reflexes, muscle tone coordination. No cranial nerve deficit. Skin: left foot wound vac Psychiatric: Normal mood and affect. Behavior, judgment, thought content normal.         Data Reviewed: I have personally reviewed following labs and imaging studies  CBC: Recent Labs  Lab 01/12/18 1550 01/13/18 0626 01/15/18 0708  WBC 18.2* 14.8* 10.1  NEUTROABS 14.3*  --   --   HGB 10.6* 9.7* 9.3*  HCT 31.2* 28.6* 28.5*  MCV 88.1 88.0 90.2  PLT 316 308 382   Basic Metabolic Panel: Recent Labs  Lab 01/12/18 1550 01/13/18 0626 01/15/18 0708  NA 131* 135 134*  K 3.4* 3.7 4.3  CL 98* 104 101  CO2 22 22 23   GLUCOSE 64* 136* 296*  BUN 20 18 13   CREATININE 1.38* 1.23 1.20  CALCIUM 8.0* 7.7* 7.9*   GFR: Estimated Creatinine Clearance: 63.8 mL/min (by C-G formula based on SCr of 1.2 mg/dL). Liver Function Tests: Recent Labs  Lab 01/12/18 1550  AST 38  ALT 26  ALKPHOS 71  BILITOT 1.6*  PROT 6.4*  ALBUMIN 2.3*   No results for input(s): LIPASE, AMYLASE in the last 168 hours. No results for input(s): AMMONIA in the last 168 hours. Coagulation Profile: No results for input(s): INR, PROTIME in the last 168 hours. Cardiac Enzymes: No results for input(s): CKTOTAL, CKMB, CKMBINDEX, TROPONINI in the last 168 hours. BNP (last 3 results) No results for input(s): PROBNP in the last 8760 hours. HbA1C: No results for input(s): HGBA1C in the last 72 hours. CBG: Recent Labs  Lab 01/15/18 0730 01/15/18 1217 01/15/18 1614 01/15/18 2207 01/16/18 0742  GLUCAP 274* 294* 321* 331* 330*   Lipid Profile: No results for input(s): CHOL, HDL, LDLCALC, TRIG, CHOLHDL, LDLDIRECT in the last 72 hours. Thyroid Function Tests: No results for input(s): TSH, T4TOTAL, FREET4, T3FREE, THYROIDAB in the last 72 hours. Anemia Panel: No results for input(s): VITAMINB12, FOLATE, FERRITIN, TIBC, IRON, RETICCTPCT in the last 72 hours. Urine analysis:    Component Value Date/Time   COLORURINE YELLOW 11/25/2014 1339   APPEARANCEUR CLEAR 11/25/2014 1339   LABSPEC 1.020 11/25/2014 1339   PHURINE 5.5 11/25/2014 1339   GLUCOSEU >=1000 (A) 11/25/2014 1339   HGBUR  TRACE-LYSED (A) 11/25/2014 1339   HGBUR negative 08/03/2010 0919   BILIRUBINUR NEGATIVE 11/25/2014 1339   KETONESUR NEGATIVE 11/25/2014 1339   PROTEINUR NEGATIVE 10/18/2012 1358   UROBILINOGEN 0.2 11/25/2014 1339   NITRITE NEGATIVE 11/25/2014 1339   LEUKOCYTESUR NEGATIVE 11/25/2014 1339   Sepsis Labs: @LABRCNTIP (procalcitonin:4,lacticidven:4)    Recent Results (from the past 240 hour(s))  Blood Culture (routine x 2)     Status: None (Preliminary result)   Collection Time: 01/12/18  3:50 PM  Result Value Ref Range Status   Specimen Description BLOOD RIGHT ANTECUBITAL  Final   Special Requests   Final    BOTTLES DRAWN AEROBIC AND ANAEROBIC Blood Culture adequate volume   Culture   Final    NO GROWTH 3 DAYS Performed at [facility], [place] [address], [place], [place] [address]    Report Status PENDING  Incomplete  Blood Culture (routine x 2)     Status: None (Preliminary result)   Collection Time: 01/12/18  4:00 PM  Result Value Ref Range Status   Specimen Description BLOOD LEFT ARM  Final   Special Requests   Final    BOTTLES DRAWN AEROBIC AND ANAEROBIC Blood Culture adequate volume   Culture   Final    NO GROWTH 3 DAYS Performed at [facility], [address], [place], [place] [address]    Report Status PENDING  Incomplete  Aerobic/Anaerobic Culture (surgical/deep wound)     Status: None (Preliminary result)   Collection Time: 01/14/18 10:03 PM  Result Value Ref Range Status   Specimen Description ABSCESS LEFT FOOT  Final   Special Requests PATIENT ON FOLLOWING VANCOMYCIN,ZOSYN  Final   Gram Stain   Final    ABUNDANT WBC PRESENT, PREDOMINANTLY PMN MODERATE GRAM NEGATIVE RODS FEW GRAM POSITIVE COCCI IN PAIRS Performed at [facility], [place] [address] [place], [place] [address]    Culture PENDING  Incomplete   Report Status PENDING  Incomplete      Radiology Studies: Mr Foot Left W Wo Contrast Result Date: 01/13/2018 1. Soft tissue wound along the  plantar and dorsal medial aspect of the forefoot at the level of the MTP joint with surrounding cellulitis. Wound extends deep to the first MTP joint capsule. Septic arthritis of the first MTP joint with adjacent marrow edema. 9 x 22 mm fluid collection along the plantar aspect of the first proximal phalanx extending into the medial and lateral soft tissues most concerning for an abscess. Large areas of hypoenhancing soft tissue along the dorsal medial aspect of the forefoot extending into the first web space with multiple tiny foci of low signal concerning for extension of an abscess intermixed with necrotic soft tissue concerning for a necrotizing infection. Electronically Signed   By: Kathreen Devoid   On: 01/13/2018 13:28   Dg Chest Port 1 View Result Date: 01/12/2018 No acute cardiopulmonary process.   Dg Foot Complete Left Result Date: 01/12/2018 Findings consistent with cellulitis centered about the right great toe and first metatarsal. No plain film evidence of osteomyelitis. Moderate right first MTP osteoarthritis.   Scheduled Meds: .  aspirin  81 mg Oral Daily  .  carvedilol  3.125 mg Oral BID WC  .  docusate sodium  100 mg Oral BID  . insulin aspart  0-5 Units Subcutaneous QHS  .  insulin aspart  0-9 Units Subcutaneous TID WC  .  pantoprazole  40 mg Oral Daily  .  rosuvastatin  40 mg Oral QHS  .  ticagrelor  90 mg Oral BID   Continuous Infusions: . sodium chloride 10 mL/hr at 01/16/18 0004  . lactated ringers 75 mL/hr at 01/14/18 1656  . methocarbamol (ROBAXIN)  IV    . piperacillin-tazobactam (ZOSYN)  IV 3.375 g (01/16/18 0809)  . vancomycin Stopped (01/16/18 0449)     LOS: 4 days    Time spent: 25 minutes  Greater than 50% of the time spent on counseling and coordinating the care.   Leisa Lenz, MD [facility] Pager [phone]  If 7PM-7AM, please contact night-coverage [email or web address] Password [facility] 01/16/2018, 9:12 AM

## 2018-01-17 LAB — GLUCOSE, CAPILLARY
Glucose-Capillary: 386 mg/dL — ABNORMAL HIGH (ref 65–99)
Glucose-Capillary: 340 mg/dL — ABNORMAL HIGH (ref 65–99)
Glucose-Capillary: 250 mg/dL — ABNORMAL HIGH (ref 65–99)
Glucose-Capillary: 296 mg/dL — ABNORMAL HIGH (ref 65–99)
Glucose-Capillary: 326 mg/dL — ABNORMAL HIGH (ref 65–99)
Glucose-Capillary: 342 mg/dL — ABNORMAL HIGH (ref 65–99)
Glucose-Capillary: 372 mg/dL — ABNORMAL HIGH (ref 65–99)

## 2018-01-17 LAB — CULTURE, BLOOD (ROUTINE X 2)
Culture: NO GROWTH
Culture: NO GROWTH
Special Requests: ADEQUATE
Special Requests: ADEQUATE

## 2018-01-17 LAB — VANCOMYCIN, TROUGH: VANCOMYCIN TR: 20 ug/mL (ref 15–20)

## 2018-01-17 MED ORDER — INSULIN GLARGINE 100 UNIT/ML ~~LOC~~ SOLN
30.0000 [IU] | Freq: Every day | SUBCUTANEOUS | Status: DC
  Administered 2018-01-17: 30 [IU] via SUBCUTANEOUS
  Filled 2018-01-17 (×3): qty 0.3

## 2018-01-17 MED ORDER — INSULIN GLARGINE 100 UNIT/ML ~~LOC~~ SOLN
50.0000 [IU] | Freq: Every day | SUBCUTANEOUS | Status: DC
  Administered 2018-01-17 – 2018-01-20 (×4): 50 [IU] via SUBCUTANEOUS
  Filled 2018-01-17 (×4): qty 0.5

## 2018-01-17 MED ORDER — INSULIN GLARGINE 100 UNIT/ML ~~LOC~~ SOLN
18.0000 [IU] | Freq: Every day | SUBCUTANEOUS | Status: DC
  Filled 2018-01-17: qty 0.18

## 2018-01-17 MED ORDER — INSULIN GLARGINE 100 UNIT/ML SOLOSTAR PEN: 80.0000 [IU] | PEN_INJECTOR | Freq: Every day | SUBCUTANEOUS | Status: DC

## 2018-01-17 NOTE — Progress Notes (Addendum)
Inpatient Diabetes Program Recommendations  AACE/ADA: New Consensus Statement on Inpatient Glycemic Control (2015)  Target Ranges:  Prepandial:   less than 140 mg/dL      Peak postprandial:   less than 180 mg/dL (1-2 hours)      Critically ill patients:  140 - 180 mg/dL   Results for David Lane, David Lane (MRN [id number]) as of 01/17/2018 11:23  Ref. Range 01/16/2018 07:42 01/16/2018 12:05 01/16/2018 16:33 01/16/2018 21:09  Glucose-Capillary Latest Ref Range: 65 - 99 mg/dL 330 (H) 360 (H) 286 (H) 293 (H)   Results for David Lane, David Lane (MRN [id number]) as of 01/17/2018 11:23  Ref. Range 01/17/2018 06:54  Glucose-Capillary Latest Ref Range: 65 - 99 mg/dL 250 (H)     Home DM Meds: Lantus 80 units QHS  Current Insulin Orders: Novolog Sensitive Correction Scale/ SSI (0-9 units) TID AC + HS      Lantus 50 units AM/ 30 units PM      DM Coordinator spoke with pt on 04/19.  During that conversation, pt told DM Coordinator that he did NOT decrease his Lantus as suggested by the MD during his last admission and continued to take Lantus 80 units QHS.  Has not followed up with his ENDO Dr. Loanne Drilling yet.  Note that Lantus 50 units AM/ 30 units PM started today.     MD- May want to start with a lower dose of Lantus than the total amount he takes at home given patient was Hypoglycemic on admission (CBG was 44 mg/dl on 01/12/18 admission day).  Note that Lantus 50 units already given this AM.  Please consider stopping Lantus 30 units QPM for tonight.  Can re-assess tomorrow AM to see if additional Lantus needed.      --Will follow patient during hospitalization--  Wyn Quaker RN, MSN, CDE Diabetes Coordinator Inpatient Glycemic Control Team Team Pager: [phone] (8a-5p)

## 2018-01-17 NOTE — Plan of Care (Signed)
  Problem: Health Behavior/Discharge Planning: Goal: Ability to manage health-related needs will improve Outcome: Progressing   Problem: Clinical Measurements: Goal: Ability to maintain clinical measurements within normal limits will improve Outcome: Progressing Goal: Will remain free from infection Outcome: Progressing Goal: Diagnostic test results will improve Outcome: Progressing Goal: Respiratory complications will improve Outcome: Progressing Goal: Cardiovascular complication will be avoided Outcome: Progressing   Problem: Nutrition: Goal: Adequate nutrition will be maintained Outcome: Progressing   Problem: Coping: Goal: Level of anxiety will decrease Outcome: Progressing   Problem: Elimination: Goal: Will not experience complications related to bowel motility Outcome: Progressing Goal: Will not experience complications related to urinary retention Outcome: Progressing   Problem: Skin Integrity: Goal: Risk for impaired skin integrity will decrease Outcome: Progressing   Problem: Clinical Measurements: Goal: Ability to avoid or minimize complications of infection will improve Outcome: Progressing   Problem: Skin Integrity: Goal: Skin integrity will improve Outcome: Progressing

## 2018-01-17 NOTE — Evaluation (Signed)
Occupational Therapy Evaluation Patient Details Name: David Lane MRN: [id number] DOB: 04/18/1946 Today's Date: 01/17/2018    History of Present Illness 72 year old male with past medical history significant forCADwith recent STEMI (3/26-31) s/p stenton Brilinta, HTN, HLD; combined CHF(EF 35-40% with grade 1 diastolic dysfunction on 1/54), DM who presented with cellulitisand left foot wound.W/up revealed osteomyelitis.  Dr. Sharol Given did transmet amputation on 01/14/18 .     Clinical Impression   Patient is s/p L transmet amputation surgery resulting in functional limitations due to the deficits listed below (see OT problem list). Pt currently requires MIN (A) for R LE adls and basic transfers. Pt progressing well with therapy. Pt encouraged OOB for all meals and incr activity to incr activity tolerance.  Patient will benefit from skilled OT acutely to increase independence and safety with ADLS to allow discharge CIR.     Follow Up Recommendations  CIR    Equipment Recommendations  3 in 1 bedside commode    Recommendations for Other Services Rehab consult     Precautions / Restrictions Precautions Precautions: Fall Precaution Comments: pt with would vac present and amputation of midfoot on L Restrictions Weight Bearing Restrictions: Yes LUE Weight Bearing: Non weight bearing      Mobility Bed Mobility Overal bed mobility: Modified Independent                Transfers Overall transfer level: Needs assistance Equipment used: Rolling walker (2 wheeled) Transfers: Sit to/from Stand Sit to Stand: Min assist         General transfer comment: pt requires cues for safety and to static stand prior to attempting to tranfsre. pt fall risk at this time    Balance           Standing balance support: Bilateral upper extremity supported Standing balance-Leahy Scale: Fair Standing balance comment: dependent on RW                           ADL either  performed or assessed with clinical judgement   ADL Overall ADL's : Needs assistance/impaired Eating/Feeding: Independent   Grooming: Wash/dry hands;Wash/dry face;Oral care;Supervision/safety;Sitting Grooming Details (indicate cue type and reason): pt requires (A) to safely reach sink levle and sitting to complete grooming at the sink. cues for safety during task  Upper Body Bathing: Supervision/ safety;Sitting Upper Body Bathing Details (indicate cue type and reason): sink level  Lower Body Bathing: Minimal assistance;Sit to/from stand   Upper Body Dressing : Supervision/safety   Lower Body Dressing: Minimal assistance Lower Body Dressing Details (indicate cue type and reason): pt able to complete R LE but requires (A) with L LE with wound vac Toilet Transfer: Minimal assistance;RW Toilet Transfer Details (indicate cue type and reason): bsc         Functional mobility during ADLs: Minimal assistance;Rolling walker General ADL Comments: pt complete sink level groomign and bathing task. pt declined shaving after specifically requesting due to fatigue with adl task. pt states "i can't believe just this bit of doing has worn me out"     Vision Baseline Vision/History: Wears glasses Wears Glasses: Reading only       Perception     Praxis      Pertinent Vitals/Pain Pain Assessment: No/denies pain     Hand Dominance Right   Extremity/Trunk Assessment Upper Extremity Assessment Upper Extremity Assessment: Overall WFL for tasks assessed   Lower Extremity Assessment Lower Extremity Assessment: Defer to PT evaluation  Cervical / Trunk Assessment Cervical / Trunk Assessment: Normal   Communication Communication Communication: No difficulties   Cognition Arousal/Alertness: Awake/alert Behavior During Therapy: WFL for tasks assessed/performed Overall Cognitive Status: Within Functional Limits for tasks assessed                                     General  Comments  dressing dry and intact. pt touching L LE on the ground once during session and states oh it touch for a second ... pt did not weight bear onto the limb but rather resting it on the ground with sink level but had awareness as soon as it touched and immediately corrected teh error    Exercises     Shoulder Instructions      Home Living Family/patient expects to be discharged to:: Inpatient rehab                                        Prior Functioning/Environment Level of Independence: Independent                 OT Problem List: Decreased strength;Decreased activity tolerance;Impaired balance (sitting and/or standing);Decreased safety awareness;Decreased knowledge of use of DME or AE;Decreased knowledge of precautions;Pain      OT Treatment/Interventions: Self-care/ADL training;Therapeutic exercise;DME and/or AE instruction;Therapeutic activities;Patient/family education;Balance training    OT Goals(Current goals can be found in the care plan section) Acute Rehab OT Goals [name] OT Goal Formulation: With patient Time For Goal Achievement: 01/31/18 Potential to Achieve Goals: Good  OT Frequency: Min 3X/week   Barriers to D/C:            Co-evaluation              AM-PAC PT "6 Clicks" Daily Activity     Outcome Measure Help from another person eating meals?: None Help from another person taking care of personal grooming?: None Help from another person toileting, which includes using toliet, bedpan, or urinal?: A Little Help from another person bathing (including washing, rinsing, drying)?: A Little Help from another person to put on and taking off regular upper body clothing?: None Help from another person to put on and taking off regular lower body clothing?: A Little 6 Click Score: 21   End of Session Equipment Utilized During Treatment: Gait belt;Rolling walker Nurse Communication: Mobility  status;Precautions;Weight bearing status  Activity Tolerance: Patient tolerated treatment well Patient left: in chair;with call bell/phone within reach  OT Visit Diagnosis: Unsteadiness on feet (R26.81)                Time: 6546-5035 OT Time Calculation (min): 50 min Charges:  OT General Charges $OT Visit: 1 Visit OT Evaluation $OT Eval Moderate Complexity: 1 Mod OT Treatments $Self Care/Home Management : 23-37 mins G-Codes:      Jeri Modena   OTR/L Pager: [phone] Office: [phone] .   Parke Poisson B 01/17/2018, 4:08 PM

## 2018-01-17 NOTE — Progress Notes (Signed)
Pharmacy Antibiotic Note  David Lane is a 72 y.o. male admitted on 01/12/2018 with cellulitis, abscess, and osteomyelitis.  Pharmacy has been consulted for Vancomycin dosing. Pt is now s/p amputation. Vancomycin trough within therapeutic range. Will order a daily Scr while on vancomycin-last one on 4/21.   Plan: -Cont vancomycin 1000 mg IV q12h -Re-check trough in 4-5 doses or with any changes in renal function   Height: 6' (182.9 cm) Weight: 196 lb (88.9 kg) IBW/kg (Calculated) : 77.6  Temp (24hrs), Avg:98.1 F (36.7 C), Min:98 F (36.7 C), Max:98.2 F (36.8 C)  Recent Labs  Lab 01/12/18 1550 01/12/18 1559 01/13/18 0626 01/15/18 0708 01/17/18 2258  WBC 18.2*  --  14.8* 10.1  --   CREATININE 1.38*  --  1.23 1.20  --   LATICACIDVEN  --  1.17  --   --   --   VANCOTROUGH  --   --   --   --  20    Estimated Creatinine Clearance: 63.8 mL/min (by C-G formula based on SCr of 1.2 mg/dL).    Allergies  Allergen Reactions  . Lipitor [Atorvastatin] Other (See Comments)    Muscle weakness  . Metformin And Related Diarrhea    Severe diarrhea    [name] 01/17/2018 11:55 PM

## 2018-01-17 NOTE — Progress Notes (Signed)
Pharmacy Antibiotic Note  David Lane is a 72 y.o. male admitted on 01/12/2018 with sepsis due to cellulitis. Blood cultures are negative. Abscess culture showing moderate GNR. Pt is currently on Vanc + Zosyn day 6.  Plan: Recommend d/c vancomycin. In the meantime, will obtain vancomycin trough @ 11:30pm. Vancomycin IV 1,000 mg every 12 hours. Con't Zosyn IV 3.375 g every 8 hours. Monitor renal function and culture data.  Height: 6' (182.9 cm) Weight: 196 lb (88.9 kg) IBW/kg (Calculated) : 77.6  Temp (24hrs), Avg:98.1 F (36.7 C), Min:98 F (36.7 C), Max:98.2 F (36.8 C)  Recent Labs  Lab 01/12/18 1550 01/12/18 1559 01/13/18 0626 01/15/18 0708  WBC 18.2*  --  14.8* 10.1  CREATININE 1.38*  --  1.23 1.20  LATICACIDVEN  --  1.17  --   --     Estimated Creatinine Clearance: 63.8 mL/min (by C-G formula based on SCr of 1.2 mg/dL).    Allergies  Allergen Reactions  . Lipitor [Atorvastatin] Other (See Comments)    Muscle weakness  . Metformin And Related Diarrhea    Severe diarrhea    Antimicrobials this admission: 4/18 Vancomycin 1 g >>  4/18 Zosyn 3.375 g >>   Dose adjustments this admission:   Microbiology results: 4/18 blood culture: negative (final) 4/20 abscess left foot culture: moderate gram negative rods  Thank you for allowing pharmacy to be a part of this patient's care.  Julieta Bellini 01/17/2018 1:37 PM

## 2018-01-17 NOTE — Progress Notes (Signed)
Pt concerned that he is not receiving lantus and his blood sugars are not controlled. His blood sugars are in the 300.  Pt has been shaking, shivering, diaphoretic to the point his sheets have been completely soaked and changed twice during this shift.   Followed up with Dr. Kennon Holter.

## 2018-01-17 NOTE — Progress Notes (Addendum)
Patient ID: David Lane, male   DOB: [birth year], 72 y.o.   MRN: [id number]  PROGRESS NOTE    David Lane  ZMO:[id number] DOB: 10-Aug-1946 DOA: 01/12/2018  PCP: Dorena Cookey, MD   Brief Narrative:  72 year old male with past medical history significant forCADwith recent STEMI (3/26-31) s/p stenton Brilinta, HTN, HLD; combined CHF(EF 35-40% with grade 1 diastolic dysfunction on 0/35), DM who presented with cellulitisand left foot wound. Started on vanco and zosyn. Seen by Dr. Sharol Given due to concern for necrotizing infection and need for amputation. Pt underwent left transmetatarsal amputation 01/14/2018.   Assessment & Plan:   Principal Problem: Sepsis arthritis of the first MTP joint  ([place]) / Diabetic insensate neuropathy with abscess and osteomyelitis of the left foot - MRI left foot showed soft tissue wound along the plantar and dorsal medial aspect of the forefoot at the level of the MTP joint with surrounding cellulitis. Wound extends deep to the first MTP joint capsule. Septic arthritis of the first MTP joint with adjacent marrow edema. 9 x 22 mm fluid collection along the plantar aspect of the first proximal phalanx extending into the medial and lateral soft tissues most concerning for an abscess and extension of an abscess intermixed with necrotic soft tissue concerning for a necrotizing infection.  - X ray of the left foot showed indings consistent wifth cellulitis centered about the right great toe and first metatarsal. No plain film evidence of osteomyelitis. Moderate right first MTP osteoarthritis - Started on vanco and zosyn - S/P transmetatarsal amputation 01/14/18 by Dr. Meridee Score - Blood cx negative so far - Continue wound vac - Follow up on inpatient rehab recommendations    Active Problems:   Hyperlipidemia - Continue Crestor     Essential hypertension - Continue carvedilol 3.125 mg 2 times daily     Chronic combined systolic and diastolic CHF  (congestive heart failure) ([place]) - Compensated     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 (HCC) - Pt expressed his concern that CBG's are in 400 overnight. I have reviewed CBG's over past 72 hours and the highest recorded was in 390 range - He asked to have his regimen Lantus 50 units in am and 30 units at bedtime. I tried to explain we can use regimen recommended by DM coordinator but he said on outpt basis this was how his PCP recommended insulin regimen  - Monitor CBG's    Status post coronary artery stent placement - No chest pain    DVT prophylaxis: Brilinta   Code Status: full code  Family Communication: no family at bedside Disposition Plan:  waiting rehab recommendations    Consultants:   Orthopedic surgery   Procedures:  Transmetatarsal amputation 01/14/2018  Wound vac 4/20 -->  Antimicrobials:   Vanco and zosyn 4/18 -->   Subjective: Sweating overnight.  Objective: Vitals:   01/16/18 1208 01/16/18 2110 01/17/18 0410 01/17/18 1000  BP: 106/68 120/66 (!) 145/76 (!) 159/84  Pulse: 84 78 83 82  Resp: 16 16 20 20   Temp: 97.8 F (36.6 C) 98.1 F (36.7 C) 98 F (36.7 C) 98.2 F (36.8 C)  TempSrc: Oral Oral Oral Oral  SpO2: 100% 97% 98% 99%  Weight:      Height:        Intake/Output Summary (Last 24 hours) at 01/17/2018 1249 Last data filed at 01/17/2018 1000 Gross per 24 hour  Intake 3500 ml  Output 1775 ml  Net 1725 ml   [employer]  01/13/18 1301  Weight: 88.9 kg (196 lb)     Physical Exam  Constitutional: Appears well-developed and well-nourished. No distress.  CVS: RRR, S1/S2 + Pulmonary: Effort and breath sounds normal, no stridor, rhonchi, wheezes, rales.  Abdominal: Soft. BS +,  no distension, tenderness, rebound or guarding.  Musculoskeletal: Normal range of motion. No edema and no tenderness.  Lymphadenopathy: No lymphadenopathy noted, cervical, inguinal. Neuro: Alert. Normal reflexes, muscle tone coordination. No  cranial nerve deficit. Skin: Left foot wound vac with dressing on left foot Psychiatric: Normal mood and affect. Behavior, judgment, thought content normal.    Data Reviewed: I have personally reviewed following labs and imaging studies  CBC: Recent Labs  Lab 01/12/18 1550 01/13/18 0626 01/15/18 0708  WBC 18.2* 14.8* 10.1  NEUTROABS 14.3*  --   --   HGB 10.6* 9.7* 9.3*  HCT 31.2* 28.6* 28.5*  MCV 88.1 88.0 90.2  PLT 316 308 505   Basic Metabolic Panel: Recent Labs  Lab 01/12/18 1550 01/13/18 0626 01/15/18 0708  NA 131* 135 134*  K 3.4* 3.7 4.3  CL 98* 104 101  CO2 22 22 23   GLUCOSE 64* 136* 296*  BUN 20 18 13   CREATININE 1.38* 1.23 1.20  CALCIUM 8.0* 7.7* 7.9*   GFR: Estimated Creatinine Clearance: 63.8 mL/min (by C-G formula based on SCr of 1.2 mg/dL). Liver Function Tests: Recent Labs  Lab 01/12/18 1550  AST 38  ALT 26  ALKPHOS 71  BILITOT 1.6*  PROT 6.4*  ALBUMIN 2.3*   No results for input(s): LIPASE, AMYLASE in the last 168 hours. No results for input(s): AMMONIA in the last 168 hours. Coagulation Profile: No results for input(s): INR, PROTIME in the last 168 hours. Cardiac Enzymes: No results for input(s): CKTOTAL, CKMB, CKMBINDEX, TROPONINI in the last 168 hours. BNP (last 3 results) No results for input(s): PROBNP in the last 8760 hours. HbA1C: No results for input(s): HGBA1C in the last 72 hours. CBG: Recent Labs  Lab 01/16/18 2109 01/17/18 0055 01/17/18 0441 01/17/18 0654 01/17/18 1143  GLUCAP 293* 386* 340* 250* 296*   Lipid Profile: No results for input(s): CHOL, HDL, LDLCALC, TRIG, CHOLHDL, LDLDIRECT in the last 72 hours. Thyroid Function Tests: No results for input(s): TSH, T4TOTAL, FREET4, T3FREE, THYROIDAB in the last 72 hours. Anemia Panel: No results for input(s): VITAMINB12, FOLATE, FERRITIN, TIBC, IRON, RETICCTPCT in the last 72 hours. Urine analysis:    Component Value Date/Time   COLORURINE YELLOW 11/25/2014 1339    APPEARANCEUR CLEAR 11/25/2014 1339   LABSPEC 1.020 11/25/2014 1339   PHURINE 5.5 11/25/2014 1339   GLUCOSEU >=1000 (A) 11/25/2014 1339   HGBUR TRACE-LYSED (A) 11/25/2014 1339   HGBUR negative 08/03/2010 0919   BILIRUBINUR NEGATIVE 11/25/2014 1339   KETONESUR NEGATIVE 11/25/2014 1339   PROTEINUR NEGATIVE 10/18/2012 1358   UROBILINOGEN 0.2 11/25/2014 1339   NITRITE NEGATIVE 11/25/2014 1339   LEUKOCYTESUR NEGATIVE 11/25/2014 1339   Sepsis Labs: @LABRCNTIP (procalcitonin:4,lacticidven:4)    Recent Results (from the past 240 hour(s))  Blood Culture (routine x 2)     Status: None   Collection Time: 01/12/18  3:50 PM  Result Value Ref Range Status   Specimen Description BLOOD RIGHT ANTECUBITAL  Final   Special Requests   Final    BOTTLES DRAWN AEROBIC AND ANAEROBIC Blood Culture adequate volume   Culture   Final    NO GROWTH 5 DAYS Performed at [facility], [place] [address] [place], [place] [address]    Report Status 01/17/2018  FINAL  Final  Blood Culture (routine x 2)     Status: None   Collection Time: 01/12/18  4:00 PM  Result Value Ref Range Status   Specimen Description BLOOD LEFT ARM  Final   Special Requests   Final    BOTTLES DRAWN AEROBIC AND ANAEROBIC Blood Culture adequate volume   Culture   Final    NO GROWTH 5 DAYS Performed at [facility], [address] [place], [place] [address]    Report Status 01/17/2018 FINAL  Final  Aerobic/Anaerobic Culture (surgical/deep wound)     Status: None (Preliminary result)   Collection Time: 01/14/18 10:03 PM  Result Value Ref Range Status   Specimen Description ABSCESS LEFT FOOT  Final   Special Requests PATIENT ON FOLLOWING VANCOMYCIN,ZOSYN  Final   Gram Stain   Final    ABUNDANT WBC PRESENT, PREDOMINANTLY PMN MODERATE GRAM NEGATIVE RODS FEW GRAM POSITIVE COCCI IN PAIRS Performed at [facility], [place] [address], [place], [place] [address]    Culture PENDING  Incomplete   Report Status PENDING   Incomplete      Radiology Studies: Mr Foot Left W Wo Contrast Result Date: 01/13/2018 1. Soft tissue wound along the plantar and dorsal medial aspect of the forefoot at the level of the MTP joint with surrounding cellulitis. Wound extends deep to the first MTP joint capsule. Septic arthritis of the first MTP joint with adjacent marrow edema. 9 x 22 mm fluid collection along the plantar aspect of the first proximal phalanx extending into the medial and lateral soft tissues most concerning for an abscess. Large areas of hypoenhancing soft tissue along the dorsal medial aspect of the forefoot extending into the first web space with multiple tiny foci of low signal concerning for extension of an abscess intermixed with necrotic soft tissue concerning for a necrotizing infection. Electronically Signed   By: Kathreen Devoid   On: 01/13/2018 13:28   Dg Chest Port 1 View Result Date: 01/12/2018 No acute cardiopulmonary process.   Dg Foot Complete Left Result Date: 01/12/2018 Findings consistent with cellulitis centered about the right great toe and first metatarsal. No plain film evidence of osteomyelitis. Moderate right first MTP osteoarthritis.   Scheduled Meds: .  aspirin  81 mg Oral Daily  .  carvedilol  3.125 mg Oral BID WC  .  docusate sodium  100 mg Oral BID  . insulin aspart  0-5 Units Subcutaneous QHS  .  insulin aspart  0-9 Units Subcutaneous TID WC  .  pantoprazole  40 mg Oral Daily  .  rosuvastatin  40 mg Oral QHS  .  ticagrelor  90 mg Oral BID   Continuous Infusions: . sodium chloride 10 mL/hr at 01/16/18 0004  . lactated ringers 75 mL/hr at 01/14/18 1656  . methocarbamol (ROBAXIN)  IV    . piperacillin-tazobactam (ZOSYN)  IV 3.375 g (01/17/18 0942)  . vancomycin Stopped (01/17/18 0038)     LOS: 5 days    Time spent: 25 minutes  Greater than 50% of the time spent on counseling and coordinating the care.   Leisa Lenz, MD [facility] Pager [phone]  If 7PM-7AM,  please contact night-coverage [email or web address] Password [facility] 01/17/2018, 12:49 PM

## 2018-01-18 LAB — BASIC METABOLIC PANEL
ANION GAP: 9 (ref 5–15)
BUN: 16 mg/dL (ref 6–20)
CO2: 26 mmol/L (ref 22–32)
Calcium: 8 mg/dL — ABNORMAL LOW (ref 8.9–10.3)
Chloride: 101 mmol/L (ref 101–111)
Creatinine, Ser: 1.24 mg/dL (ref 0.61–1.24)
GFR calc Af Amer: >60 mL/min (ref >60)
GFR, EST NON AFRICAN AMERICAN: 58 mL/min — AB (ref >60)
Glucose, Bld: 250 mg/dL — ABNORMAL HIGH (ref 65–99)
POTASSIUM: 4 mmol/L (ref 3.5–5.1)
SODIUM: 136 mmol/L (ref 135–145)

## 2018-01-18 LAB — GLUCOSE, CAPILLARY
GLUCOSE-CAPILLARY: 238 mg/dL — AB (ref 65–99)
GLUCOSE-CAPILLARY: 134 mg/dL — AB (ref 65–99)
GLUCOSE-CAPILLARY: 110 mg/dL — AB (ref 65–99)
GLUCOSE-CAPILLARY: 97 mg/dL (ref 65–99)

## 2018-01-18 MED ORDER — INSULIN ASPART 100 UNIT/ML ~~LOC~~ SOLN
7.0000 [IU] | Freq: Three times a day (TID) | SUBCUTANEOUS | Status: DC
  Administered 2018-01-18 – 2018-01-20 (×9): 7 [IU] via SUBCUTANEOUS

## 2018-01-18 NOTE — Progress Notes (Signed)
Physical Therapy Treatment Patient Details Name: David Lane MRN: [id number] DOB: [birth year] Today's Date: 01/18/2018    History of Present Illness 72 year old male with past medical history significant forCADwith recent STEMI (3/26-31) s/p stenton Brilinta, HTN, HLD; combined CHF(EF 35-40% with grade 1 diastolic dysfunction on 6/43), DM who presented with cellulitisand left foot wound.W/up revealed osteomyelitis.  Dr. Sharol Given did transmet amputation on 01/14/18 .      PT Comments    Patient progressing with therapy today. Pt reports no longer being dizzy and eager to work with therapy. Pt has questions over whether he is heading to CIR or SNF. Pt able to ambulate further distances today with 1 seated rest break. Chair follow for safety as patient is deconditioned and exerting himself with single leg stance and hop gait. Per chart CIR has no availability, updating recs to SNF which well benefit patient at this point to regain independence before safe return home.    Follow Up Recommendations  SNF;Supervision/Assistance - 24 hour     Equipment Recommendations  Rolling walker with 5" wheels    Recommendations for Other Services       Precautions / Restrictions Precautions Precautions: Fall Precaution Comments: pt with would vac present and amputation of midfoot on L Restrictions Weight Bearing Restrictions: Yes LUE Weight Bearing: Non weight bearing    Mobility  Bed Mobility               General bed mobility comments: OOB at entry  Transfers Overall transfer level: Needs assistance Equipment used: Rolling walker (2 wheeled) Transfers: Sit to/from Stand Sit to Stand: Min guard         General transfer comment: pt requires cues for safety and to static stand prior to attempting to tranfsre. pt fall risk at this time  Ambulation/Gait Ambulation/Gait assistance: Min guard Ambulation Distance (Feet): 40 Feet Assistive device: Rolling walker (2 wheeled) Gait  Pattern/deviations: Step-to pattern Gait velocity: decreased   General Gait Details: patient with hop to gait good adherence to NWB status. 1 LOB this visit, min A to stabilize. chair follow for safety due to fatigue   Stairs             Wheelchair Mobility    Modified Rankin (Stroke Patients Only)       Balance             Standing balance-Leahy Scale: Fair                              Cognition Arousal/Alertness: Awake/alert Behavior During Therapy: WFL for tasks assessed/performed Overall Cognitive Status: Within Functional Limits for tasks assessed                                        Exercises      General Comments        Pertinent Vitals/Pain Pain Assessment: No/denies pain    Home Living                      Prior Function            PT Goals (current goals can now be found in the care plan section) Acute Rehab PT Goals [name] PT Goal Formulation: With patient Time For Goal Achievement: 01/29/18 Potential to Achieve Goals: Good Progress towards  PT goals: Progressing toward goals    Frequency    Min 3X/week      PT Plan Discharge plan needs to be updated    Co-evaluation              AM-PAC PT "6 Clicks" Daily Activity  Outcome Measure  Difficulty turning over in bed (including adjusting bedclothes, sheets and blankets)?: None Difficulty moving from lying on back to sitting on the side of the bed? : None Difficulty sitting down on and standing up from a chair with arms (e.g., wheelchair, bedside commode, etc,.)?: A Little Help needed moving to and from a bed to chair (including a wheelchair)?: A Little Help needed walking in hospital room?: A Lot Help needed climbing 3-5 steps with a railing? : Total 6 Click Score: 17    End of Session Equipment Utilized During Treatment: Gait belt Activity Tolerance: Patient tolerated treatment well Patient  left: in chair;with call bell/phone within reach Nurse Communication: Mobility status PT Visit Diagnosis: Unsteadiness on feet (R26.81);Other abnormalities of gait and mobility (R26.89)     Time: 4497-5300 PT Time Calculation (min) (ACUTE ONLY): 31 min  Charges:  $Gait Training: 8-22 mins                    G Codes:       Reinaldo Berber, PT, DPT Acute Rehab Services Pager: [phone]     Reinaldo Berber 01/18/2018, 3:50 PM

## 2018-01-18 NOTE — Progress Notes (Signed)
Patient ID: David Lane, male   DOB: 02-24-1946, 72 y.o.   MRN: [id number]  PROGRESS NOTE    David Lane  ZHY:[id number] DOB: 1946/07/04 DOA: 01/12/2018  PCP: Dorena Cookey, MD   Brief Narrative:  72 year old male with past medical history significant forCADwith recent STEMI (3/26-31) s/p stenton Brilinta, HTN, HLD; combined CHF(EF 35-40% with grade 1 diastolic dysfunction on 2/95), DM who presented with cellulitisand left foot wound. Started on vanco and zosyn. Seen by Dr. Sharol Given due to concern for necrotizing infection and need for amputation. Pt underwent left transmetatarsal amputation 01/14/2018.   Assessment & Plan:   Principal Problem: Sepsis arthritis of the first MTP joint  ([place]) / Diabetic insensate neuropathy with abscess and osteomyelitis of the left foot - MRI left foot showed soft tissue wound along the plantar and dorsal medial aspect of the forefoot at the level of the MTP joint with surrounding cellulitis. Wound extends deep to the first MTP joint capsule. Septic arthritis of the first MTP joint with adjacent marrow edema. 9 x 22 mm fluid collection along the plantar aspect of the first proximal phalanx extending into the medial and lateral soft tissues most concerning for an abscess and extension of an abscess intermixed with necrotic soft tissue concerning for a necrotizing infection.  - X ray of the left foot showed indings consistent wifth cellulitis centered about the right great toe and first metatarsal. No plain film evidence of osteomyelitis. Moderate right first MTP osteoarthritis - Started on vanco and zosyn - S/P transmetatarsal amputation 01/14/18 by Dr. 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 (HCC) - On Lantus 50 units in am and 30 units at bedtime - Add novolog 7 units TID for better glycemic control - Continue SSI - CBG's: 372, 238, 134    Status post coronary artery stent placement - No reports of chest pain     DVT prophylaxis: Continue Brilinta Code Status: full code  Family Communication: no family at bedside Disposition Plan:  To inpatient rehab depending on recommendations and bed availability    Consultants:   Orthopedic surgery   Procedures:  Transmetatarsal amputation 01/14/2018  Wound vac 4/20 -->  Antimicrobials:   Vanco and zosyn 4/18 -->   Subjective: No overnight events.  Objective: Vitals:   01/17/18 1000 01/17/18 1346 01/17/18 1950 01/18/18 0550  BP: (!) 159/84 135/83 122/75 117/60  Pulse: 82 78 87 75  Resp: 20 18 18 18   Temp: 98.2 F (36.8 C) 98.1 F (36.7 C) 98.2 F (36.8 C) 98.1 F (36.7 C)  TempSrc: Oral Oral Oral Oral  SpO2: 99% 100% 100% 96%  Weight:      Height:        Intake/Output Summary (Last 24 hours) at 01/18/2018 1219 Last data filed at 01/18/2018 0830 Gross per 24 hour  Intake 720 ml  Output 400 ml  Net 320 ml   [employer]   01/13/18 1301  Weight: 88.9 kg (196 lb)     Physical Exam  Constitutional: Appears well-developed and well-nourished. No distress.  CVS: RRR, S1/S2 +, no murmurs, no gallops, no carotid  bruit.  Pulmonary: Effort and breath sounds normal, no stridor, rhonchi, wheezes, rales.  Abdominal: Soft. BS +,  no distension, tenderness, rebound or guarding.  Musculoskeletal: Normal range of motion. No edema and no tenderness.  Lymphadenopathy: No lymphadenopathy noted, cervical, inguinal. Neuro: Alert. Normal reflexes, muscle tone coordination. No cranial nerve deficit. Skin: Left foot wound vac in place Psychiatric: Normal mood and affect. Behavior, judgment, thought  content normal.     Data Reviewed: I have personally reviewed following labs and imaging studies  CBC: Recent Labs  Lab 01/12/18 1550 01/13/18 0626 01/15/18 0708  WBC 18.2* 14.8* 10.1  NEUTROABS 14.3*  --   --   HGB 10.6* 9.7* 9.3*  HCT 31.2* 28.6* 28.5*  MCV 88.1 88.0 90.2  PLT 316 308 440   Basic Metabolic Panel: Recent Labs  Lab 01/12/18 1550 01/13/18 0626 01/15/18 0708 01/18/18 0616  NA 131* 135 134* 136  K 3.4* 3.7 4.3 4.0  CL 98* 104 101 101  CO2 22 22 23 26   GLUCOSE 64* 136* 296* 250*  BUN 20 18 13 16   CREATININE 1.38* 1.23 1.20 1.24  CALCIUM 8.0* 7.7* 7.9* 8.0*   GFR: Estimated Creatinine Clearance: 61.7 mL/min (by C-G formula based on SCr of 1.24 mg/dL). Liver Function Tests: Recent Labs  Lab 01/12/18 1550  AST 38  ALT 26  ALKPHOS 71  BILITOT 1.6*  PROT 6.4*  ALBUMIN 2.3*   No results for input(s): LIPASE, AMYLASE in the last 168 hours. No results for input(s): AMMONIA in the last 168 hours. Coagulation Profile: No results for input(s): INR, PROTIME in the last 168 hours. Cardiac Enzymes: No results for input(s): CKTOTAL, CKMB, CKMBINDEX, TROPONINI in the last 168 hours. BNP (last 3 results) No results for input(s): PROBNP in the last 8760 hours. HbA1C: No results for input(s): HGBA1C in the last 72 hours. CBG: Recent Labs  Lab 01/17/18 1622 01/17/18 1948 01/17/18 2226 01/18/18 0753 01/18/18 1113  GLUCAP 326* 342* 372* 238* 134*   Lipid Profile: No results for input(s): CHOL, HDL, LDLCALC, TRIG, CHOLHDL, LDLDIRECT in the last 72 hours. Thyroid Function Tests: No results for input(s): TSH, T4TOTAL, FREET4, T3FREE, THYROIDAB in the last 72 hours. Anemia Panel: No results for input(s): VITAMINB12, FOLATE, FERRITIN, TIBC, IRON, RETICCTPCT in the last 72 hours. Urine analysis:    Component Value Date/Time   COLORURINE YELLOW 11/25/2014 1339   APPEARANCEUR CLEAR 11/25/2014 1339   LABSPEC 1.020 11/25/2014 1339   PHURINE 5.5 11/25/2014  1339   GLUCOSEU >=1000 (A) 11/25/2014 1339   HGBUR TRACE-LYSED (A) 11/25/2014 1339   HGBUR negative 08/03/2010 0919   BILIRUBINUR NEGATIVE 11/25/2014 1339   KETONESUR NEGATIVE 11/25/2014 1339   PROTEINUR NEGATIVE 10/18/2012 1358   UROBILINOGEN 0.2 11/25/2014 1339   NITRITE NEGATIVE 11/25/2014 1339   LEUKOCYTESUR NEGATIVE 11/25/2014 1339   Sepsis Labs: @LABRCNTIP (procalcitonin:4,lacticidven:4)    Recent Results (from the past 240 hour(s))  Blood Culture (routine x 2)     Status: None   Collection Time: 01/12/18  3:50 PM  Result Value Ref Range Status   Specimen Description BLOOD RIGHT ANTECUBITAL  Final   Special Requests   Final    BOTTLES DRAWN AEROBIC AND ANAEROBIC Blood Culture adequate volume   Culture   Final    NO GROWTH 5 DAYS Performed at [facility], [place] [address], [place], [place] [address]    Report Status 01/17/2018 FINAL  Final  Blood Culture (routine x 2)     Status: None  Collection Time: 01/12/18  4:00 PM  Result Value Ref Range Status   Specimen Description BLOOD LEFT ARM  Final   Special Requests   Final    BOTTLES DRAWN AEROBIC AND ANAEROBIC Blood Culture adequate volume   Culture   Final    NO GROWTH 5 DAYS Performed at [facility], [address], [place], [place] [address]    Report Status 01/17/2018 FINAL  Final  Aerobic/Anaerobic Culture (surgical/deep wound)     Status: None (Preliminary result)   Collection Time: 01/14/18 10:03 PM  Result Value Ref Range Status   Specimen Description ABSCESS LEFT FOOT  Final   Special Requests PATIENT ON FOLLOWING VANCOMYCIN,ZOSYN  Final   Gram Stain   Final    ABUNDANT WBC PRESENT, PREDOMINANTLY PMN MODERATE GRAM NEGATIVE RODS FEW GRAM POSITIVE COCCI IN PAIRS    Culture   Final    MODERATE ESCHERICHIA COLI SUSCEPTIBILITIES TO FOLLOW Performed at [facility], [place] [address] [place], [place] [address]    Report Status PENDING  Incomplete      Radiology Studies: Mr Foot Left W  Wo Contrast Result Date: 01/13/2018 1. Soft tissue wound along the plantar and dorsal medial aspect of the forefoot at the level of the MTP joint with surrounding cellulitis. Wound extends deep to the first MTP joint capsule. Septic arthritis of the first MTP joint with adjacent marrow edema. 9 x 22 mm fluid collection along the plantar aspect of the first proximal phalanx extending into the medial and lateral soft tissues most concerning for an abscess. Large areas of hypoenhancing soft tissue along the dorsal medial aspect of the forefoot extending into the first web space with multiple tiny foci of low signal concerning for extension of an abscess intermixed with necrotic soft tissue concerning for a necrotizing infection. Electronically Signed   By: Kathreen Devoid   On: 01/13/2018 13:28   Dg Chest Port 1 View Result Date: 01/12/2018 No acute cardiopulmonary process.   Dg Foot Complete Left Result Date: 01/12/2018 Findings consistent with cellulitis centered about the right great toe and first metatarsal. No plain film evidence of osteomyelitis. Moderate right first MTP osteoarthritis.   Scheduled Meds: .  aspirin  81 mg Oral Daily  .  carvedilol  3.125 mg Oral BID WC  .  docusate sodium  100 mg Oral BID  . insulin aspart  0-5 Units Subcutaneous QHS  .  insulin aspart  0-9 Units Subcutaneous TID WC  .  pantoprazole  40 mg Oral Daily  .  rosuvastatin  40 mg Oral QHS  .  ticagrelor  90 mg Oral BID   Continuous Infusions: . sodium chloride 10 mL/hr at 01/16/18 0004  . lactated ringers 75 mL/hr at 01/14/18 1656  . methocarbamol (ROBAXIN)  IV    . piperacillin-tazobactam (ZOSYN)  IV Stopped (01/18/18 1212)  . vancomycin 1,000 mg (01/18/18 1212)     LOS: 6 days    Time spent: 25 minutes  Greater than 50% of the time spent on counseling and coordinating the care.   Leisa Lenz, MD [facility] Pager [phone]  If 7PM-7AM, please contact  night-coverage [email or web address] Password [facility] 01/18/2018, 12:19 PM

## 2018-01-18 NOTE — Progress Notes (Signed)
Inpatient Diabetes Program Recommendations  AACE/ADA: New Consensus Statement on Inpatient Glycemic Control (2015)  Target Ranges:  Prepandial:   less than 140 mg/dL      Peak postprandial:   less than 180 mg/dL (1-2 hours)      Critically ill patients:  140 - 180 mg/dL   Lab Results  Component Value Date   GLUCAP 134 (H) 01/18/2018   HGBA1C 10.0 (H) 12/21/2017    Review of Glycemic Control Results for David Lane, David Lane (MRN [id number]) as of 01/18/2018 11:50  Ref. Range 01/17/2018 19:48 01/17/2018 22:26 01/18/2018 07:53 01/18/2018 11:13  Glucose-Capillary Latest Ref Range: 65 - 99 mg/dL 342 (H) 372 (H) 238 (H) 134 (H)   Diabetes history: Type 1 DM Outpatient Diabetes medications:Lantus 80 units QHS Current orders for Inpatient glycemic control: Novolog 0-5 units QHS, Novolog 0-9 units TID, Lantus 30 units QHS, Lantus 50 units QD, Novolog 7 units TID  Inpatient Diabetes Program Recommendations:    Spoke to patient again in attempt to determine outpatient plan. Confirmed with patient that he is type 1 DM. Patient will need basal insulin at discharge. Patient has experienced multiple lows on total of Lantus 80 units QHS. Noted changes to insulin orders to include Lantus 30 units QHS, 50 units QD and Novolog 7 units TID. BS look improved with this regimen inpatient today. Will continue to watch.   Per patient, he has been in doughnut hole and has spent >500$ out of pocket for insulins. Patient is frustrated and finds difficulty in establishing trust with health care providers. Educated and encouraged to continue communication and find a health care provider he can reach out to with concerns. Patient has had issues with reaching out and communicating problems with providers in past and has consequently had multiple episodes of hypoglycemia. Thus, this patient is high risk at discharge with diabetes management. Unsure of ability to self manage diabetes without regard to provider/health care  advice.  Had previously given patient a co-pay card (on 4/19) assuming that he had private insurance. However, realized that this was not applicable to patient because of the Medicare portion. Educated patient of this change and informed that we had to make further decisions. Discussed Novolin 70/30 as an option for patient. Educated patient that he has to eat a meal with injections, but that this may be a more affordable option. Patient is opting to think about it. Provider paged to discuss.    Thanks, Bronson Curb, MSN, RNC-OB Diabetes Coordinator [phone] (8a-5p)

## 2018-01-18 NOTE — NC FL2 (Signed)
[place] LEVEL OF CARE SCREENING TOOL     IDENTIFICATION  Patient Name: David Lane Reason: 20-Dec-1945 Sex: male Admission Date (Current Location): 01/12/2018  [facility] and [place] Number:  Herbalist and Address:  [facility], [place] [address], [place], [place] [address]      Provider Number: [id number]  Attending Physician Name and Address:  Robbie Lis, MD  Relative Name and Phone Number:  Vertell Limber, sister, [phone]    Current Level of Care: Hospital Recommended Level of Care: [place] Prior Approval Number:    Date Approved/Denied:   PASRR Number: [id number]  Discharge Plan: SNF    Current Diagnoses: Patient Active Problem List   Diagnosis Date Noted  . Cutaneous abscess of left foot   . Subacute osteomyelitis, left ankle and foot ([place])   . Sepsis due to cellulitis ([place]) 01/12/2018  . Foot ulcer due to secondary DM ([place]) 01/04/2018  . Status post coronary artery stent placement   . Acute ST elevation myocardial infarction (STEMI) involving left anterior descending (LAD) coronary artery ([place]) 12/20/2017  . 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 Wellness examination 11/25/2014  . Screening for prostate cancer 11/25/2014  . Pancreatic insufficiency 01/25/2013  . Ischemic cardiomyopathy, EF 50% by echo 08/01/13 07/19/2012  . Chronic combined systolic and diastolic CHF (congestive heart failure) ([place]) 07/14/2012  . Pulmonary edema, most likely due to diastolic dysfunction 63/14/9702  . CAD, 07/14/12- LAD/PDA DES   . ANXIETY 12/12/2009  . Hyperlipidemia 04/24/2007  . Essential hypertension 04/24/2007  . PANCREATITIS, HX OF 04/24/2007    Orientation RESPIRATION BLADDER Height & Weight     Self, Time, Situation, Place  Normal Continent Weight: 196 lb (88.9 kg) Height:  6' (182.9 cm)  BEHAVIORAL SYMPTOMS/MOOD NEUROLOGICAL BOWEL NUTRITION STATUS   Continent Diet(please see DC summary)  AMBULATORY STATUS COMMUNICATION OF NEEDS Skin   Limited Assist(min to mod assist) Verbally Surgical wounds(closed incision L leg, negative pressure wound therapy)                       Personal Care Assistance Level of Assistance  Feeding, Bathing, Dressing Bathing Assistance: Limited assistance(min to mod) Feeding assistance: Independent Dressing Assistance: Limited assistance(min to mod)     Functional Limitations Info  Sight, Hearing, Speech Sight Info: Adequate Hearing Info: Adequate Speech Info: Adequate    SPECIAL CARE FACTORS FREQUENCY  PT (By licensed PT), OT (By licensed OT)     PT Frequency: 5x/week OT Frequency: 5x/week            Contractures Contractures Info: Not present    Additional Factors Info  Code Status, Allergies, Insulin Sliding Scale Code Status Info: Full Allergies Info: Lipitor Atorvastatin, Metformin And Related   Insulin Sliding Scale Info: novolog 3x/day with meals and at bedtime, lantus daily       Current Medications (01/18/2018):  This is the current hospital active medication list Current Facility-Administered Medications  Medication Dose Route Frequency Provider Last Rate Last Dose  . 0.9 %  sodium chloride infusion   Intravenous Continuous Newt Minion, MD 10 mL/hr at 01/16/18 0004    . acetaminophen (TYLENOL) tablet 325-650 mg  325-650 mg Oral Q6H PRN Newt Minion, MD   650 mg at 01/14/18 1650  . aspirin chewable tablet 81 mg  81 mg Oral Daily Newt Minion, MD   81 mg at 01/18/18 0805  . bisacodyl (  DULCOLAX) suppository 10 mg  10 mg Rectal Daily PRN Newt Minion, MD      . carvedilol (COREG) tablet 3.125 mg  3.125 mg Oral BID WC Newt Minion, MD   3.125 mg at 01/18/18 0806  . docusate sodium (COLACE) capsule 100 mg  100 mg Oral BID Newt Minion, MD   100 mg at 01/18/18 1610  . feeding supplement (PRO-STAT SUGAR FREE 64) liquid 30 mL  30 mL Oral BID Newt Minion, MD   30 mL at  01/18/18 0804  . HYDROcodone-acetaminophen (NORCO) 7.5-325 MG per tablet 1-2 tablet  1-2 tablet Oral Q4H PRN Newt Minion, MD      . HYDROcodone-acetaminophen (NORCO/VICODIN) 5-325 MG per tablet 1-2 tablet  1-2 tablet Oral Q4H PRN Newt Minion, MD   1 tablet at 01/18/18 1023  . insulin aspart (novoLOG) injection 0-5 Units  0-5 Units Subcutaneous QHS Newt Minion, MD   5 Units at 01/18/18 0126  . insulin aspart (novoLOG) injection 0-9 Units  0-9 Units Subcutaneous TID WC Newt Minion, MD   1 Units at 01/18/18 1207  . insulin aspart (novoLOG) injection 7 Units  7 Units Subcutaneous TID WC Robbie Lis, MD   7 Units at 01/18/18 1208  . insulin glargine (LANTUS) injection 30 Units  30 Units Subcutaneous QHS Robbie Lis, MD   30 Units at 01/17/18 2120  . insulin glargine (LANTUS) injection 50 Units  50 Units Subcutaneous Daily Robbie Lis, MD   50 Units at 01/18/18 0804  . lactated ringers infusion   Intravenous Continuous Newt Minion, MD 75 mL/hr at 01/14/18 1656    . magnesium citrate solution 1 Bottle  1 Bottle Oral Once PRN Newt Minion, MD      . methocarbamol (ROBAXIN) tablet 500 mg  500 mg Oral Q6H PRN Newt Minion, MD       Or  . methocarbamol (ROBAXIN) 500 mg in dextrose 5 % 50 mL IVPB  500 mg Intravenous Q6H PRN Newt Minion, MD      . metoCLOPramide (REGLAN) tablet 5-10 mg  5-10 mg Oral Q8H PRN Newt Minion, MD       Or  . metoCLOPramide (REGLAN) injection 5-10 mg  5-10 mg Intravenous Q8H PRN Newt Minion, MD      . morphine 2 MG/ML injection 0.5-1 mg  0.5-1 mg Intravenous Q2H PRN Newt Minion, MD      . ondansetron [facility]) tablet 4 mg  4 mg Oral Q6H PRN Newt Minion, MD       Or  . ondansetron [facility]) injection 4 mg  4 mg Intravenous Q6H PRN Newt Minion, MD      . pantoprazole (PROTONIX) EC tablet 40 mg  40 mg Oral Daily Newt Minion, MD   40 mg at 01/18/18 0805  . piperacillin-tazobactam (ZOSYN) IVPB 3.375 g  3.375 g Intravenous Q8H Newt Minion,  MD   Stopped at 01/18/18 1212  . polyethylene glycol (MIRALAX / GLYCOLAX) packet 17 g  17 g Oral Daily PRN Newt Minion, MD      . polyvinyl alcohol (LIQUIFILM TEARS) 1.4 % ophthalmic solution 1 drop  1 drop Both Eyes PRN Newt Minion, MD   1 drop at 01/15/18 0912  . rosuvastatin (CRESTOR) tablet 40 mg  40 mg Oral QHS Newt Minion, MD   40 mg at 01/17/18 2120  . ticagrelor (BRILINTA) tablet 90  mg  90 mg Oral BID Newt Minion, MD   90 mg at 01/18/18 4825  . vancomycin (VANCOCIN) IVPB 1000 mg/200 mL premix  1,000 mg Intravenous Q12H Dang, Thuy D, RPH 200 mL/hr at 01/18/18 1212 1,000 mg at 01/18/18 1212     Discharge Medications: Please see discharge summary for a list of discharge medications.  Relevant Imaging Results:  Relevant Lab Results:   Additional Information SSN: [id number]; left transmetatarsal amputation this admission  Estanislado Emms, LCSW

## 2018-01-18 NOTE — Care Management (Signed)
Case manager spoke with Rehab admissions coordinator-Genie Logue, at this time they do no have beds available. Case manager informed social worker of this and asked for SNF to be pursued.

## 2018-01-19 LAB — BASIC METABOLIC PANEL
Anion gap: 7 (ref 5–15)
BUN: 17 mg/dL (ref 6–20)
CHLORIDE: 103 mmol/L (ref 101–111)
CO2: 26 mmol/L (ref 22–32)
CREATININE: 1.21 mg/dL (ref 0.61–1.24)
Calcium: 8 mg/dL — ABNORMAL LOW (ref 8.9–10.3)
GFR calc Af Amer: >60 mL/min (ref >60)
GFR calc non Af Amer: 59 mL/min — ABNORMAL LOW (ref >60)
Glucose, Bld: 237 mg/dL — ABNORMAL HIGH (ref 65–99)
POTASSIUM: 4 mmol/L (ref 3.5–5.1)
Sodium: 136 mmol/L (ref 135–145)

## 2018-01-19 LAB — GLUCOSE, CAPILLARY
Glucose-Capillary: 210 mg/dL — ABNORMAL HIGH (ref 65–99)
Glucose-Capillary: 230 mg/dL — ABNORMAL HIGH (ref 65–99)
Glucose-Capillary: 98 mg/dL (ref 65–99)
Glucose-Capillary: 85 mg/dL (ref 65–99)

## 2018-01-19 NOTE — Social Work (Signed)
CSW provided the patient the SNF list to review and select a SNF. CSW reiterated that once he selects a SNF, we will need insurance auth for him to go there for rehabilitative therapies.  CSW will f/u.  Elissa Hefty, LCSW Clinical Social Worker [phone]

## 2018-01-19 NOTE — Progress Notes (Signed)
Patient ID: David Lane, male   DOB: 1946-08-03, 72 y.o.   MRN: [id number]  PROGRESS NOTE    David Lane  QVZ:[id number] DOB: 05-10-1946 DOA: 01/12/2018  PCP: Dorena Cookey, MD   Brief Narrative:  72 year old male with past medical history significant forCADwith recent STEMI (3/26-31) s/p stenton Brilinta, HTN, HLD; combined CHF(EF 35-40% with grade 1 diastolic dysfunction on 3/29), DM who presented with cellulitisand left foot wound. Started on vanco and zosyn. Seen by Dr. Sharol Given due to concern for necrotizing infection and need for amputation. Pt underwent left transmetatarsal amputation 01/14/2018.   Assessment & Plan:   Principal Problem: Sepsis arthritis of the first MTP joint  ([place]) / Diabetic insensate neuropathy with abscess and osteomyelitis of the left foot - MRI left foot showed soft tissue wound along the plantar and dorsal medial aspect of the forefoot at the level of the MTP joint with surrounding cellulitis. Wound extends deep to the first MTP joint capsule. Septic arthritis of the first MTP joint with adjacent marrow edema. 9 x 22 mm fluid collection along the plantar aspect of the first proximal phalanx extending into the medial and lateral soft tissues most concerning for an abscess and extension of an abscess intermixed with necrotic soft tissue concerning for a necrotizing infection.  - X ray of the left foot showed indings consistent wifth cellulitis centered about the right great toe and first metatarsal. No plain film evidence of osteomyelitis. Moderate right first MTP osteoarthritis - Started on vanco and zosyn - S/P transmetatarsal amputation 01/14/18 by Dr. 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  Objective: Vitals:   01/18/18 0550 01/18/18 1336 01/18/18 2108 01/19/18 0650  BP: 117/60 107/67 110/73 138/85  Pulse: 75 72 84 81  Resp: 18 12 18 18   Temp: 98.1 F (36.7 C) 97.6 F (36.4 C) 98 F (36.7 C) 98 F (36.7 C)  TempSrc: Oral Oral Oral Oral  SpO2: 96% 99% 99% 98%  Weight:      Height:        Intake/Output Summary (Last 24 hours) at 01/19/2018 1204 Last data filed at 01/18/2018 1747 Gross per 24 hour  Intake 480 ml  Output -  Net 480 ml   Filed Weights   01/13/18 1301  Weight: 88.9 kg (196 lb)     Physical Exam  Constitutional: Appears well-developed and well-nourished. No distress.  HENT: Normocephalic. External right and left ear normal. Oropharynx is clear and moist.  Eyes: Conjunctivae and EOM are normal.  PERRLA, no scleral icterus.  Neck: Normal ROM. Neck supple. No JVD. No tracheal deviation. No thyromegaly.  CVS: RRR, S1/S2 +, no murmurs, no gallops, no carotid bruit.  Pulmonary: Effort and breath sounds normal, no stridor, rhonchi, wheezes, rales.  Abdominal: Soft. BS +,  no distension, tenderness, rebound or guarding.  Musculoskeletal: Normal range of motion. No edema and no tenderness.  Lymphadenopathy: No lymphadenopathy noted, cervical, inguinal. Neuro: Alert. Normal reflexes,  muscle tone coordination. No cranial nerve deficit. Skin: wound vac in palce Psychiatric: Normal mood and affect. Behavior, judgment, thought content normal.       Data Reviewed: I have personally reviewed following labs and imaging studies  CBC: Recent Labs  Lab 01/12/18 1550 01/13/18 0626 01/15/18 0708  WBC 18.2* 14.8* 10.1  NEUTROABS 14.3*  --   --   HGB 10.6* 9.7* 9.3*  HCT 31.2* 28.6* 28.5*  MCV 88.1 88.0 90.2  PLT 316 308 881   Basic Metabolic Panel: Recent Labs  Lab 01/12/18 1550 01/13/18 0626 01/15/18 0708 01/18/18 0616 01/19/18 0640  NA 131* 135 134* 136 136  K 3.4* 3.7 4.3 4.0 4.0  CL 98* 104 101 101 103  CO2 22 22 23 26 26   GLUCOSE 64* 136* 296* 250* 237*  BUN 20 18 13 16 17   CREATININE 1.38* 1.23 1.20 1.24 1.21  CALCIUM 8.0* 7.7* 7.9* 8.0* 8.0*   GFR: Estimated Creatinine Clearance: 63.2 mL/min (by C-G formula based on SCr of 1.21 mg/dL). Liver Function Tests: Recent Labs  Lab 01/12/18 1550  AST 38  ALT 26  ALKPHOS 71  BILITOT 1.6*  PROT 6.4*  ALBUMIN 2.3*   No results for input(s): LIPASE, AMYLASE in the last 168 hours. No results for input(s): AMMONIA in the last 168 hours. Coagulation Profile: No results for input(s): INR, PROTIME in the last 168 hours. Cardiac Enzymes: No results for input(s): CKTOTAL, CKMB, CKMBINDEX, TROPONINI in the last 168 hours. BNP (last 3 results) No results for input(s): PROBNP in the last 8760 hours. HbA1C: No results for input(s): HGBA1C in the last 72 hours. CBG: Recent Labs  Lab 01/18/18 1113 01/18/18 1647 01/18/18 2111 01/19/18 0653 01/19/18 1141  GLUCAP 134* 110* 97 210* 230*   Lipid Profile: No results for input(s): CHOL, HDL, LDLCALC, TRIG, CHOLHDL, LDLDIRECT in the last 72 hours. Thyroid Function Tests: No results for input(s): TSH, T4TOTAL, FREET4, T3FREE, THYROIDAB in the last 72 hours. Anemia Panel: No results for input(s): VITAMINB12, FOLATE, FERRITIN, TIBC, IRON, RETICCTPCT in the  last 72 hours. Urine analysis:    Component Value Date/Time   COLORURINE YELLOW 11/25/2014 1339   APPEARANCEUR CLEAR 11/25/2014 1339   LABSPEC 1.020 11/25/2014 1339   PHURINE 5.5 11/25/2014 1339   GLUCOSEU >=1000 (A) 11/25/2014 1339   HGBUR TRACE-LYSED (A) 11/25/2014 1339   HGBUR negative 08/03/2010 0919   BILIRUBINUR NEGATIVE 11/25/2014 1339   KETONESUR NEGATIVE 11/25/2014 1339   PROTEINUR NEGATIVE 10/18/2012 1358   UROBILINOGEN 0.2 11/25/2014 1339   NITRITE NEGATIVE 11/25/2014 1339   LEUKOCYTESUR NEGATIVE 11/25/2014 1339   Sepsis Labs: @LABRCNTIP (procalcitonin:4,lacticidven:4)    Recent Results (from the past 240 hour(s))  Blood Culture (routine x 2)     Status: None   Collection Time: 01/12/18  3:50 PM  Result Value Ref Range Status   Specimen Description BLOOD RIGHT ANTECUBITAL  Final   Special Requests   Final    BOTTLES DRAWN AEROBIC AND ANAEROBIC Blood Culture adequate volume   Culture   Final    NO GROWTH  5 DAYS Performed at [facility], [place] [address] [place], [place] [address]    Report Status 01/17/2018 FINAL  Final  Blood Culture (routine x 2)     Status: None   Collection Time: 01/12/18  4:00 PM  Result Value Ref Range Status   Specimen Description BLOOD LEFT ARM  Final   Special Requests   Final    BOTTLES DRAWN AEROBIC AND ANAEROBIC Blood Culture adequate volume   Culture   Final    NO GROWTH 5 DAYS Performed at [facility], [place] [address] [place], [place] [address]    Report Status 01/17/2018 FINAL  Final  Aerobic/Anaerobic Culture (surgical/deep wound)     Status: None (Preliminary result)   Collection Time: 01/14/18 10:03 PM  Result Value Ref Range Status   Specimen Description ABSCESS LEFT FOOT  Final   Special Requests PATIENT ON FOLLOWING VANCOMYCIN,ZOSYN  Final   Gram Stain   Final    ABUNDANT WBC PRESENT, PREDOMINANTLY PMN MODERATE GRAM NEGATIVE RODS FEW GRAM POSITIVE COCCI IN PAIRS    Culture   Final    MODERATE  ESCHERICHIA COLI HOLDING FOR POSSIBLE ANAEROBE Performed at [facility], [place] [address] [place], [place] [address]    Report Status PENDING  Incomplete   Organism ID, Bacteria ESCHERICHIA COLI  Final      Susceptibility   Escherichia coli - MIC*    AMPICILLIN >=32 RESISTANT Resistant     CEFAZOLIN 16 SENSITIVE Sensitive     CEFEPIME <=1 SENSITIVE Sensitive     CEFTAZIDIME <=1 SENSITIVE Sensitive     CEFTRIAXONE <=1 SENSITIVE Sensitive     CIPROFLOXACIN <=0.25 SENSITIVE Sensitive     GENTAMICIN <=1 SENSITIVE Sensitive     IMIPENEM <=0.25 SENSITIVE Sensitive     TRIMETH/SULFA <=20 SENSITIVE Sensitive     AMPICILLIN/SULBACTAM >=32 RESISTANT Resistant     PIP/TAZO <=4 SENSITIVE Sensitive     Extended ESBL NEGATIVE Sensitive     * MODERATE ESCHERICHIA COLI      Radiology Studies: Mr Foot Left W Wo Contrast Result Date: 01/13/2018 1. Soft tissue wound along the plantar and dorsal medial aspect of the forefoot at the level of the MTP joint with surrounding cellulitis. Wound extends deep to the first MTP joint capsule. Septic arthritis of the first MTP joint with adjacent marrow edema. 9 x 22 mm fluid collection along the plantar aspect of the first proximal phalanx extending into the medial and lateral soft tissues most concerning for an abscess. Large areas of hypoenhancing soft tissue along the dorsal medial aspect of the forefoot extending into the first web space with multiple tiny foci of low signal concerning for extension of an abscess intermixed with necrotic soft tissue concerning for a necrotizing infection. Electronically Signed   By: Kathreen Devoid   On: 01/13/2018 13:28   Dg Chest Port 1 View Result Date: 01/12/2018 No acute cardiopulmonary process.   Dg Foot Complete Left Result Date: 01/12/2018 Findings consistent with cellulitis centered about the right great toe and first metatarsal. No plain film evidence of osteomyelitis. Moderate right first MTP  osteoarthritis.   Scheduled Meds: .  aspirin  81 mg Oral Daily  .  carvedilol  3.125 mg Oral BID WC  .  docusate sodium  100 mg Oral BID  . insulin aspart  0-5 Units Subcutaneous QHS  .  insulin aspart  0-9 Units Subcutaneous TID WC  .  pantoprazole  40 mg Oral Daily  .  rosuvastatin  40 mg Oral QHS  .  ticagrelor  90 mg Oral BID   Continuous Infusions: . sodium chloride 10 mL/hr at 01/16/18 0004  . lactated ringers 75 mL/hr at 01/14/18 1656  . methocarbamol (ROBAXIN)  IV    . piperacillin-tazobactam (ZOSYN)  IV 3.375 g (01/19/18 1006)  . vancomycin Stopped (01/19/18 0018)     LOS: 7 days    Time spent: 25 minutes  Greater than 50% of the time spent on counseling and coordinating the care.   Leisa Lenz, MD [facility] Pager [phone]  If 7PM-7AM, please contact night-coverage [email or web address] Password [facility] 01/19/2018, 12:04 PM

## 2018-01-19 NOTE — Progress Notes (Signed)
Occupational Therapy Treatment Patient Details Name: ABDULAI Lane MRN: [id number] DOB: 18-Mar-1946 Today's Date: 01/19/2018    History of present illness 72 year old male with past medical history significant forCADwith recent STEMI (3/26-31) s/p stenton Brilinta, HTN, HLD; combined CHF(EF 35-40% with grade 1 diastolic dysfunction on 8/56), DM who presented with cellulitisand left foot wound.W/up revealed osteomyelitis.  Dr. Sharol Lane did transmet amputation on 01/14/18 .     OT comments  This 72 yo male admitted and underwent above presents to acute OT with making progress towards goals but is limited currently by wound vac to do a full normal ADL session. He will continue to benefit from acute OT with follow up OT at SNF to get back to his PLOF of being completely independent with basic and IADLs.   Follow Up Recommendations  SNF    Equipment Recommendations  3 in 1 bedside commode       Precautions / Restrictions Precautions Precautions: Fall Precaution Comments: pt with would vac present and amputation of midfoot on L Restrictions Weight Bearing Restrictions: Yes LUE Weight Bearing: Non weight bearing       Mobility Bed Mobility               General bed mobility comments: pt up in recliner upon my entry  Transfers Overall transfer level: Needs assistance Equipment used: Rolling walker (2 wheeled) Transfers: Sit to/from Stand Sit to Stand: Min guard              Balance Overall balance assessment: Needs assistance Sitting-balance support: No upper extremity supported;Feet supported Sitting balance-Leahy Scale: Good     Standing balance support: Single extremity supported Standing balance-Leahy Scale: Poor Standing balance comment: dependent on RW or hand on sink for stability                           ADL either performed or assessed with clinical judgement   ADL Overall ADL's : Needs assistance/impaired                      Lower Body Dressing: Supervision/safety;Set up Lower Body Dressing Details (indicate cue type and reason): min guard A sit<>stand Toilet Transfer: Minimal assistance;Ambulation;RW;BSC Toilet Transfer Details (indicate cue type and reason): over toilet                 Vision Baseline Vision/History: Wears glasses Wears Glasses: Reading only            Cognition Arousal/Alertness: Awake/alert Behavior During Therapy: WFL for tasks assessed/performed Overall Cognitive Status: Within Functional Limits for tasks assessed                                                     Pertinent Vitals/ Pain       Pain Assessment: No/denies pain     Prior Functioning/Environment              Frequency  Min 2X/week        Progress Toward Goals  OT Goals(current goals can now be found in the care plan section)  Progress towards OT goals: Progressing toward goals     Plan Frequency needs to be updated;Discharge plan remains appropriate       AM-PAC PT "6 Clicks" Daily Activity     Outcome Measure  Help from another person eating meals?: None Help from another person taking care of personal grooming?: A Little Help from another person toileting, which includes using toliet, bedpan, or urinal?: A Little Help from another person bathing (including washing, rinsing, drying)?: A Little Help from another person to put on and taking off regular upper body clothing?: A Little Help from another person to put on and taking off regular lower body clothing?: A Little 6 Click Score: 19    End of Session Equipment Utilized During Treatment: Gait belt;Rolling walker  OT Visit Diagnosis: Unsteadiness on feet (R26.81)   Activity Tolerance Patient tolerated treatment well   Patient Left in chair;with call bell/phone within reach   Nurse Communication (asked NT to outside room to ask RN to look at wound vac (all in it is old blood()        Time:  6712-4580 OT Time Calculation (min): 21 min  Charges: OT General Charges $OT Visit: 1 Visit OT Treatments $Self Care/Home Management : 8-22 mins  David Lane, OTR/L [phone] 01/19/2018

## 2018-01-19 NOTE — Clinical Social Work Note (Signed)
Clinical Social Work Assessment  Patient Details  Name: David Lane MRN: [id number] Date of Birth: 02/27/1946  Date of referral:  01/19/18               Reason for consult:  Facility Placement                Permission sought to share information with:  Facility Contact Representative Permission granted to share information::  Yes, Verbal Permission Granted  Name::        Agency::  SNF  Relationship::     Contact Information:     Housing/Transportation Living arrangements for the past 2 months:  Single Family Home Source of Information:  Patient Patient Interpreter Needed:  None Criminal Activity/Legal Involvement Pertinent to Current Situation/Hospitalization:  No - Comment as needed Significant Relationships:  Friend, Other Family Members, Significant Other, Siblings Lives with:  Self Do you feel safe going back to the place where you live?  No Need for family participation in patient care:  No (Coment)  Care giving concerns:  Pt resides alone and will need short term rehab at discharge.  Social Worker assessment / plan:  CSW met with patient at bedside to discuss discharge plan. Pt was hoping for IP Rehab however, they are full and have not available beds. Pt unable to remain in hospital until an IP Rehab bed can be available since his needs can be met at a SNF level of care. Pt unhappy with having to go to SNF. CSW validated and explained the disposition. Pt gave CSW permission to send to SNF's in [place]. Pt has never experienced SNF before. CSW explained the SNF process and placement as well as the Insurance Auth needed to go to a SNF.   CSW will f/u for disposition.  Employment status:  Retired Insurance information:  Managed Medicare PT Recommendations:  Skilled Nursing Facility Information / Referral to community resources:  Skilled Nursing Facility  Patient/Family's Response to care:  Patient not pleased with having to consider SNF for rehab vs. IP Rehab. Pt  thanked CSW for meeting despite not being pleased with placement.  Patient/Family's Understanding of and Emotional Response to Diagnosis, Current Treatment, and Prognosis:  Pt has good understanding of his impairment and realizes that he will need rehab. Pt not pleased that he has to consider other options instead of IP Rehab. Pt indicated that he will f/u with family and friends about the best SNF to go to. Pt indicated that he does not want his care to diminish when he transition to a SNF. CSW validated his feelings and provided supportive insight. CSW will continue to assist with the disposition process.  Emotional Assessment Appearance:  Appears stated age Attitude/Demeanor/Rapport:  (Cooperative) Affect (typically observed):  Accepting, Appropriate Orientation:  Oriented to Situation, Oriented to  Time, Oriented to Place, Oriented to Self Alcohol / Substance use:  Not Applicable Psych involvement (Current and /or in the community):  No (Comment)  Discharge Needs  Concerns to be addressed:  Discharge Planning Concerns Readmission within the last 30 days:  No Current discharge risk:  Dependent with Mobility, Physical Impairment Barriers to Discharge:  No Barriers Identified   David V Pencil, LCSW 01/19/2018, 10:09 AM  

## 2018-01-19 NOTE — Progress Notes (Signed)
Inpatient Diabetes Program Recommendations  AACE/ADA: New Consensus Statement on Inpatient Glycemic Control (2015)  Target Ranges:  Prepandial:   less than 140 mg/dL      Peak postprandial:   less than 180 mg/dL (1-2 hours)      Critically ill patients:  140 - 180 mg/dL   Lab Results  Component Value Date   GLUCAP 210 (H) 01/19/2018   HGBA1C 10.0 (H) 12/21/2017    Review of Glycemic Control Results for David Lane, David Lane (MRN [id number]) as of 01/19/2018 08:50  Ref. Range 01/18/2018 11:13 01/18/2018 16:47 01/18/2018 21:11 01/19/2018 06:53  Glucose-Capillary Latest Ref Range: 65 - 99 mg/dL 134 (H) 110 (H) 97 210 (H)   Diabetes history: Type 1 DM Outpatient Diabetes medications:Lantus 80 units QHS Current orders for Inpatient glycemic control: Novolog 0-5 units QHS, Novolog 0-9 units TID, Lantus 30 units QHS, Lantus 50 units QD, Novolog 7 units TID   Inpatient Diabetes Program Recommendations:    Noted bed request for SNF.   Also, of note patient refusing QHS dose of Lantus 30 units QHS on 3/24. The comment in [facility] by RN stated, " Patient is afraid he will drop too low over night." This was the dose the patient requested, which was his home dose.   Per DM coordinator's note from 4/23 & 4/24-Patient has experienced multiple lows on total of Lantus 80 units QHS. Would not recommend this regimen at discharge.   "Discussed Novolin 70/30 as an option for patient. Educated patient that he has to eat a meal with injections, but that this may be a more affordable option. Patient is opting to think about it." In the event he is open to regimen change, could consider 70/30 30 units BID.   Please let our team know if we can be of any further assistance.   Thanks, Bronson Curb, MSN, RNC-OB Diabetes Coordinator [phone] (8a-5p)

## 2018-01-19 NOTE — Social Work (Signed)
CSW went back to meet with patient to discuss SNF's. Pt indicated that he wanted [facility]. CSW will f/u with SNF as they have not offered a SNF bed. CSW contacted admission staff and left a message.  CSW will f/u.  3:00pm [facility] confirmed SNF bed. CSW then faxed clinicals to [facility].  CSW then advised patient of the SNF bed and confirmed again placement.  CSW will f/u for disposition, pending Insurance auth.  Elissa Hefty, LCSW Clinical Social Worker [phone]

## 2018-01-19 NOTE — Social Work (Signed)
CSW went back to meet with patient at bedside to discuss SNF offers and patient indicating that he is still reviewing.  CSW will f/u again. Pt needs to select a SNF so Insurance Auth can be initiated.  Elissa Hefty, LCSW Clinical Social Worker [phone]

## 2018-01-19 NOTE — Progress Notes (Signed)
OT Cancellation Note  Patient Details Name: David Lane MRN: [id number] DOB: 26-Aug-1946   Cancelled Treatment:     pt currently eating breakfast.  Almon Register [phone] 01/19/2018, 7:57 AM

## 2018-01-19 NOTE — Progress Notes (Addendum)
Nutrition Follow Up  DOCUMENTATION CODES:   Not applicable  INTERVENTION:    Continue Prostat liquid protein po 30 ml BID with meals, each supplement provides 100 kcal, 15 grams protein  NUTRITION DIAGNOSIS:   Increased nutrient needs related to wound healing as evidenced by estimated needs, ongoing   GOAL:   Patient will meet greater than or equal to 90% of their needs, met  MONITOR:   PO intake, Supplement acceptance, Labs, Skin, Weight trends, I & O's  ASSESSMENT:   72 y.o. 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     Pt s/p procedures 4/20: TRANSMETATARSAL AMPUTATION Application Praveena wound VAC  Appetite much better. PO intake 100% per flowsheet records. Receiving Prostat liquid protein 30 ml BID. Labs and medications reviewed. CBG's [phone].  Diet Order:  Diet Carb Modified Fluid consistency: Thin; Room service appropriate? Yes  EDUCATION NEEDS:   No education needs have been identified at this time  Skin:  Skin Assessment: Skin Integrity Issues: Skin Integrity Issues:: Wound VAC Wound Vac: L leg Other: LE wound infection   Last BM:  4/25   Intake/Output Summary (Last 24 hours) at 01/19/2018 1554 Last data filed at 01/18/2018 1747 Gross per 24 hour  Intake 240 ml  Output -  Net 240 ml   Height:   Ht Readings from Last 1 Encounters:  01/13/18 6' (1.829 m)   Weight:   Wt Readings from Last 1 Encounters:  01/13/18 196 lb (88.9 kg)   BMI:  Body mass index is 26.58 kg/m.  Estimated Nutritional Needs:   Kcal:  2000-2200  Protein:  100-115 gm  Fluid:  2.0-2.2 L  Arthur Holms, RD, LDN Pager #: [phone] After-Hours Pager #: [phone]

## 2018-01-20 ENCOUNTER — Encounter (HOSPITAL_COMMUNITY): Payer: Self-pay

## 2018-01-20 ENCOUNTER — Other Ambulatory Visit: Payer: Self-pay

## 2018-01-20 ENCOUNTER — Inpatient Hospital Stay (HOSPITAL_COMMUNITY)
Admission: RE | Admit: 2018-01-20 | Discharge: 2018-01-28 | DRG: 560 | Disposition: A | Discharge status: Home Health | Payer: Medicare Other | Source: Intra-hospital | Attending: Physical Medicine & Rehabilitation | Admitting: Physical Medicine & Rehabilitation

## 2018-01-20 DIAGNOSIS — Z79899 Other long term (current) drug therapy: Secondary | ICD-10-CM

## 2018-01-20 DIAGNOSIS — I255 Ischemic cardiomyopathy: Secondary | ICD-10-CM | POA: Diagnosis not present

## 2018-01-20 DIAGNOSIS — D62 Acute posthemorrhagic anemia: Secondary | ICD-10-CM

## 2018-01-20 DIAGNOSIS — E1042 Type 1 diabetes mellitus with diabetic polyneuropathy: Secondary | ICD-10-CM | POA: Diagnosis present

## 2018-01-20 DIAGNOSIS — Z794 Long term (current) use of insulin: Secondary | ICD-10-CM | POA: Diagnosis not present

## 2018-01-20 DIAGNOSIS — Z955 Presence of coronary angioplasty implant and graft: Secondary | ICD-10-CM

## 2018-01-20 DIAGNOSIS — E785 Hyperlipidemia, unspecified: Secondary | ICD-10-CM | POA: Diagnosis not present

## 2018-01-20 DIAGNOSIS — I2510 Atherosclerotic heart disease of native coronary artery without angina pectoris: Secondary | ICD-10-CM | POA: Diagnosis present

## 2018-01-20 DIAGNOSIS — I110 Hypertensive heart disease with heart failure: Secondary | ICD-10-CM | POA: Diagnosis not present

## 2018-01-20 DIAGNOSIS — I5042 Chronic combined systolic (congestive) and diastolic (congestive) heart failure: Secondary | ICD-10-CM | POA: Diagnosis present

## 2018-01-20 DIAGNOSIS — E162 Hypoglycemia, unspecified: Secondary | ICD-10-CM

## 2018-01-20 DIAGNOSIS — K8689 Other specified diseases of pancreas: Secondary | ICD-10-CM

## 2018-01-20 DIAGNOSIS — F419 Anxiety disorder, unspecified: Secondary | ICD-10-CM | POA: Diagnosis present

## 2018-01-20 DIAGNOSIS — I252 Old myocardial infarction: Secondary | ICD-10-CM | POA: Diagnosis not present

## 2018-01-20 DIAGNOSIS — I10 Essential (primary) hypertension: Secondary | ICD-10-CM | POA: Diagnosis not present

## 2018-01-20 DIAGNOSIS — E1051 Type 1 diabetes mellitus with diabetic peripheral angiopathy without gangrene: Secondary | ICD-10-CM | POA: Diagnosis not present

## 2018-01-20 DIAGNOSIS — Z89432 Acquired absence of left foot: Secondary | ICD-10-CM

## 2018-01-20 DIAGNOSIS — E10649 Type 1 diabetes mellitus with hypoglycemia without coma: Secondary | ICD-10-CM | POA: Diagnosis not present

## 2018-01-20 DIAGNOSIS — R5381 Other malaise: Secondary | ICD-10-CM | POA: Diagnosis present

## 2018-01-20 DIAGNOSIS — Z888 Allergy status to other drugs, medicaments and biological substances status: Secondary | ICD-10-CM

## 2018-01-20 DIAGNOSIS — Z7982 Long term (current) use of aspirin: Secondary | ICD-10-CM

## 2018-01-20 DIAGNOSIS — Z833 Family history of diabetes mellitus: Secondary | ICD-10-CM

## 2018-01-20 DIAGNOSIS — R269 Unspecified abnormalities of gait and mobility: Secondary | ICD-10-CM | POA: Diagnosis present

## 2018-01-20 DIAGNOSIS — T368X5A Adverse effect of other systemic antibiotics, initial encounter: Secondary | ICD-10-CM | POA: Diagnosis present

## 2018-01-20 DIAGNOSIS — R7309 Other abnormal glucose: Secondary | ICD-10-CM

## 2018-01-20 DIAGNOSIS — Z4781 Encounter for orthopedic aftercare following surgical amputation: Principal | ICD-10-CM

## 2018-01-20 DIAGNOSIS — E089 Diabetes mellitus due to underlying condition without complications: Secondary | ICD-10-CM | POA: Diagnosis present

## 2018-01-20 DIAGNOSIS — N179 Acute kidney failure, unspecified: Secondary | ICD-10-CM | POA: Diagnosis not present

## 2018-01-20 DIAGNOSIS — K219 Gastro-esophageal reflux disease without esophagitis: Secondary | ICD-10-CM | POA: Diagnosis not present

## 2018-01-20 DIAGNOSIS — M86272 Subacute osteomyelitis, left ankle and foot: Secondary | ICD-10-CM | POA: Diagnosis present

## 2018-01-20 DIAGNOSIS — Y92230 Patient room in hospital as the place of occurrence of the external cause: Secondary | ICD-10-CM | POA: Diagnosis present

## 2018-01-20 LAB — BASIC METABOLIC PANEL
Anion gap: 7 (ref 5–15)
BUN: 19 mg/dL (ref 6–20)
CALCIUM: 8.2 mg/dL — AB (ref 8.9–10.3)
CO2: 25 mmol/L (ref 22–32)
Chloride: 105 mmol/L (ref 101–111)
Creatinine, Ser: 1.28 mg/dL — ABNORMAL HIGH (ref 0.61–1.24)
GFR, EST NON AFRICAN AMERICAN: 55 mL/min — AB (ref >60)
Glucose, Bld: 189 mg/dL — ABNORMAL HIGH (ref 65–99)
POTASSIUM: 4 mmol/L (ref 3.5–5.1)
SODIUM: 137 mmol/L (ref 135–145)

## 2018-01-20 LAB — GLUCOSE, CAPILLARY
GLUCOSE-CAPILLARY: 184 mg/dL — AB (ref 65–99)
Glucose-Capillary: 139 mg/dL — ABNORMAL HIGH (ref 65–99)
Glucose-Capillary: 128 mg/dL — ABNORMAL HIGH (ref 65–99)

## 2018-01-20 MED ORDER — ROSUVASTATIN CALCIUM 40 MG PO TABS
40.0000 mg | ORAL_TABLET | Freq: Every day | ORAL | Status: DC
Start: 2018-01-20 — End: 2018-01-28
  Administered 2018-01-20 – 2018-01-27 (×7): 40 mg via ORAL
  Filled 2018-01-20 (×8): qty 1

## 2018-01-20 MED ORDER — DIPHENHYDRAMINE HCL 12.5 MG/5ML PO ELIX: 12.5000 mg | ORAL_SOLUTION | Freq: Four times a day (QID) | ORAL | Status: DC | PRN

## 2018-01-20 MED ORDER — POLYVINYL ALCOHOL 1.4 % OP SOLN
1.0000 [drp] | OPHTHALMIC | Status: DC | PRN
  Administered 2018-01-22: 1 [drp] via OPHTHALMIC
  Filled 2018-01-20: qty 15

## 2018-01-20 MED ORDER — ALUM & MAG HYDROXIDE-SIMETH 200-200-20 MG/5ML PO SUSP: 30.0000 mL | ORAL | Status: DC | PRN

## 2018-01-20 MED ORDER — DOCUSATE SODIUM 100 MG PO CAPS
100.0000 mg | ORAL_CAPSULE | Freq: Two times a day (BID) | ORAL | Status: DC
  Administered 2018-01-22 – 2018-01-27 (×4): 100 mg via ORAL
  Filled 2018-01-20 (×16): qty 1

## 2018-01-20 MED ORDER — INSULIN GLARGINE 100 UNIT/ML ~~LOC~~ SOLN
5.0000 [IU] | Freq: Every day | SUBCUTANEOUS | Status: DC
  Administered 2018-01-21 – 2018-01-22 (×2): 5 [IU] via SUBCUTANEOUS
  Filled 2018-01-20 (×5): qty 0.05

## 2018-01-20 MED ORDER — BISACODYL 10 MG RE SUPP: 10.0000 mg | Freq: Every day | RECTAL | Status: DC | PRN

## 2018-01-20 MED ORDER — POLYETHYLENE GLYCOL 3350 17 G PO PACK: 17.0000 g | PACK | Freq: Every day | ORAL | 0 refills | Status: DC | PRN

## 2018-01-20 MED ORDER — POLYETHYLENE GLYCOL 3350 17 G PO PACK: 17.0000 g | PACK | Freq: Every day | ORAL | Status: DC | PRN

## 2018-01-20 MED ORDER — PROCHLORPERAZINE EDISYLATE 10 MG/2ML IJ SOLN
5.0000 mg | Freq: Four times a day (QID) | INTRAMUSCULAR | Status: DC | PRN
Start: 2018-01-20 — End: 2018-01-28

## 2018-01-20 MED ORDER — ACETAMINOPHEN 325 MG PO TABS: 325.0000 mg | ORAL_TABLET | Freq: Four times a day (QID) | ORAL | 0 refills | Status: DC | PRN

## 2018-01-20 MED ORDER — TRAZODONE HCL 50 MG PO TABS: 25.0000 mg | ORAL_TABLET | Freq: Every evening | ORAL | Status: DC | PRN

## 2018-01-20 MED ORDER — CARVEDILOL 3.125 MG PO TABS
3.1250 mg | ORAL_TABLET | Freq: Two times a day (BID) | ORAL | Status: DC
Start: 2018-01-21 — End: 2018-01-25
  Administered 2018-01-21 – 2018-01-25 (×9): 3.125 mg via ORAL
  Filled 2018-01-20 (×9): qty 1

## 2018-01-20 MED ORDER — INSULIN GLARGINE 100 UNIT/ML ~~LOC~~ SOLN: 50.0000 [IU] | Freq: Every day | SUBCUTANEOUS | 11 refills | Status: DC

## 2018-01-20 MED ORDER — ENOXAPARIN SODIUM 30 MG/0.3ML ~~LOC~~ SOLN
30.0000 mg | SUBCUTANEOUS | Status: DC
  Administered 2018-01-20 – 2018-01-27 (×8): 30 mg via SUBCUTANEOUS
  Filled 2018-01-20 (×8): qty 0.3

## 2018-01-20 MED ORDER — PROCHLORPERAZINE MALEATE 5 MG PO TABS: 5.0000 mg | ORAL_TABLET | Freq: Four times a day (QID) | ORAL | Status: DC | PRN

## 2018-01-20 MED ORDER — PREMIER PROTEIN SHAKE
11.0000 [oz_av] | Freq: Three times a day (TID) | ORAL | Status: DC
  Administered 2018-01-21 – 2018-01-28 (×13): 11 [oz_av] via ORAL
  Filled 2018-01-20 (×33): qty 325.31

## 2018-01-20 MED ORDER — PRO-STAT SUGAR FREE PO LIQD: 30.0000 mL | Freq: Two times a day (BID) | ORAL | 0 refills | Status: DC

## 2018-01-20 MED ORDER — GUAIFENESIN-DM 100-10 MG/5ML PO SYRP: 5.0000 mL | ORAL_SOLUTION | Freq: Four times a day (QID) | ORAL | Status: DC | PRN

## 2018-01-20 MED ORDER — TICAGRELOR 90 MG PO TABS: 90.0000 mg | ORAL_TABLET | Freq: Two times a day (BID) | ORAL | 0 refills | Status: DC

## 2018-01-20 MED ORDER — DOCUSATE SODIUM 100 MG PO CAPS: 100.0000 mg | ORAL_CAPSULE | Freq: Two times a day (BID) | ORAL | 0 refills | Status: DC

## 2018-01-20 MED ORDER — ACETAMINOPHEN 325 MG PO TABS: 325.0000 mg | ORAL_TABLET | ORAL | Status: DC | PRN

## 2018-01-20 MED ORDER — HYDROCODONE-ACETAMINOPHEN 7.5-325 MG PO TABS: 1.0000 | ORAL_TABLET | ORAL | Status: DC | PRN

## 2018-01-20 MED ORDER — ONDANSETRON HCL 4 MG PO TABS: 4.0000 mg | ORAL_TABLET | Freq: Four times a day (QID) | ORAL | Status: DC | PRN

## 2018-01-20 MED ORDER — HYDROCODONE-ACETAMINOPHEN 7.5-325 MG PO TABS: 1.0000 | ORAL_TABLET | ORAL | 0 refills | Status: DC | PRN

## 2018-01-20 MED ORDER — INSULIN GLARGINE 100 UNIT/ML ~~LOC~~ SOLN
50.0000 [IU] | Freq: Every day | SUBCUTANEOUS | Status: DC
  Administered 2018-01-21 – 2018-01-22 (×2): 50 [IU] via SUBCUTANEOUS
  Filled 2018-01-20 (×3): qty 0.5

## 2018-01-20 MED ORDER — ONDANSETRON HCL 4 MG/2ML IJ SOLN: 4.0000 mg | Freq: Four times a day (QID) | INTRAMUSCULAR | Status: DC | PRN

## 2018-01-20 MED ORDER — PROCHLORPERAZINE 25 MG RE SUPP: 12.5000 mg | Freq: Four times a day (QID) | RECTAL | Status: DC | PRN

## 2018-01-20 MED ORDER — INSULIN ASPART 100 UNIT/ML ~~LOC~~ SOLN
7.0000 [IU] | Freq: Three times a day (TID) | SUBCUTANEOUS | Status: DC
  Administered 2018-01-21 – 2018-01-27 (×15): 7 [IU] via SUBCUTANEOUS

## 2018-01-20 MED ORDER — INSULIN ASPART 100 UNIT/ML ~~LOC~~ SOLN
0.0000 [IU] | Freq: Three times a day (TID) | SUBCUTANEOUS | Status: DC
  Administered 2018-01-21 (×2): 3 [IU] via SUBCUTANEOUS
  Administered 2018-01-22 (×3): 1 [IU] via SUBCUTANEOUS
  Administered 2018-01-23: 2 [IU] via SUBCUTANEOUS
  Administered 2018-01-23: 3 [IU] via SUBCUTANEOUS
  Administered 2018-01-24 (×2): 1 [IU] via SUBCUTANEOUS
  Administered 2018-01-26: 2 [IU] via SUBCUTANEOUS

## 2018-01-20 MED ORDER — INSULIN GLARGINE 100 UNIT/ML ~~LOC~~ SOLN
5.0000 [IU] | Freq: Every day | SUBCUTANEOUS | Status: DC
  Filled 2018-01-20: qty 0.05

## 2018-01-20 MED ORDER — PRO-STAT SUGAR FREE PO LIQD
30.0000 mL | Freq: Two times a day (BID) | ORAL | Status: DC
  Administered 2018-01-20 – 2018-01-28 (×16): 30 mL via ORAL
  Filled 2018-01-20 (×17): qty 30

## 2018-01-20 MED ORDER — ASPIRIN 81 MG PO CHEW
81.0000 mg | CHEWABLE_TABLET | Freq: Every day | ORAL | Status: DC
  Administered 2018-01-21 – 2018-01-28 (×8): 81 mg via ORAL
  Filled 2018-01-20 (×8): qty 1

## 2018-01-20 MED ORDER — PANTOPRAZOLE SODIUM 40 MG PO TBEC
40.0000 mg | DELAYED_RELEASE_TABLET | Freq: Every day | ORAL | Status: DC
  Administered 2018-01-21 – 2018-01-28 (×8): 40 mg via ORAL
  Filled 2018-01-20 (×8): qty 1

## 2018-01-20 MED ORDER — INSULIN GLARGINE 100 UNIT/ML ~~LOC~~ SOLN: 5.0000 [IU] | Freq: Every day | SUBCUTANEOUS | 11 refills | Status: DC

## 2018-01-20 MED ORDER — TICAGRELOR 90 MG PO TABS
90.0000 mg | ORAL_TABLET | Freq: Two times a day (BID) | ORAL | Status: DC
  Administered 2018-01-20 – 2018-01-28 (×15): 90 mg via ORAL
  Filled 2018-01-20 (×16): qty 1

## 2018-01-20 MED ORDER — FLEET ENEMA 7-19 GM/118ML RE ENEM: 1.0000 | ENEMA | Freq: Once | RECTAL | Status: DC | PRN

## 2018-01-20 MED ORDER — INSULIN ASPART 100 UNIT/ML ~~LOC~~ SOLN
0.0000 [IU] | Freq: Every day | SUBCUTANEOUS | Status: DC
  Administered 2018-01-22: 2 [IU] via SUBCUTANEOUS

## 2018-01-20 NOTE — Progress Notes (Signed)
Patient ID: David Lane, male   DOB: May 28, 1946, 72 y.o.   MRN: [id number] No drainage, wound VAC canister.  The hospital wound VAC was transitioned to a portable Praveena plus wound VAC pump.  This will stay in place for 1 week.  I will follow-up in the office in 1 week to change the dressing.  Wound VAC stopped working prior to follow-up the dressing can be changed out to a dry dressing.  Nonweightbearing on the left lower extremity.  Patient was given instructions for dorsiflexion exercises and elevation.

## 2018-01-20 NOTE — H&P (Signed)
Physical Medicine and Rehabilitation Admission H&P       Chief Complaint  Patient presents with  . Debility    HPI: David Lane is a 72 year old male with history of coronary artery disease with ischemic cardiomyopathy and recent stent, hypertension, pancreatitis, type 1 diabetes mellitus, who was admitted on 01/12/2018 with left drainage with abscess, cellulitis and osteomyelitis.  He was started on IV antibiotics and underwent left transmetatarsal amputation by Dr. Sharol Given on 01/14/2018 and is  to be nonweightbearing postop.  He continues to have blood drainage from transmetatarsal site and VAC to stay in place for another week. Therapy ongoing and patient noted to have deficits in mobility and self-care tasks.  CIR was recommended for follow-up therapy.   Review of Systems  Constitutional: Negative for chills and fever.  HENT: Positive for hearing loss.   Eyes: Negative for blurred vision and double vision.  Respiratory: Negative for cough and shortness of breath.   Cardiovascular: Negative for chest pain and palpitations.  Gastrointestinal: Negative for constipation, heartburn and nausea.  Genitourinary: Negative for dysuria and urgency.  Musculoskeletal: Negative for joint pain and myalgias.  Neurological: Negative for weakness.  Psychiatric/Behavioral: The patient is nervous/anxious. The patient does not have insomnia.           Past Medical History:  Diagnosis Date  . CAD (coronary artery disease)    last cath by [facility] DR.  Mihai Croitoru showing  some  disease involving LCX and small size of Diag   . CHF exacerbation, due to diastolic dysfunction 74/04/1447  . Chronic systolic CHF (congestive heart failure), NYHA class 1 ([place]) 07/17/2012  . GERD (gastroesophageal reflux disease)   . Hepatic lesion 02/04/11  . Hyperlipemia   . Hypertension   . Ischemic cardiomyopathy    EF 35-40%  . Liver hemangioma   . NSTEMI (non-ST elevated myocardial infarction)  ([place]) 11/21/2009  . Pancreatitis 2000's  . Shortness of breath   . Type II diabetes mellitus ([place])          Past Surgical History:  Procedure Laterality Date  . AMPUTATION Left 01/14/2018   Procedure: TRANSMETATARSAL AMPUTATION;  Surgeon: Newt Minion, MD;  Location: [place];  Service: Orthopedics;  Laterality: Left;  . CARDIAC CATHETERIZATION  2011   minimal disease, medical management  . CORONARY ANGIOPLASTY WITH STENT PLACEMENT  07/14/2012   successful PCI & stenting of mid LAD & PDA off the dominant CX  . CORONARY/GRAFT ACUTE MI REVASCULARIZATION N/A 12/20/2017   Procedure: Coronary/Graft Acute MI Revascularization;  Surgeon: Martinique, Peter M, MD;  Location: [place] CV LAB;  Service: Cardiovascular;  Laterality: N/A;  . INCISION AND DRAINAGE ABSCESS ANAL  1970's  . LEFT HEART CATH AND CORONARY ANGIOGRAPHY N/A 12/20/2017   Procedure: LEFT HEART CATH AND CORONARY ANGIOGRAPHY;  Surgeon: Martinique, Peter M, MD;  Location: [place] [facility];  Service: Cardiovascular;  Laterality: N/A;  . LEFT HEART CATHETERIZATION WITH CORONARY ANGIOGRAM N/A 07/14/2012   Procedure: LEFT HEART CATHETERIZATION WITH CORONARY ANGIOGRAM;  Surgeon: [name], MD;  Location: [place] CATH LAB;  Service: Cardiovascular;  Laterality: N/A;  . PERCUTANEOUS CORONARY STENT INTERVENTION (PCI-S) Right 07/14/2012   Procedure: PERCUTANEOUS CORONARY STENT INTERVENTION (PCI-S);  Surgeon: [name], MD;  Location: [facility] CATH LAB;  Service: Cardiovascular;  Laterality: Right;         Family History  Problem Relation Age of Onset  . Diabetes Mother   . Diabetes Paternal Grandmother   . Diabetes Maternal Grandfather   .  Colon cancer Neg Hx     Social History:  Lives alone but girlfriend and sister have been helping out since stent.  Has been sedentary. He reports that he has never smoked. He has never used smokeless tobacco. He reports that he does not drink alcohol or use drugs.           Allergies  Allergen Reactions  . Lipitor [Atorvastatin] Other (See Comments)    Muscle weakness  . Metformin And Related Diarrhea    Severe diarrhea          Medications Prior to Admission  Medication Sig Dispense Refill  . aspirin 81 MG tablet Take 81 mg by mouth daily.      . carvedilol (COREG) 3.125 MG tablet Take 1 tablet (3.125 mg total) by mouth 2 (two) times daily with a meal. 60 tablet 6  . Insulin Glargine (LANTUS SOLOSTAR) 100 UNIT/ML Solostar Pen Inject 15 Units into the skin daily at 10 pm. NEED APPOINTMENT (Patient taking differently: Inject 80 Units into the skin daily before breakfast. ) 9 pen 1  . lisinopril (PRINIVIL,ZESTRIL) 20 MG tablet Take 0.5 tablets (10 mg total) by mouth daily. (Patient taking differently: Take 10 mg by mouth 2 (two) times daily. ) 180 tablet 3  . pantoprazole (PROTONIX) 40 MG tablet Take 1 tablet (40 mg total) by mouth daily. 90 tablet 3  . rosuvastatin (CRESTOR) 40 MG tablet Take 1 tablet (40 mg total) by mouth at bedtime. 30 tablet 1  . ticagrelor (BRILINTA) 90 MG TABS tablet Take 1 tablet (90 mg total) by mouth 2 (two) times daily. 180 tablet 3  . glucose blood (ACCU-CHEK AVIVA) test strip 4 (four) times daily. DX code e11.9 100 each 0  . insulin aspart (NOVOLOG) 100 UNIT/ML injection Inject 5 Units into the skin 3 (three) times daily with meals. 10 mL 11  . Insulin Pen Needle (B-D UF III MINI PEN NEEDLES) 31G X 5 MM MISC use to inject insulin daily 100 each 0    Drug Regimen Review  Drug regimen was reviewed and remains appropriate with no significant issues identified  Home: Home Living Family/patient expects to be discharged to:: Inpatient rehab Living Arrangements: Alone Type of Home: House Home Access: Stairs to enter Entrance Stairs-Number of Steps: 3 Entrance Stairs-Rails: None Home Layout: One level Home Equipment: None   Functional History: Prior Function Level of Independence: Independent  Functional Status:   Mobility: Bed Mobility Overal bed mobility: Modified Independent General bed mobility comments: pt up in recliner upon my entry Transfers Overall transfer level: Needs assistance Equipment used: Rolling walker (2 wheeled) Transfers: Sit to/from Stand Sit to Stand: Min guard General transfer comment: line mgt and cues to ensure wound vac is in safe position before standing/ambulating. Able to rise with min guard and no LOB this visit.  Ambulation/Gait Ambulation/Gait assistance: Min guard, Supervision Ambulation Distance (Feet): 50 Feet Assistive device: Rolling walker (2 wheeled) Gait Pattern/deviations: Step-to pattern General Gait Details: Patient with hop gait, cont good adherence to NWB status. chair follow as patient fatigues after 30'  Gait velocity: decreased  ADL: ADL Overall ADL's : Needs assistance/impaired Eating/Feeding: Independent Grooming: Wash/dry hands, Wash/dry face, Oral care, Supervision/safety, Sitting Grooming Details (indicate cue type and reason): pt requires (A) to safely reach sink levle and sitting to complete grooming at the sink. cues for safety during task  Upper Body Bathing: Supervision/ safety, Sitting Upper Body Bathing Details (indicate cue type and reason): sink level  Lower Body Bathing:  Minimal assistance, Sit to/from stand Upper Body Dressing : Supervision/safety Lower Body Dressing: Supervision/safety, Set up Lower Body Dressing Details (indicate cue type and reason): min guard A sit<>stand Toilet Transfer: Minimal assistance, Ambulation, RW, BSC Toilet Transfer Details (indicate cue type and reason): over toilet Functional mobility during ADLs: Minimal assistance, Rolling walker General ADL Comments: pt complete sink level groomign and bathing task. pt declined shaving after specifically requesting due to fatigue with adl task. pt states "i can't believe just this bit of doing has worn me out"  Cognition: Cognition Overall Cognitive  Status: Within Functional Limits for tasks assessed Orientation Level: Oriented X4 Cognition Arousal/Alertness: Awake/alert Behavior During Therapy: WFL for tasks assessed/performed Overall Cognitive Status: Within Functional Limits for tasks assessed   Blood pressure 131/83, pulse 84, temperature 98.2 F (36.8 C), temperature source Oral, resp. rate 18, height 6' (1.829 m), weight 88.9 kg (196 lb), SpO2 99 %. Physical Exam  Constitutional: He is oriented to person, place, and time. He appears well-developed and well-nourished.  HENT:  Head: Normocephalic and atraumatic.  Eyes: Pupils are equal, round, and reactive to light.  Neck: Normal range of motion. Neck supple.  Cardiovascular: Normal rate and regular rhythm. Exam reveals no friction rub.  No murmur heard. Respiratory: Effort normal. No respiratory distress. He has no wheezes. He has no rales.  GI: Soft. Bowel sounds are normal. He exhibits no distension. There is no tenderness.  Musculoskeletal:  Left transmetartasal amputation site with prevana VAC  in place and continues to have bloody drainage. Hard callus under right great toe. Left foot appropriately tender. No edema  Neurological: He is alert and oriented to person, place, and time. No cranial nerve deficit.  Verbose. Has poor awareness of medical issues. UE motor 5/5. LLE limited by pain but 3+ to 4/5. RLE 4/5. Decreased LT distally RLE  Skin: Skin is warm.  Psychiatric: He has a normal mood and affect.    LabResultsLast48Hours        Results for orders placed or performed during the hospital encounter of 01/12/18 (from the past 48 hour(s))  Glucose, capillary     Status: Abnormal   Collection Time: 01/18/18  4:47 PM  Result Value Ref Range   Glucose-Capillary 110 (H) 65 - 99 mg/dL  Glucose, capillary     Status: None   Collection Time: 01/18/18  9:11 PM  Result Value Ref Range   Glucose-Capillary 97 65 - 99 mg/dL  Basic metabolic panel     Status:  Abnormal   Collection Time: 01/19/18  6:40 AM  Result Value Ref Range   Sodium 136 135 - 145 mmol/L   Potassium 4.0 3.5 - 5.1 mmol/L   Chloride 103 101 - 111 mmol/L   CO2 26 22 - 32 mmol/L   Glucose, Bld 237 (H) 65 - 99 mg/dL   BUN 17 6 - 20 mg/dL   Creatinine, Ser 1.21 0.61 - 1.24 mg/dL   Calcium 8.0 (L) 8.9 - 10.3 mg/dL   GFR calc non Af Amer 59 (L) >60 mL/min   GFR calc Af Amer >60 >60 mL/min    Comment: (NOTE) The eGFR has been calculated using the CKD EPI equation. This calculation has not been validated in all clinical situations. eGFR's persistently <60 mL/min signify possible Chronic Kidney Disease.    Anion gap 7 5 - 15    Comment: Performed at [facility] [address] [place], [place] [address]  Glucose, capillary     Status: Abnormal  Collection Time: 01/19/18  6:53 AM  Result Value Ref Range   Glucose-Capillary 210 (H) 65 - 99 mg/dL  Glucose, capillary     Status: Abnormal   Collection Time: 01/19/18 11:41 AM  Result Value Ref Range   Glucose-Capillary 230 (H) 65 - 99 mg/dL  Glucose, capillary     Status: None   Collection Time: 01/19/18  4:29 PM  Result Value Ref Range   Glucose-Capillary 98 65 - 99 mg/dL  Glucose, capillary     Status: None   Collection Time: 01/19/18  9:22 PM  Result Value Ref Range   Glucose-Capillary 85 65 - 99 mg/dL  Glucose, capillary     Status: Abnormal   Collection Time: 01/20/18  6:36 AM  Result Value Ref Range   Glucose-Capillary 184 (H) 65 - 99 mg/dL  Basic metabolic panel     Status: Abnormal   Collection Time: 01/20/18  7:13 AM  Result Value Ref Range   Sodium 137 135 - 145 mmol/L   Potassium 4.0 3.5 - 5.1 mmol/L   Chloride 105 101 - 111 mmol/L   CO2 25 22 - 32 mmol/L   Glucose, Bld 189 (H) 65 - 99 mg/dL   BUN 19 6 - 20 mg/dL   Creatinine, Ser 1.28 (H) 0.61 - 1.24 mg/dL   Calcium 8.2 (L) 8.9 - 10.3 mg/dL   GFR calc non Af Amer 55 (L) >60 mL/min   GFR calc Af Amer >60 >60  mL/min    Comment: (NOTE) The eGFR has been calculated using the CKD EPI equation. This calculation has not been validated in all clinical situations. eGFR's persistently <60 mL/min signify possible Chronic Kidney Disease.    Anion gap 7 5 - 15    Comment: Performed at [facility] [address], [place], [place] [address]  Glucose, capillary     Status: Abnormal   Collection Time: 01/20/18 11:56 AM  Result Value Ref Range   Glucose-Capillary 139 (H) 65 - 99 mg/dL   Comment 1 Notify RN    Comment 2 Document in Chart      ImagingResults(Last48hours)  No results found.       Medical Problem List and Plan: 1.  Functional deficits secondary to left transmetatarsal amputation             -admit to inpatient rehab 2.  DVT Prophylaxis/Anticoagulation: Pharmaceutical: Lovenox 3. Pain Management:  4. Mood: LCSW to follow for evaluation and support  5. Neuropsych: This patient is capable of making decisions on his own behalf. 6. Skin/Wound Care: Continue Prevena VAC. Follow up with surgical team re: duration of use 7. Fluids/Electrolytes/Nutrition: Monitor I/O daily. Offer supplements if intake poor. Will add for proteins supplement.  8. T1DM poorly controlled:  Monitor BS ac/hs. Continue Lantus bid with meal coverage for better control. Will consult dietician for education.  9. HTN: Monitor BP bid. Continue coreg bid 10 Chronic combined systolic diastolic CHF: Heart healthy diet--compensated on coreg, Crestor and ASA. Off spironolactone and lisinopril at this time due to issue with hypotension.  Monitor weight daily.  11. CAD s/p recent stent: On Brilinta, ASA and Crestor.   12 ABLA: Recheck CBC in am 13. AKI: Due to vancomycin. Encourage fluid intake. Continue to monitor closely for recovery.   Post Admission Physician Evaluation: 1. Functional deficits secondary  to left TMA. 2. Patient is admitted to receive collaborative, interdisciplinary care  between the physiatrist, rehab nursing staff, and therapy team. 3. Patient's level of medical  complexity and substantial therapy needs in context of that medical necessity cannot be provided at a lesser intensity of care such as a SNF. 4. Patient has experienced substantial functional loss from his/her baseline which was documented above under the "Functional History" and "Functional Status" headings.  Judging by the patient's diagnosis, physical exam, and functional history, the patient has potential for functional progress which will result in measurable gains while on inpatient rehab.  These gains will be of substantial and practical use upon discharge  in facilitating mobility and self-care at the household level. 5. Physiatrist will provide 24 hour management of medical needs as well as oversight of the therapy plan/treatment and provide guidance as appropriate regarding the interaction of the two. 6. The Preadmission Screening has been reviewed and patient status is unchanged unless otherwise stated above. 7. 24 hour rehab nursing will assist with bladder management, bowel management, safety, skin/wound care, disease management, medication administration, pain management and patient education  and help integrate therapy concepts, techniques,education, etc. 8. PT will assess and treat for/with: Lower extremity strength, range of motion, stamina, balance, functional mobility, safety, adaptive techniques and equipment, post-op precautions, wound mgt, community reentry, ego support.   Goals are: mod I. 9. OT will assess and treat for/with: ADL's, functional mobility, safety, upper extremity strength, adaptive techniques and equipment, pain mgt, post-op precautions, community reentry.   Goals are: mod I. Therapy may not yet proceed with showering this patient. 10. SLP will assess and treat for/with: n/a.  Goals are: n/a. 11. Case Management and Social Worker will assess and treat for psychological issues  and discharge planning. 12. Team conference will be held weekly to assess progress toward goals and to determine barriers to discharge. 13. Patient will receive at least 3 hours of therapy per day at least 5 days per week. 14. ELOS: 7-10 days       15. Prognosis:  excellent   I have personally performed a face to face diagnostic evaluation of this patient. Additionally, I have reviewed and concur with the physician assistant's documentation above.  Meredith Staggers, MD, [facility]   Bary Leriche, PA-C 01/20/2018

## 2018-01-20 NOTE — Discharge Instructions (Signed)
`Negative Pressure Wound Therapy Dressing Care °Negative pressure wound therapy (NPWT) is a device that helps wounds heal. NPWT helps the wound stay clean and healthy while it heals from the inside. °NPWT uses a bandage (dressing) that is made of a sponge or gauze-like material. The dressing is placed in or inside the wound. The wound is then covered and sealed with a cover dressing that sticks to your skin (adhesive). This keeps air out. A tube connects the cover dressing to a small pump. The pump sucks fluid and germs from the wound. The pump also controls any odor coming from the wound. °What are the risks? °NPWT is usually safe to use. The most common problem is skin irritation from the dressing adhesive, but there are many ways to prevent this from happening. However, more serious problems can develop, such as: °· Bleeding. °· Infection. °· Dehydration. °· Pain. ° °How to change your dressing °How often you change your dressing depends on your wound. If the pump is off for more than two hours, the dressing will need to be changed. Follow your health care provider’s instructions on how often to change it. Your health care provider may change your dressing, or a family member, friend, or caregiver may be shown how to change the dressing. It is important to: °· Wear gloves and protective clothing while changing a dressing. This may include eye protection. °· Never let anyone change your dressing if he or she has an infection, skin condition, or skin wound or cut of any size. ° °Preparing to change your dressing °· If needed, take pain medicine 30 minutes before the dressing change as prescribed by your health care provider. °· Set up a clean station for wound care. You will need: °? A disposable garbage bag that is open and ready to use. °? Hand sanitizer. °? Wound cleanser or saltwater solution (saline) as told by your health care provider. °? New dressing material or bandages. Make sure to open the dressing  package so that the dressing remains on the inside of the package. °You may also need the following in your clean station: °· A box of vinyl gloves. °· Tape. °· Skin protectant. This may be a wipe, film, or spray. °· Clean or germ-free (sterile) scissors. °· Wound liner. °· Cotton tip applicators. ° °Removing your old dressing °· Wash your hands with soap and water. Dry your hands with a clean towel. If soap and water are not available, use hand sanitizer. °· Put on gloves. °· Turn off the pump and disconnect the tubing from the dressing. °· Carefully remove the adhesive cover dressing in the direction of your hair growth. Only touch the outside edges of the dressing. °· Remove the dressing that is inside the wound. If the dressing sticks, use a wound cleanser or saline solution to wet the dressing. This helps it come off more easily. °· Throw the old dressing supplies into the ready garbage bag. °· Remove your gloves by grabbing the cuff and turning the glove inside out. Place the gloves in the trash immediately. °· Wash your hands with soap and water. Dry your hands with a clean towel. If soap and water are not available, use hand sanitizer. °Cleaning your wound °· Follow your health care provider's instructions on how to clean your wound. This may include using a saline or recommended wound cleanser. °· Do not use over-the-counter medicated or antiseptic creams, sprays, liquids, or dressings unless told to do so by your health care   provider. °· Clean the area thoroughly with the recommended saline solution or wound cleanser and a clean gauze pad. °· Throw the gauze pad into the garbage bag. °· Wash your hands with soap and water. Dry your hands with a clean towel. If soap and water are not available, use hand sanitizer. °Applying the dressing °· Apply a skin protectant to any skin that will be exposed to adhesive. Let the skin protectant dry. °· Put a new dressing into the wound. °· Apply a new cover dressing and  tube. °· Take off your gloves. Put them in the plastic bag with the old dressing. Tie the bag shut and throw it away. °· Wash your hands with soap and water. Dry your hands with a clean towel. If soap and water are not available, use hand sanitizer. °· Attach the suction and turn the pump back on. Do not change the settings on the machine without talking to a health care provider. °· Replace the container in the pump that collects fluid if it is full. Do this at least once a week. °Contact a health care provider if: °· You have new pain. °· You develop irritation, a rash, or itching around the wound or dressing. °· You see new black or yellow tissue in your wound. °· The dressing changes are painful or cause bleeding. °· The pump has been off for more than two hours and you do not know how to change the dressing. °· The alarm for the pump goes off and you do not know what to do. °Get help right away if: °· You have a lot of bleeding. °· You see a sudden change in the color or texture of the drainage. °· The wound breaks open. °· You have severe pain. °· You have signs of infection, such as: °? More redness, swelling, or pain. °? More fluid or blood. °? Warmth. °? Pus or a bad smell. °? Red streaks leading from wound. °? A fever. °This information is not intended to replace advice given to you by your health care provider. Make sure you discuss any questions you have with your health care provider. °Document Released: 12/06/2011 Document Revised: 10/09/2015 Document Reviewed: 06/19/2015 °[employer] Interactive Patient Education © 2018 [employer] ° °

## 2018-01-20 NOTE — Discharge Summary (Signed)
Physician Discharge Summary  David Lane:[id number] DOB: [birth year] DOA: 01/12/2018  PCP: Dorena Cookey, MD  Admit date: 01/12/2018 Discharge date: 01/20/2018  Recommendations for Outpatient Follow-up:  1. Check BMP (specifically creatinine) to make sure it is stable. Slightly elevated this am likely due to vanco 2. Follow up with Dr. Sharol Given per scheduled appt  Discharge Diagnoses:  Principal Problem:   Sepsis due to cellulitis [facility]) Active Problems:   Hyperlipidemia   Essential hypertension   Chronic combined systolic and diastolic CHF (congestive heart failure) ([place])   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 (HCC)   Status post coronary artery stent placement   Cutaneous abscess of left foot   Subacute osteomyelitis, left ankle and foot ([place])    Discharge Condition: stable   Diet recommendation: as tolerated   History of present illness:  72 year old male with past medical history significant forCADwith recent STEMI (3/26-31) s/p stenton Brilinta, HTN, HLD; combined CHF(EF 35-40% with grade 1 diastolic dysfunction on 3/15), DM who presented with cellulitisand left foot wound.Started on vanco and zosyn. Seen by Dr. Sharol Given due to concern for necrotizing infection and need for amputation. Pt underwent left transmetatarsal amputation 01/14/2018.  Hospital Course:   Assessment & Plan:   Principal Problem: Sepsis arthritis of the first MTP joint  ([place]) / Diabetic insensate neuropathy with abscess and osteomyelitis of the left foot - MRI left foot showed soft tissue wound along the plantar and dorsal medial aspect of the forefoot at the level of the MTP joint with surrounding cellulitis. Wound extends deep to the first MTP joint capsule. Septic arthritis of the first MTP joint with adjacent marrow edema. 9 x 22 mm fluid collection along the plantar aspect of the first proximal phalanx extending into the medial and lateral soft tissues most concerning for  an abscess and extension of an abscess intermixed with necrotic soft tissue concerning for a necrotizing infection.  - X ray of the left foot showed indings consistent wifth cellulitis centered about the right great toe and first metatarsal. No plain film evidence of osteomyelitis. Moderate right first MTP osteoarthritis - Started on vanco and zosyn - S/P transmetatarsal amputation 01/14/18 by Dr. Meridee Score - Blood cx negative so far - Continue wound vac - Follow up with ortho per sch appt   Active Problems:   Hyperlipidemia - Continue Crestor     AKI - Due to vanco  - Repeat BMP in next few days to make sure it is stable     Essential hypertension - Continue carvedilol 3.125 mg 2 times daily - Continue lisinopril    Chronic combined systolic and diastolic CHF (congestive heart failure) ([place]) - Compensated     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 (HCC) - On Lantus 50 units in am and 5 units at bedtime - Continue  novolog 5 units TID for better glycemic control    Status post coronary artery stent placement - No reports of chest pain     DVT prophylaxis: Continue Brilinta Code Status: full code  Family Communication: no family at bedside Disposition Plan:  Awaiting bed placement    Consultants:   Orthopedic surgery   Procedures:  Transmetatarsal amputation 01/14/2018  Wound vac 4/20 -->  Antimicrobials:   Vanco and zosyn 4/18 --> continue through 4/26       Signed:  Leisa Lenz, MD  [facility] 01/20/2018, 10:46 AM  Pager #: [phone]  Time spent in minutes:  45 minutes    Discharge Exam: Vitals:  01/19/18 2120 01/20/18 0632  BP: 121/74 131/83  Pulse: 90 84  Resp: 18 18  Temp: 98.4 F (36.9 C) 98.2 F (36.8 C)  SpO2: 98% 99%   Vitals:   01/19/18 0650 01/19/18 1412 01/19/18 2120 01/20/18 0632  BP: 138/85 111/67 121/74 131/83  Pulse: 81 94 90 84  Resp: 18 18 18 18   Temp: 98 F (36.7 C) (!)  97.5 F (36.4 C) 98.4 F (36.9 C) 98.2 F (36.8 C)  TempSrc: Oral Oral Oral Oral  SpO2: 98% 100% 98% 99%  Weight:      Height:        General: Pt is alert, follows commands appropriately, not in acute distress Cardiovascular: Regular rate and rhythm, S1/S2 + Respiratory: Clear to auscultation bilaterally, no wheezing, no crackles, no rhonchi Abdominal: Soft, non tender, non distended, bowel sounds +, no guarding Extremities: no edema, left foot toes amputation with wound vac in place Neuro: Grossly nonfocal  Discharge Instructions  Allergies as of 01/20/2018      Reactions   Lipitor [atorvastatin] Other (See Comments)   Muscle weakness   Metformin And Related Diarrhea   Severe diarrhea      Medication List    STOP taking these medications   Insulin Glargine 100 UNIT/ML Solostar Pen Commonly known as:  LANTUS SOLOSTAR Replaced by:  insulin glargine 100 UNIT/ML injection     TAKE these medications   acetaminophen 325 MG tablet Commonly known as:  TYLENOL Take 1-2 tablets (325-650 mg total) by mouth every 6 (six) hours as needed for mild pain (pain score 1-3 or temp > 100.5).   aspirin 81 MG tablet Take 81 mg by mouth daily.   carvedilol 3.125 MG tablet Commonly known as:  COREG Take 1 tablet (3.125 mg total) by mouth 2 (two) times daily with a meal.   docusate sodium 100 MG capsule Commonly known as:  COLACE Take 1 capsule (100 mg total) by mouth 2 (two) times daily.   feeding supplement (PRO-STAT SUGAR FREE 64) Liqd Take 30 mLs by mouth 2 (two) times daily.   glucose blood test strip Commonly known as:  ACCU-CHEK AVIVA 4 (four) times daily. DX code e11.9   HYDROcodone-acetaminophen 7.5-325 MG tablet Commonly known as:  NORCO Take 1-2 tablets by mouth every 4 (four) hours as needed for severe pain (pain score 7-10).   insulin aspart 100 UNIT/ML injection Commonly known as:  novoLOG Inject 5 Units into the skin 3 (three) times daily with meals.   insulin  glargine 100 UNIT/ML injection Commonly known as:  LANTUS Inject 0.05 mLs (5 Units total) into the skin at bedtime.   insulin glargine 100 UNIT/ML injection Commonly known as:  LANTUS Inject 0.5 mLs (50 Units total) into the skin daily. Start taking on:  01/21/2018 Replaces:  Insulin Glargine 100 UNIT/ML Solostar Pen   Insulin Pen Needle 31G X 5 MM Misc Commonly known as:  B-D UF III MINI PEN NEEDLES use to inject insulin daily   lisinopril 20 MG tablet Commonly known as:  PRINIVIL,ZESTRIL Take 0.5 tablets (10 mg total) by mouth daily. What changed:  when to take this   pantoprazole 40 MG tablet Commonly known as:  PROTONIX Take 1 tablet (40 mg total) by mouth daily.   polyethylene glycol packet Commonly known as:  MIRALAX / GLYCOLAX Take 17 g by mouth daily as needed for mild constipation.   rosuvastatin 40 MG tablet Commonly known as:  CRESTOR Take 1 tablet (40 mg total) by mouth  at bedtime.   ticagrelor 90 MG Tabs tablet Commonly known as:  BRILINTA Take 1 tablet (90 mg total) by mouth 2 (two) times daily.            Discharge Care Instructions  (From admission, onward)        Start     Ordered   01/20/18 0000  Non weight bearing    Question Answer Comment  Laterality left   Extremity Lower      01/20/18 0826      Allergies as of 01/20/2018      Reactions   Lipitor [atorvastatin] Other (See Comments)   Muscle weakness   Metformin And Related Diarrhea   Severe diarrhea          Discharge Care Instructions  (From admission, onward)        Start     Ordered   01/20/18 0000  Non weight bearing    Question Answer Comment  Laterality left   Extremity Lower      01/20/18 0826      Contact information for follow-up providers    Newt Minion, MD In 1 week.   Specialty:  Orthopedic Surgery Contact information: [place] [address] [phone]        Dorena Cookey, MD. Schedule an appointment as soon as  possible for a visit in 2 week(s).   Specialty:  Family Medicine Contact information: [place] [address] [phone]        [name], MD .   Specialty:  Cardiology Contact information: [address] [place] [address] [phone]            Contact information for after-discharge care    Destination    [facility] .   Service:  Skilled Nursing Contact information: [address] [place] [phone]                   The results of significant diagnostics from this hospitalization (including imaging, microbiology, ancillary and laboratory) are listed below for reference.    Significant Diagnostic Studies: Mr Foot Left W Wo Contrast  Result Date: 01/13/2018 CLINICAL DATA:  A skin ulceration and soft tissue gas are seen over the dorsum of the foot at and proximal to the great toe. No radiopaque foreign body. EXAM: MRI OF THE LEFT FOREFOOT WITHOUT AND WITH CONTRAST TECHNIQUE: Multiplanar, multisequence MR imaging of the left forefoot was performed both before and after administration of intravenous contrast. CONTRAST:  88mL MULTIHANCE GADOBENATE DIMEGLUMINE 529 MG/ML IV SOLN COMPARISON:  None. FINDINGS: Bones/Joint/Cartilage Soft tissue wound along the plantar and dorsal medial aspect of the forefoot at the level of the MTP joint. Wound extends deep to the first MTP joint capsule. Small first MTP joint effusion with synovitis and marrow edema at the base of the first proximal phalanx and to a lesser extent first metatarsal head concerning for septic arthritis. 9 x 22 mm fluid collection along the plantar aspect of the first proximal phalanx extending into the medial soft tissues most concerning for an abscess. Large areas of hypoenhancing soft tissue along the dorsal medial aspect of the forefoot with multiple tiny foci of low signal concerning for extension of an abscess  intermixed with necrotic soft tissue. No acute fracture or dislocation. Minimal marrow edema in medial hallux sesamoid and adjacent first metatarsal head likely reflecting reactive marrow changes. Normal alignment. Ligaments Collateral ligaments are  intact.  Lisfranc ligament is intact. Muscles and Tendons Flexor, peroneal and extensor compartment tendons are intact. Soft tissue No other fluid collection or hematoma.  No soft tissue mass. IMPRESSION: 1. Soft tissue wound along the plantar and dorsal medial aspect of the forefoot at the level of the MTP joint with surrounding cellulitis. Wound extends deep to the first MTP joint capsule. Septic arthritis of the first MTP joint with adjacent marrow edema. 9 x 22 mm fluid collection along the plantar aspect of the first proximal phalanx extending into the medial and lateral soft tissues most concerning for an abscess. Large areas of hypoenhancing soft tissue along the dorsal medial aspect of the forefoot extending into the first web space with multiple tiny foci of low signal concerning for extension of an abscess intermixed with necrotic soft tissue concerning for a necrotizing infection. Electronically Signed   By: Kathreen Devoid   On: 01/13/2018 13:28   Dg Chest Port 1 View  Result Date: 01/12/2018 CLINICAL DATA:  Sepsis EXAM: PORTABLE CHEST 1 VIEW COMPARISON:  None. FINDINGS: Normal mediastinum and cardiac silhouette. Normal pulmonary vasculature. No evidence of effusion, infiltrate, or pneumothorax. No acute bony abnormality. IMPRESSION: No acute cardiopulmonary process. Electronically Signed   By: Suzy Bouchard M.D.   On: 01/12/2018 20:06   Dg Foot Complete Left  Result Date: 01/12/2018 CLINICAL DATA:  Soft tissue infection of the left foot for 1 day in a diabetic patient. Initial encounter. EXAM: LEFT FOOT - COMPLETE 3+ VIEW COMPARISON:  None. FINDINGS: A skin ulceration and soft tissue gas are seen over the dorsum of the foot at and proximal to the  great toe. No radiopaque foreign body is identified. There is no bony destructive change or periosteal reaction. 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 IMPRESSION: Findings consistent with cellulitis centered about the right great toe and first metatarsal. No plain film evidence of osteomyelitis. Moderate right first MTP osteoarthritis. Electronically Signed   By: Inge Rise M.D.   On: 01/12/2018 16:41    Microbiology: Recent Results (from the past 240 hour(s))  Blood Culture (routine x 2)     Status: None   Collection Time: 01/12/18  3:50 PM  Result Value Ref Range Status   Specimen Description BLOOD RIGHT ANTECUBITAL  Final   Special Requests   Final    BOTTLES DRAWN AEROBIC AND ANAEROBIC Blood Culture adequate volume   Culture   Final    NO GROWTH 5 DAYS Performed at [facility], [address], [place], [place] [address]    Report Status 01/17/2018 FINAL  Final  Blood Culture (routine x 2)     Status: None   Collection Time: 01/12/18  4:00 PM  Result Value Ref Range Status   Specimen Description BLOOD LEFT ARM  Final   Special Requests   Final    BOTTLES DRAWN AEROBIC AND ANAEROBIC Blood Culture adequate volume   Culture   Final    NO GROWTH 5 DAYS Performed at [facility], [place] [address], [place], [place] [address]    Report Status 01/17/2018 FINAL  Final  Aerobic/Anaerobic Culture (surgical/deep wound)     Status: None (Preliminary result)   Collection Time: 01/14/18 10:03 PM  Result Value Ref Range Status   Specimen Description ABSCESS LEFT FOOT  Final   Special Requests PATIENT ON FOLLOWING VANCOMYCIN,ZOSYN  Final   Gram Stain   Final    ABUNDANT WBC PRESENT, PREDOMINANTLY PMN MODERATE GRAM NEGATIVE RODS FEW  GRAM POSITIVE COCCI IN PAIRS    Culture   Final    MODERATE ESCHERICHIA COLI MODERATE ENTEROCOCCUS FAECALIS HOLDING FOR POSSIBLE ANAEROBE Performed at [facility], [place] [address], [place], [place] [address]    Report  Status PENDING  Incomplete   Organism ID, Bacteria ESCHERICHIA COLI  Final      Susceptibility   Escherichia coli - MIC*    AMPICILLIN >=32 RESISTANT Resistant     CEFAZOLIN 16 SENSITIVE Sensitive     CEFEPIME <=1 SENSITIVE Sensitive     CEFTAZIDIME <=1 SENSITIVE Sensitive     CEFTRIAXONE <=1 SENSITIVE Sensitive     CIPROFLOXACIN <=0.25 SENSITIVE Sensitive     GENTAMICIN <=1 SENSITIVE Sensitive     IMIPENEM <=0.25 SENSITIVE Sensitive     TRIMETH/SULFA <=20 SENSITIVE Sensitive     AMPICILLIN/SULBACTAM >=32 RESISTANT Resistant     PIP/TAZO <=4 SENSITIVE Sensitive     Extended ESBL NEGATIVE Sensitive     * MODERATE ESCHERICHIA COLI     Labs: Basic Metabolic Panel: Recent Labs  Lab 01/15/18 0708 01/18/18 0616 01/19/18 0640 01/20/18 0713  NA 134* 136 136 137  K 4.3 4.0 4.0 4.0  CL 101 101 103 105  CO2 23 26 26 25   GLUCOSE 296* 250* 237* 189*  BUN 13 16 17 19   CREATININE 1.20 1.24 1.21 1.28*  CALCIUM 7.9* 8.0* 8.0* 8.2*   Liver Function Tests: No results for input(s): AST, ALT, ALKPHOS, BILITOT, PROT, ALBUMIN in the last 168 hours. No results for input(s): LIPASE, AMYLASE in the last 168 hours. No results for input(s): AMMONIA in the last 168 hours. CBC: Recent Labs  Lab 01/15/18 0708  WBC 10.1  HGB 9.3*  HCT 28.5*  MCV 90.2  PLT 396   Cardiac Enzymes: No results for input(s): CKTOTAL, CKMB, CKMBINDEX, TROPONINI in the last 168 hours. BNP: BNP (last 3 results) No results for input(s): BNP in the last 8760 hours.  ProBNP (last 3 results) No results for input(s): PROBNP in the last 8760 hours.  CBG: Recent Labs  Lab 01/19/18 0653 01/19/18 1141 01/19/18 1629 01/19/18 2122 01/20/18 0636  GLUCAP 210* 230* 98 85 184*

## 2018-01-20 NOTE — Progress Notes (Signed)
Pt arrived to unit via wheelchair with personal belongings. Prevena wound vac on left foot and suctioning at 125 continuous. Wound vac is currently charging

## 2018-01-20 NOTE — Social Work (Signed)
CSW was advised that [facility] now has an available bed. Pt will discharge to [facility] for his rehabilitative therapies.  CSW advised SNF of same.  Elissa Hefty, LCSW Clinical Social Worker [phone]

## 2018-01-20 NOTE — Progress Notes (Signed)
Rehab admissions - I met with patient.  He prefers to come in [facility] inpatient rehab rather than go to a SNF.  I do have approval from [facility] for acute inpatient rehab admission.  I have a rehab bed available and will admit today.  Call me for questions.  #[phone]

## 2018-01-20 NOTE — H&P (Signed)
Physical Medicine and Rehabilitation Admission H&P    Chief Complaint  Patient presents with  . Debility    HPI: David Lane is a 72 year old male with history of coronary artery disease with ischemic cardiomyopathy and recent stent, hypertension, pancreatitis, type 1 diabetes mellitus, who was admitted on 01/12/2018 with left drainage with abscess, cellulitis and osteomyelitis.  He was started on IV antibiotics and underwent left transmetatarsal amputation by Dr. Sharol Given on 01/14/2018 and is  to be nonweightbearing postop.  He continues to have blood drainage from transmetatarsal site and VAC to stay in place for another week. Therapy ongoing and patient noted to have deficits in mobility and self-care tasks.  CIR was recommended for follow-up therapy.   Review of Systems  Constitutional: Negative for chills and fever.  HENT: Positive for hearing loss.   Eyes: Negative for blurred vision and double vision.  Respiratory: Negative for cough and shortness of breath.   Cardiovascular: Negative for chest pain and palpitations.  Gastrointestinal: Negative for constipation, heartburn and nausea.  Genitourinary: Negative for dysuria and urgency.  Musculoskeletal: Negative for joint pain and myalgias.  Neurological: Negative for weakness.  Psychiatric/Behavioral: The patient is nervous/anxious. The patient does not have insomnia.       Past Medical History:  Diagnosis Date  . CAD (coronary artery disease)    last cath by [facility] DR.  Mihai Croitoru showing  some  disease involving LCX and small size of Diag   . CHF exacerbation, due to diastolic dysfunction 75/64/3329  . Chronic systolic CHF (congestive heart failure), NYHA class 1 ([place]) 07/17/2012  . GERD (gastroesophageal reflux disease)   . Hepatic lesion 02/04/11  . Hyperlipemia   . Hypertension   . Ischemic cardiomyopathy    EF 35-40%  . Liver hemangioma   . NSTEMI (non-ST elevated myocardial infarction) ([place]) 11/21/2009  .  Pancreatitis 2000's  . Shortness of breath   . Type II diabetes mellitus ([place])     Past Surgical History:  Procedure Laterality Date  . AMPUTATION Left 01/14/2018   Procedure: TRANSMETATARSAL AMPUTATION;  Surgeon: Newt Minion, MD;  Location: [facility];  Service: Orthopedics;  Laterality: Left;  . CARDIAC CATHETERIZATION  2011   minimal disease, medical management  . CORONARY ANGIOPLASTY WITH STENT PLACEMENT  07/14/2012   successful PCI & stenting of mid LAD & PDA off the dominant CX  . CORONARY/GRAFT ACUTE MI REVASCULARIZATION N/A 12/20/2017   Procedure: Coronary/Graft Acute MI Revascularization;  Surgeon: Martinique, Peter M, MD;  Location: [facility] CV [facility];  Service: Cardiovascular;  Laterality: N/A;  . INCISION AND DRAINAGE ABSCESS ANAL  1970's  . LEFT HEART CATH AND CORONARY ANGIOGRAPHY N/A 12/20/2017   Procedure: LEFT HEART CATH AND CORONARY ANGIOGRAPHY;  Surgeon: Martinique, Peter M, MD;  Location: [facility] CV [facility];  Service: Cardiovascular;  Laterality: N/A;  . LEFT HEART CATHETERIZATION WITH CORONARY ANGIOGRAM N/A 07/14/2012   Procedure: LEFT HEART CATHETERIZATION WITH CORONARY ANGIOGRAM;  Surgeon: [name], MD;  Location: [facility] CATH [facility];  Service: Cardiovascular;  Laterality: N/A;  . PERCUTANEOUS CORONARY STENT INTERVENTION (PCI-S) Right 07/14/2012   Procedure: PERCUTANEOUS CORONARY STENT INTERVENTION (PCI-S);  Surgeon: [name], MD;  Location: [facility] CATH LAB;  Service: Cardiovascular;  Laterality: Right;    Family History  Problem Relation Age of Onset  . Diabetes Mother   . Diabetes Paternal Grandmother   . Diabetes Maternal Grandfather   . Colon cancer Neg Hx     Social History:  Lives alone but  girlfriend and sister have been helping out since stent.  Has been sedentary. He reports that he has never smoked. He has never used smokeless tobacco. He reports that he does not drink alcohol or use drugs.    Allergies  Allergen Reactions  . Lipitor [Atorvastatin] Other (See  Comments)    Muscle weakness  . Metformin And Related Diarrhea    Severe diarrhea    Medications Prior to Admission  Medication Sig Dispense Refill  . aspirin 81 MG tablet Take 81 mg by mouth daily.      . carvedilol (COREG) 3.125 MG tablet Take 1 tablet (3.125 mg total) by mouth 2 (two) times daily with a meal. 60 tablet 6  . Insulin Glargine (LANTUS SOLOSTAR) 100 UNIT/ML Solostar Pen Inject 15 Units into the skin daily at 10 pm. NEED APPOINTMENT (Patient taking differently: Inject 80 Units into the skin daily before breakfast. ) 9 pen 1  . lisinopril (PRINIVIL,ZESTRIL) 20 MG tablet Take 0.5 tablets (10 mg total) by mouth daily. (Patient taking differently: Take 10 mg by mouth 2 (two) times daily. ) 180 tablet 3  . pantoprazole (PROTONIX) 40 MG tablet Take 1 tablet (40 mg total) by mouth daily. 90 tablet 3  . rosuvastatin (CRESTOR) 40 MG tablet Take 1 tablet (40 mg total) by mouth at bedtime. 30 tablet 1  . ticagrelor (BRILINTA) 90 MG TABS tablet Take 1 tablet (90 mg total) by mouth 2 (two) times daily. 180 tablet 3  . glucose blood (ACCU-CHEK AVIVA) test strip 4 (four) times daily. DX code e11.9 100 each 0  . insulin aspart (NOVOLOG) 100 UNIT/ML injection Inject 5 Units into the skin 3 (three) times daily with meals. 10 mL 11  . Insulin Pen Needle (B-D UF III MINI PEN NEEDLES) 31G X 5 MM MISC use to inject insulin daily 100 each 0    Drug Regimen Review  Drug regimen was reviewed and remains appropriate with no significant issues identified  Home: Home Living Family/patient expects to be discharged to:: Inpatient rehab Living Arrangements: Alone Type of Home: House Home Access: Stairs to enter Entrance Stairs-Number of Steps: 3 Entrance Stairs-Rails: None Home Layout: One level Home Equipment: None   Functional History: Prior Function Level of Independence: Independent  Functional Status:  Mobility: Bed Mobility Overal bed mobility: Modified Independent General bed  mobility comments: pt up in recliner upon my entry Transfers Overall transfer level: Needs assistance Equipment used: Rolling walker (2 wheeled) Transfers: Sit to/from Stand Sit to Stand: Min guard General transfer comment: line mgt and cues to ensure wound vac is in safe position before standing/ambulating. Able to rise with min guard and no LOB this visit.  Ambulation/Gait Ambulation/Gait assistance: Min guard, Supervision Ambulation Distance (Feet): 50 Feet Assistive device: Rolling walker (2 wheeled) Gait Pattern/deviations: Step-to pattern General Gait Details: Patient with hop gait, cont good adherence to NWB status. chair follow as patient fatigues after 30'  Gait velocity: decreased    ADL: ADL Overall ADL's : Needs assistance/impaired Eating/Feeding: Independent Grooming: Wash/dry hands, Wash/dry face, Oral care, Supervision/safety, Sitting Grooming Details (indicate cue type and reason): pt requires (A) to safely reach sink levle and sitting to complete grooming at the sink. cues for safety during task  Upper Body Bathing: Supervision/ safety, Sitting Upper Body Bathing Details (indicate cue type and reason): sink level  Lower Body Bathing: Minimal assistance, Sit to/from stand Upper Body Dressing : Supervision/safety Lower Body Dressing: Supervision/safety, Set up Lower Body Dressing Details (indicate cue type and  reason): min guard A sit<>stand Toilet Transfer: Minimal assistance, Ambulation, RW, BSC Toilet Transfer Details (indicate cue type and reason): over toilet Functional mobility during ADLs: Minimal assistance, Rolling walker General ADL Comments: pt complete sink level groomign and bathing task. pt declined shaving after specifically requesting due to fatigue with adl task. pt states "i can't believe just this bit of doing has worn me out"  Cognition: Cognition Overall Cognitive Status: Within Functional Limits for tasks assessed Orientation Level: Oriented  X4 Cognition Arousal/Alertness: Awake/alert Behavior During Therapy: WFL for tasks assessed/performed Overall Cognitive Status: Within Functional Limits for tasks assessed   Blood pressure 131/83, pulse 84, temperature 98.2 F (36.8 C), temperature source Oral, resp. rate 18, height 6' (1.829 m), weight 88.9 kg (196 lb), SpO2 99 %. Physical Exam  Constitutional: He is oriented to person, place, and time. He appears well-developed and well-nourished.  HENT:  Head: Normocephalic and atraumatic.  Eyes: Pupils are equal, round, and reactive to light.  Neck: Normal range of motion. Neck supple.  Cardiovascular: Normal rate and regular rhythm. Exam reveals no friction rub.  No murmur heard. Respiratory: Effort normal. No respiratory distress. He has no wheezes. He has no rales.  GI: Soft. Bowel sounds are normal. He exhibits no distension. There is no tenderness.  Musculoskeletal:  Left transmetartasal amputation site with prevana VAC  in place and continues to have bloody drainage. Hard callus under right great toe. Left foot appropriately tender. No edema  Neurological: He is alert and oriented to person, place, and time. No cranial nerve deficit.  Verbose. Has poor awareness of medical issues. UE motor 5/5. LLE limited by pain but 3+ to 4/5. RLE 4/5. Decreased LT distally RLE  Skin: Skin is warm.  Psychiatric: He has a normal mood and affect.    Results for orders placed or performed during the hospital encounter of 01/12/18 (from the past 48 hour(s))  Glucose, capillary     Status: Abnormal   Collection Time: 01/18/18  4:47 PM  Result Value Ref Range   Glucose-Capillary 110 (H) 65 - 99 mg/dL  Glucose, capillary     Status: None   Collection Time: 01/18/18  9:11 PM  Result Value Ref Range   Glucose-Capillary 97 65 - 99 mg/dL  Basic metabolic panel     Status: Abnormal   Collection Time: 01/19/18  6:40 AM  Result Value Ref Range   Sodium 136 135 - 145 mmol/L   Potassium 4.0 3.5 -  5.1 mmol/L   Chloride 103 101 - 111 mmol/L   CO2 26 22 - 32 mmol/L   Glucose, Bld 237 (H) 65 - 99 mg/dL   BUN 17 6 - 20 mg/dL   Creatinine, Ser 1.21 0.61 - 1.24 mg/dL   Calcium 8.0 (L) 8.9 - 10.3 mg/dL   GFR calc non Af Amer 59 (L) >60 mL/min   GFR calc Af Amer >60 >60 mL/min    Comment: (NOTE) The eGFR has been calculated using the CKD EPI equation. This calculation has not been validated in all clinical situations. eGFR's persistently <60 mL/min signify possible Chronic Kidney Disease.    Anion gap 7 5 - 15    Comment: Performed at [facility] [address], [place], [place] [address]  Glucose, capillary     Status: Abnormal   Collection Time: 01/19/18  6:53 AM  Result Value Ref Range   Glucose-Capillary 210 (H) 65 - 99 mg/dL  Glucose, capillary     Status: Abnormal  Collection Time: 01/19/18 11:41 AM  Result Value Ref Range   Glucose-Capillary 230 (H) 65 - 99 mg/dL  Glucose, capillary     Status: None   Collection Time: 01/19/18  4:29 PM  Result Value Ref Range   Glucose-Capillary 98 65 - 99 mg/dL  Glucose, capillary     Status: None   Collection Time: 01/19/18  9:22 PM  Result Value Ref Range   Glucose-Capillary 85 65 - 99 mg/dL  Glucose, capillary     Status: Abnormal   Collection Time: 01/20/18  6:36 AM  Result Value Ref Range   Glucose-Capillary 184 (H) 65 - 99 mg/dL  Basic metabolic panel     Status: Abnormal   Collection Time: 01/20/18  7:13 AM  Result Value Ref Range   Sodium 137 135 - 145 mmol/L   Potassium 4.0 3.5 - 5.1 mmol/L   Chloride 105 101 - 111 mmol/L   CO2 25 22 - 32 mmol/L   Glucose, Bld 189 (H) 65 - 99 mg/dL   BUN 19 6 - 20 mg/dL   Creatinine, Ser 1.28 (H) 0.61 - 1.24 mg/dL   Calcium 8.2 (L) 8.9 - 10.3 mg/dL   GFR calc non Af Amer 55 (L) >60 mL/min   GFR calc Af Amer >60 >60 mL/min    Comment: (NOTE) The eGFR has been calculated using the CKD EPI equation. This calculation has not been validated in all clinical situations. eGFR's  persistently <60 mL/min signify possible Chronic Kidney Disease.    Anion gap 7 5 - 15    Comment: Performed at [facility] [address], [place], [place] [address]  Glucose, capillary     Status: Abnormal   Collection Time: 01/20/18 11:56 AM  Result Value Ref Range   Glucose-Capillary 139 (H) 65 - 99 mg/dL   Comment 1 Notify RN    Comment 2 Document in Chart    No results found.     Medical Problem List and Plan: 1.  Functional deficits secondary to left transmetatarsal amputation  -admit to inpatient rehab 2.  DVT Prophylaxis/Anticoagulation: Pharmaceutical: Lovenox 3. Pain Management:  4. Mood: LCSW to follow for evaluation and support  5. Neuropsych: This patient is capable of making decisions on his own behalf. 6. Skin/Wound Care: Continue Prevena VAC. Follow up with surgical team re: duration of use 7. Fluids/Electrolytes/Nutrition: Monitor I/O daily. Offer supplements if intake poor. Will add for proteins supplement.  8. T1DM poorly controlled:  Monitor BS ac/hs. Continue Lantus bid with meal coverage for better control. Will consult dietician for education.  9. HTN: Monitor BP bid. Continue coreg bid 10 Chronic combined systolic diastolic CHF: Heart healthy diet--compensated on coreg, Crestor and ASA. Off spironolactone and lisinopril at this time due to issue with hypotension.  Monitor weight daily.  11. CAD s/p recent stent: On Brilinta, ASA and Crestor.   12 ABLA: Recheck CBC in am 13. AKI: Due to vancomycin. Encourage fluid intake. Continue to monitor closely for recovery.   Post Admission Physician Evaluation: 1. Functional deficits secondary  to left TMA. 2. Patient is admitted to receive collaborative, interdisciplinary care between the physiatrist, rehab nursing staff, and therapy team. 3. Patient's level of medical complexity and substantial therapy needs in context of that medical necessity cannot be provided at a lesser intensity of care such as a  SNF. 4. Patient has experienced substantial functional loss from his/her baseline which was documented above under the "Functional History" and "Functional Status" headings.  Judging  by the patient's diagnosis, physical exam, and functional history, the patient has potential for functional progress which will result in measurable gains while on inpatient rehab.  These gains will be of substantial and practical use upon discharge  in facilitating mobility and self-care at the household level. 5. Physiatrist will provide 24 hour management of medical needs as well as oversight of the therapy plan/treatment and provide guidance as appropriate regarding the interaction of the two. 6. The Preadmission Screening has been reviewed and patient status is unchanged unless otherwise stated above. 7. 24 hour rehab nursing will assist with bladder management, bowel management, safety, skin/wound care, disease management, medication administration, pain management and patient education  and help integrate therapy concepts, techniques,education, etc. 8. PT will assess and treat for/with: Lower extremity strength, range of motion, stamina, balance, functional mobility, safety, adaptive techniques and equipment, post-op precautions, wound mgt, community reentry, ego support.   Goals are: mod I. 9. OT will assess and treat for/with: ADL's, functional mobility, safety, upper extremity strength, adaptive techniques and equipment, pain mgt, post-op precautions, community reentry.   Goals are: mod I. Therapy may not yet proceed with showering this patient. 10. SLP will assess and treat for/with: n/a.  Goals are: n/a. 11. Case Management and Social Worker will assess and treat for psychological issues and discharge planning. 12. Team conference will be held weekly to assess progress toward goals and to determine barriers to discharge. 13. Patient will receive at least 3 hours of therapy per day at least 5 days per  week. 14. ELOS: 7-10 days       15. Prognosis:  excellent   I have personally performed a face to face diagnostic evaluation of this patient. Additionally, I have reviewed and concur with the physician assistant's documentation above.  Meredith Staggers, MD, Mellody Drown   Bary Leriche, PA-C 01/20/2018

## 2018-01-20 NOTE — Progress Notes (Signed)
Physical Therapy Treatment Patient Details Name: David Lane MRN: [id number] DOB: December 13, 1945 Today's Date: 01/20/2018    History of Present Illness 72 year old male with past medical history significant forCADwith recent STEMI (3/26-31) s/p stenton Brilinta, HTN, HLD; combined CHF(EF 35-40% with grade 1 diastolic dysfunction on 6/16), DM who presented with cellulitisand left foot wound.W/up revealed osteomyelitis.  Dr. Sharol Given did transmet amputation on 01/14/18 .      PT Comments    Patient progressing with therapy today, progressing to close supervision with intermittent min guard for short bouts of ambulation. Patient fatigues quickly with single leg hop gait and use of RW and will benefit from continued therapy to strengthen BLE and improve activity tolerance and balance.  Per chart, CIR has offered bed to patient, feel he will do very well in rehab, eager to work with therapy and appreciative of our care.    Follow Up Recommendations  CIR;Supervision/Assistance - 24 hour     Equipment Recommendations  Rolling walker with 5" wheels    Recommendations for Other Services       Precautions / Restrictions Precautions Precautions: Fall Restrictions Weight Bearing Restrictions: Yes LUE Weight Bearing: Non weight bearing    Mobility  Bed Mobility Overal bed mobility: Modified Independent                Transfers Overall transfer level: Needs assistance Equipment used: Rolling walker (2 wheeled) Transfers: Sit to/from Stand Sit to Stand: Min guard         General transfer comment: line mgt and cues to ensure wound vac is in safe position before standing/ambulating. Able to rise with min guard and no LOB this visit.   Ambulation/Gait Ambulation/Gait assistance: Min guard;Supervision Ambulation Distance (Feet): 50 Feet Assistive device: Rolling walker (2 wheeled) Gait Pattern/deviations: Step-to pattern Gait velocity: decreased   General Gait Details:  Patient with hop gait, cont good adherence to NWB status. chair follow as patient fatigues after 30'    Stairs             Wheelchair Mobility    Modified Rankin (Stroke Patients Only)       Balance Overall balance assessment: Needs assistance Sitting-balance support: No upper extremity supported;Feet supported Sitting balance-Leahy Scale: Good     Standing balance support: Single extremity supported Standing balance-Leahy Scale: Poor Standing balance comment: dependent on RW or hand on sink for stability                            Cognition Arousal/Alertness: Awake/alert Behavior During Therapy: WFL for tasks assessed/performed Overall Cognitive Status: Within Functional Limits for tasks assessed                                        Exercises General Exercises - Lower Extremity Long Arc Quad: AROM;10 reps;Both;Seated Hip ABduction/ADduction: AROM;10 reps;Both;Seated Straight Leg Raises: AROM;10 reps;Both;Seated    General Comments        Pertinent Vitals/Pain Pain Assessment: Faces Faces Pain Scale: Hurts a little bit Pain Location: L foot Pain Descriptors / Indicators: Discomfort Pain Intervention(s): Monitored during session;Limited activity within patient's tolerance;Premedicated before session;Repositioned    Home Living                      Prior Function            PT Goals (  current goals can now be found in the care plan section) Acute Rehab PT Goals [name] PT Goal Formulation: With patient Time For Goal Achievement: 01/29/18 Potential to Achieve Goals: Good Progress towards PT goals: Progressing toward goals    Frequency    Min 3X/week      PT Plan Discharge plan needs to be updated    Co-evaluation              AM-PAC PT "6 Clicks" Daily Activity  Outcome Measure  Difficulty turning over in bed (including adjusting bedclothes, sheets and blankets)?:  None Difficulty moving from lying on back to sitting on the side of the bed? : None Difficulty sitting down on and standing up from a chair with arms (e.g., wheelchair, bedside commode, etc,.)?: A Little Help needed moving to and from a bed to chair (including a wheelchair)?: A Little Help needed walking in hospital room?: A Lot Help needed climbing 3-5 steps with a railing? : Total 6 Click Score: 17    End of Session Equipment Utilized During Treatment: Gait belt Activity Tolerance: Patient tolerated treatment well Patient left: in chair;with call bell/phone within reach Nurse Communication: Mobility status PT Visit Diagnosis: Unsteadiness on feet (R26.81);Other abnormalities of gait and mobility (R26.89)     Time: 7867-6720 PT Time Calculation (min) (ACUTE ONLY): 19 min  Charges:  $Gait Training: 8-22 mins                    G Codes:       Reinaldo Berber, PT, DPT Acute Rehab Services Pager: [phone]     Reinaldo Berber 01/20/2018, 12:07 PM

## 2018-01-20 NOTE — PMR Pre-admission (Signed)
PMR Admission Coordinator Pre-Admission Assessment  Patient: David Lane is an 72 y.o., male MRN: [id number] DOB: 1946-09-17 Height: 6' (182.9 cm) Weight: 88.9 kg (196 lb)             Insurance Information HMO: Yes   PPO:       PCP:       IPA:       80/20:       OTHER:  Group # [id number] PRIMARY: BCBS Medicare      Policy#: [id number]      Subscriber:  patient CM Name: Limmie Patricia      Phone#: [phone]     Fax#: [phone] Pre-Cert#:                                Employer:  Retired Benefits:  Phone #:  [phone]     Name:  On line Eff. Date:  09/27/17     Deduct:  $0      Out of Pocket Max: $5500 (met $1603.40)      Life Max: N/A CIR: $310 days 1-6      SNF: $0 days 1-20; $172 days 21-60; $0 days 61-100 Outpatient: medical necessity     Co-Pay: $40/visit Home Health: 100%      Co-Pay: none DME: 80%     Co-Pay: 20% Providers: in network  Emergency Contact Information Contact Information    Name Relation Home Work Mobile   Rose,Anna Sister [place] other   [phone]     Current Medical History  Patient Admitting Diagnosis: L TMA  History of Present Illness:  A 72 y.o. male with history of CAD with ICM--recent stent,  HTN, pancreatitis, T1DM who was admitted on 01/12/18 with left foot drainage with abscess,  cellulitis and osteomyelitis. He was started on IV antibiotics and underwent transmetatarsal amputation left foot by Dr. Sharol Given on 04/20. To be NWB with recommendations to continue antibiotics for 3 days post op.   PT evaluation done and CIR recommended due to functional deficits.   Past Medical History  Past Medical History:  Diagnosis Date  . CAD (coronary artery disease)    last cath by [facility] DR.  Mihai Croitoru showing  some  disease involving LCX and small size of Diag   . CHF exacerbation, due to diastolic dysfunction 51/70/0174  . Chronic systolic CHF (congestive heart failure), NYHA class 1 ([place]) 07/17/2012  . GERD  (gastroesophageal reflux disease)   . Hepatic lesion 02/04/11  . Hyperlipemia   . Hypertension   . Ischemic cardiomyopathy    EF 35-40%  . Liver hemangioma   . NSTEMI (non-ST elevated myocardial infarction) ([place]) 11/21/2009  . Pancreatitis 2000's  . Shortness of breath   . Type II diabetes mellitus (HCC)     Family History  family history includes Diabetes in his maternal grandfather, mother, and paternal grandmother.  Prior Rehab/Hospitalizations: No previous rehab  Has the patient had major surgery during 100 days prior to admission? Yes.  Had a stent replaced in his heart about 1 week before admission to acute care hospital.  Current Medications   Current Facility-Administered Medications:  .  0.9 %  sodium chloride infusion, , Intravenous, Continuous, Newt Minion, MD, Last Rate: 10 mL/hr at 01/16/18 0004 .  acetaminophen (TYLENOL) tablet 325-650 mg, 325-650 mg, Oral, Q6H PRN, Newt Minion, MD, 650 mg at 01/14/18 1650 .  aspirin chewable  tablet 81 mg, 81 mg, Oral, Daily, Newt Minion, MD, 81 mg at 01/20/18 0957 .  bisacodyl (DULCOLAX) suppository 10 mg, 10 mg, Rectal, Daily PRN, Newt Minion, MD .  carvedilol (COREG) tablet 3.125 mg, 3.125 mg, Oral, BID WC, Newt Minion, MD, 3.125 mg at 01/20/18 0819 .  docusate sodium (COLACE) capsule 100 mg, 100 mg, Oral, BID, Newt Minion, MD, 100 mg at 01/18/18 0807 .  feeding supplement (PRO-STAT SUGAR FREE 64) liquid 30 mL, 30 mL, Oral, BID, Newt Minion, MD, 30 mL at 01/20/18 0957 .  HYDROcodone-acetaminophen (NORCO) 7.5-325 MG per tablet 1-2 tablet, 1-2 tablet, Oral, Q4H PRN, Newt Minion, MD .  HYDROcodone-acetaminophen (NORCO/VICODIN) 5-325 MG per tablet 1-2 tablet, 1-2 tablet, Oral, Q4H PRN, Newt Minion, MD, 1 tablet at 01/18/18 1023 .  insulin aspart (novoLOG) injection 0-5 Units, 0-5 Units, Subcutaneous, QHS, Newt Minion, MD, 5 Units at 01/18/18 0126 .  insulin aspart (novoLOG) injection 0-9 Units, 0-9 Units,  Subcutaneous, TID WC, Newt Minion, MD, 1 Units at 01/20/18 1235 .  insulin aspart (novoLOG) injection 7 Units, 7 Units, Subcutaneous, TID WC, Robbie Lis, MD, 7 Units at 01/20/18 1235 .  insulin glargine (LANTUS) injection 5 Units, 5 Units, Subcutaneous, QHS, Robbie Lis, MD .  insulin glargine (LANTUS) injection 50 Units, 50 Units, Subcutaneous, Daily, Robbie Lis, MD, 50 Units at 01/20/18 [phone] .  lactated ringers infusion, , Intravenous, Continuous, Newt Minion, MD, Last Rate: 75 mL/hr at 01/14/18 1656 .  magnesium citrate solution 1 Bottle, 1 Bottle, Oral, Once PRN, Newt Minion, MD .  methocarbamol (ROBAXIN) tablet 500 mg, 500 mg, Oral, Q6H PRN **OR** methocarbamol (ROBAXIN) 500 mg in dextrose 5 % 50 mL IVPB, 500 mg, Intravenous, Q6H PRN, Newt Minion, MD .  metoCLOPramide (REGLAN) tablet 5-10 mg, 5-10 mg, Oral, Q8H PRN **OR** metoCLOPramide (REGLAN) injection 5-10 mg, 5-10 mg, Intravenous, Q8H PRN, Newt Minion, MD .  morphine 2 MG/ML injection 0.5-1 mg, 0.5-1 mg, Intravenous, Q2H PRN, Newt Minion, MD .  ondansetron (ZOFRAN) tablet 4 mg, 4 mg, Oral, Q6H PRN **OR** ondansetron (ZOFRAN) injection 4 mg, 4 mg, Intravenous, Q6H PRN, Newt Minion, MD .  pantoprazole (PROTONIX) EC tablet 40 mg, 40 mg, Oral, Daily, Newt Minion, MD, 40 mg at 01/20/18 0957 .  polyethylene glycol (MIRALAX / GLYCOLAX) packet 17 g, 17 g, Oral, Daily PRN, Newt Minion, MD .  polyvinyl alcohol (LIQUIFILM TEARS) 1.4 % ophthalmic solution 1 drop, 1 drop, Both Eyes, PRN, Newt Minion, MD, 1 drop at 01/15/18 0912 .  rosuvastatin (CRESTOR) tablet 40 mg, 40 mg, Oral, QHS, Newt Minion, MD, 40 mg at 01/19/18 2210 .  ticagrelor (BRILINTA) tablet 90 mg, 90 mg, Oral, BID, Newt Minion, MD, 90 mg at 01/20/18 2297  Patients Current Diet: Diet Carb Modified Fluid consistency: Thin; Room service appropriate? Yes Diet - low sodium heart healthy  Precautions / Restrictions Precautions Precautions:  Fall Precaution Comments: pt with would vac present and amputation of midfoot on L Restrictions Weight Bearing Restrictions: Yes LUE Weight Bearing: Non weight bearing   Has the patient had 2 or more falls or a fall with injury in the past year?No.  Has 1 fall on day of admission with bump on his head.  Prior Activity Level Community (5-7x/wk): Went out daily, was driving.  Home Assistive Devices / Equipment Home Assistive Devices/Equipment: CBG Meter Home Equipment: None  Prior  Device Use: Indicate devices/aids used by the patient prior to current illness, exacerbation or injury? None  Prior Functional Level Prior Function Level of Independence: Independent  Self Care: Did the patient need help bathing, dressing, using the toilet or eating?  Independent  Indoor Mobility: Did the patient need assistance with walking from room to room (with or without device)? Independent  Stairs: Did the patient need assistance with internal or external stairs (with or without device)? Independent  Functional Cognition: Did the patient need help planning regular tasks such as shopping or remembering to take medications? Independent  Current Functional Level Cognition  Overall Cognitive Status: Within Functional Limits for tasks assessed Orientation Level: Oriented X4    Extremity Assessment (includes Sensation/Coordination)  Upper Extremity Assessment: Overall WFL for tasks assessed  Lower Extremity Assessment: Defer to PT evaluation    ADLs  Overall ADL's : Needs assistance/impaired Eating/Feeding: Independent Grooming: Wash/dry hands, Wash/dry face, Oral care, Supervision/safety, Sitting Grooming Details (indicate cue type and reason): pt requires (A) to safely reach sink levle and sitting to complete grooming at the sink. cues for safety during task  Upper Body Bathing: Supervision/ safety, Sitting Upper Body Bathing Details (indicate cue type and reason): sink level  Lower Body  Bathing: Minimal assistance, Sit to/from stand Upper Body Dressing : Supervision/safety Lower Body Dressing: Supervision/safety, Set up Lower Body Dressing Details (indicate cue type and reason): min guard A sit<>stand Toilet Transfer: Minimal assistance, Ambulation, RW, BSC Toilet Transfer Details (indicate cue type and reason): over toilet Functional mobility during ADLs: Minimal assistance, Rolling walker General ADL Comments: pt complete sink level groomign and bathing task. pt declined shaving after specifically requesting due to fatigue with adl task. pt states "i can't believe just this bit of doing has worn me out"    Mobility  Overal bed mobility: Modified Independent General bed mobility comments: pt up in recliner upon my entry    Transfers  Overall transfer level: Needs assistance Equipment used: Rolling walker (2 wheeled) Transfers: Sit to/from Stand Sit to Stand: Min guard General transfer comment: line mgt and cues to ensure wound vac is in safe position before standing/ambulating. Able to rise with min guard and no LOB this visit.     Ambulation / Gait / Stairs / Wheelchair Mobility  Ambulation/Gait Ambulation/Gait assistance: Min guard, Supervision Ambulation Distance (Feet): 50 Feet Assistive device: Rolling walker (2 wheeled) Gait Pattern/deviations: Step-to pattern General Gait Details: Patient with hop gait, cont good adherence to NWB status. chair follow as patient fatigues after 30'  Gait velocity: decreased    Posture / Balance Balance Overall balance assessment: Needs assistance Sitting-balance support: No upper extremity supported, Feet supported Sitting balance-Leahy Scale: Good Standing balance support: Single extremity supported Standing balance-Leahy Scale: Poor Standing balance comment: dependent on RW or hand on sink for stability    Special needs/care consideration BiPAP/CPAP No CPM No Continuous Drip IV KVO Dialysis No      Life Vest  No Oxygen No Special Bed No Trach Size No Wound Vac (area) Yes to Left TMA site     Skin Has a VAC dressing to left TMA site                            Bowel mgmt: Last BM 01/19/18 Bladder mgmt: Voiding in urinal Diabetic mgmt Yes, on insulin at home    Previous Home Environment Living Arrangements: Alone Type of Home: House Home Layout: One level Home Access:  Stairs to enter Entrance Stairs-Rails: None Entrance Stairs-Number of Steps: 3 Home Care Services: No  Discharge Living Setting Plans for Discharge Living Setting: Patient's home, Alone, House(Lives alone.) Type of Home at Discharge: House Discharge Home Layout: One level Discharge Home Access: Stairs to enter Entrance Stairs-Number of Steps: 2 steps Does the patient have any problems obtaining your medications?: No  Social/Family/Support Systems Patient Roles: Other (Comment)(Has a sister and a significant other.) Contact Information: Vertell Limber - sister - [phone] Anticipated Caregiver: self, sister and significant other Ability/Limitations of Caregiver: Sister has been helping with bill paying.  Sister and girlfriend could help prn. Caregiver Availability: Intermittent Discharge Plan Discussed with Primary Caregiver: Yes Is Caregiver In Agreement with Plan?: Yes Does Caregiver/Family have Issues with Lodging/Transportation while Pt is in Rehab?: No  Goals/Additional Needs Patient/Family Goal for Rehab: PT/OT mod I goals Expected length of stay: 7-10 days Cultural Considerations: [employer] Dietary Needs: Carb mod, med cal, thin liquids Equipment Needs: TBD Pt/Family Agrees to Admission and willing to participate: Yes Program Orientation Provided & Reviewed with Pt/Caregiver Including Roles  & Responsibilities: Yes  Decrease burden of Care through IP rehab admission: N/A  Possible need for SNF placement upon discharge: Not anticipated  Patient Condition: This patient's medical and functional status has  changed since the consult dated: 01/16/18 in which the Rehabilitation Physician determined and documented that the patient's condition is appropriate for intensive rehabilitative care in an inpatient rehabilitation facility. See "History of Present Illness" (above) for medical update. Functional changes are:  Currently requiring minguard to supervision to ambulate 50 feet RW. Patient's medical and functional status update has been discussed with the Rehabilitation physician and patient remains appropriate for inpatient rehabilitation. Will admit to inpatient rehab today.  Preadmission Screen Completed By:  [name], 01/20/2018 1:15 PM ______________________________________________________________________   Discussed status with Dr. Naaman Plummer on 01/20/18 at 1314 and received telephone approval for admission today.  Admission Coordinator:  [name], time 1315/Date 01/20/18

## 2018-01-20 NOTE — Social Work (Signed)
CSW received Insurance Auth [id number], 3 days approved, clinical faxed to [phone] on 4/28, Juliann Pulse [place] [phone], [place], RVA 554 therapymin per week.  CSW will f/u for disposition as SNF has bed and Insurance auth received.  Elissa Hefty, LCSW Clinical Social Worker [phone]

## 2018-01-21 ENCOUNTER — Inpatient Hospital Stay (HOSPITAL_COMMUNITY): Payer: Medicare Other | Admitting: Physical Therapy

## 2018-01-21 ENCOUNTER — Other Ambulatory Visit: Payer: Self-pay

## 2018-01-21 ENCOUNTER — Inpatient Hospital Stay (HOSPITAL_COMMUNITY): Payer: Medicare Other

## 2018-01-21 LAB — AEROBIC/ANAEROBIC CULTURE W GRAM STAIN (SURGICAL/DEEP WOUND)

## 2018-01-21 LAB — COMPREHENSIVE METABOLIC PANEL
ALK PHOS: 60 U/L (ref 38–126)
ALT: 25 U/L (ref 17–63)
AST: 26 U/L (ref 15–41)
Albumin: 2.1 g/dL — ABNORMAL LOW (ref 3.5–5.0)
Anion gap: 7 (ref 5–15)
BUN: 20 mg/dL (ref 6–20)
CALCIUM: 8.3 mg/dL — AB (ref 8.9–10.3)
CHLORIDE: 102 mmol/L (ref 101–111)
CO2: 28 mmol/L (ref 22–32)
CREATININE: 1.2 mg/dL (ref 0.61–1.24)
GFR calc Af Amer: >60 mL/min (ref >60)
GFR, EST NON AFRICAN AMERICAN: 60 mL/min — AB (ref >60)
Glucose, Bld: 114 mg/dL — ABNORMAL HIGH (ref 65–99)
Potassium: 3.9 mmol/L (ref 3.5–5.1)
Sodium: 137 mmol/L (ref 135–145)
Total Bilirubin: 0.5 mg/dL (ref 0.3–1.2)
Total Protein: 6.6 g/dL (ref 6.5–8.1)

## 2018-01-21 LAB — CBC WITH DIFFERENTIAL/PLATELET
BASOS ABS: 0 10*3/uL (ref 0.0–0.1)
Basophils Relative: 0 %
Eosinophils Absolute: 0.2 10*3/uL (ref 0.0–0.7)
Eosinophils Relative: 2 %
HCT: 31 % — ABNORMAL LOW (ref 39.0–52.0)
HEMOGLOBIN: 9.8 g/dL — AB (ref 13.0–17.0)
LYMPHS ABS: 2.2 10*3/uL (ref 0.7–4.0)
LYMPHS PCT: 28 %
MCH: 29.7 pg (ref 26.0–34.0)
MCHC: 31.6 g/dL (ref 30.0–36.0)
MCV: 93.9 fL (ref 78.0–100.0)
Monocytes Absolute: 0.7 10*3/uL (ref 0.1–1.0)
Monocytes Relative: 8 %
NEUTROS ABS: 4.9 10*3/uL (ref 1.7–7.7)
NEUTROS PCT: 62 %
PLATELETS: 480 10*3/uL — AB (ref 150–400)
RBC: 3.3 MIL/uL — AB (ref 4.22–5.81)
RDW: 13.5 % (ref 11.5–15.5)
WBC: 7.9 10*3/uL (ref 4.0–10.5)

## 2018-01-21 LAB — AEROBIC/ANAEROBIC CULTURE (SURGICAL/DEEP WOUND)

## 2018-01-21 LAB — GLUCOSE, CAPILLARY
Glucose-Capillary: 109 mg/dL — ABNORMAL HIGH (ref 65–99)
Glucose-Capillary: 243 mg/dL — ABNORMAL HIGH (ref 65–99)
Glucose-Capillary: 220 mg/dL — ABNORMAL HIGH (ref 65–99)
Glucose-Capillary: 142 mg/dL — ABNORMAL HIGH (ref 65–99)

## 2018-01-21 NOTE — Progress Notes (Signed)
Occupational Therapy Session Note  Patient Details  Name: David Lane MRN: [id number] Date of Birth: 10/12/1945  Today's Date: 01/21/2018 OT Individual Time: 1400-1425 OT Individual Time Calculation (min): 25 min    Short Term Goals: Week 1:  OT Short Term Goal 1 (Week 1): n/a d/t ELOS  Skilled Therapeutic Interventions/Progress Updates:    1:1. Pt propels w/c to/from ADL apartment with supervision and Vc for w/c parts management in prep for transfers. Pt stand pivot transfer with CGA as pt attemptsing to adjust pants while pivoting and loses balance forward. OT discusses doing 1 thing at a time d/t decreased balance and WB precautions to decrease fall risk. OT demo options for DME in shower once foot heals/cleared to shower. Pt completes squat pivot transfer w/c>TTB with supervision and min VC for sequencing. Exited session with pt seated in w/c, call light in reach and all needs met.  Therapy Documentation Precautions:  Precautions Precautions: Fall Precaution Comments: Wound vac, L midfoot amputation Restrictions Weight Bearing Restrictions: Yes LUE Weight Bearing: Non weight bearing LLE Weight Bearing: Non weight bearing General:    See Function Navigator for Current Functional Status.   Therapy/Group: Individual Therapy  Tonny Branch 01/21/2018, 2:31 PM

## 2018-01-21 NOTE — Evaluation (Signed)
Occupational Therapy Assessment and Plan  Patient Details  Name: David Lane MRN: [id number] Date of Birth: Sep 13, 1946  OT Diagnosis: muscle weakness (generalized) Rehab Potential:   ELOS: 7-9 days   Today's Date: 01/21/2018 OT Individual Time: 0800-0900 OT Individual Time Calculation (min): 60 min     Problem List:  Patient Active Problem List   Diagnosis Date Noted  . Functional gait disorder 01/20/2018  . Acute blood loss anemia 01/20/2018  . Status post transmetatarsal amputation of left foot ([place]) 01/20/2018  . Cutaneous abscess of left foot   . Subacute osteomyelitis, left ankle and foot ([place])   . Sepsis due to cellulitis ([place]) 01/12/2018  . Foot ulcer due to secondary DM ([place]) 01/04/2018  . Status post coronary artery stent placement   . Acute ST elevation myocardial infarction (STEMI) involving left anterior descending (LAD) coronary artery ([place]) 12/20/2017  . 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 ([place]) 12/14/2015  . Wellness examination 11/25/2014  . Screening for prostate cancer 11/25/2014  . Pancreatic insufficiency 01/25/2013  . Ischemic cardiomyopathy, EF 50% by echo 08/01/13 07/19/2012  . Chronic combined systolic and diastolic CHF (congestive heart failure) ([place]) 07/14/2012  . Pulmonary edema, most likely due to diastolic dysfunction 92/07/9416  . CAD, 07/14/12- LAD/PDA DES   . ANXIETY 12/12/2009  . Hyperlipidemia 04/24/2007  . Essential hypertension 04/24/2007  . PANCREATITIS, HX OF 04/24/2007    Past Medical History:  Past Medical History:  Diagnosis Date  . CAD (coronary artery disease)    last cath by [facility] DR.  Mihai Croitoru showing  some  disease involving LCX and small size of Diag   . CHF exacerbation, due to diastolic dysfunction 40/81/4481  . Chronic systolic CHF (congestive heart failure), NYHA class 1 ([place]) 07/17/2012  . GERD (gastroesophageal reflux disease)   . Hepatic lesion 02/04/11  . Hyperlipemia   . Hypertension    . Ischemic cardiomyopathy    EF 35-40%  . Liver hemangioma   . NSTEMI (non-ST elevated myocardial infarction) ([place]) 11/21/2009  . Pancreatitis 2000's  . Shortness of breath   . Type II diabetes mellitus ([place])    Past Surgical History:  Past Surgical History:  Procedure Laterality Date  . AMPUTATION Left 01/14/2018   Procedure: TRANSMETATARSAL AMPUTATION;  Surgeon: Newt Minion, MD;  Location: [place];  Service: Orthopedics;  Laterality: Left;  . CARDIAC CATHETERIZATION  2011   minimal disease, medical management  . CORONARY ANGIOPLASTY WITH STENT PLACEMENT  07/14/2012   successful PCI & stenting of mid LAD & PDA off the dominant CX  . CORONARY/GRAFT ACUTE MI REVASCULARIZATION N/A 12/20/2017   Procedure: Coronary/Graft Acute MI Revascularization;  Surgeon: Martinique, Peter M, MD;  Location: [place] CV LAB;  Service: Cardiovascular;  Laterality: N/A;  . INCISION AND DRAINAGE ABSCESS ANAL  1970's  . LEFT HEART CATH AND CORONARY ANGIOGRAPHY N/A 12/20/2017   Procedure: LEFT HEART CATH AND CORONARY ANGIOGRAPHY;  Surgeon: Martinique, Peter M, MD;  Location: [place] [facility];  Service: Cardiovascular;  Laterality: N/A;  . LEFT HEART CATHETERIZATION WITH CORONARY ANGIOGRAM N/A 07/14/2012   Procedure: LEFT HEART CATHETERIZATION WITH CORONARY ANGIOGRAM;  Surgeon: [name], MD;  Location: [place] CATH LAB;  Service: Cardiovascular;  Laterality: N/A;  . PERCUTANEOUS CORONARY STENT INTERVENTION (PCI-S) Right 07/14/2012   Procedure: PERCUTANEOUS CORONARY STENT INTERVENTION (PCI-S);  Surgeon: [name], MD;  Location: [facility] CATH LAB;  Service: Cardiovascular;  Laterality: Right;    Assessment & Plan Clinical Impression:  David Lane is a  72 year old male with history of coronary artery disease with ischemic cardiomyopathy and recent stent, hypertension, pancreatitis, type 1 diabetes mellitus, who was admitted on 01/12/2018 with left drainage with abscess, cellulitis and osteomyelitis.  He was  started on IV antibiotics and underwent left transmetatarsal amputation by Dr. Sharol Given on 01/14/2018 and is  to be nonweightbearing postop.  He continues to have blood drainage from transmetatarsal site and VAC to stay in place for another week. Therapy ongoing and patient noted to have deficits in mobility and self-care tasks.  CIR was recommended for follow-up therapy.    Patient currently requires supervision with basic self-care skills secondary to muscle weakness, decreased cardiorespiratoy endurance, decreased safety awareness and decreased standing balance and decreased balance strategies.  Prior to hospitalization, patient could complete BADLs with independent .  Patient will benefit from skilled intervention to increase independence with basic self-care skills prior to discharge home independently.    OT - End of Session Activity Tolerance: Tolerates 30+ min activity without fatigue Endurance Deficit: Yes OT Assessment OT Barriers to Discharge: Inaccessible home environment;Decreased caregiver support;Home environment access/layout;Lack of/limited family support OT Patient demonstrates impairments in the following area(s): Balance;Safety;Endurance OT Basic ADL's Functional Problem(s): Grooming;Bathing;Dressing;Toileting OT Advanced ADL's Functional Problem(s): Simple Meal Preparation OT Transfers Functional Problem(s): Toilet;Tub/Shower OT Additional Impairment(s): None OT Plan OT Intensity: Minimum of 1-2 x/day, 45 to 90 minutes OT Frequency: 5 out of 7 days OT Duration/Estimated Length of Stay: 7-9 days OT Treatment/Interventions: Balance/vestibular training;Disease mangement/prevention;Self Care/advanced ADL retraining;Therapeutic Exercise;Wheelchair propulsion/positioning;DME/adaptive equipment instruction;Pain management;Skin care/wound managment;UE/LE Strength taining/ROM;Community reintegration;Patient/family education;UE/LE Coordination activities;Discharge planning;Functional  mobility training;Therapeutic Activities OT Basic Self-Care Anticipated Outcome(s): MOD I OT Toileting Anticipated Outcome(s): MOD I OT Bathroom Transfers Anticipated Outcome(s): MOD I OT Recommendation Recommendations for Other Services: Therapeutic Recreation consult Therapeutic Recreation Interventions: Pet therapy Follow Up Recommendations: Home health OT Equipment Recommended: 3 in 1 bedside comode;Tub/shower bench;To be determined   Skilled Therapeutic Intervention 1:1. Pt educated on role/purpse of OT, WB precautions, POC, CIR, and ELOS.  Pt completes stand pivot transfer without RW even though it is set up. OT discusses importance of use of Pt apprehensive to complete BADLs, however completes bathing and dressing at sink with supervision with OT standing behind curtain observing feet for WB precautions. OT threads wound vac into pants. Pt completes oral care in seated in sitting with set up. Eixted session with pt seated in bed with exit alarm on and all needs met.   OT Evaluation Precautions/Restrictions  Precautions Precautions: Fall Precaution Comments: pt with would vac present and amputation of midfoot on L Restrictions Weight Bearing Restrictions: Yes LLE Weight Bearing: Non weight bearing General   Vital Signs Therapy Vitals Temp: 97.9 F (36.6 C) Temp Source: Oral Pulse Rate: 79 Resp: 17 BP: 136/72 Patient Position (if appropriate): Lying Oxygen Therapy SpO2: 99 % O2 Device: Room Air Pain Pain Assessment Pain Score: 0-No pain Home Living/Prior Functioning Home Living Family/patient expects to be discharged to:: Private residence Living Arrangements: Alone Type of Home: House Home Access: Stairs to enter Technical brewer of Steps: 3(back entrace 3 steps with rail on L) Entrance Stairs-Rails: None Home Layout: One level Bathroom Shower/Tub: Chiropodist: Standard IADL History Homemaking Responsibilities: Yes Meal Prep  Responsibility: Primary Laundry Responsibility: Primary Cleaning Responsibility: Primary Bill Paying/Finance Responsibility: Primary Shopping Responsibility: Primary Child Care Responsibility: Primary Current License: Yes Mode of Transportation: Car Occupation: Retired 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 Leisure and Hobbies: antique cars Prior Function Level of  Independence: Independent with basic ADLs, Independent with gait, Independent with homemaking with ambulation  Able to Take Stairs?: Yes Driving: Yes ADL   Vision Baseline Vision/History: Wears glasses Wears Glasses: Reading only(can read clock on wall) Vision Assessment?: No apparent visual deficits Perception  Perception: Within Functional Limits Praxis Praxis: Intact Cognition Overall Cognitive Status: Within Functional Limits for tasks assessed Arousal/Alertness: Awake/alert Orientation Level: Person;Place;Situation Person: Oriented Place: Oriented Situation: Oriented Year: 2019 Month: April Day of Week: Correct Immediate Memory Recall: Sock;Blue;Bed Memory Recall: Blue Memory Recall Blue: Without Cue Attention: Sustained Sustained Attention: Appears intact Safety/Judgment: Appears intact Comments: (verbose) Sensation Sensation Light Touch: Impaired by gross assessment(decreased sensation in R toes) Coordination Gross Motor Movements are Fluid and Coordinated: Yes(BUE) Fine Motor Movements are Fluid and Coordinated: Yes(BUE) Motor  Motor Motor: Within Functional Limits Mobility  Transfers Transfers: Sit to Stand;Stand to Sit Sit to Stand: 5: Supervision Sit to Stand Details: Verbal cues for precautions/safety;Verbal cues for technique Stand to Sit: 5: Supervision Stand to Sit Details (indicate cue type and reason): Verbal cues for precautions/safety;Verbal cues for technique  Trunk/Postural Assessment  Cervical Assessment Cervical Assessment: Within Functional  Limits Thoracic Assessment Thoracic Assessment: Within Functional Limits Lumbar Assessment Lumbar Assessment: Within Functional Limits Postural Control Postural Control: Within Functional Limits  Balance Balance Balance Assessed: Yes Extremity/Trunk Assessment RUE Assessment RUE Assessment: Within Functional Limits LUE Assessment LUE Assessment: Exceptions to WFL(decreased thenar eminence)   See Function Navigator for Current Functional Status.   Refer to Care Plan for Long Term Goals  Recommendations for other services: None    Discharge Criteria: Patient will be discharged from OT if patient refuses treatment 3 consecutive times without medical reason, if treatment goals not met, if there is a change in medical status, if patient makes no progress towards goals or if patient is discharged from hospital.  The above assessment, treatment plan, treatment alternatives and goals were discussed and mutually agreed upon: by patient  Tonny Branch 01/21/2018, 8:47 AM

## 2018-01-21 NOTE — Evaluation (Signed)
Physical Therapy Assessment and Plan  Patient Details  Name: David Lane MRN: [id number] Date of Birth: 28-Apr-1946  PT Diagnosis: Abnormality of gait, Coordination disorder, Difficulty walking, Impaired sensation and Muscle weakness Rehab Potential: Good ELOS: 7-10 days   Today's Date: 01/21/2018 PT Individual Time: 1100-1155 AND 7048-8891 PT Individual Time Calculation (min): 55 min  AND 40 min  Problem List:  Patient Active Problem List   Diagnosis Date Noted  . Functional gait disorder 01/20/2018  . Acute blood loss anemia 01/20/2018  . Status post transmetatarsal amputation of left foot ([place]) 01/20/2018  . Cutaneous abscess of left foot   . Subacute osteomyelitis, left ankle and foot ([place])   . Sepsis due to cellulitis ([place]) 01/12/2018  . Foot ulcer due to secondary DM ([place]) 01/04/2018  . Status post coronary artery stent placement   . Acute ST elevation myocardial infarction (STEMI) involving left anterior descending (LAD) coronary artery ([place]) 12/20/2017  . 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 ([place]) 12/14/2015  . Wellness examination 11/25/2014  . Screening for prostate cancer 11/25/2014  . Pancreatic insufficiency 01/25/2013  . Ischemic cardiomyopathy, EF 50% by echo 08/01/13 07/19/2012  . Chronic combined systolic and diastolic CHF (congestive heart failure) ([place]) 07/14/2012  . Pulmonary edema, most likely due to diastolic dysfunction 70/45/0388  . CAD, 07/14/12- LAD/PDA DES   . ANXIETY 12/12/2009  . Hyperlipidemia 04/24/2007  . Essential hypertension 04/24/2007  . PANCREATITIS, HX OF 04/24/2007    Past Medical History:  Past Medical History:  Diagnosis Date  . CAD (coronary artery disease)    last cath by [facility] DR.  Mihai Croitoru showing  some  disease involving LCX and small size of Diag   . CHF exacerbation, due to diastolic dysfunction 70/80/0349  . Chronic systolic CHF (congestive heart failure), NYHA class 1 ([place]) 07/17/2012  . GERD  (gastroesophageal reflux disease)   . Hepatic lesion 02/04/11  . Hyperlipemia   . Hypertension   . Ischemic cardiomyopathy    EF 35-40%  . Liver hemangioma   . NSTEMI (non-ST elevated myocardial infarction) ([place]) 11/21/2009  . Pancreatitis 2000's  . Shortness of breath   . Type II diabetes mellitus ([place])    Past Surgical History:  Past Surgical History:  Procedure Laterality Date  . AMPUTATION Left 01/14/2018   Procedure: TRANSMETATARSAL AMPUTATION;  Surgeon: Newt Minion, MD;  Location: [place];  Service: Orthopedics;  Laterality: Left;  . CARDIAC CATHETERIZATION  2011   minimal disease, medical management  . CORONARY ANGIOPLASTY WITH STENT PLACEMENT  07/14/2012   successful PCI & stenting of mid LAD & PDA off the dominant CX  . CORONARY/GRAFT ACUTE MI REVASCULARIZATION N/A 12/20/2017   Procedure: Coronary/Graft Acute MI Revascularization;  Surgeon: Martinique, Peter M, MD;  Location: [place] CV LAB;  Service: Cardiovascular;  Laterality: N/A;  . INCISION AND DRAINAGE ABSCESS ANAL  1970's  . LEFT HEART CATH AND CORONARY ANGIOGRAPHY N/A 12/20/2017   Procedure: LEFT HEART CATH AND CORONARY ANGIOGRAPHY;  Surgeon: Martinique, Peter M, MD;  Location: [place] CV LAB;  Service: Cardiovascular;  Laterality: N/A;  . LEFT HEART CATHETERIZATION WITH CORONARY ANGIOGRAM N/A 07/14/2012   Procedure: LEFT HEART CATHETERIZATION WITH CORONARY ANGIOGRAM;  Surgeon: [name], MD;  Location: [place] CATH LAB;  Service: Cardiovascular;  Laterality: N/A;  . PERCUTANEOUS CORONARY STENT INTERVENTION (PCI-S) Right 07/14/2012   Procedure: PERCUTANEOUS CORONARY STENT INTERVENTION (PCI-S);  Surgeon: [name], MD;  Location: [facility] CATH LAB;  Service: Cardiovascular;  Laterality: Right;  Assessment & Plan Clinical Impression: Patient is a  72 year old male with history of coronary artery disease with ischemic cardiomyopathy and recent stent, hypertension, pancreatitis, type 1 diabetes mellitus, who was admitted  on 01/12/2018 with left drainage with abscess, cellulitis and osteomyelitis. He was started on IV antibiotics and underwent left transmetatarsal amputation by Dr. Sharol Given on 01/14/2018 and isto be nonweightbearing postop. He continues to have blood drainage from transmetatarsal site and VAC to stay in place for another week.Therapy ongoing and patient noted to have deficits in mobility and self-care tasks. CIRwas recommended for follow-up therapy. Patient transferred to CIR on 01/20/2018 .   Patient currently requires min with mobility secondary to muscle weakness, decreased cardiorespiratoy endurance and decreased standing balance, decreased postural control and decreased balance strategies.  Prior to hospitalization, patient was independent  with mobility and lived with Alone in a House home.  Home access is 3(Back entrance 3 steps w/ L rail, no rails at front 3 step)Stairs to enter.  Patient will benefit from skilled PT intervention to maximize safe functional mobility, minimize fall risk and decrease caregiver burden for planned discharge home with intermittent assist.  Anticipate patient will benefit from follow up [facility] at discharge.  PT - End of Session Activity Tolerance: Tolerates 30+ min activity with multiple rests Endurance Deficit: Yes Endurance Deficit Description: decreased  PT Assessment Rehab Potential (ACUTE/IP ONLY): Good PT Barriers to Discharge: Inaccessible home environment;Weight bearing restrictions PT Barriers to Discharge Comments: 3 steps to enter home w/ 1 rail PT Patient demonstrates impairments in the following area(s): Balance;Endurance;Safety;Sensory PT Transfers Functional Problem(s): Bed Mobility;Bed to Chair;Car;Furniture;Floor PT Locomotion Functional Problem(s): Ambulation;Stairs;Wheelchair Mobility PT Plan PT Intensity: Minimum of 1-2 x/day ,45 to 90 minutes PT Frequency: 5 out of 7 days PT Duration Estimated Length of Stay: 7-10 days PT  Treatment/Interventions: Visual/perceptual remediation/compensation;Stair training;Pain management;Disease management/prevention;Ambulation/gait training;Balance/vestibular training;DME/adaptive equipment instruction;Patient/family education;Therapeutic Activities;Wheelchair propulsion/positioning;Cognitive remediation/compensation;Psychosocial support;Therapeutic Exercise;Community reintegration;Functional mobility training;Skin care/wound management;UE/LE Strength taining/ROM;Neuromuscular re-education;Splinting/orthotics;Discharge planning;UE/LE Coordination activities PT Transfers Anticipated Outcome(s): Mod I PT Locomotion Anticipated Outcome(s): Mod I household gait PT Recommendation Follow Up Recommendations: Home health PT Patient destination: Home Equipment Recommended: To be determined  Skilled Therapeutic Intervention  Session 1:  Pt in supine and agreeable to therapy, denies pain. Pt instructed patient in PT Evaluation and initiated treatment intervention; see below for results. Pt educated patient in [place], rehab potential, rehab goals, and discharge recommendations. Occasional rest breaks 2/2 fatigue that pt states is because of his recent cardiac stent procedure in March. Educated pt on safety precautions per unit protocol. Pt verbalized understanding and in agreement. Ended session in w/c, call bell within reach and all needs met.   Session 2:  Pt in recliner and agreeable to therapy, denies pain. Worked on global strengthening this session. Pt self-propelled w/c to/from therapy gym to work on endurance. Performed BUE and BLE strengthening exercises as listed below. During rest breaks and exercises, educated pt on typical progression of mobility w/ deconditioning impairments in light of recent midfoot amputation. Pt appreciative of education. Returned to room and ended session in supine, call bell within reach and all needs met.   Strengthening exercises: -LAQs 2x10 -knee marches  2x10 -arm flexion w/ 3# dowel rod 2x10 -shoulder press w/ 3# dowel rod 2x10 -resisted heel slides 2x10 w/ orange theraband  PT Evaluation Precautions/Restrictions Precautions Precautions: Fall Precaution Comments: Wound vac, L midfoot amputation Restrictions Weight Bearing Restrictions: Yes LUE Weight Bearing: Non weight bearing LLE Weight Bearing: Non weight bearing Pain Pain Assessment  Pain Scale: 0-10 Pain Score: 0-No pain Home Living/Prior Functioning Home Living Available Help at Discharge: (girlfriend and sister able to assist PRN) Type of Home: House Home Access: Stairs to enter [employer] of Steps: 3(Back entrance 3 steps w/ L rail, no rails at front 3 step) Entrance Stairs-Rails: Left Home Layout: One level Bathroom Shower/Tub: Chiropodist: Standard  Lives With: Alone Prior Function Level of Independence: Independent with basic ADLs;Independent with gait;Independent with homemaking with ambulation;Independent with transfers  Able to Take Stairs?: Yes Driving: Yes Vocation: Retired Leisure: Hobbies-yes (Comment) Comments: enjoys working on cars and doing Presenter, broadcasting: Within Advertising copywriter Praxis Praxis: Intact  Cognition Overall Cognitive Status: Within Functional Limits for tasks assessed Arousal/Alertness: Awake/alert Orientation Level: Oriented X4 Safety/Judgment: Appears intact Sensation Sensation Light Touch: Impaired by gross assessment(no sensation in L foot at amputation site, otherwise intact, reports intact sensation of RLE w/ occasional tingling in R foot) Coordination Gross Motor Movements are Fluid and Coordinated: No Fine Motor Movements are Fluid and Coordinated: Yes Coordination and Movement Description: Coordination impaired 2/2 to NWB status of LLE Motor  Motor Motor: Within Functional Limits  Mobility Bed Mobility Bed Mobility: Rolling Right;Rolling Left;Supine  to Sit;Sit to Supine Rolling Right: 5: Supervision Rolling Left: 5: Supervision Supine to Sit: 5: Supervision Sit to Supine: 5: Supervision Transfers Transfers: Yes Sit to Stand: 5: Supervision Sit to Stand Details: Verbal cues for safe use of DME/AE Stand to Sit: 5: Supervision Stand to Sit Details (indicate cue type and reason): Verbal cues for safe use of DME/AE Stand Pivot Transfers: 4: Min guard Locomotion  Ambulation Ambulation: Yes Ambulation/Gait Assistance: 4: Min guard Ambulation Distance (Feet): 50 Feet Assistive device: Rolling walker Gait Gait: Yes Gait velocity: decreased Stairs / Additional Locomotion Stairs: Yes Stairs Assistance: 4: Min assist Stairs Assistance Details: Verbal cues for technique;Manual facilitation for placement;Manual facilitation for weight shifting;Verbal cues for sequencing Stair Management Technique: Two rails Number of Stairs: 2 Height of Stairs: 6 Wheelchair Mobility Wheelchair Mobility: Yes Wheelchair Assistance: 5: Investment banker, operational Details: Verbal cues for Marketing executive: Both upper extremities Wheelchair Parts Management: Supervision/cueing Distance: 150'  Trunk/Postural Assessment  Cervical Assessment Cervical Assessment: Within Functional Limits Thoracic Assessment Thoracic Assessment: Within Functional Limits Lumbar Assessment Lumbar Assessment: Within Functional Limits Postural Control Postural Control: Within Functional Limits  Balance Balance Balance Assessed: Yes Static Sitting Balance Static Sitting - Balance Support: No upper extremity supported;Feet supported Static Sitting - Level of Assistance: 5: Stand by assistance Dynamic Sitting Balance Dynamic Sitting - Balance Support: No upper extremity supported;Feet supported Dynamic Sitting - Level of Assistance: 5: Stand by assistance Static Standing Balance Static Standing - Balance Support: Bilateral upper extremity  supported;During functional activity Static Standing - Level of Assistance: 5: Stand by assistance Dynamic Standing Balance Dynamic Standing - Balance Support: Right upper extremity supported;During functional activity Dynamic Standing - Level of Assistance: 4: Min assist Extremity Assessment  RUE Assessment RUE Assessment: Within Functional Limits LUE Assessment LUE Assessment: Exceptions to WFL(decreased thenar eminence) RLE Assessment RLE Assessment: Within Functional Limits LLE Assessment LLE Assessment: Exceptions to WFL(WFL except ankle PF/DF 2/2 recent midfoot amputation)   See Function Navigator for Current Functional Status.   Refer to Care Plan for Long Term Goals  Recommendations for other services: None   Discharge Criteria: Patient will be discharged from PT if patient refuses treatment 3 consecutive times without medical reason, if treatment goals not met, if there is a change in medical status,  if patient makes no progress towards goals or if patient is discharged from hospital.  The above assessment, treatment plan, treatment alternatives and goals were discussed and mutually agreed upon: by patient  Sabre Leonetti K Arnette 01/21/2018, 12:21 PM

## 2018-01-21 NOTE — Progress Notes (Signed)
Initial Nutrition Assessment  DOCUMENTATION CODES:   Not applicable  INTERVENTION:  Continue 30 ml Prostat po BID, each supplement provides 100 kcal and 15 grams of protein.   Continue Premier Protein po BID, each supplement provides 160 kcal and 30 grams of protein.   Diet handout "Heart Healthy Consistent Carbohydrate Nutrition Therapy" given.   NUTRITION DIAGNOSIS:   Increased nutrient needs related to chronic illness, wound healing as evidenced by estimated needs.  GOAL:   Patient will meet greater than or equal to 90% of their needs  MONITOR:   PO intake, Supplement acceptance, Labs, Weight trends, I & O's, Skin  REASON FOR ASSESSMENT:   Consult Diet education  ASSESSMENT:   72 year old male with history of coronary artery disease with ischemic cardiomyopathy and recent stent, hypertension, pancreatitis, type 1 diabetes mellitus, who was admitted on 01/12/2018 with left drainage with abscess, cellulitis and osteomyelitis.  He was started on IV antibiotics and underwent left transmetatarsal amputation by Dr. Sharol Given on 01/14/2018.   Meal completion has been 100%. Pt reports having a good appetite with no other difficulties. Pt additionally reports he was eating well PTA with usual consumption of at least 3 meals a day.  Pt currently has Prostat and Premier protein ordered for wound healing and has been consuming them. RD to continue with current orders. RD additionally consulted for diet education. Handout "Heart Healthy Consistent Carbohydrate Nutrition Therapy" from the Academy of [employer] Dietetics Manual given. Pt was getting ready to eat lunch at time of visit and reports he will read though the handout in his own time. Pt reports he is motivated to eat healthier and control his diabetes.   Unable to complete Nutrition-Focused physical exam at this time. RD to perform physical exam at next visit.   Labs and medications reviewed.   Diet Order:  Diet heart healthy/carb  modified Room service appropriate? Yes; Fluid consistency: Thin  EDUCATION NEEDS:   Education needs have been addressed  Skin:  Skin Assessment: Skin Integrity Issues: Skin Integrity Issues:: Incisions, Wound VAC Wound Vac: L leg Incisions: L leg  Last BM:  4/26  Height:   Ht Readings from Last 1 Encounters:  01/20/18 6' (1.829 m)    Weight:   Wt Readings from Last 1 Encounters:  01/21/18 189 lb 2.5 oz (85.8 kg)    Ideal Body Weight:  80.9 kg  BMI:  Body mass index is 25.65 kg/m.  Estimated Nutritional Needs:   Kcal:  2000-2200  Protein:  105-120 grams  Fluid:  2-2.2 L/day    Corrin Parker, MS, RD, LDN Pager # [phone] After hours/ weekend pager # [phone]

## 2018-01-21 NOTE — Progress Notes (Signed)
[facility] [place] PHYSICAL MEDICINE & REHABILITATION     PROGRESS NOTE    Subjective/Complaints: Patient states that he slept well.  Has minimal pain.  Anxious to get started with therapies today.  ROS: Patient denies fever, rash, sore throat, blurred vision, nausea, vomiting, diarrhea, cough, shortness of breath or chest pain, joint or back pain, headache, or mood change.   Objective: Vital Signs: Blood pressure 136/72, pulse 79, temperature 97.9 F (36.6 C), temperature source Oral, resp. rate 17, height 6' (1.829 m), weight 85.8 kg (189 lb 2.5 oz), SpO2 99 %. No results found. Recent Labs    01/21/18 0610  WBC 7.9  HGB 9.8*  HCT 31.0*  PLT 480*   Recent Labs    01/20/18 0713 01/21/18 0610  NA 137 137  K 4.0 3.9  CL 105 102  GLUCOSE 189* 114*  BUN 19 20  CREATININE 1.28* 1.20  CALCIUM 8.2* 8.3*   CBG (last 3)  Recent Labs    01/20/18 1156 01/20/18 2136 01/21/18 0653  GLUCAP 139* 128* 109*    Wt Readings from Last 3 Encounters:  01/21/18 85.8 kg (189 lb 2.5 oz)  01/13/18 88.9 kg (196 lb)  01/04/18 88.9 kg (196 lb)    Physical Exam:  Constitutional: No distress . Vital signs reviewed. HEENT: EOMI, oral membranes moist Neck: supple Cardiovascular: RRR without murmur. No JVD    Respiratory: CTA Bilaterally without wheezes or rales. Normal effort    GI: BS +, non-tender, non-distended  Musculoskeletal: Left transmetartasal amputation site with prevana VAC in place with mild amount of bloody drainage. Hard callus under right great toe. Left foot appropriately tender/intact. No edema Neurological: He isalertand oriented to person, place, and time. Nocranial nerve deficit.   Insight and awareness UE motor 5/5. LLE limited by pain but 3+ to 4/5. RLE 4/5. Decreased LT distally RLE Skin: Skin iswarm.  Psychiatric: Very pleasant but can be verbose    Assessment/Plan: 1.  Functional mobility deficits secondary to left transmetatarsal amputation which require  3+ hours per day of interdisciplinary therapy in a comprehensive inpatient rehab setting. Physiatrist is providing close team supervision and 24 hour management of active medical problems listed below. Physiatrist and rehab team continue to assess barriers to discharge/monitor patient progress toward functional and medical goals.  Function:  Bathing Bathing position      Bathing parts      Bathing assist        Upper Body Dressing/Undressing Upper body dressing                    Upper body assist        Lower Body Dressing/Undressing Lower body dressing                                  Lower body assist        Toileting Toileting   Toileting steps completed by patient: Adjust clothing prior to toileting, Performs perineal hygiene, Adjust clothing after toileting   Toileting Assistive Devices: Grab bar or rail  Toileting assist Assist level: Touching or steadying assistance (Pt.75%)   Transfers Chair/bed Clinical biochemist          Cognition Comprehension Comprehension assist level: Follows basic conversation/direction with no assist  Expression Expression assist level: Expresses basic  needs/ideas: With no assist  Social Interaction Social Interaction assist level: Interacts appropriately 90% of the time - Needs monitoring or encouragement for participation or interaction.  Problem Solving Problem solving assist level: Solves basic problems with no assist  Memory Memory assist level: Complete Independence: No helper   Medical Problem List and Plan: 1.Functional deficitssecondary to left transmetatarsal amputation -Beginning therapies today.  Patient appears motivated 2. DVT Prophylaxis/Anticoagulation: Pharmaceutical:Lovenox 3. Pain Management:  4. Mood:LCSW to follow for evaluation and support 5. Neuropsych: This patientiscapable of making decisions on hisown  behalf. 6. Skin/Wound Care:Continue Prevena VAC. Follow up with surgical team re: duration of use 7. Fluids/Electrolytes/Nutrition: .  Encourage p.o. intake.  Have added protein supplement.   -I have reviewed all lab work today personally 8. T1DMpoorly controlled: Monitor BS ac/hs. Continue Lantus bid with meal coverage for better control.    -Patient appears to have awareness that he has to take better control of his blood sugars.  Dietary and diabetes education will be provided.  -Reasonable glucose control at present.   9. HTN: Monitor BP bid. Continue coreg bid 10 Chronic combined systolic diastolic CHF: Heart healthy diet--compensated on coreg, Crestor and ASA. Off spironolactone and lisinopril at this time due to issue with hypotension. Monitor weight daily.  11. CAD s/p recent stent: On Brilinta, ASA and Crestor.  12 ABLA: Hemoglobin 9.8 today.  Continue to follow 13. AKI: Due to vancomycin. Encourage fluid intake. Continue to monitor closely for recovery.   LOS (Days) [place] EVALUATION WAS PERFORMED  Meredith Staggers, MD 01/21/2018 9:34 AM

## 2018-01-22 ENCOUNTER — Inpatient Hospital Stay (HOSPITAL_COMMUNITY): Payer: Medicare Other

## 2018-01-22 LAB — GLUCOSE, CAPILLARY
GLUCOSE-CAPILLARY: 130 mg/dL — AB (ref 65–99)
GLUCOSE-CAPILLARY: 122 mg/dL — AB (ref 65–99)
GLUCOSE-CAPILLARY: 228 mg/dL — AB (ref 65–99)
Glucose-Capillary: 127 mg/dL — ABNORMAL HIGH (ref 65–99)

## 2018-01-22 MED ORDER — INSULIN GLARGINE 100 UNIT/ML ~~LOC~~ SOLN
55.0000 [IU] | Freq: Every day | SUBCUTANEOUS | Status: DC
  Administered 2018-01-23 – 2018-01-28 (×6): 55 [IU] via SUBCUTANEOUS
  Filled 2018-01-22 (×7): qty 0.55

## 2018-01-22 MED ORDER — LIVING BETTER WITH HEART FAILURE BOOK
Freq: Once | Status: DC
  Filled 2018-01-22: qty 1

## 2018-01-22 MED ORDER — LIVING WELL WITH DIABETES BOOK
Freq: Once | Status: AC
  Administered 2018-01-22: 18:00:00
  Filled 2018-01-22 (×2): qty 1

## 2018-01-22 NOTE — Progress Notes (Signed)
[place] PHYSICAL MEDICINE & REHABILITATION     PROGRESS NOTE    Subjective/Complaints: Patient had a good day with therapy.  Stated he did not realize how weak he was until he is more active  ROS: Patient denies fever, rash, sore throat, blurred vision, nausea, vomiting, diarrhea, cough, shortness of breath or chest pain, joint or back pain, headache, or mood change. .   Objective: Vital Signs: Blood pressure 120/62, pulse 72, temperature 99 F (37.2 C), temperature source Oral, resp. rate 16, height 6' (1.829 m), weight 86 kg (189 lb 9.5 oz), SpO2 98 %. No results found. Recent Labs    01/21/18 0610  WBC 7.9  HGB 9.8*  HCT 31.0*  PLT 480*   Recent Labs    01/20/18 0713 01/21/18 0610  NA 137 137  K 4.0 3.9  CL 105 102  GLUCOSE 189* 114*  BUN 19 20  CREATININE 1.28* 1.20  CALCIUM 8.2* 8.3*   CBG (last 3)  Recent Labs    01/21/18 1653 01/21/18 2110 01/22/18 0626  GLUCAP 220* 142* 127*    Wt Readings from Last 3 Encounters:  01/22/18 86 kg (189 lb 9.5 oz)  01/13/18 88.9 kg (196 lb)  01/04/18 88.9 kg (196 lb)    Physical Exam:  Constitutional: No distress . Vital signs reviewed. HEENT: EOMI, oral membranes moist Neck: supple Cardiovascular: RRR without murmur. No JVD    Respiratory: CTA Bilaterally without wheezes or rales. Normal effort    GI: BS +, non-tender, non-distended  Musculoskeletal:  Left transmetatarsal amputation site with VAC in place.  Hard callus under right great toe. Left foot appropriately tender/intact. No edema Neurological: He isalertand oriented to person, place, and time. Nocranial nerve deficit.   Insight and awareness UE motor 5/5. LLE limited by pain but 3+ to 4/5. RLE 4/5. Decreased LT distally RLE Skin: Skin iswarm.  Psychiatric: Pleasant    Assessment/Plan: 1.  Functional mobility deficits secondary to left transmetatarsal amputation which require 3+ hours per day of interdisciplinary therapy in a comprehensive  inpatient rehab setting. Physiatrist is providing close team supervision and 24 hour management of active medical problems listed below. Physiatrist and rehab team continue to assess barriers to discharge/monitor patient progress toward functional and medical goals.  Function:  Bathing Bathing position   Position: Wheelchair/chair at sink  Bathing parts Body parts bathed by patient: Right arm, Left arm, Chest, Abdomen, Left lower leg, Front perineal area, Buttocks, Right upper leg, Left upper leg, Right lower leg Body parts bathed by helper: Back  Bathing assist Assist Level: Supervision or verbal cues      Upper Body Dressing/Undressing Upper body dressing   What is the patient wearing?: Button up shirt         Button up shirt - Perfomed by patient: Thread/unthread right sleeve, Thread/unthread left sleeve, Pull shirt around back, Button/unbutton shirt      Upper body assist Assist Level: Supervision or verbal cues      Lower Body Dressing/Undressing Lower body dressing   What is the patient wearing?: Pants     Pants- Performed by patient: Thread/unthread right pants leg, Thread/unthread left pants leg, Pull pants up/down, Fasten/unfasten pants                        Lower body assist Assist for lower body dressing: Supervision or verbal cues      Toileting Toileting   Toileting steps completed by patient: Adjust clothing prior to toileting,  Performs perineal hygiene, Adjust clothing after toileting   Toileting Assistive Devices: Grab bar or rail  Toileting assist Assist level: Touching or steadying assistance (Pt.75%)   Transfers Chair/bed transfer   Chair/bed transfer method: Stand pivot Chair/bed transfer assist level: Touching or steadying assistance (Pt > 75%) Chair/bed transfer assistive device: Medical sales representative     Max distance: 40' Assist level: Touching or steadying assistance (Pt > 75%)   Wheelchair   Type: Manual Max  wheelchair distance: 150' Assist Level: Supervision or verbal cues  Cognition Comprehension Comprehension assist level: Follows basic conversation/direction with no assist  Expression Expression assist level: Expresses complex ideas: With no assist  Social Interaction Social Interaction assist level: Interacts appropriately with others - No medications needed.  Problem Solving Problem solving assist level: Solves complex problems: Recognizes & self-corrects  Memory Memory assist level: Complete Independence: No helper   Medical Problem List and Plan: 1.Functional deficitssecondary to left transmetatarsal amputation -Continue PT and OT 2. DVT Prophylaxis/Anticoagulation: Pharmaceutical:Lovenox 3. Pain Management:  4. Mood:LCSW to follow for evaluation and support 5. Neuropsych: This patientiscapable of making decisions on hisown behalf. 6. Skin/Wound Care:Continue Prevena VAC. Follow up with surgical team re: duration of use on Monday 7. Fluids/Electrolytes/Nutrition: .  Encourage p.o. intake.  Have added protein supplement.  8. T1DMpoorly controlled: Monitor BS ac/hs. Continue Lantus bid with meal coverage for better control.    -Patient appears to have awareness that he has to take better control of his blood sugars.  Dietary and diabetes education will be provided.  -Glucose control still suboptimal.  Increase morning Lantus to 55 units in maintain evening Lantus at 5 units 9. HTN: Monitor BP bid. Continue coreg bid 10 Chronic combined systolic diastolic CHF: Heart healthy diet--compensated on coreg, Crestor and ASA. Off spironolactone and lisinopril at this time due to issue with hypotension.    Filed Weights   01/20/18 1829 01/21/18 0531 01/22/18 0454  Weight: 85.6 kg (188 lb 11.4 oz) 85.8 kg (189 lb 2.5 oz) 86 kg (189 lb 9.5 oz)   11. CAD s/p recent stent: On Brilinta, ASA and Crestor.  12 ABLA: Hemoglobin 9.8 .  Continue to follow 13. AKI: Due to  vancomycin. Encourage fluid intake. Continue to monitor closely for recovery.   LOS (Days) [place] EVALUATION WAS PERFORMED  Meredith Staggers, MD 01/22/2018 10:15 AM

## 2018-01-22 NOTE — Progress Notes (Signed)
Occupational Therapy Session Note  Patient Details  Name: GEVORG BRUM MRN: [id number] Date of Birth: Aug 21, 1946  Today's Date: 01/22/2018 OT Individual Time: 1855-0158 OT Individual Time Calculation (min): 42 min    Short Term Goals: Week 1:  OT Short Term Goal 1 (Week 1): n/a d/t ELOS  Skilled Therapeutic Interventions/Progress Updates:    1:1. Pt completes stand pivot transfer with supervision and VC for hand placemetn. OT educates on applying footrests for transfers. Pt able to completes with min instructional cues for lining up pin. Pt propels w/c to/from all tx destinations with supervision. Pt completes standing activities with CGA for dynamic reaching to obtain horse shoes with 1 UE support with RW. Pt completes 3x30 passes of basketball (chest bounce and overhead) with 1# weights for BUE strengthening, coordination, and endurance required for BADLs. Exited session with pt seated in bed, call light in reach and all needs met.  Therapy Documentation Precautions:  Precautions Precautions: Fall Precaution Comments: Wound vac, L midfoot amputation Restrictions Weight Bearing Restrictions: Yes LUE Weight Bearing: Non weight bearing LLE Weight Bearing: Non weight bearing General:   Vital Signs: Therapy Vitals Temp: 98.1 F (36.7 C) Temp Source: Oral Pulse Rate: 88 Resp: 18 BP: 108/70 Patient Position (if appropriate): Lying Oxygen Therapy SpO2: 100 % O2 Device: Room Air  See Function Navigator for Current Functional Status.   Therapy/Group: Individual Therapy  Tonny Branch 01/22/2018, 3:47 PM

## 2018-01-23 ENCOUNTER — Inpatient Hospital Stay (HOSPITAL_COMMUNITY): Payer: Medicare Other

## 2018-01-23 ENCOUNTER — Inpatient Hospital Stay (HOSPITAL_COMMUNITY): Payer: Medicare Other | Admitting: Occupational Therapy

## 2018-01-23 DIAGNOSIS — E1051 Type 1 diabetes mellitus with diabetic peripheral angiopathy without gangrene: Secondary | ICD-10-CM

## 2018-01-23 DIAGNOSIS — I10 Essential (primary) hypertension: Secondary | ICD-10-CM

## 2018-01-23 LAB — GLUCOSE, CAPILLARY
GLUCOSE-CAPILLARY: 83 mg/dL (ref 65–99)
GLUCOSE-CAPILLARY: 228 mg/dL — AB (ref 65–99)
GLUCOSE-CAPILLARY: 126 mg/dL — AB (ref 65–99)
Glucose-Capillary: 168 mg/dL — ABNORMAL HIGH (ref 65–99)

## 2018-01-23 NOTE — Progress Notes (Signed)
David Diones, RN  Rehab Admission Coordinator  Physical Medicine and Rehabilitation  PMR Pre-admission  Signed  Date of Service:  01/20/2018 12:27 PM       Related encounter: ED to Hosp-Admission (Discharged) from 01/12/2018 in [place]            Show:Clear all _0 Manual_1 Template_2 Copied  Added by: _3 David Diones, RN   _4 Hover for details   PMR Admission Coordinator Pre-Admission Assessment  Patient: David Lane is an 72 y.o., male MRN: [id number] DOB: [birth year] Height: 6' (182.9 cm) Weight: 88.9 kg (196 lb)                                                                                                                                      Insurance Information HMO: Yes   PPO:       PCP:       IPA:       80/20:       OTHER:  Group # [id number] PRIMARY: BCBS Medicare      Policy#: [id number]      Subscriber:  patient CM Name: Limmie Patricia      Phone#: [phone]     Fax#: [phone] Pre-Cert#:                                Employer:  Retired Benefits:  Phone #:  [phone]     Name:  On line Eff. Date:  09/27/17     Deduct:  $0      Out of Pocket Max: $5500 (met $1603.40)      Life Max: N/A CIR: $310 days 1-6      SNF: $0 days 1-20; $172 days 21-60; $0 days 61-100 Outpatient: medical necessity     Co-Pay: $40/visit Home Health: 100%      Co-Pay: none DME: 80%     Co-Pay: 20% Providers: in network  Emergency Contact Information         Contact Information    Name Relation Home Work Mobile   Rose,Anna Sister Sayre other   [phone]     Current Medical History  Patient Admitting Diagnosis: L TMA  History of Present Illness:  A 72 y.o.malewith history of CAD with ICM--recent stent, HTN, pancreatitis, T1DM who was admitted on 01/12/18 with left foot drainage with abscess, cellulitis and osteomyelitis. He was started on IV  antibiotics and underwent transmetatarsal amputation left foot by Dr. Sharol Given on 04/20. To be NWB with recommendations to continue antibiotics for 3 days post op. PT evaluation done and CIR recommended due to functional deficits.   Past Medical History      Past Medical History:  Diagnosis Date  . CAD (coronary artery disease)    last cath by [facility] DR.  Mihai Croitoru showing  some  disease involving LCX and small size of Diag   . CHF exacerbation, due to diastolic dysfunction 50/05/3817  . Chronic systolic CHF (congestive heart failure), NYHA class 1 ([place]) 07/17/2012  . GERD (gastroesophageal reflux disease)   . Hepatic lesion 02/04/11  . Hyperlipemia   . Hypertension   . Ischemic cardiomyopathy    EF 35-40%  . Liver hemangioma   . NSTEMI (non-ST elevated myocardial infarction) ([place]) 11/21/2009  . Pancreatitis 2000's  . Shortness of breath   . Type II diabetes mellitus (HCC)     Family History  family history includes Diabetes in his maternal grandfather, mother, and paternal grandmother.  Prior Rehab/Hospitalizations: No previous rehab  Has the patient had major surgery during 100 days prior to admission? Yes.  Had a stent replaced in his heart about 1 week before admission to acute care hospital.  Current Medications   Current Facility-Administered Medications:  .  0.9 %  sodium chloride infusion, , Intravenous, Continuous, Newt Minion, MD, Last Rate: 10 mL/hr at 01/16/18 0004 .  acetaminophen (TYLENOL) tablet 325-650 mg, 325-650 mg, Oral, Q6H PRN, Newt Minion, MD, 650 mg at 01/14/18 1650 .  aspirin chewable tablet 81 mg, 81 mg, Oral, Daily, Newt Minion, MD, 81 mg at 01/20/18 0957 .  bisacodyl (DULCOLAX) suppository 10 mg, 10 mg, Rectal, Daily PRN, Newt Minion, MD .  carvedilol (COREG) tablet 3.125 mg, 3.125 mg, Oral, BID WC, Newt Minion, MD, 3.125 mg at 01/20/18 0819 .  docusate sodium (COLACE) capsule 100 mg, 100 mg, Oral, BID, Newt Minion,  MD, 100 mg at 01/18/18 0807 .  feeding supplement (PRO-STAT SUGAR FREE 64) liquid 30 mL, 30 mL, Oral, BID, Newt Minion, MD, 30 mL at 01/20/18 0957 .  HYDROcodone-acetaminophen (NORCO) 7.5-325 MG per tablet 1-2 tablet, 1-2 tablet, Oral, Q4H PRN, Newt Minion, MD .  HYDROcodone-acetaminophen (NORCO/VICODIN) 5-325 MG per tablet 1-2 tablet, 1-2 tablet, Oral, Q4H PRN, Newt Minion, MD, 1 tablet at 01/18/18 1023 .  insulin aspart (novoLOG) injection 0-5 Units, 0-5 Units, Subcutaneous, QHS, Newt Minion, MD, 5 Units at 01/18/18 0126 .  insulin aspart (novoLOG) injection 0-9 Units, 0-9 Units, Subcutaneous, TID WC, Newt Minion, MD, 1 Units at 01/20/18 1235 .  insulin aspart (novoLOG) injection 7 Units, 7 Units, Subcutaneous, TID WC, Robbie Lis, MD, 7 Units at 01/20/18 1235 .  insulin glargine (LANTUS) injection 5 Units, 5 Units, Subcutaneous, QHS, Robbie Lis, MD .  insulin glargine (LANTUS) injection 50 Units, 50 Units, Subcutaneous, Daily, Robbie Lis, MD, 50 Units at 01/20/18 [phone] .  lactated ringers infusion, , Intravenous, Continuous, Newt Minion, MD, Last Rate: 75 mL/hr at 01/14/18 1656 .  magnesium citrate solution 1 Bottle, 1 Bottle, Oral, Once PRN, Newt Minion, MD .  methocarbamol (ROBAXIN) tablet 500 mg, 500 mg, Oral, Q6H PRN **OR** methocarbamol (ROBAXIN) 500 mg in dextrose 5 % 50 mL IVPB, 500 mg, Intravenous, Q6H PRN, Newt Minion, MD .  metoCLOPramide (REGLAN) tablet 5-10 mg, 5-10 mg, Oral, Q8H PRN **OR** metoCLOPramide (REGLAN) injection 5-10 mg, 5-10 mg, Intravenous, Q8H PRN, Newt Minion, MD .  morphine 2 MG/ML injection 0.5-1 mg, 0.5-1 mg, Intravenous, Q2H PRN, Newt Minion, MD .  ondansetron (ZOFRAN) tablet 4 mg, 4 mg, Oral, Q6H PRN **OR** ondansetron (ZOFRAN) injection 4 mg, 4 mg, Intravenous, Q6H PRN, Newt Minion, MD .  pantoprazole (PROTONIX) EC tablet 40 mg,  40 mg, Oral, Daily, Newt Minion, MD, 40 mg at 01/20/18 0957 .  polyethylene glycol (MIRALAX  / GLYCOLAX) packet 17 g, 17 g, Oral, Daily PRN, Newt Minion, MD .  polyvinyl alcohol (LIQUIFILM TEARS) 1.4 % ophthalmic solution 1 drop, 1 drop, Both Eyes, PRN, Newt Minion, MD, 1 drop at 01/15/18 0912 .  rosuvastatin (CRESTOR) tablet 40 mg, 40 mg, Oral, QHS, Newt Minion, MD, 40 mg at 01/19/18 2210 .  ticagrelor (BRILINTA) tablet 90 mg, 90 mg, Oral, BID, Newt Minion, MD, 90 mg at 01/20/18 8032  Patients Current Diet: Diet Carb Modified Fluid consistency: Thin; Room service appropriate? Yes Diet - low sodium heart healthy  Precautions / Restrictions Precautions Precautions: Fall Precaution Comments: pt with would vac present and amputation of midfoot on L Restrictions Weight Bearing Restrictions: Yes LUE Weight Bearing: Non weight bearing   Has the patient had 2 or more falls or a fall with injury in the past year?No.  Has 1 fall on day of admission with bump on his head.  Prior Activity Level Community (5-7x/wk): Went out daily, was driving.  Home Assistive Devices / Equipment Home Assistive Devices/Equipment: CBG Meter Home Equipment: None  Prior Device Use: Indicate devices/aids used by the patient prior to current illness, exacerbation or injury? None  Prior Functional Level Prior Function Level of Independence: Independent  Self Care: Did the patient need help bathing, dressing, using the toilet or eating?  Independent  Indoor Mobility: Did the patient need assistance with walking from room to room (with or without device)? Independent  Stairs: Did the patient need assistance with internal or external stairs (with or without device)? Independent  Functional Cognition: Did the patient need help planning regular tasks such as shopping or remembering to take medications? Independent  Current Functional Level Cognition  Overall Cognitive Status: Within Functional Limits for tasks assessed Orientation Level: Oriented X4    Extremity  Assessment (includes Sensation/Coordination)  Upper Extremity Assessment: Overall WFL for tasks assessed  Lower Extremity Assessment: Defer to PT evaluation    ADLs  Overall ADL's : Needs assistance/impaired Eating/Feeding: Independent Grooming: Wash/dry hands, Wash/dry face, Oral care, Supervision/safety, Sitting Grooming Details (indicate cue type and reason): pt requires (A) to safely reach sink levle and sitting to complete grooming at the sink. cues for safety during task  Upper Body Bathing: Supervision/ safety, Sitting Upper Body Bathing Details (indicate cue type and reason): sink level  Lower Body Bathing: Minimal assistance, Sit to/from stand Upper Body Dressing : Supervision/safety Lower Body Dressing: Supervision/safety, Set up Lower Body Dressing Details (indicate cue type and reason): min guard A sit<>stand Toilet Transfer: Minimal assistance, Ambulation, RW, BSC Toilet Transfer Details (indicate cue type and reason): over toilet Functional mobility during ADLs: Minimal assistance, Rolling walker General ADL Comments: pt complete sink level groomign and bathing task. pt declined shaving after specifically requesting due to fatigue with adl task. pt states "i can't believe just this bit of doing has worn me out"    Mobility  Overal bed mobility: Modified Independent General bed mobility comments: pt up in recliner upon my entry    Transfers  Overall transfer level: Needs assistance Equipment used: Rolling walker (2 wheeled) Transfers: Sit to/from Stand Sit to Stand: Min guard General transfer comment: line mgt and cues to ensure wound vac is in safe position before standing/ambulating. Able to rise with min guard and no LOB this visit.     Ambulation / Gait / Stairs / Emergency planning/management officer  Ambulation/Gait Ambulation/Gait assistance: Min guard, Supervision Ambulation Distance (Feet): 50 Feet Assistive device: Rolling walker (2 wheeled) Gait Pattern/deviations:  Step-to pattern General Gait Details: Patient with hop gait, cont good adherence to NWB status. chair follow as patient fatigues after 30'  Gait velocity: decreased    Posture / Balance Balance Overall balance assessment: Needs assistance Sitting-balance support: No upper extremity supported, Feet supported Sitting balance-Leahy Scale: Good Standing balance support: Single extremity supported Standing balance-Leahy Scale: Poor Standing balance comment: dependent on RW or hand on sink for stability    Special needs/care consideration BiPAP/CPAP No CPM No Continuous Drip IV KVO Dialysis No      Life Vest No Oxygen No Special Bed No Trach Size No Wound Vac (area) Yes to Left TMA site     Skin Has a VAC dressing to left TMA site                            Bowel mgmt: Last BM 01/19/18 Bladder mgmt: Voiding in urinal Diabetic mgmt Yes, on insulin at home    Previous Home Environment Living Arrangements: Alone Type of Home: House Home Layout: One level Home Access: Stairs to enter Entrance Stairs-Rails: None Entrance Stairs-Number of Steps: 3 Home Care Services: No  Discharge Living Setting Plans for Discharge Living Setting: Patient's home, Alone, House(Lives alone.) Type of Home at Discharge: House Discharge Home Layout: One level Discharge Home Access: Stairs to enter Entrance Stairs-Number of Steps: 2 steps Does the patient have any problems obtaining your medications?: No  Social/Family/Support Systems Patient Roles: Other (Comment)(Has a sister and a significant other.) Contact Information: Vertell Limber - sister - [phone] Anticipated Caregiver: self, sister and significant other Ability/Limitations of Caregiver: Sister has been helping with bill paying.  Sister and girlfriend could help prn. Caregiver Availability: Intermittent Discharge Plan Discussed with Primary Caregiver: Yes Is Caregiver In Agreement with Plan?: Yes Does Caregiver/Family have Issues with  Lodging/Transportation while Pt is in Rehab?: No  Goals/Additional Needs Patient/Family Goal for Rehab: PT/OT mod I goals Expected length of stay: 7-10 days Cultural Considerations: [employer] Dietary Needs: Carb mod, med cal, thin liquids Equipment Needs: TBD Pt/Family Agrees to Admission and willing to participate: Yes Program Orientation Provided & Reviewed with Pt/Caregiver Including Roles  & Responsibilities: Yes  Decrease burden of Care through IP rehab admission: N/A  Possible need for SNF placement upon discharge: Not anticipated  Patient Condition: This patient's medical and functional status has changed since the consult dated: 01/16/18 in which the Rehabilitation Physician determined and documented that the patient's condition is appropriate for intensive rehabilitative care in an inpatient rehabilitation facility. See "History of Present Illness" (above) for medical update. Functional changes are:  Currently requiring minguard to supervision to ambulate 50 feet RW. Patient's medical and functional status update has been discussed with the Rehabilitation physician and patient remains appropriate for inpatient rehabilitation. Will admit to inpatient rehab today.  Preadmission Screen Completed By:  David Lane, 01/20/2018 1:15 PM ______________________________________________________________________   Discussed status with Dr. Naaman Plummer on 01/20/18 at 1314 and received telephone approval for admission today.  Admission Coordinator:  David Lane, time 1315/Date 01/20/18             Cosigned by: Meredith Staggers, MD at 01/20/2018 1:31 PM  Revision History

## 2018-01-23 NOTE — Progress Notes (Signed)
Physical Therapy Session Note  Patient Details  Name: David Lane MRN: [id number] Date of Birth: [birth year]  Today's Date: 01/23/2018 PT Individual Time: 2202-5427 PT Individual Time Calculation (min): 57 min   Short Term Goals: Week 1:  PT Short Term Goal 1 (Week 1): =LTGs due to ELOS  Skilled Therapeutic Interventions/Progress Updates:    Pt seated in w/c upon PT arrival, agreeable to therapy tx and denies pain. Pt propelled w/c from room>gym x 200 ft with supervision using B UEs. Pt transferred from w/c>mat squat pivot with min assist. Pt transferred to supine with supervision. Pt performed LE therex for strengthening, 2 x 10 of each. SLR, hip abduction, hip flexion, heel slides, sidelying hip extension, SAQ, prone hip extension, prone hamstring curls and prone press up. Pt transferred to sitting edge of mat with supervision performed ankle pumps and LAQ 2 x 10 for strengthening. Pt worked on dynamic standing balance with single UE support on walker to toss horse shoes, supervision. Pt performed x 5 sit<>stands with supervision and verbal cues for techniques. Squat pivot back to w/c min assist, propelled back to room with supervision and left seated with QRB in place and chair alarm set.   Therapy Documentation Precautions:  Precautions Precautions: Fall Precaution Comments: Wound vac, L midfoot amputation Restrictions Weight Bearing Restrictions: Yes LUE Weight Bearing: Non weight bearing LLE Weight Bearing: Non weight bearing   See Function Navigator for Current Functional Status.   Therapy/Group: Individual Therapy  Netta Corrigan, PT, DPT 01/23/2018, 3:43 PM

## 2018-01-23 NOTE — Progress Notes (Signed)
Occupational Therapy Session Note  Patient Details  Name: David Lane MRN: [id number] Date of Birth: 18-Jun-1946  Today's Date: 01/23/2018 OT Individual Time: 0945-1100 OT Individual Time Calculation (min): 75 min    Short Term Goals: Week 1:  OT Short Term Goal 1 (Week 1): n/a d/t ELOS  Skilled Therapeutic Interventions/Progress Updates:    Pt seen this session for BADL training with a focus on safe mobility.  Pt received in bed and spoke at length about his hospital experience.  Pt took awhile to get redirected.  Pt was able to stand pivot from bed to w/c to toilet and back to w/c with S.  He sat on toilet without S (pt prefers privacy), he called for supervision when getting back to w/c from toilet.  Pt completed bathing at sink. He declined donning clothing as he only has jeans. Pt requested the cotton hospital gowns.  Pt requested to stay in w/c.  Pt stated he understands safety rules and will not try to get up without calling for assistance.  Call light in reach.   Therapy Documentation Precautions:  Precautions Precautions: Fall Precaution Comments: Wound vac, L midfoot amputation Restrictions Weight Bearing Restrictions: Yes LUE Weight Bearing: Non weight bearing LLE Weight Bearing: Non weight bearing      Pain: Pain Assessment Pain Score: 0-No pain    ADL:   See Function Navigator for Current Functional Status.   Therapy/Group: Individual Therapy  David Lane 01/23/2018, 12:41 PM

## 2018-01-23 NOTE — Progress Notes (Signed)
Patient information reviewed and entered into eRehab system by Anaeli Cornwall, RN, CRRN, PPS Coordinator.  Information including medical coding and functional independence measure will be reviewed and updated through discharge.     Per nursing patient was given "Data Collection Information Summary for Patients in Inpatient Rehabilitation Facilities with attached "Privacy Act Statement-Health Care Records" upon admission.  Present in education notebook.  

## 2018-01-23 NOTE — Progress Notes (Signed)
Social Work  Social Work Assessment and Plan  Patient Details  Name: David Lane MRN: [id number] Date of Birth: June 04, 1946  Today's Date: 01/23/2018  Problem List:  Patient Active Problem List   Diagnosis Date Noted  . Functional gait disorder 01/20/2018  . Acute blood loss anemia 01/20/2018  . Status post transmetatarsal amputation of left foot ([place]) 01/20/2018  . Cutaneous abscess of left foot   . Subacute osteomyelitis, left ankle and foot ([place])   . Sepsis due to cellulitis ([place]) 01/12/2018  . Foot ulcer due to secondary DM ([place]) 01/04/2018  . Status post coronary artery stent placement   . Acute ST elevation myocardial infarction (STEMI) involving left anterior descending (LAD) coronary artery ([place]) 12/20/2017  . 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 ([place]) 12/14/2015  . Wellness examination 11/25/2014  . Screening for prostate cancer 11/25/2014  . Pancreatic insufficiency 01/25/2013  . Ischemic cardiomyopathy, EF 50% by echo 08/01/13 07/19/2012  . Chronic combined systolic and diastolic CHF (congestive heart failure) ([place]) 07/14/2012  . Pulmonary edema, most likely due to diastolic dysfunction 25/95/6387  . CAD, 07/14/12- LAD/PDA DES   . ANXIETY 12/12/2009  . Hyperlipidemia 04/24/2007  . Essential hypertension 04/24/2007  . PANCREATITIS, HX OF 04/24/2007   Past Medical History:  Past Medical History:  Diagnosis Date  . CAD (coronary artery disease)    last cath by [facility] DR.  Mihai Croitoru showing  some  disease involving LCX and small size of Diag   . CHF exacerbation, due to diastolic dysfunction 56/43/3295  . Chronic systolic CHF (congestive heart failure), NYHA class 1 ([place]) 07/17/2012  . GERD (gastroesophageal reflux disease)   . Hepatic lesion 02/04/11  . Hyperlipemia   . Hypertension   . Ischemic cardiomyopathy    EF 35-40%  . Liver hemangioma   . NSTEMI (non-ST elevated myocardial infarction) ([place]) 11/21/2009  . Pancreatitis 2000's  .  Shortness of breath   . Type II diabetes mellitus ([place])    Past Surgical History:  Past Surgical History:  Procedure Laterality Date  . AMPUTATION Left 01/14/2018   Procedure: TRANSMETATARSAL AMPUTATION;  Surgeon: Newt Minion, MD;  Location: [facility];  Service: Orthopedics;  Laterality: Left;  . CARDIAC CATHETERIZATION  2011   minimal disease, medical management  . CORONARY ANGIOPLASTY WITH STENT PLACEMENT  07/14/2012   successful PCI & stenting of mid LAD & PDA off the dominant CX  . CORONARY/GRAFT ACUTE MI REVASCULARIZATION N/A 12/20/2017   Procedure: Coronary/Graft Acute MI Revascularization;  Surgeon: Martinique, Peter M, MD;  Location: [facility];  Service: Cardiovascular;  Laterality: N/A;  . INCISION AND DRAINAGE ABSCESS ANAL  1970's  . LEFT HEART CATH AND CORONARY ANGIOGRAPHY N/A 12/20/2017   Procedure: LEFT HEART CATH AND CORONARY ANGIOGRAPHY;  Surgeon: Martinique, Peter M, MD;  Location: [facility];  Service: Cardiovascular;  Laterality: N/A;  . LEFT HEART CATHETERIZATION WITH CORONARY ANGIOGRAM N/A 07/14/2012   Procedure: LEFT HEART CATHETERIZATION WITH CORONARY ANGIOGRAM;  Surgeon: [name], MD;  Location: [facility] CATH [facility];  Service: Cardiovascular;  Laterality: N/A;  . PERCUTANEOUS CORONARY STENT INTERVENTION (PCI-S) Right 07/14/2012   Procedure: PERCUTANEOUS CORONARY STENT INTERVENTION (PCI-S);  Surgeon: [name], MD;  Location: [facility] C[facility];  Service: Cardiovascular;  Laterality: Right;   Social History:  reports that he has never smoked. He has never used smokeless tobacco. He reports that he does not drink alcohol or use drugs.  Family / Support Systems Marital Status: Single Patient Roles: Partner, Other (  Comment)(brother) Spouse/Significant Other: Linda Vuncannon-girlfriend [phone] Other Supports: Vicente Males Rose-sister [phone] Anticipated Caregiver: Industrial/product designer, sister and girlfriend Ability/Limitations of Caregiver: Sister and Vaughan Basta have been helping since  stent surgery Caregiver Availability: Intermittent Family Dynamics: Pt is close with Vaughan Basta been with for years and sister is very involved. Both will assist him if needed. Pt is one who is very independent and has always taken care of himself and plans to again. He does not like to rely upon others.  Social History Preferred language: English Religion: Baptist Cultural Background: No issues Education: Teaching laboratory technician Read: Yes Write: Yes Employment Status: Retired Freight forwarder Issues: No issues  Guardian/Conservator: none-according to MD pt is capable of making his own decisions while here   Abuse/Neglect Abuse/Neglect Assessment Can Be Completed: Yes Physical Abuse: Denies Verbal Abuse: Denies Sexual Abuse: Denies Exploitation of patient/patient's resources: Denies Self-Neglect: Denies  Emotional Status Pt's affect, behavior adn adjustment status: Pt is motivated and very positive regarding his recovery form his amputation. He is glad his whole leg did not need to be amputated. He is trying to remain optimistic and positive. He knows it will take time but he will get there. Recent Psychosocial Issues: recent stent surgery he was recovering from when this happened Pyschiatric History: No history deferred depression screen due to very vocal regarding his concerns and feelings. He would benefit from seeing a peer support person. Will make referral and talk to the other pt on the unit Substance Abuse History: No issues  Patient / Family Perceptions, Expectations & Goals Pt/Family understanding of illness & functional limitations: Pt is able to explain his amputation and talks with Dr Sharol Given daily regarding his treatment plan and recovery from this. He is aware the vac will come off soon but up to MD. He has a good understanding of his plan. Premorbid pt/family roles/activities: Retiree, partner, brother, friend, etc Anticipated changes in  roles/activities/participation: resume Pt/family expectations/goals: Pt states: " I plan on being independent before I go home, I can manage if I can't put my foot down." " I hope I can by the time I leave here."  [employer]: None Premorbid Home Care/DME Agencies: None Transportation available at discharge: Vaughan Basta and sister Resource referrals recommended: Support group (specify), Other (Comment)(Peer support)  Discharge Planning Living Arrangements: Alone Support Systems: Spouse/significant other, Other relatives, Friends/neighbors Type of Residence: Private residence Insurance Resources: Multimedia programmer (specify)([employer]) Financial Resources: Radio broadcast assistant Screen Referred: No Living Expenses: Rent Money Management: Patient Does the patient have any problems obtaining your medications?: No Home Management: Self sister and Vaughan Basta will help unitl he can again Patient/Family Preliminary Plans: Return home with Vaughan Basta and his sister checking in on him, but will not be staying with him. He feels he will be able to manage even if he is hopping due to NWB. He is awaiting MD taking off the vac. Will work on discharge needs.  Social Work Anticipated Follow Up Needs: HH/OP, Support Group  Clinical Impression Pleasant very talkative gentleman who is motivated and optimistic regarding his recovery form his amputation. He is glad he didn't need his leg amputated. He has good support via sister and girlfriend who will assist him. Will work on discharge needs. Would benefit from peer support person.  Elease Hashimoto 01/23/2018, 1:11 PM

## 2018-01-23 NOTE — Care Management Note (Signed)
[facility] Individual Statement of Services  Patient Name:  David Lane  Date:  01/23/2018  Welcome to the [facility].  Our goal is to provide you with an individualized program based on your diagnosis and situation, designed to meet your specific needs.  With this comprehensive rehabilitation program, you will be expected to participate in at least 3 hours of rehabilitation therapies Monday-Friday, with modified therapy programming on the weekends.  Your rehabilitation program will include the following services:  Physical Therapy (PT), Occupational Therapy (OT), 24 hour per day rehabilitation nursing, Neuropsychology, Case Management (Social Worker), Rehabilitation Medicine, Nutrition Services and Pharmacy Services  Weekly team conferences will be held on Wednesday to discuss your progress.  Your Social Worker will talk with you frequently to get your input and to update you on team discussions.  Team conferences with you and your family in attendance may also be held.  Expected length of stay: 7-9 days  Overall anticipated outcome: mod/i level  Depending on your progress and recovery, your program may change. Your Social Worker will coordinate services and will keep you informed of any changes. Your Social Worker's name and contact numbers are listed  below.  The following services may also be recommended but are not provided by the [facility] will be made to provide these services after discharge if needed.  Arrangements include referral to agencies that provide these services.  Your insurance has been verified to be:  [place] primary doctor is:  Stevie Kern  Pertinent information will be shared with your doctor and your insurance company.  Social Worker:  Ovidio Kin, [place] or ([phone]  Information discussed with and copy given to patient by: Elease Hashimoto, 01/23/2018, 10:54 AM

## 2018-01-23 NOTE — Progress Notes (Signed)
[facility] PHYSICAL MEDICINE & REHABILITATION     PROGRESS NOTE    Subjective/Complaints:  No issues overnite, no foot pain   ROS: Patient denies fever, rash, sore throat, blurred vision, nausea, vomiting, diarrhea, cough, shortness of breath or chest pain, joint or back pain, headache, or mood change. .   Objective: Vital Signs: Blood pressure 131/78, pulse 84, temperature 98 F (36.7 C), temperature source Oral, resp. rate 15, height 6' (1.829 m), weight 86.1 kg (189 lb 13.1 oz), SpO2 99 %. No results found. Recent Labs    01/21/18 0610  WBC 7.9  HGB 9.8*  HCT 31.0*  PLT 480*   Recent Labs    01/21/18 0610  NA 137  K 3.9  CL 102  GLUCOSE 114*  BUN 20  CREATININE 1.20  CALCIUM 8.3*   CBG (last 3)  Recent Labs    01/22/18 1620 01/22/18 2109 01/23/18 0627  GLUCAP 122* 228* 168*    Wt Readings from Last 3 Encounters:  01/23/18 86.1 kg (189 lb 13.1 oz)  01/13/18 88.9 kg (196 lb)  01/04/18 88.9 kg (196 lb)    Physical Exam:  Constitutional: No distress . Vital signs reviewed. HEENT: EOMI, oral membranes moist Neck: supple Cardiovascular: RRR without murmur. No JVD    Respiratory: CTA Bilaterally without wheezes or rales. Normal effort    GI: BS +, non-tender, non-distended  Musculoskeletal:  Left transmetatarsal amputation site with VAC in place.  Hard callus under right great toe. Left foot appropriately tender/intact. No edema Neurological: He isalertand oriented to person, place, and time. Nocranial nerve deficit.   Insight and awareness UE motor 5/5. LLE limited by pain but 3+ to 4/5. RLE 4/5. Decreased LT distally RLE-toes Skin: Skin iswarm.  Psychiatric: Pleasant    Assessment/Plan: 1.  Functional mobility deficits secondary to left transmetatarsal amputation which require 3+ hours per day of interdisciplinary therapy in a comprehensive inpatient rehab setting. Physiatrist is providing close team supervision and 24 hour management of  active medical problems listed below. Physiatrist and rehab team continue to assess barriers to discharge/monitor patient progress toward functional and medical goals.  Function:  Bathing Bathing position   Position: Wheelchair/chair at sink  Bathing parts Body parts bathed by patient: Right arm, Left arm, Chest, Abdomen, Left lower leg, Front perineal area, Buttocks, Right upper leg, Left upper leg, Right lower leg Body parts bathed by helper: Back  Bathing assist Assist Level: Supervision or verbal cues      Upper Body Dressing/Undressing Upper body dressing   What is the patient wearing?: Button up shirt         Button up shirt - Perfomed by patient: Thread/unthread right sleeve, Thread/unthread left sleeve, Pull shirt around back, Button/unbutton shirt      Upper body assist Assist Level: Supervision or verbal cues      Lower Body Dressing/Undressing Lower body dressing   What is the patient wearing?: Pants     Pants- Performed by patient: Thread/unthread right pants leg, Thread/unthread left pants leg, Pull pants up/down, Fasten/unfasten pants                        Lower body assist Assist for lower body dressing: Supervision or verbal cues      Toileting Toileting   Toileting steps completed by patient: Adjust clothing prior to toileting, Performs perineal hygiene, Adjust clothing after toileting   Toileting Assistive Devices: Grab bar or rail  Toileting assist Assist level: Touching or  steadying assistance (Pt.75%)   Transfers Chair/bed transfer   Chair/bed transfer method: Stand pivot Chair/bed transfer assist level: Touching or steadying assistance (Pt > 75%) Chair/bed transfer assistive device: Medical sales representative     Max distance: 48' Assist level: Touching or steadying assistance (Pt > 75%)   Wheelchair   Type: Manual Max wheelchair distance: 150' Assist Level: Supervision or verbal cues  Cognition Comprehension  Comprehension assist level: Follows basic conversation/direction with no assist  Expression Expression assist level: Expresses complex 90% of the time/cues < 10% of the time  Social Interaction Social Interaction assist level: Interacts appropriately 90% of the time - Needs monitoring or encouragement for participation or interaction.  Problem Solving Problem solving assist level: Solves complex 90% of the time/cues < 10% of the time  Memory Memory assist level: Complete Independence: No helper   Medical Problem List and Plan: 1.Functional deficitssecondary to left transmetatarsal amputation -Continue PT and OT 2. DVT Prophylaxis/Anticoagulation: Pharmaceutical:Lovenox 3. Pain Management: denies curren tpain 4. Mood:LCSW to follow for evaluation and support 5. Neuropsych: This patientiscapable of making decisions on hisown behalf. 6. Skin/Wound Care:Continue Prevena VAC. Follow up with surgical team re: duration of use on Monday 7. Fluids/Electrolytes/Nutrition: .  Encourage p.o. intake.  Have added protein supplement.  8. T1DMpoorly controlled:peripheral neuropathy Monitor BS ac/hs. Continue Lantus bid with meal coverage for better control.    -Patient appears to have awareness that he has to take better control of his blood sugars.  Dietary and diabetes education will be provided.  -Glucose control still suboptimal.  Increase morning Lantus to 55 units in maintain evening Lantus at 5 units CBG (last 3)  Recent Labs    01/22/18 1620 01/22/18 2109 01/23/18 0627  GLUCAP 122* 228* 168*  one spike will monitor on current meds 9. HTN: Monitor BP bid. Continue coreg bid Vitals:   01/22/18 2200 01/23/18 0531  BP: 130/72 131/78  Pulse: 88 84  Resp:  15  Temp:  98 F (36.7 C)  SpO2: 100% 99%  Controlled 4/29 10 Chronic combined systolic diastolic CHF: Heart healthy diet--compensated on coreg, Crestor and ASA. Off spironolactone and lisinopril at this time due to  issue with hypotension.    Filed Weights   01/21/18 0531 01/22/18 0454 01/23/18 0531  Weight: 85.8 kg (189 lb 2.5 oz) 86 kg (189 lb 9.5 oz) 86.1 kg (189 lb 13.1 oz)   11. CAD s/p recent stent: On Brilinta, ASA and Crestor.  12 ABLA: Hemoglobin 9.8 .  Continue to follow 13. AKI: Due to vancomycin. Encourage fluid intake. Continue to monitor closely for recovery.   LOS (Days) [place] EVALUATION WAS PERFORMED  Charlett Blake, MD 01/23/2018 7:57 AM

## 2018-01-23 NOTE — Progress Notes (Signed)
Occupational Therapy Session Note  Patient Details  Name: David Lane MRN: [id number] Date of Birth: [birth year]  Today's Date: 01/23/2018 OT Individual Time: 1400-1445 OT Individual Time Calculation (min): 45 min    Short Term Goals: Week 1:  OT Short Term Goal 1 (Week 1): n/a d/t ELOS  Skilled Therapeutic Interventions/Progress Updates:    Pt greeted sitting in wc, reporting need to go to the bathroom. Pt unwilling to listen to OT's  Safety recommendations for wc placement and refused to swing leg rests out of the way. Pt was still able to complete toilet transfer with supervision. Reviewed reason for safety precautions and pt verbalized understanding. Pt with successful BM and complete hygiene with supervision. Hand washing completed sitting in wc at the sink. B UE strength/coordination with wc propulsion to day room. Pt more agreeable to education on safe technique to transfer wc to arm-bike seat. Min cues for locating where to swing leg rests out. 5 mins x2 on [employer] arm bike on level 3 with RPMs above 35. B UE strength and wc management with wc obstacle course. Pt then returned to room and left seated in wc with chair alarm on and needs met.  Therapy Documentation Precautions:  Precautions Precautions: Fall Precaution Comments: Wound vac, L midfoot amputation Restrictions Weight Bearing Restrictions: Yes LUE Weight Bearing: Non weight bearing LLE Weight Bearing: Non weight bearing Pain: Pain Assessment Pain Score: 0-No pain  See Function Navigator for Current Functional Status.   Therapy/Group: Individual Therapy  Valma Cava 01/23/2018, 2:09 PM

## 2018-01-23 NOTE — Progress Notes (Signed)
Resting at interval throughout shift appears asleep upon rounding in no acute distress or discomfort. respiration even and unlabored on room air, coloration adequate, Wound pump intact w/o issues or concerns,Reluctant with staff when attempting to monitor at interval while up in BR, reviewed unit safety policy and procedure Monitor closely

## 2018-01-23 NOTE — Progress Notes (Signed)
Meredith Staggers, MD  Physician  Physical Medicine and Rehabilitation  Consult Note  Signed  Date of Service:  01/16/2018 10:48 AM       Related encounter: ED to Hosp-Admission (Discharged) from 01/12/2018 in [place] All Collapse All       Show:Clear all [x] Manual[x] Template[] Copied  Added by: [x] Love, Ivan Anchors, PA-C[x] Meredith Staggers, MD   [] Hover for details        Physical Medicine and Rehabilitation Consult   Reason for Consult: Functional deficits due to transmetatarsal amputation.  Referring Physician: Dr. Charlies Silvers.    HPI: David Lane is a 72 y.o. male with history of CAD with ICM--recent stent,  HTN, pancreatitis, T1DM who was admitted on 01/12/18 with left foot drainage with abscess,  cellulitis and osteomyelitis. He was started on IV antibiotics and underwent transmetatarsal amputation left foot by Dr. Sharol Given on 04/20. To be NWB with recommendations to continue antibiotics for 3 days post op.   PT evaluation done and CIR recommended due to functional deficits.    Review of Systems  HENT: Negative for hearing loss and tinnitus.   Eyes: Negative for blurred vision and double vision.  Cardiovascular: Negative for chest pain.  Gastrointestinal: Negative for heartburn and nausea.  Genitourinary: Negative for dysuria and urgency.  Musculoskeletal: Negative for back pain and myalgias.  Neurological: Positive for dizziness (since surgery) and sensory change. Negative for headaches.  Psychiatric/Behavioral: The patient has insomnia.           Past Medical History:  Diagnosis Date  . CAD (coronary artery disease)    last cath by [facility] DR.  Mihai Croitoru showing  some  disease involving LCX and small size of Diag   . CHF exacerbation, due to diastolic dysfunction 74/25/9563  . Chronic systolic CHF (congestive heart failure), NYHA class 1 ([place]) 07/17/2012  . GERD  (gastroesophageal reflux disease)   . Hepatic lesion 02/04/11  . Hyperlipemia   . Hypertension   . Ischemic cardiomyopathy    EF 35-40%  . Liver hemangioma   . NSTEMI (non-ST elevated myocardial infarction) ([place]) 11/21/2009  . Pancreatitis 2000's  . Shortness of breath   . Type II diabetes mellitus ([place])          Past Surgical History:  Procedure Laterality Date  . AMPUTATION Left 01/14/2018   Procedure: TRANSMETATARSAL AMPUTATION;  Surgeon: Newt Minion, MD;  Location: [place];  Service: Orthopedics;  Laterality: Left;  . CARDIAC CATHETERIZATION  2011   minimal disease, medical management  . CORONARY ANGIOPLASTY WITH STENT PLACEMENT  07/14/2012   successful PCI & stenting of mid LAD & PDA off the dominant CX  . CORONARY/GRAFT ACUTE MI REVASCULARIZATION N/A 12/20/2017   Procedure: Coronary/Graft Acute MI Revascularization;  Surgeon: Martinique, Peter M, MD;  Location: [place] CV [facility];  Service: Cardiovascular;  Laterality: N/A;  . INCISION AND DRAINAGE ABSCESS ANAL  1970's  . LEFT HEART CATH AND CORONARY ANGIOGRAPHY N/A 12/20/2017   Procedure: LEFT HEART CATH AND CORONARY ANGIOGRAPHY;  Surgeon: Martinique, Peter M, MD;  Location: [place] [facility];  Service: Cardiovascular;  Laterality: N/A;  . LEFT HEART CATHETERIZATION WITH CORONARY ANGIOGRAM N/A 07/14/2012   Procedure: LEFT HEART CATHETERIZATION WITH CORONARY ANGIOGRAM;  Surgeon: [name], MD;  Location: [place] CATH LAB;  Service: Cardiovascular;  Laterality: N/A;  . PERCUTANEOUS CORONARY STENT INTERVENTION (PCI-S) Right 07/14/2012   Procedure: PERCUTANEOUS CORONARY STENT  INTERVENTION (PCI-S);  Surgeon: [name], MD;  Location[facility] CATH LAB;  Service: Cardiovascular;  Laterality: Right;         Family History  Problem Relation Age of Onset  . Diabetes Mother   . Diabetes Paternal Grandmother   . Diabetes Maternal Grandfather   . Colon cancer Neg Hx     Social History:  Lives alone and independent  PTA. Sister and/girlfriend have been helping out for the past 2 weeks. He reports that he has never smoked. He has never used smokeless tobacco. He reports that he does not drink alcohol or use drugs.         Allergies  Allergen Reactions  . Lipitor [Atorvastatin] Other (See Comments)    Muscle weakness  . Metformin And Related Diarrhea    Severe diarrhea         Medications Prior to Admission  Medication Sig Dispense Refill  . aspirin 81 MG tablet Take 81 mg by mouth daily.      . carvedilol (COREG) 3.125 MG tablet Take 1 tablet (3.125 mg total) by mouth 2 (two) times daily with a meal. 60 tablet 6  . Insulin Glargine (LANTUS SOLOSTAR) 100 UNIT/ML Solostar Pen Inject 15 Units into the skin daily at 10 pm. NEED APPOINTMENT (Patient taking differently: Inject 80 Units into the skin daily before breakfast. ) 9 pen 1  . lisinopril (PRINIVIL,ZESTRIL) 20 MG tablet Take 0.5 tablets (10 mg total) by mouth daily. (Patient taking differently: Take 10 mg by mouth 2 (two) times daily. ) 180 tablet 3  . pantoprazole (PROTONIX) 40 MG tablet Take 1 tablet (40 mg total) by mouth daily. 90 tablet 3  . rosuvastatin (CRESTOR) 40 MG tablet Take 1 tablet (40 mg total) by mouth at bedtime. 30 tablet 1  . ticagrelor (BRILINTA) 90 MG TABS tablet Take 1 tablet (90 mg total) by mouth 2 (two) times daily. 180 tablet 3  . glucose blood (ACCU-CHEK AVIVA) test strip 4 (four) times daily. DX code e11.9 100 each 0  . insulin aspart (NOVOLOG) 100 UNIT/ML injection Inject 5 Units into the skin 3 (three) times daily with meals. 10 mL 11  . Insulin Pen Needle (B-D UF III MINI PEN NEEDLES) 31G X 5 MM MISC use to inject insulin daily 100 each 0    Home: Home Living Family/patient expects to be discharged to:: Private residence Living Arrangements: Alone Type of Home: House Home Access: Stairs to enter Technical brewer of Steps: 3 Entrance Stairs-Rails: None Home Layout: One level Home Equipment: None    Functional History: Prior Function Level of Independence: Independent Functional Status:  Mobility: Bed Mobility Overal bed mobility: Modified Independent Transfers Overall transfer level: Needs assistance Equipment used: Rolling walker (2 wheeled) Transfers: Sit to/from Stand Sit to Stand: Min assist General transfer comment: assist for sequencing and safety Ambulation/Gait Ambulation/Gait assistance: Mod assist Ambulation Distance (Feet): 2 Feet Assistive device: Rolling walker (2 wheeled) General Gait Details: just took a couple of steps to the chair, very unsteady and nervous with walker.  ADL:  Cognition: Cognition Overall Cognitive Status: Within Functional Limits for tasks assessed Orientation Level: Oriented X4 Cognition Arousal/Alertness: Awake/alert Behavior During Therapy: WFL for tasks assessed/performed Overall Cognitive Status: Within Functional Limits for tasks assessed  Blood pressure 117/65, pulse 77, temperature 98 F (36.7 C), temperature source Oral, resp. rate 20, height 6' (1.829 m), weight 88.9 kg (196 lb), SpO2 96 %. Physical Exam  Nursing note and vitals reviewed. Constitutional: He is oriented to  person, place, and time. He appears well-developed and well-nourished. No distress.  HENT:  Head: Normocephalic and atraumatic.  Mouth/Throat: Oropharynx is clear and moist.  Eyes: Pupils are equal, round, and reactive to light. Conjunctivae and EOM are normal. Right eye exhibits no discharge.  Neck: Normal range of motion. Neck supple.  Cardiovascular: Normal rate and regular rhythm.  Respiratory: Effort normal and breath sounds normal. No stridor. No respiratory distress. He has no wheezes.  GI: Bowel sounds are normal. He exhibits no distension. There is no tenderness.  Musculoskeletal: He exhibits edema.  Left foot transmet amputation with VAC in place. Right foot without deformities.   Neurological: He is alert and oriented to person, place,  and time.  UE motor nearly 5/5. Able to lift leg foot off chair. RLE: 4/5 prox to distal. Decreased LT distal RLE  Skin: Skin is warm and dry. He is not diaphoretic.  Psychiatric: He has a normal mood and affect. His behavior is normal.  verbose             Assessment/Plan: Diagnosis: PAD s/p left TMA with subsequent gait disorder 1. Does the need for close, 24 hr/day medical supervision in concert with the patient's rehab needs make it unreasonable for this patient to be served in a less intensive setting? Yes 2. Co-Morbidities requiring supervision/potential complications: wound care, poorly controlled diabetes, CHF, htn, wound care/vac 3. Due to bladder management, bowel management, safety, skin/wound care, disease management, medication administration, pain management and patient education, does the patient require 24 hr/day rehab nursing? Yes 4. Does the patient require coordinated care of a physician, rehab nurse, PT (1-2 hrs/day, 5 days/week) and OT (1-2 hrs/day, 5 days/week) to address physical and functional deficits in the context of the above medical diagnosis(es)? Yes Addressing deficits in the following areas: balance, endurance, locomotion, strength, transferring, bowel/bladder control, bathing, dressing, feeding, grooming, toileting and psychosocial support 5. Can the patient actively participate in an intensive therapy program of at least 3 hrs of therapy per day at least 5 days per week? Yes 6. The potential for patient to make measurable gains while on inpatient rehab is excellent 7. Anticipated functional outcomes upon discharge from inpatient rehab are modified independent  with PT, modified independent with OT, n/a with SLP. 8. Estimated rehab length of stay to reach the above functional goals is: 12-15 days 9. Anticipated D/C setting: Home 10. Anticipated post D/C treatments: [facility] therapy 11. Overall Rehab/Functional Prognosis: excellent  RECOMMENDATIONS: This  patient's condition is appropriate for continued rehabilitative care in the following setting: CIR Patient has agreed to participate in recommended program. Yes Note that insurance prior authorization may be required for reimbursement for recommended care.  Comment: Rehab Admissions Coordinator to follow up.  Thanks,  Meredith Staggers, MD, Mellody Drown     Bary Leriche, PA-C 01/16/2018          Revision History                        Routing History

## 2018-01-24 ENCOUNTER — Inpatient Hospital Stay (HOSPITAL_COMMUNITY): Payer: Medicare Other | Admitting: Physical Therapy

## 2018-01-24 ENCOUNTER — Inpatient Hospital Stay (HOSPITAL_COMMUNITY): Payer: Medicare Other | Admitting: Occupational Therapy

## 2018-01-24 ENCOUNTER — Inpatient Hospital Stay (HOSPITAL_COMMUNITY): Payer: Medicare Other

## 2018-01-24 LAB — BASIC METABOLIC PANEL
Anion gap: 9 (ref 5–15)
BUN: 29 mg/dL — ABNORMAL HIGH (ref 6–20)
CALCIUM: 8.6 mg/dL — AB (ref 8.9–10.3)
CHLORIDE: 103 mmol/L (ref 101–111)
CO2: 28 mmol/L (ref 22–32)
CREATININE: 1.29 mg/dL — AB (ref 0.61–1.24)
GFR, EST NON AFRICAN AMERICAN: 55 mL/min — AB (ref >60)
Glucose, Bld: 145 mg/dL — ABNORMAL HIGH (ref 65–99)
Potassium: 4.3 mmol/L (ref 3.5–5.1)
SODIUM: 140 mmol/L (ref 135–145)

## 2018-01-24 LAB — CBC
HCT: 31 % — ABNORMAL LOW (ref 39.0–52.0)
Hemoglobin: 9.9 g/dL — ABNORMAL LOW (ref 13.0–17.0)
MCH: 29.8 pg (ref 26.0–34.0)
MCHC: 31.9 g/dL (ref 30.0–36.0)
MCV: 93.4 fL (ref 78.0–100.0)
PLATELETS: 396 10*3/uL (ref 150–400)
RBC: 3.32 MIL/uL — AB (ref 4.22–5.81)
RDW: 13.2 % (ref 11.5–15.5)
WBC: 9 10*3/uL (ref 4.0–10.5)

## 2018-01-24 LAB — GLUCOSE, CAPILLARY
GLUCOSE-CAPILLARY: 140 mg/dL — AB (ref 65–99)
GLUCOSE-CAPILLARY: 79 mg/dL (ref 65–99)
Glucose-Capillary: 124 mg/dL — ABNORMAL HIGH (ref 65–99)
Glucose-Capillary: 140 mg/dL — ABNORMAL HIGH (ref 65–99)

## 2018-01-24 NOTE — Progress Notes (Signed)
Physical Therapy Session Note  Patient Details  Name: David Lane MRN: [id number] Date of Birth: 09-23-1946  Today's Date: 01/24/2018 PT Individual Time: 1000-1111 PT Individual Time Calculation (min): 71 min   Short Term Goals: Week 1:  PT Short Term Goal 1 (Week 1): =LTGs due to ELOS  Skilled Therapeutic Interventions/Progress Updates:    Pt seated in w/c upon PT arrival, agreeable to therapy tx and denies pain. Pt propelled w/c from room>gym x 200 ft with B UEs and supervision. Pt transferred from w/c>mat stand pivot with min assist using RW. Pt performed core strengthening exercises with 6lb ball to perform twists x 20 and partial sit ups from wedge 2 x 10. Pt performed LE strengthening exercises seated edge of mat, 2 x 10 of each: hip abduction with orange theraband, hamstring curls with orange theraband, LAQ and ball squeezes. Pt performed sit<>stand with RW and supervision, performed standing LE strengthening exercises 2 x 10 L LE hip extension with orange theraband and 2 x 10 L LE hip abduction with orange theraband for resistance. Pt performed x 10 single leg sit<>stands with R LE for strengthening, use of RW and NWB L LE. Pt propelled w/c to rehab apartment with supervision and worked on propulsion around furniture/obstacles. Pt performed stand pivot with RW from w/c<>bed min assist and squat pivot from w/c<>recliner with min assist. Pt propelled w/c outside on unlevel surfaces, inclines and declines with supervision >150 ft. Pt worked on dynamic standing balance with single UE support on RW to toss horse shoes, x 2 trials, supervision.  Pt propelled w/c back to room with supervision x 150 ft using B UEs, left seated in w/c with needs in reach and chair alarm set.   Therapy Documentation Precautions:  Precautions Precautions: Fall Precaution Comments: Wound vac, L midfoot amputation Restrictions Weight Bearing Restrictions: Yes LUE Weight Bearing: Non weight bearing LLE Weight  Bearing: Non weight bearing   See Function Navigator for Current Functional Status.   Therapy/Group: Individual Therapy  Netta Corrigan, PT, DPT 01/24/2018, 7:52 AM

## 2018-01-24 NOTE — Progress Notes (Signed)
Physical Therapy Session Note  Patient Details  Name: David Lane MRN: [id number] Date of Birth: 1946-06-29  Today's Date: 01/24/2018 PT Individual Time: 0805-0900 PT Individual Time Calculation (min): 55 min   Short Term Goals: Week 1:  PT Short Term Goal 1 (Week 1): =LTGs due to ELOS  Skilled Therapeutic Interventions/Progress Updates: Pt presented in bed agreeable to therapy. Pt denies pain at present.  Performed supine to sit at EOB with supervision and use of features. At EOB nsg entered room to administer meds. Performed squat pivot transfer to w/c with supervision. Pt propelled to rehab gym supervision for endurance. Performed squat pivot to mat and participated in standing activity tolerance with RW including placing clothes pins on basketball net and use of peg board. Pt able to maintaining standing with RW periods up to 5 min without rest break. Pt returned to w/c via squat pivot and propelled back to room in same manner as prior. Pt remained in w/c at end of session with call bell within reach and needs met.      Therapy Documentation Precautions:  Precautions Precautions: Fall Precaution Comments: Wound vac, L midfoot amputation Restrictions Weight Bearing Restrictions: Yes LUE Weight Bearing: Non weight bearing LLE Weight Bearing: Non weight bearing   See Function Navigator for Current Functional Status.   Therapy/Group: Individual Therapy  Aerith Canal  Shavonda Wiedman, PTA  01/24/2018, 12:43 PM

## 2018-01-24 NOTE — Progress Notes (Signed)
Occupational Therapy Session Note  Patient Details  Name: David Lane MRN: [id number] Date of Birth: 12/13/1945  Today's Date: 01/24/2018 OT Individual Time: 1400-1458 OT Individual Time Calculation (min): 58 min   Short Term Goals: Week 1:  OT Short Term Goal 1 (Week 1): n/a d/t ELOS  Skilled Therapeutic Interventions/Progress Updates:    Pt greeted sitting in wc, pt declined any bathing/dressing this session. B UE strength/coordination with wc propulsion to therapy gym. Pt able to maneuver wc to therapy mat, lock breaks, and remove leg rests with min cues. B UE strengthening with green thera-band. Bicep curl, triceps press, 10 reps x3 sets.Chest press and straight arm raises with 2lb weighted dow rod- 10 reps x3 sets.  Pt propelled wc back to room and worked on LB dressing to don underwear. Pt needed assistance to thread wound vac, then needed min guard A for balance when reaching outside base of support to pull up pants. Pt left seated in wc with chair alarm on and needs met.   Therapy Documentation Precautions:  Precautions Precautions: Fall Precaution Comments: Wound vac, L midfoot amputation Restrictions Weight Bearing Restrictions: Yes LUE Weight Bearing: Non weight bearing LLE Weight Bearing: Non weight bearing Pain:   none/denies pain See Function Navigator for Current Functional Status.   Therapy/Group: Individual Therapy  Elisabeth S Doe 01/24/2018, 2:28 PM  

## 2018-01-24 NOTE — IPOC Note (Signed)
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 (HCC)   Subacute osteomyelitis, left ankle and foot (HCC)   Acute blood loss anemia   Status post transmetatarsal amputation of left foot ([place])     Functional Problem List: Nursing Endurance, Sensory, Skin Integrity  PT Balance, Endurance, Safety, Sensory  OT Balance, Safety, Endurance  SLP    TR         Basic ADL's: OT Grooming, Bathing, Dressing, Toileting     Advanced  ADL's: OT Simple Meal Preparation     Transfers: PT Bed Mobility, Bed to Chair, Car, [employer], Futures trader, Metallurgist: PT Ambulation, Stairs, Emergency planning/management officer     Additional Impairments: OT None  SLP        TR      Anticipated Outcomes Item Anticipated Outcome  Self Feeding    Swallowing      Basic self-care  MOD I  Toileting  MOD I   Bathroom Transfers MOD I  Bowel/Bladder  Remain continent x 2 with LBM 04/26  Transfers  Mod I  Locomotion  Mod I household gait  Communication     Cognition     Pain  less than 2  Safety/Judgment  to remain fall free   Therapy Plan: PT Intensity: Minimum of 1-2 x/day ,45 to 90 minutes PT Frequency: 5 out of 7 days PT Duration Estimated Length of Stay: 7-10 days OT Intensity: Minimum of 1-2 x/day, 45 to 90 minutes OT Frequency: 5 out of 7 days OT Duration/Estimated Length of Stay: 7-9 days      Team Interventions: Nursing Interventions Disease Management/Prevention, Skin Care/Wound Management, Discharge Planning  PT interventions Visual/perceptual remediation/compensation, Stair training, Pain management, Disease  management/prevention, Ambulation/gait training, Training and development officer, DME/adaptive equipment instruction, Patient/family education, Therapeutic Activities, Wheelchair propulsion/positioning, Cognitive remediation/compensation, Psychosocial support, Therapeutic Exercise, Community reintegration, Functional mobility training, Skin care/wound management, UE/LE Strength taining/ROM, Neuromuscular re-education, Splinting/orthotics, Discharge planning, UE/LE Coordination activities  OT Interventions Balance/vestibular training, Disease mangement/prevention, Self Care/advanced ADL retraining, Therapeutic Exercise, Wheelchair propulsion/positioning, DME/adaptive equipment instruction, Pain management, Skin care/wound managment, UE/LE Strength taining/ROM, Community reintegration, Barrister's clerk education, UE/LE Coordination activities, Discharge planning, Functional mobility training, Therapeutic Activities  SLP Interventions    TR Interventions    SW/CM Interventions Discharge Planning, Psychosocial Support, Patient/Family Education   Barriers to Discharge MD  Medical stability  Nursing      PT Inaccessible home environment, Weight bearing restrictions 3 steps to enter home w/ 1 rail  OT Inaccessible home environment, Decreased caregiver support, Home environment access/layout, Lack of/limited family support    SLP      SW       Team Discharge Planning: Destination: PT-Home ,OT-   , SLP-  Projected Follow-up: PT-Home health PT, OT-  Home health OT, SLP-  Projected Equipment Needs: PT-To be determined, OT- 3 in 1 bedside comode, Tub/shower bench, To be determined, SLP-  Equipment Details: PT- , OT-  Patient/family involved in discharge planning: PT- Patient,  OT-Patient, SLP-   MD ELOS: 7-10d Medical Rehab Prognosis:  Good Assessment:  72 year old male with history of coronary artery disease with ischemic cardiomyopathy and recent stent, hypertension, pancreatitis, type 1 diabetes  mellitus, who was  admitted on 01/12/2018 with left drainage with abscess, cellulitis and osteomyelitis. He was started on IV antibiotics and underwent left transmetatarsal amputation by Dr. Sharol Given on 01/14/2018 and isto be nonweightbearing postop. He continues to have blood drainage from transmetatarsal site and VAC to stay in place for another week. Now requiring 24/7 Rehab RN,MD, as well as CIR level PT, OT .  Treatment team will focus on ADLs and mobility with goals set at Mod I    See Team Conference Notes for weekly updates to the plan of care

## 2018-01-24 NOTE — Progress Notes (Signed)
[place] PHYSICAL MEDICINE & REHABILITATION     PROGRESS NOTE    Subjective/Complaints:  Postoperative pain minimal, occasional phantom sensation.  Not taking pain medications per patient. Discussed duration of wound VAC  ROS: Patient denies fever, rash, sore throat, blurred vision, nausea, vomiting, diarrhea, cough, shortness of breath or chest pain, joint or back pain, headache, or mood change. .   Objective: Vital Signs: Blood pressure 123/67, pulse 89, temperature 98.1 F (36.7 C), temperature source Oral, resp. rate 19, height 6' (1.829 m), weight 86 kg (189 lb 9.5 oz), SpO2 100 %. No results found. Recent Labs    01/24/18 0509  WBC 9.0  HGB 9.9*  HCT 31.0*  PLT 396   Recent Labs    01/24/18 0509  NA 140  K 4.3  CL 103  GLUCOSE 145*  BUN 29*  CREATININE 1.29*  CALCIUM 8.6*   CBG (last 3)  Recent Labs    01/23/18 1710 01/23/18 2146 01/24/18 0638  GLUCAP 228* 126* 124*    Wt Readings from Last 3 Encounters:  01/24/18 86 kg (189 lb 9.5 oz)  01/13/18 88.9 kg (196 lb)  01/04/18 88.9 kg (196 lb)    Physical Exam:  Constitutional: No distress . Vital signs reviewed. HEENT: EOMI, oral membranes moist Neck: supple Cardiovascular: RRR without murmur. No JVD    Respiratory: CTA Bilaterally without wheezes or rales. Normal effort    GI: BS +, non-tender, non-distended  Musculoskeletal:  Left transmetatarsal amputation site with VAC in place.  Hard callus under right great toe. Left foot appropriately tender/intact. No edema Neurological: He isalertand oriented to person, place, and time. Nocranial nerve deficit.   Insight and awareness UE motor 5/5. LLE limited by pain but 3+ to 4/5. RLE 4/5. Decreased LT distally RLE-toes Skin: Skin iswarm.  Psychiatric: Pleasant    Assessment/Plan: 1.  Functional mobility deficits secondary to left transmetatarsal amputation which require 3+ hours per day of interdisciplinary therapy in a comprehensive  inpatient rehab setting. Physiatrist is providing close team supervision and 24 hour management of active medical problems listed below. Physiatrist and rehab team continue to assess barriers to discharge/monitor patient progress toward functional and medical goals.  Function:  Bathing Bathing position   Position: Wheelchair/chair at sink  Bathing parts Body parts bathed by patient: Right arm, Left arm, Chest, Abdomen, Left lower leg, Front perineal area, Buttocks, Right upper leg, Left upper leg, Right lower leg Body parts bathed by helper: Back  Bathing assist Assist Level: Supervision or verbal cues      Upper Body Dressing/Undressing Upper body dressing   What is the patient wearing?: Hospital gown         Button up shirt - Perfomed by patient: Thread/unthread right sleeve, Thread/unthread left sleeve, Pull shirt around back, Button/unbutton shirt      Upper body assist Assist Level: Supervision or verbal cues      Lower Body Dressing/Undressing Lower body dressing   What is the patient wearing?: Hospital Gown     Pants- Performed by patient: Thread/unthread right pants leg, Thread/unthread left pants leg, Pull pants up/down, Fasten/unfasten pants                        Lower body assist Assist for lower body dressing: Supervision or verbal cues      Toileting Toileting   Toileting steps completed by patient: Adjust clothing prior to toileting, Performs perineal hygiene, Adjust clothing after toileting   Toileting Assistive Devices:  Grab bar or rail  Toileting assist Assist level: Supervision or verbal cues   Transfers Chair/bed transfer   Chair/bed transfer method: Squat pivot Chair/bed transfer assist level: Touching or steadying assistance (Pt > 75%) Chair/bed transfer assistive device: Armrests     Locomotion Ambulation     Max distance: 68' Assist level: Touching or steadying assistance (Pt > 75%)   Wheelchair   Type: Manual Max wheelchair  distance: 150' Assist Level: Supervision or verbal cues  Cognition Comprehension Comprehension assist level: Follows basic conversation/direction with no assist  Expression Expression assist level: Expresses complex 90% of the time/cues < 10% of the time  Social Interaction Social Interaction assist level: Interacts appropriately 90% of the time - Needs monitoring or encouragement for participation or interaction.  Problem Solving Problem solving assist level: Solves complex 90% of the time/cues < 10% of the time  Memory Memory assist level: Complete Independence: No helper   Medical Problem List and Plan: 1.Functional deficitssecondary to left transmetatarsal amputation -Continue PT and OT, no 2. DVT Prophylaxis/Anticoagulation: Pharmaceutical:Lovenox 3. Pain Management: denies curren tpain 4. Mood:LCSW to follow for evaluation and support 5. Neuropsych: This patientiscapable of making decisions on hisown behalf. 6. Skin/Wound Care:Continue Prevena VAC. Follow up with surgical team re: duration of use on Monday 7. Fluids/Electrolytes/Nutrition: .  Encourage p.o. intake.  Have added protein supplement.  8. T1DMpoorly controlled:peripheral neuropathy Monitor BS ac/hs. Continue Lantus bid with meal coverage for better control.    -Patient appears to have awareness that he has to take better control of his blood sugars.  Dietary and diabetes education will be provided.  -Glucose control still suboptimal.  Increase morning Lantus to 55 units in maintain evening Lantus at 5 units CBG (last 3)  Recent Labs    01/23/18 1710 01/23/18 2146 01/24/18 0638  GLUCAP 228* 126* 124*  one spike will monitor on current meds 9. HTN: Monitor BP bid. Continue coreg bid Vitals:   01/24/18 0440 01/24/18 0808  BP: 108/72 123/67  Pulse: 91 89  Resp: 19   Temp: 98.1 F (36.7 C)   SpO2: 100%   Controlled 4/30 10 Chronic combined systolic diastolic CHF: Heart healthy  diet--compensated on coreg, Crestor and ASA. Off spironolactone and lisinopril at this time due to issue with hypotension.    Filed Weights   01/22/18 0454 01/23/18 0531 01/24/18 0440  Weight: 86 kg (189 lb 9.5 oz) 86.1 kg (189 lb 13.1 oz) 86 kg (189 lb 9.5 oz)   11. CAD s/p recent stent: On Brilinta, ASA and Crestor.  12 ABLA: Hemoglobin 9.8 .  Continue to follow 13. AKI: Due to vancomycin. Encourage fluid intake. Continue to monitor closely for recovery.Creat back to baseline, BUN mildly elevated enc po fluid   LOS (Days) 4 A FACE TO FACE EVALUATION WAS PERFORMED  Charlett Blake, MD 01/24/2018 8:43 AM

## 2018-01-25 ENCOUNTER — Inpatient Hospital Stay (HOSPITAL_COMMUNITY): Payer: Medicare Other

## 2018-01-25 ENCOUNTER — Inpatient Hospital Stay (HOSPITAL_COMMUNITY): Payer: Medicare Other | Admitting: Occupational Therapy

## 2018-01-25 LAB — GLUCOSE, CAPILLARY
GLUCOSE-CAPILLARY: 83 mg/dL (ref 65–99)
GLUCOSE-CAPILLARY: 83 mg/dL (ref 65–99)
GLUCOSE-CAPILLARY: 92 mg/dL (ref 65–99)
Glucose-Capillary: 86 mg/dL (ref 65–99)

## 2018-01-25 NOTE — Progress Notes (Signed)
Occupational Therapy Session Note  Patient Details  Name: David Lane MRN: [id number] Date of Birth: 12/19/1945  Today's Date: 01/25/2018 OT Individual Time: 1300-1330 OT Individual Time Calculation (min): 30 min    Short Term Goals: Week 1:  OT Short Term Goal 1 (Week 1): n/a d/t ELOS  Skilled Therapeutic Interventions/Progress Updates:    Pt received in w/c agreeable to therapy with no c/o pain. Pt required vc throughout session for redirection to task d/t excessive talking/cofabulation. Pt completed 7f w/c mobility with (S) and extra time/vc for safety provided during obstacle navigation. Discussion/Q&A with pt re L LE NWB status and wound vac, and emotional support provided re identity/body scheme changes. Pt encouaged to ask MD more questions re timing of removal/more specific indication. Pt requested to complete B UE ergometer, with 10 min completed at level 7.0 in order to increase B UE strength/endurance necessary for w/c propulsion and RW use. Pt returned to room and left in w/c with chair alarm set and all needs met.   Therapy Documentation Precautions:  Precautions Precautions: Fall Precaution Comments: Wound vac, L midfoot amputation Restrictions Weight Bearing Restrictions: Yes LUE Weight Bearing: Non weight bearing LLE Weight Bearing: Non weight bearing   Pain: Pain Assessment Pain Scale: 0-10 Pain Score: 0-No pain  See Function Navigator for Current Functional Status.   Therapy/Group: Individual Therapy  SCurtis Sites5/09/2017, 1:36 PM

## 2018-01-25 NOTE — Progress Notes (Signed)
Physical Therapy Session Note  Patient Details  Name: David Lane MRN: [id number] Date of Birth: 05-13-1946  Today's Date: 01/25/2018 PT Individual Time: 0900-1000 AND 1630-1700 PT Individual Time Calculation (min): 60 min AND 30 min   Short Term Goals: Week 1:  PT Short Term Goal 1 (Week 1): =LTGs due to ELOS  Skilled Therapeutic Interventions/Progress Updates:    Session 1: Pt seated in w/c upon PT arrival, agreeable to therapy tx and denies pain. Pt propelled w/c from room>gym x 200 ft with B UEs and supervision. Discussed d/c planning with pt in regards to stairs at home, pt has 2 steps in the front without rails and 2 steps in the back with only one rail on the L. Pt resistant to stair navigation techniques and states he will "just get people to help me in." Therapist educates pt on the importance of practice techniques in preparation for d/c, pt anxious but agreeable. Pt ascends/descends one 3 inch step with min assist and B handrails, verbal cues for techniques. Pt ascends/descends one 3 inch step with min assist and B UE support on single handrail, verbal cues for techniques. Pt propelled w/c to mat and transferred to mat squat pivot with supervision. Pt ambulated 2 x 30 ft with RW and supervision, verbal cues for techniques. Pt transferred sitting>supine on mat with suprevision, performed LE strengthening exercises 2 x 10 of each: SLR, sidelying hip abduction, heel slides, SAQ, sidelying hip extension. Pt performed UE strengthening exercises in supine, 2 x 10 each: 5# dowel chest press and 5# dowel bicep curls. Pt transferred back to w/c with supervision and propelled back to room with supervision, left seated in w/c with chair alarm set and needs in reach.    Session 2: Pt seated in w/c upon PT arrival, agreeable to therapy tx and denies pain. Pt propelled w/c from room>gym with supervision and B UEs. Pt states that he does not want a w/c for home b/c it will not fit. Therapist  educates pt that the w/c will be beneficial for community access/going to doctors appointments and the w/c can be used to bump up stairs with assist from his friends, pt declines all recommendations. Pt states he will sit on his porch from the car, scoot into his house and then pull him self up onto the recliner. Therapist attempted to simulate this by having pt sit on mat and then push himself up onto chair, required mod assist. Pt states "well it won't be like this at my house." Pt also does not want to attempt steps backwards with RW or using single handrail, other options this therapist tried to attempt with pt. Pt propelled w/c back to room and left seated with needs in reach and chair alarm set.   Therapy Documentation Precautions:  Precautions Precautions: Fall Precaution Comments: Wound vac, L midfoot amputation Restrictions Weight Bearing Restrictions: Yes LUE Weight Bearing: Non weight bearing LLE Weight Bearing: Non weight bearing   See Function Navigator for Current Functional Status.   Therapy/Group: Individual Therapy  Netta Corrigan, PT, DPT 01/25/2018, 7:51 AM

## 2018-01-25 NOTE — Progress Notes (Signed)
Recreational Therapy Session Note  Patient Details  Name: David Lane MRN: [id number] Date of Birth: 1946-04-29 Today's Date: 01/25/2018  Pain: no c/o Skilled Therapeutic Interventions/Progress Updates: Met with pt to discuss TR services.  Pt with excessive talking and difficult to keep on topic during session.  Pt stated anxiety about loss of control as he entered the hospital for amputation and the care he has received since.  Pt stated it was a lot for him to take in and thought that the process of healing, WBing, prothesis and ambulation would happen quicker, all of which add to his stress.  Allowed pt to share his concerns and identify strategies to assist with management of this.  Pt mostly encouraged by his ability to advocate for himself and direct his care.    SW note indicates, pt will discharge home 5/4.  Due to this full eval deferred.   Regine Christian 01/25/2018, 3:55 PM

## 2018-01-25 NOTE — Progress Notes (Signed)
Occupational Therapy Note  Patient Details  Name: David Lane MRN: [id number] Date of Birth: 1945/10/09  Today's Date: 01/25/2018 OT Individual Time: 5852-7782 OT Individual Time Calculation (min): 73 min   Upon entering the room, pt seated in wheelchair with no c/o pain this session. Pt initially very argumentative and declining OT interventions. Pt reports, " I'm not going to need a wheelchair when I get home." OT educating pt on equipment needs. Pt agreeable to demonstrating laundry tasks with OT providing verbal cues for set up for safety. Pt returned demonstrations with supervision for transfer and short distance of 10' for ambulating with RW. Pt having large LOB and grabbing onto washing machine to keep from bearing weight onto residual limb. OT assisted pt back to wheelchair and pt with limited awareness/insight. Pt then becomes very hyperverbal and requiring max verbal cues to redirect pt to task. OT providing therapeutic use of self and assisting pt back to room at end of session. Pt remaining in wheelchair with chair alarm activated and call bell within reach.    Gypsy Decant 01/25/2018, 4:44 PM

## 2018-01-25 NOTE — Progress Notes (Signed)
Social Work Patient ID: David Lane, male   DOB: 1945/11/21, 72 y.o.   MRN: [id number]  Met with pt to discuss team conference mod/i level goals and target discharge 5/4. Pt feels he will be able to do well once he is home. He does not feel he needs any equipment but the rolling walker, doesn't plan to go anywhere until he is WB on his foot. He is in agreement with follow up therapies. Will make appropriate referrals and work toward discharge Sat.

## 2018-01-25 NOTE — Patient Care Conference (Signed)
Inpatient RehabilitationTeam Conference and Plan of Care Update Date: 01/25/2018   Time: 11:15 AM    Patient Name: David Lane      Medical Record Number: [id number]  Date of Birth: 1946/06/22 Sex: Male         Room/Bed: 4W19C/4W19C-01 Payor Info: Payor: [place] / Plan: [employer] MEDICARE / Product Type: *No Product type* /    Admitting Diagnosis: L TMA  Admit Date/Time:  01/20/2018  6:02 PM Admission Comments: No comment available   Primary Diagnosis:  Functional gait disorder Principal Problem: Functional gait disorder  Patient Active Problem List   Diagnosis Date Noted  . Functional gait disorder 01/20/2018  . Acute blood loss anemia 01/20/2018  . Status post transmetatarsal amputation of left foot ([place]) 01/20/2018  . Cutaneous abscess of left foot   . Subacute osteomyelitis, left ankle and foot ([place])   . Sepsis due to cellulitis ([place]) 01/12/2018  . Foot ulcer due to secondary DM ([place]) 01/04/2018  . Status post coronary artery stent placement   . Acute ST elevation myocardial infarction (STEMI) involving left anterior descending (LAD) coronary artery ([place]) 12/20/2017  . 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 Wellness examination 11/25/2014  . Screening for prostate cancer 11/25/2014  . Pancreatic insufficiency 01/25/2013  . Ischemic cardiomyopathy, EF 50% by echo 08/01/13 07/19/2012  . Chronic combined systolic and diastolic CHF (congestive heart failure) ([place]) 07/14/2012  . Pulmonary edema, most likely due to diastolic dysfunction 24/40/1027  . CAD, 07/14/12- LAD/PDA DES   . ANXIETY 12/12/2009  . Hyperlipidemia 04/24/2007  . Essential hypertension 04/24/2007  . PANCREATITIS, HX OF 04/24/2007    Expected Discharge Date: Expected Discharge Date: 01/28/18  Team Members Present: Physician leading conference: Dr. Alysia Penna Social Worker Present: Ovidio Kin, LCSW Nurse Present: Dorien Chihuahua, RN PT  Present: Michaelene Song, PT OT Present: Willeen Cass, OT SLP Present: Weston Anna, SLP PPS Coordinator present : Daiva Nakayama, RN, CRRN     Current Status/Progress Goal Weekly Team Focus  Medical   very talkative in therapy, DM management much improved, Wound vac off on Friday  educate on new diabetic regimen  D/C planning   Bowel/Bladder   LBM 01/24/18 cont x 2  Remain cont x 2   Assess I/O each shift and prn    Swallow/Nutrition/ Hydration             ADL's   supervision  Mod I  dc planning, actviity tolerance, UB strengthening, transfers, NWB precautions   Mobility   supervision bed mobility, transfers and w/c propulsion. min assist gait with RW  Mod I  d/c planning, LEstrength, balance, gait, transfers   Communication             Safety/Cognition/ Behavioral Observations            Pain   No c/o pain at this time   Pain level 0 or less than 3 on a scale of 1-10  Assess pain each shift and prn    Skin                *See Care Plan and progress notes for long and short-term goals.     Barriers to Discharge  Current Status/Progress Possible Resolutions Date Resolved   Physician    Medical stability;Inaccessible home environment;Behavior     progressing toward goals  Cont rehab, work on stairs      Nursing  PT                    OT                  SLP                SW                Discharge Planning/Teaching Needs:  Home alone with sister and girlfriend coming in and out. He feels he can reach mod/i level goals.      Team Discussion:  Goals mod/i level, currently at supervision level and making progress toward his goals. Pain is managed. Pt concerned about his insulin at home and making sure it is properly controlled. Vac to DC Friday. Sister and girlfriend to be in and out at home.  Revisions to Treatment Plan:  DC 5/4    Continued Need for Acute Rehabilitation Level of Care: The patient requires daily medical management by a  physician with specialized training in physical medicine and rehabilitation for the following conditions: Daily direction of a multidisciplinary physical rehabilitation program to ensure safe treatment while eliciting the highest outcome that is of practical value to the patient.: Yes Daily medical management of patient stability for increased activity during participation in an intensive rehabilitation regime.: Yes Daily analysis of laboratory values and/or radiology reports with any subsequent need for medication adjustment of medical intervention for : Neurological problems;Diabetes problems  General Wearing, Gardiner Rhyme 01/25/2018, 1:13 PM

## 2018-01-25 NOTE — Progress Notes (Signed)
[facility]     PROGRESS NOTE    Subjective/Complaints:  Very talkative, states he drinks water between meals and urinates freq (fills up jug)   ROS: Patient denies fever, rash, sore throat, blurred vision, nausea, vomiting, diarrhea, cough, shortness of breath or chest pain, joint or back pain, headache, or mood change. .   Objective: Vital Signs: Blood pressure 92/68, pulse 79, temperature 97.6 F (36.4 C), temperature source Oral, resp. rate 18, height 6' (1.829 m), weight 86 kg (189 lb 9.5 oz), SpO2 100 %. No results found. Recent Labs    01/24/18 0509  WBC 9.0  HGB 9.9*  HCT 31.0*  PLT 396   Recent Labs    01/24/18 0509  NA 140  K 4.3  CL 103  GLUCOSE 145*  BUN 29*  CREATININE 1.29*  CALCIUM 8.6*   CBG (last 3)  Recent Labs    01/24/18 1642 01/24/18 2130 01/25/18 0625  GLUCAP 79 140* 83    Wt Readings from Last 3 Encounters:  01/24/18 86 kg (189 lb 9.5 oz)  01/13/18 88.9 kg (196 lb)  01/04/18 88.9 kg (196 lb)    Physical Exam:  Constitutional: No distress . Vital signs reviewed. HEENT: EOMI, oral membranes moist Neck: supple Cardiovascular: RRR without murmur. No JVD    Respiratory: CTA Bilaterally without wheezes or rales. Normal effort    GI: BS +, non-tender, non-distended  Musculoskeletal:  Left transmetatarsal amputation site with VAC in place.  Hard callus under right great toe. Left foot appropriately tender/intact. No edema Neurological: He isalertand oriented to person, place, and time. Nocranial nerve deficit.   Insight and awareness UE motor 5/5. LLE limited by pain but 3+ to 4/5. RLE 4/5. Decreased LT distally RLE-toes Skin: Skin iswarm.  Psychiatric: Pleasant    Assessment/Plan: 1.  Functional mobility deficits secondary to left transmetatarsal amputation which require 3+ hours per day of interdisciplinary therapy in a comprehensive inpatient rehab setting. Physiatrist is providing close  team supervision and 24 hour management of active medical problems listed below. Physiatrist and rehab team continue to assess barriers to discharge/monitor patient progress toward functional and medical goals.  Function:  Bathing Bathing position   Position: Wheelchair/chair at sink  Bathing parts Body parts bathed by patient: Right arm, Left arm, Chest, Abdomen, Left lower leg, Front perineal area, Buttocks, Right upper leg, Left upper leg, Right lower leg Body parts bathed by helper: Back  Bathing assist Assist Level: Supervision or verbal cues      Upper Body Dressing/Undressing Upper body dressing   What is the patient wearing?: Hospital gown         Button up shirt - Perfomed by patient: Thread/unthread right sleeve, Thread/unthread left sleeve, Pull shirt around back, Button/unbutton shirt      Upper body assist Assist Level: Supervision or verbal cues      Lower Body Dressing/Undressing Lower body dressing   What is the patient wearing?: Underwear Underwear - Performed by patient: Thread/unthread right underwear leg, Thread/unthread left underwear leg, Pull underwear up/down   Pants- Performed by patient: Thread/unthread right pants leg, Thread/unthread left pants leg, Pull pants up/down, Fasten/unfasten pants                        Lower body assist Assist for lower body dressing: Supervision or verbal cues, Touching or steadying assistance (Pt > 75%)(min gaurd A for balance)      Child psychotherapist  steps completed by patient: Adjust clothing prior to toileting, Performs perineal hygiene, Adjust clothing after toileting   Toileting Assistive Devices: Grab bar or rail  Toileting assist Assist level: Supervision or verbal cues   Transfers Chair/bed transfer   Chair/bed transfer method: Squat pivot Chair/bed transfer assist level: Touching or steadying assistance (Pt > 75%) Chair/bed transfer assistive device: Armrests, Environmental health practitioner     Max distance: 50' Assist level: Touching or steadying assistance (Pt > 75%)   Wheelchair   Type: Manual Max wheelchair distance: 150' Assist Level: Supervision or verbal cues  Cognition Comprehension Comprehension assist level: Follows basic conversation/direction with no assist  Expression Expression assist level: Expresses complex 90% of the time/cues < 10% of the time  Social Interaction Social Interaction assist level: Interacts appropriately 90% of the time - Needs monitoring or encouragement for participation or interaction.  Problem Solving Problem solving assist level: Solves complex 90% of the time/cues < 10% of the time  Memory Memory assist level: Complete Independence: No helper   Medical Problem List and Plan: 1.Functional deficitssecondary to left transmetatarsal amputation -Continue PT and OT, Team conference today please see physician documentation under team conference tab, met with team face-to-face to discuss problems,progress, and goals. Formulized individual treatment plan based on medical history, underlying problem and comorbidities. 2. DVT Prophylaxis/Anticoagulation: Pharmaceutical:Lovenox 3. Pain Management: denies curren tpain 4. Mood:LCSW to follow for evaluation and support 5. Neuropsych: This patientiscapable of making decisions on hisown behalf. 6. Skin/Wound Care:Continue Prevena VAC. Follow up with surgical team re: duration of use on Monday 7. Fluids/Electrolytes/Nutrition: .  Encourage p.o. intake.  Have added protein supplement.po fluid intake poor 720-1244m last BUN 29 enc po fluid  8. T1DMpoorly controlled:peripheral neuropathy Monitor BS ac/hs. Continue Lantus bid with meal coverage for better control.    -Patient appears to have awareness that he has to take better control of his blood sugars.  Dietary and diabetes education will be provided.  -Glucose control still suboptimal.  Increase morning  Lantus to 55 units in maintain evening Lantus at 5 units CBG (last 3)  Recent Labs    01/24/18 1642 01/24/18 2130 01/25/18 0625  GLUCAP 79 140* 83  controlled actually on low side 9. HTN: Monitor BP bid. Continue coreg bid Vitals:   01/24/18 1411 01/25/18 0553  BP: 109/81 92/68  Pulse: 84 79  Resp: 20 18  Temp: 97.6 F (36.4 C) 97.6 F (36.4 C)  SpO2: 100% 100%  Controlled 5/1 a little soft, no dizziness, will hold Coreg as well and monitor 10 Chronic combined systolic diastolic CHF: Heart healthy diet--compensated on coreg, Crestor and ASA. Off spironolactone and lisinopril at this time due to issue with hypotension.    Filed Weights   01/22/18 0454 01/23/18 0531 01/24/18 0440  Weight: 86 kg (189 lb 9.5 oz) 86.1 kg (189 lb 13.1 oz) 86 kg (189 lb 9.5 oz)   11. CAD s/p recent stent: On Brilinta, ASA and Crestor.  12 ABLA: Hemoglobin 9.8 .  Continue to follow 13. AKI: Due to vancomycin. Encourage fluid intake. Continue to monitor closely for recovery.Creat back to baseline, BUN mildly elevated enc po fluid   LOS (Days) 5 A FACE TO FACE EVALUATION WAS PERFORMED  ACharlett Blake MD 01/25/2018 8:37 AM

## 2018-01-26 ENCOUNTER — Inpatient Hospital Stay (HOSPITAL_COMMUNITY): Payer: Medicare Other

## 2018-01-26 ENCOUNTER — Inpatient Hospital Stay (HOSPITAL_COMMUNITY): Payer: Medicare Other | Admitting: Occupational Therapy

## 2018-01-26 ENCOUNTER — Inpatient Hospital Stay (HOSPITAL_COMMUNITY): Payer: Medicare Other | Admitting: Physical Therapy

## 2018-01-26 LAB — GLUCOSE, CAPILLARY
GLUCOSE-CAPILLARY: 125 mg/dL — AB (ref 65–99)
GLUCOSE-CAPILLARY: 78 mg/dL (ref 65–99)
Glucose-Capillary: 185 mg/dL — ABNORMAL HIGH (ref 65–99)
Glucose-Capillary: 98 mg/dL (ref 65–99)

## 2018-01-26 MED ORDER — CARVEDILOL 3.125 MG PO TABS
3.1250 mg | ORAL_TABLET | Freq: Two times a day (BID) | ORAL | Status: DC
  Administered 2018-01-26 – 2018-01-28 (×5): 3.125 mg via ORAL
  Filled 2018-01-26 (×4): qty 1

## 2018-01-26 MED ORDER — HYDROCODONE-ACETAMINOPHEN 5-325 MG PO TABS: 1.0000 | ORAL_TABLET | Freq: Four times a day (QID) | ORAL | Status: DC | PRN

## 2018-01-26 NOTE — Progress Notes (Signed)
Nutrition Follow-up  DOCUMENTATION CODES:   Not applicable  INTERVENTION:  Continue Premier Protein po BID, each supplement provides 160 kcal and 30 grams of protein.   Continue 30 ml Prostat po BID, each supplement provides 100 kcal and 15 grams of protein.   Encourage adequate PO intake.   NUTRITION DIAGNOSIS:   Increased nutrient needs related to chronic illness, wound healing as evidenced by estimated needs; ongoing  GOAL:   Patient will meet greater than or equal to 90% of their needs; met  MONITOR:   PO intake, Supplement acceptance, Labs, Weight trends, I & O's, Skin  REASON FOR ASSESSMENT:   Consult Diet education  ASSESSMENT:   72 year old male with history of coronary artery disease with ischemic cardiomyopathy and recent stent, hypertension, pancreatitis, type 1 diabetes mellitus, who was admitted on 01/12/2018 with left drainage with abscess, cellulitis and osteomyelitis.  He was started on IV antibiotics and underwent left transmetatarsal amputation by Dr. Sharol Given on 01/14/2018.   Meal completion has been mostly 100%. Pt did reports not like some of this food on his tray at lunch today, thus did not eat all of his meal. Pt reports appetite is fine with no other difficulties. Pt currently has Premier Protein and Prostat ordered for wound healing and has been consuming them. RD to continue with current orders. Pt reports, plans for [facility] removal tomorrow.   Diet Order:   Diet Order           Diet heart healthy/carb modified Room service appropriate? Yes; Fluid consistency: Thin  Diet effective now          EDUCATION NEEDS:   Education needs have been addressed  Skin:  Skin Assessment: Skin Integrity Issues: Skin Integrity Issues:: Incisions, Wound VAC Wound Vac: L leg Incisions: L leg  Last BM:  5/2  Height:   Ht Readings from Last 1 Encounters:  01/20/18 6' (1.829 m)    Weight:   Wt Readings from Last 1 Encounters:  01/26/18 180 lb 12.4 oz (82 kg)     Ideal Body Weight:  80.9 kg  BMI:  Body mass index is 24.52 kg/m.  Estimated Nutritional Needs:   Kcal:  2000-2200  Protein:  105-120 grams  Fluid:  2-2.2 L/day    Corrin Parker, MS, RD, LDN Pager # [phone] After hours/ weekend pager # [phone]

## 2018-01-26 NOTE — Discharge Summary (Signed)
Physician Discharge Summary  Patient ID: David Lane MRN: [id number] DOB/AGE: [birth year] 72 y.o.  Admit date: 01/20/2018 Discharge date: 01/28/2018  Discharge Diagnoses:  Principal Problem:   Functional gait disorder Active Problems:   Essential hypertension   Chronic combined systolic and diastolic CHF (congestive heart failure) (HCC)   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 (HCC)   Acute blood loss anemia   Status post transmetatarsal amputation of left foot (HCC)   Labile blood glucose   Hypoglycemia   Discharged Condition: stable   Significant Diagnostic Studies: N/A   Labs:  Basic Metabolic Panel: BMP Latest Ref Rng & Units 01/24/2018 01/21/2018 01/20/2018  Glucose 65 - 99 mg/dL 145(H) 114(H) 189(H)  BUN 6 - 20 mg/dL 29(H) 20 19  Creatinine 0.61 - 1.24 mg/dL 1.29(H) 1.20 1.28(H)  Sodium 135 - 145 mmol/L 140 137 137  Potassium 3.5 - 5.1 mmol/L 4.3 3.9 4.0  Chloride 101 - 111 mmol/L 103 102 105  CO2 22 - 32 mmol/L 28 28 25   Calcium 8.9 - 10.3 mg/dL 8.6(L) 8.3(L) 8.2(L)    CBC: CBC Latest Ref Rng & Units 01/24/2018 01/21/2018 01/15/2018  WBC 4.0 - 10.5 K/uL 9.0 7.9 10.1  Hemoglobin 13.0 - 17.0 g/dL 9.9(L) 9.8(L) 9.3(L)  Hematocrit 39.0 - 52.0 % 31.0(L) 31.0(L) 28.5(L)  Platelets 150 - 400 K/uL 396 480(H) 396    CBG: Recent Labs  Lab 01/27/18 1157 01/27/18 1630 01/27/18 1930 01/27/18 2010 01/28/18 0631  GLUCAP 116* 93 47* 75 146*    Brief HPI:   David Lane is a 72 year old male with history of CAD with ICM, recent stent, hypertension, type 1 diabetes mellitus; who was admitted on 01/12/2018 with drainage from left foot abscess cellulitis and osteomyelitis.  He was started on IV antibiotics and underwent left transmetatarsal amputation by Dr. Sharol Given on 01/15/2028.  He is to be nonweightbearing postop and VAC to stay in place for another week.  Therapies were ongoing and patient noted to have functional deficits in mobility and self-care start  tasks.  CIR was recommended for follow-up therapy   Hospital Course: David Lane was admitted to rehab 01/20/2018 for inpatient therapies to consist of PT and OT at least three hours five days a week. Past admission physiatrist, therapy team and rehab RN have worked together to provide customized collaborative inpatient rehab.  Patient's pain level has been controlled with as needed Tylenol.  Anxiety levels have improved with a good support.  Diabetes has been monitored with ECHO CBG checks with diabetic education provided.  Lantus was titrated to 55 units and with and 7 units NovoLog is being used on 3 times daily with good control.  No complaints of chest pain or shortness of breath noted with increase in activity level.    Acute blood loss anemia has been monitored. Follow-up CBC shows H&H is stable and reactive leukocytosis has resolved.  Follow-up labs done past admission shows AKI with serum creatinine at 1.29.  Prerenal VAC was removed on 5 3 and incision is noted to be clean dry and intact with sutures in place.  Dry dressing was applied and patient was advised to change dressing daily.  Patient has made great gains during his rehab stay and has progressed to modified independent level.  He will continue to receive follow-up home health PT and RN by [facility] past discharge   Rehab course: During patient's stay in rehab weekly team conferences were held to monitor patient's progress, set goals and discuss  barriers to discharge. At admission, patient required min assist with mobility and supervision with ADL tasks.  He  has had improvement in activity tolerance, balance, postural control as well as ability to compensate for deficits. He is able to complete ADL tasks at modified independent to supervision level. He is modified independent for transfers and is able to ambulate 70' with RW and increased time.     Disposition: Home   Diet: Diabetic/Heart Healthy  Special  Instructions: 1. No weight on right leg. 2. Check BS ac/hs --use novolog for meal coverage if BS > 80 and you are going to eat a meal.   Discharge Instructions    Ambulatory referral to Physical Medicine Rehab   Complete by:  As directed    1-2 weeks transitional care appt     Allergies as of 01/28/2018      Reactions   Lipitor [atorvastatin] Other (See Comments)   Muscle weakness   Metformin And Related Diarrhea   Severe diarrhea      Medication List    STOP taking these medications   feeding supplement (PRO-STAT SUGAR FREE 64) Liqd   HYDROcodone-acetaminophen 7.5-325 MG tablet Commonly known as:  NORCO   lisinopril 20 MG tablet Commonly known as:  PRINIVIL,ZESTRIL     TAKE these medications   acetaminophen 325 MG tablet Commonly known as:  TYLENOL Take 1-2 tablets (325-650 mg total) by mouth every 4 (four) hours as needed for mild pain. What changed:    when to take this  reasons to take this   aspirin 81 MG tablet Take 81 mg by mouth daily.   carvedilol 3.125 MG tablet Commonly known as:  COREG Take 1 tablet (3.125 mg total) by mouth 2 (two) times daily with a meal.   docusate sodium 100 MG capsule Commonly known as:  COLACE Take 1 capsule (100 mg total) by mouth 2 (two) times daily as needed for mild constipation. What changed:    when to take this  reasons to take this   glucose blood test strip Commonly known as:  ACCU-CHEK AVIVA 4 (four) times daily. DX code e11.9   insulin aspart 100 UNIT/ML injection Commonly known as:  novoLOG Inject 7 Units into the skin 3 (three) times daily with meals. What changed:  how much to take   insulin glargine 100 UNIT/ML injection Commonly known as:  LANTUS Inject 0.55 mLs (55 Units total) into the skin daily. What changed:    how much to take  Another medication with the same name was removed. Continue taking this medication, and follow the directions you see here.   Insulin Pen Needle 31G X 5 MM  Misc Commonly known as:  B-D UF III MINI PEN NEEDLES use to inject insulin daily   pantoprazole 40 MG tablet Commonly known as:  PROTONIX Take 1 tablet (40 mg total) by mouth daily.   polyethylene glycol packet Commonly known as:  MIRALAX / GLYCOLAX Take 17 g by mouth daily as needed for mild constipation.   protein supplement shake Liqd Commonly known as:  PREMIER PROTEIN Take 325 mLs (11 oz total) by mouth 3 (three) times daily with meals.   rosuvastatin 40 MG tablet Commonly known as:  CRESTOR Take 1 tablet (40 mg total) by mouth at bedtime.   ticagrelor 90 MG Tabs tablet Commonly known as:  BRILINTA Take 1 tablet (90 mg total) by mouth 2 (two) times daily.      Follow-up Information    Dorena Cookey,  MD Follow up on 02/06/2018.   Specialty:  Family Medicine Why:  Appointment @ 3:00 pm Contact information: [place] [address] [phone]        [name], MD .   Specialty:  Cardiology Contact information: [address] [place] [address] [phone]        Charlett Blake, MD Follow up.   Specialty:  Physical Medicine and Rehabilitation Why:  office will call you with follow up appointment Contact information: [place] [address] [phone]           Signed: Bary Leriche 01/30/2018, 12:58 PM

## 2018-01-26 NOTE — Progress Notes (Signed)
Physical Therapy Session Note  Patient Details  Name: David Lane MRN: [id number] Date of Birth: [birth year]  Today's Date: 01/26/2018 PT Individual Time: 2633-3545 PT Individual Time Calculation (min): 68 min   Short Term Goals: Week 1:  PT Short Term Goal 1 (Week 1): =LTGs due to ELOS  Skilled Therapeutic Interventions/Progress Updates:   Pt in w/c and agreeable to therapy, denies pain. Session focused on discharge planning, pt education, and locomotion in home environment. Extensively discussed option of using w/c when at home, educated pt on moments that a w/c would be safer and better for energy conservation including cooking, ADLs, and community mobility. Pt initially resistant to w/c use during this admission, although seems to be more willing to use w/c at d/c today. Pt concerned about using w/c in house, switched to 16x16 w/c for better maneuvering around the house. The 16x16 also fits pt more appropriately. He is agreeable to use the w/c at d/c to supplement short distance gait. Additionally spent time problem solving getting in/out of home w/ 2 small steps. Pt continues to insist that he will be fine to get into house w/ help of girlfriend's daughter's husband, whom he has already talked to about this. He plans to scoot or have friends assist in 3-musketeer style. Discussed bringing pt's family in tomorrow to trial this, pt stated they didn't need do. Pt continued to decline education and/or practicing with this therapist. Will continued to encourage practice for safety when getting in and out of home. Practiced gait in home-like environment requiring pt to maneuver cones and obstacles using RW w/ supervision. Returned to room and ended session in w/c, call bell within reach and all needs met. Chair alarm activated.   Therapy Documentation Precautions:  Precautions Precautions: Fall Precaution Comments: Wound vac, L midfoot amputation Restrictions Weight Bearing Restrictions:  Yes LUE Weight Bearing: Non weight bearing RLE Weight Bearing: Weight bearing as tolerated LLE Weight Bearing: Non weight bearing  See Function Navigator for Current Functional Status.   Therapy/Group: Individual Therapy  Carianna Lague K Arnette 01/26/2018, 11:54 AM

## 2018-01-26 NOTE — Progress Notes (Signed)
Patient insisted on sitting on the side of the bed;  Bed alarm on ; educated patient.

## 2018-01-26 NOTE — Progress Notes (Signed)
Occupational Therapy Note  Patient Details  Name: David Lane MRN: [id number] Date of Birth: 1946/03/15  Today's Date: 01/26/2018 OT Individual Time: 9166-0600 OT Individual Time Calculation (min): 43 min   Upon entering the room, pt seated in wheelchair awaiting therapist arrival. Pt with no c/o pain this session. Pt with several questions regarding prior OT session with kitchen set up to increase independence. Pt declined going to ADL apartment to "talk through" and practice kitchen mobility from wheelchair level. OT providing pt with paper handout regarding energy conservation techniques for self care and home management. Pt asking several appropriate questions and therapist providing examples regarding this topic. Pt remained in wheelchair at end of session with call bell and all needed items within reach upon exiting the room.    Gypsy Decant 01/26/2018, 4:37 PM

## 2018-01-26 NOTE — Progress Notes (Signed)
Occupational Therapy Session Note  Patient Details  Name: David Lane MRN: [id number] Date of Birth: Jan 25, 1946  Today's Date: 01/26/2018 OT Individual Time: 0850-1000 OT Individual Time Calculation (min): 70 min    Short Term Goals: Week 1:  OT Short Term Goal 1 (Week 1): n/a d/t ELOS  Skilled Therapeutic Interventions/Progress Updates:    Pt received sitting up in w/c at sink with nursing staff present. Pt very irritated with being interrupted during bathing and demanding that staff leave the room to complete peri-care. Pt reminded to not complete any transfers unattended d/t fall risk and pt indicated understanding, with chair alarm set. Pt allowed this OT to re-enter room and pt completed 150 ft of w/c mobility to ADL apt. Pt used RW to ambulate throughout kitchen and complete kitchen clean up task, reaching with his R UE to open/close cabinets and put away items of various weights with (S) level overall. Pt and this OT brainstormed through ways to complete IADL tasks with RW in the home, and OT provided recommendations to implement fall risk reduction and energy conservation strategies. Pt then propelled down to therapy gym and transferred to mat, with vc required for safety awareness and planning transfers/w/c set up before attempting to move. Pt completed standing level plumbers puzzle x2 for 3-4 min ea, with vc provided for B UE use and min A provided proximally to stabilize standing balance. Pt then sat on disc to challenge dynamic sitting balance and performed 2 sets of 15 ea abdominal crunch and rotation to increase core stability needed during dynamic reaching tasks sitting/standing. Pt returned to room and left with chair alarm set.   Therapy Documentation Precautions:  Precautions Precautions: Fall Precaution Comments: Wound vac, L midfoot amputation Restrictions Weight Bearing Restrictions: Yes LUE Weight Bearing: Non weight bearing RLE Weight Bearing: Weight bearing as  tolerated LLE Weight Bearing: Non weight bearing  Pain: Pain Assessment Pain Scale: 0-10 Pain Score: 0-No pain  See Function Navigator for Current Functional Status.   Therapy/Group: Individual Therapy  Curtis Sites 01/26/2018, 12:22 PM

## 2018-01-26 NOTE — Progress Notes (Signed)
[facility] PHYSICAL MEDICINE & REHABILITATION     PROGRESS NOTE    Subjective/Complaints:   Discussed the removal of wound vac today.  Some sweating last noc but no hypoglycemia  ROS: Patient denies fever, rash, sore throat, blurred vision, nausea, vomiting, diarrhea, cough, shortness of breath or chest pain, joint or back pain, headache, or mood change. .   Objective: Vital Signs: Blood pressure (!) 101/59, pulse 99, temperature 97.9 F (36.6 C), temperature source Oral, resp. rate 18, height 6' (1.829 m), weight 82 kg (180 lb 12.4 oz), SpO2 100 %. No results found. Recent Labs    01/24/18 0509  WBC 9.0  HGB 9.9*  HCT 31.0*  PLT 396   Recent Labs    01/24/18 0509  NA 140  K 4.3  CL 103  GLUCOSE 145*  BUN 29*  CREATININE 1.29*  CALCIUM 8.6*   CBG (last 3)  Recent Labs    01/25/18 1725 01/25/18 2059 01/26/18 0634  GLUCAP 86 92 125*    Wt Readings from Last 3 Encounters:  01/26/18 82 kg (180 lb 12.4 oz)  01/13/18 88.9 kg (196 lb)  01/04/18 88.9 kg (196 lb)    Physical Exam:  Constitutional: No distress . Vital signs reviewed. HEENT: EOMI, oral membranes moist Neck: supple Cardiovascular: RRR without murmur. No JVD    Respiratory: CTA Bilaterally without wheezes or rales. Normal effort    GI: BS +, non-tender, non-distended  Musculoskeletal:  Left transmetatarsal amputation site with VAC in place.  Hard callus under right great toe. Left foot appropriately tender/intact. No edema Neurological: He isalertand oriented to person, place, and time. Nocranial nerve deficit.   Insight and awareness UE motor 5/5. LLE limited by pain but 3+ to 4/5. RLE 4/5. Decreased LT distally RLE-toes Skin: Skin iswarm.  Psychiatric: Pleasant    Assessment/Plan: 1.  Functional mobility deficits secondary to left transmetatarsal amputation which require 3+ hours per day of interdisciplinary therapy in a comprehensive inpatient rehab setting. Physiatrist is providing  close team supervision and 24 hour management of active medical problems listed below. Physiatrist and rehab team continue to assess barriers to discharge/monitor patient progress toward functional and medical goals.  Function:  Bathing Bathing position   Position: Wheelchair/chair at sink  Bathing parts Body parts bathed by patient: Right arm, Left arm, Chest, Abdomen, Left lower leg, Front perineal area, Buttocks, Right upper leg, Left upper leg, Right lower leg Body parts bathed by helper: Back  Bathing assist Assist Level: Supervision or verbal cues      Upper Body Dressing/Undressing Upper body dressing   What is the patient wearing?: Hospital gown         Button up shirt - Perfomed by patient: Thread/unthread right sleeve, Thread/unthread left sleeve, Pull shirt around back, Button/unbutton shirt      Upper body assist Assist Level: Supervision or verbal cues      Lower Body Dressing/Undressing Lower body dressing   What is the patient wearing?: Underwear Underwear - Performed by patient: Thread/unthread right underwear leg, Thread/unthread left underwear leg, Pull underwear up/down   Pants- Performed by patient: Thread/unthread right pants leg, Thread/unthread left pants leg, Pull pants up/down, Fasten/unfasten pants                        Lower body assist Assist for lower body dressing: Supervision or verbal cues, Touching or steadying assistance (Pt > 75%)(min gaurd A for balance)      Toileting Toileting  Toileting steps completed by patient: Adjust clothing prior to toileting, Performs perineal hygiene, Adjust clothing after toileting   Toileting Assistive Devices: Grab bar or rail  Toileting assist Assist level: Supervision or verbal cues   Transfers Chair/bed transfer   Chair/bed transfer method: Squat pivot, Stand pivot Chair/bed transfer assist level: Supervision or verbal cues Chair/bed transfer assistive device: Armrests, Environmental health practitioner     Max distance: 10' Assist level: Supervision or verbal cues   Wheelchair   Type: Manual Max wheelchair distance: 150' Assist Level: Supervision or verbal cues  Cognition Comprehension Comprehension assist level: Follows basic conversation/direction with no assist  Expression Expression assist level: Expresses complex 90% of the time/cues < 10% of the time  Social Interaction Social Interaction assist level: Interacts appropriately 90% of the time - Needs monitoring or encouragement for participation or interaction.  Problem Solving Problem solving assist level: Solves basic 90% of the time/requires cueing < 10% of the time  Memory Memory assist level: Complete Independence: No helper   Medical Problem List and Plan: 1.Functional deficitssecondary to left transmetatarsal amputation -Continue PT and OT, discussed 5/4 d/c date     2. DVT Prophylaxis/Anticoagulation: Pharmaceutical:Lovenox 3. Pain Management: denies curren tpain 4. Mood:LCSW to follow for evaluation and support 5. Neuropsych: This patientiscapable of making decisions on hisown behalf. 6. Skin/Wound Care:Continue Prevena VAC. Follow up with surgical team re: duration of use on Monday 7. Fluids/Electrolytes/Nutrition: .  Encourage p.o. intake.  Have added protein supplement.po fluid intake poor 720-126ml last BUN 29 enc po fluid  8. T1DM peripheral neuropathy Monitor BS ac/hs. Continue Lantus bid with meal coverage for better control.    -Patient appears to have awareness that he has to take better control of his blood sugars.  Dietary and diabetes education will be provided.  -Glucose control still suboptimal.  Increase morning Lantus to 55 units in maintain evening Lantus at 5 units CBG (last 3)  Recent Labs    01/25/18 1725 01/25/18 2059 01/26/18 0634  GLUCAP 86 92 125*  controlled 5/2 9. HTN: Monitor BP bid. Continue coreg bid Vitals:   01/25/18 1346 01/26/18  0517  BP: 105/67 (!) 101/59  Pulse: 87 99  Resp: 19 18  Temp: 98.3 F (36.8 C) 97.9 F (36.6 C)  SpO2: 100% 100%  Controlled 5/2 a little soft,but no dizziness, will hold Coreg  HR creeping up will resume low dose10 Chronic combined systolic diastolic CHF: Heart healthy diet--compensated on coreg, Crestor and ASA. Off spironolactone and lisinopril at this time due to issue with hypotension.    Filed Weights   01/24/18 0440 01/25/18 0553 01/26/18 0517  Weight: 86 kg (189 lb 9.5 oz) 82.1 kg (181 lb) 82 kg (180 lb 12.4 oz)   11. CAD s/p recent stent: On Brilinta, ASA and Crestor.  12 ABLA: Hemoglobin 9.8 .  Continue to follow 13. AKI: Due to vancomycin. Encourage fluid intake. Continue to monitor closely for recovery.Creat back to baseline, BUN mildly elevated enc po fluid   LOS (Days) 6 A FACE TO FACE EVALUATION WAS PERFORMED  Charlett Blake, MD 01/26/2018 8:26 AM

## 2018-01-27 ENCOUNTER — Inpatient Hospital Stay (HOSPITAL_COMMUNITY): Payer: Medicare Other

## 2018-01-27 ENCOUNTER — Inpatient Hospital Stay (HOSPITAL_COMMUNITY): Payer: Medicare Other | Admitting: Physical Therapy

## 2018-01-27 LAB — GLUCOSE, CAPILLARY
Glucose-Capillary: 130 mg/dL — ABNORMAL HIGH (ref 65–99)
Glucose-Capillary: 146 mg/dL — ABNORMAL HIGH (ref 65–99)
Glucose-Capillary: 116 mg/dL — ABNORMAL HIGH (ref 65–99)
Glucose-Capillary: 93 mg/dL (ref 65–99)
Glucose-Capillary: 47 mg/dL — ABNORMAL LOW (ref 65–99)
Glucose-Capillary: 75 mg/dL (ref 65–99)

## 2018-01-27 MED ORDER — INSULIN ASPART 100 UNIT/ML ~~LOC~~ SOLN: 7.0000 [IU] | Freq: Three times a day (TID) | SUBCUTANEOUS | 0 refills | Status: DC

## 2018-01-27 MED ORDER — PANTOPRAZOLE SODIUM 40 MG PO TBEC: 40.0000 mg | DELAYED_RELEASE_TABLET | Freq: Every day | ORAL | 0 refills | Status: DC

## 2018-01-27 MED ORDER — DOCUSATE SODIUM 100 MG PO CAPS: 100.0000 mg | ORAL_CAPSULE | Freq: Two times a day (BID) | ORAL | 0 refills | Status: DC | PRN

## 2018-01-27 MED ORDER — INSULIN GLARGINE 100 UNIT/ML ~~LOC~~ SOLN: 55.0000 [IU] | Freq: Every day | SUBCUTANEOUS | 11 refills | Status: DC

## 2018-01-27 MED ORDER — INSULIN ASPART 100 UNIT/ML ~~LOC~~ SOLN: 7.0000 [IU] | Freq: Three times a day (TID) | SUBCUTANEOUS | 11 refills | Status: DC

## 2018-01-27 MED ORDER — PREMIER PROTEIN SHAKE: 11.0000 [oz_av] | Freq: Three times a day (TID) | ORAL | 0 refills | Status: DC

## 2018-01-27 MED ORDER — ACETAMINOPHEN 325 MG PO TABS: 325.0000 mg | ORAL_TABLET | ORAL | Status: DC | PRN

## 2018-01-27 NOTE — Progress Notes (Addendum)
[facility] PHYSICAL MEDICINE & REHABILITATION     PROGRESS NOTE    Subjective/Complaints:  No issues overnite, concerned about wound vac  ROS: Patient denies fever, rash, sore throat, blurred vision, nausea, vomiting, diarrhea, cough, shortness of breath or chest pain, joint or back pain, headache, or mood change. .   Objective: Vital Signs: Blood pressure 104/72, pulse 92, temperature 97.7 F (36.5 C), temperature source Oral, resp. rate 16, height 6' (1.829 m), weight 65.3 kg (143 lb 15.4 oz), SpO2 99 %. No results found. No results for input(s): WBC, HGB, HCT, PLT in the last 72 hours. No results for input(s): NA, K, CL, GLUCOSE, BUN, CREATININE, CALCIUM in the last 72 hours.  Invalid input(s): CO CBG (last 3)  Recent Labs    01/26/18 1645 01/26/18 2118 01/27/18 0753  GLUCAP 98 78 130*    Wt Readings from Last 3 Encounters:  01/27/18 65.3 kg (143 lb 15.4 oz)  01/13/18 88.9 kg (196 lb)  01/04/18 88.9 kg (196 lb)    Physical Exam:  Constitutional: No distress . Vital signs reviewed. HEENT: EOMI, oral membranes moist Neck: supple Cardiovascular: RRR without murmur. No JVD    Respiratory: CTA Bilaterally without wheezes or rales. Normal effort    GI: BS +, non-tender, non-distended  Musculoskeletal:  Left transmetatarsal amputation site with VAC in place.  Hard callus under right great toe. Left foot appropriately tender/intact. No edema Neurological: He isalertand oriented to person, place, and time. Nocranial nerve deficit.   Insight and awareness UE motor 5/5. LLE limited by pain but 3+ to 4/5. RLE 4/5. Decreased LT distally RLE-toes Skin: Skin iswarm. Maceration of distal wound, no erythema, small open area mid incision.  Photo taken in media Psychiatric: Pleasant    Assessment/Plan: 1.  Functional mobility deficits secondary to left transmetatarsal amputation which require 3+ hours per day of interdisciplinary therapy in a comprehensive inpatient rehab  setting. Physiatrist is providing close team supervision and 24 hour management of active medical problems listed below. Physiatrist and rehab team continue to assess barriers to discharge/monitor patient progress toward functional and medical goals.  Function:  Bathing Bathing position   Position: Wheelchair/chair at sink  Bathing parts Body parts bathed by patient: Right arm, Left arm, Chest, Abdomen, Left lower leg, Front perineal area, Buttocks, Right upper leg, Left upper leg, Right lower leg Body parts bathed by helper: Back  Bathing assist Assist Level: More than reasonable time      Upper Body Dressing/Undressing Upper body dressing   What is the patient wearing?: Hospital gown         Button up shirt - Perfomed by patient: Thread/unthread right sleeve, Thread/unthread left sleeve, Pull shirt around back, Button/unbutton shirt      Upper body assist Assist Level: More than reasonable time      Lower Body Dressing/Undressing Lower body dressing   What is the patient wearing?: Underwear Underwear - Performed by patient: Thread/unthread right underwear leg, Thread/unthread left underwear leg, Pull underwear up/down   Pants- Performed by patient: Thread/unthread right pants leg, Thread/unthread left pants leg, Pull pants up/down, Fasten/unfasten pants                        Lower body assist Assist for lower body dressing: More than reasonable time      Toileting Toileting   Toileting steps completed by patient: Adjust clothing prior to toileting, Performs perineal hygiene, Adjust clothing after toileting   Toileting Assistive Devices: Grab  bar or rail  Toileting assist Assist level: More than reasonable time   Transfers Chair/bed transfer   Chair/bed transfer method: Stand pivot Chair/bed transfer assist level: No Help, no cues, assistive device, takes more than a reasonable amount of time Chair/bed transfer assistive device: Armrests, Environmental health practitioner Ambulation activity did not occur: N/A   Max distance: 25' Assist level: Supervision or verbal cues   Wheelchair   Type: Manual Max wheelchair distance: 150 Assist Level: No help, No cues, assistive device, takes more than reasonable amount of time  Cognition Comprehension Comprehension assist level: Follows basic conversation/direction with no assist  Expression Expression assist level: Expresses complex 90% of the time/cues < 10% of the time  Social Interaction Social Interaction assist level: Interacts appropriately 90% of the time - Needs monitoring or encouragement for participation or interaction.  Problem Solving Problem solving assist level: Solves basic 90% of the time/requires cueing < 10% of the time  Memory Memory assist level: Complete Independence: No helper   Medical Problem List and Plan: 1.Functional deficitssecondary to left transmetatarsal amputation -Continue PT and OT, discussed 5/4 d/c date     2. DVT Prophylaxis/Anticoagulation: Pharmaceutical:Lovenox 3. Pain Management: denies curren tpain 4. Mood:LCSW to follow for evaluation and support 5. Neuropsych: This patientiscapable of making decisions on hisown behalf. 6. Skin/Wound Care:Continue Prevena VAC. Follow up with surgical team re: duration of use on Monday 7. Fluids/Electrolytes/Nutrition: .  Encourage p.o. intake.  Have added protein supplement.po fluid intake poor 720-1240ml last BUN 29 enc po fluid  8. T1DM peripheral neuropathy Monitor BS ac/hs. Continue Lantus bid with meal coverage for better control.    -Patient appears to have awareness that he has to take better control of his blood sugars.  Dietary and diabetes education will be provided.  -Glucose control still suboptimal.  Increase morning Lantus to 55 units in maintain evening Lantus at 5 units CBG (last 3)  Recent Labs    01/26/18 1645 01/26/18 2118 01/27/18 0753  GLUCAP 98 78 130*   controlled 5/3 9. HTN: Monitor BP bid. Continue coreg bid Vitals:   01/26/18 1439 01/27/18 0141  BP: 100/66 104/72  Pulse: 93 92  Resp: 18 16  Temp: 98.6 F (37 C) 97.7 F (36.5 C)  SpO2: 100% 99%  Controlled 5/2 a little soft,but no dizziness, will hold Coreg  HR creeping up will resume low dose10 Chronic combined systolic diastolic CHF: Heart healthy diet--compensated on coreg, Crestor and ASA. Off spironolactone and lisinopril at this time due to issue with hypotension.    Filed Weights   01/25/18 0553 01/26/18 0517 01/27/18 0500  Weight: 82.1 kg (181 lb) 82 kg (180 lb 12.4 oz) 65.3 kg (143 lb 15.4 oz)   11. CAD s/p recent stent: On Brilinta, ASA and Crestor.  12 ABLA: Hemoglobin 9.8 .  Continue to follow 13. AKI: Due to vancomycin. Encourage fluid intake. Continue to monitor closely for recovery.Creat back to baseline, BUN mildly elevated enc po fluid   LOS (Days) 7 A FACE TO FACE EVALUATION WAS PERFORMED  Charlett Blake, MD 01/27/2018 8:27 AM

## 2018-01-27 NOTE — Progress Notes (Signed)
Occupational Therapy Discharge Summary  Patient Details  Name: David Lane MRN: [id number] Date of Birth: [birth year]  Today's Date: 01/27/2018 OT Individual Time: 0700-0810 OT Individual Time Calculation (min): 70 min   Patient has met 9 of 10 long term goals due to improved activity tolerance, improved balance, postural control, ability to compensate for deficits, improved awareness and improved coordination.  Patient to discharge at overall Modified Independent/ supervision level.  Pt has made great progress toward his goals, completing ADL transfers with mod I. Pt demo good carryover of therapy concepts and adheres to NWB status of L LE throughout ADL tasks and transfers.   Reasons goals not met: Pt's dynamic standing balance is still limited by NWB L LE, pt will d/c at (S) level.   Recommendation:  Patient will benefit from ongoing skilled OT services in home health setting to continue to advance functional skills in the area of BADL.  Equipment: w/c, tub transfer bench, RW  Reasons for discharge: treatment goals met and discharge from hospital  Patient/family agrees with progress made and goals achieved: Yes    OT Treatment Note: Pt in bed agreeable to therapy, easily awoken to vc. Pt reports no pain. Discussion with pt re reengagment in ADL/IADL tasks, fall risk prevention at home, diabetic foot care, home set up, and energy conservation techniques. Pt provided with handouts re home safety and diabetic foot care. Pt reporting that he will not complete any tub transfers d/t feeling unsafe with living alone. Pt took excessive time to eat breakfast, with education re d/c planning provided throughout. Vc required for redirection to task d/t verbose nature of pt. Pt performed toilet transfer with mod I. Pt propelled w/c to ADL suite with mod I, 13f to/from room. Pt completed furniture transfer with mod I on/off couch, with edu provided re w/c management to maximize safety. Pt  transferred in/out of elevated bed and completed all bed mobility with mod I. Pt demo good adherence to NWB status throughout session. Pt returned to room and all needs met.     OT Discharge Precautions/Restrictions  Precautions Precautions: Fall Precaution Comments: L midfoot amputation Restrictions Weight Bearing Restrictions: Yes RLE Weight Bearing: Weight bearing as tolerated LLE Weight Bearing: Non weight bearing   Pain Pain Assessment Pain Scale: 0-10 Pain Score: 0-No pain ADL ADL ADL Comments: See functional navigator Vision Baseline Vision/History: Wears glasses Wears Glasses: Reading only Patient Visual Report: No change from baseline Vision Assessment?: No apparent visual deficits Perception  Perception: Within Functional Limits Praxis Praxis: Intact Cognition Overall Cognitive Status: Within Functional Limits for tasks assessed Arousal/Alertness: Awake/alert Orientation Level: Oriented X4 Attention: Sustained Sustained Attention: Appears intact Memory: Appears intact Awareness: Appears intact Problem Solving: Appears intact Safety/Judgment: Appears intact Comments: verbose Sensation Sensation Light Touch: Impaired by gross assessment Additional Comments: B LE impaired sensation  Coordination Gross Motor Movements are Fluid and Coordinated: No Coordination and Movement Description: Coordination impaired 2/2 to NWB status of LLE Finger Nose Finger Test: [facility]otor  Motor Motor: Within Functional Limits Mobility  Bed Mobility Bed Mobility: Rolling Right;Rolling Left;Supine to Sit;Sit to Supine Rolling Right: 6: Modified independent (Device/Increase time) Rolling Left: 6: Modified independent (Device/Increase time) Supine to Sit: 6: Modified independent (Device/Increase time) Sit to Supine: 6: Modified independent (Device/Increase time) Transfers Transfers: Sit to Stand;Stand to Sit Sit to Stand: 6: Modified independent (Device/Increase time) Stand  to Sit: 6: Modified independent (Device/Increase time)  Trunk/Postural Assessment  Cervical Assessment Cervical Assessment: Within Functional Limits Thoracic Assessment Thoracic  Assessment: Within Functional Limits Lumbar Assessment Lumbar Assessment: Within Functional Limits Postural Control Postural Control: Within Functional Limits  Balance Balance Balance Assessed: No Static Sitting Balance Static Sitting - Balance Support: No upper extremity supported;Feet supported Static Sitting - Level of Assistance: 6: Modified independent (Device/Increase time) Static Sitting - Comment/# of Minutes: 10 Dynamic Sitting Balance Dynamic Sitting - Balance Support: No upper extremity supported;Feet supported Dynamic Sitting - Level of Assistance: 6: Modified independent (Device/Increase time) Dynamic Sitting - Balance Activities: Reaching for objects Static Standing Balance Static Standing - Balance Support: Bilateral upper extremity supported;During functional activity Static Standing - Level of Assistance: 6: Modified independent (Device/Increase time) Dynamic Standing Balance Dynamic Standing - Balance Support: Right upper extremity supported;During functional activity;Left upper extremity supported Dynamic Standing - Level of Assistance: 5: Stand by assistance Dynamic Standing - Balance Activities: Reaching for objects Dynamic Standing - Comments: Pt requires min A when removing B UE from RW to correct dynamic standing balance Extremity/Trunk Assessment RUE Assessment RUE Assessment: Within Functional Limits LUE Assessment LUE Assessment: Within Functional Limits   See Function Navigator for Current Functional Status.  Curtis Sites 01/27/2018, 8:13 AM

## 2018-01-27 NOTE — Discharge Instructions (Signed)
Inpatient Rehab Discharge Instructions  David Lane Discharge date and time: 01/28/18   Activities/Precautions/ Functional Status: Activity: no lifting, driving, or strenuous exercise till cleared by MD. No weight on left leg.  Diet: cardiac diet and diabetic diet Wound Care: Wash area with antibacterial soap and water. Pat dry and apply dry dressing daily.    Functional status:  ___ No restrictions     ___ Walk up steps independently ___ 24/7 supervision/assistance   ___ Walk up steps with assistance _X__ Intermittent supervision/assistance  ___ Bathe/dress independently _X__ Walk with walker     _X__ Bathe/dress with assistance ___ Walk Independently    ___ Shower independently ___ Walk with assistance    ___ Shower with assistance _X__ No alcohol     ___ Return to work/school ________  Special Instructions: 1. Absolutely no weight on left foot. 2. Monitor blood sugars before meals and at bedtime and follow up with primary for adjustment in medications.  3. Continue to use Novolog for meal coverage or sliding scale as you were doing prior to admission/   [place]:    Home Health:   [facility]   Date of last service:01/28/2018  Medical Equipment/Items [place]   [phone]   GENERAL COMMUNITY RESOURCES FOR PATIENT/FAMILY: Support [place] 7:00-8:30 PM QUESTIONS CONTACT ROBIN [phone]   My questions have been answered and I understand these instructions. I will adhere to these goals and the provided educational materials after my discharge from the hospital.  Patient/Caregiver Signature _______________________________ Date __________  Clinician Signature _______________________________________ Date __________  Please bring this form and your medication list with  you to all your follow-up doctor's appointments.

## 2018-01-27 NOTE — Progress Notes (Signed)
Physical Therapy Discharge Summary  Patient Details  Name: David Lane MRN: [id number] Date of Birth: 1946-01-03  Today's Date: 01/27/2018 PT Individual Time: 1100-1150 AND 1510-1555 PT Individual Time Calculation (min): 50 min AND 45 min  Session 1:  Pt in w/c and agreeable to therapy, denies pain. Performed functional mobility as detailed below. Pt agreeable to practice stairs only to assess how his strength has improved, declined attempts to practice as per home set-up. Provided w/ written handout and explained purpose of desensitization on residual limb surface in anticipation of prosthesis use for recent midfoot amputation. Pt verbalized understanding. Performed arm ergometer 5 min x2 rest @ level 1.0 for UE strengthening and endurance. Returned to room and ended session in w/c, call bell within reach and all needs met.   Session 2:  Pt in w/c and agreeable to therapy, denies pain. Session focused on functional balance and UE strengthening. Pt self-propelled w/c to/from gym. Performed dynamic balance tasks in standing requiring pt to use only 1 UE for support on RW, mimicking home set-up. Performed UE strengthening exercises including 4# dowel shoulder press, 4# arm flexion, and trunk rotation w/ 2kg ball. Returned to room and ended session in w/c, call bell within reach and all needs met.   Patient has met 6 of 7 long term goals due to improved activity tolerance, improved balance, increased strength, ability to compensate for deficits and functional use of  right lower extremity.  Patient to discharge at an ambulatory level Modified Independent.   Patient's care partner is independent to provide the necessary physical assistance within the home at discharge. Girlfriend and sister planning to assist PRN, multiple other family members available to assist PRN as well. Family members have not been present for family education despite requests from this therapist. Additionally, pt declined  having family members be present for family education specifically to get in and out of house w/ pt's 2 steps. Pt has insisted that they will be able to do it and will not need education from this therapist.   Reasons goals not met: Pt still requires use of both handrails for safety on stairs. Pt plans to use support from family members to compensate for only 1 rail at home. Pt declined practicing this despite multiple requests.   Recommendation:  Patient will benefit from ongoing skilled PT services in home health setting to continue to advance safe functional mobility, address ongoing impairments in LE strengthening, functional mobility, LE ROM, and functional balance, and minimize fall risk.  Equipment: w/c, RW  Reasons for discharge: treatment goals met and discharge from hospital  Patient/family agrees with progress made and goals achieved: Yes  PT Discharge Precautions/Restrictions Precautions Precautions: Fall Precaution Comments: L midfoot amputation Restrictions Weight Bearing Restrictions: Yes LLE Weight Bearing: Non weight bearing Pain Pain Assessment Pain Scale: 0-10 Pain Score: 0-No pain Vision/Perception  Perception Perception: Within Functional Limits Praxis Praxis: Intact  Cognition Overall Cognitive Status: Within Functional Limits for tasks assessed Arousal/Alertness: Awake/alert Orientation Level: Oriented X4 Sustained Attention: Appears intact Memory: Appears intact Awareness: Appears intact Problem Solving: Appears intact Safety/Judgment: Appears intact Comments: verbose Sensation Sensation Light Touch: Impaired by gross assessment(Absent sensation in L foot at midfoot amputation, otherwise appears intact) Proprioception: Appears Intact Coordination Gross Motor Movements are Fluid and Coordinated: No Fine Motor Movements are Fluid and Coordinated: Yes Coordination and Movement Description: Coordination impaired 2/2 to NWB status of LLE Motor   Motor Motor: Within Functional Limits  Mobility Bed Mobility Bed Mobility: Rolling Right;Rolling  Left;Supine to Sit;Sit to Supine Rolling Right: 6: Modified independent (Device/Increase time) Rolling Left: 6: Modified independent (Device/Increase time) Supine to Sit: 6: Modified independent (Device/Increase time) Sit to Supine: 6: Modified independent (Device/Increase time) Transfers Transfers: Yes Sit to Stand: 6: Modified independent (Device/Increase time) Stand to Sit: 6: Modified independent (Device/Increase time) Stand Pivot Transfers: 6: Modified independent (Device/Increase time) Locomotion  Ambulation Ambulation: Yes Ambulation/Gait Assistance: 6: Modified independent (Device/Increase time) Ambulation Distance (Feet): 60 Feet Assistive device: Rolling walker Gait Gait: Yes Gait Pattern: Impaired Gait Pattern: Antalgic Gait velocity: decreased Stairs / Additional Locomotion Stairs: Yes Stairs Assistance: 5: Supervision Stairs Assistance Details: Verbal cues for precautions/safety;Verbal cues for technique Stair Management Technique: Two rails Number of Stairs: 4 Height of Stairs: 6 Wheelchair Mobility Wheelchair Mobility: Yes Wheelchair Assistance: 6: Modified independent (Device/Increase time) Environmental health practitioner: Both upper extremities Wheelchair Parts Management: Independent Distance: 150'  Trunk/Postural Assessment  Cervical Assessment Cervical Assessment: Within Functional Limits Thoracic Assessment Thoracic Assessment: Within Functional Limits Lumbar Assessment Lumbar Assessment: Within Functional Limits Postural Control Postural Control: Within Functional Limits  Balance Static Sitting Balance Static Sitting - Balance Support: No upper extremity supported;Feet supported Static Sitting - Level of Assistance: 6: Modified independent (Device/Increase time) Dynamic Sitting Balance Dynamic Sitting - Balance Support: No upper extremity supported;Feet  supported Dynamic Sitting - Level of Assistance: 6: Modified independent (Device/Increase time) Static Standing Balance Static Standing - Balance Support: Bilateral upper extremity supported;During functional activity Static Standing - Level of Assistance: 6: Modified independent (Device/Increase time) Dynamic Standing Balance Dynamic Standing - Balance Support: Right upper extremity supported;During functional activity;Left upper extremity supported Extremity Assessment  RLE Assessment RLE Assessment: Within Functional Limits LLE Assessment LLE Assessment: Exceptions to WFL(WFL, unable to assess strength of L PF/DF 2/2 midfoot amputation, able to actively perform PF and DF)   See Function Navigator for Current Functional Status.  Rickard Kennerly K Arnette 01/27/2018, 11:52 AM

## 2018-01-27 NOTE — Progress Notes (Signed)
Social Work  Discharge Note  The overall goal for the admission was met for: DC-SAT 5/4  Discharge location: Yes-HOME ALONE WITH INTERMITTENT ASSIST FROM LINDA-GIRLFRIEND AND SISTER  Length of Stay: Yes-8 DAYS  Discharge activity level: Yes-MOD/I LEVEL  Home/community participation: Yes  Services provided included: MD, RD, PT, OT, RN, CM, Pharmacy, Neuropsych and SW  Financial Services: Private Insurance: [employer]  Follow-up services arranged: Home Health: [facility]-PT & RN, DME: [place], REFUSED [facility]IN[facility] AND [facility] and Patient/Family has no preference for HH/DME agencies  Comments (or additional information):PT DID WELL AND REACHED MOD/I LEVEL GOALS-LINDA AND HIS SISTER TO [place]. PT REFUSED FAMILY EDUCATION WITH THEM FELT IT WAS UNNECESSARY  Patient/Family verbalized understanding of follow-up arrangements: Yes  Individual responsible for coordination of the follow-up plan: SELF & LINDA-GIRLFRIEND  Confirmed correct DME delivered: Elease Hashimoto 01/27/2018    Elease Hashimoto

## 2018-01-28 DIAGNOSIS — R7309 Other abnormal glucose: Secondary | ICD-10-CM

## 2018-01-28 DIAGNOSIS — E162 Hypoglycemia, unspecified: Secondary | ICD-10-CM

## 2018-01-28 LAB — GLUCOSE, CAPILLARY: GLUCOSE-CAPILLARY: 146 mg/dL — AB (ref 65–99)

## 2018-01-28 NOTE — Progress Notes (Signed)
Pt in room with family, dressing to left foot changed, in good spirits ready to go home.

## 2018-01-28 NOTE — Progress Notes (Signed)
Hypoglycemic Event  CBG: 47  Treatment: 15 GM carbohydrate snack  Symptoms: Sweaty  Follow-up CBG: Time:2010 CBG Result:75  Possible Reasons for Event: Medication regimen:   Comments/MD notified:Patient feels better no further hypoglycemic episodes     David Lane  [name]

## 2018-01-28 NOTE — Plan of Care (Signed)
  Problem: Education: Goal: Knowledge of the prescribed therapeutic regimen will improve Description Increase in knowledge of prescribed regimen will improve with min assist  Outcome: Progressing Goal: Ability to verbalize activity precautions or restrictions will improve Description Verbalize and adhere to activity precautions or restrictions with min assist  Outcome: Progressing Goal: Understanding of discharge needs will improve Description Understanding of discharge plan and needs will improve with min assist  Outcome: Progressing   Problem: Activity: Goal: Ability to perform//tolerate increased activity and mobilize with assistive devices will improve Description Able to mobilize with min assist using assistive device  Outcome: Progressing   Problem: Clinical Measurements: Goal: Postoperative complications will be avoided or minimized Description Prevent post op complications with min assist  Outcome: Progressing   Problem: Self-Care: Goal: Ability to meet self-care needs will improve Description Able to meet self-care needs with min assist.  Outcome: Progressing   Problem: Self-Concept: Goal: Ability to maintain and perform role responsibilities to the fullest extent possible will improve Description Perform role responsibilities to the fullest with min assist  Outcome: Progressing   Problem: Pain Management: Goal: Pain level will decrease with appropriate interventions Description < 3  Outcome: Progressing   Problem: Skin Integrity: Goal: Demonstration of wound healing without infection will improve Description Demonstrate wound healing without infection with min assist  Outcome: Progressing   Problem: Consults Goal: RH GENERAL PATIENT EDUCATION Description See Patient Education module for education specifics. Outcome: Progressing Goal: Skin Care Protocol Initiated - if Braden Score 18 or less Description If consults are not indicated, leave blank or  document N/A Outcome: Progressing Goal: Nutrition Consult-if indicated Outcome: Progressing Goal: Diabetes Guidelines if Diabetic/Glucose > 140 Description If diabetic or lab glucose is > 140 mg/dl - Initiate Diabetes/Hyperglycemia Guidelines & Document Interventions  Outcome: Progressing   Problem: RH SKIN INTEGRITY Goal: RH STG SKIN FREE OF INFECTION/BREAKDOWN Description Prevent skin infection or breakdown while in rehab with min assist  Outcome: Progressing   Problem: RH SAFETY Goal: RH STG ADHERE TO SAFETY PRECAUTIONS W/ASSISTANCE/DEVICE Description STG Adhere to Safety Precautions With min Assistance/Device.  Outcome: Progressing Goal: RH STG DECREASED RISK OF FALL WITH ASSISTANCE Description STG Decreased Risk of Fall With min  Assistance.  Outcome: Progressing   Problem: RH KNOWLEDGE DEFICIT GENERAL Goal: RH STG INCREASE KNOWLEDGE OF SELF CARE AFTER HOSPITALIZATION Description Improve self-care knowledge after hospitalization with min assist  Outcome: Progressing   Problem: Metabolic: Goal: Ability to maintain appropriate glucose levels will improve Description Maintain acceptable glucose level with min assist  Outcome: Progressing

## 2018-01-28 NOTE — Progress Notes (Signed)
[facility] PHYSICAL MEDICINE & REHABILITATION     PROGRESS NOTE    Subjective/Complaints: Patient seen sitting up in bed this morning. He states he slept well overnight. He notes symptomatic hypoglycemia yesterday. He states he is ready for discharge.  ROS: Denies CP, SOB, N/V/D  Objective: Vital Signs: Blood pressure 130/75, pulse 75, temperature 97.7 F (36.5 C), temperature source Oral, resp. rate 18, height 6' (1.829 m), weight 84.5 kg (186 lb 4.6 oz), SpO2 98 %. No results found. No results for input(s): WBC, HGB, HCT, PLT in the last 72 hours. No results for input(s): NA, K, CL, GLUCOSE, BUN, CREATININE, CALCIUM in the last 72 hours.  Invalid input(s): CO CBG (last 3)  Recent Labs    01/27/18 1930 01/27/18 2010 01/28/18 0631  GLUCAP 47* 75 146*    Wt Readings from Last 3 Encounters:  01/28/18 84.5 kg (186 lb 4.6 oz)  01/13/18 88.9 kg (196 lb)  01/04/18 88.9 kg (196 lb)    Physical Exam:  Constitutional: No distress . Vital signs reviewed. HENT: Normocephalic.  Atraumatic. Eyes: EOMI. No discharge. Cardiovascular: RRR. No JVD. Respiratory: CTA Bilaterally. Normal effort. GI: BS +. Non-distended. Musc: Left transmetatarsal amputation  Neurological: He isalertand oriented  Good insight and awareness  Motor: B/l UE motor 5/5.  LLE 4/5 HF, KE (some pain inhibition).  RLE 4+/5 proximal to distal.  Skin: Skin iswarm. Maceration of distal wound, no erythema, small open area mid incision.  Photo taken in media Psychiatric: Pleasant  Assessment/Plan: 1.  Functional mobility deficits secondary to left transmetatarsal amputation which require 3+ hours per day of interdisciplinary therapy in a comprehensive inpatient rehab setting. Physiatrist is providing close team supervision and 24 hour management of active medical problems listed below. Physiatrist and rehab team continue to assess barriers to discharge/monitor patient progress toward functional and medical  goals.  Function:  Bathing Bathing position   Position: Wheelchair/chair at sink  Bathing parts Body parts bathed by patient: Right arm, Left arm, Chest, Abdomen, Left lower leg, Front perineal area, Buttocks, Right upper leg, Left upper leg, Right lower leg Body parts bathed by helper: Back  Bathing assist Assist Level: More than reasonable time      Upper Body Dressing/Undressing Upper body dressing   What is the patient wearing?: Hospital gown         Button up shirt - Perfomed by patient: Thread/unthread right sleeve, Thread/unthread left sleeve, Pull shirt around back, Button/unbutton shirt      Upper body assist Assist Level: More than reasonable time      Lower Body Dressing/Undressing Lower body dressing   What is the patient wearing?: Underwear Underwear - Performed by patient: Thread/unthread right underwear leg, Thread/unthread left underwear leg, Pull underwear up/down   Pants- Performed by patient: Thread/unthread right pants leg, Thread/unthread left pants leg, Pull pants up/down, Fasten/unfasten pants                        Lower body assist Assist for lower body dressing: More than reasonable time      Toileting Toileting   Toileting steps completed by patient: Adjust clothing prior to toileting, Performs perineal hygiene, Adjust clothing after toileting   Toileting Assistive Devices: Grab bar or rail  Toileting assist Assist level: More than reasonable time   Transfers Chair/bed transfer   Chair/bed transfer method: Stand pivot Chair/bed transfer assist level: No Help, no cues, assistive device, takes more than a reasonable amount of time Chair/bed  transfer assistive device: Armrests, Medical sales representative Ambulation activity did not occur: N/A   Max distance: 52' Assist level: No help, No cues, assistive device, takes more than a reasonable amount of time   Wheelchair   Type: Manual Max wheelchair distance: 150 Assist  Level: No help, No cues, assistive device, takes more than reasonable amount of time  Cognition Comprehension Comprehension assist level: Follows complex conversation/direction with no assist  Expression Expression assist level: Expresses complex ideas: With no assist  Social Interaction Social Interaction assist level: Interacts appropriately with others - No medications needed.  Problem Solving Problem solving assist level: Solves complex problems: Recognizes & self-corrects  Memory Memory assist level: Complete Independence: No helper   Medical Problem List and Plan: 1.Functional deficitssecondary to left transmetatarsal amputation  D/c today    Notes reviewed, labs reviewed 2. DVT Prophylaxis/Anticoagulation: Pharmaceutical:Lovenox 3. Pain Management: denies curren tpain 4. Mood:LCSW to follow for evaluation and support 5. Neuropsych: This patientiscapable of making decisions on hisown behalf. 6. Skin/Wound Care:Continue Prevena VAC. Follow up with surgical team re: duration of use on Monday 7. Fluids/Electrolytes/Nutrition: .  Encourage p.o. intake.  Added protein supplement.  8. T1DM peripheral neuropathy Monitor BS ac/hs. Continue Lantus bid with meal coverage for better control.    -Patient appears to have awareness that he has to take better control of his blood sugars.  Dietary and diabetes education will be provided.  -Glucose control still suboptimal.  Lantus to 55 units daily CBG (last 3)  Recent Labs    01/27/18 1930 01/27/18 2010 01/28/18 0631  GLUCAP 47* 75 146*   Labile, with symptomatic hypoglycemia Yesterday 9. HTN: Monitor BP bid. Continue coreg bid Vitals:   01/27/18 1345 01/28/18 0521  BP: 115/65 130/75  Pulse: 80 75  Resp: 16 18  Temp: 98.4 F (36.9 C) 97.7 F (36.5 C)  SpO2: 99% 98%   Controlled 5/4   Coreg  resumed low dose 10 Chronic combined systolic diastolic CHF: Heart healthy diet--compensated on coreg, Crestor and ASA. Off  spironolactone and lisinopril at this time due to issue with hypotension.    Filed Weights   01/26/18 0517 01/27/18 0500 01/28/18 0500  Weight: 82 kg (180 lb 12.4 oz) 65.3 kg (143 lb 15.4 oz) 84.5 kg (186 lb 4.6 oz)    ? Reliability 11. CAD s/p recent stent: On Brilinta, ASA and Crestor.  12 ABLA: Hemoglobin 9.9 on 4/30 13. AKI: Due to vancomycin. Encourage fluid intake. Continue to monitor closely for recovery.Creat back to baseline, BUN mildly elevated enc po fluid   LOS (Days) 8 A FACE TO FACE EVALUATION WAS PERFORMED  Carrie Usery Lorie Phenix, MD 01/28/2018 9:39 AM

## 2018-01-30 ENCOUNTER — Telehealth: Payer: Self-pay | Admitting: *Deleted

## 2018-01-30 NOTE — Telephone Encounter (Signed)
Unable to reach patient at time of TCM Call.  Line busy. Will attempt call back tomorrow.

## 2018-01-31 ENCOUNTER — Telehealth: Payer: Self-pay | Admitting: Family Medicine

## 2018-01-31 ENCOUNTER — Telehealth (INDEPENDENT_AMBULATORY_CARE_PROVIDER_SITE_OTHER): Payer: Self-pay

## 2018-01-31 ENCOUNTER — Telehealth: Payer: Self-pay | Admitting: Registered Nurse

## 2018-01-31 DIAGNOSIS — Z89422 Acquired absence of other left toe(s): Secondary | ICD-10-CM | POA: Diagnosis not present

## 2018-01-31 DIAGNOSIS — E1065 Type 1 diabetes mellitus with hyperglycemia: Secondary | ICD-10-CM | POA: Diagnosis not present

## 2018-01-31 DIAGNOSIS — I110 Hypertensive heart disease with heart failure: Secondary | ICD-10-CM | POA: Diagnosis not present

## 2018-01-31 DIAGNOSIS — I2510 Atherosclerotic heart disease of native coronary artery without angina pectoris: Secondary | ICD-10-CM | POA: Diagnosis not present

## 2018-01-31 DIAGNOSIS — Z4781 Encounter for orthopedic aftercare following surgical amputation: Secondary | ICD-10-CM | POA: Diagnosis not present

## 2018-01-31 DIAGNOSIS — Z7902 Long term (current) use of antithrombotics/antiplatelets: Secondary | ICD-10-CM | POA: Diagnosis not present

## 2018-01-31 DIAGNOSIS — D1803 Hemangioma of intra-abdominal structures: Secondary | ICD-10-CM | POA: Diagnosis not present

## 2018-01-31 DIAGNOSIS — E785 Hyperlipidemia, unspecified: Secondary | ICD-10-CM | POA: Diagnosis not present

## 2018-01-31 DIAGNOSIS — Z7982 Long term (current) use of aspirin: Secondary | ICD-10-CM | POA: Diagnosis not present

## 2018-01-31 DIAGNOSIS — I255 Ischemic cardiomyopathy: Secondary | ICD-10-CM | POA: Diagnosis not present

## 2018-01-31 DIAGNOSIS — E1042 Type 1 diabetes mellitus with diabetic polyneuropathy: Secondary | ICD-10-CM | POA: Diagnosis not present

## 2018-01-31 DIAGNOSIS — I5042 Chronic combined systolic (congestive) and diastolic (congestive) heart failure: Secondary | ICD-10-CM | POA: Diagnosis not present

## 2018-01-31 DIAGNOSIS — K219 Gastro-esophageal reflux disease without esophagitis: Secondary | ICD-10-CM | POA: Diagnosis not present

## 2018-01-31 NOTE — Telephone Encounter (Signed)
Transitional Care call Transitional Care Call Completed, Appointment Confirmed, Address Confirmed, New Patient Packet Mailed Transitional Care Call Questions answered by Vaughan Basta: Girlfriend  Patient name: David Lane DOB: April 19, 1946 1. Are you/is patient experiencing any problems since coming home? No a. Are there any questions regarding any aspect of care? No 2. Are there any questions regarding medications administration/dosing? No a. Are meds being taken as prescribed? Yes b. "Patient should review meds with caller to confirm"  3. Have there been any falls? No 4. Has Home Health been to the house and/or have they contacted you? Yes, [facility] a. If not, have you tried to contact them? NA b. Can we help you contact them? NA 5. Are bowels and bladder emptying properly? Yes a. Are there any unexpected incontinence issues? No b. If applicable, is patient following bowel/bladder programs? NA 6. Any fevers, problems with breathing, unexpected pain? No 7. Are there any skin problems or new areas of breakdown? No 8. Has the patient/family member arranged specialty MD follow up (ie cardiology/neurology/renal/surgical/etc.)?  Yes a. Can we help arrange? NA 9. Does the patient need any other services or support that we can help arrange? No 10. Are caregivers following through as expected in assisting the patient? Yes 11. Has the patient quit smoking, drinking alcohol, or using drugs as recommended? Vaughan Basta states [name] doesn't smoke, drink alcohol or use illicit drugs.  Appointment date/time 02/08/2018, arrival time 10:40 for 11:00 appointment with Danella Sensing ANP-C. At [place]

## 2018-01-31 NOTE — Telephone Encounter (Signed)
Copied from [place] [id number]. Topic: Quick Communication - See Telephone Encounter >> Jan 31, 2018 12:14 PM Bea Graff, NT wrote: CRM for notification. See Telephone encounter for: 01/31/18. Donita with [facility] calling to verify that Dr. Sherren Mocha will sign the order and follow-up with the patient for home health once she faxes over the form. CB#: [phone]

## 2018-01-31 NOTE — Telephone Encounter (Signed)
Donita with [facility] called concerning patient's wound care orders.  Advised her that patient is to have a dry dressing daily and wound care orders will be updated on Monday, 02/06/18 at patient's appt. per Autumn.  Cb# is [phone].

## 2018-01-31 NOTE — Telephone Encounter (Signed)
Called patient, person who answered stated "we must have a bad connection. You'll have to call me back. I can hardly hear you." Attempted call back x2 and received busy signal. Will attempt call back at another time.

## 2018-02-01 NOTE — Telephone Encounter (Signed)
Spoke with David Lane from [facility] stated that they had a referral for Home Health for pt and wanted to confirm that Dr Sherren Mocha approves the care.

## 2018-02-03 ENCOUNTER — Telehealth: Payer: Self-pay

## 2018-02-03 NOTE — Telephone Encounter (Signed)
Approval given

## 2018-02-03 NOTE — Telephone Encounter (Signed)
Needs verbal orders for PT for 1wk1, 2wk2,1wk1

## 2018-02-05 NOTE — Progress Notes (Signed)
Cardiology Office Note   Date:  02/06/2018   ID:  Tadao, Emig 07/29/1946, MRN [id number]  PCP:  Dorena Cookey, MD  Cardiologist: Dr. Sallyanne Kuster  Chief Complaint  Patient presents with  . Follow-up    discuss meds     History of Present Illness: David Lane is a 72 y.o. male who presents for posthospitalization with known history of coronary artery disease, history of multiple stents with anterior MI due to restenosis and thrombus of the LAD, ischemic cardiomyopathy with EF of 30% to 40%, hypertension, hypercholesterolemia, history of poorly controlled insulin-dependent diabetes.  T  The patient was discharged from the hospital on 12/25/2017 after anterior STEMI with cardiac catheterization finding LAD thrombosis treated with a PCI, now on dual antiplatelet therapy with Brilinta and aspirin.  The patient [facility]  He has had recent right transmetatarsal amputation and is being followed by [facility] for dressing changes. He is hypertensive today. Very talkative. Denies pain or dyspnea. No bleeding issues on Brilinta. He is medically compliant.   Past Medical History:  Diagnosis Date  . CAD (coronary artery disease)    last cath by [facility] DR.  Mihai Croitoru showing  some  disease involving LCX and small size of Diag   . CHF exacerbation, due to diastolic dysfunction 30/16/0109  . Chronic systolic CHF (congestive heart failure), NYHA class 1 ([place]) 07/17/2012  . GERD (gastroesophageal reflux disease)   . Hepatic lesion 02/04/11  . Hyperlipemia   . Hypertension   . Ischemic cardiomyopathy    EF 35-40%  . Liver hemangioma   . NSTEMI (non-ST elevated myocardial infarction) ([place]) 11/21/2009  . Pancreatitis 2000's  . Shortness of breath   . Type II diabetes mellitus ([place])     Past Surgical History:  Procedure Laterality Date  . AMPUTATION Left 01/14/2018   Procedure: TRANSMETATARSAL AMPUTATION;  Surgeon: Newt Minion, MD;  Location: [place];  Service: Orthopedics;  Laterality: Left;  . CARDIAC CATHETERIZATION  2011   minimal disease, medical management  . CORONARY ANGIOPLASTY WITH STENT PLACEMENT  07/14/2012   successful PCI & stenting of mid LAD & PDA off the dominant CX  . CORONARY/GRAFT ACUTE MI REVASCULARIZATION N/A 12/20/2017   Procedure: Coronary/Graft Acute MI Revascularization;  Surgeon: Martinique, Peter M, MD;  Location: [place] CV [facility];  Service: Cardiovascular;  Laterality: N/A;  . INCISION AND DRAINAGE ABSCESS ANAL  1970's  . LEFT HEART CATH AND CORONARY ANGIOGRAPHY N/A 12/20/2017   Procedure: LEFT HEART CATH AND CORONARY ANGIOGRAPHY;  Surgeon: Martinique, Peter M, MD;  Location: [place] [facility];  Service: Cardiovascular;  Laterality: N/A;  . LEFT HEART CATHETERIZATION WITH CORONARY ANGIOGRAM N/A 07/14/2012   Procedure: LEFT HEART CATHETERIZATION WITH CORONARY ANGIOGRAM;  Surgeon: [name], MD;  Location: [place] CATH LAB;  Service: Cardiovascular;  Laterality: N/A;  . PERCUTANEOUS CORONARY STENT INTERVENTION (PCI-S) Right 07/14/2012   Procedure: PERCUTANEOUS CORONARY STENT INTERVENTION (PCI-S);  Surgeon: [name], MD;  Location: [facility] CATH LAB;  Service: Cardiovascular;  Laterality: Right;     Current Outpatient Medications  Medication Sig Dispense Refill  . acetaminophen (TYLENOL) 325 MG tablet Take 1-2 tablets (325-650 mg total) by mouth every 4 (four) hours as needed for mild pain.    Marland Kitchen aspirin 81 MG tablet Take 81 mg by mouth daily.      . carvedilol (COREG) 3.125 MG tablet Take 1 tablet (3.125 mg total) by mouth 2 (two) times daily with  a meal. 60 tablet 6  . docusate sodium (COLACE) 100 MG capsule Take 1 capsule (100 mg total) by mouth 2 (two) times daily as needed for mild constipation. 10 capsule 0  . glucose blood (ACCU-CHEK AVIVA) test strip 4 (four) times daily. DX code e11.9 100 each 0  . insulin aspart (NOVOLOG) 100 UNIT/ML injection Inject 7 Units into the skin 3 (three) times daily with meals. 10  mL 0  . insulin glargine (LANTUS) 100 UNIT/ML injection Inject 0.55 mLs (55 Units total) into the skin daily. 10 mL 11  . Insulin Pen Needle (B-D UF III MINI PEN NEEDLES) 31G X 5 MM MISC use to inject insulin daily 100 each 0  . pantoprazole (PROTONIX) 40 MG tablet Take 1 tablet (40 mg total) by mouth daily. 30 tablet 0  . protein supplement shake (PREMIER PROTEIN) LIQD Take 325 mLs (11 oz total) by mouth 3 (three) times daily with meals.  0  . rosuvastatin (CRESTOR) 40 MG tablet Take 1 tablet (40 mg total) by mouth at bedtime. 30 tablet 1  . ticagrelor (BRILINTA) 90 MG TABS tablet Take 1 tablet (90 mg total) by mouth 2 (two) times daily. 60 tablet 0   No current facility-administered medications for this visit.     Allergies:   Lipitor [atorvastatin] and Metformin and related    Social History:  The patient  reports that he has never smoked. He has never used smokeless tobacco. He reports that he does not drink alcohol or use drugs.   Family History:  The patient's family history includes Diabetes in his maternal grandfather, mother, and paternal grandmother.    ROS: All other systems are reviewed and negative. Unless otherwise mentioned in H&P    PHYSICAL EXAM: VS:  BP (!) 150/90   Pulse 87   Ht 6' (1.829 m)   BMI 25.27 kg/m  , BMI Body mass index is 25.27 kg/m. GEN: Well nourished, well developed, in no acute distress  HEENT: normal  Neck: no JVD, carotid bruits, or masses Cardiac: RRR; no murmurs, rubs, or gallops,no edema  Respiratory:  Clear to auscultation bilaterally, normal work of breathing, mild crackles are noted.  GI: soft, nontender, nondistended, + BS MS: no deformity or atrophy  Right foot dressing with amputation of metatarsal(s).  Skin: warm and dry, no rash Neuro:  Strength and sensation are intact Psych: euthymic mood, full affect   EKG:  Not completed on this office visit.   Recent Labs: 01/21/2018: ALT 25 01/24/2018: BUN 29; Creatinine, Ser 1.29;  Hemoglobin 9.9; Platelets 396; Potassium 4.3; Sodium 140    Lipid Panel    Component Value Date/Time   CHOL 102 12/21/2017 0345   TRIG 70 12/21/2017 0345   HDL 24 (L) 12/21/2017 0345   CHOLHDL 4.3 12/21/2017 0345   VLDL 14 12/21/2017 0345   LDLCALC 64 12/21/2017 0345      Wt Readings from Last 3 Encounters:  01/28/18 186 lb 4.6 oz (84.5 kg)  01/13/18 196 lb (88.9 kg)  01/04/18 196 lb (88.9 kg)      Other studies Reviewed: Echocardiogram Dec 22, 2017  Conclusion     Mid LAD lesion is 100% stenosed.  A drug-eluting stent was successfully placed using a STENT SYNERGY DES 2.5X32.  Post intervention, there is a 0% residual stenosis.  Ost LAD to Prox LAD lesion is 40% stenosed.  Mid LAD to Dist LAD lesion is 50% stenosed.  Ost 1st Mrg lesion is 50% stenosed.  LPDA lesion is 75% stenosed.  Previously placed Dist Cx stent (unknown type) is widely patent.  Ost LPDA to LPDA lesion is 80% stenosed.  Prox RCA to Mid RCA lesion is 95% stenosed.  There is mild left ventricular systolic dysfunction.  LV end diastolic pressure is normal.  The left ventricular ejection fraction is 45-50% by visual estimate.   1. 3 vessel obstructive CAD.     - 100% mid LAD in stent thrombosis. This is the culprit lesion    - 80% focal stenosis in stent in the PDA with 75% focal stenosis distal to the stent.    - 95% small nondominant RCA 2. Mild LV dysfunction with apical wall motion abnormality. Overall EF 45-50% 3. Normal LVEDP 4. Successful PCI of the mid LAD with DES.    Echocardiogram 12/21/2017  Left ventricle: The cavity size was normal. Wall thickness was   increased in a pattern of mild LVH. Apical anterior and apical   inferior akinesis. Mid to apical anteroseptal akinesis. Akinesis   of the apex. Systolic function was moderately reduced. The   estimated ejection fraction was in the range of 35% to 40%.   Doppler parameters are consistent with abnormal left ventricular    relaxation (grade 1 diastolic dysfunction). - Aortic valve: There was trivial regurgitation. - Aorta: Mildly dilated ascending aorta to 4.0 cm. - Mitral valve: There was no significant regurgitation. - Right ventricle: The cavity size was normal. Systolic function   was normal. - Tricuspid valve: Peak RV-RA gradient (S): 17 mm Hg. - Pulmonary arteries: PA peak pressure: 20 mm Hg (S). - Inferior vena cava: The vessel was normal in size. The   respirophasic diameter changes were in the normal range (>= 50%),   consistent with normal central venous pressure.  Impressions:  - Normal LV size with mild LV hypertrophy. Wall motion   abnormalities as noted above. EF 35-40%. Normal RV size and   systolic function. No significant valvular abnormalities.   ASSESSMENT AND PLAN:  1.  Ischemic Cardiomyopathy: BP is not well controlled on current medication regimen. EF of 35%-40% He is currently on coreg 3.125 mg BID. I will increase his dose to 6.25 mg BID. He is not currently on ACE inhibitor. Would need to have low dose for cardio/renal protection as well. Will add this on next visit if creatinine remains stable. He brings with him home BP recordings with levels in the 118/77 -123/72 range  With HR 79-88.   2. CAD: Recent cardiac cath on 12/20/2017 requiring DES to the mid LAD. He remains on DAPT with Brilinta and ASA. He is not interested in cardiac rehab at this time.   3. Hypercholesterolemia:  Remains on rosuvastatin 40 mg daily.   4. IDDM:  Management per PCP   Current medicines are reviewed at length with the patient today.    Labs/ tests ordered today include: BMET and CBC before next appointment.   Phill Myron. West Pugh, ANP, AACC   02/06/2018 2:47 PM    [facility] [address] [place], [place] [address] Phone: [phone]; Fax: [phone]

## 2018-02-06 ENCOUNTER — Encounter: Payer: Self-pay | Admitting: Adult Health

## 2018-02-06 ENCOUNTER — Inpatient Hospital Stay: Payer: Medicare Other | Admitting: Family Medicine

## 2018-02-06 ENCOUNTER — Encounter (INDEPENDENT_AMBULATORY_CARE_PROVIDER_SITE_OTHER): Payer: Self-pay | Admitting: Orthopedic Surgery

## 2018-02-06 ENCOUNTER — Ambulatory Visit: Payer: Medicare Other | Admitting: Adult Health

## 2018-02-06 ENCOUNTER — Encounter: Payer: Self-pay | Admitting: Endocrinology

## 2018-02-06 ENCOUNTER — Ambulatory Visit (INDEPENDENT_AMBULATORY_CARE_PROVIDER_SITE_OTHER): Payer: Medicare Other | Admitting: Orthopedic Surgery

## 2018-02-06 ENCOUNTER — Telehealth: Payer: Self-pay

## 2018-02-06 VITALS — Ht 72.0 in | Wt 186.0 lb

## 2018-02-06 VITALS — BP 150/90 | HR 87 | Ht 72.0 in

## 2018-02-06 DIAGNOSIS — Z89432 Acquired absence of left foot: Secondary | ICD-10-CM

## 2018-02-06 DIAGNOSIS — I43 Cardiomyopathy in diseases classified elsewhere: Secondary | ICD-10-CM

## 2018-02-06 DIAGNOSIS — I2510 Atherosclerotic heart disease of native coronary artery without angina pectoris: Secondary | ICD-10-CM

## 2018-02-06 DIAGNOSIS — Z955 Presence of coronary angioplasty implant and graft: Secondary | ICD-10-CM

## 2018-02-06 DIAGNOSIS — Z79899 Other long term (current) drug therapy: Secondary | ICD-10-CM | POA: Diagnosis not present

## 2018-02-06 DIAGNOSIS — I10 Essential (primary) hypertension: Secondary | ICD-10-CM

## 2018-02-06 MED ORDER — CARVEDILOL 6.25 MG PO TABS: 6.2500 mg | ORAL_TABLET | Freq: Two times a day (BID) | ORAL | 3 refills | Status: DC

## 2018-02-06 NOTE — Progress Notes (Signed)
Office Visit Note   Patient: David Lane           Date of Birth: 1946/01/18           MRN: [id number] Visit Date: 02/06/2018              Requested by: Dorena Cookey, MD [place], [place] [address] PCP: Dorena Cookey, MD  Chief Complaint  Patient presents with  . Left Foot - Routine Post Op    01/14/18 left transmet amputation       HPI: Patient presents 3 weeks status post left transmetatarsal amputation.  He states his been elevating his foot he denies any pain denies any drainage.  Assessment & Plan: Visit Diagnoses:  1. Status post transmetatarsal amputation of left foot ([place])     Plan: We will harvest the sutures today continue nonweightbearing continue range of motion of the ankle with dorsiflexion continue elevation recommended washing with soap and water daily with 4 x 4 gauze and an Ace wrap.  Patient was given a prescription for biotech for extra-depth shoes custom orthotic spacer and double upright braces.  Follow-Up Instructions: Return in about 2 weeks (around 02/20/2018).   Ortho Exam  Patient is alert, oriented, no adenopathy, well-dressed, normal affect, normal respiratory effort. Examination incision is healing nicely there is no redness no cellulitis there is no tenderness to palpation there is no drainage no signs of infection.  Patient has good dorsiflexion of the ankle.  We will harvest the sutures today.  Imaging: No results found. No images are attached to the encounter.  Labs: Lab Results  Component Value Date   HGBA1C 10.0 (H) 12/21/2017   HGBA1C 10.0 (H) 12/20/2017   HGBA1C 9.2 06/08/2017   ESRSEDRATE 105 (H) 01/13/2018   CRP 19.9 (H) 01/13/2018   REPTSTATUS 01/21/2018 FINAL 01/14/2018   GRAMSTAIN  01/14/2018    ABUNDANT WBC PRESENT, PREDOMINANTLY PMN MODERATE GRAM NEGATIVE RODS FEW GRAM POSITIVE COCCI IN PAIRS    CULT  01/14/2018    MODERATE ESCHERICHIA COLI MODERATE ENTEROCOCCUS FAECALIS ABUNDANT  BACTEROIDES FRAGILIS BETA LACTAMASE POSITIVE Performed at [facility], [place] [address] [place], [place] [address]    LABORGA ESCHERICHIA COLI 01/14/2018   LABORGA ENTEROCOCCUS FAECALIS 01/14/2018     Lab Results  Component Value Date   ALBUMIN 2.1 (L) 01/21/2018   ALBUMIN 2.3 (L) 01/12/2018   ALBUMIN 3.1 (L) 12/20/2017   PREALBUMIN <5 (L) 01/13/2018    Body mass index is 25.23 kg/m.  Orders:  No orders of the defined types were placed in this encounter.  No orders of the defined types were placed in this encounter.    Procedures: No procedures performed  Clinical Data: No additional findings.  ROS:  All other systems negative, except as noted in the HPI. Review of Systems  Objective: Vital Signs: Ht 6' (1.829 m)   Wt 186 lb (84.4 kg)   BMI 25.23 kg/m   Specialty Comments:  No specialty comments available.  PMFS History: Patient Active Problem List   Diagnosis Date Noted  . Labile blood glucose   . Hypoglycemia   . Functional gait disorder 01/20/2018  . Acute blood loss anemia 01/20/2018  . Status post transmetatarsal amputation of left foot ([place]) 01/20/2018  . Cutaneous abscess of left foot   . Sepsis due to cellulitis ([place]) 01/12/2018  . Foot ulcer due to secondary DM ([place]) 01/04/2018  . Status post coronary artery stent placement   .  Acute ST elevation myocardial infarction (STEMI) involving left anterior descending (LAD) coronary artery ([place]) 12/20/2017  . 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 Wellness examination 11/25/2014  . Screening for prostate cancer 11/25/2014  . Pancreatic insufficiency 01/25/2013  . Ischemic cardiomyopathy, EF 50% by echo 08/01/13 07/19/2012  . Chronic combined systolic and diastolic CHF (congestive heart failure) ([place]) 07/14/2012  . Pulmonary edema, most likely due to diastolic dysfunction 71/69/6789  . CAD, 07/14/12- LAD/PDA DES   . ANXIETY 12/12/2009  . Hyperlipidemia  04/24/2007  . Essential hypertension 04/24/2007  . PANCREATITIS, HX OF 04/24/2007   Past Medical History:  Diagnosis Date  . CAD (coronary artery disease)    last cath by [facility] DR.  Mihai Croitoru showing  some  disease involving LCX and small size of Diag   . CHF exacerbation, due to diastolic dysfunction 38/06/1750  . Chronic systolic CHF (congestive heart failure), NYHA class 1 ([place]) 07/17/2012  . GERD (gastroesophageal reflux disease)   . Hepatic lesion 02/04/11  . Hyperlipemia   . Hypertension   . Ischemic cardiomyopathy    EF 35-40%  . Liver hemangioma   . NSTEMI (non-ST elevated myocardial infarction) ([place]) 11/21/2009  . Pancreatitis 2000's  . Shortness of breath   . Type II diabetes mellitus ([facility])     Family History  Problem Relation Age of Onset  . Diabetes Mother   . Diabetes Paternal Grandmother   . Diabetes Maternal Grandfather   . Colon cancer Neg Hx     Past Surgical History:  Procedure Laterality Date  . AMPUTATION Left 01/14/2018   Procedure: TRANSMETATARSAL AMPUTATION;  Surgeon: Newt Minion, MD;  Location: [place];  Service: Orthopedics;  Laterality: Left;  . CARDIAC CATHETERIZATION  2011   minimal disease, medical management  . CORONARY ANGIOPLASTY WITH STENT PLACEMENT  07/14/2012   successful PCI & stenting of mid LAD & PDA off the dominant CX  . CORONARY/GRAFT ACUTE MI REVASCULARIZATION N/A 12/20/2017   Procedure: Coronary/Graft Acute MI Revascularization;  Surgeon: Martinique, Peter M, MD;  Location: [place] [facility];  Service: Cardiovascular;  Laterality: N/A;  . INCISION AND DRAINAGE ABSCESS ANAL  1970's  . LEFT HEART CATH AND CORONARY ANGIOGRAPHY N/A 12/20/2017   Procedure: LEFT HEART CATH AND CORONARY ANGIOGRAPHY;  Surgeon: Martinique, Peter M, MD;  Location: [place] [facility];  Service: Cardiovascular;  Laterality: N/A;  . LEFT HEART CATHETERIZATION WITH CORONARY ANGIOGRAM N/A 07/14/2012   Procedure: LEFT HEART CATHETERIZATION WITH CORONARY ANGIOGRAM;   Surgeon: [name], MD;  Location: [place] CATH LAB;  Service: Cardiovascular;  Laterality: N/A;  . PERCUTANEOUS CORONARY STENT INTERVENTION (PCI-S) Right 07/14/2012   Procedure: PERCUTANEOUS CORONARY STENT INTERVENTION (PCI-S);  Surgeon: [name], MD;  Location[facility] LAB;  Service: Cardiovascular;  Laterality: Right;   Social History   Occupational History  . Occupation: retired    Fish farm manager: [employer]  Tobacco Use  . Smoking status: Never Smoker  . Smokeless tobacco: Never Used  Substance and Sexual Activity  . Alcohol use: No  . Drug use: No  . Sexual activity: Never

## 2018-02-06 NOTE — Patient Instructions (Addendum)
Medication Instructions:  Increase- Carvedilol 6.25 mg two times a day  Labwork: Your physician recommends that you return for lab work in: 3 months before appointment with Dr. Sallyanne Kuster. (BMET, CBC)   Testing/Procedures: None  Follow-Up: Your physician recommends that you schedule a follow-up appointment in: 3 month with Dr. Sallyanne Kuster    Any Other Special Instructions Will Be Listed Below (If Applicable).     If you need a refill on your cardiac medications before your next appointment, please call your pharmacy.

## 2018-02-06 NOTE — Telephone Encounter (Signed)
Spoke with pt to advise that Dr. Jory Sims, NP would like to increase Carvedilol to 6.25 mg Daily. Pt stated that he would like to discuss the changes with her before he made any changes. Inform Pt that she was available to talk at that moment but he verbalized he would have to call back.

## 2018-02-06 NOTE — Progress Notes (Signed)
Thank you MCr 

## 2018-02-08 ENCOUNTER — Encounter: Payer: Self-pay | Admitting: Registered Nurse

## 2018-02-08 ENCOUNTER — Encounter: Payer: Medicare Other | Attending: Registered Nurse | Admitting: Registered Nurse

## 2018-02-08 VITALS — BP 138/79 | HR 84 | Ht 72.0 in | Wt 192.0 lb

## 2018-02-08 DIAGNOSIS — I2510 Atherosclerotic heart disease of native coronary artery without angina pectoris: Secondary | ICD-10-CM | POA: Diagnosis not present

## 2018-02-08 DIAGNOSIS — Z89432 Acquired absence of left foot: Secondary | ICD-10-CM | POA: Diagnosis not present

## 2018-02-08 DIAGNOSIS — Z4781 Encounter for orthopedic aftercare following surgical amputation: Secondary | ICD-10-CM | POA: Diagnosis not present

## 2018-02-08 DIAGNOSIS — Z833 Family history of diabetes mellitus: Secondary | ICD-10-CM | POA: Diagnosis not present

## 2018-02-08 DIAGNOSIS — Z955 Presence of coronary angioplasty implant and graft: Secondary | ICD-10-CM | POA: Insufficient documentation

## 2018-02-08 DIAGNOSIS — I5022 Chronic systolic (congestive) heart failure: Secondary | ICD-10-CM | POA: Insufficient documentation

## 2018-02-08 DIAGNOSIS — E089 Diabetes mellitus due to underlying condition without complications: Secondary | ICD-10-CM

## 2018-02-08 DIAGNOSIS — I10 Essential (primary) hypertension: Secondary | ICD-10-CM

## 2018-02-08 DIAGNOSIS — K8689 Other specified diseases of pancreas: Secondary | ICD-10-CM | POA: Diagnosis not present

## 2018-02-08 DIAGNOSIS — E785 Hyperlipidemia, unspecified: Secondary | ICD-10-CM | POA: Insufficient documentation

## 2018-02-08 DIAGNOSIS — I252 Old myocardial infarction: Secondary | ICD-10-CM | POA: Diagnosis not present

## 2018-02-08 DIAGNOSIS — E7849 Other hyperlipidemia: Secondary | ICD-10-CM | POA: Diagnosis not present

## 2018-02-08 DIAGNOSIS — Z79899 Other long term (current) drug therapy: Secondary | ICD-10-CM | POA: Insufficient documentation

## 2018-02-08 DIAGNOSIS — E119 Type 2 diabetes mellitus without complications: Secondary | ICD-10-CM | POA: Insufficient documentation

## 2018-02-08 DIAGNOSIS — I110 Hypertensive heart disease with heart failure: Secondary | ICD-10-CM | POA: Diagnosis not present

## 2018-02-08 MED ORDER — CARVEDILOL 3.125 MG PO TABS: 3.1250 mg | ORAL_TABLET | Freq: Two times a day (BID) | ORAL | Status: DC

## 2018-02-08 NOTE — Telephone Encounter (Signed)
Follow Up  Pt calling to check on the increase of his Carvedilol. Please call  Pt c/o medication issue:  1. Name of Medication: Carvedilol  2. How are you currently taking this medication (dosage and times per day)? Take 1 tablet (6.25 mg total) by mouth 2 (two) times daily. Patient taking differently: Take 3.125 mg by mouth 3 (three) times daily.   3. Are you having a reaction (difficulty breathing--STAT)? no  4. What is your medication issue? Pt states that [facility] informed him that everything was ok but then when he picked up his medication it was increased. States he took his bp this morning and it was 124/72. Please call

## 2018-02-08 NOTE — Telephone Encounter (Signed)
Spoke with pt, he is upset because when he was seen by [employer], he was told there was not going to make changes in any of his medications. He then received a phone call telling him she was going to increase his meds and he picked up his script the carvedilol was doubled. He would like to talk to [employer] about this issue. Will forward to [employer].

## 2018-02-08 NOTE — Progress Notes (Signed)
Subjective:    Patient ID: David Lane, male    DOB: December 01, 1945, 72 y.o.   MRN: [id number]  HPI: [name] is a 72 year old male who returns for transitional care visit in follow up of his transmetatarsal amputation of left foot, diabetes 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 insufficiency, hypertension and hyperlipidemia.He has been home with home health therapies  From [facility]. He denies any pain. Also reports good appetite.  Remains non-weight bearing.  Arrived in wheelchair. Family in room.   Pain Inventory Average Pain 0 Pain Right Now 0 My pain is .  In the last 24 hours, has pain interfered with the following? General activity 5 Relation with others 0 Enjoyment of life 5 What TIME of day is your pain at its worst? na Sleep (in general) Good  Pain is worse with: na Pain improves with: na Relief from Meds: na  Mobility use a walker ability to climb steps?  no do you drive?  no use a wheelchair needs help with transfers  Function retired  Neuro/Psych No problems in this area  Prior Studies Any changes since last visit?  no  Physicians involved in your care Any changes since last visit?  no   Family History  Problem Relation Age of Onset  . Diabetes Mother   . Diabetes Paternal Grandmother   . Diabetes Maternal Grandfather   . Colon cancer Neg Hx    Social History   Socioeconomic History  . Marital status: Single    Spouse name: Not on file  . Number of children: 0  . Years of education: Not on file  . Highest education level: Not on file  Occupational History  . Occupation: retired    Fish farm manager: [employer]  Social Needs  . Financial resource strain: Not on file  . Food insecurity:    Worry: Not on file    Inability: Not on file  . Transportation needs:    Medical: Not on file    Non-medical: Not on file  Tobacco Use  . Smoking status: Never Smoker  . Smokeless tobacco: Never Used  Substance and Sexual Activity  .  Alcohol use: No  . Drug use: No  . Sexual activity: Never  Lifestyle  . Physical activity:    Days per week: Not on file    Minutes per session: Not on file  . Stress: Not on file  Relationships  . Social connections:    Talks on phone: Not on file    Gets together: Not on file    Attends religious service: Not on file    Active member of club or organization: Not on file    Attends meetings of clubs or organizations: Not on file    Relationship status: Not on file  Other Topics Concern  . Not on file  Social History Narrative  . Not on file   Past Surgical History:  Procedure Laterality Date  . AMPUTATION Left 01/14/2018   Procedure: TRANSMETATARSAL AMPUTATION;  Surgeon: Newt Minion, MD;  Location: [place];  Service: Orthopedics;  Laterality: Left;  . CARDIAC CATHETERIZATION  2011   minimal disease, medical management  . CORONARY ANGIOPLASTY WITH STENT PLACEMENT  07/14/2012   successful PCI & stenting of mid LAD & PDA off the dominant CX  . CORONARY/GRAFT ACUTE MI REVASCULARIZATION N/A 12/20/2017   Procedure: Coronary/Graft Acute MI Revascularization;  Surgeon: Martinique, Peter M, MD;  Location: [place] CV LAB;  Service: Cardiovascular;  Laterality:  N/A;  . INCISION AND DRAINAGE ABSCESS ANAL  1970's  . LEFT HEART CATH AND CORONARY ANGIOGRAPHY N/A 12/20/2017   Procedure: LEFT HEART CATH AND CORONARY ANGIOGRAPHY;  Surgeon: Martinique, Peter M, MD;  Location: [place] [facility];  Service: Cardiovascular;  Laterality: N/A;  . LEFT HEART CATHETERIZATION WITH CORONARY ANGIOGRAM N/A 07/14/2012   Procedure: LEFT HEART CATHETERIZATION WITH CORONARY ANGIOGRAM;  Surgeon: [name], MD;  Location: [place] CATH LAB;  Service: Cardiovascular;  Laterality: N/A;  . PERCUTANEOUS CORONARY STENT INTERVENTION (PCI-S) Right 07/14/2012   Procedure: PERCUTANEOUS CORONARY STENT INTERVENTION (PCI-S);  Surgeon: [name], MD;  Location: [facility] C[facility];  Service: Cardiovascular;  Laterality: Right;   Past  Medical History:  Diagnosis Date  . CAD (coronary artery disease)    last cath by [facility] DR.  Mihai Croitoru showing  some  disease involving LCX and small size of Diag   . CHF exacerbation, due to diastolic dysfunction 16/06/9603  . Chronic systolic CHF (congestive heart failure), NYHA class 1 ([place]) 07/17/2012  . GERD (gastroesophageal reflux disease)   . Hepatic lesion 02/04/11  . Hyperlipemia   . Hypertension   . Ischemic cardiomyopathy    EF 35-40%  . Liver hemangioma   . NSTEMI (non-ST elevated myocardial infarction) ([place]) 11/21/2009  . Pancreatitis 2000's  . Shortness of breath   . Type II diabetes mellitus (HCC)    BP 138/79   Pulse 84   Ht 6' (1.829 m)   Wt 192 lb (87.1 kg)   SpO2 98%   BMI 26.04 kg/m   Opioid Risk Score:   Fall Risk Score:  `1  Depression screen PHQ 2/9  Depression screen PHQ 2/9 12/05/2014  Decreased Interest 0  Down, Depressed, Hopeless 0  PHQ - 2 Score 0  Some recent data might be hidden     Review of Systems  Constitutional: Negative.   HENT: Negative.   Eyes: Negative.   Respiratory: Negative.   Cardiovascular: Negative.   Gastrointestinal: Negative.   Endocrine: Negative.   Genitourinary: Negative.   Musculoskeletal: Positive for gait problem.  Skin: Negative.   Allergic/Immunologic: Negative.   Hematological: Negative.   Psychiatric/Behavioral: Negative.   All other systems reviewed and are negative.      Objective:   Physical Exam  Constitutional: He is oriented to person, place, and time. He appears well-developed and well-nourished.  HENT:  Head: Normocephalic and atraumatic.  Neck: Normal range of motion. Neck supple.  Cardiovascular: Normal rate and regular rhythm.  Pulmonary/Chest: Effort normal and breath sounds normal.  Musculoskeletal:  Normal Muscle Bulk and Muscle Testing Reveals: Upper Extremities: Full ROM and Muscle Strength 5/5 Lower Extremities: Right: Full ROM and Muscle Strength 5/5 Left: Full ROM and  Muscle Strength 4/5 Dressing intact.  Arrived in wheelchair.  Neurological: He is alert and oriented to person, place, and time.  Skin: Skin is warm and dry.  Psychiatric: He has a normal mood and affect.  Nursing note and vitals reviewed.         Assessment & Plan:  1. S/P Transmetatarsal Amputation of Left Foot: Continue Home Health Therapies. Dr. Sharol Given Following. 2. DM Secondary to Pancreatic Insufficiency: PCP Following 3. Hypertension: Continue current medication regimen. Cardiology Following. 4. Hyperlipidemia: Continue current medication regimen Cardiology Following.   20 minutes of face to face patient care time was spent during this visit. All question was encouraged and answered.   F/U in 4-6 weeks with Dr. Letta Pate.

## 2018-02-08 NOTE — Telephone Encounter (Signed)
Dosage changed in medication list.

## 2018-02-08 NOTE — Telephone Encounter (Signed)
Spoke with patient by phone. Will return him to his original dose of coreg 3.125 mg BID. Please send new prescription.

## 2018-02-23 ENCOUNTER — Ambulatory Visit (INDEPENDENT_AMBULATORY_CARE_PROVIDER_SITE_OTHER): Payer: Medicare Other | Admitting: Orthopedic Surgery

## 2018-02-23 ENCOUNTER — Encounter (INDEPENDENT_AMBULATORY_CARE_PROVIDER_SITE_OTHER): Payer: Self-pay | Admitting: Orthopedic Surgery

## 2018-02-23 DIAGNOSIS — Z89432 Acquired absence of left foot: Secondary | ICD-10-CM

## 2018-02-23 NOTE — Progress Notes (Signed)
Office Visit Note   Patient: David Lane           Date of Birth: [birth year]           MRN: [id number] Visit Date: 02/23/2018              Requested by: Dorena Cookey, MD [place], [place] [address] PCP: Dorena Cookey, MD  Chief Complaint  Patient presents with  . Left Foot - Follow-up, Pain      HPI: Patient is a 72 year old gentleman who presents about 6 weeks status post left transmetatarsal amputation.  Assessment & Plan: Visit Diagnoses:  1. Status post transmetatarsal amputation of left foot ([place])     Plan: Patient will continue protected weightbearing continue dry dressing changes daily at follow-up in 3 weeks we will set him up for shoe wear and orthotic fitting.  Follow-Up Instructions: Return in about 3 weeks (around 03/16/2018).   Ortho Exam  Patient is alert, oriented, no adenopathy, well-dressed, normal affect, normal respiratory effort. Examination patient does have a small area of black eschar over the residual limb.  This was trimmed with a 10 blade knife after informed consent there was good petechial bleeding.  Iodosorb 4 x 4 and Ace wrap was applied.  Imaging: No results found. No images are attached to the encounter.  Labs: Lab Results  Component Value Date   HGBA1C 10.0 (H) 12/21/2017   HGBA1C 10.0 (H) 12/20/2017   HGBA1C 9.2 06/08/2017   ESRSEDRATE 105 (H) 01/13/2018   CRP 19.9 (H) 01/13/2018   REPTSTATUS 01/21/2018 FINAL 01/14/2018   GRAMSTAIN  01/14/2018    ABUNDANT WBC PRESENT, PREDOMINANTLY PMN MODERATE GRAM NEGATIVE RODS FEW GRAM POSITIVE COCCI IN PAIRS    CULT  01/14/2018    MODERATE ESCHERICHIA COLI MODERATE ENTEROCOCCUS FAECALIS ABUNDANT BACTEROIDES FRAGILIS BETA LACTAMASE POSITIVE Performed at [facility], [place] [address] [place], [place] [address]    LABORGA ESCHERICHIA COLI 01/14/2018   LABORGA ENTEROCOCCUS FAECALIS 01/14/2018     Lab Results  Component Value Date   ALBUMIN 2.1 (L)  01/21/2018   ALBUMIN 2.3 (L) 01/12/2018   ALBUMIN 3.1 (L) 12/20/2017   PREALBUMIN <5 (L) 01/13/2018    There is no height or weight on file to calculate BMI.  Orders:  No orders of the defined types were placed in this encounter.  No orders of the defined types were placed in this encounter.    Procedures: No procedures performed  Clinical Data: No additional findings.  ROS:  All other systems negative, except as noted in the HPI. Review of Systems  Objective: Vital Signs: There were no vitals taken for this visit.  Specialty Comments:  No specialty comments available.  PMFS History: Patient Active Problem List   Diagnosis Date Noted  . Labile blood glucose   . Hypoglycemia   . Functional gait disorder 01/20/2018  . Acute blood loss anemia 01/20/2018  . Status post transmetatarsal amputation of left foot ([place]) 01/20/2018  . Cutaneous abscess of left foot   . Sepsis due to cellulitis ([place]) 01/12/2018  . Foot ulcer due to secondary DM ([place]) 01/04/2018  . Status post coronary artery stent placement   . Acute ST elevation myocardial infarction (STEMI) involving left anterior descending (LAD) coronary artery ([place]) 12/20/2017  . 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 ([place]) 12/14/2015  . Wellness examination 11/25/2014  . Screening for prostate cancer 11/25/2014  . Pancreatic insufficiency 01/25/2013  . Ischemic cardiomyopathy, EF 50%  by echo 08/01/13 07/19/2012  . Chronic combined systolic and diastolic CHF (congestive heart failure) ([place]) 07/14/2012  . Pulmonary edema, most likely due to diastolic dysfunction 67/67/2094  . CAD, 07/14/12- LAD/PDA DES   . ANXIETY 12/12/2009  . Hyperlipidemia 04/24/2007  . Essential hypertension 04/24/2007  . PANCREATITIS, HX OF 04/24/2007   Past Medical History:  Diagnosis Date  . CAD (coronary artery disease)    last cath by [facility] DR.  Mihai Croitoru showing  some  disease involving LCX and small size of Diag   .  CHF exacerbation, due to diastolic dysfunction 70/96/2836  . Chronic systolic CHF (congestive heart failure), NYHA class 1 ([place]) 07/17/2012  . GERD (gastroesophageal reflux disease)   . Hepatic lesion 02/04/11  . Hyperlipemia   . Hypertension   . Ischemic cardiomyopathy    EF 35-40%  . Liver hemangioma   . NSTEMI (non-ST elevated myocardial infarction) ([place]) 11/21/2009  . Pancreatitis 2000's  . Shortness of breath   . Type II diabetes mellitus (HCC)     Family History  Problem Relation Age of Onset  . Diabetes Mother   . Diabetes Paternal Grandmother   . Diabetes Maternal Grandfather   . Colon cancer Neg Hx     Past Surgical History:  Procedure Laterality Date  . AMPUTATION Left 01/14/2018   Procedure: TRANSMETATARSAL AMPUTATION;  Surgeon: Newt Minion, MD;  Location: [place];  Service: Orthopedics;  Laterality: Left;  . CARDIAC CATHETERIZATION  2011   minimal disease, medical management  . CORONARY ANGIOPLASTY WITH STENT PLACEMENT  07/14/2012   successful PCI & stenting of mid LAD & PDA off the dominant CX  . CORONARY/GRAFT ACUTE MI REVASCULARIZATION N/A 12/20/2017   Procedure: Coronary/Graft Acute MI Revascularization;  Surgeon: Martinique, Peter M, MD;  Location: [place] [facility];  Service: Cardiovascular;  Laterality: N/A;  . INCISION AND DRAINAGE ABSCESS ANAL  1970's  . LEFT HEART CATH AND CORONARY ANGIOGRAPHY N/A 12/20/2017   Procedure: LEFT HEART CATH AND CORONARY ANGIOGRAPHY;  Surgeon: Martinique, Peter M, MD;  Location: [place] [facility];  Service: Cardiovascular;  Laterality: N/A;  . LEFT HEART CATHETERIZATION WITH CORONARY ANGIOGRAM N/A 07/14/2012   Procedure: LEFT HEART CATHETERIZATION WITH CORONARY ANGIOGRAM;  Surgeon: [name], MD;  Location: [place] CATH LAB;  Service: Cardiovascular;  Laterality: N/A;  . PERCUTANEOUS CORONARY STENT INTERVENTION (PCI-S) Right 07/14/2012   Procedure: PERCUTANEOUS CORONARY STENT INTERVENTION (PCI-S);  Surgeon: [name], MD;  Location:  [facility] CATH LAB;  Service: Cardiovascular;  Laterality: Right;   Social History   Occupational History  . Occupation: retired    Fish farm manager: [employer]  Tobacco Use  . Smoking status: Never Smoker  . Smokeless tobacco: Never Used  Substance and Sexual Activity  . Alcohol use: No  . Drug use: No  . Sexual activity: Never

## 2018-02-27 DIAGNOSIS — R269 Unspecified abnormalities of gait and mobility: Secondary | ICD-10-CM | POA: Diagnosis not present

## 2018-03-07 DIAGNOSIS — E1165 Type 2 diabetes mellitus with hyperglycemia: Secondary | ICD-10-CM | POA: Diagnosis not present

## 2018-03-07 DIAGNOSIS — H4310 Vitreous hemorrhage, unspecified eye: Secondary | ICD-10-CM | POA: Diagnosis not present

## 2018-03-07 DIAGNOSIS — H4312 Vitreous hemorrhage, left eye: Secondary | ICD-10-CM | POA: Diagnosis not present

## 2018-03-07 DIAGNOSIS — H33032 Retinal detachment with giant retinal tear, left eye: Secondary | ICD-10-CM | POA: Diagnosis not present

## 2018-03-07 DIAGNOSIS — E113599 Type 2 diabetes mellitus with proliferative diabetic retinopathy without macular edema, unspecified eye: Secondary | ICD-10-CM | POA: Diagnosis not present

## 2018-03-07 DIAGNOSIS — E113411 Type 2 diabetes mellitus with severe nonproliferative diabetic retinopathy with macular edema, right eye: Secondary | ICD-10-CM | POA: Diagnosis not present

## 2018-03-10 ENCOUNTER — Ambulatory Visit: Payer: Medicare Other | Admitting: Physical Medicine & Rehabilitation

## 2018-03-12 ENCOUNTER — Other Ambulatory Visit: Payer: Self-pay | Admitting: Internal Medicine

## 2018-03-13 ENCOUNTER — Telehealth: Payer: Self-pay | Admitting: *Deleted

## 2018-03-13 NOTE — Telephone Encounter (Signed)
See note below

## 2018-03-13 NOTE — Telephone Encounter (Signed)
   [facility] Pre-operative Risk Assessment    Request for surgical clearance:  1. What type of surgery is being performed? VITRECTOMY   2. When is this surgery scheduled? 03-15-18   3. What type of clearance is required (medical clearance vs. Pharmacy clearance to hold med vs. Both)? BOTH (STENT AND STEMI )   4. Are there any medications that need to be held prior to surgery and how long? N/A   5. Practice name and name of physician performing surgery?  DR. Hart Carwin [facility]   6. What is your office phone number : [facility] RN  [phone]    7.   What is your office fax number: : [phone]  8.   Anesthesia type (None, local, MAC, general) ? IV SEDATION   Haiven Nardone M 03/13/2018, 3:41 PM  _________________________________________________________________   (provider comments below)

## 2018-03-13 NOTE — Telephone Encounter (Signed)
Dr. Martinique wanted to get your input pt with STEMI in  11/2017 requiring stent.  She now needs Vitrectomy if pt stable and does not stop asa and Brilinta will this be ok?  Just reply to pre-op please. Thanks you

## 2018-03-14 NOTE — Telephone Encounter (Signed)
It is too soon to stop ASA and Brilinta for surgery with STEMI and DES in March. If surgery can be done on DAPT he may proceed.   Jayley Hustead Martinique MD, [facility]

## 2018-03-14 NOTE — Telephone Encounter (Signed)
   Primary Cardiologist: [name], MD  Chart reviewed as part of pre-operative protocol coverage. Patient was contacted 03/14/2018 in reference to pre-operative risk assessment for pending surgery as outlined below.  AMEDEE CERRONE was last seen on 02/06/18 by Jory Sims, NP.  Since that day, DEMETRE MONACO has done well, without chest pain or shortness of breath.  With recent STEMI in 11/2017 requiring stent, per Dr. Martinique: It is too soon to stop ASA and Brilinta for surgery. If surgery can be done on DAPT he may proceed.   Therefore, based on ACC/AHA guidelines, the patient would be at acceptable risk for the planned procedure without further cardiovascular testing.   I will route this recommendation to the requesting party via Epic fax function and remove from pre-op pool.  Please call with questions.  Daune Perch, NP 03/14/2018, 2:04 PM

## 2018-03-15 DIAGNOSIS — H4312 Vitreous hemorrhage, left eye: Secondary | ICD-10-CM | POA: Diagnosis not present

## 2018-03-15 DIAGNOSIS — H5789 Other specified disorders of eye and adnexa: Secondary | ICD-10-CM | POA: Diagnosis not present

## 2018-03-15 DIAGNOSIS — H35372 Puckering of macula, left eye: Secondary | ICD-10-CM | POA: Diagnosis not present

## 2018-03-16 ENCOUNTER — Ambulatory Visit (INDEPENDENT_AMBULATORY_CARE_PROVIDER_SITE_OTHER): Payer: Medicare Other | Admitting: Orthopedic Surgery

## 2018-03-16 ENCOUNTER — Encounter (INDEPENDENT_AMBULATORY_CARE_PROVIDER_SITE_OTHER): Payer: Self-pay | Admitting: Orthopedic Surgery

## 2018-03-16 VITALS — Ht 72.0 in | Wt 192.0 lb

## 2018-03-16 DIAGNOSIS — Z89432 Acquired absence of left foot: Secondary | ICD-10-CM

## 2018-03-16 NOTE — Progress Notes (Signed)
Office Visit Note   Patient: David Lane           Date of Birth: 1945/11/23           MRN: [id number] Visit Date: 03/16/2018              Requested by: Dorena Cookey, MD [place], [place] [address] PCP: Dorena Cookey, MD  Chief Complaint  Patient presents with  . Left Foot - Follow-up      HPI: Patient is a 72 year old gentleman who presents 2 months status post left transmetatarsal amputation.  He has no complaints no concerns no pain.  Assessment & Plan: Visit Diagnoses:  1. Status post transmetatarsal amputation of left foot ([place])     Plan: Patient will follow up with orthotist to be fit for his orthotic shoe carbon plate and double upright brace.  Follow-Up Instructions: Return in about 1 month (around 04/13/2018).   Ortho Exam  Patient is alert, oriented, no adenopathy, well-dressed, normal affect, normal respiratory effort. Examination the incision is well-healed he does have venous stasis swelling he will go to [facility] to obtain a pair of medical compression stockings.  Imaging: No results found. No images are attached to the encounter.  Labs: Lab Results  Component Value Date   HGBA1C 10.0 (H) 12/21/2017   HGBA1C 10.0 (H) 12/20/2017   HGBA1C 9.2 06/08/2017   ESRSEDRATE 105 (H) 01/13/2018   CRP 19.9 (H) 01/13/2018   REPTSTATUS 01/21/2018 FINAL 01/14/2018   GRAMSTAIN  01/14/2018    ABUNDANT WBC PRESENT, PREDOMINANTLY PMN MODERATE GRAM NEGATIVE RODS FEW GRAM POSITIVE COCCI IN PAIRS    CULT  01/14/2018    MODERATE ESCHERICHIA COLI MODERATE ENTEROCOCCUS FAECALIS ABUNDANT BACTEROIDES FRAGILIS BETA LACTAMASE POSITIVE Performed at [facility], [place] [address] [place], [place] [address]    LABORGA ESCHERICHIA COLI 01/14/2018   LABORGA ENTEROCOCCUS FAECALIS 01/14/2018     Lab Results  Component Value Date   ALBUMIN 2.1 (L) 01/21/2018   ALBUMIN 2.3 (L) 01/12/2018   ALBUMIN 3.1 (L) 12/20/2017   PREALBUMIN <5 (L) 01/13/2018    Body mass index is 26.04 kg/m.  Orders:  No orders of the defined types were placed in this encounter.  No orders of the defined types were placed in this encounter.    Procedures: No procedures performed  Clinical Data: No additional findings.  ROS:  All other systems negative, except as noted in the HPI. Review of Systems  Objective: Vital Signs: Ht 6' (1.829 m)   Wt 192 lb (87.1 kg)   BMI 26.04 kg/m   Specialty Comments:  No specialty comments available.  PMFS History: Patient Active Problem List   Diagnosis Date Noted  . Labile blood glucose   . Hypoglycemia   . Functional gait disorder 01/20/2018  . Acute blood loss anemia 01/20/2018  . Status post transmetatarsal amputation of left foot ([place]) 01/20/2018  . Cutaneous abscess of left foot   . Sepsis due to cellulitis ([place]) 01/12/2018  . Foot ulcer due to secondary DM ([place]) 01/04/2018  . Status post coronary artery stent placement   . Acute ST elevation myocardial infarction (STEMI) involving left anterior descending (LAD) coronary artery ([place]) 12/20/2017  . 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 ([place]) 12/14/2015  . Wellness examination 11/25/2014  . Screening for prostate cancer 11/25/2014  . Pancreatic insufficiency 01/25/2013  . Ischemic cardiomyopathy, EF 50% by echo 08/01/13 07/19/2012  . Chronic combined systolic and diastolic CHF (  congestive heart failure) ([place]) 07/14/2012  . Pulmonary edema, most likely due to diastolic dysfunction 56/43/3295  . CAD, 07/14/12- LAD/PDA DES   . ANXIETY 12/12/2009  . Hyperlipidemia 04/24/2007  . Essential hypertension 04/24/2007  . PANCREATITIS, HX OF 04/24/2007   Past Medical History:  Diagnosis Date  . CAD (coronary artery disease)    last cath by [facility] DR.  Mihai Croitoru showing  some  disease involving LCX and small size of Diag   . CHF exacerbation, due to diastolic dysfunction 18/84/1660  . Chronic systolic  CHF (congestive heart failure), NYHA class 1 ([place]) 07/17/2012  . GERD (gastroesophageal reflux disease)   . Hepatic lesion 02/04/11  . Hyperlipemia   . Hypertension   . Ischemic cardiomyopathy    EF 35-40%  . Liver hemangioma   . NSTEMI (non-ST elevated myocardial infarction) ([place]) 11/21/2009  . Pancreatitis 2000's  . Shortness of breath   . Type II diabetes mellitus (HCC)     Family History  Problem Relation Age of Onset  . Diabetes Mother   . Diabetes Paternal Grandmother   . Diabetes Maternal Grandfather   . Colon cancer Neg Hx     Past Surgical History:  Procedure Laterality Date  . AMPUTATION Left 01/14/2018   Procedure: TRANSMETATARSAL AMPUTATION;  Surgeon: Newt Minion, MD;  Location: [place];  Service: Orthopedics;  Laterality: Left;  . CARDIAC CATHETERIZATION  2011   minimal disease, medical management  . CORONARY ANGIOPLASTY WITH STENT PLACEMENT  07/14/2012   successful PCI & stenting of mid LAD & PDA off the dominant CX  . CORONARY/GRAFT ACUTE MI REVASCULARIZATION N/A 12/20/2017   Procedure: Coronary/Graft Acute MI Revascularization;  Surgeon: Martinique, Peter M, MD;  Location: [place] [facility] LAB;  Service: Cardiovascular;  Laterality: N/A;  . INCISION AND DRAINAGE ABSCESS ANAL  1970's  . LEFT HEART CATH AND CORONARY ANGIOGRAPHY N/A 12/20/2017   Procedure: LEFT HEART CATH AND CORONARY ANGIOGRAPHY;  Surgeon: Martinique, Peter M, MD;  Location: [place] [facility];  Service: Cardiovascular;  Laterality: N/A;  . LEFT HEART CATHETERIZATION WITH CORONARY ANGIOGRAM N/A 07/14/2012   Procedure: LEFT HEART CATHETERIZATION WITH CORONARY ANGIOGRAM;  Surgeon: [name], MD;  Location: [place] CATH LAB;  Service: Cardiovascular;  Laterality: N/A;  . PERCUTANEOUS CORONARY STENT INTERVENTION (PCI-S) Right 07/14/2012   Procedure: PERCUTANEOUS CORONARY STENT INTERVENTION (PCI-S);  Surgeon: [name], MD;  Location: [facility] C[facility] LAB;  Service: Cardiovascular;  Laterality: Right;   Social History    Occupational History  . Occupation: retired    Fish farm manager: [employer]  Tobacco Use  . Smoking status: Never Smoker  . Smokeless tobacco: Never Used  Substance and Sexual Activity  . Alcohol use: No  . Drug use: No  . Sexual activity: Never

## 2018-03-20 ENCOUNTER — Telehealth: Payer: Self-pay | Admitting: Cardiovascular Disease

## 2018-03-20 NOTE — Telephone Encounter (Signed)
Pt calling     *STAT* If patient is at the pharmacy, call can be transferred to refill team.   1. Which medications need to be refilled? (please list name of each medication and dose if known) Rosuvastatin 40mg    2. Which pharmacy/location (including street and city if local pharmacy) is medication to be sent to [facility]   3. Do they need a 30 day or 90 day supply? Don't matter

## 2018-03-22 MED ORDER — ROSUVASTATIN CALCIUM 40 MG PO TABS: 40.0000 mg | ORAL_TABLET | Freq: Every day | ORAL | 11 refills | Status: DC

## 2018-03-23 ENCOUNTER — Ambulatory Visit (INDEPENDENT_AMBULATORY_CARE_PROVIDER_SITE_OTHER): Payer: Medicare Other | Admitting: Orthopedic Surgery

## 2018-03-23 ENCOUNTER — Encounter (INDEPENDENT_AMBULATORY_CARE_PROVIDER_SITE_OTHER): Payer: Self-pay | Admitting: Orthopedic Surgery

## 2018-03-23 VITALS — Ht 72.0 in | Wt 192.0 lb

## 2018-03-23 DIAGNOSIS — S90211A Contusion of right great toe with damage to nail, initial encounter: Secondary | ICD-10-CM | POA: Diagnosis not present

## 2018-03-23 NOTE — Progress Notes (Signed)
Office Visit Note   Patient: David Lane           Date of Birth: [birth year]           MRN: [id number] Visit Date: 03/23/2018              Requested by: Dorena Cookey, MD [place], [place] [address] PCP: Dorena Cookey, MD  Chief Complaint  Patient presents with  . Left Foot - Follow-up      HPI: Patient is a 72 year old gentleman who is status post a left transmetatarsal amputation who presents with a subungual hematoma on the right foot with a draining hematoma.  Assessment & Plan: Visit Diagnoses:  1. Subungual hematoma of great toe of right foot, initial encounter     Plan: The nail was trimmed the hematoma was decompressed start antibiotic ointment dressing changes.  Follow-Up Instructions: Return in about 3 weeks (around 04/13/2018).   Ortho Exam  Patient is alert, oriented, no adenopathy, well-dressed, normal affect, normal respiratory effort. Examination patient has a new subungual hematoma of the right great toe secondary to blunt trauma.  After informed consent the nail was trimmed the subungual hematoma was decompressed.  Antibiotic ointment and a Band-Aid were applied.  Imaging: No results found. No images are attached to the encounter.  Labs: Lab Results  Component Value Date   HGBA1C 10.0 (H) 12/21/2017   HGBA1C 10.0 (H) 12/20/2017   HGBA1C 9.2 06/08/2017   ESRSEDRATE 105 (H) 01/13/2018   CRP 19.9 (H) 01/13/2018   REPTSTATUS 01/21/2018 FINAL 01/14/2018   GRAMSTAIN  01/14/2018    ABUNDANT WBC PRESENT, PREDOMINANTLY PMN MODERATE GRAM NEGATIVE RODS FEW GRAM POSITIVE COCCI IN PAIRS    CULT  01/14/2018    MODERATE ESCHERICHIA COLI MODERATE ENTEROCOCCUS FAECALIS ABUNDANT BACTEROIDES FRAGILIS BETA LACTAMASE POSITIVE Performed at [facility], [place] [address], [place], [place] [address]    LABORGA ESCHERICHIA COLI 01/14/2018   LABORGA ENTEROCOCCUS FAECALIS 01/14/2018     Lab Results  Component Value Date   ALBUMIN 2.1 (L) 01/21/2018   ALBUMIN 2.3 (L) 01/12/2018   ALBUMIN 3.1 (L) 12/20/2017   PREALBUMIN <5 (L) 01/13/2018    Body mass index is 26.04 kg/m.  Orders:  No orders of the defined types were placed in this encounter.  No orders of the defined types were placed in this encounter.    Procedures: No procedures performed  Clinical Data: No additional findings.  ROS:  All other systems negative, except as noted in the HPI. Review of Systems  Objective: Vital Signs: Ht 6' (1.829 m)   Wt 192 lb (87.1 kg)   BMI 26.04 kg/m   Specialty Comments:  No specialty comments available.  PMFS History: Patient Active Problem List   Diagnosis Date Noted  . Labile blood glucose   . Hypoglycemia   . Functional gait disorder 01/20/2018  . Acute blood loss anemia 01/20/2018  . Status post transmetatarsal amputation of left foot ([place]) 01/20/2018  . Cutaneous abscess of left foot   . Sepsis due to cellulitis ([place]) 01/12/2018  . Foot ulcer due to secondary DM ([place]) 01/04/2018  . Status post coronary artery stent placement   . Acute ST elevation myocardial infarction (STEMI) involving left anterior descending (LAD) coronary artery ([place]) 12/20/2017  . 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 ([place]) 12/14/2015  . Wellness examination 11/25/2014  . Screening for prostate cancer 11/25/2014  . Pancreatic insufficiency 01/25/2013  . Ischemic cardiomyopathy, EF 50% by  echo 08/01/13 07/19/2012  . Chronic combined systolic and diastolic CHF (congestive heart failure) ([place]) 07/14/2012  . Pulmonary edema, most likely due to diastolic dysfunction 40/04/6760  . CAD, 07/14/12- LAD/PDA DES   . ANXIETY 12/12/2009  . Hyperlipidemia 04/24/2007  . Essential hypertension 04/24/2007  . PANCREATITIS, HX OF 04/24/2007   Past Medical History:  Diagnosis Date  . CAD (coronary artery disease)    last cath by [facility] DR.  Mihai Croitoru showing  some  disease involving LCX and small size of  Diag   . CHF exacerbation, due to diastolic dysfunction 95/05/3266  . Chronic systolic CHF (congestive heart failure), NYHA class 1 ([place]) 07/17/2012  . GERD (gastroesophageal reflux disease)   . Hepatic lesion 02/04/11  . Hyperlipemia   . Hypertension   . Ischemic cardiomyopathy    EF 35-40%  . Liver hemangioma   . NSTEMI (non-ST elevated myocardial infarction) ([place]) 11/21/2009  . Pancreatitis 2000's  . Shortness of breath   . Type II diabetes mellitus (HCC)     Family History  Problem Relation Age of Onset  . Diabetes Mother   . Diabetes Paternal Grandmother   . Diabetes Maternal Grandfather   . Colon cancer Neg Hx     Past Surgical History:  Procedure Laterality Date  . AMPUTATION Left 01/14/2018   Procedure: TRANSMETATARSAL AMPUTATION;  Surgeon: Newt Minion, MD;  Location: [facility];  Service: Orthopedics;  Laterality: Left;  . CARDIAC CATHETERIZATION  2011   minimal disease, medical management  . CORONARY ANGIOPLASTY WITH STENT PLACEMENT  07/14/2012   successful PCI & stenting of mid LAD & PDA off the dominant CX  . CORONARY/GRAFT ACUTE MI REVASCULARIZATION N/A 12/20/2017   Procedure: Coronary/Graft Acute MI Revascularization;  Surgeon: Martinique, Peter M, MD;  Location: [place] CV LAB;  Service: Cardiovascular;  Laterality: N/A;  . INCISION AND DRAINAGE ABSCESS ANAL  1970's  . LEFT HEART CATH AND CORONARY ANGIOGRAPHY N/A 12/20/2017   Procedure: LEFT HEART CATH AND CORONARY ANGIOGRAPHY;  Surgeon: Martinique, Peter M, MD;  Location: [place] [facility] LAB;  Service: Cardiovascular;  Laterality: N/A;  . LEFT HEART CATHETERIZATION WITH CORONARY ANGIOGRAM N/A 07/14/2012   Procedure: LEFT HEART CATHETERIZATION WITH CORONARY ANGIOGRAM;  Surgeon: [name], MD;  Location: [place] CATH LAB;  Service: Cardiovascular;  Laterality: N/A;  . PERCUTANEOUS CORONARY STENT INTERVENTION (PCI-S) Right 07/14/2012   Procedure: PERCUTANEOUS CORONARY STENT INTERVENTION (PCI-S);  Surgeon: [name], MD;   Location: [facility] C[facility] LAB;  Service: Cardiovascular;  Laterality: Right;   Social History   Occupational History  . Occupation: retired    Fish farm manager: [employer]  Tobacco Use  . Smoking status: Never Smoker  . Smokeless tobacco: Never Used  Substance and Sexual Activity  . Alcohol use: No  . Drug use: No  . Sexual activity: Never

## 2018-03-24 ENCOUNTER — Other Ambulatory Visit: Payer: Self-pay | Admitting: Endocrinology

## 2018-03-24 NOTE — Telephone Encounter (Signed)
Please refill x 1 Ov is due  

## 2018-03-28 ENCOUNTER — Other Ambulatory Visit: Payer: Self-pay

## 2018-03-28 ENCOUNTER — Telehealth: Payer: Self-pay | Admitting: Endocrinology

## 2018-03-28 MED ORDER — INSULIN GLARGINE 100 UNIT/ML SOLOSTAR PEN: PEN_INJECTOR | SUBCUTANEOUS | 1 refills | Status: DC

## 2018-03-28 NOTE — Telephone Encounter (Signed)
Patient need a refill for three boxes of lantus pens.   [facility] [phone] - [place], [place] #:  [phone]     Patient stated that he just got out of the hospital, they took off all toes on right foot. And is not able to come in for a appointment right

## 2018-03-28 NOTE — Telephone Encounter (Signed)
I have sent for 3 boxes to pharmacy.

## 2018-03-29 DIAGNOSIS — R269 Unspecified abnormalities of gait and mobility: Secondary | ICD-10-CM | POA: Diagnosis not present

## 2018-04-06 ENCOUNTER — Ambulatory Visit (INDEPENDENT_AMBULATORY_CARE_PROVIDER_SITE_OTHER): Payer: Medicare Other | Admitting: Orthopedic Surgery

## 2018-04-06 ENCOUNTER — Encounter (INDEPENDENT_AMBULATORY_CARE_PROVIDER_SITE_OTHER): Payer: Self-pay | Admitting: Orthopedic Surgery

## 2018-04-06 DIAGNOSIS — Z89432 Acquired absence of left foot: Secondary | ICD-10-CM

## 2018-04-06 DIAGNOSIS — E113413 Type 2 diabetes mellitus with severe nonproliferative diabetic retinopathy with macular edema, bilateral: Secondary | ICD-10-CM | POA: Diagnosis not present

## 2018-04-06 NOTE — Progress Notes (Signed)
Office Visit Note   Patient: David Lane           Date of Birth: [birth year]           MRN: [id number] Visit Date: 04/06/2018              Requested by: Dorena Cookey, MD [place], [place] [address] PCP: Dorena Cookey, MD  Chief Complaint  Patient presents with  . Left Foot - Routine Post Op, Follow-up      HPI: Patient is a 72 year old gentleman who is status post a left transmetatarsal amputation who presents in follow up for subungual hematoma on the right foot as well.  Assessment & Plan: Visit Diagnoses:  1. Status post transmetatarsal amputation of left foot ([place])     Plan: Continue antibiotic ointment dressing changes to the left foot ulcer. Follow with orthotist for shoe wear. Advance activities as tolerated.  Follow-Up Instructions: Return in about 2 months (around 06/07/2018).   Ortho Exam  Patient is alert, oriented, no adenopathy, well-dressed, normal affect, normal respiratory effort. Examination patient has subungual hematoma of the right great toe secondary to blunt trauma.  No drainage. No erythema. No sign of infection. Left transmetatarsal amputation is nearly healed. No swelling does have a 2 mm in diameter area that has yet to heal centrally, this is filled in with exudative tissue. No depth. No drainage. No erythema. Antibiotic ointment and a Band-Aid were applied.   Imaging: No results found. No images are attached to the encounter.  Labs: Lab Results  Component Value Date   HGBA1C 10.0 (H) 12/21/2017   HGBA1C 10.0 (H) 12/20/2017   HGBA1C 9.2 06/08/2017   ESRSEDRATE 105 (H) 01/13/2018   CRP 19.9 (H) 01/13/2018   REPTSTATUS 01/21/2018 FINAL 01/14/2018   GRAMSTAIN  01/14/2018    ABUNDANT WBC PRESENT, PREDOMINANTLY PMN MODERATE GRAM NEGATIVE RODS FEW GRAM POSITIVE COCCI IN PAIRS    CULT  01/14/2018    MODERATE ESCHERICHIA COLI MODERATE ENTEROCOCCUS FAECALIS ABUNDANT BACTEROIDES FRAGILIS BETA LACTAMASE  POSITIVE Performed at [facility], [place] [address], [place], [place] [address]    LABORGA ESCHERICHIA COLI 01/14/2018   LABORGA ENTEROCOCCUS FAECALIS 01/14/2018     Lab Results  Component Value Date   ALBUMIN 2.1 (L) 01/21/2018   ALBUMIN 2.3 (L) 01/12/2018   ALBUMIN 3.1 (L) 12/20/2017   PREALBUMIN <5 (L) 01/13/2018    There is no height or weight on file to calculate BMI.  Orders:  No orders of the defined types were placed in this encounter.  No orders of the defined types were placed in this encounter.    Procedures: No procedures performed  Clinical Data: No additional findings.  ROS:  All other systems negative, except as noted in the HPI. Review of Systems  Constitutional: Negative for chills and fever.  Cardiovascular: Negative for leg swelling.  Skin: Positive for wound.    Objective: Vital Signs: There were no vitals taken for this visit.  Specialty Comments:  No specialty comments available.  PMFS History: Patient Active Problem List   Diagnosis Date Noted  . Labile blood glucose   . Hypoglycemia   . Functional gait disorder 01/20/2018  . Acute blood loss anemia 01/20/2018  . Status post transmetatarsal amputation of left foot ([place]) 01/20/2018  . Foot ulcer due to secondary DM ([place]) 01/04/2018  . Status post coronary artery stent placement   . Acute ST elevation myocardial infarction (STEMI) involving left anterior descending (LAD)  coronary artery ([place]) 12/20/2017  . 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 Wellness examination 11/25/2014  . Screening for prostate cancer 11/25/2014  . Pancreatic insufficiency 01/25/2013  . Ischemic cardiomyopathy, EF 50% by echo 08/01/13 07/19/2012  . Chronic combined systolic and diastolic CHF (congestive heart failure) ([place]) 07/14/2012  . Pulmonary edema, most likely due to diastolic dysfunction 67/89/3810  . CAD, 07/14/12- LAD/PDA DES   . ANXIETY 12/12/2009  .  Hyperlipidemia 04/24/2007  . Essential hypertension 04/24/2007  . PANCREATITIS, HX OF 04/24/2007   Past Medical History:  Diagnosis Date  . CAD (coronary artery disease)    last cath by [facility] DR.  Mihai Croitoru showing  some  disease involving LCX and small size of Diag   . CHF exacerbation, due to diastolic dysfunction 17/51/0258  . Chronic systolic CHF (congestive heart failure), NYHA class 1 ([place]) 07/17/2012  . GERD (gastroesophageal reflux disease)   . Hepatic lesion 02/04/11  . Hyperlipemia   . Hypertension   . Ischemic cardiomyopathy    EF 35-40%  . Liver hemangioma   . NSTEMI (non-ST elevated myocardial infarction) ([place]) 11/21/2009  . Pancreatitis 2000's  . Shortness of breath   . Type II diabetes mellitus (HCC)     Family History  Problem Relation Age of Onset  . Diabetes Mother   . Diabetes Paternal Grandmother   . Diabetes Maternal Grandfather   . Colon cancer Neg Hx     Past Surgical History:  Procedure Laterality Date  . AMPUTATION Left 01/14/2018   Procedure: TRANSMETATARSAL AMPUTATION;  Surgeon: Newt Minion, MD;  Location: [facility];  Service: Orthopedics;  Laterality: Left;  . CARDIAC CATHETERIZATION  2011   minimal disease, medical management  . CORONARY ANGIOPLASTY WITH STENT PLACEMENT  07/14/2012   successful PCI & stenting of mid LAD & PDA off the dominant CX  . CORONARY/GRAFT ACUTE MI REVASCULARIZATION N/A 12/20/2017   Procedure: Coronary/Graft Acute MI Revascularization;  Surgeon: Martinique, Peter M, MD;  Location: [place] [facility];  Service: Cardiovascular;  Laterality: N/A;  . INCISION AND DRAINAGE ABSCESS ANAL  1970's  . LEFT HEART CATH AND CORONARY ANGIOGRAPHY N/A 12/20/2017   Procedure: LEFT HEART CATH AND CORONARY ANGIOGRAPHY;  Surgeon: Martinique, Peter M, MD;  Location: [place] [facility];  Service: Cardiovascular;  Laterality: N/A;  . LEFT HEART CATHETERIZATION WITH CORONARY ANGIOGRAM N/A 07/14/2012   Procedure: LEFT HEART CATHETERIZATION WITH CORONARY  ANGIOGRAM;  Surgeon: [name], MD;  Location: [facility] CATH LAB;  Service: Cardiovascular;  Laterality: N/A;  . PERCUTANEOUS CORONARY STENT INTERVENTION (PCI-S) Right 07/14/2012   Procedure: PERCUTANEOUS CORONARY STENT INTERVENTION (PCI-S);  Surgeon: [name], MD;  Location: [facility] C[facility] LAB;  Service: Cardiovascular;  Laterality: Right;   Social History   Occupational History  . Occupation: retired    Fish farm manager: [employer]  Tobacco Use  . Smoking status: Never Smoker  . Smokeless tobacco: Never Used  Substance and Sexual Activity  . Alcohol use: No  . Drug use: No  . Sexual activity: Never

## 2018-04-11 ENCOUNTER — Ambulatory Visit (INDEPENDENT_AMBULATORY_CARE_PROVIDER_SITE_OTHER): Payer: Medicare Other | Admitting: Orthopaedic Surgery

## 2018-04-29 DIAGNOSIS — R269 Unspecified abnormalities of gait and mobility: Secondary | ICD-10-CM | POA: Diagnosis not present

## 2018-05-01 ENCOUNTER — Encounter (HOSPITAL_COMMUNITY): Payer: Self-pay | Admitting: Emergency Medicine

## 2018-05-01 ENCOUNTER — Emergency Department (HOSPITAL_COMMUNITY)
Admission: EM | Admit: 2018-05-01 | Discharge: 2018-05-01 | Disposition: A | Discharge status: Home / Self Care | Payer: Medicare Other | Attending: Emergency Medicine | Admitting: Emergency Medicine

## 2018-05-01 ENCOUNTER — Other Ambulatory Visit: Payer: Self-pay

## 2018-05-01 DIAGNOSIS — Z794 Long term (current) use of insulin: Secondary | ICD-10-CM | POA: Insufficient documentation

## 2018-05-01 DIAGNOSIS — R450 Nervousness: Secondary | ICD-10-CM | POA: Diagnosis not present

## 2018-05-01 DIAGNOSIS — I5042 Chronic combined systolic (congestive) and diastolic (congestive) heart failure: Secondary | ICD-10-CM | POA: Insufficient documentation

## 2018-05-01 DIAGNOSIS — I444 Left anterior fascicular block: Secondary | ICD-10-CM | POA: Diagnosis not present

## 2018-05-01 DIAGNOSIS — R5381 Other malaise: Secondary | ICD-10-CM | POA: Diagnosis present

## 2018-05-01 DIAGNOSIS — R110 Nausea: Secondary | ICD-10-CM

## 2018-05-01 DIAGNOSIS — E119 Type 2 diabetes mellitus without complications: Secondary | ICD-10-CM | POA: Diagnosis not present

## 2018-05-01 DIAGNOSIS — I2510 Atherosclerotic heart disease of native coronary artery without angina pectoris: Secondary | ICD-10-CM | POA: Insufficient documentation

## 2018-05-01 DIAGNOSIS — R001 Bradycardia, unspecified: Secondary | ICD-10-CM | POA: Diagnosis not present

## 2018-05-01 DIAGNOSIS — I110 Hypertensive heart disease with heart failure: Secondary | ICD-10-CM | POA: Insufficient documentation

## 2018-05-01 DIAGNOSIS — Z79899 Other long term (current) drug therapy: Secondary | ICD-10-CM | POA: Diagnosis not present

## 2018-05-01 DIAGNOSIS — Z7982 Long term (current) use of aspirin: Secondary | ICD-10-CM | POA: Diagnosis not present

## 2018-05-01 DIAGNOSIS — R000 Tachycardia, unspecified: Secondary | ICD-10-CM | POA: Diagnosis not present

## 2018-05-01 DIAGNOSIS — I10 Essential (primary) hypertension: Secondary | ICD-10-CM | POA: Diagnosis not present

## 2018-05-01 LAB — BASIC METABOLIC PANEL
ANION GAP: 11 (ref 5–15)
BUN: 30 mg/dL — ABNORMAL HIGH (ref 8–23)
CO2: 24 mmol/L (ref 22–32)
Calcium: 8.8 mg/dL — ABNORMAL LOW (ref 8.9–10.3)
Chloride: 101 mmol/L (ref 98–111)
Creatinine, Ser: 1.14 mg/dL (ref 0.61–1.24)
GFR calc Af Amer: >60 mL/min (ref >60)
GLUCOSE: 179 mg/dL — AB (ref 70–99)
POTASSIUM: 5.4 mmol/L — AB (ref 3.5–5.1)
Sodium: 136 mmol/L (ref 135–145)

## 2018-05-01 LAB — CBC WITH DIFFERENTIAL/PLATELET
Abs Immature Granulocytes: 0 10*3/uL (ref 0.0–0.1)
BASOS ABS: 0 10*3/uL (ref 0.0–0.1)
Basophils Relative: 0 %
EOS ABS: 0.1 10*3/uL (ref 0.0–0.7)
EOS PCT: 2 %
HEMATOCRIT: 40.7 % (ref 39.0–52.0)
Hemoglobin: 13.4 g/dL (ref 13.0–17.0)
Immature Granulocytes: 0 %
LYMPHS PCT: 30 %
Lymphs Abs: 2.2 10*3/uL (ref 0.7–4.0)
MCH: 30 pg (ref 26.0–34.0)
MCHC: 32.9 g/dL (ref 30.0–36.0)
MCV: 91.1 fL (ref 78.0–100.0)
Monocytes Absolute: 0.7 10*3/uL (ref 0.1–1.0)
Monocytes Relative: 9 %
NEUTROS ABS: 4.1 10*3/uL (ref 1.7–7.7)
Neutrophils Relative %: 59 %
Platelets: 250 10*3/uL (ref 150–400)
RBC: 4.47 MIL/uL (ref 4.22–5.81)
RDW: 13.1 % (ref 11.5–15.5)
WBC: 7.1 10*3/uL (ref 4.0–10.5)

## 2018-05-01 LAB — I-STAT TROPONIN, ED: TROPONIN I, POC: 0.01 ng/mL (ref 0.00–0.08)

## 2018-05-01 LAB — POTASSIUM: POTASSIUM: 3.9 mmol/L (ref 3.5–5.1)

## 2018-05-01 NOTE — ED Notes (Signed)
Blood collected and sent to lab for K+

## 2018-05-01 NOTE — ED Notes (Signed)
Patient verbalizes understanding of medications and discharge instructions. No further questions at this time. VSS and patient ambulatory at discharge.   

## 2018-05-01 NOTE — ED Notes (Signed)
Denies nausea, denies vomitting

## 2018-05-01 NOTE — ED Notes (Signed)
Denies chest pain 

## 2018-05-01 NOTE — ED Provider Notes (Signed)
[facility] EMERGENCY DEPARTMENT Provider Note   CSN: [id number] Arrival date & time: 05/01/18  1430     History   Chief Complaint Chief Complaint  Patient presents with  . SICK/DIAPHORETIC    HPI David Lane is a 72 y.o. male.  72yo M w/ PMH including CAD s/p stenting, CHF, HTN, IDDM, HLD, pancreatitis who p/w  Elevated blood pressure and feeling not right. He got up this morning feeling well, BP was normal and he took his usual morning meds and ate breakfast. Later in the day near lunch, he "barely" had nausea, checked his BG which was ok. He checked his BP and it was 182 systolic and later 993 diastolic. He later noticed that he had some sweat on his sides of his shirt. He called EMS because he was concerned. By the time he arrived to the ER, he felt fine and his BP had improved. No chest pain/pressure, SOB, or other complaints. He does note he is very paranoid about his 3rd stent; he had 2 stents cleaned out in March and they noted some abnormalities on the 3rd stent but did not clean it out. He is concerned that this stent could cause problems in the future.   The history is provided by the patient.    Past Medical History:  Diagnosis Date  . CAD (coronary artery disease)    last cath by [facility] DR.  Mihai Croitoru showing  some  disease involving LCX and small size of Diag   . CHF exacerbation, due to diastolic dysfunction 71/69/6789  . Chronic systolic CHF (congestive heart failure), NYHA class 1 ([place]) 07/17/2012  . GERD (gastroesophageal reflux disease)   . Hepatic lesion 02/04/11  . Hyperlipemia   . Hypertension   . Ischemic cardiomyopathy    EF 35-40%  . Liver hemangioma   . NSTEMI (non-ST elevated myocardial infarction) ([place]) 11/21/2009  . Pancreatitis 2000's  . Shortness of breath   . Type II diabetes mellitus [facility])     Patient Active Problem List   Diagnosis Date Noted  . Labile blood glucose   . Hypoglycemia   . Functional gait disorder  01/20/2018  . Acute blood loss anemia 01/20/2018  . Status post transmetatarsal amputation of left foot ([place]) 01/20/2018  . Foot ulcer due to secondary DM ([place]) 01/04/2018  . Status post coronary artery stent placement   . Acute ST elevation myocardial infarction (STEMI) involving left anterior descending (LAD) coronary artery ([place]) 12/20/2017  . 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 Wellness examination 11/25/2014  . Screening for prostate cancer 11/25/2014  . Pancreatic insufficiency 01/25/2013  . Ischemic cardiomyopathy, EF 50% by echo 08/01/13 07/19/2012  . Chronic combined systolic and diastolic CHF (congestive heart failure) ([place]) 07/14/2012  . Pulmonary edema, most likely due to diastolic dysfunction 38/06/1750  . CAD, 07/14/12- LAD/PDA DES   . ANXIETY 12/12/2009  . Hyperlipidemia 04/24/2007  . Essential hypertension 04/24/2007  . PANCREATITIS, HX OF 04/24/2007    Past Surgical History:  Procedure Laterality Date  . AMPUTATION Left 01/14/2018   Procedure: TRANSMETATARSAL AMPUTATION;  Surgeon: Newt Minion, MD;  Location: [facility];  Service: Orthopedics;  Laterality: Left;  . CARDIAC CATHETERIZATION  2011   minimal disease, medical management  . CORONARY ANGIOPLASTY WITH STENT PLACEMENT  07/14/2012   successful PCI & stenting of mid LAD & PDA off the dominant CX  . CORONARY/GRAFT ACUTE MI REVASCULARIZATION N/A 12/20/2017   Procedure: Coronary/Graft Acute MI Revascularization;  Surgeon: Martinique, Peter M, MD;  Location: [facility] CV LAB;  Service: Cardiovascular;  Laterality: N/A;  . INCISION AND DRAINAGE ABSCESS ANAL  1970's  . LEFT HEART CATH AND CORONARY ANGIOGRAPHY N/A 12/20/2017   Procedure: LEFT HEART CATH AND CORONARY ANGIOGRAPHY;  Surgeon: Martinique, Peter M, MD;  Location: [facility] CV LAB;  Service: Cardiovascular;  Laterality: N/A;  . LEFT HEART CATHETERIZATION WITH CORONARY ANGIOGRAM N/A 07/14/2012   Procedure: LEFT HEART  CATHETERIZATION WITH CORONARY ANGIOGRAM;  Surgeon: [name], MD;  Location: [facility] CATH LAB;  Service: Cardiovascular;  Laterality: N/A;  . PERCUTANEOUS CORONARY STENT INTERVENTION (PCI-S) Right 07/14/2012   Procedure: PERCUTANEOUS CORONARY STENT INTERVENTION (PCI-S);  Surgeon: [name], MD;  Location: [facility] C[facility] LAB;  Service: Cardiovascular;  Laterality: Right;        Home Medications    Prior to Admission medications   Medication Sig Start Date End Date Taking? Authorizing Provider  aspirin 81 MG tablet Take 81 mg by mouth daily.     Yes [provider]  carvedilol (COREG) 3.125 MG tablet Take 1 tablet (3.125 mg total) by mouth 2 (two) times daily. 02/08/18  Yes Lendon Colonel, NP  HUMALOG 100 UNIT/ML injection Inject 7 Units into the skin 3 (three) times daily with meals. Per sliding scale 01/30/18  Yes [provider]  Insulin Glargine (LANTUS SOLOSTAR) 100 UNIT/ML Solostar Pen INJECT 85 UNITS SUBCUTANEOUSLY DAILY AT 10 PM**APPOINTMENT NEEDED FOR FURTHER REFILLS** Patient taking differently: Inject 65 Units into the skin daily.  03/28/18  Yes Renato Shin, MD  lisinopril (PRINIVIL,ZESTRIL) 20 MG tablet Take 20 mg by mouth daily. 03/22/18  Yes [provider]  Multiple Vitamins-Minerals (CENTRUM SILVER ULTRA MENS PO) Take 1 tablet by mouth daily.    Yes [provider]  ofloxacin (OCUFLOX) 0.3 % ophthalmic solution Place 1 drop into the left eye 4 (four) times daily. 03/07/18  Yes [provider]  pantoprazole (PROTONIX) 40 MG tablet Take 1 tablet (40 mg total) by mouth daily. 01/27/18  Yes Love, Ivan Anchors, PA-C  prednisoLONE acetate (PRED FORTE) 1 % ophthalmic suspension Place 1 drop into the left eye 4 (four) times daily. 03/07/18  Yes [provider]  rosuvastatin (CRESTOR) 40 MG tablet Take 1 tablet (40 mg total) by mouth at bedtime. 03/22/18  Yes Croitoru, Mihai, MD  ticagrelor (BRILINTA) 90 MG TABS tablet Take 1 tablet (90 mg  total) by mouth 2 (two) times daily. 01/20/18  Yes Robbie Lis, MD  glucose blood (ACCU-CHEK AVIVA) test strip 4 (four) times daily. DX code e11.9 12/25/17   Daune Perch, NP  insulin aspart (NOVOLOG) 100 UNIT/ML injection Inject 7 Units into the skin 3 (three) times daily with meals. Patient not taking: Reported on 05/01/2018 01/28/18   Love, Ivan Anchors, PA-C  Insulin Pen Needle (B-D UF III MINI PEN NEEDLES) 31G X 5 MM MISC use to inject insulin daily 12/25/17   Daune Perch, NP    Family History Family History  Problem Relation Age of Onset  . Diabetes Mother   . Diabetes Paternal Grandmother   . Diabetes Maternal Grandfather   . Colon cancer Neg Hx     Social History Social History   Tobacco Use  . Smoking status: Never Smoker  . Smokeless tobacco: Never Used  Substance Use Topics  . Alcohol use: No  . Drug use: No     Allergies   Lipitor [atorvastatin] and Metformin and related   Review of Systems Review of Systems  All other systems reviewed and are negative except that which was mentioned in HPI   Physical Exam Updated Vital Signs BP (!) 142/77   Pulse 78   Temp 98.1 F (36.7 C) (Oral)   Resp 20   SpO2 99%   Physical Exam  Constitutional: He is oriented to person, place, and time. He appears well-developed and well-nourished. No distress.  HENT:  Head: Normocephalic and atraumatic.  Moist mucous membranes  Eyes: Conjunctivae are normal.  Neck: Neck supple.  Cardiovascular: Normal rate, regular rhythm and normal heart sounds.  No murmur heard. Pulmonary/Chest: Effort normal and breath sounds normal.  Abdominal: Soft. Bowel sounds are normal. He exhibits no distension. There is no tenderness.  Musculoskeletal: He exhibits no edema.  R foot partial amputation  Neurological: He is alert and oriented to person, place, and time.  Fluent speech  Skin: Skin is warm and dry.  Psychiatric: Judgment normal.  anxious  Nursing note and vitals  reviewed.    ED Treatments / Results  Labs (all labs ordered are listed, but only abnormal results are displayed) Labs Reviewed  BASIC METABOLIC PANEL - Abnormal; Notable for the following components:      Result Value   Potassium 5.4 (*)    Glucose, Bld 179 (*)    BUN 30 (*)    Calcium 8.8 (*)    All other components within normal limits  CBC WITH DIFFERENTIAL/PLATELET  POTASSIUM  I-STAT TROPONIN, ED    EKG EKG Interpretation  Date/Time:  Monday May 01 2018 14:47:26 EDT Ventricular Rate:  85 PR Interval:    QRS Duration: 90 QT Interval:  368 QTC Calculation: 438 R Axis:   -68 Text Interpretation:  Sinus rhythm Prolonged PR interval LAD, consider left anterior fascicular block Anterior infarct, old previous T wave abnormalities in precordial leads have resolved Confirmed by Theotis Burrow [phone]) on 05/01/2018 3:13:35 PM   Radiology No results found.  Procedures Procedures (including critical care time)  Medications Ordered in ED Medications - No data to display   Initial Impression / Assessment and Plan / ED Course  I have reviewed the triage vital signs and the nursing notes.  Pertinent labs & imaging results that were available during my care of the patient were reviewed by me and considered in my medical decision making (see chart for details).     Pt well appearing on exam, very anxious. EKG reassuring. He had no chest pain, SOB or any other complaints.  He has admitted several times that he is very anxious over his previous history and is not sure what to make of his blood pressure when it is normal in the morning and continues to rise as he checks it multiple times throughout the day.  His lab work here is reassuring, negative troponin and normal basic labs.  Because his main concern was elevation in BP, I do not feel he requires serial troponins.  Have emphasized the importance of continuing his home medications and only checking his blood pressure once  daily.  He will follow-up with his PCP and cardiologist.  Reviewed return precautions and he voiced understanding.  Final Clinical Impressions(s) / ED Diagnoses   Final diagnoses:  Nausea without vomiting    ED Discharge Orders    None       Arraya Buck, Wenda Overland, MD 05/01/18 [phone]

## 2018-05-01 NOTE — ED Triage Notes (Signed)
Patient arrived from home by EMS. He reports feeling "sick" and his blood pressure went up. He said he was concerned because a few months ago "they cleaned my stent, and put some more."  He takes [place] as ordered EMS reports that upon arrival, patient was diaphoretic, initial BP was high.

## 2018-05-04 DIAGNOSIS — S98312A Complete traumatic amputation of left midfoot, initial encounter: Secondary | ICD-10-CM | POA: Diagnosis not present

## 2018-05-15 ENCOUNTER — Ambulatory Visit: Payer: Medicare Other | Admitting: Cardiovascular Disease

## 2018-05-30 DIAGNOSIS — R269 Unspecified abnormalities of gait and mobility: Secondary | ICD-10-CM | POA: Diagnosis not present

## 2018-06-08 ENCOUNTER — Ambulatory Visit (INDEPENDENT_AMBULATORY_CARE_PROVIDER_SITE_OTHER): Payer: Medicare Other | Admitting: Physician Assistant

## 2018-06-08 ENCOUNTER — Encounter (INDEPENDENT_AMBULATORY_CARE_PROVIDER_SITE_OTHER): Payer: Self-pay | Admitting: Orthopedic Surgery

## 2018-06-08 VITALS — Ht 72.0 in | Wt 192.0 lb

## 2018-06-08 DIAGNOSIS — Z89432 Acquired absence of left foot: Secondary | ICD-10-CM

## 2018-06-09 NOTE — Progress Notes (Signed)
Office Visit Note   Patient: David Lane           Date of Birth: [birth year]           MRN: [id number] Visit Date: 06/08/2018              Requested by: Dorena Cookey, MD [place], [place] [id number] PCP: Dorena Cookey, MD  Chief Complaint  Patient presents with  . Left Foot - Follow-up      HPI: Patient is a 72 year old male who presents for postoperative follow-up following left transmetatarsal amputation on 04/06/2018.  He is ambulatory with shoe with upright bracing and spacer.  He has no pain over the area.  He has had no residual swelling.  He is pleased with his improvement with regards to his transmetatarsal amputation but does report that he still feels weak following his multiple medical issues over the past year.  He reports he is having to try to push himself to really get into better shape and better conditioning.  Encouraged patient to try to work out a plan to gradually improve his activity level.  He reports he has a tendency to sit around a lot and we discussed working on shorter timeframes of being up and ambulatory even just 10 minutes several times a day to gradually improve his overall strength and mobility.  Assessment & Plan: Visit Diagnoses:  1. Status post transmetatarsal amputation of left foot ([place])     Plan: Patient will work on his mobility with increasing his ambulation with his walker.  He reports he has a home rehab program and I encouraged him to continue to work on these activities to try to improve his strength and endurance.  He will follow-up in approximately 4 weeks or sooner should he have any difficulties in the interim.  Follow-Up Instructions: Return in about 4 weeks (around 07/06/2018).   Ortho Exam  Patient is alert, oriented, no adenopathy, well-dressed, normal affect, normal respiratory effort. The left transverse metatarsal amputation site is well-healed.  There is no edema over the area.  There is no  ulcerations.  No signs of cellulitis.  He has good dorsalis pedis pedal pulse.  He is ambulatory with shoe with spacer and bracing and walker. Imaging: No results found. No images are attached to the encounter.  Labs: Lab Results  Component Value Date   HGBA1C 10.0 (H) 12/21/2017   HGBA1C 10.0 (H) 12/20/2017   HGBA1C 9.2 06/08/2017   ESRSEDRATE 105 (H) 01/13/2018   CRP 19.9 (H) 01/13/2018   REPTSTATUS 01/21/2018 FINAL 01/14/2018   GRAMSTAIN  01/14/2018    ABUNDANT WBC PRESENT, PREDOMINANTLY PMN MODERATE GRAM NEGATIVE RODS FEW GRAM POSITIVE COCCI IN PAIRS    CULT  01/14/2018    MODERATE ESCHERICHIA COLI MODERATE ENTEROCOCCUS FAECALIS ABUNDANT BACTEROIDES FRAGILIS BETA LACTAMASE POSITIVE Performed at [facility], [place] [address] [place],  [address]    LABORGA ESCHERICHIA COLI 01/14/2018   LABORGA ENTEROCOCCUS FAECALIS 01/14/2018     Lab Results  Component Value Date   ALBUMIN 2.1 (L) 01/21/2018   ALBUMIN 2.3 (L) 01/12/2018   ALBUMIN 3.1 (L) 12/20/2017   PREALBUMIN <5 (L) 01/13/2018    Body mass index is 26.04 kg/m.  Orders:  No orders of the defined types were placed in this encounter.  No orders of the defined types were placed in this encounter.    Procedures: No procedures performed  Clinical Data: No additional findings.  ROS:  All other systems negative, except as noted in the HPI. Review of Systems  Objective: Vital Signs: Ht 6' (1.829 m)   Wt 192 lb (87.1 kg)   BMI 26.04 kg/m   Specialty Comments:  No specialty comments available.  PMFS History: Patient Active Problem List   Diagnosis Date Noted  . Labile blood glucose   . Hypoglycemia   . Functional gait disorder 01/20/2018  . Acute blood loss anemia 01/20/2018  . Status post transmetatarsal amputation of left foot ([place]) 01/20/2018  . Foot ulcer due to secondary DM ([place]) 01/04/2018  . Status post coronary artery stent placement   . Acute ST elevation myocardial  infarction (STEMI) involving left anterior descending (LAD) coronary artery ([place]) 12/20/2017  . 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 Wellness examination 11/25/2014  . Screening for prostate cancer 11/25/2014  . Pancreatic insufficiency 01/25/2013  . Ischemic cardiomyopathy, EF 50% by echo 08/01/13 07/19/2012  . Chronic combined systolic and diastolic CHF (congestive heart failure) ([place]) 07/14/2012  . Pulmonary edema, most likely due to diastolic dysfunction 56/38/7564  . CAD, 07/14/12- LAD/PDA DES   . ANXIETY 12/12/2009  . Hyperlipidemia 04/24/2007  . Essential hypertension 04/24/2007  . PANCREATITIS, HX OF 04/24/2007   Past Medical History:  Diagnosis Date  . CAD (coronary artery disease)    last cath by [facility] DR.  Mihai Croitoru showing  some  disease involving LCX and small size of Diag   . CHF exacerbation, due to diastolic dysfunction 33/29/5188  . Chronic systolic CHF (congestive heart failure), NYHA class 1 ([place]) 07/17/2012  . GERD (gastroesophageal reflux disease)   . Hepatic lesion 02/04/11  . Hyperlipemia   . Hypertension   . Ischemic cardiomyopathy    EF 35-40%  . Liver hemangioma   . NSTEMI (non-ST elevated myocardial infarction) ([place]) 11/21/2009  . Pancreatitis 2000's  . Shortness of breath   . Type II diabetes mellitus (H[facility])     Family History  Problem Relation Age of Onset  . Diabetes Mother   . Diabetes Paternal Grandmother   . Diabetes Maternal Grandfather   . Colon cancer Neg Hx     Past Surgical History:  Procedure Laterality Date  . AMPUTATION Left 01/14/2018   Procedure: TRANSMETATARSAL AMPUTATION;  Surgeon: Newt Minion, MD;  Location: [place];  Service: Orthopedics;  Laterality: Left;  . CARDIAC CATHETERIZATION  2011   minimal disease, medical management  . CORONARY ANGIOPLASTY WITH STENT PLACEMENT  07/14/2012   successful PCI & stenting of mid LAD & PDA off the dominant CX  . CORONARY/GRAFT ACUTE MI  REVASCULARIZATION N/A 12/20/2017   Procedure: Coronary/Graft Acute MI Revascularization;  Surgeon: Martinique, Peter M, MD;  Location: [place] [facility];  Service: Cardiovascular;  Laterality: N/A;  . INCISION AND DRAINAGE ABSCESS ANAL  1970's  . LEFT HEART CATH AND CORONARY ANGIOGRAPHY N/A 12/20/2017   Procedure: LEFT HEART CATH AND CORONARY ANGIOGRAPHY;  Surgeon: Martinique, Peter M, MD;  Location: [place] [facility];  Service: Cardiovascular;  Laterality: N/A;  . LEFT HEART CATHETERIZATION WITH CORONARY ANGIOGRAM N/A 07/14/2012   Procedure: LEFT HEART CATHETERIZATION WITH CORONARY ANGIOGRAM;  Surgeon: [name], MD;  Location: [facility] CATH LAB;  Service: Cardiovascular;  Laterality: N/A;  . PERCUTANEOUS CORONARY STENT INTERVENTION (PCI-S) Right 07/14/2012   Procedure: PERCUTANEOUS CORONARY STENT INTERVENTION (PCI-S);  Surgeon: [name], MD;  Location: [facility] CATH LAB;  Service: Cardiovascular;  Laterality: Right;   Social History   Occupational History  . Occupation: retired  Employer: [employer]  Tobacco Use  . Smoking status: Never Smoker  . Smokeless tobacco: Never Used  Substance and Sexual Activity  . Alcohol use: No  . Drug use: No  . Sexual activity: Never

## 2018-06-13 ENCOUNTER — Encounter (INDEPENDENT_AMBULATORY_CARE_PROVIDER_SITE_OTHER): Payer: Self-pay | Admitting: Physician Assistant

## 2018-06-29 ENCOUNTER — Encounter: Payer: Self-pay | Admitting: Cardiovascular Disease

## 2018-06-29 ENCOUNTER — Ambulatory Visit: Payer: Medicare Other | Admitting: Cardiovascular Disease

## 2018-06-29 VITALS — BP 110/70 | HR 88 | Ht 72.0 in | Wt 198.0 lb

## 2018-06-29 DIAGNOSIS — I2510 Atherosclerotic heart disease of native coronary artery without angina pectoris: Secondary | ICD-10-CM | POA: Diagnosis not present

## 2018-06-29 DIAGNOSIS — I10 Essential (primary) hypertension: Secondary | ICD-10-CM | POA: Diagnosis not present

## 2018-06-29 DIAGNOSIS — E108 Type 1 diabetes mellitus with unspecified complications: Secondary | ICD-10-CM

## 2018-06-29 DIAGNOSIS — R269 Unspecified abnormalities of gait and mobility: Secondary | ICD-10-CM | POA: Diagnosis not present

## 2018-06-29 DIAGNOSIS — K8681 Exocrine pancreatic insufficiency: Secondary | ICD-10-CM

## 2018-06-29 DIAGNOSIS — E785 Hyperlipidemia, unspecified: Secondary | ICD-10-CM | POA: Diagnosis not present

## 2018-06-29 DIAGNOSIS — I5042 Chronic combined systolic (congestive) and diastolic (congestive) heart failure: Secondary | ICD-10-CM

## 2018-06-29 NOTE — Patient Instructions (Addendum)
Dr Croitoru recommends that you schedule a follow-up appointment in 3 months.  If you need a refill on your cardiac medications before your next appointment, please call your pharmacy. 

## 2018-06-29 NOTE — Progress Notes (Signed)
Patient ID: David Lane, male   DOB: [birth year], 72 y.o.   MRN: [id number] Patient ID: David Lane, male   DOB: 01-11-1946, 72 y.o.   MRN: [id number]    Cardiology Office Note    Date:  07/01/2018   ID:  David Lane, David Lane [birth year], MRN [id number]  PCP:  Dorena Cookey, MD  Cardiologist:   [name], MD   chief complaint: CAD  History of Present Illness:  David Lane is a 73 y.o. male with severe systemic hypertension, CAD, CHF due to ischemic cardiomyopathy (EF 35-40% echo 12/21/2017), PAD, brittle type 1 diabetes mellitus following an episode of severe pancreatitis that caused both endocrine and exocrine pancreatic insufficiency.  David Lane had an acute anterior ST segment elevation myocardial infarction March 2019, found to have total occlusion of the mid LAD artery in the vicinity of a previously placed stent, treated with emergency angioplasty and placement of a new drug-eluting stent (12/20/2017, SYNERGY 2.5X32).  Also had 80% focal restenosis in the previously placed PDA stent and 75% focal stenosis distal to the stent and 95% stenosis in a small nondominant RCA.Marland Kitchen  He describes his angina has full fingers pushing and on the middle of the sternum.  Angina has not recurred since the procedure.  He then had a infection of his left foot with evidence of rapidly progressing soft tissue and bone involvement and sepsis and underwent a transmetatarsal amputation in April 2019.  Subsequently spent a couple of weeks and rehab but has not required  hospitalization since.  He had some bleeding complications of retinopathy and required injections and laser surgery to his left eye.  His vision problems have improved.  The patient specifically denies any chest pain at rest exertion, dyspnea at rest or with exertion, orthopnea, paroxysmal nocturnal dyspnea, syncope, palpitations, focal neurological deficits, intermittent claudication, lower extremity edema, unexplained weight gain,  cough, hemoptysis or wheezing.  No bleeding problems.  Is generally doing well and has reasonably good stability despite the amputation.  He has a detailed record of his blood pressure which has been 93/63-128/74, usually in the 110/60 range.  Diabetes control is poor with a hemoglobin A1c in excess of 10.  States that recently control has improved he is taking not only long-acting, but also a sliding scale intermediate acting insulin with meals.  LDL-C in the desirable range.  He does not smoke.  Trying to get better with diet, avoids eating out.   Past Medical History:  Diagnosis Date  . CAD (coronary artery disease)    last cath by [facility] DR.  Jourdan Maldonado showing  some  disease involving LCX and small size of Diag   . CHF exacerbation, due to diastolic dysfunction 92/07/9416  . Chronic systolic CHF (congestive heart failure), NYHA class 1 ([place]) 07/17/2012  . GERD (gastroesophageal reflux disease)   . Hepatic lesion 02/04/11  . Hyperlipemia   . Hypertension   . Ischemic cardiomyopathy    EF 35-40%  . Liver hemangioma   . NSTEMI (non-ST elevated myocardial infarction) ([place]) 11/21/2009  . Pancreatitis 2000's  . Shortness of breath   . Type II diabetes mellitus ([place])     Past Surgical History:  Procedure Laterality Date  . AMPUTATION Left 01/14/2018   Procedure: TRANSMETATARSAL AMPUTATION;  Surgeon: Newt Minion, MD;  Location: [facility];  Service: Orthopedics;  Laterality: Left;  . CARDIAC CATHETERIZATION  2011   minimal disease, medical management  . CORONARY ANGIOPLASTY WITH STENT PLACEMENT  07/14/2012  successful PCI & stenting of mid LAD & PDA off the dominant CX  . CORONARY/GRAFT ACUTE MI REVASCULARIZATION N/A 12/20/2017   Procedure: Coronary/Graft Acute MI Revascularization;  Surgeon: Martinique, Peter M, MD;  Location: [place] [facility];  Service: Cardiovascular;  Laterality: N/A;  . INCISION AND DRAINAGE ABSCESS ANAL  1970's  . LEFT HEART CATH AND CORONARY ANGIOGRAPHY N/A  12/20/2017   Procedure: LEFT HEART CATH AND CORONARY ANGIOGRAPHY;  Surgeon: Martinique, Peter M, MD;  Location: [place] [facility];  Service: Cardiovascular;  Laterality: N/A;  . LEFT HEART CATHETERIZATION WITH CORONARY ANGIOGRAM N/A 07/14/2012   Procedure: LEFT HEART CATHETERIZATION WITH CORONARY ANGIOGRAM;  Surgeon: [name], MD;  Location: [place] CATH LAB;  Service: Cardiovascular;  Laterality: N/A;  . PERCUTANEOUS CORONARY STENT INTERVENTION (PCI-S) Right 07/14/2012   Procedure: PERCUTANEOUS CORONARY STENT INTERVENTION (PCI-S);  Surgeon: [name], MD;  Location: [facility] CATH LAB;  Service: Cardiovascular;  Laterality: Right;    Outpatient Medications Prior to Visit  Medication Sig Dispense Refill  . aspirin 81 MG tablet Take 81 mg by mouth daily.      . carvedilol (COREG) 3.125 MG tablet Take 1 tablet (3.125 mg total) by mouth 2 (two) times daily.    Marland Kitchen glucose blood (ACCU-CHEK AVIVA) test strip 4 (four) times daily. DX code e11.9 100 each 0  . insulin aspart (NOVOLOG) 100 UNIT/ML injection Inject 7 Units into the skin 3 (three) times daily with meals. 10 mL 0  . Insulin Glargine (LANTUS SOLOSTAR) 100 UNIT/ML Solostar Pen INJECT 85 UNITS SUBCUTANEOUSLY DAILY AT 10 PM**APPOINTMENT NEEDED FOR FURTHER REFILLS** (Patient taking differently: Inject 55 Units into the skin daily. ) 15 pen 1  . Insulin Pen Needle (B-D UF III MINI PEN NEEDLES) 31G X 5 MM MISC use to inject insulin daily 100 each 0  . Multiple Vitamins-Minerals (CENTRUM SILVER ULTRA MENS PO) Take 1 tablet by mouth daily.     Marland Kitchen ofloxacin (OCUFLOX) 0.3 % ophthalmic solution Place 1 drop into the left eye 4 (four) times daily.  5  . pantoprazole (PROTONIX) 40 MG tablet Take 1 tablet (40 mg total) by mouth daily. 30 tablet 0  . prednisoLONE acetate (PRED FORTE) 1 % ophthalmic suspension Place 1 drop into the left eye 4 (four) times daily.  5  . rosuvastatin (CRESTOR) 40 MG tablet Take 1 tablet (40 mg total) by mouth at bedtime. 30 tablet 11    . ticagrelor (BRILINTA) 90 MG TABS tablet Take 1 tablet (90 mg total) by mouth 2 (two) times daily. 60 tablet 0  . HUMALOG 100 UNIT/ML injection Inject 7 Units into the skin 3 (three) times daily with meals. Per sliding scale  0  . lisinopril (PRINIVIL,ZESTRIL) 20 MG tablet Take 20 mg by mouth daily.  3   No facility-administered medications prior to visit.      Allergies:   Lipitor [atorvastatin] and Metformin and related   Social History   Socioeconomic History  . Marital status: Single    Spouse name: Not on file  . Number of children: 0  . Years of education: Not on file  . Highest education level: Not on file  Occupational History  . Occupation: retired    Fish farm manager: [employer]  Social Needs  . Financial resource strain: Not on file  . Food insecurity:    Worry: Not on file    Inability: Not on file  . Transportation needs:    Medical: Not on file    Non-medical: Not on  file  Tobacco Use  . Smoking status: Never Smoker  . Smokeless tobacco: Never Used  Substance and Sexual Activity  . Alcohol use: No  . Drug use: No  . Sexual activity: Never  Lifestyle  . Physical activity:    Days per week: Not on file    Minutes per session: Not on file  . Stress: Not on file  Relationships  . Social connections:    Talks on phone: Not on file    Gets together: Not on file    Attends religious service: Not on file    Active member of club or organization: Not on file    Attends meetings of clubs or organizations: Not on file    Relationship status: Not on file  Other Topics Concern  . Not on file  Social History Narrative  . Not on file     Family History:  The patient's family history includes Diabetes in his maternal grandfather, mother, and paternal grandmother.   ROS:   Please see the history of present illness.    ROS All other systems are reviewed and are negative  PHYSICAL EXAM:   VS:  BP 110/70 (BP Location: Right Arm, Patient Position: Sitting, Cuff Size:  Normal)   Pulse 88   Ht 6' (1.829 m)   Wt 198 lb (89.8 kg)   BMI 26.85 kg/m       General: Alert, oriented x3, no distress, presented with mood comfortable Head: no evidence of trauma, PERRL, EOMI, no exophtalmos or lid lag, no myxedema, no xanthelasma; normal ears, nose and oropharynx Neck: normal jugular venous pulsations and no hepatojugular reflux; brisk carotid pulses without delay and no carotid bruits Chest: clear to auscultation, no signs of consolidation by percussion or palpation, normal fremitus, symmetrical and full respiratory excursions Cardiovascular: normal position and quality of the apical impulse, regular rhythm, normal first and second heart sounds, no murmurs, rubs or gallops Abdomen: no tenderness or distention, no masses by palpation, no abnormal pulsatility or arterial bruits, normal bowel sounds, no hepatosplenomegaly Extremities: no clubbing, cyanosis or edema; transmetatarsal amputation left foot;  Neurological: grossly nonfocal Psych: Normal mood and affect  Wt Readings from Last 3 Encounters:  06/29/18 198 lb (89.8 kg)  06/08/18 192 lb (87.1 kg)  03/23/18 192 lb (87.1 kg)      Studies/Labs Reviewed:   EKG:  EKG is not ordered today.  05/04/2018 ECG shows normal sinus with borderline first-degree AV block, old anterior infarction, no acute repolarization changes  Lipid Panel    Component Value Date/Time   CHOL 102 12/21/2017 0345   TRIG 70 12/21/2017 0345   HDL 24 (L) 12/21/2017 0345   CHOLHDL 4.3 12/21/2017 0345   VLDL 14 12/21/2017 0345   LDLCALC 64 12/21/2017 0345    ASSESSMENT:    1. Chronic combined systolic and diastolic heart failure ([place])   2. Essential hypertension   3. Coronary artery disease involving native coronary artery of native heart without angina pectoris   4. Dyslipidemia (high LDL; low HDL)   5. Type 1 diabetes mellitus with complications (HCC)   6. Exocrine pancreatic insufficiency      PLAN:  In order of problems  listed above:  1. CHF: Euvolemic, functional class I, not requiring diuretics.  NYHA class I, euvolemic.  On beta blocker.  Blood pressure does not really permit additional meds. Mixed ischemic and hypertensive cardiomyopathy. Most recent assessment showed EF 35-40% , but this may have been partly due to post MI  stunning. 2. HTN: Spironolactone and ACE inhibitor have been stopped due to low blood pressure.  It's quite remarkable, considering how difficult his blood pressure was to control in the past.  I suspect he is now paying a lot more attention to his sodium intake than he did in the past. 3. CAD: Asymptomatic, no angina.  Continue dual antiplatelet therapy for his recently placed drug-eluting stent at least through March 2020.  He is at high risk for disease progression due to his diabetes and he may decide to keep him on Brilinta long-term. 4. HLP: On high-dose statin.  LDL cholesterol is excellent.  HDL was very low in March.  Even harder for him to exercise following the amputation. 5. DM: Type 1 diabetes due to pancreatic failure following acute pancreatitis, uncontrolled.  This remains his biggest risk factor for progression of vascular disease. 6. Pancreatic insufficiency might be contributing to less predictable absorptionof nutrients and medications; this may explain some of the volatility and glycemic control, volume status and blood pressure.  Creon is no longer listed amongst his medications.   Medication Adjustments/Labs and Tests Ordered: Current medicines are reviewed at length with the patient today.  Concerns regarding medicines are outlined above.  Medication changes, Labs and Tests ordered today are listed in the Patient Instructions below. Patient Instructions  Dr Sallyanne Kuster recommends that you schedule a follow-up appointment in 3 months.  If you need a refill on your cardiac medications before your next appointment, please call your pharmacy.   Signed, [name], MD    07/01/2018 9:36 AM    [facility] [place], [place], [place]  [address] Phone: [phone]; Fax: [phone]

## 2018-07-01 ENCOUNTER — Encounter: Payer: Self-pay | Admitting: Cardiovascular Disease

## 2018-07-01 DIAGNOSIS — E108 Type 1 diabetes mellitus with unspecified complications: Secondary | ICD-10-CM | POA: Insufficient documentation

## 2018-07-01 DIAGNOSIS — E785 Hyperlipidemia, unspecified: Secondary | ICD-10-CM | POA: Insufficient documentation

## 2018-07-06 ENCOUNTER — Ambulatory Visit (INDEPENDENT_AMBULATORY_CARE_PROVIDER_SITE_OTHER): Payer: Medicare Other | Admitting: Orthopedic Surgery

## 2018-07-06 ENCOUNTER — Encounter (INDEPENDENT_AMBULATORY_CARE_PROVIDER_SITE_OTHER): Payer: Self-pay | Admitting: Orthopedic Surgery

## 2018-07-06 VITALS — Ht 72.0 in | Wt 198.0 lb

## 2018-07-06 DIAGNOSIS — Z89432 Acquired absence of left foot: Secondary | ICD-10-CM | POA: Diagnosis not present

## 2018-07-06 NOTE — Progress Notes (Signed)
Office Visit Note   Patient: David Lane           Date of Birth: 08/20/1946           MRN: [id number] Visit Date: 07/06/2018              Requested by: Dorena Cookey, MD [place], [place] [id number] PCP: Dorena Cookey, MD  Chief Complaint  Patient presents with  . Left Foot - Follow-up      HPI: Patient is a 72 year old gentleman who is 6 months status post left transmetatarsal amputation patient complains of trouble with balance and back pain as well as occasional numbness of the dorsum of the stump.  Patient states that he feels like he still has instability with the double upright brace.  Assessment & Plan: Visit Diagnoses:  1. Status post transmetatarsal amputation of left foot ([place])     Plan: Patient is given a prescription for [employer] for a T-strap medially to hold his ankle over and provide him better support while in the brace.  If this does not work patient would need a hightop shoe with a double upright brace.  Discussed the importance of strength training to help improve his balance.  Follow-Up Instructions: Return if symptoms worsen or fail to improve.   Ortho Exam  Patient is alert, oriented, no adenopathy, well-dressed, normal affect, normal respiratory effort. Examination patient's left foot wound is well-healed the foot is plantigrade there is no ulcer no callus no signs of infection he has no varus or valgus malalignment.  Imaging: No results found. No images are attached to the encounter.  Labs: Lab Results  Component Value Date   HGBA1C 10.0 (H) 12/21/2017   HGBA1C 10.0 (H) 12/20/2017   HGBA1C 9.2 06/08/2017   ESRSEDRATE 105 (H) 01/13/2018   CRP 19.9 (H) 01/13/2018   REPTSTATUS 01/21/2018 FINAL 01/14/2018   GRAMSTAIN  01/14/2018    ABUNDANT WBC PRESENT, PREDOMINANTLY PMN MODERATE GRAM NEGATIVE RODS FEW GRAM POSITIVE COCCI IN PAIRS    CULT  01/14/2018    MODERATE ESCHERICHIA COLI MODERATE ENTEROCOCCUS  FAECALIS ABUNDANT BACTEROIDES FRAGILIS BETA LACTAMASE POSITIVE Performed at [facility], [place] [address], [place], [place] [address]    LABORGA ESCHERICHIA COLI 01/14/2018   LABORGA ENTEROCOCCUS FAECALIS 01/14/2018     Lab Results  Component Value Date   ALBUMIN 2.1 (L) 01/21/2018   ALBUMIN 2.3 (L) 01/12/2018   ALBUMIN 3.1 (L) 12/20/2017   PREALBUMIN <5 (L) 01/13/2018    Body mass index is 26.85 kg/m.  Orders:  No orders of the defined types were placed in this encounter.  No orders of the defined types were placed in this encounter.    Procedures: No procedures performed  Clinical Data: No additional findings.  ROS:  All other systems negative, except as noted in the HPI. Review of Systems  Objective: Vital Signs: Ht 6' (1.829 m)   Wt 198 lb (89.8 kg)   BMI 26.85 kg/m   Specialty Comments:  No specialty comments available.  PMFS History: Patient Active Problem List   Diagnosis Date Noted  . Type 1 diabetes mellitus with complications ([place]) 41/66/0630  . Dyslipidemia (high LDL; low HDL) 07/01/2018  . Labile blood glucose   . Hypoglycemia   . Functional gait disorder 01/20/2018  . Acute blood loss anemia 01/20/2018  . Status post transmetatarsal amputation of left foot ([place]) 01/20/2018  . Foot ulcer due to secondary DM ([place]) 01/04/2018  . Status  post coronary artery stent placement   . Acute ST elevation myocardial infarction (STEMI) involving left anterior descending (LAD) coronary artery ([place]) 12/20/2017  . 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 Wellness examination 11/25/2014  . Screening for prostate cancer 11/25/2014  . Pancreatic insufficiency 01/25/2013  . Ischemic cardiomyopathy, EF 50% by echo 08/01/13 07/19/2012  . Chronic combined systolic and diastolic CHF (congestive heart failure) ([place]) 07/14/2012  . Pulmonary edema, most likely due to diastolic dysfunction 50/35/4656  . CAD, 07/14/12- LAD/PDA  DES   . ANXIETY 12/12/2009  . Hyperlipidemia 04/24/2007  . Essential hypertension 04/24/2007  . PANCREATITIS, HX OF 04/24/2007   Past Medical History:  Diagnosis Date  . CAD (coronary artery disease)    last cath by [facility] DR.  Mihai Croitoru showing  some  disease involving LCX and small size of Diag   . CHF exacerbation, due to diastolic dysfunction 81/27/5170  . Chronic systolic CHF (congestive heart failure), NYHA class 1 ([place]) 07/17/2012  . GERD (gastroesophageal reflux disease)   . Hepatic lesion 02/04/11  . Hyperlipemia   . Hypertension   . Ischemic cardiomyopathy    EF 35-40%  . Liver hemangioma   . NSTEMI (non-ST elevated myocardial infarction) ([place]) 11/21/2009  . Pancreatitis 2000's  . Shortness of breath   . Type II diabetes mellitus ([facility])     Family History  Problem Relation Age of Onset  . Diabetes Mother   . Diabetes Paternal Grandmother   . Diabetes Maternal Grandfather   . Colon cancer Neg Hx     Past Surgical History:  Procedure Laterality Date  . AMPUTATION Left 01/14/2018   Procedure: TRANSMETATARSAL AMPUTATION;  Surgeon: Newt Minion, MD;  Location: [place];  Service: Orthopedics;  Laterality: Left;  . CARDIAC CATHETERIZATION  2011   minimal disease, medical management  . CORONARY ANGIOPLASTY WITH STENT PLACEMENT  07/14/2012   successful PCI & stenting of mid LAD & PDA off the dominant CX  . CORONARY/GRAFT ACUTE MI REVASCULARIZATION N/A 12/20/2017   Procedure: Coronary/Graft Acute MI Revascularization;  Surgeon: Martinique, Peter M, MD;  Location: [place] CV LAB;  Service: Cardiovascular;  Laterality: N/A;  . INCISION AND DRAINAGE ABSCESS ANAL  1970's  . LEFT HEART CATH AND CORONARY ANGIOGRAPHY N/A 12/20/2017   Procedure: LEFT HEART CATH AND CORONARY ANGIOGRAPHY;  Surgeon: Martinique, Peter M, MD;  Location: [place] [facility];  Service: Cardiovascular;  Laterality: N/A;  . LEFT HEART CATHETERIZATION WITH CORONARY ANGIOGRAM N/A 07/14/2012   Procedure: LEFT HEART  CATHETERIZATION WITH CORONARY ANGIOGRAM;  Surgeon: [name], MD;  Location: [place] CATH LAB;  Service: Cardiovascular;  Laterality: N/A;  . PERCUTANEOUS CORONARY STENT INTERVENTION (PCI-S) Right 07/14/2012   Procedure: PERCUTANEOUS CORONARY STENT INTERVENTION (PCI-S);  Surgeon: [name], MD;  Location: [facility] CATH LAB;  Service: Cardiovascular;  Laterality: Right;   Social History   Occupational History  . Occupation: retired    Fish farm manager: [employer]  Tobacco Use  . Smoking status: Never Smoker  . Smokeless tobacco: Never Used  Substance and Sexual Activity  . Alcohol use: No  . Drug use: No  . Sexual activity: Never

## 2018-07-30 DIAGNOSIS — R269 Unspecified abnormalities of gait and mobility: Secondary | ICD-10-CM | POA: Diagnosis not present

## 2018-08-08 ENCOUNTER — Other Ambulatory Visit: Payer: Self-pay | Admitting: Cardiology

## 2018-08-29 DIAGNOSIS — R269 Unspecified abnormalities of gait and mobility: Secondary | ICD-10-CM | POA: Diagnosis not present

## 2018-08-31 DIAGNOSIS — S98312A Complete traumatic amputation of left midfoot, initial encounter: Secondary | ICD-10-CM | POA: Diagnosis not present

## 2018-09-05 ENCOUNTER — Telehealth: Payer: Self-pay | Admitting: Endocrinology

## 2018-09-05 ENCOUNTER — Other Ambulatory Visit: Payer: Self-pay

## 2018-09-05 MED ORDER — GLUCOSE BLOOD VI STRP: ORAL_STRIP | 4 refills | Status: DC

## 2018-09-05 MED ORDER — INSULIN GLARGINE 100 UNIT/ML SOLOSTAR PEN: PEN_INJECTOR | SUBCUTANEOUS | 1 refills | Status: DC

## 2018-09-05 NOTE — Telephone Encounter (Signed)
MEDICATION: Insulin Glargine (LANTUS SOLOSTAR) 100 UNIT/ML Solostar Pen OneTouch Ultra BlueTest Strips  PHARMACY: Contacted. [id number]-SEND INSULIN HERE  [facility]- [address]SEND TEST STRIPS HERE  IS THIS A 90 DAY SUPPLY :   IS PATIENT OUT OF MEDICATION:   IF NOT; HOW MUCH IS LEFT: Out of Insulin but 12 left of strips  LAST APPOINTMENT DATE: @7 /10/2017  NEXT APPOINTMENT DATE:@1 /02/2019  DO WE HAVE YOUR PERMISSION TO LEAVE A DETAILED MESSAGE: Yes   OTHER COMMENTS:    **Let patient know to contact pharmacy at the end of the day to make sure medication is ready. **  ** Please notify patient to allow 48-72 hours to process**  **Encourage patient to contact the pharmacy for refills or they can request refills through [facility]**

## 2018-09-05 NOTE — Telephone Encounter (Signed)
This has been done.

## 2018-09-18 DIAGNOSIS — E113411 Type 2 diabetes mellitus with severe nonproliferative diabetic retinopathy with macular edema, right eye: Secondary | ICD-10-CM | POA: Diagnosis not present

## 2018-09-18 DIAGNOSIS — H4312 Vitreous hemorrhage, left eye: Secondary | ICD-10-CM | POA: Diagnosis not present

## 2018-09-21 ENCOUNTER — Other Ambulatory Visit: Payer: Self-pay | Admitting: Endocrinology

## 2018-09-21 NOTE — Telephone Encounter (Signed)
MEDICATION: Multiple Medicatons - See Below  One Touch Ultra Test Strips Novolog 100 Unit/ML  PHARMACY:  Please [facility] [address] :  no  IS PATIENT OUT OF MEDICATION: yes  IF NOT; HOW MUCH IS LEFT: no - test strips                                                    Maybe - Novolog  LAST APPOINTMENT DATE: @12 /06/2018  NEXT APPOINTMENT DATE:@1 /02/2019  DO WE HAVE YOUR PERMISSION TO LEAVE A DETAILED MESSAGE: yes  OTHER COMMENTS:    **Let patient know to contact pharmacy at the end of the day to make sure medication is ready. **  ** Please notify patient to allow 48-72 hours to process**  **Encourage patient to contact the pharmacy for refills or they can request refills through [facility]**

## 2018-09-25 ENCOUNTER — Other Ambulatory Visit: Payer: Self-pay

## 2018-09-25 DIAGNOSIS — E089 Diabetes mellitus due to underlying condition without complications: Secondary | ICD-10-CM

## 2018-09-25 DIAGNOSIS — K8689 Other specified diseases of pancreas: Principal | ICD-10-CM

## 2018-09-25 DIAGNOSIS — H4300 Vitreous prolapse, unspecified eye: Secondary | ICD-10-CM | POA: Diagnosis not present

## 2018-09-25 DIAGNOSIS — E108 Type 1 diabetes mellitus with unspecified complications: Secondary | ICD-10-CM | POA: Diagnosis not present

## 2018-09-25 MED ORDER — GLUCOSE BLOOD VI STRP: ORAL_STRIP | 6 refills | Status: DC

## 2018-09-25 MED ORDER — GLUCOSE BLOOD VI STRP: ORAL_STRIP | 11 refills | Status: DC

## 2018-09-29 DIAGNOSIS — E113412 Type 2 diabetes mellitus with severe nonproliferative diabetic retinopathy with macular edema, left eye: Secondary | ICD-10-CM | POA: Diagnosis not present

## 2018-09-29 DIAGNOSIS — E113413 Type 2 diabetes mellitus with severe nonproliferative diabetic retinopathy with macular edema, bilateral: Secondary | ICD-10-CM | POA: Diagnosis not present

## 2018-09-29 DIAGNOSIS — R269 Unspecified abnormalities of gait and mobility: Secondary | ICD-10-CM | POA: Diagnosis not present

## 2018-10-02 ENCOUNTER — Ambulatory Visit (INDEPENDENT_AMBULATORY_CARE_PROVIDER_SITE_OTHER): Payer: Medicare Other | Admitting: Endocrinology

## 2018-10-02 ENCOUNTER — Encounter: Payer: Self-pay | Admitting: Endocrinology

## 2018-10-02 VITALS — BP 108/68 | HR 68 | Ht 72.0 in | Wt 196.8 lb

## 2018-10-02 DIAGNOSIS — H4312 Vitreous hemorrhage, left eye: Secondary | ICD-10-CM | POA: Diagnosis not present

## 2018-10-02 DIAGNOSIS — K8689 Other specified diseases of pancreas: Secondary | ICD-10-CM | POA: Diagnosis not present

## 2018-10-02 DIAGNOSIS — E089 Diabetes mellitus due to underlying condition without complications: Secondary | ICD-10-CM

## 2018-10-02 DIAGNOSIS — E113412 Type 2 diabetes mellitus with severe nonproliferative diabetic retinopathy with macular edema, left eye: Secondary | ICD-10-CM | POA: Diagnosis not present

## 2018-10-02 DIAGNOSIS — E113411 Type 2 diabetes mellitus with severe nonproliferative diabetic retinopathy with macular edema, right eye: Secondary | ICD-10-CM | POA: Diagnosis not present

## 2018-10-02 LAB — POCT GLYCOSYLATED HEMOGLOBIN (HGB A1C): Hemoglobin A1C: 8.8 % — AB (ref 4.0–5.6)

## 2018-10-02 MED ORDER — INSULIN GLARGINE 100 UNIT/ML SOLOSTAR PEN: 65.0000 [IU] | PEN_INJECTOR | SUBCUTANEOUS | 1 refills | Status: DC

## 2018-10-02 NOTE — Patient Instructions (Signed)
Please take lantus, 65 units each morning, and stop taking the novolog. check your blood sugar twice a day.  vary the time of day when you check, between before the 3 meals, and at bedtime.  also check if you have symptoms of your blood sugar being too high or too low.  please keep a record of the readings and bring it to your next appointment here (or you can bring the meter itself).  You can write it on any piece of paper.  please call us sooner if your blood sugar goes below 70, or if you have a lot of readings over 200. Please come back for a follow-up appointment in 2 weeks.

## 2018-10-02 NOTE — Progress Notes (Signed)
Subjective:    Patient ID: David Lane, male    DOB: 12-25-1945, 73 y.o.   MRN: [id number]  HPI Pt returns for f/u of diabetes mellitus: DM type: due to pancreatic insufficiency Dx'ed: 1988, during an episode of pancreatitis.   Complications: CAD, DR, PAD, left transmetatarsal amputation, and PN.  Therapy: insulin since 1990.   DKA: never.   Severe hypoglycemia: last episode was in 2014; he says this was probably due to taking more than the prescribed insulin dosage.  Other: in 2014, he was changed to qd lantus, for further simplification of his insulin schedule.  V-GO pump was considered, but pt did not keep appt for it, and he later declined it; He is retired.   Interval history: He takes insulin as rx'ed.  pt states he feels well in general.  He has mild hypoglycemia approx once per week.  He takes 85 units QD.  He brings a record of his cbg's which I have reviewed today.  It varies from 60-577.  There is no trend throughout the day. He wants to stay with name-brand insulin, at least for now.  He takes lantus 55 units qd, and PRN novolog (takes 0-10 total units/day).  He seldom has hypoglycemia, and these episodes are mild.  This happens when a meal is missed or delayed.   Past Medical History:  Diagnosis Date  . CAD (coronary artery disease)    last cath by [facility] DR.  Mihai Croitoru showing  some  disease involving LCX and small size of Diag   . CHF exacerbation, due to diastolic dysfunction 32/95/1884  . Chronic systolic CHF (congestive heart failure), NYHA class 1 ([place]) 07/17/2012  . GERD (gastroesophageal reflux disease)   . Hepatic lesion 02/04/11  . Hyperlipemia   . Hypertension   . Ischemic cardiomyopathy    EF 35-40%  . Liver hemangioma   . NSTEMI (non-ST elevated myocardial infarction) ([place]) 11/21/2009  . Pancreatitis 2000's  . Shortness of breath   . Type II diabetes mellitus ([place])     Past Surgical History:  Procedure Laterality Date  . AMPUTATION Left 01/14/2018     Procedure: TRANSMETATARSAL AMPUTATION;  Surgeon: Newt Minion, MD;  Location: [place];  Service: Orthopedics;  Laterality: Left;  . CARDIAC CATHETERIZATION  2011   minimal disease, medical management  . CORONARY ANGIOPLASTY WITH STENT PLACEMENT  07/14/2012   successful PCI & stenting of mid LAD & PDA off the dominant CX  . CORONARY/GRAFT ACUTE MI REVASCULARIZATION N/A 12/20/2017   Procedure: Coronary/Graft Acute MI Revascularization;  Surgeon: Martinique, Peter M, MD;  Location: [place] [facility] LAB;  Service: Cardiovascular;  Laterality: N/A;  . INCISION AND DRAINAGE ABSCESS ANAL  1970's  . LEFT HEART CATH AND CORONARY ANGIOGRAPHY N/A 12/20/2017   Procedure: LEFT HEART CATH AND CORONARY ANGIOGRAPHY;  Surgeon: Martinique, Peter M, MD;  Location: [place] [facility];  Service: Cardiovascular;  Laterality: N/A;  . LEFT HEART CATHETERIZATION WITH CORONARY ANGIOGRAM N/A 07/14/2012   Procedure: LEFT HEART CATHETERIZATION WITH CORONARY ANGIOGRAM;  Surgeon: [name], MD;  Location: [place] CATH LAB;  Service: Cardiovascular;  Laterality: N/A;  . PERCUTANEOUS CORONARY STENT INTERVENTION (PCI-S) Right 07/14/2012   Procedure: PERCUTANEOUS CORONARY STENT INTERVENTION (PCI-S);  Surgeon: [name], MD;  Location: [facility] CATH LAB;  Service: Cardiovascular;  Laterality: Right;    Social History   Socioeconomic History  . Marital status: Single    Spouse name: Not on file  . Number of children: 0  .  Years of education: Not on file  . Highest education level: Not on file  Occupational History  . Occupation: retired    Fish farm manager: [employer]  Social Needs  . Financial resource strain: Not on file  . Food insecurity:    Worry: Not on file    Inability: Not on file  . Transportation needs:    Medical: Not on file    Non-medical: Not on file  Tobacco Use  . Smoking status: Never Smoker  . Smokeless tobacco: Never Used  Substance and Sexual Activity  . Alcohol use: No  . Drug use: No  . Sexual activity:  Never  Lifestyle  . Physical activity:    Days per week: Not on file    Minutes per session: Not on file  . Stress: Not on file  Relationships  . Social connections:    Talks on phone: Not on file    Gets together: Not on file    Attends religious service: Not on file    Active member of club or organization: Not on file    Attends meetings of clubs or organizations: Not on file    Relationship status: Not on file  . Intimate partner violence:    Fear of current or ex partner: Not on file    Emotionally abused: Not on file    Physically abused: Not on file    Forced sexual activity: Not on file  Other Topics Concern  . Not on file  Social History Narrative  . Not on file    Current Outpatient Medications on File Prior to Visit  Medication Sig Dispense Refill  . aspirin 81 MG tablet Take 81 mg by mouth daily.      . carvedilol (COREG) 3.125 MG tablet Take 1 tablet (3.125 mg total) by mouth 2 (two) times daily.    Marland Kitchen glucose blood (ONE TOUCH ULTRA TEST) test strip USE 1 STRIP TO CHECK GLUCOSE 4 TIMES DAILY; K86.89, E10.8 150 each 11  . Insulin Pen Needle (B-D UF III MINI PEN NEEDLES) 31G X 5 MM MISC use to inject insulin daily 100 each 0  . Multiple Vitamins-Minerals (CENTRUM SILVER ULTRA MENS PO) Take 1 tablet by mouth daily.     Marland Kitchen ofloxacin (OCUFLOX) 0.3 % ophthalmic solution Place 1 drop into the left eye 4 (four) times daily.  5  . pantoprazole (PROTONIX) 40 MG tablet Take 1 tablet (40 mg total) by mouth daily. 30 tablet 0  . prednisoLONE acetate (PRED FORTE) 1 % ophthalmic suspension Place 1 drop into the left eye 4 (four) times daily.  5  . rosuvastatin (CRESTOR) 40 MG tablet Take 1 tablet (40 mg total) by mouth at bedtime. 30 tablet 11  . ticagrelor (BRILINTA) 90 MG TABS tablet Take 1 tablet (90 mg total) by mouth 2 (two) times daily. 60 tablet 0   No current facility-administered medications on file prior to visit.     Allergies  Allergen Reactions  . Lipitor  [Atorvastatin] Other (See Comments)    Muscle weakness  . Metformin And Related Diarrhea    Severe diarrhea    Family History  Problem Relation Age of Onset  . Diabetes Mother   . Diabetes Paternal Grandmother   . Diabetes Maternal Grandfather   . Colon cancer Neg Hx     BP 108/68 (BP Location: Left Arm, Patient Position: Sitting, Cuff Size: Normal)   Pulse 68   Ht 6' (1.829 m)   Wt 196 lb 12.8 oz (89.3  kg)   SpO2 96%   BMI 26.69 kg/m   Review of Systems Denies LOC    Objective:   Physical Exam VITAL SIGNS:  See vs page.  GENERAL: no distress.   Pulses: dorsalis pedis intact bilat.   MSK: left transmetatarsal amputation.   CV: no leg edema Skin:  no ulcer on the feet.  normal color and temp on the feet. Neuro: sensation is intact to touch on the feet, but decreased from normal Ext: right great toenail is ingrown.     Lab Results  Component Value Date   HGBA1C 8.8 (A) 10/02/2018   Lab Results  Component Value Date   CREATININE 1.14 05/01/2018   BUN 30 (H) 05/01/2018   NA 136 05/01/2018   K 3.9 05/01/2018   CL 101 05/01/2018   CO2 24 05/01/2018        Assessment & Plan:  Insulin-requiring type 2 DM: Hypoglycemia: this limits aggressiveness of glycemic control Noncompliance with cbg recording, insulin, and f/u appts. We discussed.    Patient Instructions  Please take lantus, 65 units each morning, and stop taking the novolog. check your blood sugar twice a day.  vary the time of day when you check, between before the 3 meals, and at bedtime.  also check if you have symptoms of your blood sugar being too high or too low.  please keep a record of the readings and bring it to your next appointment here (or you can bring the meter itself).  You can write it on any piece of paper.  please call us sooner if your blood sugar goes below 70, or if you have a lot of readings over 200. Please come back for a follow-up appointment in 2 weeks.

## 2018-10-03 DIAGNOSIS — H4312 Vitreous hemorrhage, left eye: Secondary | ICD-10-CM | POA: Diagnosis not present

## 2018-10-03 DIAGNOSIS — E113412 Type 2 diabetes mellitus with severe nonproliferative diabetic retinopathy with macular edema, left eye: Secondary | ICD-10-CM | POA: Diagnosis not present

## 2018-10-03 DIAGNOSIS — H5789 Other specified disorders of eye and adnexa: Secondary | ICD-10-CM | POA: Diagnosis not present

## 2018-10-04 ENCOUNTER — Encounter: Payer: Medicare Other | Admitting: Family Medicine

## 2018-10-04 DIAGNOSIS — H4312 Vitreous hemorrhage, left eye: Secondary | ICD-10-CM | POA: Diagnosis not present

## 2018-10-05 ENCOUNTER — Ambulatory Visit: Payer: Medicare Other | Admitting: Cardiovascular Disease

## 2018-10-05 DIAGNOSIS — R0989 Other specified symptoms and signs involving the circulatory and respiratory systems: Secondary | ICD-10-CM

## 2018-10-06 ENCOUNTER — Encounter: Payer: Self-pay | Admitting: Family Medicine

## 2018-10-06 ENCOUNTER — Ambulatory Visit: Payer: Medicare Other | Admitting: Family Medicine

## 2018-10-06 VITALS — BP 138/86 | HR 77 | Temp 98.1°F | Ht 72.0 in | Wt 184.0 lb

## 2018-10-06 DIAGNOSIS — F419 Anxiety disorder, unspecified: Secondary | ICD-10-CM

## 2018-10-06 DIAGNOSIS — E089 Diabetes mellitus due to underlying condition without complications: Secondary | ICD-10-CM

## 2018-10-06 DIAGNOSIS — K8689 Other specified diseases of pancreas: Secondary | ICD-10-CM | POA: Diagnosis not present

## 2018-10-06 DIAGNOSIS — I10 Essential (primary) hypertension: Secondary | ICD-10-CM

## 2018-10-06 DIAGNOSIS — R5383 Other fatigue: Secondary | ICD-10-CM

## 2018-10-06 MED ORDER — PANCRELIPASE (LIP-PROT-AMYL) 24000-76000 UNITS PO CPEP: ORAL_CAPSULE | ORAL | 0 refills | Status: DC

## 2018-10-06 MED ORDER — ESCITALOPRAM OXALATE 5 MG PO TABS: 5.0000 mg | ORAL_TABLET | Freq: Every day | ORAL | 3 refills | Status: DC

## 2018-10-06 NOTE — Assessment & Plan Note (Signed)
-  Recommend trial of lexapro, f/u in 6 weeks.

## 2018-10-06 NOTE — Assessment & Plan Note (Signed)
-  Followed by endocrinology, stable at this time.

## 2018-10-06 NOTE — Patient Instructions (Signed)
-  Great to meet you! -Restart creon, I have placed a referral for you to see a GI specialist for continued management of this medication.  -We'll be in touch with lab results -Start lexapro for anxiety, take this daily.  See me again in 6 weeks for follow up of this.

## 2018-10-06 NOTE — Assessment & Plan Note (Signed)
-  Having increased diarrhea, worse after meals likely related to pancreatic insufficiency.  Restart creon at ~500units/kg/meal with 1/2 dose for snacks and will have him f/u with GI.   -Check B12 and vitamin d with complaint of fatigue

## 2018-10-06 NOTE — Progress Notes (Signed)
David Lane - 73 y.o. male MRN [id number]  Date of birth: [birth year]  Subjective Chief Complaint  Patient presents with  . Anxiety    HPI MERCURY ROCK is a 73 y.o. male with history of T2DM 2/2 to pancreatic insufficiency, hx of transmetatarsal amputation, CAD with CHF, and HTN here today to establish with new PCP.  He is being followed by endocrinology for management of his diabetes, currently treated with lantus 65 units daily.  He also is followed by cardiology.  Reports stable symptoms with angina.  He is compliant with medications.  He has concerns about some fatigue and anxiety today.   -Fatigue:  Reports increased fatigue over the past several months.  Denies associated chest pain or sob with fatigue.  Felt similar prior to starting creon after pancreatitis.  Reports chronic diarrhea, mainly just after eating.  He has not been taking creon for several months.    -Anxiety:  Reports felling overwhelmed and anxious especially regarding his health.  Often will perseverate on blood pressure and blood sugars, checking multiple times per day which only seems to worsen his anxiety.  He is interested in trying medication to help with this.    ROS:  A comprehensive ROS was completed and negative except as noted per HPI  Allergies  Allergen Reactions  . Lipitor [Atorvastatin] Other (See Comments)    Muscle weakness  . Metformin And Related Diarrhea    Severe diarrhea    Past Medical History:  Diagnosis Date  . CAD (coronary artery disease)    last cath by [facility] DR.  Mihai Croitoru showing  some  disease involving LCX and small size of Diag   . CHF exacerbation, due to diastolic dysfunction 51/88/4166  . Chronic systolic CHF (congestive heart failure), NYHA class 1 ([place]) 07/17/2012  . GERD (gastroesophageal reflux disease)   . Hepatic lesion 02/04/11  . Hyperlipemia   . Hypertension   . Ischemic cardiomyopathy    EF 35-40%  . Liver hemangioma   . NSTEMI (non-ST elevated  myocardial infarction) ([place]) 11/21/2009  . Pancreatitis 2000's  . Shortness of breath   . Type II diabetes mellitus ([place])     Past Surgical History:  Procedure Laterality Date  . AMPUTATION Left 01/14/2018   Procedure: TRANSMETATARSAL AMPUTATION;  Surgeon: Newt Minion, MD;  Location: [place];  Service: Orthopedics;  Laterality: Left;  . CARDIAC CATHETERIZATION  2011   minimal disease, medical management  . CORONARY ANGIOPLASTY WITH STENT PLACEMENT  07/14/2012   successful PCI & stenting of mid LAD & PDA off the dominant CX  . CORONARY/GRAFT ACUTE MI REVASCULARIZATION N/A 12/20/2017   Procedure: Coronary/Graft Acute MI Revascularization;  Surgeon: Martinique, Peter M, MD;  Location: [place] CV LAB;  Service: Cardiovascular;  Laterality: N/A;  . INCISION AND DRAINAGE ABSCESS ANAL  1970's  . LEFT HEART CATH AND CORONARY ANGIOGRAPHY N/A 12/20/2017   Procedure: LEFT HEART CATH AND CORONARY ANGIOGRAPHY;  Surgeon: Martinique, Peter M, MD;  Location: [place] CV LAB;  Service: Cardiovascular;  Laterality: N/A;  . LEFT HEART CATHETERIZATION WITH CORONARY ANGIOGRAM N/A 07/14/2012   Procedure: LEFT HEART CATHETERIZATION WITH CORONARY ANGIOGRAM;  Surgeon: [name], MD;  Location: [place] CATH LAB;  Service: Cardiovascular;  Laterality: N/A;  . PERCUTANEOUS CORONARY STENT INTERVENTION (PCI-S) Right 07/14/2012   Procedure: PERCUTANEOUS CORONARY STENT INTERVENTION (PCI-S);  Surgeon: [name], MD;  Location: [facility] C[facility];  Service: Cardiovascular;  Laterality: Right;    Social History   Socioeconomic History  .  Marital status: Single    Spouse name: Not on file  . Number of children: 0  . Years of education: Not on file  . Highest education level: Not on file  Occupational History  . Occupation: retired    Fish farm manager: [employer]  Social Needs  . Financial resource strain: Not on file  . Food insecurity:    Worry: Not on file    Inability: Not on file  . Transportation needs:    Medical: Not  on file    Non-medical: Not on file  Tobacco Use  . Smoking status: Never Smoker  . Smokeless tobacco: Never Used  Substance and Sexual Activity  . Alcohol use: No  . Drug use: No  . Sexual activity: Never  Lifestyle  . Physical activity:    Days per week: Not on file    Minutes per session: Not on file  . Stress: Not on file  Relationships  . Social connections:    Talks on phone: Not on file    Gets together: Not on file    Attends religious service: Not on file    Active member of club or organization: Not on file    Attends meetings of clubs or organizations: Not on file    Relationship status: Not on file  Other Topics Concern  . Not on file  Social History Narrative  . Not on file    Family History  Problem Relation Age of Onset  . Diabetes Mother   . Diabetes Paternal Grandmother   . Diabetes Maternal Grandfather   . Colon cancer Neg Hx     Health Maintenance  Topic Date Due  . Hepatitis C Screening  January 11, 1946  . OPHTHALMOLOGY EXAM  09/09/1958  . PNA vac Low Risk Adult (2 of 2 - PPSV23) 12/05/2015  . INFLUENZA VACCINE  10/22/2024 (Originally 04/27/2018)  . HEMOGLOBIN A1C  04/02/2019  . FOOT EXAM  10/03/2019  . COLONOSCOPY  02/03/2021  . TETANUS/TDAP  12/05/2023    ----------------------------------------------------------------------------------------------------------------------------------------------------------------------------------------------------------------- Physical Exam BP 138/86   Pulse 77   Temp 98.1 F (36.7 C) (Oral)   Ht 6' (1.829 m)   Wt 184 lb (83.5 kg)   SpO2 98%   BMI 24.95 kg/m   Physical Exam Constitutional:      Appearance: Normal appearance. He is not ill-appearing.  HENT:     Head: Normocephalic and atraumatic.     Mouth/Throat:     Mouth: Mucous membranes are moist.  Eyes:     General: No scleral icterus. Neck:     Musculoskeletal: Neck supple.  Cardiovascular:     Rate and Rhythm: Normal rate and regular  rhythm.  Pulmonary:     Effort: Pulmonary effort is normal.     Breath sounds: Normal breath sounds.  Musculoskeletal:     Right lower leg: No edema.     Left lower leg: No edema.  Lymphadenopathy:     Cervical: No cervical adenopathy.  Skin:    General: Skin is warm.  Neurological:     General: No focal deficit present.     Mental Status: He is alert.  Psychiatric:        Mood and Affect: Mood normal.        Behavior: Behavior normal.     ------------------------------------------------------------------------------------------------------------------------------------------------------------------------------------------------------------------- Assessment and Plan  Pancreatic insufficiency -Having increased diarrhea, worse after meals likely related to pancreatic insufficiency.  Restart creon at ~500units/kg/meal with 1/2 dose for snacks and will have him f/u with GI.   -  Check B12 and vitamin d with complaint of fatigue  Essential hypertension -BP is well controlled, recommend continuation of current medications.   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 (HCC) -Followed by endocrinology, stable at this time.   Anxiety -Recommend trial of lexapro, f/u in 6 weeks.

## 2018-10-06 NOTE — Assessment & Plan Note (Signed)
-  BP is well controlled, recommend continuation of current medications.

## 2018-10-07 LAB — CBC
HEMATOCRIT: 38 % — AB (ref 38.5–50.0)
HEMOGLOBIN: 12.7 g/dL — AB (ref 13.2–17.1)
MCH: 31.2 pg (ref 27.0–33.0)
MCHC: 33.4 g/dL (ref 32.0–36.0)
MCV: 93.4 fL (ref 80.0–100.0)
MPV: 12 fL (ref 7.5–12.5)
Platelets: 191 10*3/uL (ref 140–400)
RBC: 4.07 10*6/uL — ABNORMAL LOW (ref 4.20–5.80)
RDW: 12.8 % (ref 11.0–15.0)
WBC: 6.4 10*3/uL (ref 3.8–10.8)

## 2018-10-07 LAB — TSH: TSH: 1.94 mIU/L (ref 0.40–4.50)

## 2018-10-07 LAB — VITAMIN B12: Vitamin B-12: 547 pg/mL (ref 200–1100)

## 2018-10-07 LAB — VITAMIN D 25 HYDROXY (VIT D DEFICIENCY, FRACTURES): VIT D 25 HYDROXY: 15 ng/mL — AB (ref 30–100)

## 2018-10-09 ENCOUNTER — Encounter: Payer: Self-pay | Admitting: Cardiovascular Disease

## 2018-10-11 NOTE — Progress Notes (Signed)
-  Vitamin D levels are low, recommend OTC vitamin D supplement of 2000 IU daily.   -Also with mild anemia, we'll continue to monitor this at routine visits.

## 2018-10-18 ENCOUNTER — Ambulatory Visit: Payer: Medicare Other | Admitting: Cardiovascular Disease

## 2018-10-18 DIAGNOSIS — E113411 Type 2 diabetes mellitus with severe nonproliferative diabetic retinopathy with macular edema, right eye: Secondary | ICD-10-CM | POA: Diagnosis not present

## 2018-10-18 DIAGNOSIS — E113412 Type 2 diabetes mellitus with severe nonproliferative diabetic retinopathy with macular edema, left eye: Secondary | ICD-10-CM | POA: Diagnosis not present

## 2018-10-24 ENCOUNTER — Ambulatory Visit: Payer: Medicare Other | Admitting: Endocrinology

## 2018-10-30 DIAGNOSIS — R269 Unspecified abnormalities of gait and mobility: Secondary | ICD-10-CM | POA: Diagnosis not present

## 2018-11-15 DIAGNOSIS — E113412 Type 2 diabetes mellitus with severe nonproliferative diabetic retinopathy with macular edema, left eye: Secondary | ICD-10-CM | POA: Diagnosis not present

## 2018-11-22 ENCOUNTER — Ambulatory Visit: Payer: Medicare Other | Admitting: Family Medicine

## 2018-11-22 DIAGNOSIS — E113411 Type 2 diabetes mellitus with severe nonproliferative diabetic retinopathy with macular edema, right eye: Secondary | ICD-10-CM | POA: Diagnosis not present

## 2018-11-24 ENCOUNTER — Encounter: Payer: Self-pay | Admitting: Family Medicine

## 2018-12-19 ENCOUNTER — Telehealth: Payer: Self-pay | Admitting: Endocrinology

## 2018-12-19 NOTE — Telephone Encounter (Signed)
MEDICATION: Insulin Pen Needle (B-D UF III MINI PEN NEEDLES) 31G X 5 MM MISC  PHARMACY:  [facility] [phone] - [place], [place] - [place] A 90 DAY SUPPLY :    IS PATIENT OUT OF MEDICATION: yes  IF NOT; HOW MUCH IS LEFT:   LAST APPOINTMENT DATE: @1 /02/2019  NEXT APPOINTMENT DATE:@Visit  date not found  DO WE HAVE YOUR PERMISSION TO LEAVE A DETAILED MESSAGE:  OTHER COMMENTS:    **Let patient know to contact pharmacy at the end of the day to make sure medication is ready. **  ** Please notify patient to allow 48-72 hours to process**  **Encourage patient to contact the pharmacy for refills or they can request refills through [facility]**

## 2018-12-20 ENCOUNTER — Other Ambulatory Visit: Payer: Self-pay | Admitting: Endocrinology

## 2018-12-20 MED ORDER — INSULIN PEN NEEDLE 31G X 5 MM MISC: 0 refills | Status: DC

## 2018-12-20 NOTE — Telephone Encounter (Signed)
Rx.Insulin Pen Needle (B-D UF III MINI PEN NEEDLES) 31G X 5 MM MISC refilled --#100, R-0  [facility]

## 2019-01-11 ENCOUNTER — Other Ambulatory Visit: Payer: Self-pay | Admitting: Cardiovascular Disease

## 2019-01-11 MED ORDER — TICAGRELOR 90 MG PO TABS: 90.0000 mg | ORAL_TABLET | Freq: Two times a day (BID) | ORAL | 1 refills | Status: DC

## 2019-01-11 NOTE — Telephone Encounter (Signed)
°*  STAT* If patient is at the pharmacy, call can be transferred to refill team.   1. Which medications need to be refilled? (please list name of each medication and dose if known) Bulliant 90 gm  2. Which pharmacy/location (including street and city if local pharmacy) is medication to be sent to? [address] [phone]  3. Do they need a 30 day or 90 day supply? [place]

## 2019-01-11 NOTE — Telephone Encounter (Signed)
Brilinta 90 mg refilled.

## 2019-01-11 NOTE — Telephone Encounter (Signed)
Patient called stating he is out of medication.

## 2019-01-25 ENCOUNTER — Telehealth: Payer: Self-pay | Admitting: Family Medicine

## 2019-01-25 NOTE — Telephone Encounter (Signed)
I called and left message on patient voicemail to call office to schedule follow up appointment with Dr. Zigmund Daniel.

## 2019-02-08 ENCOUNTER — Telehealth: Payer: Medicare Other | Admitting: Cardiovascular Disease

## 2019-02-23 ENCOUNTER — Other Ambulatory Visit: Payer: Self-pay | Admitting: Cardiovascular Disease

## 2019-03-07 ENCOUNTER — Other Ambulatory Visit: Payer: Self-pay | Admitting: Endocrinology

## 2019-03-08 ENCOUNTER — Other Ambulatory Visit: Payer: Self-pay | Admitting: Endocrinology

## 2019-03-15 ENCOUNTER — Other Ambulatory Visit: Payer: Self-pay

## 2019-03-15 ENCOUNTER — Telehealth: Payer: Self-pay | Admitting: Endocrinology

## 2019-03-15 DIAGNOSIS — E089 Diabetes mellitus due to underlying condition without complications: Secondary | ICD-10-CM

## 2019-03-15 MED ORDER — LANTUS SOLOSTAR 100 UNIT/ML ~~LOC~~ SOPN: 65.0000 [IU] | PEN_INJECTOR | SUBCUTANEOUS | 0 refills | Status: DC

## 2019-03-15 MED ORDER — TICAGRELOR 90 MG PO TABS: 90.0000 mg | ORAL_TABLET | Freq: Two times a day (BID) | ORAL | 4 refills | Status: DC

## 2019-03-15 MED ORDER — BD PEN NEEDLE MINI U/F 31G X 5 MM MISC: 0 refills | Status: DC

## 2019-03-15 NOTE — Telephone Encounter (Signed)
E-Prescribing Status: Receipt confirmed by pharmacy (03/15/2019 2:22 PM EDT)

## 2019-03-15 NOTE — Telephone Encounter (Signed)
MEDICATION: Lantus Solostar  PHARMACY:  [facility] [address]  IS THIS A 90 DAY SUPPLY :   IS PATIENT OUT OF MEDICATION:   IF NOT; HOW MUCH IS LEFT:   LAST APPOINTMENT DATE: @6 /07/2019  NEXT APPOINTMENT DATE:@Visit  date not found  DO WE HAVE YOUR PERMISSION TO LEAVE A DETAILED MESSAGE: YES, [phone]  OTHER COMMENTS:    **Let patient know to contact pharmacy at the end of the day to make sure medication is ready. **  ** Please notify patient to allow 48-72 hours to process**  **Encourage patient to contact the pharmacy for refills or they can request refills through [facility]**

## 2019-03-27 DIAGNOSIS — E113599 Type 2 diabetes mellitus with proliferative diabetic retinopathy without macular edema, unspecified eye: Secondary | ICD-10-CM | POA: Diagnosis not present

## 2019-03-27 DIAGNOSIS — H4312 Vitreous hemorrhage, left eye: Secondary | ICD-10-CM | POA: Diagnosis not present

## 2019-03-27 DIAGNOSIS — E113411 Type 2 diabetes mellitus with severe nonproliferative diabetic retinopathy with macular edema, right eye: Secondary | ICD-10-CM | POA: Diagnosis not present

## 2019-03-28 ENCOUNTER — Other Ambulatory Visit: Payer: Self-pay

## 2019-03-29 ENCOUNTER — Encounter: Payer: Self-pay | Admitting: Endocrinology

## 2019-03-29 ENCOUNTER — Ambulatory Visit (INDEPENDENT_AMBULATORY_CARE_PROVIDER_SITE_OTHER): Payer: Medicare Other | Admitting: Endocrinology

## 2019-03-29 VITALS — BP 154/84 | HR 81 | Ht 72.0 in | Wt 193.0 lb

## 2019-03-29 DIAGNOSIS — K8689 Other specified diseases of pancreas: Secondary | ICD-10-CM

## 2019-03-29 DIAGNOSIS — E089 Diabetes mellitus due to underlying condition without complications: Secondary | ICD-10-CM | POA: Diagnosis not present

## 2019-03-29 LAB — POCT GLYCOSYLATED HEMOGLOBIN (HGB A1C): Hemoglobin A1C: 9.1 % — AB (ref 4.0–5.6)

## 2019-03-29 MED ORDER — NOVOLOG FLEXPEN 100 UNIT/ML ~~LOC~~ SOPN: 2.0000 [IU] | PEN_INJECTOR | Freq: Three times a day (TID) | SUBCUTANEOUS | 11 refills | Status: DC

## 2019-03-29 NOTE — Patient Instructions (Addendum)
Your blood pressure is high today.  Please see your primary care provider soon, to have it rechecked Please continue the same lantus, and:  take 2 units novolog only of your blood sugar is over 400.   Please see Vaughan Basta, to consider a "V-GO," or regular pump. check your blood sugar twice a day.  vary the time of day when you check, between before the 3 meals, and at bedtime.  also check if you have symptoms of your blood sugar being too high or too low.  please keep a record of the readings and bring it to your next appointment here (or you can bring the meter itself).  You can write it on any piece of paper.  please call us sooner if your blood sugar goes below 70, or if you have a lot of readings over 200. Please come back for a follow-up appointment in 2 months.

## 2019-03-29 NOTE — Progress Notes (Signed)
Subjective:    Patient ID: David Lane, male    DOB: Oct 25, 1945, 73 y.o.   MRN: [id number]  HPI Pt returns for f/u of diabetes mellitus: DM type: due to pancreatic insufficiency Dx'ed: 1988, during an episode of pancreatitis.   Complications: CAD, DR, PAD, left transmetatarsal amputation, and PN.  Therapy: insulin since 1990.   DKA: never.   Severe hypoglycemia: last episode was in 2014; he says this was probably due to taking more than the prescribed insulin dosage.  Other: in 2014, he was changed to qd lantus, for further simplification of his insulin schedule.  V-GO pump was considered, but pt did not keep appt for it, and he later declined it; He is retired.   Interval history: no cbg record, but states cbg's vary from 60-500.  He has mild hypoglycemia approx twice per week. pt states he feels well in general.  He says he takes insulin as rx'ed Past Medical History:  Diagnosis Date  . CAD (coronary artery disease)    last cath by [facility] DR.  Mihai Croitoru showing  some  disease involving LCX and small size of Diag   . CHF exacerbation, due to diastolic dysfunction 42/35/3614  . Chronic systolic CHF (congestive heart failure), NYHA class 1 ([place]) 07/17/2012  . GERD (gastroesophageal reflux disease)   . Hepatic lesion 02/04/11  . Hyperlipemia   . Hypertension   . Ischemic cardiomyopathy    EF 35-40%  . Liver hemangioma   . NSTEMI (non-ST elevated myocardial infarction) ([place]) 11/21/2009  . Pancreatitis 2000's  . Shortness of breath   . Type II diabetes mellitus ([place])     Past Surgical History:  Procedure Laterality Date  . AMPUTATION Left 01/14/2018   Procedure: TRANSMETATARSAL AMPUTATION;  Surgeon: Newt Minion, MD;  Location: [place];  Service: Orthopedics;  Laterality: Left;  . CARDIAC CATHETERIZATION  2011   minimal disease, medical management  . CORONARY ANGIOPLASTY WITH STENT PLACEMENT  07/14/2012   successful PCI & stenting of mid LAD & PDA off the dominant CX  .  CORONARY/GRAFT ACUTE MI REVASCULARIZATION N/A 12/20/2017   Procedure: Coronary/Graft Acute MI Revascularization;  Surgeon: Martinique, Peter M, MD;  Location: [place] CV LAB;  Service: Cardiovascular;  Laterality: N/A;  . INCISION AND DRAINAGE ABSCESS ANAL  1970's  . LEFT HEART CATH AND CORONARY ANGIOGRAPHY N/A 12/20/2017   Procedure: LEFT HEART CATH AND CORONARY ANGIOGRAPHY;  Surgeon: Martinique, Peter M, MD;  Location: [place] CV LAB;  Service: Cardiovascular;  Laterality: N/A;  . LEFT HEART CATHETERIZATION WITH CORONARY ANGIOGRAM N/A 07/14/2012   Procedure: LEFT HEART CATHETERIZATION WITH CORONARY ANGIOGRAM;  Surgeon: [name], MD;  Location: [place] CATH LAB;  Service: Cardiovascular;  Laterality: N/A;  . PERCUTANEOUS CORONARY STENT INTERVENTION (PCI-S) Right 07/14/2012   Procedure: PERCUTANEOUS CORONARY STENT INTERVENTION (PCI-S);  Surgeon: [name], MD;  Location: [facility] C[facility];  Service: Cardiovascular;  Laterality: Right;    Social History   Socioeconomic History  . Marital status: Single    Spouse name: Not on file  . Number of children: 0  . Years of education: Not on file  . Highest education level: Not on file  Occupational History  . Occupation: retired    Fish farm manager: [employer]  Social Needs  . Financial resource strain: Not on file  . Food insecurity    Worry: Not on file    Inability: Not on file  . Transportation needs    Medical: Not on file  Non-medical: Not on file  Tobacco Use  . Smoking status: Never Smoker  . Smokeless tobacco: Never Used  Substance and Sexual Activity  . Alcohol use: No  . Drug use: No  . Sexual activity: Never  Lifestyle  . Physical activity    Days per week: Not on file    Minutes per session: Not on file  . Stress: Not on file  Relationships  . Social Herbalist on phone: Not on file    Gets together: Not on file    Attends religious service: Not on file    Active member of club or organization: Not on file     Attends meetings of clubs or organizations: Not on file    Relationship status: Not on file  . Intimate partner violence    Fear of current or ex partner: Not on file    Emotionally abused: Not on file    Physically abused: Not on file    Forced sexual activity: Not on file  Other Topics Concern  . Not on file  Social History Narrative  . Not on file    Current Outpatient Medications on File Prior to Visit  Medication Sig Dispense Refill  . aspirin 81 MG tablet Take 81 mg by mouth daily.      . carvedilol (COREG) 6.25 MG tablet Take 6.25 mg by mouth 2 (two) times daily with a meal.    . escitalopram (LEXAPRO) 5 MG tablet Take 1 tablet (5 mg total) by mouth daily. 30 tablet 3  . glucose blood (ONE TOUCH ULTRA TEST) test strip USE 1 STRIP TO CHECK GLUCOSE 4 TIMES DAILY; K86.89, E10.8 150 each 11  . Insulin Glargine (LANTUS SOLOSTAR) 100 UNIT/ML Solostar Pen Inject 65 Units into the skin every morning. And pen needles 1/day 10 mL 0  . Insulin Pen Needle (B-D UF III MINI PEN NEEDLES) 31G X 5 MM MISC use to inject insulin daily 100 each 0  . lisinopril (PRINIVIL,ZESTRIL) 20 MG tablet Take 20 mg by mouth daily.    . Multiple Vitamins-Minerals (CENTRUM SILVER ULTRA MENS PO) Take 1 tablet by mouth daily.     Marland Kitchen ofloxacin (OCUFLOX) 0.3 % ophthalmic solution Place 1 drop into the left eye 4 (four) times daily.  5  . Pancrelipase, Lip-Prot-Amyl, (CREON) 24000-76000 units CPEP TAKE 2 CAPSULES WITH EACH MEAL AND 1 CAPSULE WITH SMALL SNACKS (10 CAPS PER DAY) 810 capsule 0  . pantoprazole (PROTONIX) 40 MG tablet Take 1 tablet (40 mg total) by mouth daily. 30 tablet 0  . prednisoLONE acetate (PRED FORTE) 1 % ophthalmic suspension Place 1 drop into the left eye 4 (four) times daily.  5  . rosuvastatin (CRESTOR) 40 MG tablet TAKE 1 TABLET(40 MG) BY MOUTH AT BEDTIME 30 tablet 6  . ticagrelor (BRILINTA) 90 MG TABS tablet Take 1 tablet (90 mg total) by mouth 2 (two) times daily. 60 tablet 4   No current  facility-administered medications on file prior to visit.     Allergies  Allergen Reactions  . Lipitor [Atorvastatin] Other (See Comments)    Muscle weakness  . Metformin And Related Diarrhea    Severe diarrhea    Family History  Problem Relation Age of Onset  . Diabetes Mother   . Diabetes Paternal Grandmother   . Diabetes Maternal Grandfather   . Colon cancer Neg Hx     BP (!) 154/84 (BP Location: Left Arm, Patient Position: Sitting, Cuff Size: Normal)   Pulse  81   Ht 6' (1.829 m)   Wt 193 lb (87.5 kg)   SpO2 95%   BMI 26.18 kg/m    Review of Systems Denies LOC    Objective:   Physical Exam VITAL SIGNS:  See vs page.  GENERAL: no distress.   Pulses: dorsalis pedis absent bilat.   MSK: left transmetatarsal amputation.   CV: trace bilat leg edema Skin:  no ulcer on the feet.  normal color and temp on the feet.   Neuro: sensation is intact to touch on the feet, but decreased from normal.   Ext: right great toenail is ingrown.     Lab Results  Component Value Date   HGBA1C 9.1 (A) 03/29/2019       Assessment & Plan:  HTN: is noted today.  Hypoglycemia: this limits aggressiveness of glycemic control Insulin-requiring type 2 DM, with PAD: worse.  Multiple rx options have been tried.  We discussed.  He agrees to reconsider pump rx   Patient Instructions  Your blood pressure is high today.  Please see your primary care provider soon, to have it rechecked Please continue the same lantus, and:  take 2 units novolog only of your blood sugar is over 400.   Please see Vaughan Basta, to consider a "V-GO," or regular pump. check your blood sugar twice a day.  vary the time of day when you check, between before the 3 meals, and at bedtime.  also check if you have symptoms of your blood sugar being too high or too low.  please keep a record of the readings and bring it to your next appointment here (or you can bring the meter itself).  You can write it on any piece of paper.   please call us sooner if your blood sugar goes below 70, or if you have a lot of readings over 200. Please come back for a follow-up appointment in 2 months.

## 2019-04-05 ENCOUNTER — Other Ambulatory Visit: Payer: Self-pay | Admitting: Endocrinology

## 2019-04-05 ENCOUNTER — Telehealth: Payer: Self-pay | Admitting: Endocrinology

## 2019-04-05 DIAGNOSIS — E089 Diabetes mellitus due to underlying condition without complications: Secondary | ICD-10-CM

## 2019-04-05 NOTE — Telephone Encounter (Signed)
MEDICATION: Lantus Solostar  PHARMACY:  [facility] [address] :   IS PATIENT OUT OF MEDICATION: yes  IF NOT; HOW MUCH IS LEFT:   LAST APPOINTMENT DATE: @7 /10/2018  NEXT APPOINTMENT DATE:@9 /12/2018  DO WE HAVE YOUR PERMISSION TO LEAVE A DETAILED MESSAGE:yes  OTHER COMMENTS:    **Let patient know to contact pharmacy at the end of the day to make sure medication is ready. **  ** Please notify patient to allow 48-72 hours to process**  **Encourage patient to contact the pharmacy for refills or they can request refills through [facility]**

## 2019-04-06 NOTE — Telephone Encounter (Signed)
LANTUS SOLOSTAR 100 UNIT/ML Solostar Pen 9 mL 0 04/06/2019    Sig: INJECT 65 UNITS UNDER THE SKIN EVERY MORNING   Sent to pharmacy as: LANTUS SOLOSTAR 100 UNIT/ML Solostar Pen   E-Prescribing Status: Receipt confirmed by pharmacy (04/06/2019 7:07 AM EDT)

## 2019-04-09 ENCOUNTER — Telehealth: Payer: Self-pay | Admitting: *Deleted

## 2019-04-09 NOTE — Telephone Encounter (Signed)
Left a message for the patient to call back to confirm his appointment tomorrow with Dr. Sallyanne Kuster.

## 2019-04-10 ENCOUNTER — Ambulatory Visit: Payer: Medicare Other | Admitting: Cardiovascular Disease

## 2019-04-23 ENCOUNTER — Emergency Department (HOSPITAL_COMMUNITY): Payer: Medicare Other

## 2019-04-23 ENCOUNTER — Other Ambulatory Visit: Payer: Self-pay

## 2019-04-23 ENCOUNTER — Inpatient Hospital Stay (HOSPITAL_COMMUNITY)
Admission: EM | Admit: 2019-04-23 | Discharge: 2019-04-27 | DRG: 378 | Disposition: A | Discharge status: Home / Self Care | Payer: Medicare Other | Attending: Internal Medicine | Admitting: Internal Medicine

## 2019-04-23 ENCOUNTER — Encounter (HOSPITAL_COMMUNITY): Payer: Self-pay | Admitting: Emergency Medicine

## 2019-04-23 DIAGNOSIS — R110 Nausea: Secondary | ICD-10-CM | POA: Diagnosis not present

## 2019-04-23 DIAGNOSIS — Z79899 Other long term (current) drug therapy: Secondary | ICD-10-CM

## 2019-04-23 DIAGNOSIS — K861 Other chronic pancreatitis: Secondary | ICD-10-CM | POA: Diagnosis present

## 2019-04-23 DIAGNOSIS — K922 Gastrointestinal hemorrhage, unspecified: Secondary | ICD-10-CM | POA: Diagnosis present

## 2019-04-23 DIAGNOSIS — Z794 Long term (current) use of insulin: Secondary | ICD-10-CM | POA: Diagnosis not present

## 2019-04-23 DIAGNOSIS — D62 Acute posthemorrhagic anemia: Secondary | ICD-10-CM | POA: Diagnosis present

## 2019-04-23 DIAGNOSIS — E108 Type 1 diabetes mellitus with unspecified complications: Secondary | ICD-10-CM | POA: Diagnosis present

## 2019-04-23 DIAGNOSIS — E1165 Type 2 diabetes mellitus with hyperglycemia: Secondary | ICD-10-CM | POA: Diagnosis not present

## 2019-04-23 DIAGNOSIS — K3189 Other diseases of stomach and duodenum: Secondary | ICD-10-CM | POA: Diagnosis not present

## 2019-04-23 DIAGNOSIS — I10 Essential (primary) hypertension: Secondary | ICD-10-CM | POA: Diagnosis present

## 2019-04-23 DIAGNOSIS — R739 Hyperglycemia, unspecified: Secondary | ICD-10-CM

## 2019-04-23 DIAGNOSIS — Z955 Presence of coronary angioplasty implant and graft: Secondary | ICD-10-CM

## 2019-04-23 DIAGNOSIS — Z833 Family history of diabetes mellitus: Secondary | ICD-10-CM | POA: Diagnosis not present

## 2019-04-23 DIAGNOSIS — I252 Old myocardial infarction: Secondary | ICD-10-CM | POA: Diagnosis not present

## 2019-04-23 DIAGNOSIS — K8689 Other specified diseases of pancreas: Secondary | ICD-10-CM | POA: Diagnosis not present

## 2019-04-23 DIAGNOSIS — I255 Ischemic cardiomyopathy: Secondary | ICD-10-CM | POA: Diagnosis present

## 2019-04-23 DIAGNOSIS — K641 Second degree hemorrhoids: Secondary | ICD-10-CM | POA: Diagnosis not present

## 2019-04-23 DIAGNOSIS — R42 Dizziness and giddiness: Secondary | ICD-10-CM

## 2019-04-23 DIAGNOSIS — K2980 Duodenitis without bleeding: Secondary | ICD-10-CM

## 2019-04-23 DIAGNOSIS — K254 Chronic or unspecified gastric ulcer with hemorrhage: Secondary | ICD-10-CM | POA: Diagnosis not present

## 2019-04-23 DIAGNOSIS — R1110 Vomiting, unspecified: Secondary | ICD-10-CM | POA: Diagnosis not present

## 2019-04-23 DIAGNOSIS — K317 Polyp of stomach and duodenum: Secondary | ICD-10-CM | POA: Diagnosis present

## 2019-04-23 DIAGNOSIS — K6389 Other specified diseases of intestine: Secondary | ICD-10-CM | POA: Diagnosis not present

## 2019-04-23 DIAGNOSIS — K5521 Angiodysplasia of colon with hemorrhage: Secondary | ICD-10-CM | POA: Diagnosis not present

## 2019-04-23 DIAGNOSIS — K2100 Gastro-esophageal reflux disease with esophagitis, without bleeding: Secondary | ICD-10-CM

## 2019-04-23 DIAGNOSIS — K259 Gastric ulcer, unspecified as acute or chronic, without hemorrhage or perforation: Secondary | ICD-10-CM | POA: Diagnosis not present

## 2019-04-23 DIAGNOSIS — I110 Hypertensive heart disease with heart failure: Secondary | ICD-10-CM | POA: Diagnosis present

## 2019-04-23 DIAGNOSIS — D125 Benign neoplasm of sigmoid colon: Secondary | ICD-10-CM | POA: Diagnosis not present

## 2019-04-23 DIAGNOSIS — Z7902 Long term (current) use of antithrombotics/antiplatelets: Secondary | ICD-10-CM | POA: Diagnosis not present

## 2019-04-23 DIAGNOSIS — I5042 Chronic combined systolic (congestive) and diastolic (congestive) heart failure: Secondary | ICD-10-CM | POA: Diagnosis not present

## 2019-04-23 DIAGNOSIS — K209 Esophagitis, unspecified: Secondary | ICD-10-CM | POA: Diagnosis not present

## 2019-04-23 DIAGNOSIS — Z7982 Long term (current) use of aspirin: Secondary | ICD-10-CM

## 2019-04-23 DIAGNOSIS — K5520 Angiodysplasia of colon without hemorrhage: Secondary | ICD-10-CM

## 2019-04-23 DIAGNOSIS — Z89422 Acquired absence of other left toe(s): Secondary | ICD-10-CM

## 2019-04-23 DIAGNOSIS — R1111 Vomiting without nausea: Secondary | ICD-10-CM | POA: Diagnosis not present

## 2019-04-23 DIAGNOSIS — R55 Syncope and collapse: Secondary | ICD-10-CM | POA: Diagnosis not present

## 2019-04-23 DIAGNOSIS — R531 Weakness: Secondary | ICD-10-CM | POA: Diagnosis not present

## 2019-04-23 DIAGNOSIS — Z888 Allergy status to other drugs, medicaments and biological substances status: Secondary | ICD-10-CM

## 2019-04-23 DIAGNOSIS — Z20828 Contact with and (suspected) exposure to other viral communicable diseases: Secondary | ICD-10-CM | POA: Diagnosis present

## 2019-04-23 DIAGNOSIS — Z860100 Personal history of colon polyps, unspecified: Secondary | ICD-10-CM

## 2019-04-23 DIAGNOSIS — K635 Polyp of colon: Secondary | ICD-10-CM | POA: Diagnosis not present

## 2019-04-23 DIAGNOSIS — E785 Hyperlipidemia, unspecified: Secondary | ICD-10-CM | POA: Diagnosis not present

## 2019-04-23 DIAGNOSIS — I2510 Atherosclerotic heart disease of native coronary artery without angina pectoris: Secondary | ICD-10-CM | POA: Diagnosis present

## 2019-04-23 DIAGNOSIS — E1065 Type 1 diabetes mellitus with hyperglycemia: Secondary | ICD-10-CM | POA: Diagnosis present

## 2019-04-23 DIAGNOSIS — D122 Benign neoplasm of ascending colon: Secondary | ICD-10-CM | POA: Diagnosis not present

## 2019-04-23 DIAGNOSIS — D124 Benign neoplasm of descending colon: Secondary | ICD-10-CM | POA: Diagnosis not present

## 2019-04-23 DIAGNOSIS — D123 Benign neoplasm of transverse colon: Secondary | ICD-10-CM | POA: Diagnosis not present

## 2019-04-23 LAB — HEPATIC FUNCTION PANEL
ALT: 91 U/L — ABNORMAL HIGH (ref 0–44)
AST: 50 U/L — ABNORMAL HIGH (ref 15–41)
Albumin: 3.3 g/dL — ABNORMAL LOW (ref 3.5–5.0)
Alkaline Phosphatase: 61 U/L (ref 38–126)
Bilirubin, Direct: 0.1 mg/dL (ref 0.0–0.2)
Indirect Bilirubin: 0.8 mg/dL (ref 0.3–0.9)
Total Bilirubin: 0.9 mg/dL (ref 0.3–1.2)
Total Protein: 6.2 g/dL — ABNORMAL LOW (ref 6.5–8.1)

## 2019-04-23 LAB — CBC
HCT: 31.2 % — ABNORMAL LOW (ref 39.0–52.0)
Hemoglobin: 10.3 g/dL — ABNORMAL LOW (ref 13.0–17.0)
MCH: 31.6 pg (ref 26.0–34.0)
MCHC: 33 g/dL (ref 30.0–36.0)
MCV: 95.7 fL (ref 80.0–100.0)
Platelets: 215 10*3/uL (ref 150–400)
RBC: 3.26 MIL/uL — ABNORMAL LOW (ref 4.22–5.81)
RDW: 12.5 % (ref 11.5–15.5)
WBC: 10.4 10*3/uL (ref 4.0–10.5)
nRBC: 0 % (ref 0.0–0.2)

## 2019-04-23 LAB — POC OCCULT BLOOD, ED: Fecal Occult Bld: POSITIVE — AB

## 2019-04-23 LAB — BASIC METABOLIC PANEL
Anion gap: 12 (ref 5–15)
BUN: 56 mg/dL — ABNORMAL HIGH (ref 8–23)
CO2: 22 mmol/L (ref 22–32)
Calcium: 8.2 mg/dL — ABNORMAL LOW (ref 8.9–10.3)
Chloride: 101 mmol/L (ref 98–111)
Creatinine, Ser: 1.18 mg/dL (ref 0.61–1.24)
GFR calc Af Amer: >60 mL/min (ref >60)
GFR calc non Af Amer: >60 mL/min (ref >60)
Glucose, Bld: 432 mg/dL — ABNORMAL HIGH (ref 70–99)
Potassium: 4.8 mmol/L (ref 3.5–5.1)
Sodium: 135 mmol/L (ref 135–145)

## 2019-04-23 LAB — CBG MONITORING, ED: Glucose-Capillary: 370 mg/dL — ABNORMAL HIGH (ref 70–99)

## 2019-04-23 LAB — SARS CORONAVIRUS 2 BY RT PCR (HOSPITAL ORDER, PERFORMED IN ~~LOC~~ HOSPITAL LAB): SARS Coronavirus 2: NEGATIVE

## 2019-04-23 LAB — PROTIME-INR
INR: 1.3 — ABNORMAL HIGH (ref 0.8–1.2)
Prothrombin Time: 15.9 seconds — ABNORMAL HIGH (ref 11.4–15.2)

## 2019-04-23 MED ORDER — ONDANSETRON HCL 4 MG/2ML IJ SOLN
4.0000 mg | Freq: Once | INTRAMUSCULAR | Status: AC
  Administered 2019-04-23: 4 mg via INTRAVENOUS
  Filled 2019-04-23: qty 2

## 2019-04-23 MED ORDER — SODIUM CHLORIDE 0.9 % IV BOLUS
1000.0000 mL | Freq: Once | INTRAVENOUS | Status: AC
  Administered 2019-04-23: 1000 mL via INTRAVENOUS

## 2019-04-23 MED ORDER — SODIUM CHLORIDE 0.9 % IV SOLN
80.0000 mg | Freq: Once | INTRAVENOUS | Status: AC
  Administered 2019-04-23: 80 mg via INTRAVENOUS
  Filled 2019-04-23: qty 80

## 2019-04-23 MED ORDER — LORAZEPAM 0.5 MG PO TABS
0.5000 mg | ORAL_TABLET | Freq: Four times a day (QID) | ORAL | Status: DC | PRN
  Administered 2019-04-23: 0.5 mg via ORAL
  Filled 2019-04-23: qty 1

## 2019-04-23 MED ORDER — SODIUM CHLORIDE 0.9 % IV SOLN
8.0000 mg/h | INTRAVENOUS | Status: DC
  Administered 2019-04-23 – 2019-04-26 (×6): 8 mg/h via INTRAVENOUS
  Filled 2019-04-23 (×11): qty 80

## 2019-04-23 MED ORDER — PROMETHAZINE HCL 25 MG/ML IJ SOLN
12.5000 mg | Freq: Four times a day (QID) | INTRAMUSCULAR | Status: DC | PRN
  Administered 2019-04-23: 12.5 mg via INTRAVENOUS
  Filled 2019-04-23: qty 1

## 2019-04-23 NOTE — H&P (Signed)
History and Physical    HARINDER ROMAS WER:[id number] DOB: Jan 11, 1946 DOA: 04/23/2019  PCP: Luetta Nutting, DO   Patient coming from: Home   Chief Complaint: Black stool and vomitus, high blood sugars, lightheaded   HPI: David Lane is a 73 y.o. male with medical history significant for coronary artery disease, chronic combined systolic and diastolic CHF, pancreatic insufficiency, insulin-dependent diabetes mellitus, and hypertension, now presenting to the emergency department for evaluation of melena, dark vomitus, lightheadedness, and elevated blood sugars.  Patient reports that he was in his usual state of health yesterday until the evening when he developed a mild nausea.  He went on to experience burning discomfort in the epigastrium that did not respond to multiple doses of Pepto-Bismol, noted dark stools that he was attributing to the Pepto-Bismol, went on to vomit some dark material, and has been lightheaded.  Patient also reports blood sugars in the 400 range despite continued adherence with his insulin at home.  Patient denies any use of NSAIDs.  He has been lightheaded upon standing today, describing near syncope, but has not lost consciousness.  He was previously taking a daily PPI, but not in a few months.  He takes aspirin 81 mg and Brilinta daily.  Most recent stent was drug-eluting and placed in March 2019.  He has not experienced any fevers, chills, shortness of breath, chest pain, cough, or swelling recently.  Patient called EMS this evening, reports that the paramedics noted orthostatic hypotension, and he was brought into the ED.  ED Course: Upon arrival to the ED, patient is found to be afebrile, saturating well on room air, heart rate 99, and systolic blood pressure of 101.  EKG features a sinus rhythm with left axis deviation.  Chest x-ray is negative for acute cardiopulmonary disease.  Chemistry panel is notable for a BUN of 56, creatinine 1.18, AST 50, ALT 91, and glucose  432.  CBC is notable for hemoglobin of 10.3, down from 12.7 and January.  Fecal occult blood testing is positive.  Gastroenterology was consulted by the ED physician, patient was started on IV Protonix, and a medical admission was recommended.  Review of Systems:  All other systems reviewed and apart from HPI, are negative.  Past Medical History:  Diagnosis Date   CAD (coronary artery disease)    last cath by [facility] DR.  Mihai Croitoru showing  some  disease involving LCX and small size of Diag    CHF exacerbation, due to diastolic dysfunction 19/50/9326   Chronic systolic CHF (congestive heart failure), NYHA class 1 ([place]) 07/17/2012   GERD (gastroesophageal reflux disease)    Hepatic lesion 02/04/11   Hyperlipemia    Hypertension    Ischemic cardiomyopathy    EF 35-40%   Liver hemangioma    NSTEMI (non-ST elevated myocardial infarction) ([place]) 11/21/2009   Pancreatitis 2000's   Shortness of breath    Type II diabetes mellitus ([place])     Past Surgical History:  Procedure Laterality Date   AMPUTATION Left 01/14/2018   Procedure: TRANSMETATARSAL AMPUTATION;  Surgeon: Newt Minion, MD;  Location: [place];  Service: Orthopedics;  Laterality: Left;   CARDIAC CATHETERIZATION  2011   minimal disease, medical management   CORONARY ANGIOPLASTY WITH STENT PLACEMENT  07/14/2012   successful PCI & stenting of mid LAD & PDA off the dominant CX   CORONARY/GRAFT ACUTE MI REVASCULARIZATION N/A 12/20/2017   Procedure: Coronary/Graft Acute MI Revascularization;  Surgeon: Martinique, Peter M, MD;  Location: [place]  CV LAB;  Service: Cardiovascular;  Laterality: N/A;   INCISION AND DRAINAGE ABSCESS ANAL  1970's   LEFT HEART CATH AND CORONARY ANGIOGRAPHY N/A 12/20/2017   Procedure: LEFT HEART CATH AND CORONARY ANGIOGRAPHY;  Surgeon: Martinique, Peter M, MD;  Location: [place] CV LAB;  Service: Cardiovascular;  Laterality: N/A;   LEFT HEART CATHETERIZATION WITH CORONARY ANGIOGRAM N/A  07/14/2012   Procedure: LEFT HEART CATHETERIZATION WITH CORONARY ANGIOGRAM;  Surgeon: [name], MD;  Location: [place] CATH LAB;  Service: Cardiovascular;  Laterality: N/A;   PERCUTANEOUS CORONARY STENT INTERVENTION (PCI-S) Right 07/14/2012   Procedure: PERCUTANEOUS CORONARY STENT INTERVENTION (PCI-S);  Surgeon: [name], MD;  Location: [facility] CATH LAB;  Service: Cardiovascular;  Laterality: Right;     reports that he has never smoked. He has never used smokeless tobacco. He reports that he does not drink alcohol or use drugs.  Allergies  Allergen Reactions   Lipitor [Atorvastatin] Other (See Comments)    Muscle weakness   Metformin And Related Diarrhea    Severe diarrhea    Family History  Problem Relation Age of Onset   Diabetes Mother    Diabetes Paternal Grandmother    Diabetes Maternal Grandfather    Colon cancer Neg Hx      Prior to Admission medications   Medication Sig Start Date End Date Taking? Authorizing Provider  aspirin 81 MG tablet Take 81 mg by mouth daily.     Yes [provider]  carvedilol (COREG) 6.25 MG tablet Take 6.25 mg by mouth 2 (two) times daily with a meal.   Yes [provider]  escitalopram (LEXAPRO) 5 MG tablet Take 1 tablet (5 mg total) by mouth daily. 10/06/18  Yes Luetta Nutting, DO  insulin aspart (NOVOLOG FLEXPEN) 100 UNIT/ML FlexPen Inject 2 Units into the skin 3 (three) times daily with meals. Take only if your blood sugar is over 400. Patient taking differently: Inject 5-7 Units into the skin 3 (three) times daily as needed for high blood sugar. Take only if your blood sugar is over 400. 03/29/19  Yes Renato Shin, MD  LANTUS SOLOSTAR 100 UNIT/ML Solostar Pen INJECT [place] MORNING Patient taking differently: Inject 65 Units into the skin daily.  04/06/19  Yes Renato Shin, MD  Multiple Vitamins-Minerals (CENTRUM SILVER ULTRA MENS PO) Take 1 tablet by mouth daily.    Yes [provider]    Pancrelipase, Lip-Prot-Amyl, (CREON) 24000-76000 units CPEP TAKE 2 CAPSULES WITH EACH MEAL AND 1 CAPSULE WITH SMALL SNACKS (10 CAPS PER DAY) Patient taking differently: Take 2 capsules by mouth 3 (three) times daily as needed (digestion).  10/06/18  Yes Luetta Nutting, DO  rosuvastatin (CRESTOR) 40 MG tablet TAKE 1 TABLET(40 MG) BY MOUTH AT BEDTIME Patient taking differently: Take 40 mg by mouth every evening.  02/23/19  Yes Croitoru, Mihai, MD  ticagrelor (BRILINTA) 90 MG TABS tablet Take 1 tablet (90 mg total) by mouth 2 (two) times daily. 03/15/19  Yes Croitoru, Mihai, MD  glucose blood (ONE TOUCH ULTRA TEST) test strip USE 1 STRIP TO CHECK GLUCOSE 4 TIMES DAILY; K86.89, E10.8 09/25/18   Renato Shin, MD  Insulin Pen Needle (B-D UF III MINI PEN NEEDLES) 31G X 5 MM MISC use to inject insulin daily 03/15/19   Renato Shin, MD  pantoprazole (PROTONIX) 40 MG tablet Take 1 tablet (40 mg total) by mouth daily. Patient not taking: Reported on 04/23/2019 01/27/18   Bary Leriche, PA-C    Physical Exam:  Vitals:   04/23/19 1815 04/23/19 1822 04/23/19 1830  BP:  101/68 101/63  Pulse:  97 99  Resp:  13 18  Temp:  98.1 F (36.7 C)   TempSrc:  Oral   SpO2: 100% 96% 99%    Constitutional: NAD, calm  Eyes: PERTLA, lids and conjunctivae normal ENMT: Mucous membranes are moist. Posterior pharynx clear of any exudate or lesions.   Neck: normal, supple, no masses, no thyromegaly Respiratory: clear to auscultation bilaterally, no wheezing, no crackles. No accessory muscle use.  Cardiovascular: S1 & S2 heard, regular rate and rhythm. No extremity edema.  Abdomen: No distension, no tenderness, soft. Bowel sounds active.  Musculoskeletal: no clubbing / cyanosis. Status-post left TMA.    Skin: no significant rashes, lesions, ulcers. Warm, dry, well-perfused. Neurologic: CN 2-12 grossly intact. Sensation intact. Strength 5/5 in all 4 limbs.  Psychiatric: Alert and oriented x 3. Pleasant, cooperative.     Labs on Admission: I have personally reviewed following labs and imaging studies  CBC: Recent Labs  Lab 04/23/19 1829  WBC 10.4  HGB 10.3*  HCT 31.2*  MCV 95.7  PLT 295   Basic Metabolic Panel: Recent Labs  Lab 04/23/19 1829  NA 135  K 4.8  CL 101  CO2 22  GLUCOSE 432*  BUN 56*  CREATININE 1.18  CALCIUM 8.2*   GFR: CrCl cannot be calculated (Unknown ideal weight.). Liver Function Tests: Recent Labs  Lab 04/23/19 2010  AST 50*  ALT 91*  ALKPHOS 61  BILITOT 0.9  PROT 6.2*  ALBUMIN 3.3*   No results for input(s): LIPASE, AMYLASE in the last 168 hours. No results for input(s): AMMONIA in the last 168 hours. Coagulation Profile: Recent Labs  Lab 04/23/19 2010  INR 1.3*   Cardiac Enzymes: No results for input(s): CKTOTAL, CKMB, CKMBINDEX, TROPONINI in the last 168 hours. BNP (last 3 results) No results for input(s): PROBNP in the last 8760 hours. HbA1C: No results for input(s): HGBA1C in the last 72 hours. CBG: Recent Labs  Lab 04/23/19 1841  GLUCAP 370*   Lipid Profile: No results for input(s): CHOL, HDL, LDLCALC, TRIG, CHOLHDL, LDLDIRECT in the last 72 hours. Thyroid Function Tests: No results for input(s): TSH, T4TOTAL, FREET4, T3FREE, THYROIDAB in the last 72 hours. Anemia Panel: No results for input(s): VITAMINB12, FOLATE, FERRITIN, TIBC, IRON, RETICCTPCT in the last 72 hours. Urine analysis:    Component Value Date/Time   COLORURINE YELLOW 11/25/2014 1339   APPEARANCEUR CLEAR 11/25/2014 1339   LABSPEC 1.020 11/25/2014 1339   PHURINE 5.5 11/25/2014 1339   GLUCOSEU >=1000 (A) 11/25/2014 1339   HGBUR TRACE-LYSED (A) 11/25/2014 1339   HGBUR negative 08/03/2010 0919   BILIRUBINUR NEGATIVE 11/25/2014 1339   KETONESUR NEGATIVE 11/25/2014 1339   PROTEINUR NEGATIVE 10/18/2012 1358   UROBILINOGEN 0.2 11/25/2014 1339   NITRITE NEGATIVE 11/25/2014 1339   LEUKOCYTESUR NEGATIVE 11/25/2014 1339   Sepsis  Labs: @LABRCNTIP (procalcitonin:4,lacticidven:4) )No results found for this or any previous visit (from the past 240 hour(s)).   Radiological Exams on Admission: Dg Chest Port 1 View  Result Date: 04/23/2019 CLINICAL DATA:  Weakness, GI bleed EXAM: PORTABLE CHEST 1 VIEW COMPARISON:  01/12/2018 FINDINGS: The heart size and mediastinal contours are within normal limits. Left coronary stent. Both lungs are clear. The visualized skeletal structures are unremarkable. IMPRESSION: No active disease. Electronically Signed   By: Franchot Gallo M.D.   On: 04/23/2019 20:47    EKG: Independently reviewed. Sinus rhythm, LAD.   Assessment/Plan   1.  Upper GI bleeding; anemia - Presents with ~24 hrs of burning epigastric discomfort, melena, and dark vomitus  - SBP 100 and HR 100 in ED, given a liter of NS  - Initial Hgb is 10.3, down from 12.7 in January 2020  - Normal esophagus, stomach, and duodenum on EGD from 2017  - Started on IV PPI in ED and GI was consulted  - Continue NPO, IV Protonix, type and screen, trend H&H, hold antiplatelets initially    2. Insulin-dependent DM  - A1c was 9.1% in July 2020  - Continue Lantus with Novolog correctional    3. CAD  - No anginal complaints  - Antiplatelets held on admission in setting of acute GIB    4. Chronic combined systolic & diastolic CHF   - Appears compensated  - Given a liter NS bolus in ED in setting of acute GIB with orthostasis  - Follow daily wt and I/O's     PPE: Mask, face shield  DVT prophylaxis: SCD's  Code Status: Full  Family Communication: Discussed with patient   Consults called: GI  Admission status: Observation    Vianne Bulls, MD [facility] Pager [phone]  If 7PM-7AM, please contact night-coverage [email or web address] Password [facility]  04/23/2019, 9:53 PM

## 2019-04-23 NOTE — ED Notes (Signed)
Pt called out needing to have a bowel movement. Provided pt with bedside potty.

## 2019-04-23 NOTE — ED Notes (Signed)
Pt aware urine sample needed and will use call light when he is able to provide.

## 2019-04-23 NOTE — ED Provider Notes (Signed)
[place] DEPT Provider Note   CSN: [id number] Arrival date & time: 04/23/19  1804     History   Chief Complaint Chief Complaint  Patient presents with  . Hyperglycemia  . Dizziness    HPI David Lane is a 73 y.o. male.  He was brought in by ambulance for feeling dizzy lightheaded today.  He said he had some epigastric discomfort last evening and he took Maalox and was unable to sleep lying down so slept in his chair.  He was dizzy lightheaded today and had a little bit of coffee-ground vomitus.  He said his bowel movements were dark but he blames it on the Pepto-Bismol that he took.  No chest pain no fevers.  His blood sugars have also been high today and he has been chasing it with some as needed regular insulin.  Today here in the ED he was on the commode and became lightheaded and vomited coffee-ground material.     The history is provided by the patient.  Dizziness Quality:  Lightheadedness Severity:  Moderate Onset quality:  Gradual Duration:  12 hours Timing:  Intermittent Chronicity:  New Context: physical activity and standing up   Relieved by:  Lying down Worsened by:  Standing up Ineffective treatments:  None tried Associated symptoms: blood in stool (??), diarrhea, nausea, vomiting and weakness   Associated symptoms: no chest pain, no shortness of breath, no syncope and no vision changes   Risk factors: heart disease     Past Medical History:  Diagnosis Date  . CAD (coronary artery disease)    last cath by [facility] DR.  Mihai Croitoru showing  some  disease involving LCX and small size of Diag   . CHF exacerbation, due to diastolic dysfunction 84/16/6063  . Chronic systolic CHF (congestive heart failure), NYHA class 1 ([place]) 07/17/2012  . GERD (gastroesophageal reflux disease)   . Hepatic lesion 02/04/11  . Hyperlipemia   . Hypertension   . Ischemic cardiomyopathy    EF 35-40%  . Liver hemangioma   . NSTEMI (non-ST elevated  myocardial infarction) ([place]) 11/21/2009  . Pancreatitis 2000's  . Shortness of breath   . Type II diabetes mellitus [facility])     Patient Active Problem List   Diagnosis Date Noted  . Type 1 diabetes mellitus with complications ([place]) 01/60/1093  . Dyslipidemia (high LDL; low HDL) 07/01/2018  . Labile blood glucose   . Hypoglycemia   . Functional gait disorder 01/20/2018  . Acute blood loss anemia 01/20/2018  . Status post transmetatarsal amputation of left foot ([place]) 01/20/2018  . Foot ulcer due to secondary DM ([place]) 01/04/2018  . Status post coronary artery stent placement   . Acute ST elevation myocardial infarction (STEMI) involving left anterior descending (LAD) coronary artery ([place]) 12/20/2017  . 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 Wellness examination 11/25/2014  . Screening for prostate cancer 11/25/2014  . Pancreatic insufficiency 01/25/2013  . Ischemic cardiomyopathy, EF 50% by echo 08/01/13 07/19/2012  . Chronic combined systolic and diastolic CHF (congestive heart failure) ([place]) 07/14/2012  . Pulmonary edema, most likely due to diastolic dysfunction 23/55/7322  . CAD, 07/14/12- LAD/PDA DES   . Anxiety 12/12/2009  . Hyperlipidemia 04/24/2007  . Essential hypertension 04/24/2007  . PANCREATITIS, HX OF 04/24/2007    Past Surgical History:  Procedure Laterality Date  . AMPUTATION Left 01/14/2018   Procedure: TRANSMETATARSAL AMPUTATION;  Surgeon: Newt Minion, MD;  Location: [facility];  Service: Orthopedics;  Laterality: Left;  . CARDIAC CATHETERIZATION  2011   minimal disease, medical management  . CORONARY ANGIOPLASTY WITH STENT PLACEMENT  07/14/2012   successful PCI & stenting of mid LAD & PDA off the dominant CX  . CORONARY/GRAFT ACUTE MI REVASCULARIZATION N/A 12/20/2017   Procedure: Coronary/Graft Acute MI Revascularization;  Surgeon: Martinique, Peter M, MD;  Location: [place] CV LAB;  Service: Cardiovascular;  Laterality: N/A;  .  INCISION AND DRAINAGE ABSCESS ANAL  1970's  . LEFT HEART CATH AND CORONARY ANGIOGRAPHY N/A 12/20/2017   Procedure: LEFT HEART CATH AND CORONARY ANGIOGRAPHY;  Surgeon: Martinique, Peter M, MD;  Location: [place] CV LAB;  Service: Cardiovascular;  Laterality: N/A;  . LEFT HEART CATHETERIZATION WITH CORONARY ANGIOGRAM N/A 07/14/2012   Procedure: LEFT HEART CATHETERIZATION WITH CORONARY ANGIOGRAM;  Surgeon: [name], MD;  Location: [place] CATH LAB;  Service: Cardiovascular;  Laterality: N/A;  . PERCUTANEOUS CORONARY STENT INTERVENTION (PCI-S) Right 07/14/2012   Procedure: PERCUTANEOUS CORONARY STENT INTERVENTION (PCI-S);  Surgeon: [name], MD;  Location: [facility] CATH LAB;  Service: Cardiovascular;  Laterality: Right;        Home Medications    Prior to Admission medications   Medication Sig Start Date End Date Taking? Authorizing Provider  aspirin 81 MG tablet Take 81 mg by mouth daily.      [provider]  carvedilol (COREG) 6.25 MG tablet Take 6.25 mg by mouth 2 (two) times daily with a meal.    [provider]  escitalopram (LEXAPRO) 5 MG tablet Take 1 tablet (5 mg total) by mouth daily. 10/06/18   Luetta Nutting, DO  glucose blood (ONE TOUCH ULTRA TEST) test strip USE 1 STRIP TO CHECK GLUCOSE 4 TIMES DAILY; K86.89, E10.8 09/25/18   Renato Shin, MD  insulin aspart (NOVOLOG FLEXPEN) 100 UNIT/ML FlexPen Inject 2 Units into the skin 3 (three) times daily with meals. Take only if your blood sugar is over 400. 03/29/19   Renato Shin, MD  Insulin Pen Needle (B-D UF III MINI PEN NEEDLES) 31G X 5 MM MISC use to inject insulin daily 03/15/19   Renato Shin, MD  LANTUS SOLOSTAR 100 UNIT/ML Solostar Pen INJECT 65 UNITS UNDER THE SKIN EVERY MORNING 04/06/19   Renato Shin, MD  lisinopril (PRINIVIL,ZESTRIL) 20 MG tablet Take 20 mg by mouth daily.    [provider]  Multiple Vitamins-Minerals (CENTRUM SILVER ULTRA MENS PO) Take 1 tablet by mouth daily.     [provider]  ofloxacin (OCUFLOX) 0.3 % ophthalmic solution Place 1 drop into the left eye 4 (four) times daily. 03/07/18   [provider]  Pancrelipase, Lip-Prot-Amyl, (CREON) 24000-76000 units CPEP TAKE 2 CAPSULES WITH EACH MEAL AND 1 CAPSULE WITH SMALL SNACKS (10 CAPS PER DAY) 10/06/18   Luetta Nutting, DO  pantoprazole (PROTONIX) 40 MG tablet Take 1 tablet (40 mg total) by mouth daily. 01/27/18   Love, Ivan Anchors, PA-C  prednisoLONE acetate (PRED FORTE) 1 % ophthalmic suspension Place 1 drop into the left eye 4 (four) times daily. 03/07/18   [provider]  rosuvastatin (CRESTOR) 40 MG tablet TAKE 1 TABLET(40 MG) BY MOUTH AT BEDTIME 02/23/19   Croitoru, Mihai, MD  ticagrelor (BRILINTA) 90 MG TABS tablet Take 1 tablet (90 mg total) by mouth 2 (two) times daily. 03/15/19   Croitoru, Dani Gobble, MD    Family History Family History  Problem Relation Age of Onset  . Diabetes Mother   . Diabetes Paternal Grandmother   . Diabetes Maternal Grandfather   .  Colon cancer Neg Hx     Social History Social History   Tobacco Use  . Smoking status: Never Smoker  . Smokeless tobacco: Never Used  Substance Use Topics  . Alcohol use: No  . Drug use: No     Allergies   Lipitor [atorvastatin] and Metformin and related   Review of Systems Review of Systems  Constitutional: Negative for fever.  HENT: Negative for sore throat.   Eyes: Negative for visual disturbance.  Respiratory: Negative for shortness of breath.   Cardiovascular: Negative for chest pain and syncope.  Gastrointestinal: Positive for abdominal pain, blood in stool (??), diarrhea, nausea and vomiting.  Genitourinary: Negative for dysuria.  Musculoskeletal: Negative for neck pain.  Skin: Negative for rash.  Neurological: Positive for dizziness, weakness and light-headedness.     Physical Exam Updated Vital Signs BP 101/68 (BP Location: Right Arm)   Pulse 97   Temp 98.1 F (36.7 C) (Oral)   Resp 13   SpO2 96%    Physical Exam Vitals signs and nursing note reviewed.  Constitutional:      General: He is not in acute distress.    Appearance: He is well-developed.  HENT:     Head: Normocephalic and atraumatic.  Eyes:     Conjunctiva/sclera: Conjunctivae normal.  Neck:     Musculoskeletal: Neck supple.  Cardiovascular:     Rate and Rhythm: Normal rate and regular rhythm.     Heart sounds: No murmur.  Pulmonary:     Effort: Pulmonary effort is normal. No respiratory distress.     Breath sounds: Normal breath sounds.  Abdominal:     Palpations: Abdomen is soft.     Tenderness: There is no abdominal tenderness.  Musculoskeletal: Normal range of motion.        General: Deformity (left foot TMA) present.     Right lower leg: No edema.     Left lower leg: No edema.  Skin:    General: Skin is warm and dry.     Coloration: Skin is pale.  Neurological:     General: No focal deficit present.     Mental Status: He is alert and oriented to person, place, and time.      ED Treatments / Results  Labs (all labs ordered are listed, but only abnormal results are displayed) Labs Reviewed  BASIC METABOLIC PANEL - Abnormal; Notable for the following components:      Result Value   Glucose, Bld 432 (*)    BUN 56 (*)    Calcium 8.2 (*)    All other components within normal limits  CBC - Abnormal; Notable for the following components:   RBC 3.26 (*)    Hemoglobin 10.3 (*)    HCT 31.2 (*)    All other components within normal limits  URINALYSIS, ROUTINE W REFLEX MICROSCOPIC - Abnormal; Notable for the following components:   Color, Urine STRAW (*)    Glucose, UA >=500 (*)    Ketones, ur 20 (*)    Bacteria, UA RARE (*)    All other components within normal limits  PROTIME-INR - Abnormal; Notable for the following components:   Prothrombin Time 15.9 (*)    INR 1.3 (*)    All other components within normal limits  HEPATIC FUNCTION PANEL - Abnormal; Notable for the following components:   Total  Protein 6.2 (*)    Albumin 3.3 (*)    AST 50 (*)    ALT 91 (*)  All other components within normal limits  COMPREHENSIVE METABOLIC PANEL - Abnormal; Notable for the following components:   Sodium 134 (*)    Glucose, Bld 481 (*)    BUN 75 (*)    Creatinine, Ser 1.39 (*)    Calcium 8.0 (*)    Total Protein 5.8 (*)    Albumin 3.1 (*)    ALT 76 (*)    GFR calc non Af Amer 51 (*)    GFR calc Af Amer 59 (*)    All other components within normal limits  HEMOGLOBIN - Abnormal; Notable for the following components:   Hemoglobin 8.4 (*)    All other components within normal limits  HEMATOCRIT - Abnormal; Notable for the following components:   HCT 24.7 (*)    All other components within normal limits  GLUCOSE, CAPILLARY - Abnormal; Notable for the following components:   Glucose-Capillary 410 (*)    All other components within normal limits  GLUCOSE, CAPILLARY - Abnormal; Notable for the following components:   Glucose-Capillary 470 (*)    All other components within normal limits  GLUCOSE, CAPILLARY - Abnormal; Notable for the following components:   Glucose-Capillary 334 (*)    All other components within normal limits  GLUCOSE, CAPILLARY - Abnormal; Notable for the following components:   Glucose-Capillary 298 (*)    All other components within normal limits  HEMOGLOBIN - Abnormal; Notable for the following components:   Hemoglobin 7.4 (*)    All other components within normal limits  HEMATOCRIT - Abnormal; Notable for the following components:   HCT 22.2 (*)    All other components within normal limits  GLUCOSE, CAPILLARY - Abnormal; Notable for the following components:   Glucose-Capillary 213 (*)    All other components within normal limits  CBG MONITORING, ED - Abnormal; Notable for the following components:   Glucose-Capillary 370 (*)    All other components within normal limits  POC OCCULT BLOOD, ED - Abnormal; Notable for the following components:   Fecal Occult Bld  POSITIVE (*)    All other components within normal limits  SARS CORONAVIRUS 2 (HOSPITAL ORDER, [place] [facility])  MRSA PCR SCREENING  HIV ANTIBODY (ROUTINE TESTING W REFLEX)  HEMATOCRIT  HEMOGLOBIN  TYPE AND SCREEN  ABO/RH    EKG EKG Interpretation  Date/Time:  Monday April 23 2019 18:24:52 EDT Ventricular Rate:  95 PR Interval:    QRS Duration: 89 QT Interval:  349 QTC Calculation: 439 R Axis:   -47 Text Interpretation:  Sinus rhythm Left axis deviation Anterolateral infarct, old Baseline wander in lead(s) V1 V6 similar to prior 8/19 Confirmed by Aletta Edouard [phone]) on 04/23/2019 7:06:30 PM   Radiology Dg Chest Port 1 View  Result Date: 04/23/2019 CLINICAL DATA:  Weakness, GI bleed EXAM: PORTABLE CHEST 1 VIEW COMPARISON:  01/12/2018 FINDINGS: The heart size and mediastinal contours are within normal limits. Left coronary stent. Both lungs are clear. The visualized skeletal structures are unremarkable. IMPRESSION: No active disease. Electronically Signed   By: Franchot Gallo M.D.   On: 04/23/2019 20:47    Procedures .Critical Care Performed by: Hayden Rasmussen, MD Authorized by: Hayden Rasmussen, MD   Critical care provider statement:    Critical care time (minutes):  45   Critical care was necessary to treat or prevent imminent or life-threatening deterioration of the following conditions:  Circulatory failure   Critical care was time spent personally by me on the following activities:  Discussions with consultants, evaluation of patient's response to treatment, examination of patient, ordering and performing treatments and interventions, ordering and review of laboratory studies, ordering and review of radiographic studies, pulse oximetry, re-evaluation of patient's condition, obtaining history from patient or surrogate, review of old charts and development of treatment plan with patient or surrogate   (including critical care time)  Medications  Ordered in ED Medications  sodium chloride 0.9 % bolus 1,000 mL (has no administration in time range)  pantoprazole (PROTONIX) 80 mg in sodium chloride 0.9 % 100 mL IVPB (has no administration in time range)  pantoprazole (PROTONIX) 80 mg in sodium chloride 0.9 % 250 mL (0.32 mg/mL) infusion (has no administration in time range)  ondansetron (ZOFRAN) injection 4 mg (has no administration in time range)     Initial Impression / Assessment and Plan / ED Course  I have reviewed the triage vital signs and the nursing notes.  Pertinent labs & imaging results that were available during my care of the patient were reviewed by me and considered in my medical decision making (see chart for details).  Clinical Course as of Apr 23 1302  Mon Apr 22, 3318  1654 73 year old male diabetic on Brilinta for coronary disease here with lightheadedness dizziness hyperglycemia and some coffee-ground vomitus and possible melena.  We will work on getting a second IV on him and have ordered him IV Protonix drip and bolus.  Initial hemoglobin 10.3.  Differential includes GI bleed, ACS, anemia, hyperglycemia/dehydration, infection, Covid   [MB]  2043 Patient's last upper endoscopy was 4/17 by Dr. Lorenda Ishihara which showed a normal esophagus and a phytobezoar in the stomach but otherwise unremarkable study.   [MB]  2045 Patient does not take any NSAIDs and denies smoking or drinking alcohol.  He is on Brilinta so I think you will need to be admitted to the hospital for probable endoscopy.  GI has been paged.   [MB]  2138 Discussed with Dr. Enis Gash from [facility].  He agrees with Protonix drip and keep him n.p.o. after midnight and they will consult on tomorrow.  He asked that the hospitalist contact him overnight if there is any change hemodynamically.   [MB]  2156 Discussed with Dr. Myna Hidalgo from the hospitalist service who will admit the patient.   [MB]    Clinical Course User Index [MB] Hayden Rasmussen, MD    David Lane was evaluated in Emergency Department on 04/23/2019 for the symptoms described in the history of present illness. He was evaluated in the context of the global COVID-19 pandemic, which necessitated consideration that the patient might be at risk for infection with the SARS-CoV-2 virus that causes COVID-19. Institutional protocols and algorithms that pertain to the evaluation of patients at risk for COVID-19 are in a state of rapid change based on information released by regulatory bodies including the CDC and federal and state organizations. These policies and algorithms were followed during the patient's care in the ED.      Final Clinical Impressions(s) / ED Diagnoses   Final diagnoses:  Acute upper GI bleed  Dizziness  Near syncope  Hyperglycemia    ED Discharge Orders    None       Hayden Rasmussen, MD 04/24/19 1306

## 2019-04-23 NOTE — ED Notes (Signed)
Pt had vomiting episode in which he filled an emesis bag with dark, coffee-ground-textured vomit. Pt feeling significantly worse. RN aware and notified admitting provider.

## 2019-04-23 NOTE — ED Notes (Signed)
Admitting dr at bedside.  

## 2019-04-23 NOTE — ED Triage Notes (Signed)
Pt states blood sugar has been over 400. Has been compliant with meds. Pt c/o polyuria and nausea / vomiting.

## 2019-04-23 NOTE — ED Notes (Signed)
Buter, MD made aware of positive POC occult blood

## 2019-04-23 NOTE — ED Triage Notes (Signed)
Patient arrived by EMS from home. Pt c/o hyperglycemia and dizziness. Patient gets dizzy when he stands up. Pt has had black stools and black vomit per patient statement.  Hx of DM.   Pt took insulin at home.

## 2019-04-24 ENCOUNTER — Encounter (HOSPITAL_COMMUNITY): Payer: Self-pay | Admitting: Certified Registered Nurse Anesthetist

## 2019-04-24 ENCOUNTER — Inpatient Hospital Stay (HOSPITAL_COMMUNITY): Payer: Medicare Other

## 2019-04-24 ENCOUNTER — Observation Stay (HOSPITAL_COMMUNITY): Payer: Medicare Other | Admitting: Anesthesiology

## 2019-04-24 ENCOUNTER — Other Ambulatory Visit: Payer: Self-pay

## 2019-04-24 ENCOUNTER — Encounter (HOSPITAL_COMMUNITY)
Admission: EM | Disposition: A | Discharge status: Home / Self Care | Payer: Self-pay | Source: Home / Self Care | Attending: Internal Medicine

## 2019-04-24 DIAGNOSIS — I10 Essential (primary) hypertension: Secondary | ICD-10-CM | POA: Diagnosis not present

## 2019-04-24 DIAGNOSIS — K2100 Gastro-esophageal reflux disease with esophagitis, without bleeding: Secondary | ICD-10-CM

## 2019-04-24 DIAGNOSIS — K635 Polyp of colon: Secondary | ICD-10-CM | POA: Diagnosis present

## 2019-04-24 DIAGNOSIS — R55 Syncope and collapse: Secondary | ICD-10-CM | POA: Diagnosis present

## 2019-04-24 DIAGNOSIS — K2980 Duodenitis without bleeding: Secondary | ICD-10-CM | POA: Diagnosis present

## 2019-04-24 DIAGNOSIS — Z86010 Personal history of colonic polyps: Secondary | ICD-10-CM | POA: Diagnosis not present

## 2019-04-24 DIAGNOSIS — K641 Second degree hemorrhoids: Secondary | ICD-10-CM | POA: Diagnosis present

## 2019-04-24 DIAGNOSIS — Z7902 Long term (current) use of antithrombotics/antiplatelets: Secondary | ICD-10-CM | POA: Diagnosis not present

## 2019-04-24 DIAGNOSIS — Z20828 Contact with and (suspected) exposure to other viral communicable diseases: Secondary | ICD-10-CM | POA: Diagnosis present

## 2019-04-24 DIAGNOSIS — K254 Chronic or unspecified gastric ulcer with hemorrhage: Secondary | ICD-10-CM | POA: Diagnosis present

## 2019-04-24 DIAGNOSIS — K8689 Other specified diseases of pancreas: Secondary | ICD-10-CM | POA: Diagnosis not present

## 2019-04-24 DIAGNOSIS — E785 Hyperlipidemia, unspecified: Secondary | ICD-10-CM | POA: Diagnosis present

## 2019-04-24 DIAGNOSIS — Z955 Presence of coronary angioplasty implant and graft: Secondary | ICD-10-CM | POA: Diagnosis not present

## 2019-04-24 DIAGNOSIS — I252 Old myocardial infarction: Secondary | ICD-10-CM | POA: Diagnosis not present

## 2019-04-24 DIAGNOSIS — Z89422 Acquired absence of other left toe(s): Secondary | ICD-10-CM | POA: Diagnosis not present

## 2019-04-24 DIAGNOSIS — K259 Gastric ulcer, unspecified as acute or chronic, without hemorrhage or perforation: Secondary | ICD-10-CM | POA: Diagnosis not present

## 2019-04-24 DIAGNOSIS — K922 Gastrointestinal hemorrhage, unspecified: Secondary | ICD-10-CM | POA: Diagnosis not present

## 2019-04-24 DIAGNOSIS — Z833 Family history of diabetes mellitus: Secondary | ICD-10-CM | POA: Diagnosis not present

## 2019-04-24 DIAGNOSIS — K5520 Angiodysplasia of colon without hemorrhage: Secondary | ICD-10-CM | POA: Diagnosis not present

## 2019-04-24 DIAGNOSIS — I110 Hypertensive heart disease with heart failure: Secondary | ICD-10-CM | POA: Diagnosis present

## 2019-04-24 DIAGNOSIS — Z79899 Other long term (current) drug therapy: Secondary | ICD-10-CM | POA: Diagnosis not present

## 2019-04-24 DIAGNOSIS — K861 Other chronic pancreatitis: Secondary | ICD-10-CM | POA: Diagnosis present

## 2019-04-24 DIAGNOSIS — Z794 Long term (current) use of insulin: Secondary | ICD-10-CM | POA: Diagnosis not present

## 2019-04-24 DIAGNOSIS — E1065 Type 1 diabetes mellitus with hyperglycemia: Secondary | ICD-10-CM | POA: Diagnosis present

## 2019-04-24 DIAGNOSIS — D62 Acute posthemorrhagic anemia: Secondary | ICD-10-CM

## 2019-04-24 DIAGNOSIS — I2510 Atherosclerotic heart disease of native coronary artery without angina pectoris: Secondary | ICD-10-CM | POA: Diagnosis present

## 2019-04-24 DIAGNOSIS — K317 Polyp of stomach and duodenum: Secondary | ICD-10-CM | POA: Diagnosis present

## 2019-04-24 DIAGNOSIS — K3189 Other diseases of stomach and duodenum: Secondary | ICD-10-CM | POA: Diagnosis not present

## 2019-04-24 DIAGNOSIS — K6389 Other specified diseases of intestine: Secondary | ICD-10-CM | POA: Diagnosis not present

## 2019-04-24 DIAGNOSIS — I5042 Chronic combined systolic (congestive) and diastolic (congestive) heart failure: Secondary | ICD-10-CM | POA: Diagnosis present

## 2019-04-24 DIAGNOSIS — E108 Type 1 diabetes mellitus with unspecified complications: Secondary | ICD-10-CM | POA: Diagnosis not present

## 2019-04-24 DIAGNOSIS — Z7982 Long term (current) use of aspirin: Secondary | ICD-10-CM | POA: Diagnosis not present

## 2019-04-24 DIAGNOSIS — I255 Ischemic cardiomyopathy: Secondary | ICD-10-CM | POA: Diagnosis present

## 2019-04-24 HISTORY — PX: ESOPHAGOGASTRODUODENOSCOPY: SHX5428

## 2019-04-24 LAB — COMPREHENSIVE METABOLIC PANEL
ALT: 76 U/L — ABNORMAL HIGH (ref 0–44)
AST: 35 U/L (ref 15–41)
Albumin: 3.1 g/dL — ABNORMAL LOW (ref 3.5–5.0)
Alkaline Phosphatase: 54 U/L (ref 38–126)
Anion gap: 11 (ref 5–15)
BUN: 75 mg/dL — ABNORMAL HIGH (ref 8–23)
CO2: 22 mmol/L (ref 22–32)
Calcium: 8 mg/dL — ABNORMAL LOW (ref 8.9–10.3)
Chloride: 101 mmol/L (ref 98–111)
Creatinine, Ser: 1.39 mg/dL — ABNORMAL HIGH (ref 0.61–1.24)
GFR calc Af Amer: 59 mL/min — ABNORMAL LOW (ref >60)
GFR calc non Af Amer: 51 mL/min — ABNORMAL LOW (ref >60)
Glucose, Bld: 481 mg/dL — ABNORMAL HIGH (ref 70–99)
Potassium: 4.6 mmol/L (ref 3.5–5.1)
Sodium: 134 mmol/L — ABNORMAL LOW (ref 135–145)
Total Bilirubin: 1.2 mg/dL (ref 0.3–1.2)
Total Protein: 5.8 g/dL — ABNORMAL LOW (ref 6.5–8.1)

## 2019-04-24 LAB — URINALYSIS, ROUTINE W REFLEX MICROSCOPIC
Bilirubin Urine: NEGATIVE
Glucose, UA: >=500 mg/dL — AB
Hgb urine dipstick: NEGATIVE
Ketones, ur: 20 mg/dL — AB
Leukocytes,Ua: NEGATIVE
Nitrite: NEGATIVE
Protein, ur: NEGATIVE mg/dL
Specific Gravity, Urine: 1.021 (ref 1.005–1.030)
pH: 5 (ref 5.0–8.0)

## 2019-04-24 LAB — PREPARE RBC (CROSSMATCH)

## 2019-04-24 LAB — HEMOGLOBIN
Hemoglobin: 8.4 g/dL — ABNORMAL LOW (ref 13.0–17.0)
Hemoglobin: 7.4 g/dL — ABNORMAL LOW (ref 13.0–17.0)

## 2019-04-24 LAB — GLUCOSE, CAPILLARY
Glucose-Capillary: 183 mg/dL — ABNORMAL HIGH (ref 70–99)
Glucose-Capillary: 213 mg/dL — ABNORMAL HIGH (ref 70–99)
Glucose-Capillary: 227 mg/dL — ABNORMAL HIGH (ref 70–99)
Glucose-Capillary: 298 mg/dL — ABNORMAL HIGH (ref 70–99)
Glucose-Capillary: 299 mg/dL — ABNORMAL HIGH (ref 70–99)
Glucose-Capillary: 334 mg/dL — ABNORMAL HIGH (ref 70–99)
Glucose-Capillary: 410 mg/dL — ABNORMAL HIGH (ref 70–99)
Glucose-Capillary: 470 mg/dL — ABNORMAL HIGH (ref 70–99)

## 2019-04-24 LAB — HEMOGLOBIN AND HEMATOCRIT, BLOOD
HCT: 20.9 % — ABNORMAL LOW (ref 39.0–52.0)
Hemoglobin: 7.1 g/dL — ABNORMAL LOW (ref 13.0–17.0)

## 2019-04-24 LAB — MRSA PCR SCREENING: MRSA by PCR: NEGATIVE

## 2019-04-24 LAB — HEMATOCRIT
HCT: 24.7 % — ABNORMAL LOW (ref 39.0–52.0)
HCT: 22.2 % — ABNORMAL LOW (ref 39.0–52.0)

## 2019-04-24 LAB — ABO/RH: ABO/RH(D): O POS

## 2019-04-24 LAB — HIV ANTIBODY (ROUTINE TESTING W REFLEX): HIV Screen 4th Generation wRfx: NONREACTIVE

## 2019-04-24 SURGERY — EGD (ESOPHAGOGASTRODUODENOSCOPY)
Anesthesia: Monitor Anesthesia Care | Laterality: Left

## 2019-04-24 MED ORDER — ACETAMINOPHEN 325 MG PO TABS
650.0000 mg | ORAL_TABLET | Freq: Once | ORAL | Status: AC
  Administered 2019-04-24: 650 mg via ORAL
  Filled 2019-04-24: qty 2

## 2019-04-24 MED ORDER — INSULIN ASPART 100 UNIT/ML ~~LOC~~ SOLN
0.0000 [IU] | SUBCUTANEOUS | Status: DC
  Administered 2019-04-24: 2 [IU] via SUBCUTANEOUS
  Administered 2019-04-24: 7 [IU] via SUBCUTANEOUS
  Administered 2019-04-24: 3 [IU] via SUBCUTANEOUS
  Administered 2019-04-24: 5 [IU] via SUBCUTANEOUS
  Administered 2019-04-24: 9 [IU] via SUBCUTANEOUS
  Administered 2019-04-25 (×2): 2 [IU] via SUBCUTANEOUS
  Administered 2019-04-25: 5 [IU] via SUBCUTANEOUS
  Administered 2019-04-25: 3 [IU] via SUBCUTANEOUS
  Administered 2019-04-25: 2 [IU] via SUBCUTANEOUS
  Administered 2019-04-25: 3 [IU] via SUBCUTANEOUS
  Administered 2019-04-26 (×3): 2 [IU] via SUBCUTANEOUS
  Administered 2019-04-26: 1 [IU] via SUBCUTANEOUS
  Administered 2019-04-26 – 2019-04-27 (×2): 3 [IU] via SUBCUTANEOUS
  Administered 2019-04-27: 1 [IU] via SUBCUTANEOUS
  Administered 2019-04-27: 5 [IU] via SUBCUTANEOUS
  Administered 2019-04-27: 1 [IU] via SUBCUTANEOUS
  Filled 2019-04-24: qty 0.09

## 2019-04-24 MED ORDER — ACETAMINOPHEN 650 MG RE SUPP: 650.0000 mg | Freq: Four times a day (QID) | RECTAL | Status: DC | PRN

## 2019-04-24 MED ORDER — PHENYLEPHRINE 40 MCG/ML (10ML) SYRINGE FOR IV PUSH (FOR BLOOD PRESSURE SUPPORT)
PREFILLED_SYRINGE | INTRAVENOUS | Status: DC | PRN
  Administered 2019-04-24 (×2): 40 ug via INTRAVENOUS

## 2019-04-24 MED ORDER — PROPOFOL 10 MG/ML IV BOLUS
INTRAVENOUS | Status: DC | PRN
  Administered 2019-04-24: 10 mg via INTRAVENOUS

## 2019-04-24 MED ORDER — ONDANSETRON HCL 4 MG PO TABS: 4.0000 mg | ORAL_TABLET | Freq: Four times a day (QID) | ORAL | Status: DC | PRN

## 2019-04-24 MED ORDER — ACETAMINOPHEN 325 MG PO TABS: 650.0000 mg | ORAL_TABLET | Freq: Four times a day (QID) | ORAL | Status: DC | PRN

## 2019-04-24 MED ORDER — INSULIN ASPART 100 UNIT/ML ~~LOC~~ SOLN: 8.0000 [IU] | Freq: Once | SUBCUTANEOUS | Status: DC

## 2019-04-24 MED ORDER — INSULIN GLARGINE 100 UNIT/ML ~~LOC~~ SOLN
30.0000 [IU] | Freq: Every day | SUBCUTANEOUS | Status: DC
  Administered 2019-04-24 – 2019-04-27 (×2): 30 [IU] via SUBCUTANEOUS
  Filled 2019-04-24 (×4): qty 0.3

## 2019-04-24 MED ORDER — SODIUM CHLORIDE 0.9 % IV BOLUS
250.0000 mL | Freq: Once | INTRAVENOUS | Status: AC
  Administered 2019-04-24: 250 mL via INTRAVENOUS

## 2019-04-24 MED ORDER — SODIUM CHLORIDE 0.9% FLUSH
3.0000 mL | Freq: Two times a day (BID) | INTRAVENOUS | Status: DC
  Administered 2019-04-24 – 2019-04-26 (×3): 3 mL via INTRAVENOUS

## 2019-04-24 MED ORDER — PROPOFOL 500 MG/50ML IV EMUL
INTRAVENOUS | Status: DC | PRN
  Administered 2019-04-24: 100 ug/kg/min via INTRAVENOUS

## 2019-04-24 MED ORDER — SODIUM CHLORIDE (PF) 0.9 % IJ SOLN
INTRAMUSCULAR | Status: AC
  Filled 2019-04-24: qty 50

## 2019-04-24 MED ORDER — CHLORHEXIDINE GLUCONATE CLOTH 2 % EX PADS
6.0000 | MEDICATED_PAD | Freq: Every day | CUTANEOUS | Status: DC
  Administered 2019-04-24 – 2019-04-27 (×2): 6 via TOPICAL

## 2019-04-24 MED ORDER — ONDANSETRON HCL 4 MG/2ML IJ SOLN: 4.0000 mg | Freq: Four times a day (QID) | INTRAMUSCULAR | Status: DC | PRN

## 2019-04-24 MED ORDER — INSULIN ASPART 100 UNIT/ML ~~LOC~~ SOLN
10.0000 [IU] | Freq: Once | SUBCUTANEOUS | Status: AC
  Administered 2019-04-24: 10 [IU] via SUBCUTANEOUS

## 2019-04-24 MED ORDER — ONDANSETRON HCL 4 MG/2ML IJ SOLN
INTRAMUSCULAR | Status: DC | PRN
  Administered 2019-04-24: 4 mg via INTRAVENOUS

## 2019-04-24 MED ORDER — SODIUM CHLORIDE 0.9% IV SOLUTION: Freq: Once | INTRAVENOUS | Status: DC

## 2019-04-24 MED ORDER — IOHEXOL 300 MG/ML  SOLN: 30.0000 mL | Freq: Once | INTRAMUSCULAR | Status: DC | PRN

## 2019-04-24 MED ORDER — LACTATED RINGERS IV SOLN
INTRAVENOUS | Status: AC | PRN
  Administered 2019-04-24: 1000 mL via INTRAVENOUS

## 2019-04-24 MED ORDER — PROPOFOL 10 MG/ML IV BOLUS
INTRAVENOUS | Status: AC
  Filled 2019-04-24: qty 40

## 2019-04-24 MED ORDER — IOHEXOL 300 MG/ML  SOLN
30.0000 mL | Freq: Once | INTRAMUSCULAR | Status: AC
  Administered 2019-04-24: 30 mL via ORAL

## 2019-04-24 MED ORDER — LIDOCAINE 2% (20 MG/ML) 5 ML SYRINGE
INTRAMUSCULAR | Status: DC | PRN
  Administered 2019-04-24: 60 mg via INTRAVENOUS

## 2019-04-24 MED ORDER — IOHEXOL 300 MG/ML  SOLN
100.0000 mL | Freq: Once | INTRAMUSCULAR | Status: AC | PRN
  Administered 2019-04-24: 100 mL via INTRAVENOUS

## 2019-04-24 MED ORDER — SODIUM CHLORIDE 0.45 % IV SOLN
INTRAVENOUS | Status: DC
  Administered 2019-04-24: 03:00:00 via INTRAVENOUS

## 2019-04-24 NOTE — Anesthesia Postprocedure Evaluation (Signed)
Anesthesia Post Note  Patient: David Lane  Procedure(s) Performed: ESOPHAGOGASTRODUODENOSCOPY (EGD) (Left )     Anesthesia Post Evaluation  Last Vitals:  Vitals:   04/24/19 0930 04/24/19 0940  BP: (!) 90/53 124/60  Pulse: 92 93  Resp: 17 16  Temp:    SpO2: 94% 99%    Last Pain:  Vitals:   04/24/19 0940  TempSrc:   PainSc: 0-No pain                 [name] DAVID

## 2019-04-24 NOTE — Anesthesia Preprocedure Evaluation (Signed)
Anesthesia Evaluation  Patient identified by MRN, date of birth, ID band Patient awake    Reviewed: Allergy & Precautions, NPO status , Patient's Chart, lab work & pertinent test results  Airway Mallampati: I  TM Distance: >3 FB Neck ROM: Full    Dental   Pulmonary    Pulmonary exam normal        Cardiovascular hypertension, Pt. on medications + CAD, + Past MI and + Cardiac Stents  Normal cardiovascular exam     Neuro/Psych Anxiety    GI/Hepatic GERD  Medicated and Controlled,  Endo/Other  diabetes, Type 2, Insulin Dependent  Renal/GU      Musculoskeletal   Abdominal   Peds  Hematology   Anesthesia Other Findings   Reproductive/Obstetrics                             Anesthesia Physical Anesthesia Plan  ASA: III  Anesthesia Plan: MAC   Post-op Pain Management:    Induction: Intravenous  PONV Risk Score and Plan:   Airway Management Planned: Simple Face Mask  Additional Equipment:   Intra-op Plan:   Post-operative Plan:   Informed Consent: I have reviewed the patients History and Physical, chart, labs and discussed the procedure including the risks, benefits and alternatives for the proposed anesthesia with the patient or authorized representative who has indicated his/her understanding and acceptance.       Plan Discussed with: CRNA and Surgeon  Anesthesia Plan Comments:         Anesthesia Quick Evaluation

## 2019-04-24 NOTE — Progress Notes (Signed)
0400 BS 470  Paged on-call triad  Waiting on order clarification

## 2019-04-24 NOTE — Progress Notes (Signed)
BS 410  Unsuccessfully contacted on-call triad x3  9U given per [facility]  Will continue to monitor

## 2019-04-24 NOTE — ED Notes (Signed)
ED TO INPATIENT HANDOFF REPORT  Name/Age/Gender David Lane 73 y.o. male  Code Status    Code Status Orders  (From admission, onward)         Start     Ordered   04/24/19 0041  Full code  Continuous     04/24/19 0040        Code Status History    Date Active Date Inactive Code Status Order ID Comments User Context   01/20/2018 1815 01/28/2018 1341 Full Code [id number]  Flora Lipps Inpatient   01/20/2018 1815 01/20/2018 1815 Full Code [id number]  Flora Lipps Inpatient   01/13/2018 0031 01/20/2018 1802 Full Code [id number]  Karmen Bongo, MD Inpatient   12/20/2017 1729 12/25/2017 1616 Full Code [id number]  [place], Peter M, MD Inpatient   01/09/2014 1715 01/15/2014 1527 Full Code [id number]  Elmarie Shiley, MD Inpatient   07/17/2012 1906 07/19/2012 1925 Full Code [id number]  Cecilie Kicks, NP ED   07/14/2012 0519 07/16/2012 2127 Full Code [id number]  Wendall Mola, RN Inpatient   Advance Care Planning Activity      Home/SNF/Other Home  Chief Complaint Dizziness and Hyperglycemia   Level of Care/Admitting Diagnosis ED Disposition    ED Disposition Condition [facility] Area: [place] [[id number]]  Level of Care: Stepdown [14]  Admit to SDU based on following criteria: Hemodynamic compromise or significant risk of instability:  Patient requiring short term acute titration and management of vasoactive drips, and invasive monitoring (i.e., CVP and Arterial line).  Covid Evaluation: Asymptomatic Screening Protocol (No Symptoms)  Diagnosis: Acute upper GI bleed [517001]  Admitting Physician: Vianne Bulls [[id number]]  Attending Physician: Vianne Bulls [[id number]]  PT Class (Do Not Modify): Observation [104]  PT Acc Code (Do Not Modify): Observation [10022]       Medical History Past Medical History:  Diagnosis Date  . CAD (coronary artery disease)    last cath by [facility] DR.  Mihai Croitoru showing  some  disease  involving LCX and small size of Diag   . CHF exacerbation, due to diastolic dysfunction 46/65/9935  . Chronic systolic CHF (congestive heart failure), NYHA class 1 ([place]) 07/17/2012  . GERD (gastroesophageal reflux disease)   . Hepatic lesion 02/04/11  . Hyperlipemia   . Hypertension   . Ischemic cardiomyopathy    EF 35-40%  . Liver hemangioma   . NSTEMI (non-ST elevated myocardial infarction) ([place]) 11/21/2009  . Pancreatitis 2000's  . Shortness of breath   . Type II diabetes mellitus (HCC)     Allergies Allergies  Allergen Reactions  . Lipitor [Atorvastatin] Other (See Comments)    Muscle weakness  . Metformin And Related Diarrhea    Severe diarrhea    IV Location/Drains/Wounds Patient Lines/Drains/Airways Status   Active Line/Drains/Airways    Name:   Placement date:   Placement time:   Site:   Days:   Peripheral IV 04/23/19 Left Forearm   04/23/19    1836    Forearm   1   Peripheral IV 04/23/19 Right Antecubital   04/23/19    2027    Antecubital   1          Labs/Imaging Results for orders placed or performed during the hospital encounter of 04/23/19 (from the past 48 hour(s))  Basic metabolic panel     Status: Abnormal   Collection Time: 04/23/19  6:29 PM  Result Value Ref Range   Sodium 135 135 -  145 mmol/L   Potassium 4.8 3.5 - 5.1 mmol/L   Chloride 101 98 - 111 mmol/L   CO2 22 22 - 32 mmol/L   Glucose, Bld 432 (H) 70 - 99 mg/dL   BUN 56 (H) 8 - 23 mg/dL   Creatinine, Ser 1.18 0.61 - 1.24 mg/dL   Calcium 8.2 (L) 8.9 - 10.3 mg/dL   GFR calc non Af Amer >60 >60 mL/min   GFR calc Af Amer >60 >60 mL/min   Anion gap 12 5 - 15    Comment: Performed at [facility], [place] [address] [place], [place] [address]  CBC     Status: Abnormal   Collection Time: 04/23/19  6:29 PM  Result Value Ref Range   WBC 10.4 4.0 - 10.5 K/uL   RBC 3.26 (L) 4.22 - 5.81 MIL/uL   Hemoglobin 10.3 (L) 13.0 - 17.0 g/dL   HCT 31.2 (L) 39.0 - 52.0 %   MCV 95.7 80.0 -  100.0 fL   MCH 31.6 26.0 - 34.0 pg   MCHC 33.0 30.0 - 36.0 g/dL   RDW 12.5 11.5 - 15.5 %   Platelets 215 150 - 400 K/uL   nRBC 0.0 0.0 - 0.2 %    Comment: Performed at [facility], [place] [address] [place], [place] [address]  CBG monitoring, ED     Status: Abnormal   Collection Time: 04/23/19  6:41 PM  Result Value Ref Range   Glucose-Capillary 370 (H) 70 - 99 mg/dL  POC occult blood, ED     Status: Abnormal   Collection Time: 04/23/19  7:58 PM  Result Value Ref Range   Fecal Occult Bld POSITIVE (A) NEGATIVE  Protime-INR     Status: Abnormal   Collection Time: 04/23/19  8:10 PM  Result Value Ref Range   Prothrombin Time 15.9 (H) 11.4 - 15.2 seconds   INR 1.3 (H) 0.8 - 1.2    Comment: (NOTE) INR goal varies based on device and disease states. Performed at [facility], [place] [address], [place], [place] [address]   Type and screen [place]     Status: None   Collection Time: 04/23/19  8:10 PM  Result Value Ref Range   ABO/RH(D) O POS    Antibody Screen NEG    Sample Expiration      04/26/2019,2359 Performed at [facility], [place] [address] [place], [place] [address]   Hepatic function panel     Status: Abnormal   Collection Time: 04/23/19  8:10 PM  Result Value Ref Range   Total Protein 6.2 (L) 6.5 - 8.1 g/dL   Albumin 3.3 (L) 3.5 - 5.0 g/dL   AST 50 (H) 15 - 41 U/L   ALT 91 (H) 0 - 44 U/L   Alkaline Phosphatase 61 38 - 126 U/L   Total Bilirubin 0.9 0.3 - 1.2 mg/dL   Bilirubin, Direct 0.1 0.0 - 0.2 mg/dL   Indirect Bilirubin 0.8 0.3 - 0.9 mg/dL    Comment: Performed at [facility], [place] [address] [place], [place] [address]  SARS Coronavirus 2 (CEPHEID - Performed in [place] hospital lab), Hosp Order     Status: None   Collection Time: 04/23/19  8:10 PM   Specimen: Nasopharyngeal Swab  Result Value Ref Range   SARS Coronavirus 2 NEGATIVE NEGATIVE    Comment:  (NOTE) If result is NEGATIVE SARS-CoV-2 target nucleic acids are NOT DETECTED. The SARS-CoV-2  RNA is generally detectable in upper and lower  respiratory specimens during the acute phase of infection. The lowest  concentration of SARS-CoV-2 viral copies this assay can detect is 250  copies / mL. A negative result does not preclude SARS-CoV-2 infection  and should not be used as the sole basis for treatment or other  patient management decisions.  A negative result may occur with  improper specimen collection / handling, submission of specimen other  than nasopharyngeal swab, presence of viral mutation(s) within the  areas targeted by this assay, and inadequate number of viral copies  (<250 copies / mL). A negative result must be combined with clinical  observations, patient history, and epidemiological information. If result is POSITIVE SARS-CoV-2 target nucleic acids are DETECTED. The SARS-CoV-2 RNA is generally detectable in upper and lower  respiratory specimens dur ing the acute phase of infection.  Positive  results are indicative of active infection with SARS-CoV-2.  Clinical  correlation with patient history and other diagnostic information is  necessary to determine patient infection status.  Positive results do  not rule out bacterial infection or co-infection with other viruses. If result is PRESUMPTIVE POSTIVE SARS-CoV-2 nucleic acids MAY BE PRESENT.   A presumptive positive result was obtained on the submitted specimen  and confirmed on repeat testing.  While 2019 novel coronavirus  (SARS-CoV-2) nucleic acids may be present in the submitted sample  additional confirmatory testing may be necessary for epidemiological  and / or clinical management purposes  to differentiate between  SARS-CoV-2 and other Sarbecovirus currently known to infect humans.  If clinically indicated additional testing with an alternate test  methodology [phone]) is advised. The SARS-CoV-2 RNA is  generally  detectable in upper and lower respiratory sp ecimens during the acute  phase of infection. The expected result is Negative. Fact Sheet for Patients:  StrictlyIdeas.no Fact Sheet for Healthcare Providers: BankingDealers.co.za This test is not yet approved or cleared by the [place] FDA and has been authorized for detection and/or diagnosis of SARS-CoV-2 by FDA under an Emergency Use Authorization (EUA).  This EUA will remain in effect (meaning this test can be used) for the duration of the COVID-19 declaration under Section 564(b)(1) of the Act, 21 U.S.C. section 360bbb-3(b)(1), unless the authorization is terminated or revoked sooner. Performed at [facility], [place] [address] [place], [place] [address]   ABO/Rh     Status: None (Preliminary result)   Collection Time: 04/23/19  8:10 PM  Result Value Ref Range   ABO/RH(D)      O POS Performed at [facility], [place] [address], [place], [place] [address]    Dg Chest Port 1 View  Result Date: 04/23/2019 CLINICAL DATA:  Weakness, GI bleed EXAM: PORTABLE CHEST 1 VIEW COMPARISON:  01/12/2018 FINDINGS: The heart size and mediastinal contours are within normal limits. Left coronary stent. Both lungs are clear. The visualized skeletal structures are unremarkable. IMPRESSION: No active disease. Electronically Signed   By: Franchot Gallo M.D.   On: 04/23/2019 20:47    Pending Labs Unresulted Labs (From admission, onward)    Start     Ordered   04/24/19 0500  HIV antibody (Routine Testing)  Tomorrow morning,   R     04/24/19 0040   04/24/19 0500  Comprehensive metabolic panel  Tomorrow morning,   R     04/24/19 0040   04/24/19 0200  Hemoglobin  Now then every 8 hours,   R (with STAT occurrences)  04/24/19 0040   04/24/19 0200  Hematocrit  Now then every 8 hours,   R (with STAT occurrences)     04/24/19 0040   04/23/19 1826  Urinalysis,  Routine w reflex microscopic  Once,   STAT     04/23/19 1825          Vitals/Pain Today's Vitals   04/23/19 2130 04/23/19 2232 04/23/19 2330 04/24/19 0021  BP: 124/71 110/74 (!) 111/59 (!) 81/65  Pulse: (!) 102 100 (!) 102 (!) 113  Resp: (!) 21 (!) 22 (!) 22 (!) 21  Temp:      TempSrc:      SpO2: 100% 100% 94% 100%  PainSc:        Isolation Precautions No active isolations  Medications Medications  pantoprazole (PROTONIX) 80 mg in sodium chloride 0.9 % 250 mL (0.32 mg/mL) infusion (8 mg/hr Intravenous New Bag/Given 04/23/19 2127)  insulin glargine (LANTUS) injection 30 Units (has no administration in time range)  insulin aspart (novoLOG) injection 0-9 Units (has no administration in time range)  sodium chloride flush (NS) 0.9 % injection 3 mL (has no administration in time range)  0.45 % sodium chloride infusion (has no administration in time range)  acetaminophen (TYLENOL) tablet 650 mg (has no administration in time range)    Or  acetaminophen (TYLENOL) suppository 650 mg (has no administration in time range)  ondansetron (ZOFRAN) tablet 4 mg (has no administration in time range)    Or  ondansetron (ZOFRAN) injection 4 mg (has no administration in time range)  promethazine (PHENERGAN) injection 12.5 mg (12.5 mg Intravenous Given 04/23/19 2342)  LORazepam (ATIVAN) tablet 0.5 mg (0.5 mg Oral Given 04/23/19 2341)  sodium chloride 0.9 % bolus 250 mL (has no administration in time range)  sodium chloride 0.9 % bolus 1,000 mL (0 mLs Intravenous Stopped 04/23/19 2129)  pantoprazole (PROTONIX) 80 mg in sodium chloride 0.9 % 100 mL IVPB (0 mg Intravenous Stopped 04/23/19 2311)  ondansetron (ZOFRAN) injection 4 mg (4 mg Intravenous Given 04/23/19 2028)    Mobility walks

## 2019-04-24 NOTE — H&P (View-Only) (Signed)
Patient ID: David Lane, male   DOB: 07/12/1946, 73 y.o.   MRN: [id number]    Brief GI note ;   CT abd/pelvis scheduled for this afternoon- hx of chronic pancreatitis and duodenal  , on EGD today  R/O malignancy   EGD today - residual food in stomach - bleeding site not identified, but no active bleeding . Pt is  Scheduled for  repeat EGD in am with Dr Rush Landmark  Hgb 7.4  This am - will repeat  this afternoon - anticipate will need transfused.

## 2019-04-24 NOTE — Op Note (Signed)
[facility] Patient Name: David Lane Procedure Date: 04/24/2019 MRN: [id number] Attending MD: Gerrit Heck , MD Date of Birth: [birth year] CSN: [id number] Age: 73 Admit Type: Inpatient Procedure:                Upper GI endoscopy Indications:              Epigastric abdominal pain, Acute post hemorrhagic                            anemia, Coffee-ground emesis, Heme positive stool,                            Melena                           73 yo male with a history of CAD (on Brilinta and                            ASA 81 mg), HTN, DM, chronic                            pancreatitis/pancreatic insufficency, presenting                            with dyspepsia, melena, coffee ground emesis, and                            symptomatic normocytic anemia with FOBT+ stools and                            elevated BUN. Providers:                Gerrit Heck, MD, Truddie Coco, RN, Glori Bickers, RN Referring MD:              Medicines:                Monitored Anesthesia Care Complications:            No immediate complications. Estimated Blood Loss:     Estimated blood loss: none. Procedure:                Pre-Anesthesia Assessment:                           - Prior to the procedure, a History and Physical                            was performed, and patient medications and                            allergies were reviewed. The patient's tolerance of                            previous anesthesia was also reviewed. The risks                            and benefits of the procedure and the  sedation                            options and risks were discussed with the patient.                            All questions were answered, and informed consent                            was obtained. Prior Anticoagulants: The patient has                            taken antiplatelet medication, last dose was 2 days                            prior to procedure. ASA Grade  Assessment: III - A                            patient with severe systemic disease. After                            reviewing the risks and benefits, the patient was                            deemed in satisfactory condition to undergo the                            procedure.                           After obtaining informed consent, the endoscope was                            passed under direct vision. Throughout the                            procedure, the patient's blood pressure, pulse, and                            oxygen saturations were monitored continuously. The                            GIF-H190 ([id number] Olympus gastroscope was                            introduced through the mouth, and advanced to the                            duodenal bulb. The upper GI endoscopy was                            accomplished without difficulty. The patient                            tolerated  the procedure well. Scope In: 9:04:01 AM Scope Out: 9:16:20 AM Total Procedure Duration: 0 hours 12 minutes 19 seconds  Findings:      The upper third of the esophagus and middle third of the esophagus were       normal.      LA Grade A (one or more mucosal breaks less than 5 mm, not extending       between tops of 2 mucosal folds) esophagitis with no bleeding was found       42 cm from the incisors.      A medium amount of food (residue) and dark contents (?hematin) was found       in the gastric fundus and in the gastric body. This limited       visualization of the proximal stomach.      The visualized mucosa was otherwise normal appearing without any noted       ulcers, erosions, or inflammation. No active bleeding nor stigmata of       bleeding noted.      The pylorus was normal and easily traversed.      A moderate luminal deformity was found in the first portion of the       duodenum which precluded endoscope passage into the second portion of       the duodenum. There was also  significant looping of the endoscope in the       stomach, which contributed to the inability to advance the scope, which       was not improved with external abdominal pressure. The visualized mucosa       in the 1st and 2nd portion of the duodenum was otherwise normal       appearing, without any ulcers or bleeding, albeit somewhat limited due       to food residue in the same area.      Localized mild inflammation characterized by congestion (edema) was       found in the duodenal bulb. This was benign appearing. Biopsies were not       obtained so to not confound potential rebleeding along with plan for       cross sectional imaging to evaluate this area in further detail. Impression:               - Normal upper third of esophagus and middle third                            of esophagus.                           - LA Grade A esophagitis.                           - A medium amount of food (residue) in the stomach                            and duodenum.                           - The visualized mucosa was otherwise normal                            appearing in the stomach,  without any acitve                            bleeding or high grade stigmata noted.                           - Normal pylorus.                           - Duodenal deformity.                           - Mild, benign appearing inflammatory changes in                            the duodenal bulb. Held off on biopsies as above.                           - No specimens collected. Moderate Sedation:      Not Applicable - Patient had care per Anesthesia. Recommendation:           - Return patient to hospital ward for ongoing care.                           - Clear liquid diet today.                           - Continue present medications.                           - Perform CT scan (computed tomography) of the                            abdomen with contrast at the next available                             appointment.                           - Continue high dose PPI for now.                           - Continue serial Hgb/Hct trend with pRBC                            transfusion per protocol.                           - If CT unrevealing, and rebleeding occurs,                            recommend reglan or erythromycin for gastric                            clearance and repeat EGD. Procedure Code(s):        --- Professional ---  51700, Esophagogastroduodenoscopy, flexible,                            transoral; diagnostic, including collection of                            specimen(s) by brushing or washing, when performed                            (separate procedure) Diagnosis Code(s):        --- Professional ---                           K20.9, Esophagitis, unspecified                           K31.89, Other diseases of stomach and duodenum                           K29.80, Duodenitis without bleeding                           R10.13, Epigastric pain                           D62, Acute posthemorrhagic anemia                           K92.0, Hematemesis                           R19.5, Other fecal abnormalities                           K92.1, Melena (includes Hematochezia) CPT copyright 2019 [employer]. All rights reserved. The codes documented in this report are preliminary and upon coder review may  be revised to meet current compliance requirements. Gerrit Heck, MD 04/24/2019 9:35:52 AM Number of Addenda: 0

## 2019-04-24 NOTE — H&P (View-Only) (Signed)
Consultation  Referring Provider:     [name], MD Primary Care Physician:  Luetta Nutting, DO Primary Gastroenterologist:      Dr. Silverio Decamp Reason for Consultation:     Melena, hematemesis, acute blood loss anemia         HPI:   David Lane is a 73 y.o. male with a history of CAD (DES placed 11/2017, on dual antiplatelet therapy), CHF, pancreatic insufficiency/chronic pancreatitis, diabetes, presenting with melena, hematemesis, and symptomatic normocytic anemia.  Symptoms were acute in onset and associated with nausea, MEG pain, and lightheadedness.  Attempted to treat at home with Pepto-Bismol.  Also noted elevated blood sugar >400 at home.  No prior history of upper GI bleed.  Had an EGD in 2017 that was normal.  Prior to that, EGD in 2012 noted questionable esophageal varices, but otherwise normal.  No prior history of liver disease/hepatic synthetic dysfunction.  Otherwise denies NSAIDs (aside from ASA 81 mg/day).  Admission hemoglobin 10.3 from 12.7 in 09/2018.  BUN 56 (baseline 20s).  Mildly elevated AST/ALT (50/91) which are downtrending today (35/76.  COVID negative.  INR 1.3.  Was admitted, treated with IV PPI, kept n.p.o., and held antiplatelet therapy.  Repeat hemoglobin 8.4 this morning with an uptrending BUN at 75 (creatinine also slightly up trended to 1.4).  Otherwise remained hemodynamically stable.  Past Medical History:  Diagnosis Date  . CAD (coronary artery disease)    last cath by [facility] DR.  Mihai Croitoru showing  some  disease involving LCX and small size of Diag   . CHF exacerbation, due to diastolic dysfunction 57/90/3833  . Chronic systolic CHF (congestive heart failure), NYHA class 1 ([place]) 07/17/2012  . GERD (gastroesophageal reflux disease)   . Hepatic lesion 02/04/11  . Hyperlipemia   . Hypertension   . Ischemic cardiomyopathy    EF 35-40%  . Liver hemangioma   . NSTEMI (non-ST elevated myocardial infarction) ([place]) 11/21/2009  . Pancreatitis 2000's   . Shortness of breath   . Type II diabetes mellitus ([place])     Past Surgical History:  Procedure Laterality Date  . AMPUTATION Left 01/14/2018   Procedure: TRANSMETATARSAL AMPUTATION;  Surgeon: Newt Minion, MD;  Location: [place];  Service: Orthopedics;  Laterality: Left;  . CARDIAC CATHETERIZATION  2011   minimal disease, medical management  . CORONARY ANGIOPLASTY WITH STENT PLACEMENT  07/14/2012   successful PCI & stenting of mid LAD & PDA off the dominant CX  . CORONARY/GRAFT ACUTE MI REVASCULARIZATION N/A 12/20/2017   Procedure: Coronary/Graft Acute MI Revascularization;  Surgeon: Martinique, Peter M, MD;  Location: [place] [facility];  Service: Cardiovascular;  Laterality: N/A;  . INCISION AND DRAINAGE ABSCESS ANAL  1970's  . LEFT HEART CATH AND CORONARY ANGIOGRAPHY N/A 12/20/2017   Procedure: LEFT HEART CATH AND CORONARY ANGIOGRAPHY;  Surgeon: Martinique, Peter M, MD;  Location: [facility];  Service: Cardiovascular;  Laterality: N/A;  . LEFT HEART CATHETERIZATION WITH CORONARY ANGIOGRAM N/A 07/14/2012   Procedure: LEFT HEART CATHETERIZATION WITH CORONARY ANGIOGRAM;  Surgeon: [name], MD;  Location: [place] [facility];  Service: Cardiovascular;  Laterality: N/A;  . PERCUTANEOUS CORONARY STENT INTERVENTION (PCI-S) Right 07/14/2012   Procedure: PERCUTANEOUS CORONARY STENT INTERVENTION (PCI-S);  Surgeon: [name], MD;  Location: [facility];  Service: Cardiovascular;  Laterality: Right;    Family History  Problem Relation Age of Onset  . Diabetes Mother   . Diabetes Paternal Grandmother   . Diabetes Maternal Grandfather   .  Colon cancer Neg Hx      Social History   Tobacco Use  . Smoking status: Never Smoker  . Smokeless tobacco: Never Used  Substance Use Topics  . Alcohol use: No  . Drug use: No    Prior to Admission medications   Medication Sig Start Date End Date Taking? Authorizing Provider  aspirin 81 MG tablet Take 81 mg by mouth daily.     Yes [provider]  carvedilol (COREG) 6.25 MG tablet Take 6.25 mg by mouth 2 (two) times daily with a meal.   Yes [provider]  escitalopram (LEXAPRO) 5 MG tablet Take 1 tablet (5 mg total) by mouth daily. 10/06/18  Yes Luetta Nutting, DO  insulin aspart (NOVOLOG FLEXPEN) 100 UNIT/ML FlexPen Inject 2 Units into the skin 3 (three) times daily with meals. Take only if your blood sugar is over 400. Patient taking differently: Inject 5-7 Units into the skin 3 (three) times daily as needed for high blood sugar. Take only if your blood sugar is over 400. 03/29/19  Yes Renato Shin, MD  LANTUS SOLOSTAR 100 UNIT/ML Solostar Pen INJECT [place] MORNING Patient taking differently: Inject 65 Units into the skin daily.  04/06/19  Yes Renato Shin, MD  Multiple Vitamins-Minerals (CENTRUM SILVER ULTRA MENS PO) Take 1 tablet by mouth daily.    Yes [provider]  Pancrelipase, Lip-Prot-Amyl, (CREON) 24000-76000 units CPEP TAKE 2 CAPSULES WITH EACH MEAL AND 1 CAPSULE WITH SMALL SNACKS (10 CAPS PER DAY) Patient taking differently: Take 2 capsules by mouth 3 (three) times daily as needed (digestion).  10/06/18  Yes Luetta Nutting, DO  rosuvastatin (CRESTOR) 40 MG tablet TAKE 1 TABLET(40 MG) BY MOUTH AT BEDTIME Patient taking differently: Take 40 mg by mouth every evening.  02/23/19  Yes Croitoru, Mihai, MD  ticagrelor (BRILINTA) 90 MG TABS tablet Take 1 tablet (90 mg total) by mouth 2 (two) times daily. 03/15/19  Yes Croitoru, Mihai, MD  glucose blood (ONE TOUCH ULTRA TEST) test strip USE 1 STRIP TO CHECK GLUCOSE 4 TIMES DAILY; K86.89, E10.8 09/25/18   Renato Shin, MD  Insulin Pen Needle (B-D UF III MINI PEN NEEDLES) 31G X 5 MM MISC use to inject insulin daily 03/15/19   Renato Shin, MD  pantoprazole (PROTONIX) 40 MG tablet Take 1 tablet (40 mg total) by mouth daily. Patient not taking: Reported on 04/23/2019 01/27/18   Bary Leriche, PA-C    Current Facility-Administered  Medications  Medication Dose Route Frequency Provider Last Rate Last Dose  . 0.45 % sodium chloride infusion   Intravenous Continuous Opyd, Ilene Qua, MD 65 mL/hr at 04/24/19 0747    . acetaminophen (TYLENOL) tablet 650 mg  650 mg Oral Q6H PRN Opyd, Ilene Qua, MD       Or  . acetaminophen (TYLENOL) suppository 650 mg  650 mg Rectal Q6H PRN Opyd, Ilene Qua, MD      . Chlorhexidine Gluconate Cloth 2 % PADS 6 each  6 each Topical Daily Opyd, Ilene Qua, MD      . insulin aspart (novoLOG) injection 0-9 Units  0-9 Units Subcutaneous Q4H Opyd, Ilene Qua, MD   7 Units at 04/24/19 0754  . insulin glargine (LANTUS) injection 30 Units  30 Units Subcutaneous Daily Opyd, Ilene Qua, MD      . LORazepam (ATIVAN) tablet 0.5 mg  0.5 mg Oral Q6H PRN Opyd, Ilene Qua, MD   0.5 mg at 04/23/19 2341  . ondansetron (  ZOFRAN) tablet 4 mg  4 mg Oral Q6H PRN Opyd, Ilene Qua, MD       Or  . ondansetron (ZOFRAN) injection 4 mg  4 mg Intravenous Q6H PRN Opyd, Ilene Qua, MD      . pantoprazole (PROTONIX) 80 mg in sodium chloride 0.9 % 250 mL (0.32 mg/mL) infusion  8 mg/hr Intravenous Continuous Opyd, Ilene Qua, MD 25 mL/hr at 04/24/19 0747 8 mg/hr at 04/24/19 0747  . promethazine (PHENERGAN) injection 12.5 mg  12.5 mg Intravenous Q6H PRN Opyd, Ilene Qua, MD   12.5 mg at 04/23/19 2342  . sodium chloride flush (NS) 0.9 % injection 3 mL  3 mL Intravenous Q12H Opyd, Ilene Qua, MD   3 mL at 04/24/19 0231    Allergies as of 04/23/2019 - Review Complete 04/23/2019  Allergen Reaction Noted  . Lipitor [atorvastatin] Other (See Comments) 07/17/2012  . Metformin and related Diarrhea 01/12/2018     Review of Systems:    As per HPI, otherwise negative    Physical Exam:  Vital signs in last 24 hours: Temp:  [97.8 F (36.6 C)-100 F (37.8 C)] 97.8 F (36.6 C) (07/28 0700) Pulse Rate:  [97-113] 104 (07/28 0800) Resp:  [13-24] 24 (07/28 0800) BP: (81-129)/(59-74) 129/65 (07/28 0800) SpO2:  [94 %-100 %] 98 % (07/28  0800) Weight:  [82.6 kg] 82.6 kg (07/28 0157) Last BM Date: 04/24/19 General:   Pleasant male in NAD Head:  Normocephalic and atraumatic. Eyes:   No icterus.   Conjunctiva pink. Ears:  Normal auditory acuity. Neck:  Supple Lungs:  Respirations even and unlabored. Lungs clear to auscultation bilaterally.   No wheezes, crackles, or rhonchi.  Heart:  Regular rate and rhythm; no MRG Abdomen:  Soft, nondistended, nontender. Normal bowel sounds. No appreciable masses or hepatomegaly.  Rectal:  Not performed.  Msk:  Symmetrical without gross deformities.  Extremities:  Without edema. Neurologic:  Alert and  oriented x4;  grossly normal neurologically. Skin:  Intact without significant lesions or rashes. Psych:  Alert and cooperative. Normal affect.  LAB RESULTS: Recent Labs    04/23/19 1829 04/24/19 0203  WBC 10.4  --   HGB 10.3* 8.4*  HCT 31.2* 24.7*  PLT 215  --    BMET Recent Labs    04/23/19 1829 04/24/19 0203  NA 135 134*  K 4.8 4.6  CL 101 101  CO2 22 22  GLUCOSE 432* 481*  BUN 56* 75*  CREATININE 1.18 1.39*  CALCIUM 8.2* 8.0*   LFT Recent Labs    04/23/19 2010 04/24/19 0203  PROT 6.2* 5.8*  ALBUMIN 3.3* 3.1*  AST 50* 35  ALT 91* 76*  ALKPHOS 61 54  BILITOT 0.9 1.2  BILIDIR 0.1  --   IBILI 0.8  --    PT/INR Recent Labs    04/23/19 2010  LABPROT 15.9*  INR 1.3*    STUDIES: Dg Chest Port 1 View  Result Date: 04/23/2019 CLINICAL DATA:  Weakness, GI bleed EXAM: PORTABLE CHEST 1 VIEW COMPARISON:  01/12/2018 FINDINGS: The heart size and mediastinal contours are within normal limits. Left coronary stent. Both lungs are clear. The visualized skeletal structures are unremarkable. IMPRESSION: No active disease. Electronically Signed   By: Franchot Gallo M.D.   On: 04/23/2019 20:47     PREVIOUS ENDOSCOPIES:            -EGD (2017, Dr. Silverio Decamp): Normal -EGD (2012, Dr. Watt Climes): Questionable esophageal varices, otherwise reportedly normal -Colonoscopy (2012,  Dr. Watt Climes): Normal  Impression / Plan:   1) Acute blood loss anemia/normocytic anemia 2) Melena 3) Hematemesis 4) Epigastric pain 73 year old male with multiple medical problems to include CAD with DES placed in 11/2017, dual antiplatelet therapy, CHF, hypertension, pancreatic insufficiency/chronic pancreatitis, diabetes, presenting with suspected acute onset upper GI bleed with associated symptomatic anemia.  - Expedited upper endoscopy this morning for diagnostic and therapeutic intent - Held antiplatelet therapy overnight.  Given degree and symptomatic nature of anemia, will elect for urgent endoscopy this morning instead of waiting for full washout of the Brilinta ( T1/2 ~56 hours) - Continue IV PPI for now -Continue n.p.o. status - Continue serial H/H checks with blood transfusion per protocol  5) Elevated AST/ALT: Mildly elevated liver enzymes, which are downtrending this morning.  Previously normal in 12/2017.  - Continue to trend.  No further work-up at this juncture  The indications, risks, and benefits of EGD were explained to the patient in detail. Risks include but are not limited to bleeding, perforation, adverse reaction to medications, and cardiopulmonary compromise. Sequelae include but are not limited to the possibility of surgery, hositalization, and mortality. The patient verbalized understanding and wished to proceed. All questions answered. Further recommendations pending results of the exam.    Gerrit Heck, DO, [facility]    LOS: 0 days   Lavena Bullion  04/24/2019, 8:27 AM

## 2019-04-24 NOTE — Transfer of Care (Signed)
Immediate Anesthesia Transfer of Care Note  Patient: David Lane  Procedure(s) Performed: ESOPHAGOGASTRODUODENOSCOPY (EGD) (Left )  Patient Location: PACU  Anesthesia Type:MAC  Level of Consciousness: awake, alert  and oriented  Airway & Oxygen Therapy: Patient Spontanous Breathing and Patient connected to nasal cannula oxygen  Post-op Assessment: Report given to RN and Post -op Vital signs reviewed and stable  Post vital signs: Reviewed and stable  Last Vitals:  Vitals Value Taken Time  BP    Temp    Pulse    Resp    SpO2      Last Pain:  Vitals:   04/24/19 0841  TempSrc: Oral  PainSc: 0-No pain         Complications: No apparent anesthesia complications

## 2019-04-24 NOTE — Progress Notes (Addendum)
PROGRESS NOTE    David Lane  GGE:[id number] DOB: 05-Feb-1946 DOA: 04/23/2019 PCP: Luetta Nutting, DO   Brief Narrative:  Per [facility] [name] is a 73 y.o. male with medical history significant for coronary artery disease, chronic combined systolic and diastolic CHF, pancreatic insufficiency, insulin-dependent diabetes mellitus, and hypertension, now presenting to the emergency department for evaluation of melena, dark vomitus, lightheadedness, and elevated blood sugars.  Patient reports that he was in his usual state of health yesterday until the evening when he developed a mild nausea.  He went on to experience burning discomfort in the epigastrium that did not respond to multiple doses of Pepto-Bismol, noted dark stools that he was attributing to the Pepto-Bismol, went on to vomit some dark material, and has been lightheaded.  Patient also reports blood sugars in the 400 range despite continued adherence with his insulin at home.  Patient denies any use of NSAIDs.  He has been lightheaded upon standing today, describing near syncope, but has not lost consciousness.  He was previously taking a daily PPI, but not in a few months.  He takes aspirin 81 mg and Brilinta daily.  Most recent stent was drug-eluting and placed in March 2019.  He has not experienced any fevers, chills, shortness of breath, chest pain, cough, or swelling recently.  Patient called EMS this evening, reports that the paramedics noted orthostatic hypotension, and he was brought into the ED.  ED Course: Upon arrival to the ED, patient is found to be afebrile, saturating well on room air, heart rate 99, and systolic blood pressure of 101.  EKG features a sinus rhythm with left axis deviation.  Chest x-ray is negative for acute cardiopulmonary disease.  Chemistry panel is notable for a BUN of 56, creatinine 1.18, AST 50, ALT 91, and glucose 432.  CBC is notable for hemoglobin of 10.3, down from 12.7 and January.  Fecal occult  blood testing is positive.  Gastroenterology was consulted by the ED physician, patient was started on IV Protonix, and a medical admission was recommended.    Assessment & Plan:   Principal Problem:   Acute upper GI bleed Active Problems:   Essential hypertension   CAD, 07/14/12- LAD/PDA DES   Ischemic cardiomyopathy, EF 50% by echo 08/01/13   Pancreatic insufficiency   Type 1 diabetes mellitus with complications ([place])   Duodenitis   Gastroesophageal reflux disease with esophagitis   1. Upper GI bleeding; anemia - Presented with ~24 hrs of burning epigastric discomfort, melena, and dark vomitus  - SBP 100 and HR 100 in ED, given a liter of NS  - Initial Hgb was 10.3 in ER, down from 12.7 in January 2020 -TRENDED DOWN 8.4>7.4. hgb currently pending - Started on IV PPI in ED and GI was consulted with EGD today that was limited study 2/2 residual food, no source of bleeding noted although repeat EGD for wed AM - Continue NPO, IV Protonix, type and screen, trendING H&H, hold antiplatelets initially    1639: ADDENDUM: Pts repeat hgb 7.1, continued downtrend, will transfuse 2U prbc  2. Insulin-dependent DM  - A1c was 9.1% in July 2020  - Continue Lantus with Novolog correctional    3. CAD  - No anginal complaints  - Antiplatelets held on admission in setting of acute GIB    4. Chronic combined systolic & diastolic CHF   - Appears compensated  - Given a liter NS bolus in ED in setting of acute GIB with orthostasis  -  Follow daily wt and I/O's    DVT prophylaxis: SCD/Compression stockings  Code Status: Full    Code Status Orders  (From admission, onward)         Start     Ordered   04/24/19 0041  Full code  Continuous     04/24/19 0040        Code Status History    Date Active Date Inactive Code Status Order ID Comments User Context   01/20/2018 1815 01/28/2018 1341 Full Code [id number]  Flora Lipps Inpatient   01/20/2018 1815 01/20/2018 1815 Full Code  [id number]  Flora Lipps Inpatient   01/13/2018 0031 01/20/2018 1802 Full Code [id number]  Karmen Bongo, MD Inpatient   12/20/2017 1729 12/25/2017 1616 Full Code [id number]  [place], Peter M, MD Inpatient   01/09/2014 1715 01/15/2014 1527 Full Code [id number]  Elmarie Shiley, MD Inpatient   07/17/2012 1906 07/19/2012 1925 Full Code [id number]  Cecilie Kicks, NP ED   07/14/2012 0519 07/16/2012 2127 Full Code [id number]  Wendall Mola, RN Inpatient   Advance Care Planning Activity     Family Communication: tried calling sister Kalman Shan, [place] unable to leave VM -asked for mailbox number Disposition Plan:   Patient will remain inpatient for close monitoring of hemoglobin, repeat EGD, with subspecialty evaluation, possible transfusion.  That these treatments and labs patient at risk of severe life-threatening clinical deterioration. Consults called: None Admission status: Inpatient   Consultants:   GI  Procedures:  Ct Abdomen Pelvis W Contrast  Result Date: 04/24/2019 CLINICAL DATA:  Nausea, vomiting with loose dark stools and coffee ground emesis. Prior history of pancreatitis. EXAM: CT ABDOMEN AND PELVIS WITH CONTRAST TECHNIQUE: Multidetector CT imaging of the abdomen and pelvis was performed using the standard protocol following bolus administration of intravenous contrast. CONTRAST:  126mL OMNIPAQUE IOHEXOL 300 MG/ML  SOLN COMPARISON:  MRI abdomen from 2012 FINDINGS: Lower chest: The lung bases are clear of acute process. No pleural effusion or pulmonary lesions. The heart is normal in size. No pericardial effusion. The distal esophagus and aorta are unremarkable. Hepatobiliary: 3.5 cm lobulated segment 8 liver lesion consistent with known hepatic hemangioma. Typical nodular peripheral enhancement and unchanged since 2012. Pancreas: Marked pancreatic atrophy and diffuse calcifications consistent with chronic calcific pancreatitis. No findings for acute inflammation and no mass. No worrisome  hepatic lesions or intrahepatic biliary dilatation. The gallbladder is unremarkable. No common bile duct dilatation. Spleen: Normal size.  No focal lesions. Adrenals/Urinary Tract: The adrenal glands and kidneys are unremarkable. No renal masses or hydronephrosis. The bladder is unremarkable. Stomach/Bowel: The stomach, duodenum, small bowel and colon are unremarkable. No acute inflammatory changes, mass lesions or obstructive findings. Vascular/Lymphatic: The aorta and branch vessels are patent. Scattered atherosclerotic calcifications. The major venous structures are patent. No mesenteric or retroperitoneal mass or adenopathy. Reproductive: Mild prostate gland enlargement. Other: No pelvic mass or adenopathy. No free pelvic fluid collections. No inguinal mass or adenopathy. No abdominal wall hernia or subcutaneous lesions. Musculoskeletal: No significant bony findings. IMPRESSION: 1. No acute abdominal/pelvic findings, mass lesions or adenopathy. 2. Stable right hepatic lobe hemangioma. 3. Changes of chronic calcific pancreatitis but no definite findings for acute inflammation. 4. Three-vessel coronary artery calcifications. Electronically Signed   By: Marijo Sanes M.D.   On: 04/24/2019 15:15   Dg Chest Port 1 View  Result Date: 04/23/2019 CLINICAL DATA:  Weakness, GI bleed EXAM: PORTABLE CHEST 1 VIEW COMPARISON:  01/12/2018 FINDINGS: The heart size and mediastinal contours are  within normal limits. Left coronary stent. Both lungs are clear. The visualized skeletal structures are unremarkable. IMPRESSION: No active disease. Electronically Signed   By: Franchot Gallo M.D.   On: 04/23/2019 20:47     Antimicrobials:   None   Subjective: No acute events overnight.  Patient was admitted secondary to GI bleed. Underwent EGD with no obvious source found although limited study secondary to food still retained in stomach CT this afternoon negative for identified malignancy Scheduled for repeat EGD in the  morning  Objective: Vitals:   04/24/19 0940 04/24/19 1200 04/24/19 1325 04/24/19 1400  BP: 124/60  122/78 (!) 122/58  Pulse: 93  83 86  Resp: 16  (!) 25 18  Temp:  98.2 F (36.8 C)    TempSrc:  Oral    SpO2: 99%  94% 100%  Weight:      Height:        Intake/Output Summary (Last 24 hours) at 04/24/2019 1628 Last data filed at 04/24/2019 1600 Gross per 24 hour  Intake 2041.29 ml  Output 800 ml  Net 1241.29 ml   Filed Weights   04/24/19 0157 04/24/19 0841  Weight: 82.6 kg 82 kg    Examination:  General exam: Appears calm and comfortable, hyperverbal Respiratory system: Clear to auscultation. Respiratory effort normal. Cardiovascular system: S1 & S2 heard, RRR. No JVD, murmurs, rubs, gallops or clicks. No pedal edema. Gastrointestinal system: Abdomen is nondistended, soft and nontender. No organomegaly or masses felt. Normal bowel sounds heard. Central nervous system: Alert and oriented. No focal neurological deficits. Extremities: Symmetric 5 x 5 strength Skin: No rashes, lesions or ulcers Psychiatry: Judgement and insight appear normal. Mood & affect appropriate.     Data Reviewed: I have personally reviewed following labs and imaging studies  CBC: Recent Labs  Lab 04/23/19 1829 04/24/19 0203 04/24/19 1018 04/24/19 1606  WBC 10.4  --   --   --   HGB 10.3* 8.4* 7.4* 7.1*  HCT 31.2* 24.7* 22.2* 20.9*  MCV 95.7  --   --   --   PLT 215  --   --   --    Basic Metabolic Panel: Recent Labs  Lab 04/23/19 1829 04/24/19 0203  NA 135 134*  K 4.8 4.6  CL 101 101  CO2 22 22  GLUCOSE 432* 481*  BUN 56* 75*  CREATININE 1.18 1.39*  CALCIUM 8.2* 8.0*   GFR: Estimated Creatinine Clearance: 54.3 mL/min (A) (by C-G formula based on SCr of 1.39 mg/dL (H)). Liver Function Tests: Recent Labs  Lab 04/23/19 2010 04/24/19 0203  AST 50* 35  ALT 91* 76*  ALKPHOS 61 54  BILITOT 0.9 1.2  PROT 6.2* 5.8*  ALBUMIN 3.3* 3.1*   No results for input(s): LIPASE, AMYLASE in  the last 168 hours. No results for input(s): AMMONIA in the last 168 hours. Coagulation Profile: Recent Labs  Lab 04/23/19 2010  INR 1.3*   Cardiac Enzymes: No results for input(s): CKTOTAL, CKMB, CKMBINDEX, TROPONINI in the last 168 hours. BNP (last 3 results) No results for input(s): PROBNP in the last 8760 hours. HbA1C: No results for input(s): HGBA1C in the last 72 hours. CBG: Recent Labs  Lab 04/24/19 0419 04/24/19 0745 04/24/19 0840 04/24/19 1121 04/24/19 1516  GLUCAP 470* 334* 298* 213* 183*   Lipid Profile: No results for input(s): CHOL, HDL, LDLCALC, TRIG, CHOLHDL, LDLDIRECT in the last 72 hours. Thyroid Function Tests: No results for input(s): TSH, T4TOTAL, FREET4, T3FREE, THYROIDAB in the last 72  hours. Anemia Panel: No results for input(s): VITAMINB12, FOLATE, FERRITIN, TIBC, IRON, RETICCTPCT in the last 72 hours. Sepsis Labs: No results for input(s): PROCALCITON, LATICACIDVEN in the last 168 hours.  Recent Results (from the past 240 hour(s))  SARS Coronavirus 2 (CEPHEID - Performed in [place] hospital lab), Hosp Order     Status: None   Collection Time: 04/23/19  8:10 PM   Specimen: Nasopharyngeal Swab  Result Value Ref Range Status   SARS Coronavirus 2 NEGATIVE NEGATIVE Final    Comment: (NOTE) If result is NEGATIVE SARS-CoV-2 target nucleic acids are NOT DETECTED. The SARS-CoV-2 RNA is generally detectable in upper and lower  respiratory specimens during the acute phase of infection. The lowest  concentration of SARS-CoV-2 viral copies this assay can detect is 250  copies / mL. A negative result does not preclude SARS-CoV-2 infection  and should not be used as the sole basis for treatment or other  patient management decisions.  A negative result may occur with  improper specimen collection / handling, submission of specimen other  than nasopharyngeal swab, presence of viral mutation(s) within the  areas targeted by this assay, and inadequate  number of viral copies  (<250 copies / mL). A negative result must be combined with clinical  observations, patient history, and epidemiological information. If result is POSITIVE SARS-CoV-2 target nucleic acids are DETECTED. The SARS-CoV-2 RNA is generally detectable in upper and lower  respiratory specimens dur ing the acute phase of infection.  Positive  results are indicative of active infection with SARS-CoV-2.  Clinical  correlation with patient history and other diagnostic information is  necessary to determine patient infection status.  Positive results do  not rule out bacterial infection or co-infection with other viruses. If result is PRESUMPTIVE POSTIVE SARS-CoV-2 nucleic acids MAY BE PRESENT.   A presumptive positive result was obtained on the submitted specimen  and confirmed on repeat testing.  While 2019 novel coronavirus  (SARS-CoV-2) nucleic acids may be present in the submitted sample  additional confirmatory testing may be necessary for epidemiological  and / or clinical management purposes  to differentiate between  SARS-CoV-2 and other Sarbecovirus currently known to infect humans.  If clinically indicated additional testing with an alternate test  methodology [phone]) is advised. The SARS-CoV-2 RNA is generally  detectable in upper and lower respiratory sp ecimens during the acute  phase of infection. The expected result is Negative. Fact Sheet for Patients:  StrictlyIdeas.no Fact Sheet for Healthcare Providers: BankingDealers.co.za This test is not yet approved or cleared by the [place] FDA and has been authorized for detection and/or diagnosis of SARS-CoV-2 by FDA under an Emergency Use Authorization (EUA).  This EUA will remain in effect (meaning this test can be used) for the duration of the COVID-19 declaration under Section 564(b)(1) of the Act, 21 U.S.C. section 360bbb-3(b)(1), unless the  authorization is terminated or revoked sooner. Performed at [facility], [place] [address] [place], [place] [address]   MRSA PCR Screening     Status: None   Collection Time: 04/24/19  1:45 AM   Specimen: Nasal Mucosa; Nasopharyngeal  Result Value Ref Range Status   MRSA by PCR NEGATIVE NEGATIVE Final    Comment:        The GeneXpert MRSA Assay (FDA approved for NASAL specimens only), is one component of a comprehensive MRSA colonization surveillance program. It is not intended to diagnose MRSA infection nor to guide or monitor treatment for MRSA infections. Performed at  [facility], [place] [address], [place], [place] [address]          Radiology Studies: Ct Abdomen Pelvis W Contrast  Result Date: 04/24/2019 CLINICAL DATA:  Nausea, vomiting with loose dark stools and coffee ground emesis. Prior history of pancreatitis. EXAM: CT ABDOMEN AND PELVIS WITH CONTRAST TECHNIQUE: Multidetector CT imaging of the abdomen and pelvis was performed using the standard protocol following bolus administration of intravenous contrast. CONTRAST:  178mL OMNIPAQUE IOHEXOL 300 MG/ML  SOLN COMPARISON:  MRI abdomen from 2012 FINDINGS: Lower chest: The lung bases are clear of acute process. No pleural effusion or pulmonary lesions. The heart is normal in size. No pericardial effusion. The distal esophagus and aorta are unremarkable. Hepatobiliary: 3.5 cm lobulated segment 8 liver lesion consistent with known hepatic hemangioma. Typical nodular peripheral enhancement and unchanged since 2012. Pancreas: Marked pancreatic atrophy and diffuse calcifications consistent with chronic calcific pancreatitis. No findings for acute inflammation and no mass. No worrisome hepatic lesions or intrahepatic biliary dilatation. The gallbladder is unremarkable. No common bile duct dilatation. Spleen: Normal size.  No focal lesions. Adrenals/Urinary Tract: The adrenal glands and kidneys are  unremarkable. No renal masses or hydronephrosis. The bladder is unremarkable. Stomach/Bowel: The stomach, duodenum, small bowel and colon are unremarkable. No acute inflammatory changes, mass lesions or obstructive findings. Vascular/Lymphatic: The aorta and branch vessels are patent. Scattered atherosclerotic calcifications. The major venous structures are patent. No mesenteric or retroperitoneal mass or adenopathy. Reproductive: Mild prostate gland enlargement. Other: No pelvic mass or adenopathy. No free pelvic fluid collections. No inguinal mass or adenopathy. No abdominal wall hernia or subcutaneous lesions. Musculoskeletal: No significant bony findings. IMPRESSION: 1. No acute abdominal/pelvic findings, mass lesions or adenopathy. 2. Stable right hepatic lobe hemangioma. 3. Changes of chronic calcific pancreatitis but no definite findings for acute inflammation. 4. Three-vessel coronary artery calcifications. Electronically Signed   By: Marijo Sanes M.D.   On: 04/24/2019 15:15   Dg Chest Port 1 View  Result Date: 04/23/2019 CLINICAL DATA:  Weakness, GI bleed EXAM: PORTABLE CHEST 1 VIEW COMPARISON:  01/12/2018 FINDINGS: The heart size and mediastinal contours are within normal limits. Left coronary stent. Both lungs are clear. The visualized skeletal structures are unremarkable. IMPRESSION: No active disease. Electronically Signed   By: Franchot Gallo M.D.   On: 04/23/2019 20:47        Scheduled Meds: . sodium chloride   Intravenous Once  . Chlorhexidine Gluconate Cloth  6 each Topical Daily  . insulin aspart  0-9 Units Subcutaneous Q4H  . insulin glargine  30 Units Subcutaneous Daily  . sodium chloride (PF)      . sodium chloride flush  3 mL Intravenous Q12H   Continuous Infusions: . pantoprozole (PROTONIX) infusion 8 mg/hr (04/24/19 1000)     LOS: 0 days    Time spent: 12 min    Nicolette Bang, MD [facility]  If 7PM-7AM, please contact night-coverage   04/24/2019, 4:28 PM

## 2019-04-24 NOTE — Interval H&P Note (Signed)
History and Physical Interval Note:  04/24/2019 8:58 AM  David Lane  has presented today for surgery, with the diagnosis of Hematemesis, acute blood loss anemia.  The various methods of treatment have been discussed with the patient and family. After consideration of risks, benefits and other options for treatment, the patient has consented to  Procedure(s): ESOPHAGOGASTRODUODENOSCOPY (EGD) (Left) as a surgical intervention.  The patient's history has been reviewed, patient examined, no change in status, stable for surgery.  I have reviewed the patient's chart and labs.  Questions were answered to the patient's satisfaction.     Dominic Pea Celine Dishman

## 2019-04-24 NOTE — Progress Notes (Signed)
Report received from [place], [place]. I agree with previous RN's assessment. Patient transferred to 4th floor, VSS, patient with no complaints. Will continue to monitor. Carnella [place] I

## 2019-04-24 NOTE — Progress Notes (Signed)
Patient ID: David Lane, male   DOB: 03/12/1946, 73 y.o.   MRN: [id number]    Brief GI note ;   CT abd/pelvis scheduled for this afternoon- hx of chronic pancreatitis and duodenal  , on EGD today  R/O malignancy   EGD today - residual food in stomach - bleeding site not identified, but no active bleeding . Pt is  Scheduled for  repeat EGD in am with Dr Rush Landmark  Hgb 7.4  This am - will repeat  this afternoon - anticipate will need transfused.

## 2019-04-24 NOTE — Consult Note (Addendum)
Consultation  Referring Provider:     [name], MD Primary Care Physician:  Luetta Nutting, David Lane Primary Gastroenterologist:      Dr. Silverio Decamp Reason for Consultation:     Melena, hematemesis, acute blood loss anemia         HPI:   David Lane is a 73 y.o. male with a history of CAD (DES placed 11/2017, on dual antiplatelet therapy), CHF, pancreatic insufficiency/chronic pancreatitis, diabetes, presenting with melena, hematemesis, and symptomatic normocytic anemia.  Symptoms were acute in onset and associated with nausea, MEG burning pain/dyspepsia, and lightheadedness.  Attempted to treat at home with Pepto-Bismol.  Also noted elevated blood sugar >400 at home.  No prior history of upper GI bleed.  Had an EGD in 2017 that was normal.  Prior to that, EGD in 2012 noted questionable esophageal varices, but otherwise normal.  No prior history of liver disease/hepatic synthetic dysfunction.  Otherwise denies NSAIDs (aside from ASA 81 mg/day). Last took his Brilinta and ASA 2 days ago.   Admission hemoglobin 10.3 from 12.7 in 09/2018.  BUN 56 (baseline 20s).  Mildly elevated AST/ALT (50/91) which are downtrending today (35/76.  COVID negative.  INR 1.3.  Was admitted, treated with IV PPI, kept n.p.o., and held antiplatelet therapy.  Repeat hemoglobin 8.4 this morning with an uptrending BUN at 75 (creatinine also slightly up trended to 1.4).  Otherwise remained hemodynamically stable.  Past Medical History:  Diagnosis Date  . CAD (coronary artery disease)    last cath by [facility] DR.  Mihai Croitoru showing  some  disease involving LCX and small size of Diag   . CHF exacerbation, due to diastolic dysfunction 48/54/6270  . Chronic systolic CHF (congestive heart failure), NYHA class 1 ([place]) 07/17/2012  . GERD (gastroesophageal reflux disease)   . Hepatic lesion 02/04/11  . Hyperlipemia   . Hypertension   . Ischemic cardiomyopathy    EF 35-40%  . Liver hemangioma   . NSTEMI (non-ST elevated  myocardial infarction) ([place]) 11/21/2009  . Pancreatitis 2000's  . Shortness of breath   . Type II diabetes mellitus ([place])     Past Surgical History:  Procedure Laterality Date  . AMPUTATION Left 01/14/2018   Procedure: TRANSMETATARSAL AMPUTATION;  Surgeon: Newt Minion, MD;  Location: [place];  Service: Orthopedics;  Laterality: Left;  . CARDIAC CATHETERIZATION  2011   minimal disease, medical management  . CORONARY ANGIOPLASTY WITH STENT PLACEMENT  07/14/2012   successful PCI & stenting of mid LAD & PDA off the dominant CX  . CORONARY/GRAFT ACUTE MI REVASCULARIZATION N/A 12/20/2017   Procedure: Coronary/Graft Acute MI Revascularization;  Surgeon: Martinique, Peter M, MD;  Location: [place] CV LAB;  Service: Cardiovascular;  Laterality: N/A;  . INCISION AND DRAINAGE ABSCESS ANAL  1970's  . LEFT HEART CATH AND CORONARY ANGIOGRAPHY N/A 12/20/2017   Procedure: LEFT HEART CATH AND CORONARY ANGIOGRAPHY;  Surgeon: Martinique, Peter M, MD;  Location: [place] CV [facility];  Service: Cardiovascular;  Laterality: N/A;  . LEFT HEART CATHETERIZATION WITH CORONARY ANGIOGRAM N/A 07/14/2012   Procedure: LEFT HEART CATHETERIZATION WITH CORONARY ANGIOGRAM;  Surgeon: [name], MD;  Location: [place] CATH LAB;  Service: Cardiovascular;  Laterality: N/A;  . PERCUTANEOUS CORONARY STENT INTERVENTION (PCI-S) Right 07/14/2012   Procedure: PERCUTANEOUS CORONARY STENT INTERVENTION (PCI-S);  Surgeon: [name], MD;  Location: [facility] C[facility] LAB;  Service: Cardiovascular;  Laterality: Right;    Family History  Problem Relation Age of Onset  . Diabetes Mother   .  Diabetes Paternal Grandmother   . Diabetes Maternal Grandfather   . Colon cancer Neg Hx      Social History   Tobacco Use  . Smoking status: Never Smoker  . Smokeless tobacco: Never Used  Substance Use Topics  . Alcohol use: No  . Drug use: No    Prior to Admission medications   Medication Sig Start Date End Date Taking? Authorizing Provider  aspirin  81 MG tablet Take 81 mg by mouth daily.     Yes [provider]  carvedilol (COREG) 6.25 MG tablet Take 6.25 mg by mouth 2 (two) times daily with a meal.   Yes [provider]  escitalopram (LEXAPRO) 5 MG tablet Take 1 tablet (5 mg total) by mouth daily. 10/06/18  Yes Luetta Nutting, David Lane  insulin aspart (NOVOLOG FLEXPEN) 100 UNIT/ML FlexPen Inject 2 Units into the skin 3 (three) times daily with meals. Take only if your blood sugar is over 400. Patient taking differently: Inject 5-7 Units into the skin 3 (three) times daily as needed for high blood sugar. Take only if your blood sugar is over 400. 03/29/19  Yes Renato Shin, MD  LANTUS SOLOSTAR 100 UNIT/ML Solostar Pen INJECT [place] MORNING Patient taking differently: Inject 65 Units into the skin daily.  04/06/19  Yes Renato Shin, MD  Multiple Vitamins-Minerals (CENTRUM SILVER ULTRA MENS PO) Take 1 tablet by mouth daily.    Yes [provider]  Pancrelipase, Lip-Prot-Amyl, (CREON) 24000-76000 units CPEP TAKE 2 CAPSULES WITH EACH MEAL AND 1 CAPSULE WITH SMALL SNACKS (10 CAPS PER DAY) Patient taking differently: Take 2 capsules by mouth 3 (three) times daily as needed (digestion).  10/06/18  Yes Luetta Nutting, David Lane  rosuvastatin (CRESTOR) 40 MG tablet TAKE 1 TABLET(40 MG) BY MOUTH AT BEDTIME Patient taking differently: Take 40 mg by mouth every evening.  02/23/19  Yes Croitoru, Mihai, MD  ticagrelor (BRILINTA) 90 MG TABS tablet Take 1 tablet (90 mg total) by mouth 2 (two) times daily. 03/15/19  Yes Croitoru, Mihai, MD  glucose blood (ONE TOUCH ULTRA TEST) test strip USE 1 STRIP TO CHECK GLUCOSE 4 TIMES DAILY; K86.89, E10.8 09/25/18   Renato Shin, MD  Insulin Pen Needle (B-D UF III MINI PEN NEEDLES) 31G X 5 MM MISC use to inject insulin daily 03/15/19   Renato Shin, MD  pantoprazole (PROTONIX) 40 MG tablet Take 1 tablet (40 mg total) by mouth daily. Patient not taking: Reported on 04/23/2019 01/27/18   Bary Leriche, PA-C    Current Facility-Administered Medications  Medication Dose Route Frequency Provider Last Rate Last Dose  . 0.45 % sodium chloride infusion   Intravenous Continuous Opyd, Ilene Qua, MD 65 mL/hr at 04/24/19 0747    . acetaminophen (TYLENOL) tablet 650 mg  650 mg Oral Q6H PRN Opyd, Ilene Qua, MD       Or  . acetaminophen (TYLENOL) suppository 650 mg  650 mg Rectal Q6H PRN Opyd, Ilene Qua, MD      . Chlorhexidine Gluconate Cloth 2 % PADS 6 each  6 each Topical Daily Opyd, Ilene Qua, MD      . insulin aspart (novoLOG) injection 0-9 Units  0-9 Units Subcutaneous Q4H Opyd, Ilene Qua, MD   7 Units at 04/24/19 0754  . insulin glargine (LANTUS) injection 30 Units  30 Units Subcutaneous Daily Opyd, Ilene Qua, MD      . LORazepam (ATIVAN) tablet 0.5 mg  0.5 mg Oral Q6H PRN Opyd, Christia Reading  S, MD   0.5 mg at 04/23/19 2341  . ondansetron (ZOFRAN) tablet 4 mg  4 mg Oral Q6H PRN Opyd, Ilene Qua, MD       Or  . ondansetron (ZOFRAN) injection 4 mg  4 mg Intravenous Q6H PRN Opyd, Ilene Qua, MD      . pantoprazole (PROTONIX) 80 mg in sodium chloride 0.9 % 250 mL (0.32 mg/mL) infusion  8 mg/hr Intravenous Continuous Opyd, Ilene Qua, MD 25 mL/hr at 04/24/19 0747 8 mg/hr at 04/24/19 0747  . promethazine (PHENERGAN) injection 12.5 mg  12.5 mg Intravenous Q6H PRN Opyd, Ilene Qua, MD   12.5 mg at 04/23/19 2342  . sodium chloride flush (NS) 0.9 % injection 3 mL  3 mL Intravenous Q12H Opyd, Ilene Qua, MD   3 mL at 04/24/19 0231    Allergies as of 04/23/2019 - Review Complete 04/23/2019  Allergen Reaction Noted  . Lipitor [atorvastatin] Other (See Comments) 07/17/2012  . Metformin and related Diarrhea 01/12/2018     Review of Systems:    As per HPI, otherwise negative    Physical Exam:  Vital signs in last 24 hours: Temp:  [97.8 F (36.6 C)-100 F (37.8 C)] 97.8 F (36.6 C) (07/28 0700) Pulse Rate:  [97-113] 104 (07/28 0800) Resp:  [13-24] 24 (07/28 0800) BP: (81-129)/(59-74) 129/65 (07/28  0800) SpO2:  [94 %-100 %] 98 % (07/28 0800) Weight:  [82.6 kg] 82.6 kg (07/28 0157) Last BM Date: 04/24/19 General:   Pleasant male in NAD Head:  Normocephalic and atraumatic. Eyes:   No icterus.   Conjunctiva pink. Ears:  Normal auditory acuity. Neck:  Supple Lungs:  Respirations even and unlabored. Lungs clear to auscultation bilaterally.   No wheezes, crackles, or rhonchi.  Heart:  Regular rate and rhythm; no MRG Abdomen:  Soft, nondistended, nontender. Normal bowel sounds. No appreciable masses or hepatomegaly.  Rectal:  Not performed.  Msk:  Symmetrical without gross deformities.  Extremities:  Without edema. Neurologic:  Alert and  oriented x4;  grossly normal neurologically. Skin:  Intact without significant lesions or rashes. Psych:  Alert and cooperative. Normal affect.  LAB RESULTS: Recent Labs    04/23/19 1829 04/24/19 0203  WBC 10.4  --   HGB 10.3* 8.4*  HCT 31.2* 24.7*  PLT 215  --    BMET Recent Labs    04/23/19 1829 04/24/19 0203  NA 135 134*  K 4.8 4.6  CL 101 101  CO2 22 22  GLUCOSE 432* 481*  BUN 56* 75*  CREATININE 1.18 1.39*  CALCIUM 8.2* 8.0*   LFT Recent Labs    04/23/19 2010 04/24/19 0203  PROT 6.2* 5.8*  ALBUMIN 3.3* 3.1*  AST 50* 35  ALT 91* 76*  ALKPHOS 61 54  BILITOT 0.9 1.2  BILIDIR 0.1  --   IBILI 0.8  --    PT/INR Recent Labs    04/23/19 2010  LABPROT 15.9*  INR 1.3*    STUDIES: Dg Chest Port 1 View  Result Date: 04/23/2019 CLINICAL DATA:  Weakness, GI bleed EXAM: PORTABLE CHEST 1 VIEW COMPARISON:  01/12/2018 FINDINGS: The heart size and mediastinal contours are within normal limits. Left coronary stent. Both lungs are clear. The visualized skeletal structures are unremarkable. IMPRESSION: No active disease. Electronically Signed   By: Franchot Gallo M.D.   On: 04/23/2019 20:47     PREVIOUS ENDOSCOPIES:            -EGD (2017, Dr. Silverio Decamp): Normal -EGD (2012, Dr. Watt Climes): Questionable  esophageal varices, otherwise  reportedly normal -Colonoscopy (2012, Dr. Watt Climes): Normal   Impression / Plan:   1) Acute blood loss anemia/normocytic anemia 2) Melena 3) Hematemesis 4) Epigastric pain 73 year old male with multiple medical problems to include CAD with DES placed in 11/2017, dual antiplatelet therapy, CHF, hypertension, pancreatic insufficiency/chronic pancreatitis, diabetes, presenting with suspected acute onset upper GI bleed with associated symptomatic anemia.  - Expedited upper endoscopy this morning for diagnostic and therapeutic intent - Held antiplatelet therapy overnight.  Given degree and symptomatic nature of anemia, will elect for urgent endoscopy this morning instead of waiting for full washout of the Brilinta ( T1/2 ~56 hours) - Continue IV PPI for now -Continue n.p.o. status - Continue serial H/H checks with blood transfusion per protocol  5) Elevated AST/ALT: Mildly elevated liver enzymes, which are downtrending this morning.  Previously normal in 12/2017.  - Continue to trend.  No further work-up at this juncture  The indications, risks, and benefits of EGD were explained to the patient in detail. Risks include but are not limited to bleeding, perforation, adverse reaction to medications, and cardiopulmonary compromise. Sequelae include but are not limited to the possibility of surgery, hositalization, and mortality. The patient verbalized understanding and wished to proceed. All questions answered. Further recommendations pending results of the exam.    David Heck, David Lane, [facility]    LOS: 0 days   Lavena Bullion  04/24/2019, 8:27 AM

## 2019-04-25 ENCOUNTER — Inpatient Hospital Stay (HOSPITAL_COMMUNITY): Payer: Medicare Other | Admitting: Anesthesiology

## 2019-04-25 ENCOUNTER — Encounter (HOSPITAL_COMMUNITY): Payer: Self-pay | Admitting: Anesthesiology

## 2019-04-25 ENCOUNTER — Encounter (HOSPITAL_COMMUNITY)
Admission: EM | Disposition: A | Discharge status: Home / Self Care | Payer: Self-pay | Source: Home / Self Care | Attending: Internal Medicine

## 2019-04-25 DIAGNOSIS — K317 Polyp of stomach and duodenum: Secondary | ICD-10-CM

## 2019-04-25 DIAGNOSIS — K259 Gastric ulcer, unspecified as acute or chronic, without hemorrhage or perforation: Secondary | ICD-10-CM

## 2019-04-25 DIAGNOSIS — K6389 Other specified diseases of intestine: Secondary | ICD-10-CM

## 2019-04-25 DIAGNOSIS — K3189 Other diseases of stomach and duodenum: Secondary | ICD-10-CM

## 2019-04-25 HISTORY — PX: ENTEROSCOPY: SHX5533

## 2019-04-25 HISTORY — PX: SUBMUCOSAL TATTOO INJECTION: SHX6856

## 2019-04-25 LAB — HEMOGLOBIN AND HEMATOCRIT, BLOOD
HCT: 25.3 % — ABNORMAL LOW (ref 39.0–52.0)
HCT: 24.6 % — ABNORMAL LOW (ref 39.0–52.0)
HCT: 26.8 % — ABNORMAL LOW (ref 39.0–52.0)
Hemoglobin: 8.3 g/dL — ABNORMAL LOW (ref 13.0–17.0)
Hemoglobin: 8.5 g/dL — ABNORMAL LOW (ref 13.0–17.0)
Hemoglobin: 9.1 g/dL — ABNORMAL LOW (ref 13.0–17.0)

## 2019-04-25 LAB — GLUCOSE, CAPILLARY
Glucose-Capillary: 179 mg/dL — ABNORMAL HIGH (ref 70–99)
Glucose-Capillary: 155 mg/dL — ABNORMAL HIGH (ref 70–99)
Glucose-Capillary: 173 mg/dL — ABNORMAL HIGH (ref 70–99)
Glucose-Capillary: 246 mg/dL — ABNORMAL HIGH (ref 70–99)
Glucose-Capillary: 251 mg/dL — ABNORMAL HIGH (ref 70–99)

## 2019-04-25 LAB — BPAM RBC
Blood Product Expiration Date: 202008242359
Blood Product Expiration Date: 202008242359
ISSUE DATE / TIME: 202007281800
ISSUE DATE / TIME: 202007282036
Unit Type and Rh: 5100
Unit Type and Rh: 5100

## 2019-04-25 LAB — TYPE AND SCREEN
ABO/RH(D): O POS
Antibody Screen: NEGATIVE
Unit division: 0
Unit division: 0

## 2019-04-25 LAB — BASIC METABOLIC PANEL
Anion gap: 4 — ABNORMAL LOW (ref 5–15)
BUN: 30 mg/dL — ABNORMAL HIGH (ref 8–23)
CO2: 27 mmol/L (ref 22–32)
Calcium: 7.9 mg/dL — ABNORMAL LOW (ref 8.9–10.3)
Chloride: 104 mmol/L (ref 98–111)
Creatinine, Ser: 1.01 mg/dL (ref 0.61–1.24)
GFR calc Af Amer: >60 mL/min (ref >60)
GFR calc non Af Amer: >60 mL/min (ref >60)
Glucose, Bld: 260 mg/dL — ABNORMAL HIGH (ref 70–99)
Potassium: 4.2 mmol/L (ref 3.5–5.1)
Sodium: 135 mmol/L (ref 135–145)

## 2019-04-25 SURGERY — ENTEROSCOPY
Anesthesia: Monitor Anesthesia Care

## 2019-04-25 MED ORDER — PROPOFOL 10 MG/ML IV BOLUS
INTRAVENOUS | Status: AC
  Filled 2019-04-25: qty 20

## 2019-04-25 MED ORDER — PEG-KCL-NACL-NASULF-NA ASC-C 100 G PO SOLR
0.5000 | Freq: Once | ORAL | Status: AC
  Administered 2019-04-25: 100 g via ORAL
  Filled 2019-04-25: qty 1

## 2019-04-25 MED ORDER — SPOT INK MARKER SYRINGE KIT
PACK | SUBMUCOSAL | Status: DC | PRN
  Administered 2019-04-25: 3 mL via SUBMUCOSAL

## 2019-04-25 MED ORDER — LACTATED RINGERS IV SOLN
INTRAVENOUS | Status: DC
  Administered 2019-04-25: 09:00:00 via INTRAVENOUS
  Administered 2019-04-25: 1000 mL via INTRAVENOUS

## 2019-04-25 MED ORDER — PROPOFOL 500 MG/50ML IV EMUL
INTRAVENOUS | Status: DC | PRN
  Administered 2019-04-25: 30 mg via INTRAVENOUS
  Administered 2019-04-25: 20 mg via INTRAVENOUS

## 2019-04-25 MED ORDER — PEG-KCL-NACL-NASULF-NA ASC-C 100 G PO SOLR: 1.0000 | Freq: Once | ORAL | Status: DC

## 2019-04-25 MED ORDER — PROPOFOL 500 MG/50ML IV EMUL
INTRAVENOUS | Status: DC | PRN
  Administered 2019-04-25: 100 ug/kg/min via INTRAVENOUS

## 2019-04-25 MED ORDER — SPOT INK MARKER SYRINGE KIT
PACK | SUBMUCOSAL | Status: AC
  Filled 2019-04-25: qty 5

## 2019-04-25 SURGICAL SUPPLY — 15 items

## 2019-04-25 NOTE — Op Note (Signed)
[facility] Patient Name: David Lane Procedure Date: 04/25/2019 MRN: [id number] Attending MD: Justice Britain , MD Date of Birth: 25-Jun-1946 CSN: [id number] Age: 73 Admit Type: Inpatient Procedure:                Small bowel enteroscopy Indications:              Melena, Obscure gastrointestinal bleeding Providers:                Justice Britain, MD, Cleda Daub, RN, Ashley Jacobs, RN, Elspeth Cho Tech., Technician,                            Arnoldo Hooker, CRNA Referring MD:             Gerrit Heck, MD, [facility] Medicines:                Monitored Anesthesia Care Complications:            No immediate complications. Estimated Blood Loss:     Estimated blood loss was minimal. Procedure:                Pre-Anesthesia Assessment:                           - Prior to the procedure, a History and Physical                            was performed, and patient medications and                            allergies were reviewed. The patient's tolerance of                            previous anesthesia was also reviewed. The risks                            and benefits of the procedure and the sedation                            options and risks were discussed with the patient.                            All questions were answered, and informed consent                            was obtained. Prior Anticoagulants: The patient has                            taken Brillanta, last dose was 3 days prior to                            procedure. ASA Grade Assessment: III - A patient  with severe systemic disease. After reviewing the                            risks and benefits, the patient was deemed in                            satisfactory condition to undergo the procedure.                           After obtaining informed consent, the endoscope was                            passed under direct  vision. Throughout the                            procedure, the patient's blood pressure, pulse, and                            oxygen saturations were monitored continuously. The                            [id number]-H190 ([id number]) Olympus gastroscope was                            introduced through the mouth, and advanced to the                            second part of duodenum. The [id number]-[id number] ([id number])                            Olympus duodenoscope was introduced through the                            mouth and advanced to the second part of duodenum.                            The [id number]-[id number] ([id number]) Olympus ultra slim                            endoscope was introduced through the mouth and                            advanced to the proximal jejunum. After obtaining                            informed consent, the endoscope was passed under                            direct vision. Throughout the procedure, the                            patient's blood pressure, pulse, and oxygen  saturations were monitored continuously. The small                            bowel enteroscopy was accomplished without                            difficulty. The patient tolerated the procedure. Scope In: Scope Out: Findings:      No gross lesions were noted in the proximal esophagus and in the mid       esophagus.      LA Grade B (one or more mucosal breaks greater than 5 mm, not extending       between the tops of two mucosal folds) esophagitis with no bleeding was       found in the distal esophagus and at the [employer].      Multiple small semi-sessile polyps were found in the gastric fundus and       in the gastric body - consistent with fundic gland polyps.      One non-bleeding linear gastric ulcer with a clean ulcer base (Forrest       Class III) was found within the pylorus. The lesion was 8 mm in largest       dimension.      No other gross lesions were noted in the  entire examined stomach.      Localized nodular mucosa was found at the apex of the duodenal bulb -       consistent with Brunner's hyperplasia.      A mild angulation deformity was found in the D1/D2 sweep. With gentle       pressure and with manuevering, we could traverse this area with the       endoscope/duodenoscope/ultra-slim colonoscope.      Normal mucosa was found in the second portion of the duodenum, in the       third portion of the duodenum and in the fourth portion of the duodenum.      There was no evidence of significant pathology in the major papilla.      Normal mucosa was found in the proximal jejunum. Area was tattooed with       an injection of Spot (carbon black) to demarcate the distal extent of       the enteroscopy. Impression:               - No gross lesions in promixal/middle esophagus. LA                            Grade B esophagitis distally.                           - Multiple gastric polyps (likely fundic).                           - Non-bleeding gastric ulcer with a clean ulcer                            base (Forrest Class III).                           - Nodular mucosa in the duodenal bulb (likely  Bruenner's hyperplasia).                           - Duodenal angulation deformity at the sweep.                           - Normal mucosa was found in the second portion of                            the duodenum, in the third portion of the duodenum                            and in the fourth portion of the duodenum.                           - Normal major papilla.                           - Normal mucosa was found in the proximal jejunum.                            Tattooed distal extent of enteroscopy. Recommendation:           - The patient will be observed post-procedure,                            until all discharge criteria are met.                           - Return patient to hospital ward for ongoing care.                            - Trend Hgb/Hct.                           - Will add-on H. pylori IgG and if positive move                            forward with treating.                           - May transition IV PPI to 40 mg PO PPI BID.                           - Will discuss consideration of colonoscopy with                            patient.                           - Diet to be advanced if no desire for colonoscopy.                           - The findings and recommendations were discussed  with the patient.                           - The findings and recommendations were discussed                            with the referring physician. Procedure Code(s):        --- Professional ---                           [phone], Small intestinal endoscopy, enteroscopy                            beyond second portion of duodenum, not including                            ileum; diagnostic, including collection of                            specimen(s) by brushing or washing, when performed                            (separate procedure)                           44799, Unlisted procedure, small intestine Diagnosis Code(s):        --- Professional ---                           K20.9, Esophagitis, unspecified                           K25.9, Gastric ulcer, unspecified as acute or                            chronic, without hemorrhage or perforation                           K31.7, Polyp of stomach and duodenum                           K31.89, Other diseases of stomach and duodenum                           K92.1, Melena (includes Hematochezia)                           K92.2, Gastrointestinal hemorrhage, unspecified CPT copyright 2019 American Medical Association. All rights reserved. The codes documented in this report are preliminary and upon coder review may  be revised to meet current compliance requirements. Justice Britain, MD 04/25/2019 10:30:08 AM Number of  Addenda: 0

## 2019-04-25 NOTE — Interval H&P Note (Signed)
History and Physical Interval Note:  04/25/2019 9:20 AM  David Lane  has presented today for surgery, with the diagnosis of GI bleed.  The various methods of treatment have been discussed with the patient and family. After consideration of risks, benefits and other options for treatment, the patient has consented to  Procedure(s): ESOPHAGOGASTRODUODENOSCOPY (EGD) WITH PROPOFOL (N/A) as a surgical intervention.  The patient's history has been reviewed, patient examined, no change in status, stable for surgery.  I have reviewed the patient's chart and labs.  Questions were answered to the patient's satisfaction.     [employer]

## 2019-04-25 NOTE — Anesthesia Preprocedure Evaluation (Signed)
Anesthesia Evaluation  Patient identified by MRN, date of birth, ID band Patient awake    Reviewed: Allergy & Precautions, NPO status , Patient's Chart, lab work & pertinent test results  Airway Mallampati: I  TM Distance: >3 FB Neck ROM: Full    Dental   Pulmonary    Pulmonary exam normal        Cardiovascular hypertension, Pt. on medications + CAD, + Past MI and + Cardiac Stents  Normal cardiovascular exam     Neuro/Psych Anxiety    GI/Hepatic GERD  Medicated and Controlled,  Endo/Other  diabetes, Type 2, Insulin Dependent  Renal/GU      Musculoskeletal   Abdominal   Peds  Hematology  (+) Blood dyscrasia, anemia ,   Anesthesia Other Findings   Reproductive/Obstetrics                             Anesthesia Physical  Anesthesia Plan  ASA: III  Anesthesia Plan: MAC   Post-op Pain Management:    Induction: Intravenous  PONV Risk Score and Plan: 1  Airway Management Planned: Simple Face Mask  Additional Equipment:   Intra-op Plan:   Post-operative Plan:   Informed Consent: I have reviewed the patients History and Physical, chart, labs and discussed the procedure including the risks, benefits and alternatives for the proposed anesthesia with the patient or authorized representative who has indicated his/her understanding and acceptance.       Plan Discussed with: CRNA, Surgeon and Anesthesiologist  Anesthesia Plan Comments:         Anesthesia Quick Evaluation

## 2019-04-25 NOTE — Plan of Care (Signed)

## 2019-04-25 NOTE — Progress Notes (Signed)
Resumed care for this pt at 2300. I agree with previous RN's assessment. Blood transfusion almost complete at that time. Pt resting comfortably in bed. Will continue to monitor.

## 2019-04-25 NOTE — Progress Notes (Signed)
PROGRESS NOTE    David Lane  JYN:[id number] DOB: 1946-08-20 DOA: 04/23/2019 PCP: Luetta Nutting, DO    Brief Narrative:  David Lane is a 73 y.o. male with medical history significant for coronary artery disease, chronic combined systolic and diastolic CHF, pancreatic insufficiency, insulin-dependent diabetes mellitus, and hypertension, now presenting to the emergency department for evaluation of melena, dark vomitus, lightheadedness, and elevated blood sugars Assessment & Plan:   Principal Problem:   Acute upper GI bleed Active Problems:   Essential hypertension   CAD, 07/14/12- LAD/PDA DES   Ischemic cardiomyopathy, EF 50% by echo 08/01/13   Pancreatic insufficiency   Type 1 diabetes mellitus with complications ([place])   Duodenitis   Gastroesophageal reflux disease with esophagitis   Acute GI bleed:  S/p EGD showing the following.  No gross lesions in promixal/middle esophagus. LA Grade B esophagitis distally.  Multiple gastric polyps (likely fundic). - Non-bleeding gastric ulcer with a clean ulcer base (Forrest Class III). - Nodular mucosa in the duodenal bulb (likely Bruenner's hyperplasia). - Duodenal angulation deformity at the sweep.  Continue with PPI BID,  ? Colonoscopy, defer to gastroenterology. Possibly tomorrow. Advance diet as per GI recommendations.    Hypertension  Well controlled.    Acute anemia of blood loss:  Transfuse to keep hemoglobin greater than 7.      H/o of CAD , with s/p PCI TO LAD, and ischemic cardiomyopathy:       Type 1 DM  CBG (last 3)  Recent Labs    04/24/19 2346 04/25/19 0442 04/25/19 0730  GLUCAP 227* 179* 155*   Resume home dose of insulin if able to eat >50% of the meals.     History of chronic combined systolic and diastolic heart failure Patient appears to be overall euvolemic at this time.  DVT prophylaxis: scd's Code Status: full code.  Family Communication: none at bedside.  Disposition Plan:  pending further work up .   Consultants:   Gastroenterology Dr Rush Landmark.    Procedures: EGD.   Antimicrobials:none.   Subjective: No chest pain or sob. Nausea , vomiting or abdominal pain.   Objective: Vitals:   04/25/19 1022 04/25/19 1030 04/25/19 1040 04/25/19 1102  BP: (!) 85/59 (!) 117/57 140/63 127/77  Pulse: 72 68 75 70  Resp: (!) 24 (!) 21 20 20   Temp: 97.6 F (36.4 C)   97.7 F (36.5 C)  TempSrc: Oral   Oral  SpO2: 100% 100% 100% 100%  Weight:      Height:        Intake/Output Summary (Last 24 hours) at 04/25/2019 1115 Last data filed at 04/25/2019 1102 Gross per 24 hour  Intake 2231.55 ml  Output 2800 ml  Net -568.45 ml   Filed Weights   04/24/19 2218 04/25/19 0445 04/25/19 0819  Weight: 85 kg 85.2 kg 85.2 kg    Examination:  General exam: Appears calm and comfortable  Respiratory system: Clear to auscultation. Respiratory effort normal. Cardiovascular system: S1 & S2 heard, RRR.Marland Kitchen No pedal edema. Gastrointestinal system: Abdomen is nondistended, soft and nontender. No organomegaly or masses felt. Normal bowel sounds heard. Central nervous system: Alert and oriented. No focal neurological deficits. Extremities: Symmetric 5 x 5 power. Skin: No rashes, lesions or ulcers Psychiatry:  Mood & affect appropriate.     Data Reviewed: I have personally reviewed following labs and imaging studies  CBC: Recent Labs  Lab 04/23/19 1829 04/24/19 0203 04/24/19 1018 04/24/19 1606 04/25/19 0115 04/25/19 0727  WBC 10.4  --   --   --   --   --  HGB 10.3* 8.4* 7.4* 7.1* 8.3* 8.5*  HCT 31.2* 24.7* 22.2* 20.9* 25.3* 24.6*  MCV 95.7  --   --   --   --   --   PLT 215  --   --   --   --   --    Basic Metabolic Panel: Recent Labs  Lab 04/23/19 1829 04/24/19 0203  NA 135 134*  K 4.8 4.6  CL 101 101  CO2 22 22  GLUCOSE 432* 481*  BUN 56* 75*  CREATININE 1.18 1.39*  CALCIUM 8.2* 8.0*   GFR: Estimated Creatinine Clearance: 54.3 mL/min (A) (by C-G  formula based on SCr of 1.39 mg/dL (H)). Liver Function Tests: Recent Labs  Lab 04/23/19 2010 04/24/19 0203  AST 50* 35  ALT 91* 76*  ALKPHOS 61 54  BILITOT 0.9 1.2  PROT 6.2* 5.8*  ALBUMIN 3.3* 3.1*   No results for input(s): LIPASE, AMYLASE in the last 168 hours. No results for input(s): AMMONIA in the last 168 hours. Coagulation Profile: Recent Labs  Lab 04/23/19 2010  INR 1.3*   Cardiac Enzymes: No results for input(s): CKTOTAL, CKMB, CKMBINDEX, TROPONINI in the last 168 hours. BNP (last 3 results) No results for input(s): PROBNP in the last 8760 hours. HbA1C: No results for input(s): HGBA1C in the last 72 hours. CBG: Recent Labs  Lab 04/24/19 1516 04/24/19 1940 04/24/19 2346 04/25/19 0442 04/25/19 0730  GLUCAP 183* 299* 227* 179* 155*   Lipid Profile: No results for input(s): CHOL, HDL, LDLCALC, TRIG, CHOLHDL, LDLDIRECT in the last 72 hours. Thyroid Function Tests: No results for input(s): TSH, T4TOTAL, FREET4, T3FREE, THYROIDAB in the last 72 hours. Anemia Panel: No results for input(s): VITAMINB12, FOLATE, FERRITIN, TIBC, IRON, RETICCTPCT in the last 72 hours. Sepsis Labs: No results for input(s): PROCALCITON, LATICACIDVEN in the last 168 hours.  Recent Results (from the past 240 hour(s))  SARS Coronavirus 2 (CEPHEID - Performed in [place] hospital lab), Hosp Order     Status: None   Collection Time: 04/23/19  8:10 PM   Specimen: Nasopharyngeal Swab  Result Value Ref Range Status   SARS Coronavirus 2 NEGATIVE NEGATIVE Final    Comment: (NOTE) If result is NEGATIVE SARS-CoV-2 target nucleic acids are NOT DETECTED. The SARS-CoV-2 RNA is generally detectable in upper and lower  respiratory specimens during the acute phase of infection. The lowest  concentration of SARS-CoV-2 viral copies this assay can detect is 250  copies / mL. A negative result does not preclude SARS-CoV-2 infection  and should not be used as the sole basis for treatment or other   patient management decisions.  A negative result may occur with  improper specimen collection / handling, submission of specimen other  than nasopharyngeal swab, presence of viral mutation(s) within the  areas targeted by this assay, and inadequate number of viral copies  (<250 copies / mL). A negative result must be combined with clinical  observations, patient history, and epidemiological information. If result is POSITIVE SARS-CoV-2 target nucleic acids are DETECTED. The SARS-CoV-2 RNA is generally detectable in upper and lower  respiratory specimens dur ing the acute phase of infection.  Positive  results are indicative of active infection with SARS-CoV-2.  Clinical  correlation with patient history and other diagnostic information is  necessary to determine patient infection status.  Positive results do  not rule out bacterial infection or co-infection with other viruses. If result is PRESUMPTIVE POSTIVE SARS-CoV-2 nucleic acids MAY BE PRESENT.   A presumptive positive  result was obtained on the submitted specimen  and confirmed on repeat testing.  While 2019 novel coronavirus  (SARS-CoV-2) nucleic acids may be present in the submitted sample  additional confirmatory testing may be necessary for epidemiological  and / or clinical management purposes  to differentiate between  SARS-CoV-2 and other Sarbecovirus currently known to infect humans.  If clinically indicated additional testing with an alternate test  methodology [phone]) is advised. The SARS-CoV-2 RNA is generally  detectable in upper and lower respiratory sp ecimens during the acute  phase of infection. The expected result is Negative. Fact Sheet for Patients:  StrictlyIdeas.no Fact Sheet for Healthcare Providers: BankingDealers.co.za This test is not yet approved or cleared by the [place] FDA and has been authorized for detection and/or diagnosis of SARS-CoV-2 by  FDA under an Emergency Use Authorization (EUA).  This EUA will remain in effect (meaning this test can be used) for the duration of the COVID-19 declaration under Section 564(b)(1) of the Act, 21 U.S.C. section 360bbb-3(b)(1), unless the authorization is terminated or revoked sooner. Performed at [facility], [place] [address] [place], [place] [address]   MRSA PCR Screening     Status: None   Collection Time: 04/24/19  1:45 AM   Specimen: Nasal Mucosa; Nasopharyngeal  Result Value Ref Range Status   MRSA by PCR NEGATIVE NEGATIVE Final    Comment:        The GeneXpert MRSA Assay (FDA approved for NASAL specimens only), is one component of a comprehensive MRSA colonization surveillance program. It is not intended to diagnose MRSA infection nor to guide or monitor treatment for MRSA infections. Performed at [facility], [place] [address], [place], [place] [address]          Radiology Studies: Ct Abdomen Pelvis W Contrast  Result Date: 04/24/2019 CLINICAL DATA:  Nausea, vomiting with loose dark stools and coffee ground emesis. Prior history of pancreatitis. EXAM: CT ABDOMEN AND PELVIS WITH CONTRAST TECHNIQUE: Multidetector CT imaging of the abdomen and pelvis was performed using the standard protocol following bolus administration of intravenous contrast. CONTRAST:  187mL OMNIPAQUE IOHEXOL 300 MG/ML  SOLN COMPARISON:  MRI abdomen from 2012 FINDINGS: Lower chest: The lung bases are clear of acute process. No pleural effusion or pulmonary lesions. The heart is normal in size. No pericardial effusion. The distal esophagus and aorta are unremarkable. Hepatobiliary: 3.5 cm lobulated segment 8 liver lesion consistent with known hepatic hemangioma. Typical nodular peripheral enhancement and unchanged since 2012. Pancreas: Marked pancreatic atrophy and diffuse calcifications consistent with chronic calcific pancreatitis. No findings for acute inflammation and  no mass. No worrisome hepatic lesions or intrahepatic biliary dilatation. The gallbladder is unremarkable. No common bile duct dilatation. Spleen: Normal size.  No focal lesions. Adrenals/Urinary Tract: The adrenal glands and kidneys are unremarkable. No renal masses or hydronephrosis. The bladder is unremarkable. Stomach/Bowel: The stomach, duodenum, small bowel and colon are unremarkable. No acute inflammatory changes, mass lesions or obstructive findings. Vascular/Lymphatic: The aorta and branch vessels are patent. Scattered atherosclerotic calcifications. The major venous structures are patent. No mesenteric or retroperitoneal mass or adenopathy. Reproductive: Mild prostate gland enlargement. Other: No pelvic mass or adenopathy. No free pelvic fluid collections. No inguinal mass or adenopathy. No abdominal wall hernia or subcutaneous lesions. Musculoskeletal: No significant bony findings. IMPRESSION: 1. No acute abdominal/pelvic findings, mass lesions or adenopathy. 2. Stable right hepatic lobe hemangioma. 3. Changes of chronic calcific pancreatitis but no definite findings for acute inflammation. 4. Three-vessel coronary  artery calcifications. Electronically Signed   By: Marijo Sanes M.D.   On: 04/24/2019 15:15   Dg Chest Port 1 View  Result Date: 04/23/2019 CLINICAL DATA:  Weakness, GI bleed EXAM: PORTABLE CHEST 1 VIEW COMPARISON:  01/12/2018 FINDINGS: The heart size and mediastinal contours are within normal limits. Left coronary stent. Both lungs are clear. The visualized skeletal structures are unremarkable. IMPRESSION: No active disease. Electronically Signed   By: Franchot Gallo M.D.   On: 04/23/2019 20:47        Scheduled Meds: . sodium chloride   Intravenous Once  . sodium chloride   Intravenous Once  . sodium chloride   Intravenous Once  . Chlorhexidine Gluconate Cloth  6 each Topical Daily  . insulin aspart  0-9 Units Subcutaneous Q4H  . insulin glargine  30 Units Subcutaneous Daily   . sodium chloride flush  3 mL Intravenous Q12H   Continuous Infusions: . lactated ringers 1,000 mL (04/25/19 0829)  . pantoprozole (PROTONIX) infusion 8 mg/hr (04/25/19 0600)     LOS: 1 day    Time spent: 32 minutes.     Hosie Poisson, MD [facility] Pager [phone]   If 7PM-7AM, please contact night-coverage [email or web address] Password [email or web address] 04/25/2019, 11:15 AM

## 2019-04-25 NOTE — Transfer of Care (Signed)
Immediate Anesthesia Transfer of Care Note  Patient: David Lane  Procedure(s) Performed: Procedure(s): ENTEROSCOPY (N/A) SUBMUCOSAL TATTOO INJECTION  Patient Location: PACU  Anesthesia Type:MAC  Level of Consciousness:  sedated, patient cooperative and responds to stimulation  Airway & Oxygen Therapy:Patient Spontanous Breathing and Patient connected to face mask oxgen  Post-op Assessment:  Report given to PACU RN and Post -op Vital signs reviewed and stable  Post vital signs:  Reviewed and stable  Last Vitals:  Vitals:   04/25/19 0819 04/25/19 1022  BP: (!) 145/68   Pulse: 70 72  Resp: 16 (!) 24  Temp: 36.8 C 36.4 C  SpO2: 301% 601%    Complications: No apparent anesthesia complications

## 2019-04-25 NOTE — Anesthesia Postprocedure Evaluation (Signed)
Anesthesia Post Note  Patient: David Lane  Procedure(s) Performed: ENTEROSCOPY (N/A ) SUBMUCOSAL TATTOO INJECTION     Patient location during evaluation: PACU Anesthesia Type: MAC Level of consciousness: awake and alert Pain management: pain level controlled Vital Signs Assessment: post-procedure vital signs reviewed and stable Respiratory status: spontaneous breathing, nonlabored ventilation, respiratory function stable and patient connected to nasal cannula oxygen Cardiovascular status: stable and blood pressure returned to baseline Postop Assessment: no apparent nausea or vomiting Anesthetic complications: no    Last Vitals:  Vitals:   04/25/19 1040 04/25/19 1102  BP: 140/63 127/77  Pulse: 75 70  Resp: 20 20  Temp:  36.5 C  SpO2: 100% 100%    Last Pain:  Vitals:   04/25/19 1102  TempSrc: Oral  PainSc:                  Dondi Burandt

## 2019-04-26 ENCOUNTER — Encounter (HOSPITAL_COMMUNITY): Payer: Self-pay | Admitting: Emergency Medicine

## 2019-04-26 ENCOUNTER — Inpatient Hospital Stay (HOSPITAL_COMMUNITY): Payer: Medicare Other | Admitting: Anesthesiology

## 2019-04-26 ENCOUNTER — Encounter (HOSPITAL_COMMUNITY)
Admission: EM | Disposition: A | Discharge status: Home / Self Care | Payer: Self-pay | Source: Home / Self Care | Attending: Internal Medicine

## 2019-04-26 DIAGNOSIS — K635 Polyp of colon: Secondary | ICD-10-CM

## 2019-04-26 HISTORY — PX: HOT HEMOSTASIS: SHX5433

## 2019-04-26 HISTORY — PX: COLONOSCOPY WITH PROPOFOL: SHX5780

## 2019-04-26 HISTORY — PX: POLYPECTOMY: SHX5525

## 2019-04-26 HISTORY — PX: HEMOSTASIS CLIP PLACEMENT: SHX6857

## 2019-04-26 HISTORY — PX: BIOPSY: SHX5522

## 2019-04-26 LAB — GLUCOSE, CAPILLARY
Glucose-Capillary: 138 mg/dL — ABNORMAL HIGH (ref 70–99)
Glucose-Capillary: 146 mg/dL — ABNORMAL HIGH (ref 70–99)
Glucose-Capillary: 153 mg/dL — ABNORMAL HIGH (ref 70–99)
Glucose-Capillary: 160 mg/dL — ABNORMAL HIGH (ref 70–99)
Glucose-Capillary: 183 mg/dL — ABNORMAL HIGH (ref 70–99)
Glucose-Capillary: 198 mg/dL — ABNORMAL HIGH (ref 70–99)
Glucose-Capillary: 220 mg/dL — ABNORMAL HIGH (ref 70–99)

## 2019-04-26 LAB — HEMOGLOBIN AND HEMATOCRIT, BLOOD
HCT: 25 % — ABNORMAL LOW (ref 39.0–52.0)
HCT: 23.7 % — ABNORMAL LOW (ref 39.0–52.0)
Hemoglobin: 8.1 g/dL — ABNORMAL LOW (ref 13.0–17.0)
Hemoglobin: 7.9 g/dL — ABNORMAL LOW (ref 13.0–17.0)

## 2019-04-26 LAB — H. PYLORI ANTIBODY, IGG: H Pylori IgG: 0.2 Index Value (ref 0.00–0.79)

## 2019-04-26 SURGERY — COLONOSCOPY WITH PROPOFOL
Anesthesia: Monitor Anesthesia Care

## 2019-04-26 MED ORDER — PROPOFOL 10 MG/ML IV BOLUS
INTRAVENOUS | Status: AC
  Filled 2019-04-26: qty 40

## 2019-04-26 MED ORDER — EPHEDRINE SULFATE-NACL 50-0.9 MG/10ML-% IV SOSY
PREFILLED_SYRINGE | INTRAVENOUS | Status: DC | PRN
  Administered 2019-04-26: 10 mg via INTRAVENOUS
  Administered 2019-04-26: 5 mg via INTRAVENOUS

## 2019-04-26 MED ORDER — PROPOFOL 10 MG/ML IV BOLUS
INTRAVENOUS | Status: AC
  Filled 2019-04-26: qty 20

## 2019-04-26 MED ORDER — LACTATED RINGERS IV SOLN
INTRAVENOUS | Status: AC | PRN
  Administered 2019-04-26: 1000 mL via INTRAVENOUS
  Administered 2019-04-26: 11:00:00 via INTRAVENOUS

## 2019-04-26 MED ORDER — PANTOPRAZOLE SODIUM 40 MG PO TBEC
40.0000 mg | DELAYED_RELEASE_TABLET | Freq: Two times a day (BID) | ORAL | Status: DC
  Administered 2019-04-26 – 2019-04-27 (×2): 40 mg via ORAL
  Filled 2019-04-26 (×2): qty 1

## 2019-04-26 MED ORDER — PROPOFOL 500 MG/50ML IV EMUL
INTRAVENOUS | Status: DC | PRN
  Administered 2019-04-26: 75 ug/kg/min via INTRAVENOUS

## 2019-04-26 MED ORDER — PROPOFOL 500 MG/50ML IV EMUL
INTRAVENOUS | Status: DC | PRN
  Administered 2019-04-26: 10 mg via INTRAVENOUS
  Administered 2019-04-26: 40 mg via INTRAVENOUS
  Administered 2019-04-26: 20 mg via INTRAVENOUS

## 2019-04-26 SURGICAL SUPPLY — 23 items
ELECT REM PT RETURN 9FT ADLT (ELECTROSURGICAL)
ELECTRODE REM PT RTRN 9FT ADLT (ELECTROSURGICAL) IMPLANT
FCP BXJMBJMB 240X2.8X (CUTTING FORCEPS)
FLOOR PAD 36X40 (MISCELLANEOUS) ×3
FORCEPS BIOP RAD 4 LRG CAP 4 (CUTTING FORCEPS) IMPLANT
FORCEPS BIOP RJ4 240 W/NDL (CUTTING FORCEPS)
FORCEPS BXJMBJMB 240X2.8X (CUTTING FORCEPS) IMPLANT
INJECTOR/SNARE I SNARE (MISCELLANEOUS) IMPLANT
LUBRICANT JELLY 4.5OZ STERILE (MISCELLANEOUS) IMPLANT
MANIFOLD NEPTUNE II (INSTRUMENTS) IMPLANT
NDL SCLEROTHERAPY 25GX240 (NEEDLE) IMPLANT
NEEDLE SCLEROTHERAPY 25GX240 (NEEDLE) IMPLANT
PAD FLOOR 36X40 (MISCELLANEOUS) ×2 IMPLANT
PROBE APC STR FIRE (PROBE) IMPLANT
PROBE INJECTION GOLD (MISCELLANEOUS)
PROBE INJECTION GOLD 7FR (MISCELLANEOUS) IMPLANT
SNARE ROTATE MED OVAL 20MM (MISCELLANEOUS) IMPLANT
SYR 50ML LL SCALE MARK (SYRINGE) IMPLANT
TOWEL COTTON PACK 4EA (MISCELLANEOUS) ×6 IMPLANT
TRAP SPECIMEN MUCOUS 40CC (MISCELLANEOUS) IMPLANT
TUBING ENDO SMARTCAP PENTAX (MISCELLANEOUS) IMPLANT
TUBING IRRIGATION ENDOGATOR (MISCELLANEOUS) ×3 IMPLANT
WATER STERILE IRR 1000ML POUR (IV SOLUTION) IMPLANT

## 2019-04-26 NOTE — Progress Notes (Signed)
PROGRESS NOTE    David Lane  UXL:[id number] DOB: 03/12/1946 DOA: 04/23/2019 PCP: Luetta Nutting, DO    Brief Narrative:  David Lane is a 73 y.o. male with medical history significant for coronary artery disease, chronic combined systolic and diastolic CHF, pancreatic insufficiency, insulin-dependent diabetes mellitus, and hypertension, now presenting to the emergency department for evaluation of melena, dark vomitus, lightheadedness, and elevated blood sugars Assessment & Plan:   Principal Problem:   Acute upper GI bleed Active Problems:   Essential hypertension   CAD, 07/14/12- LAD/PDA DES   Ischemic cardiomyopathy, EF 50% by echo 08/01/13   Pancreatic insufficiency   Type 1 diabetes mellitus with complications ([place])   Duodenitis   Gastroesophageal reflux disease with esophagitis   Acute GI bleed:  S/p EGD showing the following.  No gross lesions in promixal/middle esophagus. LA Grade B esophagitis distally.  Multiple gastric polyps (likely fundic). - Non-bleeding gastric ulcer with a clean ulcer base (Forrest Class III). - Nodular mucosa in the duodenal bulb (likely Bruenner's hyperplasia). - Duodenal angulation deformity at the sweep.  Continue with PPI BID,  Pt underwent colonoscopy, reviewed the results of the colonoscopy.  Advance diet as per GI recommendations.    Hypertension  Well controlled.    Acute anemia of blood loss:  Transfuse to keep hemoglobin greater than 7. Currently at 8.1.   H/o of CAD , with s/p PCI TO LAD, and ischemic cardiomyopathy:       Type 1 DM  CBG (last 3)  Recent Labs    04/26/19 0737 04/26/19 1009 04/26/19 1415  GLUCAP 153* 160* 183*   Resume home dose of insulin if able to eat >50% of the meals.     History of chronic combined systolic and diastolic heart failure Patient appears to be overall euvolemic at this time. No sob or chest pain.   DVT prophylaxis: scd's Code Status: full code.  Family  Communication: none at bedside.  Disposition Plan: possible d/c in am.   Consultants:   Gastroenterology Dr Rush Landmark.    Procedures: EGD.   colonoscopy on 7/30  Antimicrobials:none.   Subjective: No chest pain or sob. Nausea , vomiting or abdominal pain.  Pt reports feeling sleepy after the procedure.   Objective: Vitals:   04/26/19 1308 04/26/19 1310 04/26/19 1320 04/26/19 1400  BP: (!) 116/47 (!) 128/51 (!) 111/45 (!) 141/72  Pulse: 75 77 79 83  Resp: 17 16 (!) 21 18  Temp: 97.6 F (36.4 C)   97.8 F (36.6 C)  TempSrc: Temporal   Oral  SpO2: 100% 100% 99% 100%  Weight:      Height:        Intake/Output Summary (Last 24 hours) at 04/26/2019 1524 Last data filed at 04/26/2019 1307 Gross per 24 hour  Intake 2473.74 ml  Output 975 ml  Net 1498.74 ml   Filed Weights   04/25/19 0819 04/26/19 0500 04/26/19 0956  Weight: 85.2 kg 85.5 kg 85.5 kg    Examination:  General exam: Appears calm and comfortable , no distress noted.  Respiratory system: Clear to auscultation. Respiratory effort normal. Cardiovascular system: S1 & S2 heard, RRR.Marland Kitchen No pedal edema. Gastrointestinal system: Abdomen is soft NT nd bs+ Central nervous system: Alert and oriented. No focal neurological deficits. Extremities: Symmetric 5 x 5 power. Skin: No rashes, lesions or ulcers Psychiatry:  Mood & affect appropriate.     Data Reviewed: I have personally reviewed following labs and imaging studies  CBC: Recent Labs  Lab 04/23/19 1829  04/24/19 1606 04/25/19 0115 04/25/19 0727 04/25/19 1502 04/26/19 0525  WBC 10.4  --   --   --   --   --   --   HGB 10.3*   < > 7.1* 8.3* 8.5* 9.1* 8.1*  HCT 31.2*   < > 20.9* 25.3* 24.6* 26.8* 25.0*  MCV 95.7  --   --   --   --   --   --   PLT 215  --   --   --   --   --   --    < > = values in this interval not displayed.   Basic Metabolic Panel: Recent Labs  Lab 04/23/19 1829 04/24/19 0203 04/25/19 1502  NA 135 134* 135  K 4.8 4.6 4.2  CL  101 101 104  CO2 22 22 27   GLUCOSE 432* 481* 260*  BUN 56* 75* 30*  CREATININE 1.18 1.39* 1.01  CALCIUM 8.2* 8.0* 7.9*   GFR: Estimated Creatinine Clearance: 74.7 mL/min (by C-G formula based on SCr of 1.01 mg/dL). Liver Function Tests: Recent Labs  Lab 04/23/19 2010 04/24/19 0203  AST 50* 35  ALT 91* 76*  ALKPHOS 61 54  BILITOT 0.9 1.2  PROT 6.2* 5.8*  ALBUMIN 3.3* 3.1*   No results for input(s): LIPASE, AMYLASE in the last 168 hours. No results for input(s): AMMONIA in the last 168 hours. Coagulation Profile: Recent Labs  Lab 04/23/19 2010  INR 1.3*   Cardiac Enzymes: No results for input(s): CKTOTAL, CKMB, CKMBINDEX, TROPONINI in the last 168 hours. BNP (last 3 results) No results for input(s): PROBNP in the last 8760 hours. HbA1C: No results for input(s): HGBA1C in the last 72 hours. CBG: Recent Labs  Lab 04/26/19 0010 04/26/19 0344 04/26/19 0737 04/26/19 1009 04/26/19 1415  GLUCAP 198* 138* 153* 160* 183*   Lipid Profile: No results for input(s): CHOL, HDL, LDLCALC, TRIG, CHOLHDL, LDLDIRECT in the last 72 hours. Thyroid Function Tests: No results for input(s): TSH, T4TOTAL, FREET4, T3FREE, THYROIDAB in the last 72 hours. Anemia Panel: No results for input(s): VITAMINB12, FOLATE, FERRITIN, TIBC, IRON, RETICCTPCT in the last 72 hours. Sepsis Labs: No results for input(s): PROCALCITON, LATICACIDVEN in the last 168 hours.  Recent Results (from the past 240 hour(s))  SARS Coronavirus 2 (CEPHEID - Performed in [place] hospital lab), Hosp Order     Status: None   Collection Time: 04/23/19  8:10 PM   Specimen: Nasopharyngeal Swab  Result Value Ref Range Status   SARS Coronavirus 2 NEGATIVE NEGATIVE Final    Comment: (NOTE) If result is NEGATIVE SARS-CoV-2 target nucleic acids are NOT DETECTED. The SARS-CoV-2 RNA is generally detectable in upper and lower  respiratory specimens during the acute phase of infection. The lowest  concentration of  SARS-CoV-2 viral copies this assay can detect is 250  copies / mL. A negative result does not preclude SARS-CoV-2 infection  and should not be used as the sole basis for treatment or other  patient management decisions.  A negative result may occur with  improper specimen collection / handling, submission of specimen other  than nasopharyngeal swab, presence of viral mutation(s) within the  areas targeted by this assay, and inadequate number of viral copies  (<250 copies / mL). A negative result must be combined with clinical  observations, patient history, and epidemiological information. If result is POSITIVE SARS-CoV-2 target nucleic acids are DETECTED. The SARS-CoV-2 RNA is generally detectable in upper and lower  respiratory  specimens dur ing the acute phase of infection.  Positive  results are indicative of active infection with SARS-CoV-2.  Clinical  correlation with patient history and other diagnostic information is  necessary to determine patient infection status.  Positive results do  not rule out bacterial infection or co-infection with other viruses. If result is PRESUMPTIVE POSTIVE SARS-CoV-2 nucleic acids MAY BE PRESENT.   A presumptive positive result was obtained on the submitted specimen  and confirmed on repeat testing.  While 2019 novel coronavirus  (SARS-CoV-2) nucleic acids may be present in the submitted sample  additional confirmatory testing may be necessary for epidemiological  and / or clinical management purposes  to differentiate between  SARS-CoV-2 and other Sarbecovirus currently known to infect humans.  If clinically indicated additional testing with an alternate test  methodology [phone]) is advised. The SARS-CoV-2 RNA is generally  detectable in upper and lower respiratory sp ecimens during the acute  phase of infection. The expected result is Negative. Fact Sheet for Patients:  StrictlyIdeas.no Fact Sheet for Healthcare  Providers: BankingDealers.co.za This test is not yet approved or cleared by the [place] FDA and has been authorized for detection and/or diagnosis of SARS-CoV-2 by FDA under an Emergency Use Authorization (EUA).  This EUA will remain in effect (meaning this test can be used) for the duration of the COVID-19 declaration under Section 564(b)(1) of the Act, 21 U.S.C. section 360bbb-3(b)(1), unless the authorization is terminated or revoked sooner. Performed at [facility], [place] [address], [place], [place] [address]   MRSA PCR Screening     Status: None   Collection Time: 04/24/19  1:45 AM   Specimen: Nasal Mucosa; Nasopharyngeal  Result Value Ref Range Status   MRSA by PCR NEGATIVE NEGATIVE Final    Comment:        The GeneXpert MRSA Assay (FDA approved for NASAL specimens only), is one component of a comprehensive MRSA colonization surveillance program. It is not intended to diagnose MRSA infection nor to guide or monitor treatment for MRSA infections. Performed at [facility], [place] [address] [place], [place] [address]          Radiology Studies: No results found.      Scheduled Meds: . sodium chloride   Intravenous Once  . sodium chloride   Intravenous Once  . sodium chloride   Intravenous Once  . Chlorhexidine Gluconate Cloth  6 each Topical Daily  . insulin aspart  0-9 Units Subcutaneous Q4H  . insulin glargine  30 Units Subcutaneous Daily  . sodium chloride flush  3 mL Intravenous Q12H   Continuous Infusions: . lactated ringers 1,000 mL (04/25/19 0829)  . pantoprozole (PROTONIX) infusion Stopped (04/26/19 1335)     LOS: 2 days    Time spent: 26 minutes.     Hosie Poisson, MD [facility] Pager [phone]   If 7PM-7AM, please contact night-coverage [email or web address] Password [email or web address] 04/26/2019, 3:24 PM

## 2019-04-26 NOTE — Anesthesia Postprocedure Evaluation (Signed)
Anesthesia Post Note  Patient: David Lane  Procedure(s) Performed: COLONOSCOPY WITH PROPOFOL (N/A ) BIOPSY POLYPECTOMY HOT HEMOSTASIS (ARGON PLASMA COAGULATION/BICAP) (N/A ) HEMOSTASIS CLIP PLACEMENT     Patient location during evaluation: PACU Anesthesia Type: MAC Level of consciousness: awake and alert Pain management: pain level controlled Vital Signs Assessment: post-procedure vital signs reviewed and stable Respiratory status: spontaneous breathing, nonlabored ventilation, respiratory function stable and patient connected to nasal cannula oxygen Cardiovascular status: stable and blood pressure returned to baseline Postop Assessment: no apparent nausea or vomiting Anesthetic complications: no    Last Vitals:  Vitals:   04/26/19 1320 04/26/19 1400  BP: (!) 111/45 (!) 141/72  Pulse: 79 83  Resp: (!) 21 18  Temp:  36.6 C  SpO2: 99% 100%    Last Pain:  Vitals:   04/26/19 1400  TempSrc: Oral  PainSc: 0-No pain                 Ryan P Ellender

## 2019-04-26 NOTE — Op Note (Signed)
[facility] Patient Name: David Lane Procedure Date: 04/26/2019 MRN: [id number] Attending MD: Justice Britain , MD Date of Birth: 06/04/1946 CSN: [id number] Age: 73 Admit Type: Inpatient Procedure:                Colonoscopy Indications:              Evaluation of unexplained GI bleeding presenting                            with Melena (upper GI source has been excluded),                            Acute post hemorrhagic anemia Providers:                Justice Britain, MD, Jeanella Cara, RN,                            Josie Dixon, RN, Ladona Ridgel, Technician Referring MD:             [facility] Medicines:                Monitored Anesthesia Care Complications:            No immediate complications. Estimated Blood Loss:     Estimated blood loss was minimal. Procedure:                Pre-Anesthesia Assessment:                           - Prior to the procedure, a History and Physical                            was performed, and patient medications and                            allergies were reviewed. The patient's tolerance of                            previous anesthesia was also reviewed. The risks                            and benefits of the procedure and the sedation                            options and risks were discussed with the patient.                            All questions were answered, and informed consent                            was obtained. Prior Anticoagulants: The patient has                            taken antiplatelet medication, last dose was 4 days  prior to procedure. ASA Grade Assessment: III - A                            patient with severe systemic disease. After                            reviewing the risks and benefits, the patient was                            deemed in satisfactory condition to undergo the                            procedure.         After obtaining informed consent, the colonoscope                            was passed under direct vision. Throughout the                            procedure, the patient's blood pressure, pulse, and                            oxygen saturations were monitored continuously. The                            PCF-H190DL ([id number] Olympus pediatric colonscope                            was introduced through the anus and advanced to the                            10 cm into the ileum. The colonoscopy was unusually                            difficult due to a redundant colon, significant                            looping and the patient's body habitus. Successful                            completion of the procedure was aided by changing                            the patient's position, using manual pressure,                            withdrawing and reinserting the scope,                            straightening and shortening the scope to obtain                            bowel loop reduction and using scope torsion. Scope In: 11:26:38 AM Scope Out:  12:56:52 PM Scope Withdrawal Time: 1 hour 19 minutes 10 seconds  Total Procedure Duration: 1 hour 30 minutes 14 seconds  Findings:      Hemorrhoids were found on perianal exam.      A patchy area of mucosa in the terminal ileum was mildly erythematous.       This was biopsied with a cold forceps for histology.      The ileocecal valve appeared normal.      A single medium-sized angioectasia with typical arborization was found       in the proximal ascending colon. Fulguration to ablate the lesion to       prevent bleeding by argon plasma was successful. To prevent bleeding       post-intervention, two hemostatic clips were successfully placed (MR       conditional). There was no bleeding during, or at the end, of the       procedure.      Six sessile polyps were found in the sigmoid colon (2), descending colon       (1), transverse  colon (1) and ascending colon (2). The polyps were 3 to       12 mm in size. These polyps were removed with a cold snare completely.       Resection and retrieval were complete. To prevent bleeding after the       polypectomy, six hemostatic clips were successfully placed (MR       conditional) - 2 on 1 AC polyp, 2 on 1 AC polyp, 2 on 1 Grady polyp. There       was no bleeding at the end of the procedure.      A patchy area of mildly erythematous mucosa was found in the transverse       colon and at the hepatic flexure. This was biopsied with a cold forceps       for histology.      Normal mucosa was found in the entire colon otherwise.      Non-bleeding non-thrombosed external and internal hemorrhoids were found       during retroflexion, during perianal exam and during digital exam. The       hemorrhoids were Grade II (internal hemorrhoids that prolapse but reduce       spontaneously). Impression:               - Hemorrhoids found on perianal exam.                           - Erythematous mucosa in the terminal ileum.                            Biopsied.                           - A single colonic angioectasia. Treated with argon                            plasma coagulation (APC). Clips (MR conditional)                            were placed.                           -  Six 3 to 12 mm polyps in the sigmoid colon, in                            the descending colon, in the transverse colon and                            in the ascending colon, removed with a cold snare.                            Resected and retrieved. Clips (MR conditional) were                            placed on some of the polyps.                           - Erythematous mucosa in the transverse colon and                            at the hepatic flexure. Biopsied.                           - Normal mucosa in the entire examined colon                            otherwise.                           - Non-bleeding  non-thrombosed external and internal                            hemorrhoids. Moderate Sedation:      Not Applicable - Patient had care per Anesthesia. Recommendation:           - The patient will be observed post-procedure,                            until all discharge criteria are met.                           - Return patient to hospital ward for ongoing care.                           - Would recommend holding [place] for at least 72                            hours, but ideally 120 hours (5-days) before                            restarting.                           - At this point in time, the patient no longer is  manifesting gross blood loss with yellow stool                            found throughout the colon and within the TI. I                            would hold on VCE at this point in time. The longer                            we are able to keep patient off his Brillanta we                            may allow healing of the previously noted GU and                            also decrease his risk of post-polyptomy as well as                            post-thermal therapy bleeding. If patient has                            significant return of anemia, then may consider                            role of VCE.                           - IV Iron x 2 dose should be administered while in                            house.                           - Await pathology results.                           - Follow up colonoscopy to be determined in likely                            3-years but also based on his comorbidities at the                            time.                           - The findings and recommendations were discussed                            with the patient.                           - The findings and recommendations were discussed  with the referring physician. Procedure Code(s):        ---  Professional ---                           [phone], 18, Colonoscopy, flexible; with control of                            bleeding, any method                           45385, Colonoscopy, flexible; with removal of                            tumor(s), polyp(s), or other lesion(s) by snare                            technique                           45380, 63, Colonoscopy, flexible; with biopsy,                            single or multiple Diagnosis Code(s):        --- Professional ---                           K64.1, Second degree hemorrhoids                           K63.89, Other specified diseases of intestine                           K55.20, Angiodysplasia of colon without hemorrhage                           K63.5, Polyp of colon                           K92.1, Melena (includes Hematochezia)                           D62, Acute posthemorrhagic anemia CPT copyright 2019 American Medical Association. All rights reserved. The codes documented in this report are preliminary and upon coder review may  be revised to meet current compliance requirements. Justice Britain, MD 04/26/2019 1:17:43 PM Number of Addenda: 0

## 2019-04-26 NOTE — Anesthesia Procedure Notes (Signed)
Date/Time: 04/26/2019 11:12 AM Performed by: Glory Buff, CRNA Oxygen Delivery Method: Simple face mask

## 2019-04-26 NOTE — Interval H&P Note (Signed)
History and Physical Interval Note:  04/26/2019 10:28 AM  David Lane  has presented today for surgery, with the diagnosis of GI bleed.  The various methods of treatment have been discussed with the patient and family. After consideration of risks, benefits and other options for treatment, the patient has consented to  Procedure(s): COLONOSCOPY WITH PROPOFOL (N/A) GIVENS CAPSULE STUDY (N/A) as a surgical intervention.  The patient's history has been reviewed, patient examined, no change in status, stable for surgery.  I have reviewed the patient's chart and labs.  Questions were answered to the patient's satisfaction.    The risks and benefits of endoscopic evaluation were discussed with the patient; these include but are not limited to the risk of perforation, infection, bleeding, missed lesions, lack of diagnosis, severe illness requiring hospitalization, as well as anesthesia and sedation related illnesses.  The patient is agreeable to proceed.   The possibility of a video capsule endoscopy is present as well depending on findings.  He understands the risks of this as well including capsule retention.  We will decide after completion of colonoscopy to the role of capsule endoscopy or not.   [employer]

## 2019-04-26 NOTE — Transfer of Care (Signed)
Immediate Anesthesia Transfer of Care Note  Patient: MARIANO DOSHI  Procedure(s) Performed: COLONOSCOPY WITH PROPOFOL (N/A ) BIOPSY POLYPECTOMY HOT HEMOSTASIS (ARGON PLASMA COAGULATION/BICAP) (N/A ) HEMOSTASIS CLIP PLACEMENT  Patient Location: PACU  Anesthesia Type:MAC  Level of Consciousness: drowsy, patient cooperative and responds to stimulation  Airway & Oxygen Therapy: Patient Spontanous Breathing and Patient connected to face mask oxygen  Post-op Assessment: Report given to RN and Post -op Vital signs reviewed and stable  Post vital signs: Reviewed and stable  Last Vitals:  Vitals Value Taken Time  BP    Temp    Pulse    Resp    SpO2      Last Pain:  Vitals:   04/26/19 0956  TempSrc: Oral  PainSc: 0-No pain         Complications: No apparent anesthesia complications

## 2019-04-26 NOTE — Anesthesia Preprocedure Evaluation (Addendum)
Anesthesia Evaluation  Patient identified by MRN, date of birth, ID band Patient awake    Reviewed: Allergy & Precautions, NPO status , Patient's Chart, lab work & pertinent test results  Airway Mallampati: II  TM Distance: >3 FB Neck ROM: Full    Dental  (+) Upper Dentures, Partial Lower   Pulmonary neg pulmonary ROS,    Pulmonary exam normal breath sounds clear to auscultation       Cardiovascular hypertension, Pt. on home beta blockers + CAD, + Past MI, + Cardiac Stents and +CHF  Normal cardiovascular exam Rhythm:Regular Rate:Normal  ECG: SR, rate 95  ECHO: LV EF: 35% -   40%  CATH: 1. 3 vessel obstructive CAD.  100% mid LAD in stent thrombosis. This is the culprit lesion 80% focal stenosis in stent in the PDA with 75% focal stenosis distal to the stent.  95% small nondominant RCA 2. Mild LV dysfunction with apical wall motion abnormality. Overall EF 45-50% 3. Normal LVEDP 4. Successful PCI of the mid LAD with DES.   Sees cardiologist (Dr. Loletha Grayer)   Neuro/Psych Anxiety negative neurological ROS     GI/Hepatic Neg liver ROS, GERD  Medicated and Controlled,  Endo/Other  diabetes, Insulin Dependent  Renal/GU negative Renal ROS     Musculoskeletal negative musculoskeletal ROS (+)   Abdominal   Peds  Hematology  (+) anemia , HLD   Anesthesia Other Findings GI bleed  Reproductive/Obstetrics                            Anesthesia Physical Anesthesia Plan  ASA: III  Anesthesia Plan: MAC   Post-op Pain Management:    Induction: Intravenous  PONV Risk Score and Plan: 1 and Propofol infusion and Treatment may vary due to age or medical condition  Airway Management Planned: Simple Face Mask  Additional Equipment:   Intra-op Plan:   Post-operative Plan:   Informed Consent: I have reviewed the patients History and Physical, chart, labs and discussed the procedure including the  risks, benefits and alternatives for the proposed anesthesia with the patient or authorized representative who has indicated his/her understanding and acceptance.     Dental advisory given  Plan Discussed with: CRNA  Anesthesia Plan Comments:        Anesthesia Quick Evaluation

## 2019-04-27 ENCOUNTER — Other Ambulatory Visit: Payer: Self-pay

## 2019-04-27 ENCOUNTER — Encounter: Payer: Self-pay | Admitting: Gastroenterology

## 2019-04-27 ENCOUNTER — Encounter (HOSPITAL_COMMUNITY): Payer: Self-pay | Admitting: Gastroenterology

## 2019-04-27 ENCOUNTER — Telehealth: Payer: Self-pay

## 2019-04-27 DIAGNOSIS — Z860100 Personal history of colon polyps, unspecified: Secondary | ICD-10-CM

## 2019-04-27 DIAGNOSIS — K5520 Angiodysplasia of colon without hemorrhage: Secondary | ICD-10-CM

## 2019-04-27 DIAGNOSIS — K922 Gastrointestinal hemorrhage, unspecified: Secondary | ICD-10-CM

## 2019-04-27 LAB — BASIC METABOLIC PANEL
Anion gap: 6 (ref 5–15)
BUN: 13 mg/dL (ref 8–23)
CO2: 27 mmol/L (ref 22–32)
Calcium: 8.4 mg/dL — ABNORMAL LOW (ref 8.9–10.3)
Chloride: 109 mmol/L (ref 98–111)
Creatinine, Ser: 0.92 mg/dL (ref 0.61–1.24)
GFR calc Af Amer: >60 mL/min (ref >60)
GFR calc non Af Amer: >60 mL/min (ref >60)
Glucose, Bld: 157 mg/dL — ABNORMAL HIGH (ref 70–99)
Potassium: 4.2 mmol/L (ref 3.5–5.1)
Sodium: 142 mmol/L (ref 135–145)

## 2019-04-27 LAB — CBC
HCT: 25.2 % — ABNORMAL LOW (ref 39.0–52.0)
Hemoglobin: 8.1 g/dL — ABNORMAL LOW (ref 13.0–17.0)
MCH: 30.3 pg (ref 26.0–34.0)
MCHC: 32.1 g/dL (ref 30.0–36.0)
MCV: 94.4 fL (ref 80.0–100.0)
Platelets: 174 10*3/uL (ref 150–400)
RBC: 2.67 MIL/uL — ABNORMAL LOW (ref 4.22–5.81)
RDW: 13.8 % (ref 11.5–15.5)
WBC: 6.2 10*3/uL (ref 4.0–10.5)
nRBC: 0 % (ref 0.0–0.2)

## 2019-04-27 LAB — GLUCOSE, CAPILLARY
Glucose-Capillary: 140 mg/dL — ABNORMAL HIGH (ref 70–99)
Glucose-Capillary: 148 mg/dL — ABNORMAL HIGH (ref 70–99)
Glucose-Capillary: 244 mg/dL — ABNORMAL HIGH (ref 70–99)
Glucose-Capillary: 273 mg/dL — ABNORMAL HIGH (ref 70–99)

## 2019-04-27 MED ORDER — ASPIRIN 81 MG PO TABS: 81.0000 mg | ORAL_TABLET | Freq: Every day | ORAL | Status: AC

## 2019-04-27 MED ORDER — TICAGRELOR 90 MG PO TABS: 90.0000 mg | ORAL_TABLET | Freq: Two times a day (BID) | ORAL | 4 refills | Status: DC

## 2019-04-27 MED ORDER — SODIUM CHLORIDE 0.9 % IV SOLN
510.0000 mg | INTRAVENOUS | Status: DC
  Administered 2019-04-27: 510 mg via INTRAVENOUS
  Filled 2019-04-27: qty 17

## 2019-04-27 MED ORDER — PANTOPRAZOLE SODIUM 40 MG PO TBEC: 40.0000 mg | DELAYED_RELEASE_TABLET | Freq: Two times a day (BID) | ORAL | 0 refills | Status: DC

## 2019-04-27 MED ORDER — SODIUM CHLORIDE 0.9 % IV SOLN
510.0000 mg | Freq: Once | INTRAVENOUS | Status: DC
  Filled 2019-04-27: qty 17

## 2019-04-27 NOTE — Telephone Encounter (Signed)
Lab orders entered for the first CBC. Appointment is scheduled for 05/29/19 at 8:30 am. Call patient 04/30/19

## 2019-04-27 NOTE — Plan of Care (Signed)

## 2019-04-27 NOTE — Progress Notes (Addendum)
Patient ID: David Lane, male   DOB: Mar 29, 1946, 73 y.o.   MRN: [id number]    Progress Note   Subjective    Day # 4  HGb 8.1 - stable  Colonoscopy yesterday -area of patchy erythema in the terminal ileum, biopsied, there was one medium sized AVM in the ascending colon treated with APC and endoclips.  6 sessile polyps were removed.  Polyps were 3 to 12 mm in size, 6 endoclips placed in total  Patient says he feels fine, no dizziness or lightheadedness with ambulation.  No further black stools and has been eating without difficulty  He had multiple questions- all of which were addressed.     Objective   Vital signs in last 24 hours: Temp:  [97.6 F (36.4 C)-98 F (36.7 C)] 97.8 F (36.6 C) (07/31 0358) Pulse Rate:  [71-83] 71 (07/31 0358) Resp:  [16-21] 16 (07/31 0358) BP: (102-152)/(45-80) 102/56 (07/31 0358) SpO2:  [99 %-100 %] 100 % (07/31 0358) Weight:  [83.3 kg] 83.3 kg (07/31 0500) Last BM Date: 04/26/19 General:  older  white male in NAD, talkative and anxious Heart:  Regular rate and rhythm; no murmurs Lungs: Respirations even and unlabored, lungs CTA bilaterally Abdomen:  Soft, nontender and nondistended. Normal bowel sounds. Extremities:  Without edema. Neurologic:  Alert and oriented,  grossly normal neurologically. Psych:  Cooperative. Normal mood and affect.  Intake/Output from previous day: 07/30 0701 - 07/31 0700 In: 794.3 [I.V.:794.3] Out: 500 [Urine:500] Intake/Output this shift: No intake/output data recorded.  Lab Results: Recent Labs    04/26/19 0525 04/26/19 1858 04/27/19 0514  WBC  --   --  6.2  HGB 8.1* 7.9* 8.1*  HCT 25.0* 23.7* 25.2*  PLT  --   --  174   BMET Recent Labs    04/25/19 1502 04/27/19 0514  NA 135 142  K 4.2 4.2  CL 104 109  CO2 27 27  GLUCOSE 260* 157*  BUN 30* 13  CREATININE 1.01 0.92  CALCIUM 7.9* 8.4*   LFT No results for input(s): PROT, ALBUMIN, AST, ALT, ALKPHOS, BILITOT, BILIDIR, IBILI in the last  72 hours. PT/INR No results for input(s): LABPROT, INR in the last 72 hours.     Assessment / Plan:    #21 73 year old white male admitted 4 days ago with acute GI bleed with coffee-ground emesis and melena in setting of chronic Brilinta and aspirin  Initial EGD 7/28 with retained food and fluid no active bleeding noted Repeat EGD 04/25/2019 with finding of an 8 mm gastric ulcer clean-based. H. pylori IgG negative  Colonoscopy 04/26/2019-one nonbleeding a scending colon AVM, treated with APC and then Endo Clip, total of 6 polyps removed, and several endoclips placed to help prevent post polypectomy bleeding in setting of recent anticoagulation. Terminal ileal biopsy of area of erythema pending  Patient has been stable over the past 36 hours without any active bleeding.  Initial bleed may have been secondary to the gastric ulcer.  Recommendation is to leave patient off Brilinta for 4 more days, so restart on Tuesday, 05/01/2019, restart aspirin 05/01/2019  Will need to be discharged home on twice daily Protonix x8 weeks then decrease to every morning long-term  Will arrange for follow-up hemoglobinoffice next week, patient prefers to have blood work done at his new primary care physician/[facility] care.   He will need serial hemoglobins over the next several weeks, and will schedule for follow-up office visit with Dr. Silverio Decamp  in 2  to 3 weeks.  Our office will call him next week with appointment  Patient has been advised to return to the hospital should he develop any evidence of recurrent GI bleeding. Also advised to avoid any Pepto-Bismol check confused him previously with onset of bleeding  #2  Anemia secondary to acute GI blood loss-stable post transfusions He has also received 2 doses of IV Feraheme  #3 coronary artery disease status post multiple stents #4 ischemic cardiomyopathy EF 50% #5.  History of chronic pancreatitis and pancreatic insufficiency #6  insulin-dependent diabetes mellitus   Principal Problem:   Acute upper GI bleed Active Problems:   Essential hypertension   CAD, 07/14/12- LAD/PDA DES   Ischemic cardiomyopathy, EF 50% by echo 08/01/13   Pancreatic insufficiency   Type 1 diabetes mellitus with complications ([place])   Duodenitis   Gastroesophageal reflux disease with esophagitis     LOS: 3 days   David Robin EsterwoodPA-C  04/27/2019, 12:00 PM

## 2019-04-27 NOTE — Telephone Encounter (Signed)
-----   Message from Alfredia Ferguson, PA-C sent at 04/27/2019 12:42 PM EDT ----- Regarding: follow up labs and office visit Beth, this patient has been hospitalized with an acute upper GI bleed.  Gastric ulcer and anemia in setting of chronic anticoagulation.  Also had colonoscopy with multiple polyps removed and clipping of an AVM He needs a CBC done next Tuesday or Wednesday if you could please place the order, he may have this drawn at [facility] primary care/[facility]. You will also need follow-up CBC 2 weeks later  Please schedule follow-up office visit with Dr. Silverio Decamp  in 3 weeks.  If she does not have any availability in August, I can see in follow-up.  I told the patient we would call on Monday with appointment time  Thanks

## 2019-04-27 NOTE — Plan of Care (Signed)
  Problem: Education: Goal: Knowledge of General Education information will improve Description: Including pain rating scale, medication(s)/side effects and non-pharmacologic comfort measures 04/27/2019 1552 by [name] Gaul, RN Outcome: Completed/Met 04/27/2019 0825 by [name] Gaul, RN Outcome: Progressing   Problem: Health Behavior/Discharge Planning: Goal: Ability to manage health-related needs will improve 04/27/2019 1552 by [name] Gaul, RN Outcome: Completed/Met 04/27/2019 0825 by [name] Gaul, RN Outcome: Progressing   Problem: Clinical Measurements: Goal: Ability to maintain clinical measurements within normal limits will improve 04/27/2019 1552 by [name] Gaul, RN Outcome: Completed/Met 04/27/2019 0825 by [name] Gaul, RN Outcome: Progressing Goal: Will remain free from infection 04/27/2019 1552 by [name] Gaul, RN Outcome: Completed/Met 04/27/2019 0825 by [name] Gaul, RN Outcome: Progressing Goal: Diagnostic test results will improve 04/27/2019 1552 by [name] Gaul, RN Outcome: Completed/Met 04/27/2019 0825 by [name] Gaul, RN Outcome: Progressing Goal: Respiratory complications will improve 04/27/2019 1552 by [name] Gaul, RN Outcome: Completed/Met 04/27/2019 0825 by [name] Gaul, RN Outcome: Progressing Goal: Cardiovascular complication will be avoided 04/27/2019 1552 by [name] Gaul, RN Outcome: Completed/Met 04/27/2019 0825 by [name] Gaul, RN Outcome: Progressing   Problem: Activity: Goal: Risk for activity intolerance will decrease 04/27/2019 1552 by [name] Gaul, RN Outcome: Completed/Met 04/27/2019 0825 by [name] Gaul, RN Outcome: Progressing   Problem: Nutrition: Goal: Adequate nutrition will be maintained 04/27/2019 1552 by [name] Gaul, RN Outcome: Completed/Met 04/27/2019 0825 by [name] Gaul, RN Outcome: Progressing   Problem: Coping: Goal: Level of anxiety will decrease 04/27/2019 1552 by [name] Gaul, RN Outcome: Completed/Met 04/27/2019 0825 by [name] Gaul, RN Outcome: Progressing   Problem: Elimination: Goal: Will not experience complications related to bowel motility 04/27/2019 1552 by [name] Gaul, RN Outcome: Completed/Met 04/27/2019 0825 by [name] Gaul, RN Outcome: Progressing Goal: Will not experience complications related to urinary retention 04/27/2019 1552 by [name] Gaul, RN Outcome: Completed/Met 04/27/2019 0825 by [name] Gaul, RN Outcome: Progressing   Problem: Pain Managment: Goal: General experience of comfort will improve 04/27/2019 1552 by [name] Gaul, RN Outcome: Completed/Met 04/27/2019 0825 by [name] Gaul, RN Outcome: Progressing   Problem: Safety: Goal: Ability to remain free from injury will improve 04/27/2019 1552 by [name] Gaul, RN Outcome: Completed/Met 04/27/2019 0825 by [name] Gaul, RN Outcome: Progressing   Problem: Skin Integrity: Goal: Risk for impaired skin integrity will decrease 04/27/2019 1552 by [name] Gaul, RN Outcome: Completed/Met 04/27/2019 0825 by [name] Gaul, RN Outcome: Progressing

## 2019-04-27 NOTE — Progress Notes (Signed)
   Advised Dr. Sallyanne Kuster that David Lane was currently hospitalized with a GI bleed and his aspirin/Brilinta are on hold.  Dr. Sallyanne Kuster advised that since his last PCI was March 2019, it was okay to discontinue the Brilinta.  Restart the aspirin when okay with GI.  Rosaria Ferries, PA-C 04/27/2019 4:31 PM Beeper [phone]

## 2019-04-27 NOTE — Care Management Important Message (Signed)
Important Message  Patient Details IM Letter given to [name] SW to present to the Patient Name: David Lane MRN: [id number] Date of Birth: [birth year]   Medicare Important Message Given:  Yes     Kerin Salen 04/27/2019, 11:35 AM

## 2019-04-29 NOTE — Discharge Summary (Signed)
Physician Discharge Summary  ULYSEES ROBARTS CNO:[id number] DOB: 1946-06-01 DOA: 04/23/2019  PCP: Luetta Nutting, DO  Admit date: 04/23/2019 Discharge date: 04/29/2019  Admitted From:Home.  Disposition:  Home.   Recommendations for Outpatient Follow-up:  1. Follow up with PCP in 1-2 weeks 2. Please obtain BMP/CBC in one week Please follow up with gastroenterology as recommended.  We have stopped Brillinta as per your cardiology recommendations.  You can restart the aspirin on 4 th of august.    Discharge Condition:stable.  CODE STATUS:full code.  Diet recommendation: Heart Healthy  Brief/Interim Summary: Demichael Traum Riddleis a 73 y.o.malewith medical history significant forcoronary artery disease, chronic combined systolic and diastolic CHF, pancreatic insufficiency, insulin-dependent diabetes mellitus, and hypertension, now presenting to the emergency department for evaluation of melena, dark vomitus, lightheadedness, and elevated blood sugars. GI consulted and he underwent EGD and colonoscopy.    Discharge Diagnoses:  Principal Problem:   Acute upper GI bleed Active Problems:   Essential hypertension   CAD, 07/14/12- LAD/PDA DES   Ischemic cardiomyopathy, EF 50% by echo 08/01/13   Pancreatic insufficiency   Type 1 diabetes mellitus with complications ([place])   Duodenitis   Gastroesophageal reflux disease with esophagitis   AVM (arteriovenous malformation) of colon   History of colonic polyps  Acute GI bleed:  S/p EGD showing the following.  No gross lesions in promixal/middle esophagus. LA Grade B esophagitis distally.  Multiple gastric polyps (likely fundic). - Non-bleeding gastric ulcer with a clean ulcer base (Forrest Class III). - Nodular mucosa in the duodenal bulb (likely Bruenner's hyperplasia). - Duodenal angulation deformity at the sweep.  Continue with PPI BID,  Pt underwent colonoscopy, reviewed the results of the colonoscopy.  Advance diet as per GI  recommendations.   Plan to restart the aspirin on 8/4 as per GI recommendations.  Continue with PPI BID for 8 weeks followed by daily.    Hypertension  Well controlled.    Acute anemia of blood loss:  Transfuse to keep hemoglobin greater than 7. Currently at 8.1.   H/o of CAD , with s/p PCI TO LAD, and ischemic cardiomyopathy:   no chest pain or sob.  Dr C suggested we can stop the Brillinta as its been more than a year since his last PCI.       Type 1 DM  Resume home regimen on discharge.   History of chronic combined systolic and diastolic heart failure Patient appears to be overall euvolemic at this time. No sob or chest pain.    Discharge Instructions  Discharge Instructions    Diet - low sodium heart healthy   Complete by: As directed    Discharge instructions   Complete by: As directed    Please follow up with gastroenterology as recommended.  Please hold aspirin and Brillinta as recommended by gastroenterology till 04/30/2019 and you can start taking it on 05/01/2019. Please follow up with cardiology as scheduled.     Allergies as of 04/27/2019      Reactions   Lipitor [atorvastatin] Other (See Comments)   Muscle weakness   Metformin And Related Diarrhea   Severe diarrhea      Medication List    STOP taking these medications   ticagrelor 90 MG Tabs tablet Commonly known as: BRILINTA     TAKE these medications   aspirin 81 MG tablet Take 1 tablet (81 mg total) by mouth daily. Start taking on: May 01, 2019 What changed: These instructions start on May 01, 2019. If you  are unsure what to do until then, ask your doctor or other care provider.   B-D UF III MINI PEN NEEDLES 31G X 5 MM Misc Generic drug: Insulin Pen Needle use to inject insulin daily   carvedilol 6.25 MG tablet Commonly known as: COREG Take 6.25 mg by mouth 2 (two) times daily with a meal.   CENTRUM SILVER ULTRA MENS PO Take 1 tablet by mouth daily.   escitalopram 5 MG  tablet Commonly known as: Lexapro Take 1 tablet (5 mg total) by mouth daily.   glucose blood test strip Commonly known as: ONE TOUCH ULTRA TEST USE 1 STRIP TO CHECK GLUCOSE 4 TIMES DAILY; K86.89, E10.8   Lantus SoloStar 100 UNIT/ML Solostar Pen Generic drug: Insulin Glargine INJECT 65 UNITS UNDER THE SKIN EVERY MORNING What changed: See the new instructions.   NovoLOG FlexPen 100 UNIT/ML FlexPen Generic drug: insulin aspart Inject 2 Units into the skin 3 (three) times daily with meals. Take only if your blood sugar is over 400. What changed:   how much to take  when to take this  reasons to take this   Pancrelipase (Lip-Prot-Amyl) 24000-76000 units Cpep Commonly known as: Creon TAKE 2 CAPSULES WITH EACH MEAL AND 1 CAPSULE WITH SMALL SNACKS (10 CAPS PER DAY) What changed:   how much to take  how to take this  when to take this  reasons to take this  additional instructions   pantoprazole 40 MG tablet Commonly known as: PROTONIX Take 1 tablet (40 mg total) by mouth 2 (two) times daily before a meal. What changed: when to take this   rosuvastatin 40 MG tablet Commonly known as: CRESTOR TAKE 1 TABLET(40 MG) BY MOUTH AT BEDTIME What changed: See the new instructions.      Follow-up Information    Luetta Nutting, DO. Schedule an appointment as soon as possible for a visit in 1 week(s).   Specialty: Family Medicine Contact information: [address] [address] [phone]        [name], MD .   Specialty: Cardiology Contact information: [address] [place] [address] [place] [address] [phone]          Allergies  Allergen Reactions  . Lipitor [Atorvastatin] Other (See Comments)    Muscle weakness  . Metformin And Related Diarrhea    Severe diarrhea    Consultations:  Gastroenterology.    Procedures/Studies: Ct Abdomen Pelvis W Contrast  Result Date: 04/24/2019 CLINICAL DATA:  Nausea, vomiting with  loose dark stools and coffee ground emesis. Prior history of pancreatitis. EXAM: CT ABDOMEN AND PELVIS WITH CONTRAST TECHNIQUE: Multidetector CT imaging of the abdomen and pelvis was performed using the standard protocol following bolus administration of intravenous contrast. CONTRAST:  127mL OMNIPAQUE IOHEXOL 300 MG/ML  SOLN COMPARISON:  MRI abdomen from 2012 FINDINGS: Lower chest: The lung bases are clear of acute process. No pleural effusion or pulmonary lesions. The heart is normal in size. No pericardial effusion. The distal esophagus and aorta are unremarkable. Hepatobiliary: 3.5 cm lobulated segment 8 liver lesion consistent with known hepatic hemangioma. Typical nodular peripheral enhancement and unchanged since 2012. Pancreas: Marked pancreatic atrophy and diffuse calcifications consistent with chronic calcific pancreatitis. No findings for acute inflammation and no mass. No worrisome hepatic lesions or intrahepatic biliary dilatation. The gallbladder is unremarkable. No common bile duct dilatation. Spleen: Normal size.  No focal lesions. Adrenals/Urinary Tract: The adrenal glands and kidneys are unremarkable. No renal masses or hydronephrosis. The bladder is unremarkable. Stomach/Bowel:  The stomach, duodenum, small bowel and colon are unremarkable. No acute inflammatory changes, mass lesions or obstructive findings. Vascular/Lymphatic: The aorta and branch vessels are patent. Scattered atherosclerotic calcifications. The major venous structures are patent. No mesenteric or retroperitoneal mass or adenopathy. Reproductive: Mild prostate gland enlargement. Other: No pelvic mass or adenopathy. No free pelvic fluid collections. No inguinal mass or adenopathy. No abdominal wall hernia or subcutaneous lesions. Musculoskeletal: No significant bony findings. IMPRESSION: 1. No acute abdominal/pelvic findings, mass lesions or adenopathy. 2. Stable right hepatic lobe hemangioma. 3. Changes of chronic calcific  pancreatitis but no definite findings for acute inflammation. 4. Three-vessel coronary artery calcifications. Electronically Signed   By: Marijo Sanes M.D.   On: 04/24/2019 15:15   Dg Chest Port 1 View  Result Date: 04/23/2019 CLINICAL DATA:  Weakness, GI bleed EXAM: PORTABLE CHEST 1 VIEW COMPARISON:  01/12/2018 FINDINGS: The heart size and mediastinal contours are within normal limits. Left coronary stent. Both lungs are clear. The visualized skeletal structures are unremarkable. IMPRESSION: No active disease. Electronically Signed   By: Franchot Gallo M.D.   On: 04/23/2019 20:47       Subjective: No chest pain or sob.   Discharge Exam: Vitals:   04/27/19 1306 04/27/19 1450  BP: (!) 150/86 140/80  Pulse: 86 79  Resp: 19   Temp: 98.5 F (36.9 C)   SpO2: 100%    Vitals:   04/27/19 0358 04/27/19 0500 04/27/19 1306 04/27/19 1450  BP: (!) 102/56  (!) 150/86 140/80  Pulse: 71  86 79  Resp: 16  19   Temp: 97.8 F (36.6 C)  98.5 F (36.9 C)   TempSrc: Oral  Oral   SpO2: 100%  100%   Weight:  83.3 kg    Height:        General: Pt is alert, awake, not in acute distress Cardiovascular: RRR, S1/S2 +, no rubs, no gallops Respiratory: CTA bilaterally, no wheezing, no rhonchi Abdominal: Soft, NT, ND, bowel sounds + Extremities: no edema, no cyanosis    The results of significant diagnostics from this hospitalization (including imaging, microbiology, ancillary and laboratory) are listed below for reference.     Microbiology: Recent Results (from the past 240 hour(s))  SARS Coronavirus 2 (CEPHEID - Performed in [place] hospital lab), Hosp Order     Status: None   Collection Time: 04/23/19  8:10 PM   Specimen: Nasopharyngeal Swab  Result Value Ref Range Status   SARS Coronavirus 2 NEGATIVE NEGATIVE Final    Comment: (NOTE) If result is NEGATIVE SARS-CoV-2 target nucleic acids are NOT DETECTED. The SARS-CoV-2 RNA is generally detectable in upper and lower  respiratory  specimens during the acute phase of infection. The lowest  concentration of SARS-CoV-2 viral copies this assay can detect is 250  copies / mL. A negative result does not preclude SARS-CoV-2 infection  and should not be used as the sole basis for treatment or other  patient management decisions.  A negative result may occur with  improper specimen collection / handling, submission of specimen other  than nasopharyngeal swab, presence of viral mutation(s) within the  areas targeted by this assay, and inadequate number of viral copies  (<250 copies / mL). A negative result must be combined with clinical  observations, patient history, and epidemiological information. If result is POSITIVE SARS-CoV-2 target nucleic acids are DETECTED. The SARS-CoV-2 RNA is generally detectable in upper and lower  respiratory specimens dur ing the acute phase of infection.  Positive  results are indicative of active infection with SARS-CoV-2.  Clinical  correlation with patient history and other diagnostic information is  necessary to determine patient infection status.  Positive results do  not rule out bacterial infection or co-infection with other viruses. If result is PRESUMPTIVE POSTIVE SARS-CoV-2 nucleic acids MAY BE PRESENT.   A presumptive positive result was obtained on the submitted specimen  and confirmed on repeat testing.  While 2019 novel coronavirus  (SARS-CoV-2) nucleic acids may be present in the submitted sample  additional confirmatory testing may be necessary for epidemiological  and / or clinical management purposes  to differentiate between  SARS-CoV-2 and other Sarbecovirus currently known to infect humans.  If clinically indicated additional testing with an alternate test  methodology [phone]) is advised. The SARS-CoV-2 RNA is generally  detectable in upper and lower respiratory sp ecimens during the acute  phase of infection. The expected result is Negative. Fact Sheet for  Patients:  StrictlyIdeas.no Fact Sheet for Healthcare Providers: BankingDealers.co.za This test is not yet approved or cleared by the [place] FDA and has been authorized for detection and/or diagnosis of SARS-CoV-2 by FDA under an Emergency Use Authorization (EUA).  This EUA will remain in effect (meaning this test can be used) for the duration of the COVID-19 declaration under Section 564(b)(1) of the Act, 21 U.S.C. section 360bbb-3(b)(1), unless the authorization is terminated or revoked sooner. Performed at [facility], [place] [address], [place], [place] [address]   MRSA PCR Screening     Status: None   Collection Time: 04/24/19  1:45 AM   Specimen: Nasal Mucosa; Nasopharyngeal  Result Value Ref Range Status   MRSA by PCR NEGATIVE NEGATIVE Final    Comment:        The GeneXpert MRSA Assay (FDA approved for NASAL specimens only), is one component of a comprehensive MRSA colonization surveillance program. It is not intended to diagnose MRSA infection nor to guide or monitor treatment for MRSA infections. Performed at [facility], [place] [address], [place], [place] [address]      Labs: BNP (last 3 results) No results for input(s): BNP in the last 8760 hours. Basic Metabolic Panel: Recent Labs  Lab 04/23/19 1829 04/24/19 0203 04/25/19 1502 04/27/19 0514  NA 135 134* 135 142  K 4.8 4.6 4.2 4.2  CL 101 101 104 109  CO2 22 22 27 27   GLUCOSE 432* 481* 260* 157*  BUN 56* 75* 30* 13  CREATININE 1.18 1.39* 1.01 0.92  CALCIUM 8.2* 8.0* 7.9* 8.4*   Liver Function Tests: Recent Labs  Lab 04/23/19 2010 04/24/19 0203  AST 50* 35  ALT 91* 76*  ALKPHOS 61 54  BILITOT 0.9 1.2  PROT 6.2* 5.8*  ALBUMIN 3.3* 3.1*   No results for input(s): LIPASE, AMYLASE in the last 168 hours. No results for input(s): AMMONIA in the last 168 hours. CBC: Recent Labs  Lab 04/23/19 1829   04/25/19 0727 04/25/19 1502 04/26/19 0525 04/26/19 1858 04/27/19 0514  WBC 10.4  --   --   --   --   --  6.2  HGB 10.3*   < > 8.5* 9.1* 8.1* 7.9* 8.1*  HCT 31.2*   < > 24.6* 26.8* 25.0* 23.7* 25.2*  MCV 95.7  --   --   --   --   --  94.4  PLT 215  --   --   --   --   --  174   < > =  values in this interval not displayed.   Cardiac Enzymes: No results for input(s): CKTOTAL, CKMB, CKMBINDEX, TROPONINI in the last 168 hours. BNP: Invalid input(s): POCBNP CBG: Recent Labs  Lab 04/26/19 2006 04/26/19 2356 04/27/19 0355 04/27/19 0740 04/27/19 1148  GLUCAP 220* 244* 148* 140* 273*   D-Dimer No results for input(s): DDIMER in the last 72 hours. Hgb A1c No results for input(s): HGBA1C in the last 72 hours. Lipid Profile No results for input(s): CHOL, HDL, LDLCALC, TRIG, CHOLHDL, LDLDIRECT in the last 72 hours. Thyroid function studies No results for input(s): TSH, T4TOTAL, T3FREE, THYROIDAB in the last 72 hours.  Invalid input(s): FREET3 Anemia work up No results for input(s): VITAMINB12, FOLATE, FERRITIN, TIBC, IRON, RETICCTPCT in the last 72 hours. Urinalysis    Component Value Date/Time   COLORURINE STRAW (A) 04/24/2019 1000   APPEARANCEUR CLEAR 04/24/2019 1000   LABSPEC 1.021 04/24/2019 1000   PHURINE 5.0 04/24/2019 1000   GLUCOSEU >=500 (A) 04/24/2019 1000   GLUCOSEU >=1000 (A) 11/25/2014 1339   HGBUR NEGATIVE 04/24/2019 1000   HGBUR negative 08/03/2010 0919   BILIRUBINUR NEGATIVE 04/24/2019 1000   KETONESUR 20 (A) 04/24/2019 1000   PROTEINUR NEGATIVE 04/24/2019 1000   UROBILINOGEN 0.2 11/25/2014 1339   NITRITE NEGATIVE 04/24/2019 1000   LEUKOCYTESUR NEGATIVE 04/24/2019 1000   Sepsis Labs Invalid input(s): PROCALCITONIN,  WBC,  LACTICIDVEN Microbiology Recent Results (from the past 240 hour(s))  SARS Coronavirus 2 (CEPHEID - Performed in [place] hospital lab), Hosp Order     Status: None   Collection Time: 04/23/19  8:10 PM   Specimen: Nasopharyngeal  Swab  Result Value Ref Range Status   SARS Coronavirus 2 NEGATIVE NEGATIVE Final    Comment: (NOTE) If result is NEGATIVE SARS-CoV-2 target nucleic acids are NOT DETECTED. The SARS-CoV-2 RNA is generally detectable in upper and lower  respiratory specimens during the acute phase of infection. The lowest  concentration of SARS-CoV-2 viral copies this assay can detect is 250  copies / mL. A negative result does not preclude SARS-CoV-2 infection  and should not be used as the sole basis for treatment or other  patient management decisions.  A negative result may occur with  improper specimen collection / handling, submission of specimen other  than nasopharyngeal swab, presence of viral mutation(s) within the  areas targeted by this assay, and inadequate number of viral copies  (<250 copies / mL). A negative result must be combined with clinical  observations, patient history, and epidemiological information. If result is POSITIVE SARS-CoV-2 target nucleic acids are DETECTED. The SARS-CoV-2 RNA is generally detectable in upper and lower  respiratory specimens dur ing the acute phase of infection.  Positive  results are indicative of active infection with SARS-CoV-2.  Clinical  correlation with patient history and other diagnostic information is  necessary to determine patient infection status.  Positive results do  not rule out bacterial infection or co-infection with other viruses. If result is PRESUMPTIVE POSTIVE SARS-CoV-2 nucleic acids MAY BE PRESENT.   A presumptive positive result was obtained on the submitted specimen  and confirmed on repeat testing.  While 2019 novel coronavirus  (SARS-CoV-2) nucleic acids may be present in the submitted sample  additional confirmatory testing may be necessary for epidemiological  and / or clinical management purposes  to differentiate between  SARS-CoV-2 and other Sarbecovirus currently known to infect humans.  If clinically indicated  additional testing with an alternate test  methodology [phone]) is advised. The SARS-CoV-2 RNA is generally  detectable in upper and lower respiratory sp ecimens during the acute  phase of infection. The expected result is Negative. Fact Sheet for Patients:  StrictlyIdeas.no Fact Sheet for Healthcare Providers: BankingDealers.co.za This test is not yet approved or cleared by the [place] FDA and has been authorized for detection and/or diagnosis of SARS-CoV-2 by FDA under an Emergency Use Authorization (EUA).  This EUA will remain in effect (meaning this test can be used) for the duration of the COVID-19 declaration under Section 564(b)(1) of the Act, 21 U.S.C. section 360bbb-3(b)(1), unless the authorization is terminated or revoked sooner. Performed at [facility], [place] [address], [place], [place] [address]   MRSA PCR Screening     Status: None   Collection Time: 04/24/19  1:45 AM   Specimen: Nasal Mucosa; Nasopharyngeal  Result Value Ref Range Status   MRSA by PCR NEGATIVE NEGATIVE Final    Comment:        The GeneXpert MRSA Assay (FDA approved for NASAL specimens only), is one component of a comprehensive MRSA colonization surveillance program. It is not intended to diagnose MRSA infection nor to guide or monitor treatment for MRSA infections. Performed at [facility], [place] [address] [place], [place] [address]      Time coordinating discharge: 32 minutes  SIGNED:   Hosie Poisson, MD  [facility] 04/29/2019, 9:02 AM Pager   If 7PM-7AM, please contact night-coverage [email or web address] Password [email or web address]

## 2019-04-30 ENCOUNTER — Telehealth: Payer: Self-pay | Admitting: Behavioral Health

## 2019-04-30 NOTE — Telephone Encounter (Signed)
Patient contacted. He plans to have labs drawn at PCP. More convenient for him. Office appointment here moved to a date that works better for him. 06/06/19 @2 :10 pm Card mailed.

## 2019-04-30 NOTE — Telephone Encounter (Signed)
Transition Care Management Follow-up Telephone Call  PCP: Luetta Nutting, DO  Admit date: 04/23/2019 Discharge date: 04/29/2019  Admitted From:Home.  Disposition:  Home.   Recommendations for Outpatient Follow-up:  1. Follow up with PCP in 1-2 weeks 2. Please obtain BMP/CBC in one week Please follow up with gastroenterology as recommended.  We have stopped Brillinta as per your cardiology recommendations.  You can restart the aspirin on 4 th of august.    Discharge Condition:stable.    How have you been since you were released from the hospital? Patient stated, "I feel pretty good".   Do you understand why you were in the hospital? yes   Do you understand the discharge instructions? yes   Where were you discharged to? Home   Items Reviewed:  Medications reviewed: yes  Allergies reviewed: yes  Dietary changes reviewed: yes, heart healthy diet  Referrals reviewed: yes, Follow up with PCP in 1-2 weeks; Follow-up with Endocrinology & Cardiology.   Functional Questionnaire:   Activities of Daily Living (ADLs):   He states they are independent in the following: ambulation, bathing and hygiene, feeding, continence, grooming, toileting and dressing States they require assistance with the following: None   Any transportation issues/concerns?: no   Any patient concerns? no   Confirmed importance and date/time of follow-up visits scheduled no, patient voiced that he will give the office a call back to schedule a hospital follow-up appointment.  Confirmed with patient if condition begins to worsen call PCP or go to the ER.  Patient was given the office number and encouraged to call back with question or concerns.  : yes

## 2019-05-01 DIAGNOSIS — H25042 Posterior subcapsular polar age-related cataract, left eye: Secondary | ICD-10-CM | POA: Diagnosis not present

## 2019-05-01 DIAGNOSIS — E113599 Type 2 diabetes mellitus with proliferative diabetic retinopathy without macular edema, unspecified eye: Secondary | ICD-10-CM | POA: Diagnosis not present

## 2019-05-01 DIAGNOSIS — H25811 Combined forms of age-related cataract, right eye: Secondary | ICD-10-CM | POA: Diagnosis not present

## 2019-05-08 ENCOUNTER — Encounter: Payer: Self-pay | Admitting: Family Medicine

## 2019-05-08 ENCOUNTER — Ambulatory Visit (INDEPENDENT_AMBULATORY_CARE_PROVIDER_SITE_OTHER): Payer: Medicare Other | Admitting: Family Medicine

## 2019-05-08 VITALS — BP 160/84 | HR 80 | Temp 98.0°F | Ht 72.0 in | Wt 189.2 lb

## 2019-05-08 DIAGNOSIS — K922 Gastrointestinal hemorrhage, unspecified: Secondary | ICD-10-CM

## 2019-05-08 DIAGNOSIS — K8689 Other specified diseases of pancreas: Secondary | ICD-10-CM

## 2019-05-08 DIAGNOSIS — I10 Essential (primary) hypertension: Secondary | ICD-10-CM | POA: Diagnosis not present

## 2019-05-08 DIAGNOSIS — E089 Diabetes mellitus due to underlying condition without complications: Secondary | ICD-10-CM

## 2019-05-08 DIAGNOSIS — D62 Acute posthemorrhagic anemia: Secondary | ICD-10-CM

## 2019-05-08 NOTE — Assessment & Plan Note (Addendum)
-  Stable at this time.  Discussed he should stay off Brilinta at this time.  He has resumed ASA without and s/s of bleeding.  -Recheck H&H today.  -Has upcoming cataract surgery, I think it is ok to go ahead and proceed with this

## 2019-05-08 NOTE — Progress Notes (Signed)
David Lane - 73 y.o. male MRN [id number]  Date of birth: 06-12-1946  Subjective Chief Complaint  Patient presents with  . Hospitalization Follow-up    pt states feels little bit better but fatigued/ no more dizziness    HPI David Lane is a 73 y.o. male with history of CAD with ischemic cardiomyopathy, HTN, GERD and T2DM here today for hospital follow up.  He was hospitalized 7/27-04/29/2019 after having episodes of melena and dark emesis.  Discharge summary reviewed.  He was symptomatic with dizziness at time of admission.  He underwent endoscopy/colonoscopy with no definitive source of bleed, although he had several gastric polyps and AVM of the colon.  His brilinta was held per cardiology recommendation.  He misunderstood d/c instructions and he has actually restarted Brilinta and ASA.  Today he reports he is doing well.  He has had no further bleeding, dark stool or vomiting.  He denies anginal symptoms.  He does have some fatigue but this is improving.  His dizziness has resolved. H&H stable at discharge and recommend that this be rechecked at today's appt.  He continues to follow with endocrinology for his diabetes, last a1c of 9.1%.  He has f/u with Dr. Loanne Drilling next month.    ROS:  A comprehensive ROS was completed and negative except as noted per HPI  Allergies  Allergen Reactions  . Lipitor [Atorvastatin] Other (See Comments)    Muscle weakness  . Metformin And Related Diarrhea    Severe diarrhea    Past Medical History:  Diagnosis Date  . CAD (coronary artery disease)    last cath by [facility] DR.  Mihai Croitoru showing  some  disease involving LCX and small size of Diag   . CHF exacerbation, due to diastolic dysfunction 27/78/2423  . Chronic systolic CHF (congestive heart failure), NYHA class 1 ([place]) 07/17/2012  . GERD (gastroesophageal reflux disease)   . Hepatic lesion 02/04/11  . Hyperlipemia   . Hypertension   . Ischemic cardiomyopathy    EF 35-40%  . Liver  hemangioma   . NSTEMI (non-ST elevated myocardial infarction) ([place]) 11/21/2009  . Pancreatitis 2000's  . Shortness of breath   . Type II diabetes mellitus ([place])     Past Surgical History:  Procedure Laterality Date  . AMPUTATION Left 01/14/2018   Procedure: TRANSMETATARSAL AMPUTATION;  Surgeon: Newt Minion, MD;  Location: [place];  Service: Orthopedics;  Laterality: Left;  . BIOPSY  04/26/2019   Procedure: BIOPSY;  Surgeon: Rush Landmark Telford Nab., MD;  Location: [facility];  Service: Gastroenterology;;  . CARDIAC CATHETERIZATION  2011   minimal disease, medical management  . COLONOSCOPY WITH PROPOFOL N/A 04/26/2019   Procedure: COLONOSCOPY WITH PROPOFOL;  Surgeon: Rush Landmark Telford Nab., MD;  [facility];  Service: Gastroenterology;  Laterality: N/A;  . CORONARY ANGIOPLASTY WITH STENT PLACEMENT  07/14/2012   successful PCI & stenting of mid LAD & PDA off the dominant CX  . CORONARY/GRAFT ACUTE MI REVASCULARIZATION N/A 12/20/2017   Procedure: Coronary/Graft Acute MI Revascularization;  Surgeon: Martinique, Peter M, MD;  Location: [place] [facility];  Service: Cardiovascular;  Laterality: N/A;  . ENTEROSCOPY N/A 04/25/2019   Procedure: ENTEROSCOPY;  Surgeon: Rush Landmark Telford Nab., MD;  [facility];  Service: Gastroenterology;  Laterality: N/A;  . ESOPHAGOGASTRODUODENOSCOPY Left 04/24/2019   Procedure: ESOPHAGOGASTRODUODENOSCOPY (EGD);  Surgeon: Lavena Bullion, DO;  [facility];  Service: Gastroenterology;  Laterality: Left;  . HEMOSTASIS CLIP PLACEMENT  04/26/2019   Procedure: HEMOSTASIS CLIP  PLACEMENT;  Surgeon: Irving Copas., MD;  Location: [facility];  Service: Gastroenterology;;  . HOT HEMOSTASIS N/A 04/26/2019   Procedure: HOT HEMOSTASIS (ARGON PLASMA COAGULATION/BICAP);  Surgeon: Irving Copas., MD;  Location: [facility];  Service: Gastroenterology;  Laterality: N/A;  . INCISION AND DRAINAGE ABSCESS ANAL  1970's  . LEFT HEART  CATH AND CORONARY ANGIOGRAPHY N/A 12/20/2017   Procedure: LEFT HEART CATH AND CORONARY ANGIOGRAPHY;  Surgeon: Martinique, Peter M, MD;  Location: [place] [facility];  Service: Cardiovascular;  Laterality: N/A;  . LEFT HEART CATHETERIZATION WITH CORONARY ANGIOGRAM N/A 07/14/2012   Procedure: LEFT HEART CATHETERIZATION WITH CORONARY ANGIOGRAM;  Surgeon: [name], MD;  Location: [place] [facility];  Service: Cardiovascular;  Laterality: N/A;  . PERCUTANEOUS CORONARY STENT INTERVENTION (PCI-S) Right 07/14/2012   Procedure: PERCUTANEOUS CORONARY STENT INTERVENTION (PCI-S);  Surgeon: [name], MD;  Location: [facility];  Service: Cardiovascular;  Laterality: Right;  . POLYPECTOMY  04/26/2019   Procedure: POLYPECTOMY;  Surgeon: Mansouraty, Telford Nab., MD;  Location: [facility];  Service: Gastroenterology;;  . Lia Foyer TATTOO INJECTION  04/25/2019   Procedure: SUBMUCOSAL TATTOO INJECTION;  Surgeon: Irving Copas., MD;  Location: [facility];  Service: Gastroenterology;;    Social History   Socioeconomic History  . Marital status: Single    Spouse name: Not on file  . Number of children: 0  . Years of education: Not on file  . Highest education level: Not on file  Occupational History  . Occupation: retired    Fish farm manager: [employer]  Social Needs  . Financial resource strain: Not on file  . Food insecurity    Worry: Not on file    Inability: Not on file  . Transportation needs    Medical: Not on file    Non-medical: Not on file  Tobacco Use  . Smoking status: Never Smoker  . Smokeless tobacco: Never Used  Substance and Sexual Activity  . Alcohol use: No  . Drug use: No  . Sexual activity: Never  Lifestyle  . Physical activity    Days per week: Not on file    Minutes per session: Not on file  . Stress: Not on file  Relationships  . Social Herbalist on phone: Not on file    Gets together: Not on file    Attends religious service: Not on file    Active  member of club or organization: Not on file    Attends meetings of clubs or organizations: Not on file    Relationship status: Not on file  Other Topics Concern  . Not on file  Social History Narrative  . Not on file    Family History  Problem Relation Age of Onset  . Diabetes Mother   . Diabetes Paternal Grandmother   . Diabetes Maternal Grandfather   . Colon cancer Neg Hx     Health Maintenance  Topic Date Due  . Hepatitis C Screening  1946/01/15  . OPHTHALMOLOGY EXAM  09/09/1958  . URINE MICROALBUMIN  11/25/2015  . PNA vac Low Risk Adult (2 of 2 - PPSV23) 12/05/2015  . INFLUENZA VACCINE  10/22/2024 (Originally 04/28/2019)  . HEMOGLOBIN A1C  09/29/2019  . FOOT EXAM  10/03/2019  . TETANUS/TDAP  12/05/2023  . COLONOSCOPY  04/25/2029    ----------------------------------------------------------------------------------------------------------------------------------------------------------------------------------------------------------------- Physical Exam BP (!) 160/84   Pulse 80   Temp 98 F (36.7 C) (Oral)   Ht 6' (1.829 m)   Wt 189 lb 3.2 oz (85.8  kg)   SpO2 97%   BMI 25.66 kg/m   Physical Exam Constitutional:      Appearance: Normal appearance.  HENT:     Head: Normocephalic and atraumatic.     Mouth/Throat:     Mouth: Mucous membranes are moist.  Eyes:     General: No scleral icterus. Cardiovascular:     Rate and Rhythm: Normal rate and regular rhythm.  Pulmonary:     Effort: Pulmonary effort is normal.     Breath sounds: Normal breath sounds.  Abdominal:     General: Abdomen is flat. There is no distension.     Tenderness: There is no abdominal tenderness.  Skin:    General: Skin is warm and dry.  Neurological:     General: No focal deficit present.     Mental Status: He is alert.  Psychiatric:        Mood and Affect: Mood normal.        Behavior: Behavior normal.      ------------------------------------------------------------------------------------------------------------------------------------------------------------------------------------------------------------------- Assessment and Plan  Acute upper GI bleed -Stable at this time.  Discussed he should stay off Brilinta at this time.  He has resumed ASA without and s/s of bleeding.  -Recheck H&H today.  -Has upcoming cataract surgery, I think it is ok to go ahead and proceed with this   Essential hypertension BP has been stable, he will continue current medications.   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 [facility]) Lab Results  Component Value Date   HGBA1C 9.1 (A) 03/29/2019  Poorly controlled, he is followed by endocrinology for management of this.  Discussed low carb diet.   Pancreatic insufficiency -Continue pancreatic enzyme supplementation and insulin for diabetes.

## 2019-05-08 NOTE — Patient Instructions (Addendum)
Continue aspirin daily.  You should continue off the Brilinta per recommendations in the hospital.  We'll be in touch with lab results.

## 2019-05-09 LAB — COMPREHENSIVE METABOLIC PANEL WITH GFR
ALT: 32 U/L (ref 0–53)
AST: 21 U/L (ref 0–37)
Albumin: 3.7 g/dL (ref 3.5–5.2)
Alkaline Phosphatase: 89 U/L (ref 39–117)
BUN: 19 mg/dL (ref 6–23)
CO2: 27 meq/L (ref 19–32)
Calcium: 8.7 mg/dL (ref 8.4–10.5)
Chloride: 107 meq/L (ref 96–112)
Creatinine, Ser: 1.05 mg/dL (ref 0.40–1.50)
GFR: 69.7 mL/min
Glucose, Bld: 194 mg/dL — ABNORMAL HIGH (ref 70–99)
Potassium: 5 meq/L (ref 3.5–5.1)
Sodium: 140 meq/L (ref 135–145)
Total Bilirubin: 0.5 mg/dL (ref 0.2–1.2)
Total Protein: 6.3 g/dL (ref 6.0–8.3)

## 2019-05-09 LAB — CBC
HCT: 32.5 % — ABNORMAL LOW (ref 39.0–52.0)
Hemoglobin: 10.9 g/dL — ABNORMAL LOW (ref 13.0–17.0)
MCHC: 33.4 g/dL (ref 30.0–36.0)
MCV: 95.3 fl (ref 78.0–100.0)
Platelets: 279 10*3/uL (ref 150.0–400.0)
RBC: 3.4 Mil/uL — ABNORMAL LOW (ref 4.22–5.81)
RDW: 15.6 % — ABNORMAL HIGH (ref 11.5–15.5)
WBC: 6.5 10*3/uL (ref 4.0–10.5)

## 2019-05-11 ENCOUNTER — Encounter: Payer: Self-pay | Admitting: Family Medicine

## 2019-05-11 NOTE — Assessment & Plan Note (Signed)
Lab Results  Component Value Date   HGBA1C 9.1 (A) 03/29/2019  Poorly controlled, he is followed by endocrinology for management of this.  Discussed low carb diet.

## 2019-05-11 NOTE — Assessment & Plan Note (Signed)
-  Continue pancreatic enzyme supplementation and insulin for diabetes.

## 2019-05-11 NOTE — Assessment & Plan Note (Signed)
BP has been stable, he will continue current medications.

## 2019-05-14 IMAGING — CR DG FOOT COMPLETE 3+V*L*
3 series · 3 of 3 positions shown · non-contrast
Comparison: None.

CLINICAL DATA: Soft tissue infection of the left foot for 1 day in
a diabetic patient. Initial encounter.

EXAM:
LEFT FOOT - COMPLETE 3+ VIEW

[foot ap]
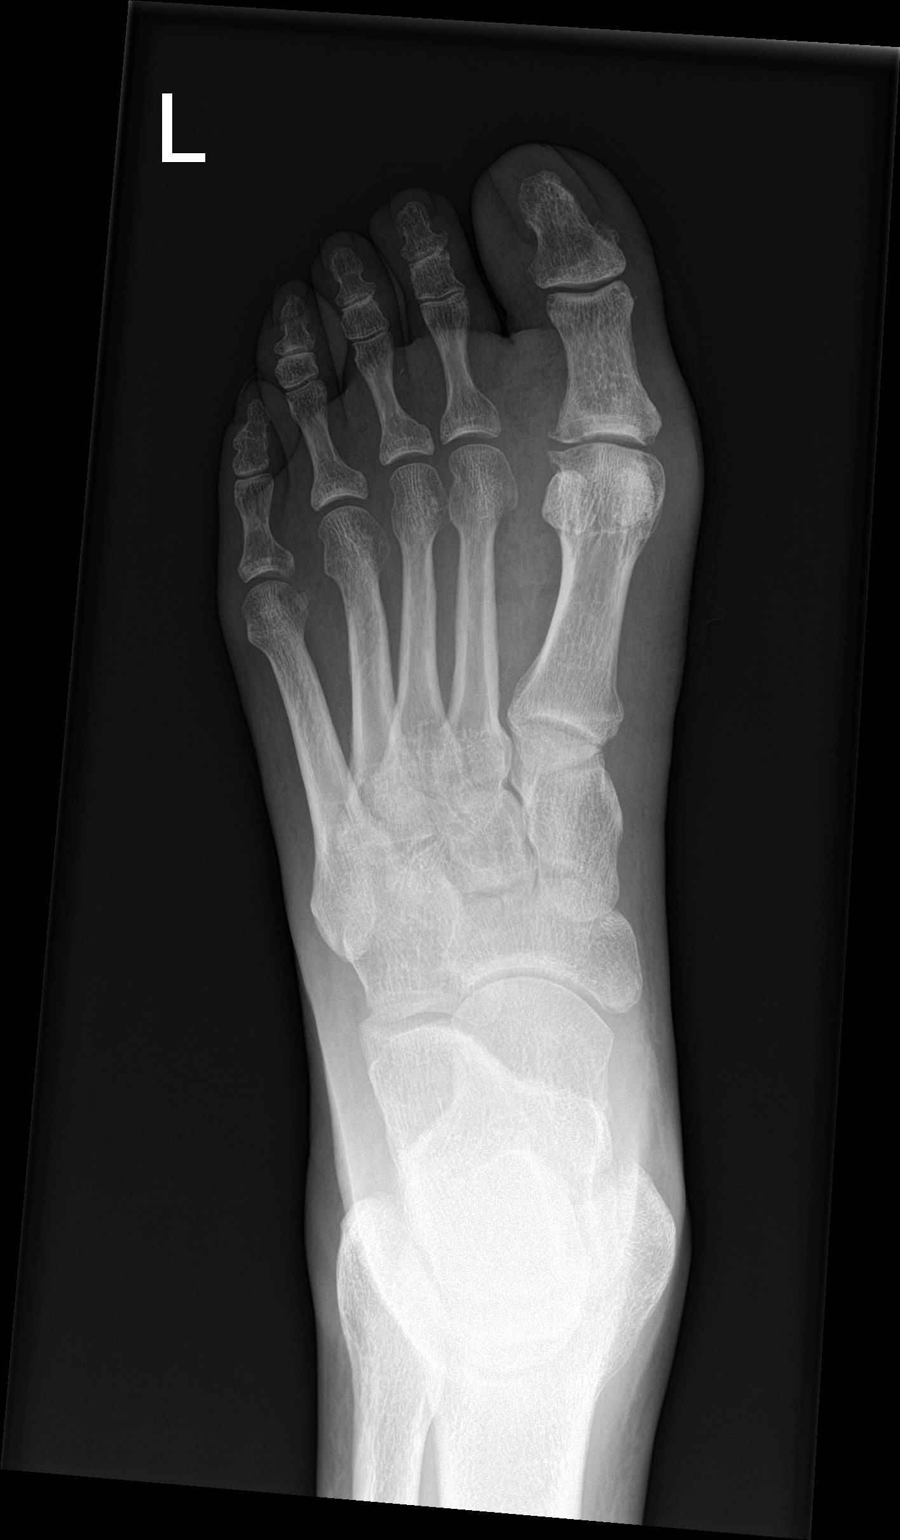

[foot obl]
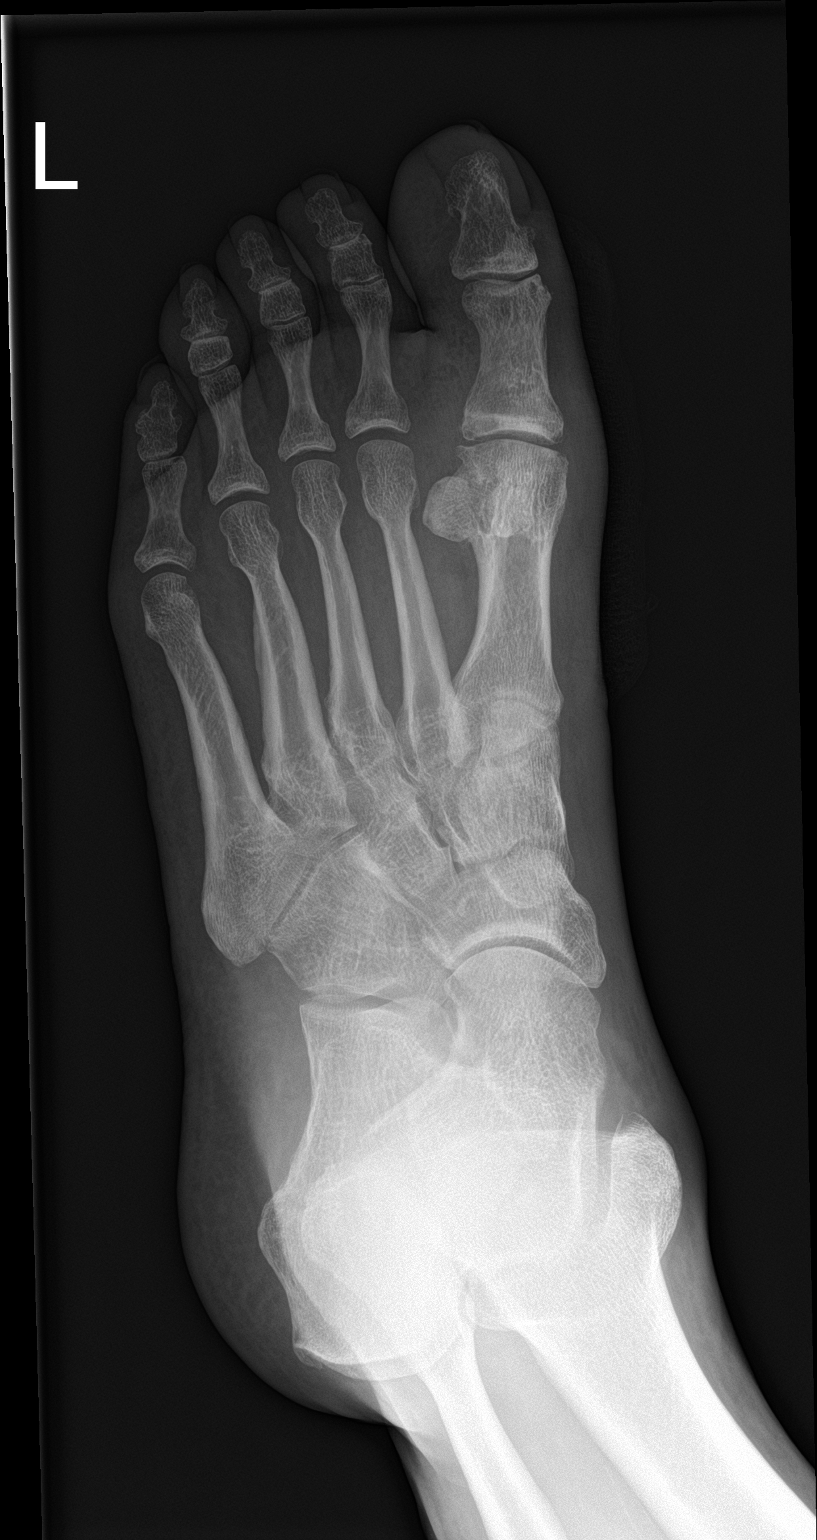

[foot lat]
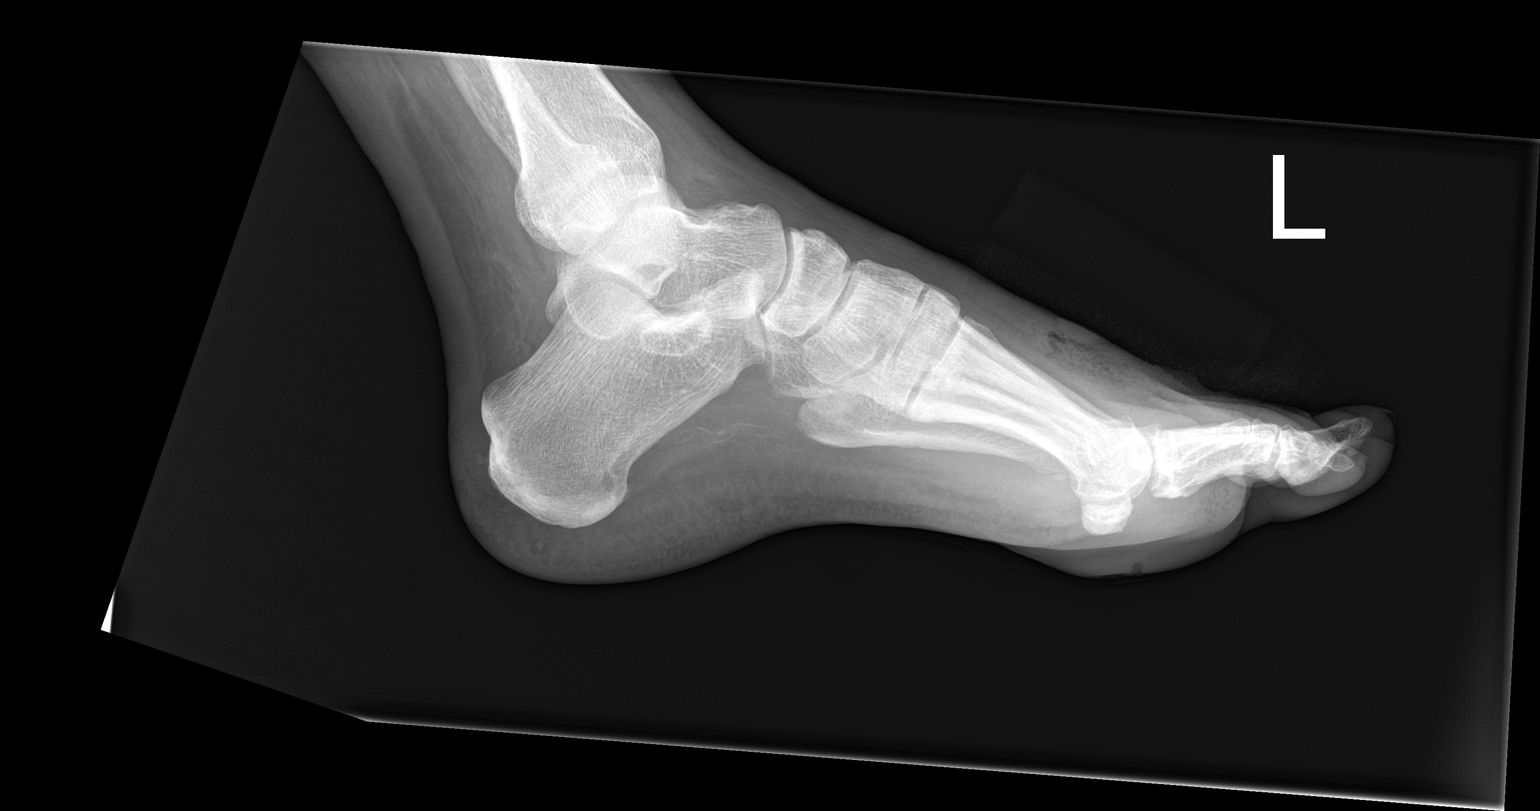

[3 of 3 positions shown; findings below may reference images not displayed]

FINDINGS: A skin ulceration and soft tissue gas are seen over the dorsum of
the foot at and proximal to the great toe. No radiopaque foreign
body is identified. There is no bony destructive change or
periosteal reaction. Mild to moderate first MTP osteoarthritis is
noted.
IMPRESSION: Findings consistent with cellulitis centered about the right great
toe and first metatarsal. No plain film evidence of osteomyelitis.

Moderate right first MTP osteoarthritis.

## 2019-05-14 IMAGING — DX DG CHEST 1V PORT
1 series · 1 of 1 positions shown · non-contrast
Comparison: None.

CLINICAL DATA: Sepsis

EXAM:
PORTABLE CHEST 1 VIEW

[chest ap]
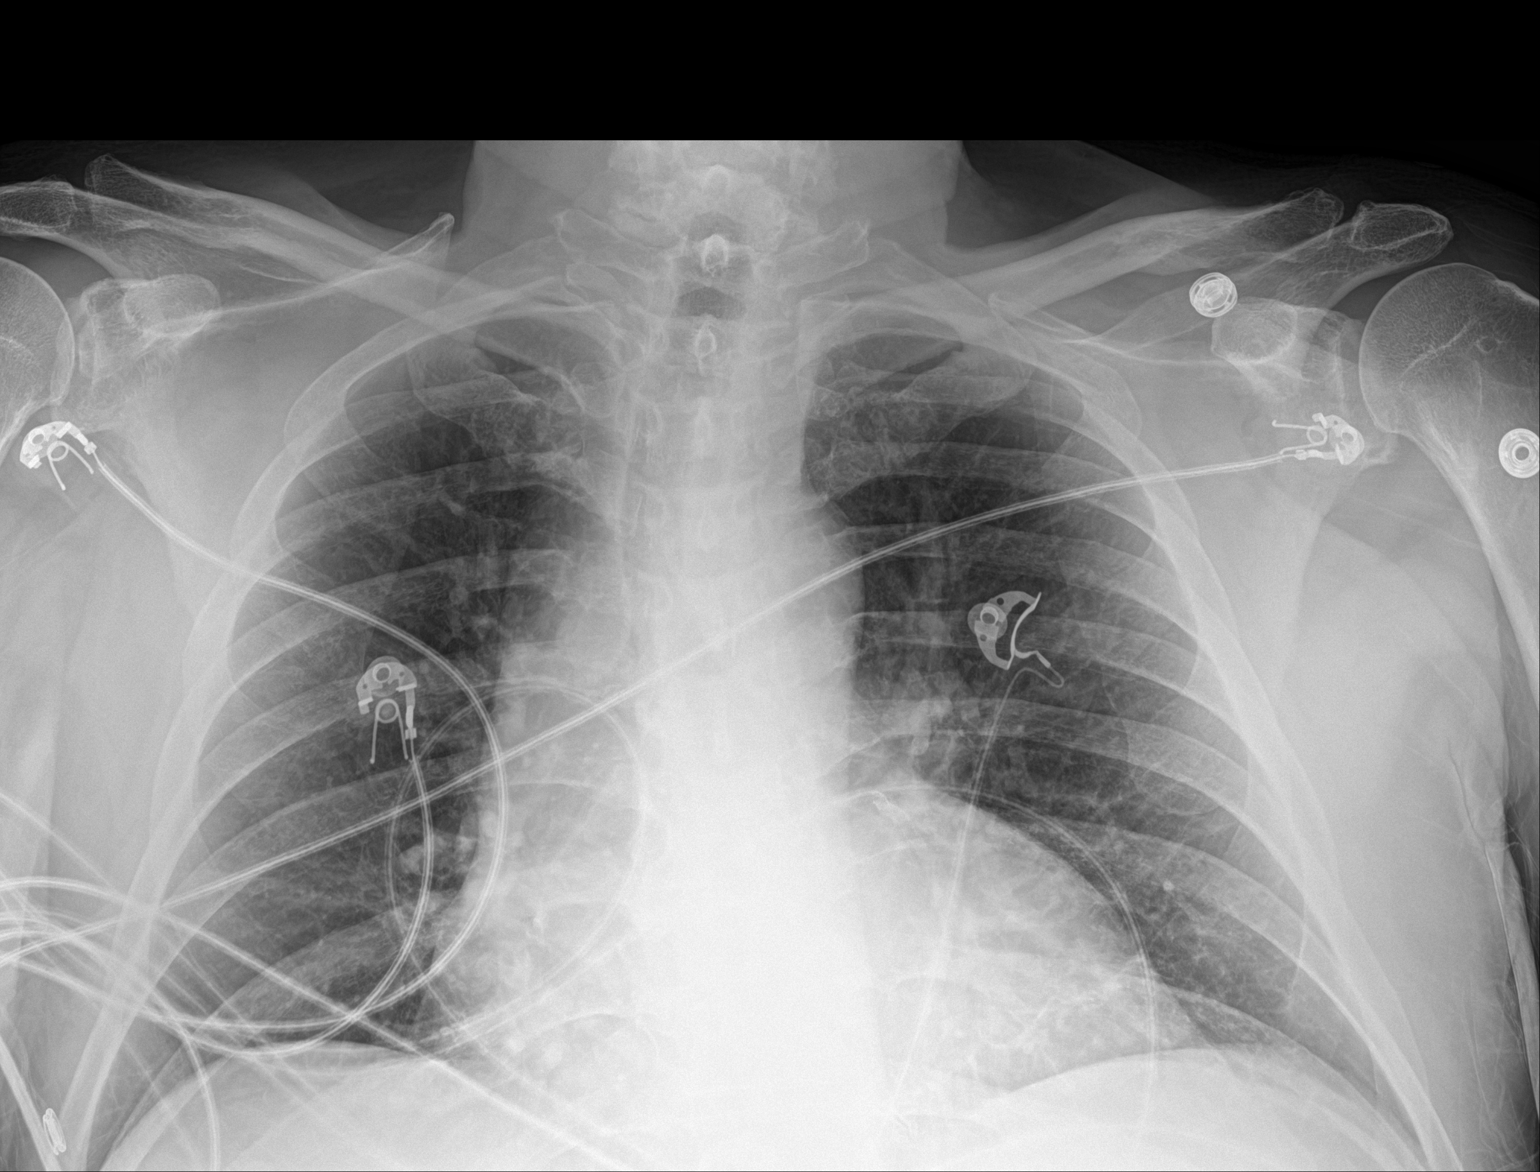

[1 of 1 positions shown; findings below may reference images not displayed]

FINDINGS: Normal mediastinum and cardiac silhouette. Normal pulmonary
vasculature. No evidence of effusion, infiltrate, or pneumothorax.
No acute bony abnormality.
IMPRESSION: No acute cardiopulmonary process.

## 2019-05-14 NOTE — Progress Notes (Signed)
Please let patient know:  Blood sugars are elevated but improved compared to a couple weeks ago.  Continue current medications for diabetes.   Hemoglobin has improved and I think should continue to improve over the next few weeks.  This should help with his energy levels.   Thanks!

## 2019-05-29 ENCOUNTER — Ambulatory Visit: Payer: Medicare Other | Admitting: Gastroenterology

## 2019-06-01 ENCOUNTER — Ambulatory Visit: Payer: Medicare Other | Admitting: Endocrinology

## 2019-06-06 ENCOUNTER — Ambulatory Visit: Payer: Medicare Other | Admitting: Gastroenterology

## 2019-06-06 ENCOUNTER — Encounter: Payer: Self-pay | Admitting: General Surgery

## 2019-06-11 ENCOUNTER — Other Ambulatory Visit: Payer: Self-pay

## 2019-06-12 ENCOUNTER — Ambulatory Visit (INDEPENDENT_AMBULATORY_CARE_PROVIDER_SITE_OTHER): Payer: Medicare Other | Admitting: Endocrinology

## 2019-06-12 ENCOUNTER — Encounter: Payer: Self-pay | Admitting: Endocrinology

## 2019-06-12 VITALS — BP 204/100 | HR 96 | Ht 72.0 in | Wt 193.0 lb

## 2019-06-12 DIAGNOSIS — K8689 Other specified diseases of pancreas: Secondary | ICD-10-CM | POA: Diagnosis not present

## 2019-06-12 DIAGNOSIS — E089 Diabetes mellitus due to underlying condition without complications: Secondary | ICD-10-CM

## 2019-06-12 DIAGNOSIS — Z9114 Patient's other noncompliance with medication regimen: Secondary | ICD-10-CM | POA: Diagnosis not present

## 2019-06-12 DIAGNOSIS — D649 Anemia, unspecified: Secondary | ICD-10-CM

## 2019-06-12 DIAGNOSIS — E08649 Diabetes mellitus due to underlying condition with hypoglycemia without coma: Secondary | ICD-10-CM

## 2019-06-12 LAB — POCT GLYCOSYLATED HEMOGLOBIN (HGB A1C): Hemoglobin A1C: 8.2 % — AB (ref 4.0–5.6)

## 2019-06-12 MED ORDER — LANTUS SOLOSTAR 100 UNIT/ML ~~LOC~~ SOPN: 68.0000 [IU] | PEN_INJECTOR | SUBCUTANEOUS | 11 refills | Status: DC

## 2019-06-12 NOTE — Progress Notes (Signed)
Subjective:    Patient ID: David Lane, male    DOB: 1945-12-11, 73 y.o.   MRN: [id number]  HPI Pt returns for f/u of diabetes mellitus: DM type: due to pancreatic insufficiency Dx'ed: 1988, during an episode of pancreatitis.   Complications: CAD, DR, PAD, left transmetatarsal amputation, and PN.  Therapy: insulin since 1990.   DKA: never.   Severe hypoglycemia: last episode was in 2014; he says this was probably due to taking more than the prescribed insulin dosage.  Other: in 2014, he was changed to qd lantus, for further simplification of his insulin schedule.  V-GO pump was considered, but pt twice did not keep appt for it; he is retired.   Interval history: no cbg record, but states cbg's vary from 70-400.  He has mild hypoglycemia approx twice per month.  This happens fasting, or with exertion. pt states he otherwise feels well in general.  He says he does not miss Lantus, but often does not take until later in the day.  He had transfusion 6 weeks ago Pt says he has missed carvedilol this am.  He says BP was 90/60 yesterday AM.  He averages 0-10 total units of novolog per day.   Past Medical History:  Diagnosis Date   CAD (coronary artery disease)    last cath by [facility] DR.  Mihai Croitoru showing  some  disease involving LCX and small size of Diag    CHF exacerbation, due to diastolic dysfunction [id number]   Chronic systolic CHF (congestive heart failure), NYHA class 1 ([place]) 07/17/2012   GERD (gastroesophageal reflux disease)    Hepatic lesion 02/04/11   Hyperlipemia    Hypertension    Ischemic cardiomyopathy    EF 35-40%   Liver hemangioma    NSTEMI (non-ST elevated myocardial infarction) ([place]) 11/21/2009   Pancreatitis 2000's   Shortness of breath    Type II diabetes mellitus ([place])     Past Surgical History:  Procedure Laterality Date   AMPUTATION Left 01/14/2018   Procedure: TRANSMETATARSAL AMPUTATION;  Surgeon: Newt Minion, MD;  Location: [place];   Service: Orthopedics;  Laterality: Left;   BIOPSY  04/26/2019   Procedure: BIOPSY;  Surgeon: Irving Copas., MD;  [facility];  Service: Gastroenterology;;   CARDIAC CATHETERIZATION  2011   minimal disease, medical management   COLONOSCOPY WITH PROPOFOL N/A 04/26/2019   Procedure: COLONOSCOPY WITH PROPOFOL;  Surgeon: Rush Landmark Telford Nab., MD;  Location: [facility];  Service: Gastroenterology;  Laterality: N/A;   CORONARY ANGIOPLASTY WITH STENT PLACEMENT  07/14/2012   successful PCI & stenting of mid LAD & PDA off the dominant CX   CORONARY/GRAFT ACUTE MI REVASCULARIZATION N/A 12/20/2017   Procedure: Coronary/Graft Acute MI Revascularization;  Surgeon: Martinique, Peter M, MD;  Location: [place] [facility];  Service: Cardiovascular;  Laterality: N/A;   ENTEROSCOPY N/A 04/25/2019   Procedure: ENTEROSCOPY;  Surgeon: Rush Landmark Telford Nab., MD;  [facility];  Service: Gastroenterology;  Laterality: N/A;   ESOPHAGOGASTRODUODENOSCOPY Left 04/24/2019   Procedure: ESOPHAGOGASTRODUODENOSCOPY (EGD);  Surgeon: Lavena Bullion, DO;  [facility];  Service: Gastroenterology;  Laterality: Left;   HEMOSTASIS CLIP PLACEMENT  04/26/2019   Procedure: HEMOSTASIS CLIP PLACEMENT;  Surgeon: Irving Copas., MD;  [facility];  Service: Gastroenterology;;   HOT HEMOSTASIS N/A 04/26/2019   Procedure: HOT HEMOSTASIS (ARGON PLASMA COAGULATION/BICAP);  Surgeon: Irving Copas., MD;  Location: [facility];  Service: Gastroenterology;  Laterality: N/A;   INCISION AND DRAINAGE ABSCESS  ANAL  1970's   LEFT HEART CATH AND CORONARY ANGIOGRAPHY N/A 12/20/2017   Procedure: LEFT HEART CATH AND CORONARY ANGIOGRAPHY;  Surgeon: Martinique, Peter M, MD;  Location: [place] [facility];  Service: Cardiovascular;  Laterality: N/A;   LEFT HEART CATHETERIZATION WITH CORONARY ANGIOGRAM N/A 07/14/2012   Procedure: LEFT HEART CATHETERIZATION WITH CORONARY ANGIOGRAM;   Surgeon: [name], MD;  Location: [place] [facility];  Service: Cardiovascular;  Laterality: N/A;   PERCUTANEOUS CORONARY STENT INTERVENTION (PCI-S) Right 07/14/2012   Procedure: PERCUTANEOUS CORONARY STENT INTERVENTION (PCI-S);  Surgeon: [name], MD;  Location: [facility];  Service: Cardiovascular;  Laterality: Right;   POLYPECTOMY  04/26/2019   Procedure: POLYPECTOMY;  Surgeon: Mansouraty, Telford Nab., MD;  Location: [facility];  Service: Gastroenterology;;   SUBMUCOSAL TATTOO INJECTION  04/25/2019   Procedure: SUBMUCOSAL TATTOO INJECTION;  Surgeon: Irving Copas., MD;  Location: [facility];  Service: Gastroenterology;;    Social History   Socioeconomic History   Marital status: Single    Spouse name: Not on file   Number of children: 0   Years of education: Not on file   Highest education level: Not on file  Occupational History   Occupation: retired    Fish farm manager: [employer]  Social Needs   Emergency planning/management officer strain: Not on file   Food insecurity    Worry: Not on file    Inability: Not on file   Transportation needs    Medical: Not on file    Non-medical: Not on file  Tobacco Use   Smoking status: Never Smoker   Smokeless tobacco: Never Used  Substance and Sexual Activity   Alcohol use: No   Drug use: No   Sexual activity: Never  Lifestyle   Physical activity    Days per week: Not on file    Minutes per session: Not on file   Stress: Not on file  Relationships   Social connections    Talks on phone: Not on file    Gets together: Not on file    Attends religious service: Not on file    Active member of club or organization: Not on file    Attends meetings of clubs or organizations: Not on file    Relationship status: Not on file   Intimate partner violence    Fear of current or ex partner: Not on file    Emotionally abused: Not on file    Physically abused: Not on file    Forced sexual activity: Not on file  Other Topics  Concern   Not on file  Social History Narrative   Not on file    Current Outpatient Medications on File Prior to Visit  Medication Sig Dispense Refill   aspirin 81 MG tablet Take 1 tablet (81 mg total) by mouth daily. 30 tablet    carvedilol (COREG) 6.25 MG tablet Take 6.25 mg by mouth 2 (two) times daily with a meal.     escitalopram (LEXAPRO) 5 MG tablet Take 1 tablet (5 mg total) by mouth daily. 30 tablet 3   glucose blood (ONE TOUCH ULTRA TEST) test strip USE 1 STRIP TO CHECK GLUCOSE 4 TIMES DAILY; K86.89, E10.8 150 each 11   insulin aspart (NOVOLOG FLEXPEN) 100 UNIT/ML FlexPen Inject 2 Units into the skin 3 (three) times daily with meals. Take only if your blood sugar is over 400. (Patient taking differently: Inject 5-7 Units into the skin 3 (three) times daily as needed for high blood sugar. Take only  if your blood sugar is over 400.) 15 mL 11   Insulin Pen Needle (B-D UF III MINI PEN NEEDLES) 31G X 5 MM MISC use to inject insulin daily 100 each 0   lisinopril (ZESTRIL) 20 MG tablet Take 20 mg by mouth daily. 1/2 tab by mouth twice daily     Multiple Vitamins-Minerals (CENTRUM SILVER ULTRA MENS PO) Take 1 tablet by mouth daily.      Pancrelipase, Lip-Prot-Amyl, (CREON) 24000-76000 units CPEP TAKE 2 CAPSULES WITH EACH MEAL AND 1 CAPSULE WITH SMALL SNACKS (10 CAPS PER DAY) (Patient taking differently: Take 2 capsules by mouth 3 (three) times daily as needed (digestion). ) 810 capsule 0   pantoprazole (PROTONIX) 40 MG tablet Take 1 tablet (40 mg total) by mouth 2 (two) times daily before a meal. 120 tablet 0   rosuvastatin (CRESTOR) 40 MG tablet TAKE 1 TABLET(40 MG) BY MOUTH AT BEDTIME (Patient taking differently: Take 40 mg by mouth every evening. ) 30 tablet 6   No current facility-administered medications on file prior to visit.     Allergies  Allergen Reactions   Lipitor [Atorvastatin] Other (See Comments)    Muscle weakness   Metformin And Related Diarrhea    Severe  diarrhea    Family History  Problem Relation Age of Onset   Diabetes Mother    Diabetes Paternal Grandmother    Diabetes Maternal Grandfather    Colon cancer Neg Hx     BP (!) 204/100 (BP Location: Left Arm, Patient Position: Sitting, Cuff Size: Normal)    Pulse 96    Ht 6' (1.829 m)    Wt 193 lb (87.5 kg)    SpO2 98%    BMI 26.18 kg/m    Review of Systems He denies LOC    Objective:   Physical Exam VITAL SIGNS:  See vs page.  GENERAL: no distress.   Pulses: dorsalis pedis absent bilat.   MSK: left transmetatarsal amputation.   CV: trace bilat leg edema.   Skin:  no ulcer on the feet.  normal color and temp on the feet.   Neuro: sensation is intact to touch on the feet, but decreased from normal.   Ext: there is bilateral onychomycosis of the toenails.    Lab Results  Component Value Date   HGBA1C 8.2 (A) 06/12/2019       Assessment & Plan:  DM, due to pancreatic insuff: he needs increased rx.  HTN: is noted today.  therapy limited by noncompliance.   Hypoglycemia: this limits aggressiveness of glycemic control.  Anemia: this may be suppressing A1c.  Patient Instructions  Your blood pressure is high today.  Please see your heart doctor soon, to have it rechecked.   Please increase the lantus to 68 units each morning, and:  take just 2 units novolog at a time, and only of your blood sugar is over 400.   check your blood sugar twice a day.  vary the time of day when you check, between before the 3 meals, and at bedtime.  also check if you have symptoms of your blood sugar being too high or too low.  please keep a record of the readings and bring it to your next appointment here (or you can bring the meter itself).  You can write it on any piece of paper.  please call us sooner if your blood sugar goes below 70, or if you have a lot of readings over 200. Please come back for a follow-up appointment  in 2 months.

## 2019-06-12 NOTE — Patient Instructions (Addendum)
Your blood pressure is high today.  Please see your heart doctor soon, to have it rechecked.   Please increase the lantus to 68 units each morning, and:  take just 2 units novolog at a time, and only of your blood sugar is over 400.   check your blood sugar twice a day.  vary the time of day when you check, between before the 3 meals, and at bedtime.  also check if you have symptoms of your blood sugar being too high or too low.  please keep a record of the readings and bring it to your next appointment here (or you can bring the meter itself).  You can write it on any piece of paper.  please call us sooner if your blood sugar goes below 70, or if you have a lot of readings over 200. Please come back for a follow-up appointment in 2 months.

## 2019-06-20 ENCOUNTER — Telehealth (INDEPENDENT_AMBULATORY_CARE_PROVIDER_SITE_OTHER): Payer: Medicare Other | Admitting: Cardiovascular Disease

## 2019-06-20 ENCOUNTER — Telehealth: Payer: Self-pay | Admitting: *Deleted

## 2019-06-20 VITALS — BP 91/64 | HR 96 | Ht 72.0 in | Wt 193.0 lb

## 2019-06-20 DIAGNOSIS — E7849 Other hyperlipidemia: Secondary | ICD-10-CM

## 2019-06-20 DIAGNOSIS — D62 Acute posthemorrhagic anemia: Secondary | ICD-10-CM

## 2019-06-20 DIAGNOSIS — Z89432 Acquired absence of left foot: Secondary | ICD-10-CM

## 2019-06-20 DIAGNOSIS — I10 Essential (primary) hypertension: Secondary | ICD-10-CM

## 2019-06-20 DIAGNOSIS — K8689 Other specified diseases of pancreas: Secondary | ICD-10-CM

## 2019-06-20 DIAGNOSIS — Z955 Presence of coronary angioplasty implant and graft: Secondary | ICD-10-CM

## 2019-06-20 DIAGNOSIS — D649 Anemia, unspecified: Secondary | ICD-10-CM

## 2019-06-20 DIAGNOSIS — E089 Diabetes mellitus due to underlying condition without complications: Secondary | ICD-10-CM

## 2019-06-20 DIAGNOSIS — E785 Hyperlipidemia, unspecified: Secondary | ICD-10-CM

## 2019-06-20 DIAGNOSIS — K922 Gastrointestinal hemorrhage, unspecified: Secondary | ICD-10-CM

## 2019-06-20 DIAGNOSIS — I2510 Atherosclerotic heart disease of native coronary artery without angina pectoris: Secondary | ICD-10-CM

## 2019-06-20 DIAGNOSIS — I5042 Chronic combined systolic (congestive) and diastolic (congestive) heart failure: Secondary | ICD-10-CM

## 2019-06-20 DIAGNOSIS — R7309 Other abnormal glucose: Secondary | ICD-10-CM

## 2019-06-20 MED ORDER — LISINOPRIL 10 MG PO TABS: 10.0000 mg | ORAL_TABLET | Freq: Every day | ORAL | 1 refills | Status: DC

## 2019-06-20 NOTE — Patient Instructions (Signed)
Medication Instructions:  DECREASE Lisinopril to 10 mg once daily in the morning STOP the Brilinta  If you need a refill on your cardiac medications before your next appointment, please call your pharmacy.   Lab work: Your provider would like for you to return in the next few weeks to have the following labs drawn: fasting Lipid and CBC. You do not need an appointment for the lab. Once in our office lobby there is a podium where you can sign in and ring the doorbell to alert [place] that you are here. The lab is open from 8:00 am to 4:30 pm; closed for lunch from 12:45pm-1:45pm.  If you have labs (blood work) drawn today and your tests are completely normal, you will receive your results only by: Marland Kitchen MyChart Message (if you have MyChart) OR . A paper copy in the mail If you have any lab test that is abnormal or we need to change your treatment, we will call you to review the results.  Testing/Procedures: None ordered  Follow-Up: At [facility], you and your health needs are our priority.  As part of our continuing mission to provide you with exceptional heart care, we have created designated Provider Care Teams.  These Care Teams include your primary Cardiologist (physician) and Advanced Practice Providers (APPs -  Physician Assistants and Nurse Practitioners) who all work together to provide you with the care you need, when you need it. You will need a follow up appointment in 6 months.  Please call our office 2 months in advance to schedule this appointment.  You may see [name], MD or one of the following Advanced Practice Providers on your designated Care Team: [place], [place] . Fabian Sharp, PA-C

## 2019-06-20 NOTE — Progress Notes (Signed)
Virtual Visit via Telephone Note   This visit type was conducted due to national recommendations for restrictions regarding the COVID-19 Pandemic (e.g. social distancing) in an effort to limit this patient's exposure and mitigate transmission in our community.  Due to his co-morbid illnesses, this patient is at least at moderate risk for complications without adequate follow up.  This format is felt to be most appropriate for this patient at this time.  The patient did not have access to video technology/had technical difficulties with video requiring transitioning to audio format only (telephone).  All issues noted in this document were discussed and addressed.  No physical exam could be performed with this format.  Please refer to the patient's chart for his  consent to telehealth for [facility].   Date:  06/20/2019   ID:  David Lane, David Lane [birth year], MRN [id number]  Patient Location: Home Provider Location: Office  PCP:  Luetta Nutting, DO  Cardiologist:  [name], MD  Electrophysiologist:  None   Evaluation Performed:  Follow-Up Visit  Chief Complaint:  CAD, HTN  History of Present Illness:    David Lane is a 73 y.o. male with chronic combined systolic and diastolic heart failure due to ischemic cardiomyopathy (EF 35-40% by echo March 2019), coronary artery disease (anterior STEMI March 2019 with placement of DES in mid LAD artery, 80% restenosis in L-PDA stent and 75% stenosis distal to PDA stent), severe hypertension, brittle type I diabetes mellitus  and pancreatic exocrine insufficiency following severe gallstone pancreatitis, PAD with transmetatarsal amputation of the left foot, diabetic retinopathy requiring surgery to the left eye, diabetic neuropathy.  In July 2020 he had hematemesis and melena and required transfusion of 2 units of PRBC.  Upper and lower GI endoscopy did not reveal a categorical source of his bleeding.  He was told to stop his Brilinta, but  misunderstood instructions and restarted after a couple of weeks and is still taking it now. He has not had recurrent bleeding.  Most recent hemoglobin was around 10.  Glycemic control has actually improved with an A1c of 8.2% which is not ideal but is better than his usual values.  He has had some issues with low blood pressure and has been skipping his meds intermittently.  Most recently his highest blood pressure has been 123/70 and he often has systolic blood pressures in the low 90s.  He has not had syncope or falls.    He denies intermittent claudication.  He has not had lower extremity edema, orthopnea or PND.  He denies any focal neurological events.  The patient does not have symptoms concerning for COVID-19 infection (fever, chills, cough, or new shortness of breath).    Past Medical History:  Diagnosis Date   CAD (coronary artery disease)    last cath by [facility] DR.  Leilynn Pilat showing  some  disease involving LCX and small size of Diag    CHF exacerbation, due to diastolic dysfunction [id number]   Chronic systolic CHF (congestive heart failure), NYHA class 1 ([place]) 07/17/2012   GERD (gastroesophageal reflux disease)    Hepatic lesion 02/04/11   Hyperlipemia    Hypertension    Ischemic cardiomyopathy    EF 35-40%   Liver hemangioma    NSTEMI (non-ST elevated myocardial infarction) ([place]) 11/21/2009   Pancreatitis 2000's   Shortness of breath    Type II diabetes mellitus [facility])    Past Surgical History:  Procedure Laterality Date   AMPUTATION Left 01/14/2018  Procedure: TRANSMETATARSAL AMPUTATION;  Surgeon: Newt Minion, MD;  Location: [place];  Service: Orthopedics;  Laterality: Left;   BIOPSY  04/26/2019   Procedure: BIOPSY;  Surgeon: Irving Copas., MD;  Location: [facility];  Service: Gastroenterology;;   CARDIAC CATHETERIZATION  2011   minimal disease, medical management   COLONOSCOPY WITH PROPOFOL N/A 04/26/2019   Procedure: COLONOSCOPY WITH  PROPOFOL;  Surgeon: Rush Landmark Telford Nab., MD;  Location: [facility];  Service: Gastroenterology;  Laterality: N/A;   CORONARY ANGIOPLASTY WITH STENT PLACEMENT  07/14/2012   successful PCI & stenting of mid LAD & PDA off the dominant CX   CORONARY/GRAFT ACUTE MI REVASCULARIZATION N/A 12/20/2017   Procedure: Coronary/Graft Acute MI Revascularization;  Surgeon: Martinique, Peter M, MD;  Location: [place] CV LAB;  Service: Cardiovascular;  Laterality: N/A;   ENTEROSCOPY N/A 04/25/2019   Procedure: ENTEROSCOPY;  Surgeon: Rush Landmark Telford Nab., MD;  Location: [facility];  Service: Gastroenterology;  Laterality: N/A;   ESOPHAGOGASTRODUODENOSCOPY Left 04/24/2019   Procedure: ESOPHAGOGASTRODUODENOSCOPY (EGD);  Surgeon: Lavena Bullion, DO;  Location: [facility];  Service: Gastroenterology;  Laterality: Left;   HEMOSTASIS CLIP PLACEMENT  04/26/2019   Procedure: HEMOSTASIS CLIP PLACEMENT;  Surgeon: Irving Copas., MD;  Location: [facility];  Service: Gastroenterology;;   HOT HEMOSTASIS N/A 04/26/2019   Procedure: HOT HEMOSTASIS (ARGON PLASMA COAGULATION/BICAP);  Surgeon: Irving Copas., MD;  Location: [facility];  Service: Gastroenterology;  Laterality: N/A;   INCISION AND DRAINAGE ABSCESS ANAL  1970's   LEFT HEART CATH AND CORONARY ANGIOGRAPHY N/A 12/20/2017   Procedure: LEFT HEART CATH AND CORONARY ANGIOGRAPHY;  Surgeon: Martinique, Peter M, MD;  Location: [place] CV [facility];  Service: Cardiovascular;  Laterality: N/A;   LEFT HEART CATHETERIZATION WITH CORONARY ANGIOGRAM N/A 07/14/2012   Procedure: LEFT HEART CATHETERIZATION WITH CORONARY ANGIOGRAM;  Surgeon: [name], MD;  Location: [place] CATH LAB;  Service: Cardiovascular;  Laterality: N/A;   PERCUTANEOUS CORONARY STENT INTERVENTION (PCI-S) Right 07/14/2012   Procedure: PERCUTANEOUS CORONARY STENT INTERVENTION (PCI-S);  Surgeon: [name], MD;  Location: [facility];  Service: Cardiovascular;  Laterality:  Right;   POLYPECTOMY  04/26/2019   Procedure: POLYPECTOMY;  Surgeon: Rush Landmark Telford Nab., MD;  Location: [facility];  Service: Gastroenterology;;   SUBMUCOSAL TATTOO INJECTION  04/25/2019   Procedure: SUBMUCOSAL TATTOO INJECTION;  Surgeon: Irving Copas., MD;  Location: [facility];  Service: Gastroenterology;;     Current Meds  Medication Sig   aspirin 81 MG tablet Take 1 tablet (81 mg total) by mouth daily.   carvedilol (COREG) 6.25 MG tablet Take 6.25 mg by mouth 2 (two) times daily with a meal.   Cholecalciferol (VITAMIN D3) 50 MCG (2000 UT) TABS Take 1 tablet by mouth daily.   glucose blood (ONE TOUCH ULTRA TEST) test strip USE 1 STRIP TO CHECK GLUCOSE 4 TIMES DAILY; K86.89, E10.8   insulin aspart (NOVOLOG FLEXPEN) 100 UNIT/ML FlexPen Inject 2 Units into the skin 3 (three) times daily with meals. Take only if your blood sugar is over 400.   Insulin Glargine (LANTUS SOLOSTAR) 100 UNIT/ML Solostar Pen Inject 68 Units into the skin every morning. And pen needles 1/day (Patient taking differently: Inject 65 Units into the skin every morning. And pen needles 1/day)   Insulin Pen Needle (B-D UF III MINI PEN NEEDLES) 31G X 5 MM MISC use to inject insulin daily   lisinopril (ZESTRIL) 20 MG tablet Take 10 mg by mouth 2 (two) times daily.    Multiple Vitamins-Minerals (CENTRUM  SILVER ULTRA MENS PO) Take 1 tablet by mouth daily.    Pancrelipase, Lip-Prot-Amyl, (CREON) 24000-76000 units CPEP TAKE 2 CAPSULES WITH EACH MEAL AND 1 CAPSULE WITH SMALL SNACKS (10 CAPS PER DAY) (Patient taking differently: Take 2 capsules by mouth 3 (three) times daily as needed (digestion). )   pantoprazole (PROTONIX) 40 MG tablet Take 1 tablet (40 mg total) by mouth 2 (two) times daily before a meal.   rosuvastatin (CRESTOR) 40 MG tablet TAKE 1 TABLET(40 MG) BY MOUTH AT BEDTIME   ticagrelor (BRILINTA) 90 MG TABS tablet Take 90 mg by mouth 2 (two) times daily.     Allergies:   Lipitor  [atorvastatin] and Metformin and related   Social History   Tobacco Use   Smoking status: Never Smoker   Smokeless tobacco: Never Used  Substance Use Topics   Alcohol use: No   Drug use: No     Family Hx: The patient's family history includes Diabetes in his maternal grandfather, mother, and paternal grandmother. There is no history of Colon cancer.  ROS:   Please see the history of present illness.    All other systems reviewed and are negative.  Prior CV studies:   The following studies were reviewed today: Notes, labs, GI procedure notes   Labs/Other Tests and Data Reviewed:    EKG:  An ECG dated 04/23/2019 was personally reviewed today and demonstrated:  NSR, left axis deviation, Q waves V1-V5, QTc 439 ms  Recent Labs: 10/06/2018: TSH 1.94 05/08/2019: ALT 32; BUN 19; Creatinine, Ser 1.05; Hemoglobin 10.9; Platelets 279.0; Potassium 5.0; Sodium 140   Recent Lipid Panel Lab Results  Component Value Date/Time   CHOL 102 12/21/2017 03:45 AM   TRIG 70 12/21/2017 03:45 AM   HDL 24 (L) 12/21/2017 03:45 AM   CHOLHDL 4.3 12/21/2017 03:45 AM   LDLCALC 64 12/21/2017 03:45 AM    Wt Readings from Last 3 Encounters:  06/20/19 193 lb (87.5 kg)  06/12/19 193 lb (87.5 kg)  05/08/19 189 lb 3.2 oz (85.8 kg)     Objective:    Vital Signs:  BP 91/64    Pulse 96    Ht 6' (1.829 m)    Wt 193 lb (87.5 kg)    SpO2 98%    BMI 26.18 kg/m    VITAL SIGNS:  reviewed unable to examine  ASSESSMENT & PLAN:    1. CHF: Despite he has not had overt heart failure exacerbation.  He is not receiving any diuretics.  His blood pressure has been running low but he has also lost weight.  He is on carvedilol and lisinopril.  2. CAD: Well over a year has passed since his last acute coronary event and placement of a drug-eluting stent.  He should stop his Brilinta, especially with the recent serious GI bleeding event.  Continue aspirin 81 mg daily.  Continue rosuvastatin. 3. HTN: His blood  pressure has been low.  Not long ago we had to stop spironolactone.  I asked him to reduce lisinopril to 10 mg once daily.  I asked him to avoid missing doses of carvedilol due to the risk of rebound ischemia/rebound hypertension. 4. PAD: He has recovered well following his transmetatarsal amputation.  He has not had falls.  He does not have claudication.  Previous vascular ultrasound suggested that he has small vessel disease. 5. HLP: Rosuvastatin maximum dose.  Most recent LDL cholesterol was within target range.  Has always had a very low HDL. 6. DM: Has always  been difficult to control well, but last hemoglobin A1c shows improvement. 7. Pancr. Insuff: I have often wondered whether some of the variability in his blood pressure and glycemic control is related to unpredictable digestion/absorption of foods and medication. 8. Anemia/upper GI bleed: Stop Brilinta. Recheck CBC.  COVID-19 Education: The signs and symptoms of COVID-19 were discussed with the patient and how to seek care for testing (follow up with PCP or arrange E-visit).  The importance of social distancing was discussed today.  Time:   Today, I have spent 24 minutes with the patient with telehealth technology discussing the above problems.     Medication Adjustments/Labs and Tests Ordered: Current medicines are reviewed at length with the patient today.  Concerns regarding medicines are outlined above.   Tests Ordered: No orders of the defined types were placed in this encounter.   Medication Changes: No orders of the defined types were placed in this encounter.   Follow Up:  Virtual Visit or In Person 6 months  Signed, [name], MD  06/20/2019 1:26 PM    [place]

## 2019-06-20 NOTE — Telephone Encounter (Signed)
The patient has been called and made aware to decrease the lisinopril to 10 mg once daily and to stop the Brilinta. Lab orders and the AVS will be mailed to him.

## 2019-06-21 DIAGNOSIS — I2510 Atherosclerotic heart disease of native coronary artery without angina pectoris: Secondary | ICD-10-CM | POA: Insufficient documentation

## 2019-07-02 ENCOUNTER — Telehealth: Payer: Self-pay | Admitting: Family Medicine

## 2019-07-02 ENCOUNTER — Other Ambulatory Visit: Payer: Self-pay | Admitting: Adult Health

## 2019-07-02 ENCOUNTER — Other Ambulatory Visit: Payer: Self-pay | Admitting: Family Medicine

## 2019-07-02 MED ORDER — PANTOPRAZOLE SODIUM 40 MG PO TBEC: 40.0000 mg | DELAYED_RELEASE_TABLET | Freq: Two times a day (BID) | ORAL | 0 refills | Status: DC

## 2019-07-02 NOTE — Telephone Encounter (Signed)
Pt called stated he was prescribe pantoprazole 40 mg back in 03/2019 when he was in the hospital. Pt is out of this med and was wondering if Dr. Zigmund Daniel can send in rx to [facility]. Please advise.

## 2019-07-02 NOTE — Telephone Encounter (Signed)
Refill completed.

## 2019-07-03 ENCOUNTER — Other Ambulatory Visit: Payer: Self-pay

## 2019-07-03 MED ORDER — PANTOPRAZOLE SODIUM 40 MG PO TBEC: 40.0000 mg | DELAYED_RELEASE_TABLET | Freq: Two times a day (BID) | ORAL | 0 refills | Status: DC

## 2019-07-03 NOTE — Telephone Encounter (Signed)
Left detailed msg informing rx was sent to [facility].

## 2019-07-03 NOTE — Progress Notes (Signed)
Received fax from pharmacy requesting a 90 day supply instead of what Dr. Zigmund Daniel originally sent in for a qty of #120. Sent new rx in

## 2019-07-16 ENCOUNTER — Other Ambulatory Visit: Payer: Self-pay

## 2019-07-16 DIAGNOSIS — E089 Diabetes mellitus due to underlying condition without complications: Secondary | ICD-10-CM

## 2019-07-16 DIAGNOSIS — K8689 Other specified diseases of pancreas: Secondary | ICD-10-CM

## 2019-07-16 MED ORDER — BD PEN NEEDLE MINI U/F 31G X 5 MM MISC: 0 refills | Status: DC

## 2019-08-09 ENCOUNTER — Emergency Department (HOSPITAL_COMMUNITY): Payer: Medicare Other

## 2019-08-09 ENCOUNTER — Emergency Department (HOSPITAL_COMMUNITY)
Admission: EM | Admit: 2019-08-09 | Discharge: 2019-08-09 | Disposition: A | Discharge status: Home / Self Care | Payer: Medicare Other | Attending: Emergency Medicine | Admitting: Emergency Medicine

## 2019-08-09 DIAGNOSIS — I110 Hypertensive heart disease with heart failure: Secondary | ICD-10-CM | POA: Diagnosis not present

## 2019-08-09 DIAGNOSIS — E669 Obesity, unspecified: Secondary | ICD-10-CM | POA: Diagnosis not present

## 2019-08-09 DIAGNOSIS — Z79899 Other long term (current) drug therapy: Secondary | ICD-10-CM | POA: Insufficient documentation

## 2019-08-09 DIAGNOSIS — I5042 Chronic combined systolic (congestive) and diastolic (congestive) heart failure: Secondary | ICD-10-CM | POA: Insufficient documentation

## 2019-08-09 DIAGNOSIS — I2510 Atherosclerotic heart disease of native coronary artery without angina pectoris: Secondary | ICD-10-CM | POA: Diagnosis not present

## 2019-08-09 DIAGNOSIS — R001 Bradycardia, unspecified: Secondary | ICD-10-CM | POA: Diagnosis not present

## 2019-08-09 DIAGNOSIS — Z7982 Long term (current) use of aspirin: Secondary | ICD-10-CM | POA: Insufficient documentation

## 2019-08-09 DIAGNOSIS — R61 Generalized hyperhidrosis: Secondary | ICD-10-CM | POA: Diagnosis not present

## 2019-08-09 DIAGNOSIS — R110 Nausea: Secondary | ICD-10-CM | POA: Diagnosis not present

## 2019-08-09 DIAGNOSIS — I252 Old myocardial infarction: Secondary | ICD-10-CM | POA: Diagnosis not present

## 2019-08-09 DIAGNOSIS — I10 Essential (primary) hypertension: Secondary | ICD-10-CM | POA: Diagnosis not present

## 2019-08-09 DIAGNOSIS — Z794 Long term (current) use of insulin: Secondary | ICD-10-CM | POA: Diagnosis not present

## 2019-08-09 DIAGNOSIS — R231 Pallor: Secondary | ICD-10-CM | POA: Diagnosis not present

## 2019-08-09 DIAGNOSIS — R000 Tachycardia, unspecified: Secondary | ICD-10-CM | POA: Diagnosis not present

## 2019-08-09 DIAGNOSIS — E119 Type 2 diabetes mellitus without complications: Secondary | ICD-10-CM | POA: Diagnosis not present

## 2019-08-09 DIAGNOSIS — I509 Heart failure, unspecified: Secondary | ICD-10-CM | POA: Diagnosis not present

## 2019-08-09 LAB — COMPREHENSIVE METABOLIC PANEL
ALT: 69 U/L — ABNORMAL HIGH (ref 0–44)
AST: 41 U/L (ref 15–41)
Albumin: 3.2 g/dL — ABNORMAL LOW (ref 3.5–5.0)
Alkaline Phosphatase: 102 U/L (ref 38–126)
Anion gap: 9 (ref 5–15)
BUN: 12 mg/dL (ref 8–23)
CO2: 23 mmol/L (ref 22–32)
Calcium: 8.2 mg/dL — ABNORMAL LOW (ref 8.9–10.3)
Chloride: 104 mmol/L (ref 98–111)
Creatinine, Ser: 0.93 mg/dL (ref 0.61–1.24)
GFR calc Af Amer: >60 mL/min (ref >60)
GFR calc non Af Amer: >60 mL/min (ref >60)
Glucose, Bld: 125 mg/dL — ABNORMAL HIGH (ref 70–99)
Potassium: 4.8 mmol/L (ref 3.5–5.1)
Sodium: 136 mmol/L (ref 135–145)
Total Bilirubin: 0.6 mg/dL (ref 0.3–1.2)
Total Protein: 6.2 g/dL — ABNORMAL LOW (ref 6.5–8.1)

## 2019-08-09 LAB — CBC WITH DIFFERENTIAL/PLATELET
Abs Immature Granulocytes: 0.04 10*3/uL (ref 0.00–0.07)
Basophils Absolute: 0 10*3/uL (ref 0.0–0.1)
Basophils Relative: 0 %
Eosinophils Absolute: 0.2 10*3/uL (ref 0.0–0.5)
Eosinophils Relative: 3 %
HCT: 37.6 % — ABNORMAL LOW (ref 39.0–52.0)
Hemoglobin: 12.5 g/dL — ABNORMAL LOW (ref 13.0–17.0)
Immature Granulocytes: 1 %
Lymphocytes Relative: 20 %
Lymphs Abs: 1.6 10*3/uL (ref 0.7–4.0)
MCH: 31.3 pg (ref 26.0–34.0)
MCHC: 33.2 g/dL (ref 30.0–36.0)
MCV: 94.2 fL (ref 80.0–100.0)
Monocytes Absolute: 0.7 10*3/uL (ref 0.1–1.0)
Monocytes Relative: 9 %
Neutro Abs: 5.5 10*3/uL (ref 1.7–7.7)
Neutrophils Relative %: 67 %
Platelets: 227 10*3/uL (ref 150–400)
RBC: 3.99 MIL/uL — ABNORMAL LOW (ref 4.22–5.81)
RDW: 13.2 % (ref 11.5–15.5)
WBC: 8.2 10*3/uL (ref 4.0–10.5)
nRBC: 0 % (ref 0.0–0.2)

## 2019-08-09 LAB — LIPASE, BLOOD: Lipase: 18 U/L (ref 11–51)

## 2019-08-09 LAB — TROPONIN I (HIGH SENSITIVITY): Troponin I (High Sensitivity): 5 ng/L (ref <18)

## 2019-08-09 LAB — CBG MONITORING, ED: Glucose-Capillary: 119 mg/dL — ABNORMAL HIGH (ref 70–99)

## 2019-08-09 NOTE — ED Notes (Signed)
Patient verbalizes understanding of discharge instructions. Opportunity for questioning and answers were provided. Armband removed by staff, pt discharged from ED ambulatory to home.  

## 2019-08-09 NOTE — ED Provider Notes (Signed)
[facility] EMERGENCY DEPARTMENT Provider Note   CSN: [id number] Arrival date & time: 08/09/19  1452     History   Chief Complaint Chief Complaint  Patient presents with  . Nausea    HPI David Lane is a 72 y.o. male with PMH significant for HTN, CAD s/p PCI x3, HLD, GERD, h/o chronic calcific pancreatitis, T2DM, HFrEF, PAD who presents with episode of nausea and diaphoresis earlier today that started around 10:30am and lasted for a few hours before self-resolving. Patient states he was writing checks and paying bills when he started to feel clammy and sweating, he subsequently felt nauseous. He denies any chest pain, SOB, dizziness, palpitations, vomiting, LE swelling/erythema/pain, fevers, sick contacts. He checked his blood pressure around this time and was elevated to peak of SBP 160, normally is around 110s. States current symptoms are different than prior MI and pancreatitis flares. States CBG this am was 127 and reports compliance with home HTN and diabetic medications. He did not receive any medications other than home meds. He lives alone and states closest family members are about 30 minutes away and is concerned if something were to happen to him, no one would be close by enough to help him.    Past Medical History:  Diagnosis Date  . CAD (coronary artery disease)    last cath by [facility] DR.  Mihai Croitoru showing  some  disease involving LCX and small size of Diag   . CHF exacerbation, due to diastolic dysfunction 07/17/2012  . Chronic systolic CHF (congestive heart failure), NYHA class 1 (HCC) 07/17/2012  . GERD (gastroesophageal reflux disease)   . Hepatic lesion 02/04/11  . Hyperlipemia   . Hypertension   . Ischemic cardiomyopathy    EF 35-40%  . Liver hemangioma   . NSTEMI (non-ST elevated myocardial infarction) (HCC) 11/21/2009  . Pancreatitis 2000's  . Shortness of breath   . Type II diabetes mellitus (HCC)     Patient Active Problem List   Diagnosis Date Noted  . Coronary artery disease involving native coronary artery of native heart without angina pectoris 06/21/2019  . AVM (arteriovenous malformation) of colon   . History of colonic polyps   . Duodenitis   . Gastroesophageal reflux disease with esophagitis   . Acute upper GI bleed 04/23/2019  . Dyslipidemia (high LDL; low HDL) 07/01/2018  . Labile blood glucose   . Functional gait disorder 01/20/2018  . Acute blood loss anemia 01/20/2018  . Status post transmetatarsal amputation of left foot (HCC) 01/20/2018  . Foot ulcer due to secondary DM (HCC) 01/04/2018  . Status post coronary artery stent placement   . Acute ST elevation myocardial infarction (STEMI) involving left anterior descending (LAD) coronary artery (HCC) 12/20/2017  . 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 (HCC) 12/14/2015  . Wellness examination 11/25/2014  . Screening for prostate cancer 11/25/2014  . Pancreatic insufficiency 01/25/2013  . Ischemic cardiomyopathy, EF 50% by echo 08/01/13 07/19/2012  . Chronic combined systolic and diastolic CHF (congestive heart failure) (HCC) 07/14/2012  . Pulmonary edema, most likely due to diastolic dysfunction 07/14/2012  . CAD, 07/14/12- LAD/PDA DES   . Anxiety 12/12/2009  . Hyperlipidemia 04/24/2007  . Essential hypertension 04/24/2007  . PANCREATITIS, HX OF 04/24/2007    Past Surgical History:  Procedure Laterality Date  . AMPUTATION Left 01/14/2018   Procedure: TRANSMETATARSAL AMPUTATION;  Surgeon: Duda, Marcus V, MD;  Location: [facility] OR;  Service: Orthopedics;  Laterality: Left;  . BIOPSY  04/26/2019     Procedure: BIOPSY;  Surgeon: Irving Copas., MD;  Location: [facility];  Service: Gastroenterology;;  . CARDIAC CATHETERIZATION  2011   minimal disease, medical management  . COLONOSCOPY WITH PROPOFOL N/A 04/26/2019   Procedure: COLONOSCOPY WITH PROPOFOL;  Surgeon: Rush Landmark Telford Nab., MD;  Location: [facility];  Service:  Gastroenterology;  Laterality: N/A;  . CORONARY ANGIOPLASTY WITH STENT PLACEMENT  07/14/2012   successful PCI & stenting of mid LAD & PDA off the dominant CX  . CORONARY/GRAFT ACUTE MI REVASCULARIZATION N/A 12/20/2017   Procedure: Coronary/Graft Acute MI Revascularization;  Surgeon: Martinique, Peter M, MD;  Location: [facility];  Service: Cardiovascular;  Laterality: N/A;  . ENTEROSCOPY N/A 04/25/2019   Procedure: ENTEROSCOPY;  Surgeon: Rush Landmark Telford Nab., MD;  Location: [facility];  Service: Gastroenterology;  Laterality: N/A;  . ESOPHAGOGASTRODUODENOSCOPY Left 04/24/2019   Procedure: ESOPHAGOGASTRODUODENOSCOPY (EGD);  Surgeon: Lavena Bullion, DO;  Location: [facility];  Service: Gastroenterology;  Laterality: Left;  . HEMOSTASIS CLIP PLACEMENT  04/26/2019   Procedure: HEMOSTASIS CLIP PLACEMENT;  Surgeon: Irving Copas., MD;  Location: [facility];  Service: Gastroenterology;;  . HOT HEMOSTASIS N/A 04/26/2019   Procedure: HOT HEMOSTASIS (ARGON PLASMA COAGULATION/BICAP);  Surgeon: Irving Copas., MD;  Location: [facility];  Service: Gastroenterology;  Laterality: N/A;  . INCISION AND DRAINAGE ABSCESS ANAL  1970's  . LEFT HEART CATH AND CORONARY ANGIOGRAPHY N/A 12/20/2017   Procedure: LEFT HEART CATH AND CORONARY ANGIOGRAPHY;  Surgeon: Martinique, Peter M, MD;  Location: [facility];  Service: Cardiovascular;  Laterality: N/A;  . LEFT HEART CATHETERIZATION WITH CORONARY ANGIOGRAM N/A 07/14/2012   Procedure: LEFT HEART CATHETERIZATION WITH CORONARY ANGIOGRAM;  Surgeon: [name], MD;  Location: [facility];  Service: Cardiovascular;  Laterality: N/A;  . PERCUTANEOUS CORONARY STENT INTERVENTION (PCI-S) Right 07/14/2012   Procedure: PERCUTANEOUS CORONARY STENT INTERVENTION (PCI-S);  Surgeon: [name], MD;  Location: [facility];  Service: Cardiovascular;  Laterality: Right;  . POLYPECTOMY  04/26/2019   Procedure: POLYPECTOMY;  Surgeon: Mansouraty,  Telford Nab., MD;  Location: [facility];  Service: Gastroenterology;;  . Lia Foyer TATTOO INJECTION  04/25/2019   Procedure: SUBMUCOSAL TATTOO INJECTION;  Surgeon: Irving Copas., MD;  Location: [facility];  Service: Gastroenterology;;      Home Medications    Prior to Admission medications   Medication Sig Start Date End Date Taking? Authorizing Provider  aspirin 81 MG tablet Take 1 tablet (81 mg total) by mouth daily. 05/01/19  Yes Hosie Poisson, MD  carvedilol (COREG) 6.25 MG tablet TAKE 1 TABLET(6.25 MG) BY MOUTH TWICE DAILY Patient taking differently: Take 6.25 mg by mouth 2 (two) times daily with a meal.  07/03/19  Yes Lendon Colonel, NP  Cholecalciferol (VITAMIN D3) 50 MCG (2000 UT) TABS Take 1 tablet by mouth daily.   Yes [provider]  insulin aspart (NOVOLOG FLEXPEN) 100 UNIT/ML FlexPen Inject 2 Units into the skin 3 (three) times daily with meals. Take only if your blood sugar is over 400. Patient taking differently: Inject 0-4 Units into the skin 3 (three) times daily with meals.  03/29/19  Yes Renato Shin, MD  Insulin Glargine (LANTUS SOLOSTAR) 100 UNIT/ML Solostar Pen Inject 68 Units into the skin every morning. And pen needles 1/day Patient taking differently: Inject 68 Units into the skin daily at 12 noon.  06/12/19  Yes Renato Shin, MD  lisinopril (ZESTRIL) 10 MG tablet Take 1 tablet (10 mg total) by mouth daily. 06/20/19  Yes Croitoru, Dani Gobble, MD  Multiple Vitamins-Minerals (CENTRUM SILVER ULTRA MENS PO) Take 1 tablet by mouth daily.    Yes [provider]  Pancrelipase, Lip-Prot-Amyl, (CREON) 24000-76000 units CPEP TAKE 2 CAPSULES WITH EACH MEAL AND 1 CAPSULE WITH SMALL SNACKS (10 CAPS PER DAY) Patient taking differently: Take 2 capsules by mouth 3 (three) times daily as needed (digestion).  10/06/18  Yes Matthews, Cody, DO  pantoprazole (PROTONIX) 40 MG tablet Take 1 tablet (40 mg total) by mouth 2 (two) times daily before a meal. 07/03/19  09/01/19 Yes Matthews, Cody, DO  rosuvastatin (CRESTOR) 40 MG tablet TAKE 1 TABLET(40 MG) BY MOUTH AT BEDTIME Patient taking differently: Take 40 mg by mouth at bedtime.  02/23/19  Yes Croitoru, Mihai, MD  escitalopram (LEXAPRO) 5 MG tablet Take 1 tablet (5 mg total) by mouth daily. Patient not taking: Reported on 06/20/2019 10/06/18   Matthews, Cody, DO  glucose blood (ONE TOUCH ULTRA TEST) test strip USE 1 STRIP TO CHECK GLUCOSE 4 TIMES DAILY; K86.89, E10.8 09/25/18   Ellison, Sean, MD  Insulin Pen Needle (B-D UF III MINI PEN NEEDLES) 31G X 5 MM MISC use to inject insulin daily 07/16/19   Ellison, Sean, MD    Family History Family History  Problem Relation Age of Onset  . Diabetes Mother   . Diabetes Paternal Grandmother   . Diabetes Maternal Grandfather   . Colon cancer Neg Hx    Social History Social History   Tobacco Use  . Smoking status: Never Smoker  . Smokeless tobacco: Never Used  Substance Use Topics  . Alcohol use: No  . Drug use: No    Allergies   Lipitor [atorvastatin] and Metformin and related  Review of Systems Review of Systems  Constitutional: Positive for diaphoresis. Negative for fever.  HENT: Negative for congestion, sneezing and sore throat.   Eyes: Negative for visual disturbance.  Respiratory: Negative for chest tightness and shortness of breath.   Cardiovascular: Negative for chest pain, palpitations and leg swelling.  Gastrointestinal: Positive for nausea. Negative for abdominal distention, abdominal pain, constipation, diarrhea and vomiting.  Genitourinary: Negative for difficulty urinating and dysuria.  Neurological: Negative for dizziness.    Physical Exam Updated Vital Signs BP 132/74   Pulse 77   Resp 15   SpO2 99%   Physical Exam Constitutional:      General: He is not in acute distress.    Appearance: Normal appearance. He is obese. He is not ill-appearing, toxic-appearing or diaphoretic.  HENT:     Head: Normocephalic.     Right  Ear: External ear normal.     Left Ear: External ear normal.  Cardiovascular:     Rate and Rhythm: Normal rate and regular rhythm.     Pulses: Normal pulses.     Heart sounds: Normal heart sounds. No murmur.  Pulmonary:     Effort: Pulmonary effort is normal. No respiratory distress.     Breath sounds: Normal breath sounds. No wheezing, rhonchi or rales.  Abdominal:     General: Bowel sounds are normal. There is no distension.     Palpations: Abdomen is soft.     Tenderness: There is no abdominal tenderness. There is no guarding.  Skin:    General: Skin is warm.     Findings: No erythema or rash.     Comments: Some moisture to back.  Neurological:     Mental Status: He is alert and oriented to person, place, and time. Mental status is at baseline.  Psychiatric:          Mood and Affect: Mood normal.        Behavior: Behavior normal.     ED Treatments / Results  Labs (all labs ordered are listed, but only abnormal results are displayed) Labs Reviewed  CBC WITH DIFFERENTIAL/PLATELET - Abnormal; Notable for the following components:      Result Value   RBC 3.99 (*)    Hemoglobin 12.5 (*)    HCT 37.6 (*)    All other components within normal limits  COMPREHENSIVE METABOLIC PANEL - Abnormal; Notable for the following components:   Glucose, Bld 125 (*)    Calcium 8.2 (*)    Total Protein 6.2 (*)    Albumin 3.2 (*)    ALT 69 (*)    All other components within normal limits  CBG MONITORING, ED - Abnormal; Notable for the following components:   Glucose-Capillary 119 (*)    All other components within normal limits  LIPASE, BLOOD  TROPONIN I (HIGH SENSITIVITY)    EKG EKG Interpretation  Date/Time:  Thursday August 09 2019 14:59:30 EST Ventricular Rate:  76 PR Interval:    QRS Duration: 94 QT Interval:  360 QTC Calculation: 405 R Axis:   -59 Text Interpretation: Sinus rhythm Prolonged PR interval Inferior infarct, old Baseline wander No significant change since last  tracing Abnormal ECG Confirmed by Lockwood, Robert ([id number]) on 08/09/2019 3:45:44 PM   Radiology Dg Chest Port 1 View  Result Date: 08/09/2019 CLINICAL DATA:  History of coronary artery disease.  CHF. EXAM: PORTABLE CHEST 1 VIEW COMPARISON:  April 23, 2019 FINDINGS: The heart size is enlarged but not significantly changed from prior study. There is no pneumothorax. No large pleural effusion. No focal infiltrate. There is no acute osseous abnormality. IMPRESSION: No active disease. Electronically Signed   By: Christopher  Green M.D.   On: 08/09/2019 16:56    Procedures Procedures (including critical care time)  Medications Ordered in ED Medications - No data to display   Initial Impression / Assessment and Plan / ED Course  I have reviewed the triage vital signs and the nursing notes.  Pertinent labs & imaging results that were available during my care of the patient were reviewed by me and considered in my medical decision making (see chart for details).   72yo M w/ h/o HTN, CAD s/p PCI x3, HLD, GERD, h/o chronic calcific pancreatitis, T2DM, HFrEF, PAD who presents with self-resolved episode of nausea and diaphoresis early today when paying bills. On exam, vitals are within normal limits with normal cardiac, lung, and abdominal exams.  Lipase normal and without abdominal pain or distension, pancreatitis flare unlikely. No Murphy's sign, alk phos wnl, unlikely gallbladder pathology. Slightly elevated ALT but in the absence of other hepatic markers and pain or ongoing symptoms, unlikely to be contributing. No leukocytosis or fever, CXR unremarkable, unlikely pneumonia.  Hb improved from prior. Electrolytes wnl. EKG without acute ischemic changes and unchanged from prior, trop negative. Symptoms most likely 2/2 stress reaction from paying bills earlier today with underlying anxiety although cannot rule out transient arrythmia given prior cardiac history, would recommend outpatient follow up with  Cardiology for possible event monitor.     Final Clinical Impressions(s) / ED Diagnoses   Final diagnoses:  Nausea without vomiting  Diaphoresis    ED Discharge Orders    None       Rumball, Alison, DO 08/09/19 1755    Lockwood, Robert, MD 08/09/19 2352  

## 2019-08-09 NOTE — Discharge Instructions (Addendum)
You were seen for an episode of nausea and sweating that occurred earlier today that resolved without intervention. Your EKG and labs did not show evidence of new heart attack, pancreatitis flare, or new infection. It is thought that this episode may have been related to stress although given your prior cardiac history, it is recommended to follow up with Cardiology to assess for the need for an event monitor to look for transient arrhythmias. If you develop future chest pain, shortness of breath, abdominal pain or distension, you should come back to be seen.

## 2019-08-09 NOTE — ED Triage Notes (Signed)
Pt presents with issues with blood pressure. Reports prior to EMS arival on scene he was hot and nauseated. Hx 3 stents. Pt clammy on scene and progressively worsening diaphoresis. Denies CP.  CBG 123 103/50 HR 84 18 LAC

## 2019-08-15 ENCOUNTER — Ambulatory Visit (INDEPENDENT_AMBULATORY_CARE_PROVIDER_SITE_OTHER): Payer: Medicare Other | Admitting: Endocrinology

## 2019-08-15 ENCOUNTER — Encounter: Payer: Self-pay | Admitting: Endocrinology

## 2019-08-15 ENCOUNTER — Other Ambulatory Visit: Payer: Self-pay

## 2019-08-15 DIAGNOSIS — K8689 Other specified diseases of pancreas: Secondary | ICD-10-CM

## 2019-08-15 DIAGNOSIS — E089 Diabetes mellitus due to underlying condition without complications: Secondary | ICD-10-CM

## 2019-08-15 MED ORDER — LANTUS SOLOSTAR 100 UNIT/ML ~~LOC~~ SOPN: 68.0000 [IU] | PEN_INJECTOR | SUBCUTANEOUS | 11 refills | Status: DC

## 2019-08-15 NOTE — Patient Instructions (Addendum)
Please increase the lantus to 68 units each morning.  To prevent the blood sugar from going low in the morning, you should eat a snack at bedtime.   check your blood sugar twice a day.  vary the time of day when you check, between before the 3 meals, and at bedtime.  also check if you have symptoms of your blood sugar being too high or too low.  please keep a record of the readings and bring it to your next appointment here (or you can bring the meter itself).  You can write it on any piece of paper.  please call us sooner if your blood sugar goes below 70, or if you have a lot of readings over 200. Please come back for a follow-up appointment in 2 months.

## 2019-08-15 NOTE — Progress Notes (Signed)
Subjective:    Patient ID: David Lane, male    DOB: [birth year], 73 y.o.   MRN: [id number]  HPI telehealth visit today via phone x 10 minutes Alternatives to telehealth are presented to this patient, and the patient agrees to the telehealth visit.   Pt is advised of the cost of the visit, and agrees to this, also.   Patient is at home, and I am at the office.   Persons attending the telehealth visit: the patient and I.   Pt returns for f/u of diabetes mellitus: DM type: due to pancreatic insufficiency Dx'ed: 1988, during an episode of pancreatitis.   Complications: CAD, DR, PAD, left transmetatarsal amputation, and PN.  Therapy: insulin since 1990.   DKA: never.   Severe hypoglycemia: last episode was in 2014; he says this was probably due to taking more than the prescribed insulin dosage.  Other: in 2014, he was changed to qd lantus, for further simplification of his insulin schedule.  V-GO pump was considered, but pt twice did not keep appt for it; he is retired.   Interval history: no cbg record, but states cbg's vary from 60-400.  He has mild hypoglycemia approx twice per month.  This happens fasting.  pt states he otherwise feels well in general.  He says he does not miss Lantus, but he reduced to 65 units qam, due to hypoglycemia.  Past Medical History:  Diagnosis Date  . CAD (coronary artery disease)    last cath by [facility] DR.  Mihai Croitoru showing  some  disease involving LCX and small size of Diag   . CHF exacerbation, due to diastolic dysfunction [id number]  . Chronic systolic CHF (congestive heart failure), NYHA class 1 ([place]) 07/17/2012  . GERD (gastroesophageal reflux disease)   . Hepatic lesion 02/04/11  . Hyperlipemia   . Hypertension   . Ischemic cardiomyopathy    EF 35-40%  . Liver hemangioma   . NSTEMI (non-ST elevated myocardial infarction) ([place]) 11/21/2009  . Pancreatitis 2000's  . Shortness of breath   . Type II diabetes mellitus ([place])     Past Surgical  History:  Procedure Laterality Date  . AMPUTATION Left 01/14/2018   Procedure: TRANSMETATARSAL AMPUTATION;  Surgeon: Newt Minion, MD;  Location: [place];  Service: Orthopedics;  Laterality: Left;  . BIOPSY  04/26/2019   Procedure: BIOPSY;  Surgeon: Rush Landmark Telford Nab., MD;  Location: [facility];  Service: Gastroenterology;;  . CARDIAC CATHETERIZATION  2011   minimal disease, medical management  . COLONOSCOPY WITH PROPOFOL N/A 04/26/2019   Procedure: COLONOSCOPY WITH PROPOFOL;  Surgeon: Rush Landmark Telford Nab., MD;  [facility];  Service: Gastroenterology;  Laterality: N/A;  . CORONARY ANGIOPLASTY WITH STENT PLACEMENT  07/14/2012   successful PCI & stenting of mid LAD & PDA off the dominant CX  . CORONARY/GRAFT ACUTE MI REVASCULARIZATION N/A 12/20/2017   Procedure: Coronary/Graft Acute MI Revascularization;  Surgeon: Martinique, Peter M, MD;  Location: [place] [facility];  Service: Cardiovascular;  Laterality: N/A;  . ENTEROSCOPY N/A 04/25/2019   Procedure: ENTEROSCOPY;  Surgeon: Rush Landmark Telford Nab., MD;  [facility];  Service: Gastroenterology;  Laterality: N/A;  . ESOPHAGOGASTRODUODENOSCOPY Left 04/24/2019   Procedure: ESOPHAGOGASTRODUODENOSCOPY (EGD);  Surgeon: Lavena Bullion, DO;  [facility];  Service: Gastroenterology;  Laterality: Left;  . HEMOSTASIS CLIP PLACEMENT  04/26/2019   Procedure: HEMOSTASIS CLIP PLACEMENT;  Surgeon: Irving Copas., MD;  [facility];  Service: Gastroenterology;;  . HOT HEMOSTASIS N/A 04/26/2019  Procedure: HOT HEMOSTASIS (ARGON PLASMA COAGULATION/BICAP);  Surgeon: Irving Copas., MD;  Location: [facility];  Service: Gastroenterology;  Laterality: N/A;  . INCISION AND DRAINAGE ABSCESS ANAL  1970's  . LEFT HEART CATH AND CORONARY ANGIOGRAPHY N/A 12/20/2017   Procedure: LEFT HEART CATH AND CORONARY ANGIOGRAPHY;  Surgeon: Martinique, Peter M, MD;  Location: [place] [facility];  Service: Cardiovascular;   Laterality: N/A;  . LEFT HEART CATHETERIZATION WITH CORONARY ANGIOGRAM N/A 07/14/2012   Procedure: LEFT HEART CATHETERIZATION WITH CORONARY ANGIOGRAM;  Surgeon: [name], MD;  Location: [place] [facility];  Service: Cardiovascular;  Laterality: N/A;  . PERCUTANEOUS CORONARY STENT INTERVENTION (PCI-S) Right 07/14/2012   Procedure: PERCUTANEOUS CORONARY STENT INTERVENTION (PCI-S);  Surgeon: [name], MD;  Location: [facility];  Service: Cardiovascular;  Laterality: Right;  . POLYPECTOMY  04/26/2019   Procedure: POLYPECTOMY;  Surgeon: Mansouraty, Telford Nab., MD;  Location: [facility];  Service: Gastroenterology;;  . Lia Foyer TATTOO INJECTION  04/25/2019   Procedure: SUBMUCOSAL TATTOO INJECTION;  Surgeon: Irving Copas., MD;  Location: [facility];  Service: Gastroenterology;;    Social History   Socioeconomic History  . Marital status: Single    Spouse name: Not on file  . Number of children: 0  . Years of education: Not on file  . Highest education level: Not on file  Occupational History  . Occupation: retired    Fish farm manager: [employer]  Social Needs  . Financial resource strain: Not on file  . Food insecurity    Worry: Not on file    Inability: Not on file  . Transportation needs    Medical: Not on file    Non-medical: Not on file  Tobacco Use  . Smoking status: Never Smoker  . Smokeless tobacco: Never Used  Substance and Sexual Activity  . Alcohol use: No  . Drug use: No  . Sexual activity: Never  Lifestyle  . Physical activity    Days per week: Not on file    Minutes per session: Not on file  . Stress: Not on file  Relationships  . Social Herbalist on phone: Not on file    Gets together: Not on file    Attends religious service: Not on file    Active member of club or organization: Not on file    Attends meetings of clubs or organizations: Not on file    Relationship status: Not on file  . Intimate partner violence    Fear of current or  ex partner: Not on file    Emotionally abused: Not on file    Physically abused: Not on file    Forced sexual activity: Not on file  Other Topics Concern  . Not on file  Social History Narrative  . Not on file    Current Outpatient Medications on File Prior to Visit  Medication Sig Dispense Refill  . aspirin 81 MG tablet Take 1 tablet (81 mg total) by mouth daily. 30 tablet   . carvedilol (COREG) 6.25 MG tablet TAKE 1 TABLET(6.25 MG) BY MOUTH TWICE DAILY (Patient taking differently: Take 6.25 mg by mouth 2 (two) times daily with a meal. ) 180 tablet 2  . Cholecalciferol (VITAMIN D3) 50 MCG (2000 UT) TABS Take 1 tablet by mouth daily.    Marland Kitchen escitalopram (LEXAPRO) 5 MG tablet Take 1 tablet (5 mg total) by mouth daily. 30 tablet 3  . insulin aspart (NOVOLOG FLEXPEN) 100 UNIT/ML FlexPen Inject 2 Units into the skin 3 (  three) times daily with meals. Take only if your blood sugar is over 400. (Patient taking differently: Inject 0-4 Units into the skin 3 (three) times daily with meals. Take when blood sugars are elevated) 15 mL 11  . Insulin Pen Needle (B-D UF III MINI PEN NEEDLES) 31G X 5 MM MISC use to inject insulin daily 100 each 0  . lisinopril (ZESTRIL) 10 MG tablet Take 1 tablet (10 mg total) by mouth daily. 90 tablet 1  . Multiple Vitamins-Minerals (CENTRUM SILVER ULTRA MENS PO) Take 1 tablet by mouth daily.     . Pancrelipase, Lip-Prot-Amyl, (CREON) 24000-76000 units CPEP TAKE 2 CAPSULES WITH EACH MEAL AND 1 CAPSULE WITH SMALL SNACKS (10 CAPS PER DAY) (Patient not taking: Reported on 08/16/2019) 810 capsule 0  . pantoprazole (PROTONIX) 40 MG tablet Take 1 tablet (40 mg total) by mouth 2 (two) times daily before a meal. 180 tablet 0  . rosuvastatin (CRESTOR) 40 MG tablet TAKE 1 TABLET(40 MG) BY MOUTH AT BEDTIME (Patient taking differently: Take 40 mg by mouth at bedtime. ) 30 tablet 6   No current facility-administered medications on file prior to visit.     Allergies  Allergen Reactions   . Lipitor [Atorvastatin] Other (See Comments)    Muscle weakness  . Metformin And Related Diarrhea    Severe diarrhea    Family History  Problem Relation Age of Onset  . Diabetes Mother   . Diabetes Paternal Grandmother   . Diabetes Maternal Grandfather   . Colon cancer Neg Hx     There were no vitals taken for this visit.   Review of Systems    Objective:   Physical Exam     Assessment & Plan:  DM, due to panc insuff: he needs increased rx Hypoglycemia: this limits aggressiveness of glycemic control.    Patient Instructions  Please increase the lantus to 68 units each morning.  To prevent the blood sugar from going low in the morning, you should eat a snack at bedtime.   check your blood sugar twice a day.  vary the time of day when you check, between before the 3 meals, and at bedtime.  also check if you have symptoms of your blood sugar being too high or too low.  please keep a record of the readings and bring it to your next appointment here (or you can bring the meter itself).  You can write it on any piece of paper.  please call us sooner if your blood sugar goes below 70, or if you have a lot of readings over 200. Please come back for a follow-up appointment in 2 months.

## 2019-08-16 ENCOUNTER — Encounter: Payer: Self-pay | Admitting: Cardiovascular Disease

## 2019-08-16 ENCOUNTER — Telehealth (INDEPENDENT_AMBULATORY_CARE_PROVIDER_SITE_OTHER): Payer: Medicare Other | Admitting: Cardiovascular Disease

## 2019-08-16 VITALS — BP 148/82 | HR 80 | Ht 72.0 in | Wt 190.0 lb

## 2019-08-16 DIAGNOSIS — I10 Essential (primary) hypertension: Secondary | ICD-10-CM | POA: Diagnosis not present

## 2019-08-16 DIAGNOSIS — I739 Peripheral vascular disease, unspecified: Secondary | ICD-10-CM | POA: Diagnosis not present

## 2019-08-16 DIAGNOSIS — I5042 Chronic combined systolic (congestive) and diastolic (congestive) heart failure: Secondary | ICD-10-CM | POA: Diagnosis not present

## 2019-08-16 DIAGNOSIS — E7800 Pure hypercholesterolemia, unspecified: Secondary | ICD-10-CM

## 2019-08-16 DIAGNOSIS — I2510 Atherosclerotic heart disease of native coronary artery without angina pectoris: Secondary | ICD-10-CM

## 2019-08-16 DIAGNOSIS — K8681 Exocrine pancreatic insufficiency: Secondary | ICD-10-CM

## 2019-08-16 DIAGNOSIS — R002 Palpitations: Secondary | ICD-10-CM

## 2019-08-16 DIAGNOSIS — E108 Type 1 diabetes mellitus with unspecified complications: Secondary | ICD-10-CM

## 2019-08-16 DIAGNOSIS — K922 Gastrointestinal hemorrhage, unspecified: Secondary | ICD-10-CM

## 2019-08-16 NOTE — Progress Notes (Signed)
Virtual Visit via Telephone Note   This visit type was conducted due to national recommendations for restrictions regarding the COVID-19 Pandemic (e.g. social distancing) in an effort to limit this patient's exposure and mitigate transmission in our community.  Due to his co-morbid illnesses, this patient is at least at moderate risk for complications without adequate follow up.  This format is felt to be most appropriate for this patient at this time.  The patient did not have access to video technology/had technical difficulties with video requiring transitioning to audio format only (telephone).  All issues noted in this document were discussed and addressed.  No physical exam could be performed with this format.  Please refer to the patient's chart for his  consent to telehealth for [facility].   Date:  08/16/2019   ID:  David, Lane [birth year], MRN [id number]  Patient Location: Home Provider Location: Other:  [facility]  PCP:  Luetta Nutting, DO  Cardiologist:  [name], MD  Electrophysiologist:  None   Evaluation Performed:  Follow-Up Visit  Chief Complaint:  CAD, HTN  History of Present Illness:    David Lane is a 73 y.o. male with chronic combined systolic and diastolic heart failure due to ischemic cardiomyopathy (EF 35-40% by echo March 2019), coronary artery disease (anterior STEMI March 2019 with placement of DES in mid LAD artery, 80% restenosis in L-PDA stent and 75% stenosis distal to PDA stent), severe hypertension, brittle type I diabetes mellitus  and pancreatic exocrine insufficiency following severe gallstone pancreatitis, PAD with transmetatarsal amputation of the left foot, diabetic retinopathy requiring surgery to the left eye, diabetic neuropathy, upper GI bleeding requiring transfusion in July 2020 (etiology not identified).  He has no recent cardiovascular complaints, but was seen in the emergency room on 08/09/2019 with  complaints of nausea (no vomiting, abdominal pain, chest pain or dyspnea).  He felt nauseous and was mildly diaphoretic.  He checked his blood pressure repeatedly and watched it steadily go up to a maximum of 167/90 so he called EMS.  In the emergency room he was found to have an EKG without evidence of acute ischemic changes (has old inferior and anterior septal infarction, but no acute ST-T abnormalities), very low high-sensitivity troponin levels, normal lipase and mostly normal liver function tests with the exception of ALT 62.  His symptoms resolved spontaneously and he was discharged.  Incidentally labs showed that his hemoglobin has increased to 12.5.  [name] continues to be riddled with anxiety.  He worries about every slight change in his blood pressure which he has been checking 6 or 7 times a day.  He read me out several days of multiple blood pressure recordings.  It sounds like normal, reactive changes in his blood pressure.  The blood pressure was never dangerously high or unusually low.  I told him that I expect the variations are related to changes in activity and a normal circadian pattern.  It was suggested to him that an arrhythmia monitor might be helpful in identifying a cause for variations in his heart rate and blood pressure.  I think that might be a good idea to alleviate his residual concerns, but I think his palpitations mostly represent sinus tachycardia.  Glycemic control continues to show a general pattern of improvement, most recently 8.2%.  While not ideal, this is substantially better than his past trend.  It sounds that typically when he is truly relaxed his blood pressure is usually in the 120s/60s-130s/70s.  He denies intermittent claudication and has not experienced leg edema, orthopnea, PND, dizziness or syncope.  He is  relatively sedentary but does not have exertional angina or exertional dyspnea with usual activity.  The patient does not have symptoms concerning  for COVID-19 infection (fever, chills, cough, or new shortness of breath).    Past Medical History:  Diagnosis Date   CAD (coronary artery disease)    last cath by [facility] DR.  Ashanti Ratti showing  some  disease involving LCX and small size of Diag    CHF exacerbation, due to diastolic dysfunction [id number]   Chronic systolic CHF (congestive heart failure), NYHA class 1 ([place]) 07/17/2012   GERD (gastroesophageal reflux disease)    Hepatic lesion 02/04/11   Hyperlipemia    Hypertension    Ischemic cardiomyopathy    EF 35-40%   Liver hemangioma    NSTEMI (non-ST elevated myocardial infarction) ([place]) 11/21/2009   Pancreatitis 2000's   Shortness of breath    Type II diabetes mellitus ([place])    Past Surgical History:  Procedure Laterality Date   AMPUTATION Left 01/14/2018   Procedure: TRANSMETATARSAL AMPUTATION;  Surgeon: Newt Minion, MD;  Location: [place];  Service: Orthopedics;  Laterality: Left;   BIOPSY  04/26/2019   Procedure: BIOPSY;  Surgeon: Irving Copas., MD;  [facility];  Service: Gastroenterology;;   CARDIAC CATHETERIZATION  2011   minimal disease, medical management   COLONOSCOPY WITH PROPOFOL N/A 04/26/2019   Procedure: COLONOSCOPY WITH PROPOFOL;  Surgeon: Rush Landmark Telford Nab., MD;  Location: [facility];  Service: Gastroenterology;  Laterality: N/A;   CORONARY ANGIOPLASTY WITH STENT PLACEMENT  07/14/2012   successful PCI & stenting of mid LAD & PDA off the dominant CX   CORONARY/GRAFT ACUTE MI REVASCULARIZATION N/A 12/20/2017   Procedure: Coronary/Graft Acute MI Revascularization;  Surgeon: Martinique, Peter M, MD;  Location: [place] [facility];  Service: Cardiovascular;  Laterality: N/A;   ENTEROSCOPY N/A 04/25/2019   Procedure: ENTEROSCOPY;  Surgeon: Rush Landmark Telford Nab., MD;  [facility];  Service: Gastroenterology;  Laterality: N/A;   ESOPHAGOGASTRODUODENOSCOPY Left 04/24/2019   Procedure: ESOPHAGOGASTRODUODENOSCOPY  (EGD);  Surgeon: Lavena Bullion, DO;  [facility];  Service: Gastroenterology;  Laterality: Left;   HEMOSTASIS CLIP PLACEMENT  04/26/2019   Procedure: HEMOSTASIS CLIP PLACEMENT;  Surgeon: Irving Copas., MD;  [facility];  Service: Gastroenterology;;   HOT HEMOSTASIS N/A 04/26/2019   Procedure: HOT HEMOSTASIS (ARGON PLASMA COAGULATION/BICAP);  Surgeon: Irving Copas., MD;  Location: [facility];  Service: Gastroenterology;  Laterality: N/A;   INCISION AND DRAINAGE ABSCESS ANAL  1970's   LEFT HEART CATH AND CORONARY ANGIOGRAPHY N/A 12/20/2017   Procedure: LEFT HEART CATH AND CORONARY ANGIOGRAPHY;  Surgeon: Martinique, Peter M, MD;  Location: [place] [facility];  Service: Cardiovascular;  Laterality: N/A;   LEFT HEART CATHETERIZATION WITH CORONARY ANGIOGRAM N/A 07/14/2012   Procedure: LEFT HEART CATHETERIZATION WITH CORONARY ANGIOGRAM;  Surgeon: [name], MD;  Location: [place] [facility];  Service: Cardiovascular;  Laterality: N/A;   PERCUTANEOUS CORONARY STENT INTERVENTION (PCI-S) Right 07/14/2012   Procedure: PERCUTANEOUS CORONARY STENT INTERVENTION (PCI-S);  Surgeon: [name], MD;  Location: [facility];  Service: Cardiovascular;  Laterality: Right;   POLYPECTOMY  04/26/2019   Procedure: POLYPECTOMY;  Surgeon: Rush Landmark Telford Nab., MD;  Location: [facility];  Service: Gastroenterology;;   SUBMUCOSAL TATTOO INJECTION  04/25/2019   Procedure: SUBMUCOSAL TATTOO INJECTION;  Surgeon: Irving Copas., MD;  [facility];  Service: Gastroenterology;;  Current Meds  Medication Sig   aspirin 81 MG tablet Take 1 tablet (81 mg total) by mouth daily.   carvedilol (COREG) 6.25 MG tablet TAKE 1 TABLET(6.25 MG) BY MOUTH TWICE DAILY (Patient taking differently: Take 6.25 mg by mouth 2 (two) times daily with a meal. )   Cholecalciferol (VITAMIN D3) 50 MCG (2000 UT) TABS Take 1 tablet by mouth daily.   escitalopram (LEXAPRO) 5 MG  tablet Take 1 tablet (5 mg total) by mouth daily.   glucose blood (ONE TOUCH ULTRA TEST) test strip USE 1 STRIP TO CHECK GLUCOSE 4 TIMES DAILY; K86.89, E10.8   insulin aspart (NOVOLOG FLEXPEN) 100 UNIT/ML FlexPen Inject 2 Units into the skin 3 (three) times daily with meals. Take only if your blood sugar is over 400. (Patient taking differently: Inject 0-4 Units into the skin 3 (three) times daily with meals. Take when blood sugars are elevated)   Insulin Glargine (LANTUS SOLOSTAR) 100 UNIT/ML Solostar Pen Inject 68 Units into the skin every morning. And pen needles 1/day   Insulin Pen Needle (B-D UF III MINI PEN NEEDLES) 31G X 5 MM MISC use to inject insulin daily   lisinopril (ZESTRIL) 10 MG tablet Take 1 tablet (10 mg total) by mouth daily.   Multiple Vitamins-Minerals (CENTRUM SILVER ULTRA MENS PO) Take 1 tablet by mouth daily.    pantoprazole (PROTONIX) 40 MG tablet Take 1 tablet (40 mg total) by mouth 2 (two) times daily before a meal.   rosuvastatin (CRESTOR) 40 MG tablet TAKE 1 TABLET(40 MG) BY MOUTH AT BEDTIME (Patient taking differently: Take 40 mg by mouth at bedtime. )     Allergies:   Lipitor [atorvastatin] and Metformin and related   Social History   Tobacco Use   Smoking status: Never Smoker   Smokeless tobacco: Never Used  Substance Use Topics   Alcohol use: No   Drug use: No     Family Hx: The patient's family history includes Diabetes in his maternal grandfather, mother, and paternal grandmother. There is no history of Colon cancer.  ROS:   Please see the history of present illness.    All other systems are reviewed and are negative.   Prior CV studies:   The following studies were reviewed today: Notes from his visit with Dr. Loanne Drilling yesterday and his previous visit with his primary care provider, Dr. Zigmund Daniel Records from recent ED visit including review of ECG and labs  Labs/Other Tests and Data Reviewed:    EKG:  An ECG dated 08/09/2019 was  personally reviewed today and demonstrated:  Sinus rhythm with left axis deviation and old Q waves in leads V1-V3 and in the inferior leads.  No acute ST-T changes  Recent Labs: 10/06/2018: TSH 1.94 08/09/2019: ALT 69; BUN 12; Creatinine, Ser 0.93; Hemoglobin 12.5; Platelets 227; Potassium 4.8; Sodium 136   Recent Lipid Panel Lab Results  Component Value Date/Time   CHOL 102 12/21/2017 03:45 AM   TRIG 70 12/21/2017 03:45 AM   HDL 24 (L) 12/21/2017 03:45 AM   CHOLHDL 4.3 12/21/2017 03:45 AM   LDLCALC 64 12/21/2017 03:45 AM    Wt Readings from Last 3 Encounters:  08/16/19 190 lb (86.2 kg)  06/20/19 193 lb (87.5 kg)  06/12/19 193 lb (87.5 kg)     Objective:    Vital Signs:  BP (!) 148/82    Pulse 80    Ht 6' (1.829 m)    Wt 190 lb (86.2 kg)    BMI 25.77 kg/m  VITAL SIGNS:  reviewed unable to examine  ASSESSMENT & PLAN:    1. CHF: Most recent echo in March 2019 showed left ventricular ejection fraction of 35-40%, with wall motion abnormalities primarily in the distribution of the mid LAD artery and apical akinesis.  Despite depressed left ventricular systolic function he does not have symptoms of heart failure and has not required loop diuretics.  Continue beta-blockers and ACE inhibitors. 2. CAD: On highly effective statin, aspirin, carvedilol.  Asymptomatic. 3. HTN: He checks his blood pressure way too many times a day.  I think his current blood pressure readings are optimal and I recommended that he check his blood pressure only once a day, at rest, when he is fully relaxed not in any distress. 4. PAD: Denies intermittent claudication.  He has recovered well following his transmetatarsal amputation.  He has not had falls.  Previous vascular ultrasound suggested that he has small vessel disease. 5. HLP: Rosuvastatin maximum dose.  Most recent LDL cholesterol was within target range.  Has always had a very low HDL.  He has a follow-up visit with Dr. Zigmund Daniel next month and I think  they will be an optimal opportunity to recheck his lipid profile and to reevaluate the mildly abnormal transaminase test. 6. DM: Dose of Lantus increased slightly at his last appointment Dr. Loanne Drilling.  Hemoglobin A1c is not optimal, but is amongst the best he has had in several years. 7. Pancr. Insuff: He has evidence of chronic pancreatitis and probably has some degree of malabsorption. 8. Anemia/upper GI bleed: Hemoglobin is almost back to fully normal range. 9. Palpitations: Check event monitor, especially since he has depressed LVEF.  However, my suspicion is that he has sinus tachycardia occasionally, especially when he is anxious.   COVID-19 Education: The signs and symptoms of COVID-19 were discussed with the patient and how to seek care for testing (follow up with PCP or arrange E-visit).  The importance of social distancing was discussed today.  Time:   Today, I have spent 24 minutes with the patient with telehealth technology discussing the above problems.     Medication Adjustments/Labs and Tests Ordered: Current medicines are reviewed at length with the patient today.  Concerns regarding medicines are outlined above.   Tests Ordered: Orders Placed This Encounter  Procedures   Cardiac event monitor    Medication Changes: No orders of the defined types were placed in this encounter.   Follow Up:  Virtual Visit or In Person 6 months  Signed, [name], MD  08/16/2019 12:19 PM    [place]

## 2019-08-16 NOTE — Patient Instructions (Signed)
Medication Instructions:   Your physician recommends that you continue on your current medications as directed. Please refer to the Current Medication list given to you today.  *If you need a refill on your cardiac medications before your next appointment, please call your pharmacy*  Lab Work:  None ordered today  Testing/Procedures:  Your physician has recommended that you wear an event monitor for 14 days. Event monitors are medical devices that record the heart's electrical activity. Doctors most often [place] these monitors to diagnose arrhythmias. Arrhythmias are problems with the speed or rhythm of the heartbeat. The monitor is a small, portable device. You can wear one while you do your normal daily activities. This is usually used to diagnose what is causing palpitations/syncope (passing out).   Follow-Up: At [facility], you and your health needs are our priority.  As part of our continuing mission to provide you with exceptional heart care, we have created designated Provider Care Teams.  These Care Teams include your primary Cardiologist (physician) and Advanced Practice Providers (APPs -  Physician Assistants and Nurse Practitioners) who all work together to provide you with the care you need, when you need it.  Your next appointment:   6 month(s)  The format for your next appointment:   In Person  Provider:   You may see [name], MD or one of the following Advanced Practice Providers on your designated Care Team:    Almyra Deforest, PA-C  Fabian Sharp, [place] or   Roby Lofts, [place]   Other Instructions [employer] Cardiac Event Monitor Instructions Your physician has requested you wear your cardiac event monitor for 14 days, (1-30). [employer] may call or text to confirm a shipping address. The monitor will be sent to a land address via UPS. [employer] will not ship a monitor to a PO BOX. It typically takes 3-5 days to receive your monitor after it has been enrolled.  [employer] will assist with USPS tracking if your package is delayed. The telephone number for [employer] is [phone]. Once you have received your monitor, please review the enclosed instructions. Instruction tutorials can also be viewed under help and settings on the enclosed cell phone. Your monitor has already been registered assigning a specific monitor serial # to you.  Applying the monitor Remove cell phone from case and turn it on. The cell phone works as Dealer and needs to be within [employer] of you at all times. The cell phone will need to be charged on a daily basis. We recommend you plug the cell phone into the enclosed charger at your bedside table every night.  Monitor batteries: You will receive two monitor batteries labelled #1 and #2. These are your recorders. Plug battery #2 onto the second connection on the enclosed charger. Keep one battery on the charger at all times. This will keep the monitor battery deactivated. It will also keep it fully charged for when you need to switch your monitor batteries. A small light will be blinking on the battery emblem when it is charging. The light on the battery emblem will remain on when the battery is fully charged.  Open package of a Monitor strip. Insert battery #1 into black hood on strip and gently squeeze monitor battery onto connection as indicated in instruction booklet. Set aside while preparing skin.  Choose location for your strip, vertical or horizontal, as indicated in the instruction booklet. Shave to remove all hair from location. There cannot be any lotions, oils, powders, or colognes on  skin where monitor is to be applied. Wipe skin clean with enclosed Saline wipe. Dry skin completely.  Peel paper labeled #1 off the back of the Monitor strip exposing the adhesive. Place the monitor on the chest in the vertical or horizontal position shown in the instruction booklet. One arrow on the monitor strip must  be pointing upward. Carefully remove paper labeled #2, attaching remainder of strip to your skin. Try not to create any folds or wrinkles in the strip as you apply it.  Firmly press and release the circle in the center of the monitor battery. You will hear a small beep. This is turning the monitor battery on. The heart emblem on the monitor battery will light up every 5 seconds if the monitor battery in turned on and connected to the patient securely. Do not push and hold the circle down as this turns the monitor battery off. The cell phone will locate the monitor battery. A screen will appear on the cell phone checking the connection of your monitor strip. This may read poor connection initially but change to good connection within the next minute. Once your monitor accepts the connection you will hear a series of 3 beeps followed by a climbing crescendo of beeps. A screen will appear on the cell phone showing the two monitor strip placement options. Touch the picture that demonstrates where you applied the monitor strip.  Your monitor strip and battery are waterproof. You are able to shower, bathe, or swim with the monitor on. They just ask you do not submerge deeper than 3 feet underwater. We recommend removing the monitor if you are swimming in a lake, river, or ocean.  Your monitor battery will need to be switched to a fully charged monitor battery approximately once a week. The cell phone will alert you of an action which needs to be made.  On the cell phone, tap for details to reveal connection status, monitor battery status, and cell phone battery status. The green dots indicates your monitor is in good status. A red dot indicates there is something that needs your attention.  To record a symptom, click the circle on the monitor battery. In 30-60 seconds a list of symptoms will appear on the cell phone. Select your symptom and tap save. Your monitor will record a sustained or  significant arrhythmia regardless of you clicking the button. Some patients do not feel the heart rhythm irregularities. [employer] will notify us of any serious or critical events.  Refer to instruction booklet for instructions on switching batteries, changing strips, the Do not disturb or Pause features, or any additional questions.  Call [employer] at [phone], to confirm your monitor is transmitting and record your baseline. They will answer any questions you may have regarding the monitor instructions at that time.  Returning the monitor to [place] all equipment back into blue box. Peel off strip of paper to expose adhesive and close box securely. There is a prepaid UPS shipping label on this box. Drop in a UPS drop box, or at a UPS facility like [employer]. You may also contact [employer] to arrange UPS to pick up monitor package at your home.

## 2019-08-17 ENCOUNTER — Other Ambulatory Visit: Payer: Self-pay | Admitting: Endocrinology

## 2019-08-17 DIAGNOSIS — E089 Diabetes mellitus due to underlying condition without complications: Secondary | ICD-10-CM

## 2019-08-17 DIAGNOSIS — K8689 Other specified diseases of pancreas: Secondary | ICD-10-CM

## 2019-08-17 MED ORDER — GLUCOSE BLOOD VI STRP: ORAL_STRIP | 11 refills | Status: DC

## 2019-08-17 NOTE — Telephone Encounter (Signed)
RX sent to the requested pharmacy.

## 2019-08-17 NOTE — Telephone Encounter (Signed)
MEDICATION: glucose blood (ONE TOUCH ULTRA TEST) test strip-Blue  PHARMACY:  [facility] in [place], Ph# [phone]  IS THIS A 90 DAY SUPPLY : Yes  IS PATIENT OUT OF MEDICATION: No  IF NOT; HOW MUCH IS LEFT: 10  LAST APPOINTMENT DATE: @11 /18/2020  NEXT APPOINTMENT DATE:@1 /20/2021  DO WE HAVE YOUR PERMISSION TO LEAVE A DETAILED MESSAGE: yes  OTHER COMMENTS:    **Let patient know to contact pharmacy at the end of the day to make sure medication is ready. **  ** Please notify patient to allow 48-72 hours to process**  **Encourage patient to contact the pharmacy for refills or they can request refills through [facility]**

## 2019-08-21 ENCOUNTER — Emergency Department (HOSPITAL_COMMUNITY)
Admission: EM | Admit: 2019-08-21 | Discharge: 2019-08-21 | Disposition: A | Discharge status: Home / Self Care | Payer: Medicare Other | Attending: Emergency Medicine | Admitting: Emergency Medicine

## 2019-08-21 ENCOUNTER — Encounter (HOSPITAL_COMMUNITY): Payer: Self-pay

## 2019-08-21 DIAGNOSIS — R61 Generalized hyperhidrosis: Secondary | ICD-10-CM | POA: Insufficient documentation

## 2019-08-21 DIAGNOSIS — E119 Type 2 diabetes mellitus without complications: Secondary | ICD-10-CM | POA: Diagnosis not present

## 2019-08-21 DIAGNOSIS — I5042 Chronic combined systolic (congestive) and diastolic (congestive) heart failure: Secondary | ICD-10-CM | POA: Diagnosis not present

## 2019-08-21 DIAGNOSIS — R42 Dizziness and giddiness: Secondary | ICD-10-CM | POA: Insufficient documentation

## 2019-08-21 DIAGNOSIS — R231 Pallor: Secondary | ICD-10-CM | POA: Diagnosis not present

## 2019-08-21 DIAGNOSIS — I2510 Atherosclerotic heart disease of native coronary artery without angina pectoris: Secondary | ICD-10-CM | POA: Insufficient documentation

## 2019-08-21 DIAGNOSIS — I10 Essential (primary) hypertension: Secondary | ICD-10-CM | POA: Diagnosis not present

## 2019-08-21 LAB — CBC WITH DIFFERENTIAL/PLATELET
Abs Immature Granulocytes: 0.12 10*3/uL — ABNORMAL HIGH (ref 0.00–0.07)
Basophils Absolute: 0 10*3/uL (ref 0.0–0.1)
Basophils Relative: 0 %
Eosinophils Absolute: 0.2 10*3/uL (ref 0.0–0.5)
Eosinophils Relative: 2 %
HCT: 41.9 % (ref 39.0–52.0)
Hemoglobin: 13.9 g/dL (ref 13.0–17.0)
Immature Granulocytes: 1 %
Lymphocytes Relative: 22 %
Lymphs Abs: 2.3 10*3/uL (ref 0.7–4.0)
MCH: 30.5 pg (ref 26.0–34.0)
MCHC: 33.2 g/dL (ref 30.0–36.0)
MCV: 92.1 fL (ref 80.0–100.0)
Monocytes Absolute: 0.9 10*3/uL (ref 0.1–1.0)
Monocytes Relative: 9 %
Neutro Abs: 7 10*3/uL (ref 1.7–7.7)
Neutrophils Relative %: 66 %
Platelets: 236 10*3/uL (ref 150–400)
RBC: 4.55 MIL/uL (ref 4.22–5.81)
RDW: 13 % (ref 11.5–15.5)
WBC: 10.5 10*3/uL (ref 4.0–10.5)
nRBC: 0 % (ref 0.0–0.2)

## 2019-08-21 LAB — URINALYSIS, ROUTINE W REFLEX MICROSCOPIC
Bilirubin Urine: NEGATIVE
Glucose, UA: 50 mg/dL — AB
Hgb urine dipstick: NEGATIVE
Ketones, ur: NEGATIVE mg/dL
Leukocytes,Ua: NEGATIVE
Nitrite: NEGATIVE
Protein, ur: NEGATIVE mg/dL
Specific Gravity, Urine: 1.024 (ref 1.005–1.030)
pH: 5 (ref 5.0–8.0)

## 2019-08-21 LAB — TROPONIN I (HIGH SENSITIVITY)
Troponin I (High Sensitivity): 6 ng/L (ref <18)
Troponin I (High Sensitivity): 6 ng/L (ref <18)

## 2019-08-21 LAB — TSH: TSH: 2.426 u[IU]/mL (ref 0.350–4.500)

## 2019-08-21 LAB — COMPREHENSIVE METABOLIC PANEL
ALT: 47 U/L — ABNORMAL HIGH (ref 0–44)
AST: 28 U/L (ref 15–41)
Albumin: 3.8 g/dL (ref 3.5–5.0)
Alkaline Phosphatase: 98 U/L (ref 38–126)
Anion gap: 9 (ref 5–15)
BUN: 24 mg/dL — ABNORMAL HIGH (ref 8–23)
CO2: 24 mmol/L (ref 22–32)
Calcium: 8.7 mg/dL — ABNORMAL LOW (ref 8.9–10.3)
Chloride: 104 mmol/L (ref 98–111)
Creatinine, Ser: 1.25 mg/dL — ABNORMAL HIGH (ref 0.61–1.24)
GFR calc Af Amer: >60 mL/min (ref >60)
GFR calc non Af Amer: 58 mL/min — ABNORMAL LOW (ref >60)
Glucose, Bld: 54 mg/dL — ABNORMAL LOW (ref 70–99)
Potassium: 4.2 mmol/L (ref 3.5–5.1)
Sodium: 137 mmol/L (ref 135–145)
Total Bilirubin: 0.8 mg/dL (ref 0.3–1.2)
Total Protein: 7.5 g/dL (ref 6.5–8.1)

## 2019-08-21 LAB — CBG MONITORING, ED
Glucose-Capillary: 57 mg/dL — ABNORMAL LOW (ref 70–99)
Glucose-Capillary: 106 mg/dL — ABNORMAL HIGH (ref 70–99)
Glucose-Capillary: 143 mg/dL — ABNORMAL HIGH (ref 70–99)

## 2019-08-21 NOTE — ED Notes (Signed)
EDP made aware of CBG, pt given apple juice and graham crackers

## 2019-08-21 NOTE — ED Provider Notes (Signed)
Emergency Department Provider Note   I have reviewed the triage vital signs and the nursing notes.   HISTORY  Chief Complaint Dizziness   HPI David Lane is a 73 y.o. male with past medical history reviewed below presents to the emergency department with additional episode of diaphoresis with very mild, brief lightheadedness.  Patient states that he got up this morning and checked his blood sugar along with his blood pressure which were normal.  He took his 68 units of the Lantus as prescribed and went about his day.  He states he was doing some light cleaning and ate breakfast.  He was fixing his lunch later on and began to notice that he was diaphoretic.  His shirt was damp with sweat.  He denies any shaking chills or fevers.  He checked his blood sugar at that time which was 166.  He was not experiencing any chest pain, heart palpitations, shortness of breath.  Patient was seen in the emergency department approximately 2 weeks ago with similar symptoms.  He tells me he came up.  That time with normal blood work and was discharged home.  He has been following closely with his cardiologist.  Patient tells me that he saw his cardiologist and they are setting up some outpatient telemetry monitoring but that the monitor has not arrived as of yet.  He is experiencing no symptoms at this time. No radiation of symptoms or modifying factors.   Past Medical History:  Diagnosis Date  . CAD (coronary artery disease)    last cath by [facility] DR.  Mihai Croitoru showing  some  disease involving LCX and small size of Diag   . CHF exacerbation, due to diastolic dysfunction [id number]  . Chronic systolic CHF (congestive heart failure), NYHA class 1 ([place]) 07/17/2012  . GERD (gastroesophageal reflux disease)   . Hepatic lesion 02/04/11  . Hyperlipemia   . Hypertension   . Ischemic cardiomyopathy    EF 35-40%  . Liver hemangioma   . NSTEMI (non-ST elevated myocardial infarction) ([place]) 11/21/2009  .  Pancreatitis 2000's  . Shortness of breath   . Type II diabetes mellitus [facility])     Patient Active Problem List   Diagnosis Date Noted  . Coronary artery disease involving native coronary artery of native heart without angina pectoris 06/21/2019  . AVM (arteriovenous malformation) of colon   . History of colonic polyps   . Duodenitis   . Gastroesophageal reflux disease with esophagitis   . Acute upper GI bleed 04/23/2019  . Dyslipidemia (high LDL; low HDL) 07/01/2018  . Labile blood glucose   . Functional gait disorder 01/20/2018  . Acute blood loss anemia 01/20/2018  . Status post transmetatarsal amputation of left foot ([place]) 01/20/2018  . Foot ulcer due to secondary DM ([place]) 01/04/2018  . Status post coronary artery stent placement   . Acute ST elevation myocardial infarction (STEMI) involving left anterior descending (LAD) coronary artery ([place]) 12/20/2017  . 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 Wellness examination 11/25/2014  . Screening for prostate cancer 11/25/2014  . Pancreatic insufficiency 01/25/2013  . Ischemic cardiomyopathy, EF 50% by echo 08/01/13 07/19/2012  . Chronic combined systolic and diastolic CHF (congestive heart failure) ([place]) 07/14/2012  . Pulmonary edema, most likely due to diastolic dysfunction [id number]  . CAD, 07/14/12- LAD/PDA DES   . Anxiety 12/12/2009  . Hyperlipidemia 04/24/2007  . Essential hypertension 04/24/2007  . PANCREATITIS, HX OF 04/24/2007    Past Surgical History:  Procedure Laterality Date  . AMPUTATION Left 01/14/2018   Procedure: TRANSMETATARSAL AMPUTATION;  Surgeon: Newt Minion, MD;  Location: [facility];  Service: Orthopedics;  Laterality: Left;  . BIOPSY  04/26/2019   Procedure: BIOPSY;  Surgeon: Rush Landmark Telford Nab., MD;  Location: [facility];  Service: Gastroenterology;;  . CARDIAC CATHETERIZATION  2011   minimal disease, medical management  . COLONOSCOPY WITH PROPOFOL N/A 04/26/2019    Procedure: COLONOSCOPY WITH PROPOFOL;  Surgeon: Rush Landmark Telford Nab., MD;  Location: [facility];  Service: Gastroenterology;  Laterality: N/A;  . CORONARY ANGIOPLASTY WITH STENT PLACEMENT  07/14/2012   successful PCI & stenting of mid LAD & PDA off the dominant CX  . CORONARY/GRAFT ACUTE MI REVASCULARIZATION N/A 12/20/2017   Procedure: Coronary/Graft Acute MI Revascularization;  Surgeon: Martinique, Peter M, MD;  Location: [facility];  Service: Cardiovascular;  Laterality: N/A;  . ENTEROSCOPY N/A 04/25/2019   Procedure: ENTEROSCOPY;  Surgeon: Rush Landmark Telford Nab., MD;  Location: [facility];  Service: Gastroenterology;  Laterality: N/A;  . ESOPHAGOGASTRODUODENOSCOPY Left 04/24/2019   Procedure: ESOPHAGOGASTRODUODENOSCOPY (EGD);  Surgeon: Lavena Bullion, DO;  Location: [facility];  Service: Gastroenterology;  Laterality: Left;  . HEMOSTASIS CLIP PLACEMENT  04/26/2019   Procedure: HEMOSTASIS CLIP PLACEMENT;  Surgeon: Irving Copas., MD;  Location: [facility];  Service: Gastroenterology;;  . HOT HEMOSTASIS N/A 04/26/2019   Procedure: HOT HEMOSTASIS (ARGON PLASMA COAGULATION/BICAP);  Surgeon: Irving Copas., MD;  Location: [facility];  Service: Gastroenterology;  Laterality: N/A;  . INCISION AND DRAINAGE ABSCESS ANAL  1970's  . LEFT HEART CATH AND CORONARY ANGIOGRAPHY N/A 12/20/2017   Procedure: LEFT HEART CATH AND CORONARY ANGIOGRAPHY;  Surgeon: Martinique, Peter M, MD;  Location: [place] [facility];  Service: Cardiovascular;  Laterality: N/A;  . LEFT HEART CATHETERIZATION WITH CORONARY ANGIOGRAM N/A 07/14/2012   Procedure: LEFT HEART CATHETERIZATION WITH CORONARY ANGIOGRAM;  Surgeon: [name], MD;  Location: [facility];  Service: Cardiovascular;  Laterality: N/A;  . PERCUTANEOUS CORONARY STENT INTERVENTION (PCI-S) Right 07/14/2012   Procedure: PERCUTANEOUS CORONARY STENT INTERVENTION (PCI-S);  Surgeon: [name], MD;  Location: [facility];  Service:  Cardiovascular;  Laterality: Right;  . POLYPECTOMY  04/26/2019   Procedure: POLYPECTOMY;  Surgeon: Mansouraty, Telford Nab., MD;  Location: [facility];  Service: Gastroenterology;;  . Lia Foyer TATTOO INJECTION  04/25/2019   Procedure: SUBMUCOSAL TATTOO INJECTION;  Surgeon: Irving Copas., MD;  Location: [facility];  Service: Gastroenterology;;    Allergies Lipitor [atorvastatin] and Metformin and related  Family History  Problem Relation Age of Onset  . Diabetes Mother   . Diabetes Paternal Grandmother   . Diabetes Maternal Grandfather   . Colon cancer Neg Hx     Social History Social History   Tobacco Use  . Smoking status: Never Smoker  . Smokeless tobacco: Never Used  Substance Use Topics  . Alcohol use: No  . Drug use: No    Review of Systems  Constitutional: No fever/chills. Positive diaphoresis and momentary lightheadedness.  Eyes: No visual changes. ENT: No sore throat. Cardiovascular: Denies chest pain. Respiratory: Denies shortness of breath. Gastrointestinal: No abdominal pain.  No nausea, no vomiting.  No diarrhea.  No constipation. Genitourinary: Negative for dysuria. Musculoskeletal: Negative for back pain. Skin: Negative for rash. Neurological: Negative for headaches, focal weakness or numbness.  10-point ROS otherwise negative.  ____________________________________________   PHYSICAL EXAM:  VITAL SIGNS: ED Triage Vitals  Enc Vitals Group     BP 08/21/19 1642 130/84  Pulse Rate 08/21/19 1642 74     Resp 08/21/19 1642 16     Temp 08/21/19 1642 97.9 F (36.6 C)     Temp Source 08/21/19 1642 Oral     SpO2 08/21/19 1642 99 %   Constitutional: Alert and oriented. Well appearing and in no acute distress. Eyes: Conjunctivae are normal.  Head: Atraumatic. Nose: No congestion/rhinnorhea. Mouth/Throat: Mucous membranes are moist. Neck: No stridor.   Cardiovascular: Normal rate, regular rhythm. Good peripheral circulation. Grossly  normal heart sounds.   Respiratory: Normal respiratory effort.  No retractions. Lungs CTAB. Gastrointestinal: Soft and nontender. No distention.  Musculoskeletal: No lower extremity tenderness nor edema. No gross deformities of extremities. Neurologic:  Normal speech and language. No gross focal neurologic deficits are appreciated.  Skin:  Skin is warm, dry and intact. No rash noted.   ____________________________________________   LABS (all labs ordered are listed, but only abnormal results are displayed)  Labs Reviewed  COMPREHENSIVE METABOLIC PANEL - Abnormal; Notable for the following components:      Result Value   Glucose, Bld 54 (*)    BUN 24 (*)    Creatinine, Ser 1.25 (*)    Calcium 8.7 (*)    ALT 47 (*)    GFR calc non Af Amer 58 (*)    All other components within normal limits  CBC WITH DIFFERENTIAL/PLATELET - Abnormal; Notable for the following components:   Abs Immature Granulocytes 0.12 (*)    All other components within normal limits  URINALYSIS, ROUTINE W REFLEX MICROSCOPIC - Abnormal; Notable for the following components:   Glucose, UA 50 (*)    All other components within normal limits  CBG MONITORING, ED - Abnormal; Notable for the following components:   Glucose-Capillary 57 (*)    All other components within normal limits  CBG MONITORING, ED - Abnormal; Notable for the following components:   Glucose-Capillary 106 (*)    All other components within normal limits  CBG MONITORING, ED - Abnormal; Notable for the following components:   Glucose-Capillary 143 (*)    All other components within normal limits  TSH  CBG MONITORING, ED  CBG MONITORING, ED  TROPONIN I (HIGH SENSITIVITY)  TROPONIN I (HIGH SENSITIVITY)   ____________________________________________  EKG   EKG Interpretation  Date/Time:  Tuesday August 21 2019 16:46:56 EST Ventricular Rate:  77 PR Interval:    QRS Duration: 85 QT Interval:  375 QTC Calculation: 425 R Axis:   -55 Text  Interpretation: Sinus rhythm Prolonged PR interval Left anterior fascicular block Low voltage, precordial leads Anteroseptal infarct, old No STEMI Confirmed by Nanda Quinton [id number]) on 08/21/2019 5:32:08 PM       ____________________________________________  RADIOLOGY  None  ____________________________________________   PROCEDURES  Procedure(s) performed:   Procedures  None  ____________________________________________   INITIAL IMPRESSION / ASSESSMENT AND PLAN / ED COURSE  Pertinent labs & imaging results that were available during my care of the patient were reviewed by me and considered in my medical decision making (see chart for details).   Patient presents to the emergency department with additional diaphoresis with lightheadedness which was momentary.  He has no neuro deficits on exam.  He did not experience chest pain or palpitation.  My suspicion for primary cardiac event or arrhythmia is very low.  He has had some fluctuations in his blood sugar here in the emergency department.  I question whether or not some of this diaphoresis may be related to blood pressure swings.  Will monitor  here, repeat screening blood work.  EKG similar to prior.  No history of recent GI bleeding although has required transfusion in the past.   Labs and serial troponin reassuring. No additional hypoglycemia. Suspect that CBG mgmt at home may be contributing to diaphoresis. Patient to call PCP in the AM for set up a follow up appointment and drive additional testing. Telemetry (ambulatory) planned by Cardiology service. No indication at this time for admission or additional w/u. Discussed results and plan with the patient who is pleased at discharge.  ____________________________________________  FINAL CLINICAL IMPRESSION(S) / ED DIAGNOSES  Final diagnoses:  Lightheadedness  Diaphoresis    Note:  This document was prepared using Dragon voice recognition software and may include unintentional  dictation errors.  Nanda Quinton, MD, [facility] Emergency Medicine    Chanae Gemma, [facility], MD 08/21/19 2225

## 2019-08-21 NOTE — Discharge Instructions (Signed)
You were seen in the emergency department today with sweatiness and mild lightheadedness.  Your lab work and EKG were reassuring.  Please drink plenty of fluids.  Call your primary doctor first thing in the morning to review your symptoms and schedule a follow-up appointment.  Please return to the emergency department with any new or suddenly worsening symptoms.

## 2019-08-21 NOTE — ED Notes (Signed)
ED Provider at bedside. 

## 2019-08-21 NOTE — ED Triage Notes (Signed)
Pt BIB [facility] from Home with sudden onset of lightheadedness and diaphoresis at rest 1-2 hrs ago. No CP and no other complaints. Here last week for similar occurrence. No fall and no LOC  Pt warm, pink, and dry currently.  VSS with EMS: CBG: 91 SPO2: 100 RA HR: 82 BP: 152/84

## 2019-08-22 ENCOUNTER — Ambulatory Visit: Payer: Self-pay | Admitting: *Deleted

## 2019-08-22 NOTE — Telephone Encounter (Signed)
Patient was seen for dizziness yesterday- his glucose was low yesterday. Patient had sweating and EMT took to ED. Patient BP 117/70, glucose 109 this morning. Patient started feeling flushed and sweating- BP 181/100 glucose 79.Patient has drank OJ and glucose tablets - while waiting for nurse- 171/93 and patient has stopped sweating. Glucose level now- 1:00 pm is 86 and BP- 176/93 P 80. Call to PCP- patient was instructed to call for follow up. Due to symptoms advised to call endocrinologist. Patient reports he has had recent visit-with endocrinology several days ago and his medication was increased back to Lantus 68 units- and he feels it is having adverse effects. Patient is very frustrated and feels no one is concerned about his symptoms and numbers. Patient feels like a ping pong ball- he would like an appointment for follow up to his ED visit.  Reason for Disposition . [1] Caller has URGENT medication or insulin pump question AND [2] triager unable to answer question  Answer Assessment - Initial Assessment Questions 1. SYMPTOMS: "What symptoms are you concerned about?"     Low glucose level- 79 this morning 2. ONSET:  "When did the symptoms start?"     On/off symptoms 1-2 weeks 3. BLOOD GLUCOSE: "What is your blood glucose level?"      79 this morning 4. USUAL RANGE: "What is your blood glucose level usually?" (e.g., usual fasting morning value, usual evening value)     fasting- 190-129- the symptoms mid morning and afternoon is when patient has episodes  5. TYPE 1 or 2:  "Do you know what type of diabetes you have?"  (e.g., Type 1, Type 2, Gestational; doesn't know)      Type 2 6. INSULIN: "Do you take insulin?" "What type of insulin(s) do you use? What is the mode of delivery? (syringe, pen; injection or pump) "When did you last give yourself an insulin dose?" (i.e., time or hours/minutes ago) "How much did you give?" (i.e., how many units)     Nova log, Lantus 7. DIABETES PILLS: "Do you  take any pills for your diabetes?"     no 8. OTHER SYMPTOMS: "Do you have any symptoms?" (e.g., fever, frequent urination, difficulty breathing, vomiting)     sweating 9. LOW BLOOD GLUCOSE TREATMENT: "What have you done so far to treat the low blood glucose level?"     OJ and glucose tablet 10. FOOD: "When did you last eat or drink?"       12:30- OJ/glucose tablet, breakfast- 9:00 11. ALONE: "Are you alone right now or is someone with you?"        yes 12. PREGNANCY: "Is there any chance you are pregnant?" "When was your last menstrual period?"       n/a  Protocols used: DIABETES - LOW BLOOD SUGAR-A-AH

## 2019-08-27 ENCOUNTER — Ambulatory Visit (INDEPENDENT_AMBULATORY_CARE_PROVIDER_SITE_OTHER): Payer: Medicare Other | Admitting: Endocrinology

## 2019-08-27 ENCOUNTER — Encounter: Payer: Self-pay | Admitting: Endocrinology

## 2019-08-27 VITALS — BP 122/78 | HR 90 | Temp 98.3°F | Ht 72.0 in | Wt 193.0 lb

## 2019-08-27 DIAGNOSIS — K8689 Other specified diseases of pancreas: Secondary | ICD-10-CM

## 2019-08-27 DIAGNOSIS — E089 Diabetes mellitus due to underlying condition without complications: Secondary | ICD-10-CM | POA: Diagnosis not present

## 2019-08-27 LAB — GLUCOSE, POCT (MANUAL RESULT ENTRY): POC Glucose: 235 mg/dl — AB (ref 70–99)

## 2019-08-27 LAB — POCT GLYCOSYLATED HEMOGLOBIN (HGB A1C): Hemoglobin A1C: 8.1 % — AB (ref 4.0–5.6)

## 2019-08-27 MED ORDER — LANTUS SOLOSTAR 100 UNIT/ML ~~LOC~~ SOPN: 66.0000 [IU] | PEN_INJECTOR | SUBCUTANEOUS | 11 refills | Status: DC

## 2019-08-27 NOTE — Progress Notes (Signed)
Subjective:    Patient ID: David Lane, male    DOB: 06-03-1946, 73 y.o.   MRN: [id number]  HPI Pt returns for f/u of diabetes mellitus: DM type: due to pancreatic insufficiency Dx'ed: 1988, during an episode of pancreatitis.   Complications: CAD, DR, PAD, left transmetatarsal amputation, and PN.  Therapy: insulin since 1990.   DKA: never.   Severe hypoglycemia: last episode was in 2014; he says this was probably due to taking more than the prescribed insulin dosage.  Other: in 2014, he was changed to qd lantus, for further simplification of his insulin schedule.  V-GO pump was considered, but pt twice did not keep appt for it; he is retired.   Interval history: no cbg record, but states cbg's vary from 65-300.  He has mild hypoglycemia approx twice per week.   He was recently seen in ER with diaphoresis and lightheadedness.  cbg was 166 then.  He seldom takes novolog.  However, he sometimes takes Novolog for a cbg in the 200's Past Medical History:  Diagnosis Date  . CAD (coronary artery disease)    last cath by [facility] DR.  Mihai Croitoru showing  some  disease involving LCX and small size of Diag   . CHF exacerbation, due to diastolic dysfunction A999333  . Chronic systolic CHF (congestive heart failure), NYHA class 1 ([place]) 07/17/2012  . GERD (gastroesophageal reflux disease)   . Hepatic lesion 02/04/11  . Hyperlipemia   . Hypertension   . Ischemic cardiomyopathy    EF 35-40%  . Liver hemangioma   . NSTEMI (non-ST elevated myocardial infarction) ([place]) 11/21/2009  . Pancreatitis 2000's  . Shortness of breath   . Type II diabetes mellitus ([place])     Past Surgical History:  Procedure Laterality Date  . AMPUTATION Left 01/14/2018   Procedure: TRANSMETATARSAL AMPUTATION;  Surgeon: Newt Minion, MD;  Location: [place];  Service: Orthopedics;  Laterality: Left;  . BIOPSY  04/26/2019   Procedure: BIOPSY;  Surgeon: Rush Landmark Telford Nab., MD;  Location: [facility];  Service:  Gastroenterology;;  . CARDIAC CATHETERIZATION  2011   minimal disease, medical management  . COLONOSCOPY WITH PROPOFOL N/A 04/26/2019   Procedure: COLONOSCOPY WITH PROPOFOL;  Surgeon: Rush Landmark Telford Nab., MD;  Location: [facility];  Service: Gastroenterology;  Laterality: N/A;  . CORONARY ANGIOPLASTY WITH STENT PLACEMENT  07/14/2012   successful PCI & stenting of mid LAD & PDA off the dominant CX  . CORONARY/GRAFT ACUTE MI REVASCULARIZATION N/A 12/20/2017   Procedure: Coronary/Graft Acute MI Revascularization;  Surgeon: Martinique, Peter M, MD;  Location: [facility] [facility];  Service: Cardiovascular;  Laterality: N/A;  . ENTEROSCOPY N/A 04/25/2019   Procedure: ENTEROSCOPY;  Surgeon: Rush Landmark Telford Nab., MD;  [facility];  Service: Gastroenterology;  Laterality: N/A;  . ESOPHAGOGASTRODUODENOSCOPY Left 04/24/2019   Procedure: ESOPHAGOGASTRODUODENOSCOPY (EGD);  Surgeon: Lavena Bullion, DO;  [facility];  Service: Gastroenterology;  Laterality: Left;  . HEMOSTASIS CLIP PLACEMENT  04/26/2019   Procedure: HEMOSTASIS CLIP PLACEMENT;  Surgeon: Irving Copas., MD;  [facility];  Service: Gastroenterology;;  . HOT HEMOSTASIS N/A 04/26/2019   Procedure: HOT HEMOSTASIS (ARGON PLASMA COAGULATION/BICAP);  Surgeon: Irving Copas., MD;  Location: [facility];  Service: Gastroenterology;  Laterality: N/A;  . INCISION AND DRAINAGE ABSCESS ANAL  1970's  . LEFT HEART CATH AND CORONARY ANGIOGRAPHY N/A 12/20/2017   Procedure: LEFT HEART CATH AND CORONARY ANGIOGRAPHY;  Surgeon: Martinique, Peter M, MD;  Location: [place] [facility];  Service: Cardiovascular;  Laterality: N/A;  . LEFT HEART CATHETERIZATION WITH CORONARY ANGIOGRAM N/A 07/14/2012   Procedure: LEFT HEART CATHETERIZATION WITH CORONARY ANGIOGRAM;  Surgeon: [name], MD;  Location: [facility];  Service: Cardiovascular;  Laterality: N/A;  . PERCUTANEOUS CORONARY STENT INTERVENTION (PCI-S) Right  07/14/2012   Procedure: PERCUTANEOUS CORONARY STENT INTERVENTION (PCI-S);  Surgeon: [name], MD;  Location: [facility];  Service: Cardiovascular;  Laterality: Right;  . POLYPECTOMY  04/26/2019   Procedure: POLYPECTOMY;  Surgeon: Mansouraty, Telford Nab., MD;  Location: [facility];  Service: Gastroenterology;;  . Lia Foyer TATTOO INJECTION  04/25/2019   Procedure: SUBMUCOSAL TATTOO INJECTION;  Surgeon: Irving Copas., MD;  Location: [facility];  Service: Gastroenterology;;    Social History   Socioeconomic History  . Marital status: Single    Spouse name: Not on file  . Number of children: 0  . Years of education: Not on file  . Highest education level: Not on file  Occupational History  . Occupation: retired    Fish farm manager: [employer]  Social Needs  . Financial resource strain: Not on file  . Food insecurity    Worry: Not on file    Inability: Not on file  . Transportation needs    Medical: Not on file    Non-medical: Not on file  Tobacco Use  . Smoking status: Never Smoker  . Smokeless tobacco: Never Used  Substance and Sexual Activity  . Alcohol use: No  . Drug use: No  . Sexual activity: Never  Lifestyle  . Physical activity    Days per week: Not on file    Minutes per session: Not on file  . Stress: Not on file  Relationships  . Social Herbalist on phone: Not on file    Gets together: Not on file    Attends religious service: Not on file    Active member of club or organization: Not on file    Attends meetings of clubs or organizations: Not on file    Relationship status: Not on file  . Intimate partner violence    Fear of current or ex partner: Not on file    Emotionally abused: Not on file    Physically abused: Not on file    Forced sexual activity: Not on file  Other Topics Concern  . Not on file  Social History Narrative  . Not on file    Current Outpatient Medications on File Prior to Visit  Medication Sig Dispense Refill   . aspirin 81 MG tablet Take 1 tablet (81 mg total) by mouth daily. 30 tablet   . carvedilol (COREG) 6.25 MG tablet TAKE 1 TABLET(6.25 MG) BY MOUTH TWICE DAILY (Patient taking differently: Take 6.25 mg by mouth 2 (two) times daily with a meal. ) 180 tablet 2  . Cholecalciferol (VITAMIN D3) 50 MCG (2000 UT) TABS Take 1 tablet by mouth daily.    Marland Kitchen escitalopram (LEXAPRO) 5 MG tablet Take 1 tablet (5 mg total) by mouth daily. 30 tablet 3  . glucose blood (ONE TOUCH ULTRA TEST) test strip USE 1 STRIP TO CHECK GLUCOSE 4 TIMES DAILY; K86.89, E10.8 150 each 11  . Insulin Pen Needle (B-D UF III MINI PEN NEEDLES) 31G X 5 MM MISC use to inject insulin daily 100 each 0  . lisinopril (ZESTRIL) 10 MG tablet Take 1 tablet (10 mg total) by mouth daily. 90 tablet 1  . Multiple Vitamins-Minerals (CENTRUM SILVER ULTRA MENS PO) Take 1 tablet  by mouth daily.     . Pancrelipase, Lip-Prot-Amyl, (CREON) 24000-76000 units CPEP TAKE 2 CAPSULES WITH EACH MEAL AND 1 CAPSULE WITH SMALL SNACKS (10 CAPS PER DAY) (Patient not taking: Reported on 08/16/2019) 810 capsule 0  . pantoprazole (PROTONIX) 40 MG tablet Take 1 tablet (40 mg total) by mouth 2 (two) times daily before a meal. 180 tablet 0  . rosuvastatin (CRESTOR) 40 MG tablet TAKE 1 TABLET(40 MG) BY MOUTH AT BEDTIME (Patient taking differently: Take 40 mg by mouth at bedtime. ) 30 tablet 6   No current facility-administered medications on file prior to visit.     Allergies  Allergen Reactions  . Lipitor [Atorvastatin] Other (See Comments)    Muscle weakness  . Metformin And Related Diarrhea    Severe diarrhea    Family History  Problem Relation Age of Onset  . Diabetes Mother   . Diabetes Paternal Grandmother   . Diabetes Maternal Grandfather   . Colon cancer Neg Hx     BP 122/78 (BP Location: Left Arm, Patient Position: Sitting, Cuff Size: Normal)   Pulse 90   Temp 98.3 F (36.8 C)   Ht 6' (1.829 m)   Wt 193 lb (87.5 kg)   SpO2 96%   BMI 26.18 kg/m     Review of Systems He denies hypoglycemia.      Objective:   Physical Exam VITAL SIGNS:  See vs page GENERAL: no distress Pulses: dorsalis pedis absent bilat.   MSK: left transmetatarsal amputation.   CV:1+ bilat leg edema.   Skin:  no ulcer on the feet.  normal color and temp on the feet.   Neuro: sensation is intact to touch on the feet, but decreased from normal.   Ext: there is bilateral onychomycosis of the toenails.    Lab Results  Component Value Date   HGBA1C 8.1 (A) 08/27/2019       Assessment & Plan:  DM, due to panc insuff.  Lightheadedness, worse.  Despite cbg info, we should assume this is due to hypoglycemia.  Noncompliance with Novolog.  He needs to d/c this.    Patient Instructions  Please reduce the lantus to 66 units each morning, and:  Please stop taking the Novolog.  To prevent the blood sugar from going low in the morning, you should eat a snack at bedtime.   check your blood sugar twice a day.  vary the time of day when you check, between before the 3 meals, and at bedtime.  also check if you have symptoms of your blood sugar being too high or too low.  please keep a record of the readings and bring it to your next appointment here (or you can bring the meter itself).  You can write it on any piece of paper.  please call us sooner if your blood sugar goes below 70, or if you have a lot of readings over 200. Please come back for a follow-up appointment in 2 months.

## 2019-08-27 NOTE — Patient Instructions (Addendum)
Please reduce the lantus to 66 units each morning, and:  Please stop taking the Novolog.  To prevent the blood sugar from going low in the morning, you should eat a snack at bedtime.   check your blood sugar twice a day.  vary the time of day when you check, between before the 3 meals, and at bedtime.  also check if you have symptoms of your blood sugar being too high or too low.  please keep a record of the readings and bring it to your next appointment here (or you can bring the meter itself).  You can write it on any piece of paper.  please call us sooner if your blood sugar goes below 70, or if you have a lot of readings over 200. Please come back for a follow-up appointment in 2 months.

## 2019-08-30 ENCOUNTER — Telehealth: Payer: Self-pay | Admitting: Radiology

## 2019-08-30 NOTE — Telephone Encounter (Signed)
Enrolled patient for a 14 day [employer] event monitor to be mailed to patients home.  

## 2019-09-04 DIAGNOSIS — H4312 Vitreous hemorrhage, left eye: Secondary | ICD-10-CM | POA: Diagnosis not present

## 2019-09-07 DIAGNOSIS — H2513 Age-related nuclear cataract, bilateral: Secondary | ICD-10-CM | POA: Diagnosis not present

## 2019-09-07 DIAGNOSIS — E113593 Type 2 diabetes mellitus with proliferative diabetic retinopathy without macular edema, bilateral: Secondary | ICD-10-CM | POA: Diagnosis not present

## 2019-09-07 DIAGNOSIS — H43811 Vitreous degeneration, right eye: Secondary | ICD-10-CM | POA: Diagnosis not present

## 2019-09-07 DIAGNOSIS — H4312 Vitreous hemorrhage, left eye: Secondary | ICD-10-CM | POA: Diagnosis not present

## 2019-09-25 DIAGNOSIS — H18413 Arcus senilis, bilateral: Secondary | ICD-10-CM | POA: Diagnosis not present

## 2019-09-25 DIAGNOSIS — H2513 Age-related nuclear cataract, bilateral: Secondary | ICD-10-CM | POA: Diagnosis not present

## 2019-09-25 DIAGNOSIS — H25013 Cortical age-related cataract, bilateral: Secondary | ICD-10-CM | POA: Diagnosis not present

## 2019-09-25 DIAGNOSIS — H25043 Posterior subcapsular polar age-related cataract, bilateral: Secondary | ICD-10-CM | POA: Diagnosis not present

## 2019-10-08 DIAGNOSIS — H2512 Age-related nuclear cataract, left eye: Secondary | ICD-10-CM | POA: Diagnosis not present

## 2019-10-08 DIAGNOSIS — E113512 Type 2 diabetes mellitus with proliferative diabetic retinopathy with macular edema, left eye: Secondary | ICD-10-CM | POA: Diagnosis not present

## 2019-10-08 DIAGNOSIS — H4312 Vitreous hemorrhage, left eye: Secondary | ICD-10-CM | POA: Diagnosis not present

## 2019-10-09 DIAGNOSIS — E113592 Type 2 diabetes mellitus with proliferative diabetic retinopathy without macular edema, left eye: Secondary | ICD-10-CM | POA: Diagnosis not present

## 2019-10-09 DIAGNOSIS — H4312 Vitreous hemorrhage, left eye: Secondary | ICD-10-CM | POA: Diagnosis not present

## 2019-10-16 DIAGNOSIS — E113593 Type 2 diabetes mellitus with proliferative diabetic retinopathy without macular edema, bilateral: Secondary | ICD-10-CM | POA: Diagnosis not present

## 2019-10-17 ENCOUNTER — Other Ambulatory Visit: Payer: Self-pay | Admitting: Family Medicine

## 2019-10-17 ENCOUNTER — Ambulatory Visit: Payer: Medicare Other | Admitting: Endocrinology

## 2019-10-24 ENCOUNTER — Encounter: Payer: Self-pay | Admitting: Endocrinology

## 2019-10-24 ENCOUNTER — Ambulatory Visit (INDEPENDENT_AMBULATORY_CARE_PROVIDER_SITE_OTHER): Payer: Medicare Other | Admitting: Endocrinology

## 2019-10-24 ENCOUNTER — Other Ambulatory Visit: Payer: Self-pay

## 2019-10-24 DIAGNOSIS — K8689 Other specified diseases of pancreas: Secondary | ICD-10-CM

## 2019-10-24 DIAGNOSIS — E089 Diabetes mellitus due to underlying condition without complications: Secondary | ICD-10-CM | POA: Diagnosis not present

## 2019-10-24 NOTE — Progress Notes (Signed)
Subjective:    Patient ID: David Lane, male    DOB: [birth year], 74 y.o.   MRN: [id number]  HPI telehealth visit today via phone x 12 minutes Alternatives to telehealth are presented to this patient, and the patient agrees to the telehealth visit. Pt is advised of the cost of the visit, and agrees to this, also.   Patient is at home, and I am at the office.   Persons attending the telehealth visit: the patient and I Pt returns for f/u of diabetes mellitus: DM type: due to pancreatic insufficiency Dx'ed: 1988, during an episode of pancreatitis.   Complications: CAD, DR, PAD, left transmetatarsal amputation, and PN.  Therapy: insulin since 1990.   DKA: never.   Severe hypoglycemia: last episode was in 2014; he says this was probably due to taking more than the prescribed insulin dosage.  SDOH: in 2014, he was changed to qd lantus, for further simplification of his insulin schedule.  V-GO pump was considered, but pt twice did not keep appt for it; he is retired.   Interval history: no cbg record, but states cbg's vary from 60-300.  He has mild hypoglycemia approx twice per month.  He seldom takes novolog approx once per month   Past Medical History:  Diagnosis Date  . CAD (coronary artery disease)    last cath by [facility] DR.  Mihai Croitoru showing  some  disease involving LCX and small size of Diag   . CHF exacerbation, due to diastolic dysfunction [id number]  . Chronic systolic CHF (congestive heart failure), NYHA class 1 ([place]) 07/17/2012  . GERD (gastroesophageal reflux disease)   . Hepatic lesion 02/04/11  . Hyperlipemia   . Hypertension   . Ischemic cardiomyopathy    EF 35-40%  . Liver hemangioma   . NSTEMI (non-ST elevated myocardial infarction) ([place]) 11/21/2009  . Pancreatitis 2000's  . Shortness of breath   . Type II diabetes mellitus ([place])     Past Surgical History:  Procedure Laterality Date  . AMPUTATION Left 01/14/2018   Procedure: TRANSMETATARSAL AMPUTATION;   Surgeon: Newt Minion, MD;  Location: [facility];  Service: Orthopedics;  Laterality: Left;  . BIOPSY  04/26/2019   Procedure: BIOPSY;  Surgeon: Rush Landmark Telford Nab., MD;  Location: [facility];  Service: Gastroenterology;;  . CARDIAC CATHETERIZATION  2011   minimal disease, medical management  . COLONOSCOPY WITH PROPOFOL N/A 04/26/2019   Procedure: COLONOSCOPY WITH PROPOFOL;  Surgeon: Rush Landmark Telford Nab., MD;  [facility];  Service: Gastroenterology;  Laterality: N/A;  . CORONARY ANGIOPLASTY WITH STENT PLACEMENT  07/14/2012   successful PCI & stenting of mid LAD & PDA off the dominant CX  . CORONARY/GRAFT ACUTE MI REVASCULARIZATION N/A 12/20/2017   Procedure: Coronary/Graft Acute MI Revascularization;  Surgeon: Martinique, Peter M, MD;  Location: [place] [facility];  Service: Cardiovascular;  Laterality: N/A;  . ENTEROSCOPY N/A 04/25/2019   Procedure: ENTEROSCOPY;  Surgeon: Rush Landmark Telford Nab., MD;  [facility];  Service: Gastroenterology;  Laterality: N/A;  . ESOPHAGOGASTRODUODENOSCOPY Left 04/24/2019   Procedure: ESOPHAGOGASTRODUODENOSCOPY (EGD);  Surgeon: Lavena Bullion, DO;  [facility];  Service: Gastroenterology;  Laterality: Left;  . HEMOSTASIS CLIP PLACEMENT  04/26/2019   Procedure: HEMOSTASIS CLIP PLACEMENT;  Surgeon: Irving Copas., MD;  [facility];  Service: Gastroenterology;;  . HOT HEMOSTASIS N/A 04/26/2019   Procedure: HOT HEMOSTASIS (ARGON PLASMA COAGULATION/BICAP);  Surgeon: Irving Copas., MD;  Location: [facility];  Service: Gastroenterology;  Laterality: N/A;  .  INCISION AND DRAINAGE ABSCESS ANAL  1970's  . LEFT HEART CATH AND CORONARY ANGIOGRAPHY N/A 12/20/2017   Procedure: LEFT HEART CATH AND CORONARY ANGIOGRAPHY;  Surgeon: Martinique, Peter M, MD;  Location: [facility];  Service: Cardiovascular;  Laterality: N/A;  . LEFT HEART CATHETERIZATION WITH CORONARY ANGIOGRAM N/A 07/14/2012   Procedure: LEFT  HEART CATHETERIZATION WITH CORONARY ANGIOGRAM;  Surgeon: [name], MD;  Location: [facility];  Service: Cardiovascular;  Laterality: N/A;  . PERCUTANEOUS CORONARY STENT INTERVENTION (PCI-S) Right 07/14/2012   Procedure: PERCUTANEOUS CORONARY STENT INTERVENTION (PCI-S);  Surgeon: [name], MD;  [facility];  Service: Cardiovascular;  Laterality: Right;  . POLYPECTOMY  04/26/2019   Procedure: POLYPECTOMY;  Surgeon: Mansouraty, Telford Nab., MD;  Location: [facility];  Service: Gastroenterology;;  . Lia Foyer TATTOO INJECTION  04/25/2019   Procedure: SUBMUCOSAL TATTOO INJECTION;  Surgeon: Irving Copas., MD;  Location: [facility];  Service: Gastroenterology;;    Social History   Socioeconomic History  . Marital status: Single    Spouse name: Not on file  . Number of children: 0  . Years of education: Not on file  . Highest education level: Not on file  Occupational History  . Occupation: retired    Fish farm manager: [employer]  Tobacco Use  . Smoking status: Never Smoker  . Smokeless tobacco: Never Used  Substance and Sexual Activity  . Alcohol use: No  . Drug use: No  . Sexual activity: Never  Other Topics Concern  . Not on file  Social History Narrative  . Not on file   Social Determinants of Health   Financial Resource Strain:   . Difficulty of Paying Living Expenses: Not on file  Food Insecurity:   . Worried About Charity fundraiser in the Last Year: Not on file  . Ran Out of Food in the Last Year: Not on file  Transportation Needs:   . Lack of Transportation (Medical): Not on file  . Lack of Transportation (Non-Medical): Not on file  Physical Activity:   . Days of Exercise per Week: Not on file  . Minutes of Exercise per Session: Not on file  Stress:   . Feeling of Stress : Not on file  Social Connections:   . Frequency of Communication with Friends and Family: Not on file  . Frequency of Social Gatherings with Friends and Family: Not on  file  . Attends Religious Services: Not on file  . Active Member of Clubs or Organizations: Not on file  . Attends Archivist Meetings: Not on file  . Marital Status: Not on file  Intimate Partner Violence:   . Fear of Current or Ex-Partner: Not on file  . Emotionally Abused: Not on file  . Physically Abused: Not on file  . Sexually Abused: Not on file    Current Outpatient Medications on File Prior to Visit  Medication Sig Dispense Refill  . aspirin 81 MG tablet Take 1 tablet (81 mg total) by mouth daily. 30 tablet   . carvedilol (COREG) 6.25 MG tablet TAKE 1 TABLET(6.25 MG) BY MOUTH TWICE DAILY (Patient taking differently: Take 6.25 mg by mouth 2 (two) times daily with a meal. ) 180 tablet 2  . Cholecalciferol (VITAMIN D3) 50 MCG (2000 UT) TABS Take 1 tablet by mouth daily.    Marland Kitchen escitalopram (LEXAPRO) 5 MG tablet TAKE 1 TABLET(5 MG) BY MOUTH DAILY 30 tablet 3  . glucose blood (ONE TOUCH ULTRA TEST) test strip USE 1  STRIP TO CHECK GLUCOSE 4 TIMES DAILY; K86.89, E10.8 150 each 11  . Insulin Glargine (LANTUS SOLOSTAR) 100 UNIT/ML Solostar Pen Inject 66 Units into the skin every morning. And pen needles 1/day 10 pen 11  . Insulin Pen Needle (B-D UF III MINI PEN NEEDLES) 31G X 5 MM MISC use to inject insulin daily 100 each 0  . lisinopril (ZESTRIL) 10 MG tablet Take 1 tablet (10 mg total) by mouth daily. 90 tablet 1  . Multiple Vitamins-Minerals (CENTRUM SILVER ULTRA MENS PO) Take 1 tablet by mouth daily.     . Pancrelipase, Lip-Prot-Amyl, (CREON) 24000-76000 units CPEP TAKE 2 CAPSULES WITH EACH MEAL AND 1 CAPSULE WITH SMALL SNACKS (10 CAPS PER DAY) 810 capsule 0  . pantoprazole (PROTONIX) 40 MG tablet TAKE 1 TABLET(40 MG) BY MOUTH TWICE DAILY BEFORE A MEAL 180 tablet 0  . rosuvastatin (CRESTOR) 40 MG tablet TAKE 1 TABLET(40 MG) BY MOUTH AT BEDTIME (Patient taking differently: Take 40 mg by mouth at bedtime. ) 30 tablet 6   No current facility-administered medications on file prior  to visit.    Allergies  Allergen Reactions  . Lipitor [Atorvastatin] Other (See Comments)    Muscle weakness  . Metformin And Related Diarrhea    Severe diarrhea    Family History  Problem Relation Age of Onset  . Diabetes Mother   . Diabetes Paternal Grandmother   . Diabetes Maternal Grandfather   . Colon cancer Neg Hx     There were no vitals taken for this visit.  Review of Systems He has lost a few lbs, due to his efforts.      Objective:   Physical Exam      Assessment & Plan:  Insulin-requiring type 2 DM: glycemic control is improved.  Hypoglycemia: this limits aggressiveness of glycemic control.   Patient Instructions  Please continue the same insulin  To prevent the blood sugar from going low in the morning, you should eat a snack at bedtime.   check your blood sugar twice a day.  vary the time of day when you check, between before the 3 meals, and at bedtime.  also check if you have symptoms of your blood sugar being too high or too low.  please keep a record of the readings and bring it to your next appointment here (or you can bring the meter itself).  You can write it on any piece of paper.  please call us sooner if your blood sugar goes below 70, or if you have a lot of readings over 200. Please come back for a follow-up appointment in 1 month.

## 2019-10-24 NOTE — Patient Instructions (Addendum)
Please continue the same insulin  To prevent the blood sugar from going low in the morning, you should eat a snack at bedtime.   check your blood sugar twice a day.  vary the time of day when you check, between before the 3 meals, and at bedtime.  also check if you have symptoms of your blood sugar being too high or too low.  please keep a record of the readings and bring it to your next appointment here (or you can bring the meter itself).  You can write it on any piece of paper.  please call us sooner if your blood sugar goes below 70, or if you have a lot of readings over 200. Please come back for a follow-up appointment in 1 month.

## 2019-11-01 ENCOUNTER — Encounter: Payer: Self-pay | Admitting: *Deleted

## 2019-11-01 NOTE — Progress Notes (Signed)
Patient ID: David Lane, male   DOB: 12-22-1945, 74 y.o.   MRN: [id number] 09/27/2019 Received cancellation notification from [employer].  Patient requested cancellation of monitor service.

## 2019-11-22 ENCOUNTER — Other Ambulatory Visit: Payer: Self-pay | Admitting: Endocrinology

## 2019-11-22 DIAGNOSIS — E089 Diabetes mellitus due to underlying condition without complications: Secondary | ICD-10-CM

## 2019-11-27 ENCOUNTER — Ambulatory Visit (INDEPENDENT_AMBULATORY_CARE_PROVIDER_SITE_OTHER): Payer: Medicare Other | Admitting: Endocrinology

## 2019-11-27 ENCOUNTER — Other Ambulatory Visit: Payer: Self-pay

## 2019-11-27 DIAGNOSIS — E089 Diabetes mellitus due to underlying condition without complications: Secondary | ICD-10-CM

## 2019-11-27 DIAGNOSIS — K8689 Other specified diseases of pancreas: Secondary | ICD-10-CM | POA: Diagnosis not present

## 2019-11-27 MED ORDER — LANTUS SOLOSTAR 100 UNIT/ML ~~LOC~~ SOPN: 65.0000 [IU] | PEN_INJECTOR | SUBCUTANEOUS | 11 refills | Status: DC

## 2019-11-27 NOTE — Patient Instructions (Addendum)
Please continue the same insulin.  To prevent the blood sugar from going low in the morning, you should eat a snack at bedtime.   Please call ahead, to have the A1c checked.  check your blood sugar twice a day.  vary the time of day when you check, between before the 3 meals, and at bedtime.  also check if you have symptoms of your blood sugar being too high or too low.  please keep a record of the readings and bring it to your next appointment here (or you can bring the meter itself).  You can write it on any piece of paper.  please call us sooner if your blood sugar goes below 70, or if you have a lot of readings over 200. Please come back for a follow-up appointment in 2-3 months.

## 2019-11-27 NOTE — Progress Notes (Signed)
Subjective:    Patient ID: David Lane, male    DOB: May 08, 1946, 74 y.o.   MRN: [id number]  HPI telehealth visit today via phone x 14 minutes Alternatives to telehealth are presented to this patient, and the patient agrees to the telehealth visit. Pt is advised of the cost of the visit, and agrees to this, also.   Patient is at home, and I am at the office.   Persons attending the telehealth visit: the patient and I Pt returns for f/u of diabetes mellitus: DM type: due to pancreatic insufficiency Dx'ed: 1988, during an episode of pancreatitis.   Complications: CAD, DR, PAD, left transmetatarsal amputation, and PN.  Therapy: insulin since 1990.   DKA: never.   Severe hypoglycemia: last episode was in 2014; he says this was probably due to taking more than the prescribed insulin dosage.  SDOH: in 2014, he was changed to qd lantus, for further simplification of his insulin schedule.  V-GO pump was considered, but pt twice did not keep appt for it; he is retired.   Interval history: no cbg record, but states cbg's vary from 90-229.  He takes 65 units qam.  Pt says he never misses the insulin.  pt states he feels well in general.   Past Medical History:  Diagnosis Date  . CAD (coronary artery disease)    last cath by [facility] DR.  Mihai Croitoru showing  some  disease involving LCX and small size of Diag   . CHF exacerbation, due to diastolic dysfunction [id number]  . Chronic systolic CHF (congestive heart failure), NYHA class 1 ([place]) 07/17/2012  . GERD (gastroesophageal reflux disease)   . Hepatic lesion 02/04/11  . Hyperlipemia   . Hypertension   . Ischemic cardiomyopathy    EF 35-40%  . Liver hemangioma   . NSTEMI (non-ST elevated myocardial infarction) ([place]) 11/21/2009  . Pancreatitis 2000's  . Shortness of breath   . Type II diabetes mellitus ([place])     Past Surgical History:  Procedure Laterality Date  . AMPUTATION Left 01/14/2018   Procedure: TRANSMETATARSAL AMPUTATION;   Surgeon: Newt Minion, MD;  Location: [place];  Service: Orthopedics;  Laterality: Left;  . BIOPSY  04/26/2019   Procedure: BIOPSY;  Surgeon: Rush Landmark Telford Nab., MD;  Location: [facility];  Service: Gastroenterology;;  . CARDIAC CATHETERIZATION  2011   minimal disease, medical management  . COLONOSCOPY WITH PROPOFOL N/A 04/26/2019   Procedure: COLONOSCOPY WITH PROPOFOL;  Surgeon: Rush Landmark Telford Nab., MD;  Location: [facility];  Service: Gastroenterology;  Laterality: N/A;  . CORONARY ANGIOPLASTY WITH STENT PLACEMENT  07/14/2012   successful PCI & stenting of mid LAD & PDA off the dominant CX  . CORONARY/GRAFT ACUTE MI REVASCULARIZATION N/A 12/20/2017   Procedure: Coronary/Graft Acute MI Revascularization;  Surgeon: Martinique, Peter M, MD;  Location: [facility];  Service: Cardiovascular;  Laterality: N/A;  . ENTEROSCOPY N/A 04/25/2019   Procedure: ENTEROSCOPY;  Surgeon: Rush Landmark Telford Nab., MD;  Location: [facility];  Service: Gastroenterology;  Laterality: N/A;  . ESOPHAGOGASTRODUODENOSCOPY Left 04/24/2019   Procedure: ESOPHAGOGASTRODUODENOSCOPY (EGD);  Surgeon: Lavena Bullion, DO;  Location: [facility];  Service: Gastroenterology;  Laterality: Left;  . HEMOSTASIS CLIP PLACEMENT  04/26/2019   Procedure: HEMOSTASIS CLIP PLACEMENT;  Surgeon: Irving Copas., MD;  Location: [facility];  Service: Gastroenterology;;  . HOT HEMOSTASIS N/A 04/26/2019   Procedure: HOT HEMOSTASIS (ARGON PLASMA COAGULATION/BICAP);  Surgeon: Irving Copas., MD;  Location: [facility];  Service: Gastroenterology;  Laterality: N/A;  . INCISION AND DRAINAGE ABSCESS ANAL  1970's  . LEFT HEART CATH AND CORONARY ANGIOGRAPHY N/A 12/20/2017   Procedure: LEFT HEART CATH AND CORONARY ANGIOGRAPHY;  Surgeon: Martinique, Peter M, MD;  Location: [facility];  Service: Cardiovascular;  Laterality: N/A;  . LEFT HEART CATHETERIZATION WITH CORONARY ANGIOGRAM N/A 07/14/2012   Procedure: LEFT  HEART CATHETERIZATION WITH CORONARY ANGIOGRAM;  Surgeon: [name], MD;  Location: [facility];  Service: Cardiovascular;  Laterality: N/A;  . PERCUTANEOUS CORONARY STENT INTERVENTION (PCI-S) Right 07/14/2012   Procedure: PERCUTANEOUS CORONARY STENT INTERVENTION (PCI-S);  Surgeon: [name], MD;  Location: [facility];  Service: Cardiovascular;  Laterality: Right;  . POLYPECTOMY  04/26/2019   Procedure: POLYPECTOMY;  Surgeon: Mansouraty, Telford Nab., MD;  Location: [facility];  Service: Gastroenterology;;  . Lia Foyer TATTOO INJECTION  04/25/2019   Procedure: SUBMUCOSAL TATTOO INJECTION;  Surgeon: Irving Copas., MD;  Location: [facility];  Service: Gastroenterology;;    Social History   Socioeconomic History  . Marital status: Single    Spouse name: Not on file  . Number of children: 0  . Years of education: Not on file  . Highest education level: Not on file  Occupational History  . Occupation: retired    Fish farm manager: [employer]  Tobacco Use  . Smoking status: Never Smoker  . Smokeless tobacco: Never Used  Substance and Sexual Activity  . Alcohol use: No  . Drug use: No  . Sexual activity: Never  Other Topics Concern  . Not on file  Social History Narrative  . Not on file   Social Determinants of Health   Financial Resource Strain:   . Difficulty of Paying Living Expenses: Not on file  Food Insecurity:   . Worried About Charity fundraiser in the Last Year: Not on file  . Ran Out of Food in the Last Year: Not on file  Transportation Needs:   . Lack of Transportation (Medical): Not on file  . Lack of Transportation (Non-Medical): Not on file  Physical Activity:   . Days of Exercise per Week: Not on file  . Minutes of Exercise per Session: Not on file  Stress:   . Feeling of Stress : Not on file  Social Connections:   . Frequency of Communication with Friends and Family: Not on file  . Frequency of Social Gatherings with Friends and Family: Not on  file  . Attends Religious Services: Not on file  . Active Member of Clubs or Organizations: Not on file  . Attends Archivist Meetings: Not on file  . Marital Status: Not on file  Intimate Partner Violence:   . Fear of Current or Ex-Partner: Not on file  . Emotionally Abused: Not on file  . Physically Abused: Not on file  . Sexually Abused: Not on file    Current Outpatient Medications on File Prior to Visit  Medication Sig Dispense Refill  . aspirin 81 MG tablet Take 1 tablet (81 mg total) by mouth daily. 30 tablet   . B-D UF III MINI PEN NEEDLES 31G X 5 MM MISC USE TO INJECT INSULIN DAILY 100 each 0  . carvedilol (COREG) 6.25 MG tablet TAKE 1 TABLET(6.25 MG) BY MOUTH TWICE DAILY (Patient taking differently: Take 6.25 mg by mouth 2 (two) times daily with a meal. ) 180 tablet 2  . Cholecalciferol (VITAMIN D3) 50 MCG (2000 UT) TABS Take 1 tablet by mouth daily.    Marland Kitchen escitalopram (LEXAPRO)  5 MG tablet TAKE 1 TABLET(5 MG) BY MOUTH DAILY 30 tablet 3  . glucose blood (ONE TOUCH ULTRA TEST) test strip USE 1 STRIP TO CHECK GLUCOSE 4 TIMES DAILY; K86.89, E10.8 150 each 11  . lisinopril (ZESTRIL) 10 MG tablet Take 1 tablet (10 mg total) by mouth daily. 90 tablet 1  . Multiple Vitamins-Minerals (CENTRUM SILVER ULTRA MENS PO) Take 1 tablet by mouth daily.     . Pancrelipase, Lip-Prot-Amyl, (CREON) 24000-76000 units CPEP TAKE 2 CAPSULES WITH EACH MEAL AND 1 CAPSULE WITH SMALL SNACKS (10 CAPS PER DAY) 810 capsule 0  . pantoprazole (PROTONIX) 40 MG tablet TAKE 1 TABLET(40 MG) BY MOUTH TWICE DAILY BEFORE A MEAL 180 tablet 0  . rosuvastatin (CRESTOR) 40 MG tablet TAKE 1 TABLET(40 MG) BY MOUTH AT BEDTIME (Patient taking differently: Take 40 mg by mouth at bedtime. ) 30 tablet 6   No current facility-administered medications on file prior to visit.    Allergies  Allergen Reactions  . Lipitor [Atorvastatin] Other (See Comments)    Muscle weakness  . Metformin And Related Diarrhea    Severe  diarrhea    Family History  Problem Relation Age of Onset  . Diabetes Mother   . Diabetes Paternal Grandmother   . Diabetes Maternal Grandfather   . Colon cancer Neg Hx     There were no vitals taken for this visit.   Review of Systems He denies hypoglycemia.     Objective:   Physical Exam      Assessment & Plan:  Insulin-requiring type 2 DM: apparently well-controlled.   Patient Instructions  Please continue the same insulin.  To prevent the blood sugar from going low in the morning, you should eat a snack at bedtime.   Please call ahead, to have the A1c checked.  check your blood sugar twice a day.  vary the time of day when you check, between before the 3 meals, and at bedtime.  also check if you have symptoms of your blood sugar being too high or too low.  please keep a record of the readings and bring it to your next appointment here (or you can bring the meter itself).  You can write it on any piece of paper.  please call us sooner if your blood sugar goes below 70, or if you have a lot of readings over 200. Please come back for a follow-up appointment in 2-3 months.

## 2020-01-11 ENCOUNTER — Other Ambulatory Visit: Payer: Self-pay

## 2020-01-11 MED ORDER — LISINOPRIL 10 MG PO TABS: 10.0000 mg | ORAL_TABLET | Freq: Every day | ORAL | 1 refills | Status: DC

## 2020-01-14 ENCOUNTER — Other Ambulatory Visit: Payer: Self-pay | Admitting: Family Medicine

## 2020-01-15 NOTE — Telephone Encounter (Signed)
Last Ov08/11/20 Last fill 10/17/19  #180/0 Pt need to follow up with new PCP

## 2020-01-16 NOTE — Telephone Encounter (Signed)
He should schedule with pcp for additional refills

## 2020-01-28 ENCOUNTER — Telehealth: Payer: Self-pay

## 2020-01-28 ENCOUNTER — Telehealth (INDEPENDENT_AMBULATORY_CARE_PROVIDER_SITE_OTHER): Payer: Medicare Other | Admitting: Physician Assistant

## 2020-01-28 ENCOUNTER — Encounter: Payer: Self-pay | Admitting: Physician Assistant

## 2020-01-28 VITALS — BP 85/62 | HR 99 | Ht 72.0 in | Wt 195.0 lb

## 2020-01-28 DIAGNOSIS — Z8719 Personal history of other diseases of the digestive system: Secondary | ICD-10-CM

## 2020-01-28 DIAGNOSIS — I255 Ischemic cardiomyopathy: Secondary | ICD-10-CM

## 2020-01-28 DIAGNOSIS — I5042 Chronic combined systolic (congestive) and diastolic (congestive) heart failure: Secondary | ICD-10-CM | POA: Diagnosis not present

## 2020-01-28 DIAGNOSIS — I2510 Atherosclerotic heart disease of native coronary artery without angina pectoris: Secondary | ICD-10-CM | POA: Diagnosis not present

## 2020-01-28 DIAGNOSIS — I519 Heart disease, unspecified: Secondary | ICD-10-CM

## 2020-01-28 DIAGNOSIS — R0989 Other specified symptoms and signs involving the circulatory and respiratory systems: Secondary | ICD-10-CM | POA: Diagnosis not present

## 2020-01-28 DIAGNOSIS — E785 Hyperlipidemia, unspecified: Secondary | ICD-10-CM

## 2020-01-28 DIAGNOSIS — I739 Peripheral vascular disease, unspecified: Secondary | ICD-10-CM

## 2020-01-28 DIAGNOSIS — E108 Type 1 diabetes mellitus with unspecified complications: Secondary | ICD-10-CM

## 2020-01-28 DIAGNOSIS — K8689 Other specified diseases of pancreas: Secondary | ICD-10-CM

## 2020-01-28 NOTE — Telephone Encounter (Signed)
Called patient to discuss AVS instructions gave Hao Meng's recommendations and patient voiced understanding. AVS summary mailed to patient.    

## 2020-01-28 NOTE — Progress Notes (Signed)
Virtual Visit via Telephone Note   This visit type was conducted due to national recommendations for restrictions regarding the COVID-19 Pandemic (e.g. social distancing) in an effort to limit this patient's exposure and mitigate transmission in our community.  Due to his co-morbid illnesses, this patient is at least at moderate risk for complications without adequate follow up.  This format is felt to be most appropriate for this patient at this time.  The patient did not have access to video technology/had technical difficulties with video requiring transitioning to audio format only (telephone).  All issues noted in this document were discussed and addressed.  No physical exam could be performed with this format.  Please refer to the patient's chart for his  consent to telehealth for [facility].   The patient was identified using 2 identifiers.  Date:  01/28/2020   ID:  David Lane, David Lane 1945-12-01, MRN David Lane:[id number]  Patient Location: Home Provider Location: Home  PCP:  Luetta Nutting, DO  Cardiologist:  [name], MD  Electrophysiologist:  None   Evaluation Performed:  Follow-Up Visit  Chief Complaint:  followup  History of Present Illness:    David Lane is a 74 y.o. male with past medical history of chronic combined systolic and diastolic heart failure, ischemic cardiomyopathy, CAD, type I DM with peripheral neuropathy, hypertension, pancreatic insufficiency following severe gallstone pancreatitis, PAD s/p transmetatarsal amputation of the left foot, and history of upper GI bleeding requiring blood transfusion in July 2020.  He had anterior STEMI in March 2019 with placement of DES to mid LAD, he also had 80% restenosis in the previous left PDA stent and 75% stenosis distal to RCA stent.  Last echocardiogram obtained on 12/21/2017 showed EF 35 to 40%, grade 1 DD, mildly dilated ascending aorta measuring at 4 cm.  Despite LV dysfunction, he has not had any heart failure  symptoms nor does he require any loop with diuretic.  Based on the previous note, patient has significant anxiety issue and often checks his blood pressure multiple times throughout the day.  Due to variability of his blood pressure, heart monitor was ordered.  Since the last visit in November, he went to the ED on 08/21/2019 with diaphoresis.  His blood sugar was normal.  Labs and serial troponin were reassuring.  It was suspected his home CBG management may be contributing to the diaphoresis and recommended for him to see his PCP.  Patient presents today for virtual visit.  He was feeling poorly last night when his systolic blood pressure dropped down to the 70s, this morning, his systolic blood pressure came up to the 80s and at last check showed systolic blood pressure has normalized at 107.  He is on 6.25 mg twice daily of carvedilol and low-dose lisinopril 10 mg every morning.  He denies any recent chest pain, lower extremity edema, orthopnea or PND.  He has been having significant blood pressure variability and this is not the first time his blood pressure has dropped like this.  Yesterday, he woke up with normal blood pressure at 119/73, this eventually went up to 174/106 by 12 PM.  Then later dropped down to 73/51 by 6:30 PM.  I wonder if he is overmedicated on the blood pressure medication.  Blood pressure elevation that was seen is likely related to anxiety as he checked his blood pressure multiple times throughout the day.  However anxiety cannot cause the low blood pressure which is likely what his blood pressure looks like  after the blood pressure medication.  He says in the past several weeks, he has had at least 7 or 8 times where his systolic blood pressure dropped down below 100 mmHg.  I instructed the patient to hold blood pressure medication if systolic blood pressure is less than 100 mmHg.  Unfortunately he never had heart monitor last year.  He says he received a heart monitor and for some  reason he is sent to monitor back without wearing it.  He does not remember why he is in the monitor back and know what was the reason he could not complete the heart monitor.  However he says he does not have any palpitation at this time.  I recommended a repeat echocardiogram.  At some point, it may be beneficial for him to have a 24-hour ambulatory BP monitor to see blood pressure variability throughout the day instead of relying on the patient to obtain blood pressure multiple times throughout the day.  I suspect the blood pressure variability is due to the fact that his blood pressure is elevated when he is anxious however low when he is overmedicated on the blood pressure medication.  I plan to bring the patient back in 2 to 3 weeks in the clinic for reassessment.  The patient does not have symptoms concerning for COVID-19 infection (fever, chills, cough, or new shortness of breath).    Past Medical History:  Diagnosis Date  . CAD (coronary artery disease)    last cath by [facility] DR.  Mihai Croitoru showing  some  disease involving LCX and small size of Diag   . CHF exacerbation, due to diastolic dysfunction [id number]  . Chronic systolic CHF (congestive heart failure), NYHA class 1 ([place]) 07/17/2012  . GERD (gastroesophageal reflux disease)   . Hepatic lesion 02/04/11  . Hyperlipemia   . Hypertension   . Ischemic cardiomyopathy    EF 35-40%  . Liver hemangioma   . NSTEMI (non-ST elevated myocardial infarction) ([place]) 11/21/2009  . Pancreatitis 2000's  . Shortness of breath   . Type II diabetes mellitus ([place])    Past Surgical History:  Procedure Laterality Date  . AMPUTATION Left 01/14/2018   Procedure: TRANSMETATARSAL AMPUTATION;  Surgeon: Newt Minion, MD;  Location: [facility];  Service: Orthopedics;  Laterality: Left;  . BIOPSY  04/26/2019   Procedure: BIOPSY;  Surgeon: Rush Landmark Telford Nab., MD;  Location: [facility];  Service: Gastroenterology;;  . CARDIAC CATHETERIZATION  2011    minimal disease, medical management  . COLONOSCOPY WITH PROPOFOL N/A 04/26/2019   Procedure: COLONOSCOPY WITH PROPOFOL;  Surgeon: Rush Landmark Telford Nab., MD;  Location: [facility];  Service: Gastroenterology;  Laterality: N/A;  . CORONARY ANGIOPLASTY WITH STENT PLACEMENT  07/14/2012   successful PCI & stenting of mid LAD & PDA off the dominant CX  . CORONARY/GRAFT ACUTE MI REVASCULARIZATION N/A 12/20/2017   Procedure: Coronary/Graft Acute MI Revascularization;  Surgeon: Martinique, Peter M, MD;  Location: [facility];  Service: Cardiovascular;  Laterality: N/A;  . ENTEROSCOPY N/A 04/25/2019   Procedure: ENTEROSCOPY;  Surgeon: Rush Landmark Telford Nab., MD;  Location: [facility];  Service: Gastroenterology;  Laterality: N/A;  . ESOPHAGOGASTRODUODENOSCOPY Left 04/24/2019   Procedure: ESOPHAGOGASTRODUODENOSCOPY (EGD);  Surgeon: Lavena Bullion, DO;  Location: [facility];  Service: Gastroenterology;  Laterality: Left;  . HEMOSTASIS CLIP PLACEMENT  04/26/2019   Procedure: HEMOSTASIS CLIP PLACEMENT;  Surgeon: Irving Copas., MD;  Location: [facility];  Service: Gastroenterology;;  . HOT HEMOSTASIS N/A 04/26/2019   Procedure:  HOT HEMOSTASIS (ARGON PLASMA COAGULATION/BICAP);  Surgeon: Irving Copas., MD;  Location: [facility];  Service: Gastroenterology;  Laterality: N/A;  . INCISION AND DRAINAGE ABSCESS ANAL  1970's  . LEFT HEART CATH AND CORONARY ANGIOGRAPHY N/A 12/20/2017   Procedure: LEFT HEART CATH AND CORONARY ANGIOGRAPHY;  Surgeon: Martinique, Peter M, MD;  Location: [facility];  Service: Cardiovascular;  Laterality: N/A;  . LEFT HEART CATHETERIZATION WITH CORONARY ANGIOGRAM N/A 07/14/2012   Procedure: LEFT HEART CATHETERIZATION WITH CORONARY ANGIOGRAM;  Surgeon: [name], MD;  Location: [facility];  Service: Cardiovascular;  Laterality: N/A;  . PERCUTANEOUS CORONARY STENT INTERVENTION (PCI-S) Right 07/14/2012   Procedure: PERCUTANEOUS CORONARY STENT  INTERVENTION (PCI-S);  Surgeon: [name], MD;  Location: [facility];  Service: Cardiovascular;  Laterality: Right;  . POLYPECTOMY  04/26/2019   Procedure: POLYPECTOMY;  Surgeon: Mansouraty, Telford Nab., MD;  Location: [facility];  Service: Gastroenterology;;  . Lia Foyer TATTOO INJECTION  04/25/2019   Procedure: SUBMUCOSAL TATTOO INJECTION;  Surgeon: Irving Copas., MD;  Location: [facility];  Service: Gastroenterology;;     Current Meds  Medication Sig  . aspirin 81 MG tablet Take 1 tablet (81 mg total) by mouth daily.  . B-D UF III MINI PEN NEEDLES 31G X 5 MM MISC USE TO INJECT INSULIN DAILY  . carvedilol (COREG) 6.25 MG tablet TAKE 1 TABLET(6.25 MG) BY MOUTH TWICE DAILY (Patient taking differently: Take 6.25 mg by mouth 2 (two) times daily with a meal. )  . Cholecalciferol (VITAMIN D3) 50 MCG (2000 UT) TABS Take 1 tablet by mouth daily.  Marland Kitchen escitalopram (LEXAPRO) 5 MG tablet TAKE 1 TABLET(5 MG) BY MOUTH DAILY  . glucose blood (ONE TOUCH ULTRA TEST) test strip USE 1 STRIP TO CHECK GLUCOSE 4 TIMES DAILY; K86.89, E10.8  . Insulin Glargine (LANTUS SOLOSTAR) 100 UNIT/ML Solostar Pen Inject 65 Units into the skin every morning. And pen needles 1/day (Patient taking differently: Inject 68 Units into the skin every morning. And pen needles 1/day)  . lisinopril (ZESTRIL) 10 MG tablet Take 1 tablet (10 mg total) by mouth daily.  . Multiple Vitamins-Minerals (CENTRUM SILVER ULTRA MENS PO) Take 1 tablet by mouth daily.   . pantoprazole (PROTONIX) 40 MG tablet TAKE 1 TABLET(40 MG) BY MOUTH TWICE DAILY BEFORE A MEAL  . rosuvastatin (CRESTOR) 40 MG tablet TAKE 1 TABLET(40 MG) BY MOUTH AT BEDTIME (Patient taking differently: Take 40 mg by mouth at bedtime. )     Allergies:   Lipitor [atorvastatin] and Metformin and related   Social History   Tobacco Use  . Smoking status: Never Smoker  . Smokeless tobacco: Never Used  Substance Use Topics  . Alcohol use: No  . Drug use: No      Family Hx: The patient's family history includes Diabetes in his maternal grandfather, mother, and paternal grandmother. There is no history of Colon cancer.  ROS:   Please see the history of present illness.     All other systems reviewed and are negative.   Prior CV studies:   The following studies were reviewed today:  Echo 12/21/2017 LV EF: 35% -  40%   Study Conclusions   - Left ventricle: The cavity size was normal. Wall thickness was  increased in a pattern of mild LVH. Apical anterior and apical  inferior akinesis. Mid to apical anteroseptal akinesis. Akinesis  of the apex. Systolic function was moderately reduced. The  estimated ejection fraction was in the range of 35% to 40%.  Doppler parameters are consistent with abnormal left ventricular  relaxation (grade 1 diastolic dysfunction).  - Aortic valve: There was trivial regurgitation.  - Aorta: Mildly dilated ascending aorta to 4.0 cm.  - Mitral valve: There was no significant regurgitation.  - Right ventricle: The cavity size was normal. Systolic function  was normal.  - Tricuspid valve: Peak RV-RA gradient (S): 17 mm Hg.  - Pulmonary arteries: PA peak pressure: 20 mm Hg (S).  - Inferior vena cava: The vessel was normal in size. The  respirophasic diameter changes were in the normal range (>= 50%), consistent with normal central venous pressure.   Impressions:   - Normal LV size with mild LV hypertrophy. Wall motion  abnormalities as noted above. EF 35-40%. Normal RV size and  systolic function. No significant valvular abnormalities.     Cath 12/20/2017  Mid LAD lesion is 100% stenosed.  A drug-eluting stent was successfully placed using a STENT SYNERGY DES 2.5X32.  Post intervention, there is a 0% residual stenosis.  Ost LAD to Prox LAD lesion is 40% stenosed.  Mid LAD to Dist LAD lesion is 50% stenosed.  Ost 1st Mrg lesion is 50% stenosed.  LPDA lesion is 75%  stenosed.  Previously placed Dist Cx stent (unknown type) is widely patent.  Ost LPDA to LPDA lesion is 80% stenosed.  Prox RCA to Mid RCA lesion is 95% stenosed.  There is mild left ventricular systolic dysfunction.  LV end diastolic pressure is normal.  The left ventricular ejection fraction is 45-50% by visual estimate.   1. 3 vessel obstructive CAD.     - 100% mid LAD in stent thrombosis. This is the culprit lesion    - 80% focal stenosis in stent in the PDA with 75% focal stenosis distal to the stent.    - 95% small nondominant RCA 2. Mild LV dysfunction with apical wall motion abnormality. Overall EF 45-50% 3. Normal LVEDP 4. Successful PCI of the mid LAD with DES.   Plan: would recommend long term DAPT. Admit to ICU for post PCI care. Will discuss with interventional team concerning disease in the PDA and whether we should treat this medically or with PCI.   Labs/Other Tests and Data Reviewed:    EKG:  An ECG dated 08/21/2019 was personally reviewed today and demonstrated:  Normal sinus rhythm with poor R wave progression in the anterior leads.  Recent Labs: 08/21/2019: ALT 47; BUN 24; Creatinine, Ser 1.25; Hemoglobin 13.9; Platelets 236; Potassium 4.2; Sodium 137; TSH 2.426   Recent Lipid Panel Lab Results  Component Value Date/Time   CHOL 102 12/21/2017 03:45 AM   TRIG 70 12/21/2017 03:45 AM   HDL 24 (L) 12/21/2017 03:45 AM   CHOLHDL 4.3 12/21/2017 03:45 AM   LDLCALC 64 12/21/2017 03:45 AM    Wt Readings from Last 3 Encounters:  01/28/20 195 lb (88.5 kg)  08/27/19 193 lb (87.5 kg)  08/16/19 190 lb (86.2 kg)     Objective:    Vital Signs:  BP (!) 85/62   Pulse 99   Ht 6' (1.829 m)   Wt 195 lb (88.5 kg)   BMI 26.45 kg/m    5/2     9AM   119/73    11:30AM   166/100     12PM   174/106    P 109     1:40PM    128/85     6:30PM  73/51  5/3    85/62    107/70  VITAL SIGNS:  reviewed  ASSESSMENT & PLAN:    1. Labile hypertension  -As mentioned  above, I suspect his blood pressure is elevated when he is anxious and sometimes his blood pressure become very low when he is overmedicated.  I asked the patient to proceed with repeat echocardiogram and follow-up with me in 2 to 3 weeks.  He will need to bring with him his blood pressure diary, his blood pressure cuff and also all of his medication bottles  2. Chronic combined systolic and diastolic heart failure  -Euvolemic based on his symptom, no lower extremity edema, orthopnea or PND  3. Ischemic cardiomyopathy: EF was 35 to 40% based on previous echocardiogram in March 2019, recommend repeat echocardiogram  4. CAD: Denies any recent chest pain.  Last PCI was in March 2019.  On aspirin.  5. Hyperlipidemia: Continue statin therapy  6. Type I DM with peripheral neuropathy: Most recent hemoglobin A1c obtained on 08/27/2019 was 8.1.  Need better control  7. Pancreatic insufficiency: On Creon.  Has frequent diarrhea with fatty stools.  8. PAD: Denies significant claudication symptom at this time.  9. History of upper GI bleed: Followed by GI, no recurrence   COVID-19 Education: The signs and symptoms of COVID-19 were discussed with the patient and how to seek care for testing (follow up with PCP or arrange E-visit).  The importance of social distancing was discussed today.  Time:   Today, I have spent 36 minutes with the patient with telehealth technology discussing the above problems.     Medication Adjustments/Labs and Tests Ordered: Current medicines are reviewed at length with the patient today.  Concerns regarding medicines are outlined above.   Tests Ordered: No orders of the defined types were placed in this encounter.   Medication Changes: No orders of the defined types were placed in this encounter.   Follow Up:  In Person in 2 week(s)  Signed, Almyra Deforest, [place]  01/28/2020 12:20 PM    [facility]

## 2020-01-28 NOTE — Patient Instructions (Addendum)
Medication Instructions:  Your physician recommends that you continue on your current medications as directed. Please refer to the Current Medication list given to you today.  *If you need a refill on your cardiac medications before your next appointment, please call your pharmacy*  Lab Work: NONE ordered at this time of appointment   If you have labs (blood work) drawn today and your tests are completely normal, you will receive your results only by: Marland Kitchen MyChart Message (if you have MyChart) OR . A paper copy in the mail If you have any lab test that is abnormal or we need to change your treatment, we will call you to review the results.  Testing/Procedures: Your physician has requested that you have an echocardiogram. Echocardiography is a painless test that uses sound waves to create images of your heart. It provides your doctor with information about the size and shape of your heart and how well your heart's chambers and valves are working. This procedure takes approximately one hour. There are no restrictions for this procedure.   PLEASE SCHEDULE FOR 1-2 WEEKS    Follow-Up: At [facility], you and your health needs are our priority.  As part of our continuing mission to provide you with exceptional heart care, we have created designated Provider Care Teams.  These Care Teams include your primary Cardiologist (physician) and Advanced Practice Providers (APPs -  Physician Assistants and Nurse Practitioners) who all work together to provide you with the care you need, when you need it.  We recommend signing up for the patient portal called "MyChart".  Sign up information is provided on this After Visit Summary.  MyChart is used to connect with patients for Virtual Visits (Telemedicine).  Patients are able to view lab/test results, encounter notes, upcoming appointments, etc.  Non-urgent messages can be sent to your provider as well.   To learn more about what you can do with MyChart, go to  [email or web address].    Your next appointment:   2-3 week(s) after Echocardiogram   The format for your next appointment:   In Person  Provider:   Almyra Deforest, PA-C  Other Instructions   Bring ALL medication bottles with you to your follow up appointment along with your blood pressure machine/cuff, and blood pressure diary.

## 2020-01-28 NOTE — Telephone Encounter (Signed)
  Patient Consent for Virtual Visit         David Lane has provided verbal consent on 01/28/2020 for a virtual visit (video or telephone).   CONSENT FOR VIRTUAL VISIT FOR:  David Lane  By participating in this virtual visit I agree to the following:  I hereby voluntarily request, consent and authorize [facility] and its employed or contracted physicians, physician assistants, nurse practitioners or other licensed health care professionals (the Practitioner), to provide me with telemedicine health care services (the "Services") as deemed necessary by the treating Practitioner. I acknowledge and consent to receive the Services by the Practitioner via telemedicine. I understand that the telemedicine visit will involve communicating with the Practitioner through live audiovisual communication technology and the disclosure of certain medical information by electronic transmission. I acknowledge that I have been given the opportunity to request an in-person assessment or other available alternative prior to the telemedicine visit and am voluntarily participating in the telemedicine visit.  I understand that I have the right to withhold or withdraw my consent to the use of telemedicine in the course of my care at any time, without affecting my right to future care or treatment, and that the Practitioner or I may terminate the telemedicine visit at any time. I understand that I have the right to inspect all information obtained and/or recorded in the course of the telemedicine visit and may receive copies of available information for a reasonable fee.  I understand that some of the potential risks of receiving the Services via telemedicine include:  Marland Kitchen Delay or interruption in medical evaluation due to technological equipment failure or disruption; . Information transmitted may not be sufficient (e.g. poor resolution of images) to allow for appropriate medical decision making by the  Practitioner; and/or  . In rare instances, security protocols could fail, causing a breach of personal health information.  Furthermore, I acknowledge that it is my responsibility to provide information about my medical history, conditions and care that is complete and accurate to the best of my ability. I acknowledge that Practitioner's advice, recommendations, and/or decision may be based on factors not within their control, such as incomplete or inaccurate data provided by me or distortions of diagnostic images or specimens that may result from electronic transmissions. I understand that the practice of medicine is not an exact science and that Practitioner makes no warranties or guarantees regarding treatment outcomes. I acknowledge that a copy of this consent can be made available to me via my patient portal ([facility]), or I can request a printed copy by calling the office of [facility].    I understand that my insurance will be billed for this visit.   I have read or had this consent read to me. . I understand the contents of this consent, which adequately explains the benefits and risks of the Services being provided via telemedicine.  . I have been provided ample opportunity to ask questions regarding this consent and the Services and have had my questions answered to my satisfaction. . I give my informed consent for the services to be provided through the use of telemedicine in my medical care

## 2020-02-10 ENCOUNTER — Other Ambulatory Visit: Payer: Self-pay | Admitting: Endocrinology

## 2020-02-10 ENCOUNTER — Other Ambulatory Visit: Payer: Self-pay | Admitting: Family Medicine

## 2020-02-10 ENCOUNTER — Other Ambulatory Visit: Payer: Self-pay | Admitting: Adult Health

## 2020-02-10 DIAGNOSIS — E089 Diabetes mellitus due to underlying condition without complications: Secondary | ICD-10-CM

## 2020-02-10 DIAGNOSIS — K8689 Other specified diseases of pancreas: Secondary | ICD-10-CM

## 2020-02-10 NOTE — Telephone Encounter (Signed)
1.  Please schedule f/u appt 2.  Then please refill x 1, pending that appt.  

## 2020-02-11 ENCOUNTER — Other Ambulatory Visit: Payer: Self-pay

## 2020-02-11 MED ORDER — ROSUVASTATIN CALCIUM 40 MG PO TABS: ORAL_TABLET | ORAL | 6 refills | Status: DC

## 2020-02-13 ENCOUNTER — Other Ambulatory Visit: Payer: Self-pay

## 2020-02-13 ENCOUNTER — Ambulatory Visit (HOSPITAL_COMMUNITY): Payer: Medicare Other | Attending: Cardiovascular Disease

## 2020-02-13 DIAGNOSIS — I5042 Chronic combined systolic (congestive) and diastolic (congestive) heart failure: Secondary | ICD-10-CM | POA: Insufficient documentation

## 2020-02-13 DIAGNOSIS — I519 Heart disease, unspecified: Secondary | ICD-10-CM | POA: Diagnosis not present

## 2020-02-13 DIAGNOSIS — I255 Ischemic cardiomyopathy: Secondary | ICD-10-CM | POA: Insufficient documentation

## 2020-02-15 ENCOUNTER — Ambulatory Visit: Payer: Medicare Other | Admitting: Physician Assistant

## 2020-02-15 NOTE — Progress Notes (Signed)
Normal pumping function of heart, mild stiffening of the heart muscle consistent with his age, no significant valve issue. Dilated aortic root, but not severe enough to be fixed, will continue to monitor.

## 2020-02-20 DIAGNOSIS — E113551 Type 2 diabetes mellitus with stable proliferative diabetic retinopathy, right eye: Secondary | ICD-10-CM | POA: Diagnosis not present

## 2020-02-20 DIAGNOSIS — E113592 Type 2 diabetes mellitus with proliferative diabetic retinopathy without macular edema, left eye: Secondary | ICD-10-CM | POA: Diagnosis not present

## 2020-02-20 DIAGNOSIS — H4312 Vitreous hemorrhage, left eye: Secondary | ICD-10-CM | POA: Diagnosis not present

## 2020-03-06 ENCOUNTER — Telehealth: Payer: Self-pay | Admitting: Physician Assistant

## 2020-03-06 ENCOUNTER — Ambulatory Visit: Payer: Medicare Other | Admitting: Physician Assistant

## 2020-03-06 NOTE — Telephone Encounter (Signed)
New Message  Pt called and stated that he is unable to make appt this afternoon. Wants to know if he could change to a virtual instead. Please call to discuss

## 2020-03-11 ENCOUNTER — Other Ambulatory Visit: Payer: Self-pay | Admitting: Family Medicine

## 2020-03-19 ENCOUNTER — Other Ambulatory Visit: Payer: Self-pay

## 2020-03-19 ENCOUNTER — Encounter: Payer: Self-pay | Admitting: Physician Assistant

## 2020-03-19 ENCOUNTER — Ambulatory Visit (INDEPENDENT_AMBULATORY_CARE_PROVIDER_SITE_OTHER): Payer: Medicare Other | Admitting: Physician Assistant

## 2020-03-19 VITALS — BP 104/70 | HR 94 | Temp 97.3°F | Ht 72.0 in | Wt 199.6 lb

## 2020-03-19 DIAGNOSIS — I712 Thoracic aortic aneurysm, without rupture, unspecified: Secondary | ICD-10-CM

## 2020-03-19 DIAGNOSIS — I2510 Atherosclerotic heart disease of native coronary artery without angina pectoris: Secondary | ICD-10-CM | POA: Diagnosis not present

## 2020-03-19 DIAGNOSIS — I5042 Chronic combined systolic (congestive) and diastolic (congestive) heart failure: Secondary | ICD-10-CM | POA: Diagnosis not present

## 2020-03-19 DIAGNOSIS — E108 Type 1 diabetes mellitus with unspecified complications: Secondary | ICD-10-CM | POA: Diagnosis not present

## 2020-03-19 DIAGNOSIS — H4312 Vitreous hemorrhage, left eye: Secondary | ICD-10-CM | POA: Diagnosis not present

## 2020-03-19 DIAGNOSIS — E113592 Type 2 diabetes mellitus with proliferative diabetic retinopathy without macular edema, left eye: Secondary | ICD-10-CM | POA: Diagnosis not present

## 2020-03-19 DIAGNOSIS — I959 Hypotension, unspecified: Secondary | ICD-10-CM

## 2020-03-19 DIAGNOSIS — E113551 Type 2 diabetes mellitus with stable proliferative diabetic retinopathy, right eye: Secondary | ICD-10-CM | POA: Diagnosis not present

## 2020-03-19 MED ORDER — LISINOPRIL 10 MG PO TABS: ORAL_TABLET | ORAL | 6 refills | Status: DC

## 2020-03-19 MED ORDER — CARVEDILOL 3.125 MG PO TABS: 3.1250 mg | ORAL_TABLET | Freq: Two times a day (BID) | ORAL | 3 refills | Status: DC

## 2020-03-19 NOTE — Progress Notes (Signed)
Cardiology Office Note:    Date:  03/21/2020   ID:  David, Lane 1946-07-12, MRN [id number]  PCP:  No primary care provider on file.  [facility] Cardiologist:  [name], MD  [facility] Electrophysiologist:  None   Referring MD: Luetta Nutting, DO   No chief complaint on file.   History of Present Illness:    David Lane is a 74 y.o. male with a hx of chronic combined systolic and diastolic heart failure, ischemic cardiomyopathy, CAD, type I DM with peripheral neuropathy, hypertension, pancreatic insufficiency following severe gallstone pancreatitis, PAD s/p transmetatarsal amputation of the left foot, and history of upper GI bleeding requiring blood transfusion in July 2020.  He had anterior STEMI in March 2019 with placement of DES to mid LAD, he also had 80% restenosis in the previous left PDA stent and 75% stenosis distal to RCA stent.  Last echocardiogram obtained on 12/21/2017 showed EF 35 to 40%, grade 1 DD, mildly dilated ascending aorta measuring at 4 cm.  Despite LV dysfunction, he has not had any heart failure symptoms nor does he require any loop with diuretic.  Based on the previous note, patient has significant anxiety issue and often checks his blood pressure multiple times throughout the day.  Due to variability of his blood pressure, heart monitor was ordered.  Since the last visit in November, he went to the ED on 08/21/2019 with diaphoresis.  His blood sugar was normal.  Labs and serial troponin were reassuring.  It was suspected his home CBG management may be contributing to the diaphoresis and recommended for him to see his PCP.  Patient presents today for follow-up, he had a very labile blood pressure recently with his last blood pressure in the 80s.  Patient presents today for cardiology office evaluation.  Based on his home blood pressure diary, his blood pressure has been in the 42H systolic on majority of the days.  He feels very fatigued and  dizzy when his blood pressure drops further into the 80s.  He has not been taking his lisinopril for a while as it worsens his dizzy spell.  I think he is overmedicated, I recommended decrease his carvedilol to 3.125 mg twice daily.  I instructed him to take the lisinopril only on a as needed basis if systolic blood pressures greater than 150.   Past Medical History:  Diagnosis Date  . CAD (coronary artery disease)    last cath by [facility] DR.  Mihai Croitoru showing  some  disease involving LCX and small size of Diag   . CHF exacerbation, due to diastolic dysfunction 03/20/7627  . Chronic systolic CHF (congestive heart failure), NYHA class 1 ([place]) 07/17/2012  . GERD (gastroesophageal reflux disease)   . Hepatic lesion 02/04/11  . Hyperlipemia   . Hypertension   . Ischemic cardiomyopathy    EF 35-40%  . Liver hemangioma   . NSTEMI (non-ST elevated myocardial infarction) ([place]) 11/21/2009  . Pancreatitis 2000's  . Shortness of breath   . Type II diabetes mellitus ([place])     Past Surgical History:  Procedure Laterality Date  . AMPUTATION Left 01/14/2018   Procedure: TRANSMETATARSAL AMPUTATION;  Surgeon: Newt Minion, MD;  Location: [place];  Service: Orthopedics;  Laterality: Left;  . BIOPSY  04/26/2019   Procedure: BIOPSY;  Surgeon: Rush Landmark Telford Nab., MD;  Location: [facility];  Service: Gastroenterology;;  . CARDIAC CATHETERIZATION  2011   minimal disease, medical management  . COLONOSCOPY WITH PROPOFOL  N/A 04/26/2019   Procedure: COLONOSCOPY WITH PROPOFOL;  Surgeon: Rush Landmark Telford Nab., MD;  Location: [facility];  Service: Gastroenterology;  Laterality: N/A;  . CORONARY ANGIOPLASTY WITH STENT PLACEMENT  07/14/2012   successful PCI & stenting of mid LAD & PDA off the dominant CX  . CORONARY/GRAFT ACUTE MI REVASCULARIZATION N/A 12/20/2017   Procedure: Coronary/Graft Acute MI Revascularization;  Surgeon: Martinique, Peter M, MD;  Location: [place] [facility];  Service: Cardiovascular;   Laterality: N/A;  . ENTEROSCOPY N/A 04/25/2019   Procedure: ENTEROSCOPY;  Surgeon: Rush Landmark Telford Nab., MD;  Location: [facility];  Service: Gastroenterology;  Laterality: N/A;  . ESOPHAGOGASTRODUODENOSCOPY Left 04/24/2019   Procedure: ESOPHAGOGASTRODUODENOSCOPY (EGD);  Surgeon: Lavena Bullion, DO;  Location: [facility];  Service: Gastroenterology;  Laterality: Left;  . HEMOSTASIS CLIP PLACEMENT  04/26/2019   Procedure: HEMOSTASIS CLIP PLACEMENT;  Surgeon: Irving Copas., MD;  Location: [facility];  Service: Gastroenterology;;  . HOT HEMOSTASIS N/A 04/26/2019   Procedure: HOT HEMOSTASIS (ARGON PLASMA COAGULATION/BICAP);  Surgeon: Irving Copas., MD;  Location: [facility];  Service: Gastroenterology;  Laterality: N/A;  . INCISION AND DRAINAGE ABSCESS ANAL  1970's  . LEFT HEART CATH AND CORONARY ANGIOGRAPHY N/A 12/20/2017   Procedure: LEFT HEART CATH AND CORONARY ANGIOGRAPHY;  Surgeon: Martinique, Peter M, MD;  Location: [place] [facility];  Service: Cardiovascular;  Laterality: N/A;  . LEFT HEART CATHETERIZATION WITH CORONARY ANGIOGRAM N/A 07/14/2012   Procedure: LEFT HEART CATHETERIZATION WITH CORONARY ANGIOGRAM;  Surgeon: [name], MD;  Location: [facility];  Service: Cardiovascular;  Laterality: N/A;  . PERCUTANEOUS CORONARY STENT INTERVENTION (PCI-S) Right 07/14/2012   Procedure: PERCUTANEOUS CORONARY STENT INTERVENTION (PCI-S);  Surgeon: [name], MD;  [facility];  Service: Cardiovascular;  Laterality: Right;  . POLYPECTOMY  04/26/2019   Procedure: POLYPECTOMY;  Surgeon: Mansouraty, Telford Nab., MD;  Location: [facility];  Service: Gastroenterology;;  . Lia Foyer TATTOO INJECTION  04/25/2019   Procedure: SUBMUCOSAL TATTOO INJECTION;  Surgeon: Irving Copas., MD;  Location: [facility];  Service: Gastroenterology;;    Current Medications: Current Meds  Medication Sig  . aspirin 81 MG tablet Take 1 tablet (81 mg total) by mouth  daily.  . B-D UF III MINI PEN NEEDLES 31G X 5 MM MISC USE TO INJECT INSULIN DAILY  . carvedilol (COREG) 3.125 MG tablet Take 1 tablet (3.125 mg total) by mouth 2 (two) times daily with a meal.  . Cholecalciferol (VITAMIN D3) 50 MCG (2000 UT) TABS Take 1 tablet by mouth daily.  Marland Kitchen escitalopram (LEXAPRO) 5 MG tablet TAKE 1 TABLET BY MOUTH EVERY DAY  . glucose blood (ONE TOUCH ULTRA TEST) test strip USE 1 STRIP TO CHECK GLUCOSE 4 TIMES DAILY; K86.89, E10.8  . Insulin Glargine (LANTUS SOLOSTAR) 100 UNIT/ML Solostar Pen Inject 65 Units into the skin every morning. And pen needles 1/day (Patient taking differently: Inject 68 Units into the skin every morning. And pen needles 1/day)  . lisinopril (ZESTRIL) 10 MG tablet Take 1 tablet as needed if systolic (top number) blood pressure is greater than 150  . Multiple Vitamins-Minerals (CENTRUM SILVER ULTRA MENS PO) Take 1 tablet by mouth daily.   . pantoprazole (PROTONIX) 40 MG tablet TAKE 1 TABLET(40 MG) BY MOUTH TWICE DAILY BEFORE A MEAL  . rosuvastatin (CRESTOR) 40 MG tablet TAKE 1 TABLET(40 MG) BY MOUTH AT BEDTIME  . [DISCONTINUED] carvedilol (COREG) 6.25 MG tablet TAKE 1 TABLET(6.25 MG) BY MOUTH TWICE DAILY  . [DISCONTINUED] lisinopril (ZESTRIL) 10 MG tablet  Take 1 tablet (10 mg total) by mouth daily.     Allergies:   Lipitor [atorvastatin] and Metformin and related   Social History   Socioeconomic History  . Marital status: Single    Spouse name: Not on file  . Number of children: 0  . Years of education: Not on file  . Highest education level: Not on file  Occupational History  . Occupation: retired    Fish farm manager: [employer]  Tobacco Use  . Smoking status: Never Smoker  . Smokeless tobacco: Never Used  Substance and Sexual Activity  . Alcohol use: No  . Drug use: No  . Sexual activity: Never  Other Topics Concern  . Not on file  Social History Narrative  . Not on file   Social Determinants of Health   Financial Resource Strain:     . Difficulty of Paying Living Expenses:   Food Insecurity:   . Worried About Charity fundraiser in the Last Year:   . Arboriculturist in the Last Year:   Transportation Needs:   . Film/video editor (Medical):   Marland Kitchen Lack of Transportation (Non-Medical):   Physical Activity:   . Days of Exercise per Week:   . Minutes of Exercise per Session:   Stress:   . Feeling of Stress :   Social Connections:   . Frequency of Communication with Friends and Family:   . Frequency of Social Gatherings with Friends and Family:   . Attends Religious Services:   . Active Member of Clubs or Organizations:   . Attends Archivist Meetings:   Marland Kitchen Marital Status:      Family History: The patient's family history includes Diabetes in his maternal grandfather, mother, and paternal grandmother. There is no history of Colon cancer.  ROS:   Please see the history of present illness.     All other systems reviewed and are negative.  EKGs/Labs/Other Studies Reviewed:    The following studies were reviewed today:  Echo 02/13/2020 1. Left ventricular ejection fraction, by estimation, is 55 to 60%. The  left ventricle has normal function. The left ventricle has no regional  wall motion abnormalities. Left ventricular diastolic parameters are  consistent with Grade I diastolic  dysfunction (impaired relaxation).  2. Right ventricular systolic function is normal. The right ventricular  size is normal. There is normal pulmonary artery systolic pressure.  3. Left atrial size was mildly dilated.  4. The mitral valve is normal in structure. No evidence of mitral valve  regurgitation. No evidence of mitral stenosis.  5. The aortic valve is tricuspid. Aortic valve regurgitation is mild. No  aortic stenosis is present.  6. Aortic dilatation noted. Aneurysm of the aortic root, measuring 40 mm.  There is mild dilatation of the ascending aorta measuring 44 mm.  7. The inferior vena cava is normal  in size with greater than 50%  respiratory variability, suggesting right atrial pressure of 3 mmHg.  EKG:  EKG is not ordered today.    Recent Labs: 08/21/2019: ALT 47; BUN 24; Creatinine, Ser 1.25; Hemoglobin 13.9; Platelets 236; Potassium 4.2; Sodium 137; TSH 2.426  Recent Lipid Panel    Component Value Date/Time   CHOL 102 12/21/2017 0345   TRIG 70 12/21/2017 0345   HDL 24 (L) 12/21/2017 0345   CHOLHDL 4.3 12/21/2017 0345   VLDL 14 12/21/2017 0345   LDLCALC 64 12/21/2017 0345    Physical Exam:    VS:  BP 104/70  Pulse 94   Temp (!) 97.3 F (36.3 C)   Ht 6' (1.829 m)   Wt 199 lb 9.6 oz (90.5 kg)   SpO2 96%   BMI 27.07 kg/m     Wt Readings from Last 3 Encounters:  03/19/20 199 lb 9.6 oz (90.5 kg)  01/28/20 195 lb (88.5 kg)  08/27/19 193 lb (87.5 kg)     GEN:  Well nourished, well developed in no acute distress HEENT: Normal NECK: No JVD; No carotid bruits LYMPHATICS: No lymphadenopathy CARDIAC: RRR, no murmurs, rubs, gallops RESPIRATORY:  Clear to auscultation without rales, wheezing or rhonchi  ABDOMEN: Soft, non-tender, non-distended MUSCULOSKELETAL:  No edema; No deformity  SKIN: Warm and dry NEUROLOGIC:  Alert and oriented x 3 PSYCHIATRIC:  Normal affect   ASSESSMENT:    1. Hypotension, unspecified hypotension type   2. Chronic combined systolic and diastolic heart failure ([place])   3. Coronary artery disease involving native coronary artery of native heart without angina pectoris   4. Type 1 diabetes mellitus with complications (HCC)   5. Thoracic aortic aneurysm without rupture ([place])    PLAN:    In order of problems listed above:  1. Hypotension: He carries a previous diagnosis of hypertension, however recently he has been having significant dizzy spell especially in the morning.  I recently saw the patient via virtual visit, at that time I suspect he was overmedicated.  He brought with him extensive blood pressure diary for the past month.  Majority  of the time, it seems his systolic blood pressure has been in the 90s with occasional systolic blood pressure down to the 80s.  He says he feels worse when he take his lisinopril, therefore he has only been taking his lisinopril on rare occasions.  I recommended decreasing his carvedilol to 3.125 mg twice daily.  He can use the lisinopril as needed for systolic blood pressure greater than 150 mmHg.  2. Chronic combined systolic and diastolic heart failure: Euvolemic on exam  3. CAD: No recent angina.  On aspirin, Crestor and carvedilol.  4. Type I DM: On insulin, managed by primary care provider  5. Thoracic aortic aneurysm: Thoracic aorta measuring at 44 mm on recent echocardiogram.   Medication Adjustments/Labs and Tests Ordered: Current medicines are reviewed at length with the patient today.  Concerns regarding medicines are outlined above.  No orders of the defined types were placed in this encounter.  Meds ordered this encounter  Medications  . carvedilol (COREG) 3.125 MG tablet    Sig: Take 1 tablet (3.125 mg total) by mouth 2 (two) times daily with a meal.    Dispense:  180 tablet    Refill:  3  . lisinopril (ZESTRIL) 10 MG tablet    Sig: Take 1 tablet as needed if systolic (top number) blood pressure is greater than 150    Dispense:  30 tablet    Refill:  6    Patient Instructions  Medication Instructions:   DECREASE CARVEDILOL (COREG) TO 3.125 MG 2 TIMES A DAY  TAKE LISINOPRIL AS NEEDED IF SYSTOLIC (TOP) NUMBER OF BLOOD PRESSURE IS GREATER THAN 150  *If you need a refill on your cardiac medications before your next appointment, please call your pharmacy*  Lab Work: NONE ordered at this time of appointment   If you have labs (blood work) drawn today and your tests are completely normal, you will receive your results only by: Marland Kitchen MyChart Message (if you have MyChart) OR . A paper copy  in the mail If you have any lab test that is abnormal or we need to change your  treatment, we will call you to review the results.  Testing/Procedures: NONE ordered at this time of appointment   Follow-Up: At [facility], you and your health needs are our priority.  As part of our continuing mission to provide you with exceptional heart care, we have created designated Provider Care Teams.  These Care Teams include your primary Cardiologist (physician) and Advanced Practice Providers (APPs -  Physician Assistants and Nurse Practitioners) who all work together to provide you with the care you need, when you need it.  We recommend signing up for the patient portal called "MyChart".  Sign up information is provided on this After Visit Summary.  MyChart is used to connect with patients for Virtual Visits (Telemedicine).  Patients are able to view lab/test results, encounter notes, upcoming appointments, etc.  Non-urgent messages can be sent to your provider as well.   To learn more about what you can do with MyChart, go to [email or web address].    Your next appointment:   2-3 month(s)  The format for your next appointment:   In Person  Provider:   [name], MD  Other Instructions      Signed, Almyra Deforest, [place]  03/21/2020 11:31 PM    [place]

## 2020-03-19 NOTE — Patient Instructions (Signed)
Medication Instructions:   DECREASE CARVEDILOL (COREG) TO 3.125 MG 2 TIMES A DAY  TAKE LISINOPRIL AS NEEDED IF SYSTOLIC (TOP) NUMBER OF BLOOD PRESSURE IS GREATER THAN 150  *If you need a refill on your cardiac medications before your next appointment, please call your pharmacy*  Lab Work: NONE ordered at this time of appointment   If you have labs (blood work) drawn today and your tests are completely normal, you will receive your results only by: Marland Kitchen MyChart Message (if you have MyChart) OR . A paper copy in the mail If you have any lab test that is abnormal or we need to change your treatment, we will call you to review the results.  Testing/Procedures: NONE ordered at this time of appointment   Follow-Up: At [facility], you and your health needs are our priority.  As part of our continuing mission to provide you with exceptional heart care, we have created designated Provider Care Teams.  These Care Teams include your primary Cardiologist (physician) and Advanced Practice Providers (APPs -  Physician Assistants and Nurse Practitioners) who all work together to provide you with the care you need, when you need it.  We recommend signing up for the patient portal called "MyChart".  Sign up information is provided on this After Visit Summary.  MyChart is used to connect with patients for Virtual Visits (Telemedicine).  Patients are able to view lab/test results, encounter notes, upcoming appointments, etc.  Non-urgent messages can be sent to your provider as well.   To learn more about what you can do with MyChart, go to [email or web address].    Your next appointment:   2-3 month(s)  The format for your next appointment:   In Person  Provider:   [name], MD  Other Instructions

## 2020-04-10 ENCOUNTER — Other Ambulatory Visit: Payer: Self-pay | Admitting: Family Medicine

## 2020-04-20 ENCOUNTER — Other Ambulatory Visit: Payer: Self-pay | Admitting: Endocrinology

## 2020-04-20 DIAGNOSIS — E089 Diabetes mellitus due to underlying condition without complications: Secondary | ICD-10-CM

## 2020-04-20 DIAGNOSIS — K8689 Other specified diseases of pancreas: Secondary | ICD-10-CM

## 2020-05-10 ENCOUNTER — Other Ambulatory Visit: Payer: Self-pay | Admitting: Family Medicine

## 2020-05-14 DIAGNOSIS — E113593 Type 2 diabetes mellitus with proliferative diabetic retinopathy without macular edema, bilateral: Secondary | ICD-10-CM | POA: Diagnosis not present

## 2020-05-14 DIAGNOSIS — H2511 Age-related nuclear cataract, right eye: Secondary | ICD-10-CM | POA: Diagnosis not present

## 2020-05-14 DIAGNOSIS — H4311 Vitreous hemorrhage, right eye: Secondary | ICD-10-CM | POA: Diagnosis not present

## 2020-06-09 ENCOUNTER — Other Ambulatory Visit: Payer: Self-pay | Admitting: Family Medicine

## 2020-06-10 ENCOUNTER — Other Ambulatory Visit: Payer: Self-pay | Admitting: Nurse Practitioner

## 2020-06-17 NOTE — Telephone Encounter (Signed)
Called patient to schedule appointment for follow up, no answer unable to LM.

## 2020-06-17 NOTE — Telephone Encounter (Signed)
Called patient to schedule appt no answer unable to LM

## 2020-06-19 DIAGNOSIS — H2511 Age-related nuclear cataract, right eye: Secondary | ICD-10-CM | POA: Diagnosis not present

## 2020-06-19 DIAGNOSIS — E113591 Type 2 diabetes mellitus with proliferative diabetic retinopathy without macular edema, right eye: Secondary | ICD-10-CM | POA: Diagnosis not present

## 2020-06-19 DIAGNOSIS — H18413 Arcus senilis, bilateral: Secondary | ICD-10-CM | POA: Diagnosis not present

## 2020-06-19 DIAGNOSIS — Z961 Presence of intraocular lens: Secondary | ICD-10-CM | POA: Diagnosis not present

## 2020-06-23 DIAGNOSIS — H4311 Vitreous hemorrhage, right eye: Secondary | ICD-10-CM | POA: Diagnosis not present

## 2020-06-23 DIAGNOSIS — H2511 Age-related nuclear cataract, right eye: Secondary | ICD-10-CM | POA: Diagnosis not present

## 2020-06-23 DIAGNOSIS — E113511 Type 2 diabetes mellitus with proliferative diabetic retinopathy with macular edema, right eye: Secondary | ICD-10-CM | POA: Diagnosis not present

## 2020-06-24 DIAGNOSIS — H31091 Other chorioretinal scars, right eye: Secondary | ICD-10-CM | POA: Diagnosis not present

## 2020-07-01 DIAGNOSIS — E113593 Type 2 diabetes mellitus with proliferative diabetic retinopathy without macular edema, bilateral: Secondary | ICD-10-CM | POA: Diagnosis not present

## 2020-07-09 ENCOUNTER — Other Ambulatory Visit: Payer: Self-pay | Admitting: Cardiovascular Disease

## 2020-07-09 ENCOUNTER — Other Ambulatory Visit: Payer: Self-pay | Admitting: Family Medicine

## 2020-07-09 NOTE — Telephone Encounter (Signed)
Rx request sent to pharmacy.  

## 2020-07-18 ENCOUNTER — Other Ambulatory Visit: Payer: Self-pay | Admitting: Endocrinology

## 2020-07-18 DIAGNOSIS — K8689 Other specified diseases of pancreas: Secondary | ICD-10-CM

## 2020-07-18 DIAGNOSIS — E089 Diabetes mellitus due to underlying condition without complications: Secondary | ICD-10-CM

## 2020-07-18 NOTE — Telephone Encounter (Signed)
1.  Please schedule f/u appt 2.  Then please refill x 2 mos, pending that appt.  

## 2020-07-23 DIAGNOSIS — H4311 Vitreous hemorrhage, right eye: Secondary | ICD-10-CM | POA: Diagnosis not present

## 2020-07-23 DIAGNOSIS — E113593 Type 2 diabetes mellitus with proliferative diabetic retinopathy without macular edema, bilateral: Secondary | ICD-10-CM | POA: Diagnosis not present

## 2020-07-24 ENCOUNTER — Other Ambulatory Visit: Payer: Self-pay | Admitting: Family Medicine

## 2020-08-08 ENCOUNTER — Other Ambulatory Visit: Payer: Self-pay | Admitting: Cardiovascular Disease

## 2020-08-14 ENCOUNTER — Other Ambulatory Visit: Payer: Self-pay

## 2020-08-14 ENCOUNTER — Telehealth: Payer: Self-pay | Admitting: Endocrinology

## 2020-08-14 ENCOUNTER — Encounter: Payer: Self-pay | Admitting: Endocrinology

## 2020-08-14 ENCOUNTER — Other Ambulatory Visit: Payer: Self-pay | Admitting: *Deleted

## 2020-08-14 ENCOUNTER — Ambulatory Visit (INDEPENDENT_AMBULATORY_CARE_PROVIDER_SITE_OTHER): Payer: Medicare Other | Admitting: Endocrinology

## 2020-08-14 VITALS — BP 172/84 | HR 78 | Ht 72.0 in | Wt 207.4 lb

## 2020-08-14 DIAGNOSIS — E089 Diabetes mellitus due to underlying condition without complications: Secondary | ICD-10-CM | POA: Diagnosis not present

## 2020-08-14 DIAGNOSIS — K8689 Other specified diseases of pancreas: Secondary | ICD-10-CM

## 2020-08-14 LAB — POCT GLYCOSYLATED HEMOGLOBIN (HGB A1C): Hemoglobin A1C: 8.8 % — AB (ref 4.0–5.6)

## 2020-08-14 MED ORDER — LANTUS SOLOSTAR 100 UNIT/ML ~~LOC~~ SOPN: 68.0000 [IU] | PEN_INJECTOR | SUBCUTANEOUS | 3 refills | Status: DC

## 2020-08-14 MED ORDER — INSULIN ASPART 100 UNIT/ML ~~LOC~~ SOLN: 10.0000 [IU] | Freq: Three times a day (TID) | SUBCUTANEOUS | 99 refills | Status: DC

## 2020-08-14 MED ORDER — NOVOLOG FLEXPEN 100 UNIT/ML ~~LOC~~ SOPN: 2.0000 [IU] | PEN_INJECTOR | Freq: Three times a day (TID) | SUBCUTANEOUS | 11 refills | Status: DC | PRN

## 2020-08-14 MED ORDER — BD PEN NEEDLE MINI U/F 31G X 5 MM MISC: 0 refills | Status: DC

## 2020-08-14 NOTE — Telephone Encounter (Signed)
OK 

## 2020-08-14 NOTE — Telephone Encounter (Signed)
This ok to sent? Please advise

## 2020-08-14 NOTE — Patient Instructions (Signed)
Please continue the same Lantus. Please Limit the Novolog to 2 units at a time To prevent the blood sugar from going low in the morning, you should eat a snack at bedtime.   check your blood sugar twice a day.  vary the time of day when you check, between before the 3 meals, and at bedtime.  also check if you have symptoms of your blood sugar being too high or too low.  please keep a record of the readings and bring it to your next appointment here (or you can bring the meter itself).  You can write it on any piece of paper.  please call us sooner if your blood sugar goes below 70, or if you have a lot of readings over 200. Please come back for a follow-up appointment in 3 months.

## 2020-08-14 NOTE — Telephone Encounter (Signed)
Patient would like his Novolog and his Lantus sent to: [facility] [phone] - [address], [place] Phone:  [phone]  Fax:  [phone]     Rather than the [facility] in [place] please.  - Patient also states she he does not normally get the Novolog in a flexpen, he wanted it in a bottle. And also he needs the B-D mini pen needles called in as well. (5 mm X 31 G)

## 2020-08-14 NOTE — Progress Notes (Signed)
Subjective:    Patient ID: David Lane, male    DOB: Aug 08, 1946, 74 y.o.   MRN: [id number]  HPI Pt returns for f/u of diabetes mellitus: DM type: due to pancreatic insufficiency.  Dx'ed: 1988, during an episode of pancreatitis.   Complications: CAD, DR, PAD, left TMA, and PN.  Therapy: insulin since 1990.   DKA: never.   Severe hypoglycemia: last episode was in 2014; he says this was probably due to taking more than the prescribed insulin dosage.  SDOH: in 2014, he was changed to qd lantus, for further simplification of his insulin schedule.  V-GO pump was considered, but pt twice did not keep appt for it; he is retired.   Interval history: no cbg record, but states cbg's vary from 62-340.  It is in general higher as the day goes on.  He takes Lantus, 68 units qam, and PRN Novolog (total 0-10 units/d).  Pt says he never misses the insulin.  pt states he feels well in general.  Past Medical History:  Diagnosis Date  . CAD (coronary artery disease)    last cath by [facility].  Mihai Croitoru showing  some  disease involving LCX and small size of Diag   . CHF exacerbation, due to diastolic dysfunction 78/67/6720  . Chronic systolic CHF (congestive heart failure), NYHA class 1 ([place]) 07/17/2012  . GERD (gastroesophageal reflux disease)   . Hepatic lesion 02/04/11  . Hyperlipemia   . Hypertension   . Ischemic cardiomyopathy    EF 35-40%  . Liver hemangioma   . NSTEMI (non-ST elevated myocardial infarction) ([place]) 11/21/2009  . Pancreatitis 2000's  . Shortness of breath   . Type II diabetes mellitus ([place])     Past Surgical History:  Procedure Laterality Date  . AMPUTATION Left 01/14/2018   Procedure: TRANSMETATARSAL AMPUTATION;  Surgeon: Newt Minion, MD;  Location: [facility];  Service: Orthopedics;  Laterality: Left;  . BIOPSY  04/26/2019   Procedure: BIOPSY;  Surgeon: Rush Landmark Telford Nab., MD;  Location: [facility];  Service: Gastroenterology;;  . CARDIAC CATHETERIZATION  2011    minimal disease, medical management  . COLONOSCOPY WITH PROPOFOL N/A 04/26/2019   Procedure: COLONOSCOPY WITH PROPOFOL;  Surgeon: Rush Landmark Telford Nab., MD;  Location: [facility];  Service: Gastroenterology;  Laterality: N/A;  . CORONARY ANGIOPLASTY WITH STENT PLACEMENT  07/14/2012   successful PCI & stenting of mid LAD & PDA off the dominant CX  . CORONARY/GRAFT ACUTE MI REVASCULARIZATION N/A 12/20/2017   Procedure: Coronary/Graft Acute MI Revascularization;  Surgeon: Martinique, Peter M, MD;  Location: [facility];  Service: Cardiovascular;  Laterality: N/A;  . ENTEROSCOPY N/A 04/25/2019   Procedure: ENTEROSCOPY;  Surgeon: Rush Landmark Telford Nab., MD;  Location: [facility];  Service: Gastroenterology;  Laterality: N/A;  . ESOPHAGOGASTRODUODENOSCOPY Left 04/24/2019   Procedure: ESOPHAGOGASTRODUODENOSCOPY (EGD);  Surgeon: Lavena Bullion, DO;  Location: [facility];  Service: Gastroenterology;  Laterality: Left;  . HEMOSTASIS CLIP PLACEMENT  04/26/2019   Procedure: HEMOSTASIS CLIP PLACEMENT;  Surgeon: Irving Copas., MD;  Location: [facility];  Service: Gastroenterology;;  . HOT HEMOSTASIS N/A 04/26/2019   Procedure: HOT HEMOSTASIS (ARGON PLASMA COAGULATION/BICAP);  Surgeon: Irving Copas., MD;  Location: [facility];  Service: Gastroenterology;  Laterality: N/A;  . INCISION AND DRAINAGE ABSCESS ANAL  1970's  . LEFT HEART CATH AND CORONARY ANGIOGRAPHY N/A 12/20/2017   Procedure: LEFT HEART CATH AND CORONARY ANGIOGRAPHY;  Surgeon: Martinique, Peter M, MD;  Location: [facility];  Service:  Cardiovascular;  Laterality: N/A;  . LEFT HEART CATHETERIZATION WITH CORONARY ANGIOGRAM N/A 07/14/2012   Procedure: LEFT HEART CATHETERIZATION WITH CORONARY ANGIOGRAM;  Surgeon: [name], MD;  Location: [facility];  Service: Cardiovascular;  Laterality: N/A;  . PERCUTANEOUS CORONARY STENT INTERVENTION (PCI-S) Right 07/14/2012   Procedure: PERCUTANEOUS CORONARY STENT  INTERVENTION (PCI-S);  Surgeon: [name], MD;  [facility];  Service: Cardiovascular;  Laterality: Right;  . POLYPECTOMY  04/26/2019   Procedure: POLYPECTOMY;  Surgeon: Mansouraty, Telford Nab., MD;  Location: [facility];  Service: Gastroenterology;;  . Lia Foyer TATTOO INJECTION  04/25/2019   Procedure: SUBMUCOSAL TATTOO INJECTION;  Surgeon: Irving Copas., MD;  Location: [facility];  Service: Gastroenterology;;    Social History   Socioeconomic History  . Marital status: Single    Spouse name: Not on file  . Number of children: 0  . Years of education: Not on file  . Highest education level: Not on file  Occupational History  . Occupation: retired    Fish farm manager: [employer]  Tobacco Use  . Smoking status: Never Smoker  . Smokeless tobacco: Never Used  Substance and Sexual Activity  . Alcohol use: No  . Drug use: No  . Sexual activity: Never  Other Topics Concern  . Not on file  Social History Narrative  . Not on file   Social Determinants of Health   Financial Resource Strain:   . Difficulty of Paying Living Expenses: Not on file  Food Insecurity:   . Worried About Charity fundraiser in the Last Year: Not on file  . Ran Out of Food in the Last Year: Not on file  Transportation Needs:   . Lack of Transportation (Medical): Not on file  . Lack of Transportation (Non-Medical): Not on file  Physical Activity:   . Days of Exercise per Week: Not on file  . Minutes of Exercise per Session: Not on file  Stress:   . Feeling of Stress : Not on file  Social Connections:   . Frequency of Communication with Friends and Family: Not on file  . Frequency of Social Gatherings with Friends and Family: Not on file  . Attends Religious Services: Not on file  . Active Member of Clubs or Organizations: Not on file  . Attends Archivist Meetings: Not on file  . Marital Status: Not on file  Intimate Partner Violence:   . Fear of Current or Ex-Partner:  Not on file  . Emotionally Abused: Not on file  . Physically Abused: Not on file  . Sexually Abused: Not on file    Current Outpatient Medications on File Prior to Visit  Medication Sig Dispense Refill  . aspirin 81 MG tablet Take 1 tablet (81 mg total) by mouth daily. 30 tablet   . carvedilol (COREG) 3.125 MG tablet Take 1 tablet (3.125 mg total) by mouth 2 (two) times daily with a meal. 180 tablet 3  . Cholecalciferol (VITAMIN D3) 50 MCG (2000 UT) TABS Take 1 tablet by mouth daily.    Marland Kitchen escitalopram (LEXAPRO) 5 MG tablet TAKE 1 TABLET BY MOUTH EVERY DAY 30 tablet 0  . glucose blood (ONE TOUCH ULTRA TEST) test strip USE 1 STRIP TO CHECK GLUCOSE 4 TIMES DAILY; K86.89, E10.8 150 each 11  . lisinopril (ZESTRIL) 10 MG tablet TAKE 1 TABLET(10 MG) BY MOUTH DAILY 90 tablet 1  . Multiple Vitamins-Minerals (CENTRUM SILVER ULTRA MENS PO) Take 1 tablet by mouth daily.     Marland Kitchen  pantoprazole (PROTONIX) 40 MG tablet TAKE 1 TABLET(40 MG) BY MOUTH TWICE DAILY BEFORE A MEAL 180 tablet 0  . rosuvastatin (CRESTOR) 40 MG tablet TAKE 1 TABLET(40 MG) BY MOUTH AT BEDTIME 30 tablet 6   No current facility-administered medications on file prior to visit.    Allergies  Allergen Reactions  . Lipitor [Atorvastatin] Other (See Comments)    Muscle weakness  . Metformin And Related Diarrhea    Severe diarrhea    Family History  Problem Relation Age of Onset  . Diabetes Mother   . Diabetes Paternal Grandmother   . Diabetes Maternal Grandfather   . Colon cancer Neg Hx     BP (!) 172/84   Pulse 78   Ht 6' (1.829 m)   Wt 207 lb 6.4 oz (94.1 kg)   SpO2 97%   BMI 28.13 kg/m    Review of Systems Denies LOC    Objective:   Physical Exam VITAL SIGNS:  See vs page GENERAL: no distress Pulses: dorsalis pedis absent bilat.   MSK: left transmetatarsal amputation.   CV:1+ bilat leg edema.   Skin:  no ulcer on the feet.  normal color and temp on the feet Neuro: sensation is intact to touch on the feet, but  decreased from normal.   Ext: there is severeonychomycosis of the right foot toenails.   A1c=8.8%     Assessment & Plan:  DM, due to pancreatic insufficiency. I advised pt to increase Lantus and minimize the Novolog, but he declines.   Hypoglycemia, due to insulin: this limits aggressiveness of glycemic control  Patient Instructions  Please continue the same Lantus. Please Limit the Novolog to 2 units at a time To prevent the blood sugar from going low in the morning, you should eat a snack at bedtime.   check your blood sugar twice a day.  vary the time of day when you check, between before the 3 meals, and at bedtime.  also check if you have symptoms of your blood sugar being too high or too low.  please keep a record of the readings and bring it to your next appointment here (or you can bring the meter itself).  You can write it on any piece of paper.  please call us sooner if your blood sugar goes below 70, or if you have a lot of readings over 200. Please come back for a follow-up appointment in 3 months.

## 2020-08-14 NOTE — Telephone Encounter (Signed)
Patient called back to follow up on call from this morning. Please advise.

## 2020-08-14 NOTE — Telephone Encounter (Signed)
Sent Rx to [facility] [phone] - [place], [place]

## 2020-08-19 ENCOUNTER — Other Ambulatory Visit: Payer: Self-pay

## 2020-08-19 ENCOUNTER — Telehealth: Payer: Self-pay | Admitting: Endocrinology

## 2020-08-19 DIAGNOSIS — K8689 Other specified diseases of pancreas: Secondary | ICD-10-CM

## 2020-08-19 DIAGNOSIS — E089 Diabetes mellitus due to underlying condition without complications: Secondary | ICD-10-CM

## 2020-08-19 MED ORDER — INSULIN ASPART 100 UNIT/ML ~~LOC~~ SOLN: 10.0000 [IU] | Freq: Three times a day (TID) | SUBCUTANEOUS | 99 refills | Status: DC

## 2020-08-19 NOTE — Telephone Encounter (Signed)
RX sent to pharmacy  

## 2020-08-19 NOTE — Telephone Encounter (Addendum)
Pt request:  [place]:  [facility] on [address] [phone] Pt says sugars in the 300s.

## 2020-08-19 NOTE — Progress Notes (Signed)
humalog is ok

## 2020-08-20 ENCOUNTER — Other Ambulatory Visit: Payer: Self-pay

## 2020-08-20 MED ORDER — INSULIN LISPRO 100 UNIT/ML ~~LOC~~ SOLN: SUBCUTANEOUS | 3 refills | Status: DC

## 2020-08-20 MED ORDER — INSULIN LISPRO 100 UNIT/ML ~~LOC~~ SOLN
SUBCUTANEOUS | 3 refills | Status: DC
Start: 2020-08-20 — End: 2020-10-27

## 2020-08-22 IMAGING — DX PORTABLE CHEST - 1 VIEW
1 series · 1 of 1 positions shown · non-contrast
Comparison: 01/12/2018

CLINICAL DATA: Weakness, GI bleed

EXAM:
PORTABLE CHEST 1 VIEW

[chest ap]
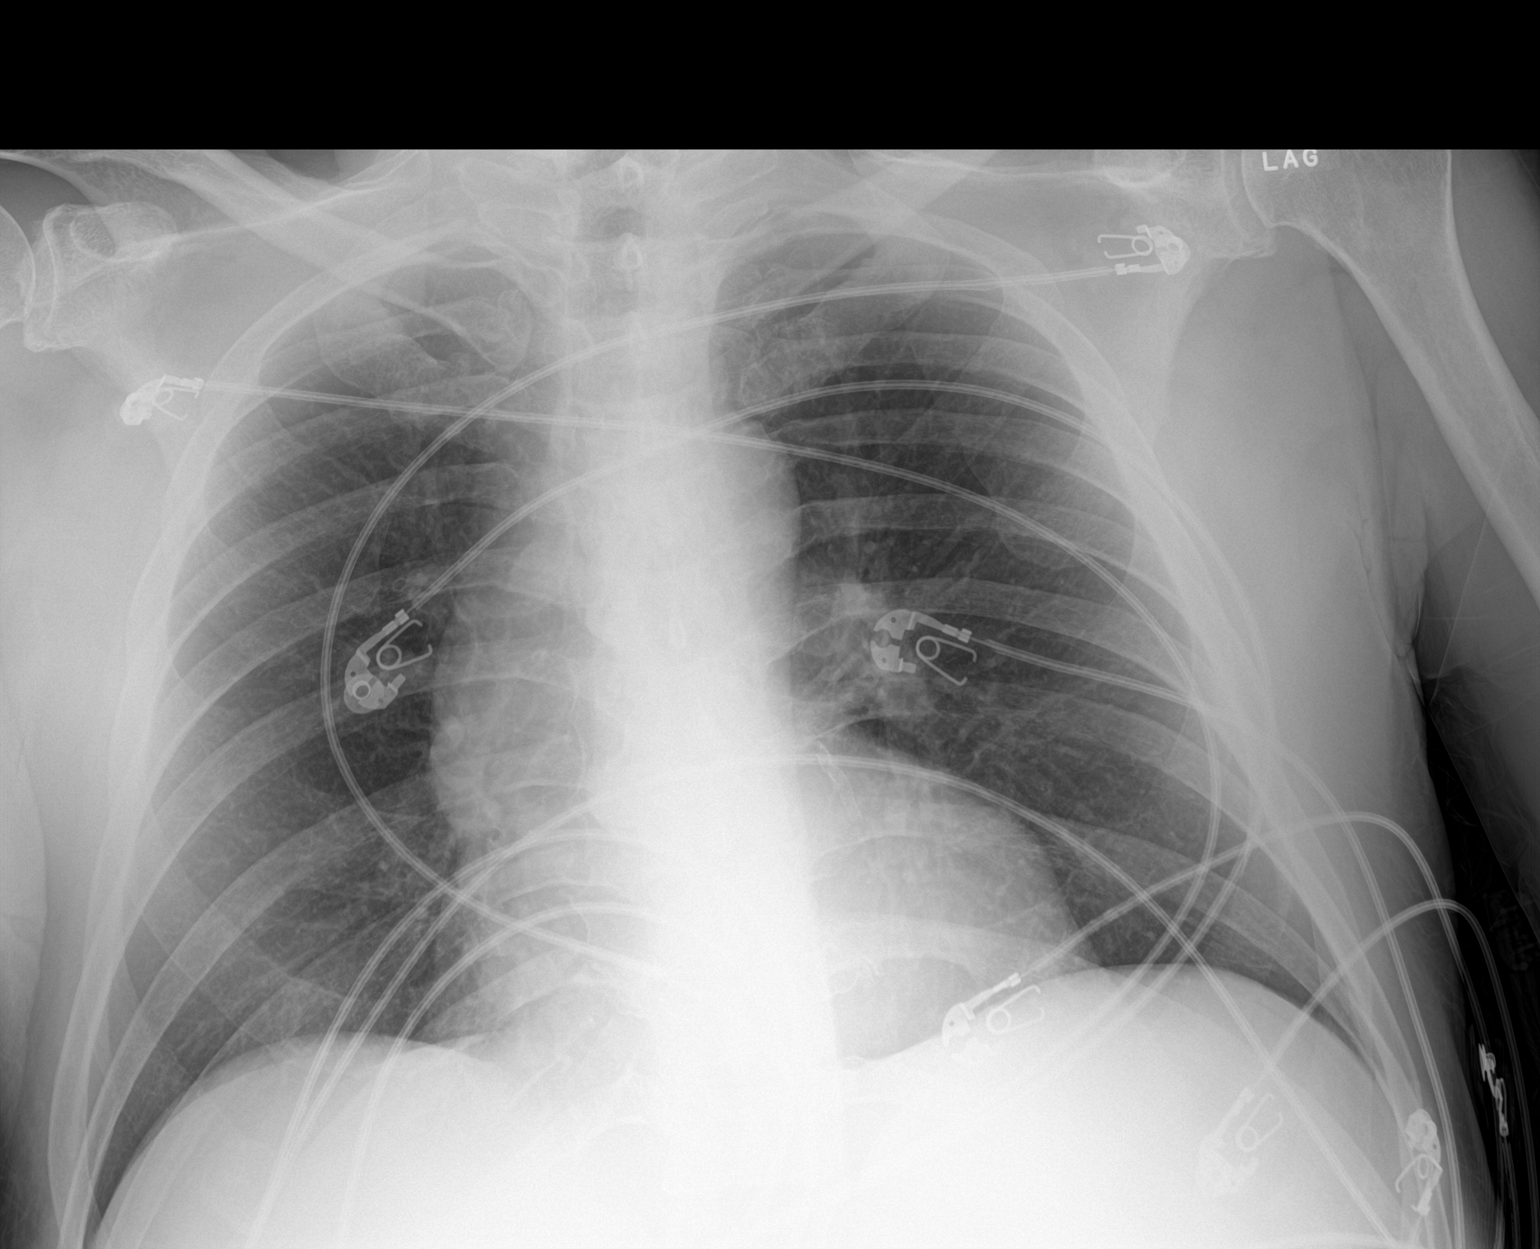

[1 of 1 positions shown; findings below may reference images not displayed]

FINDINGS: The heart size and mediastinal contours are within normal limits.
Left coronary stent. Both lungs are clear. The visualized skeletal
structures are unremarkable.
IMPRESSION: No active disease.

## 2020-08-23 IMAGING — CT CT ABDOMEN AND PELVIS WITH CONTRAST
1 of 2 series · 15 of 32 positions shown, 19 images · IV contrast (APPLIED)
Comparison: MRI abdomen from 9959

CLINICAL DATA: Nausea, vomiting with loose dark stools and coffee
ground emesis. Prior history of pancreatitis.

EXAM:
CT ABDOMEN AND PELVIS WITH CONTRAST
TECHNIQUE: Multidetector CT imaging of the abdomen and pelvis was performed
using the standard protocol following bolus administration of
intravenous contrast.
CONTRAST:  100mL OMNIPAQUE IOHEXOL 300 MG/ML  SOLN

[Series 2: axial st · axial · 0.87mm/px · z∈[-500,-25]mm · 15 of 105 slices shown, 19 images]
[im 5/105  soft-tissue]
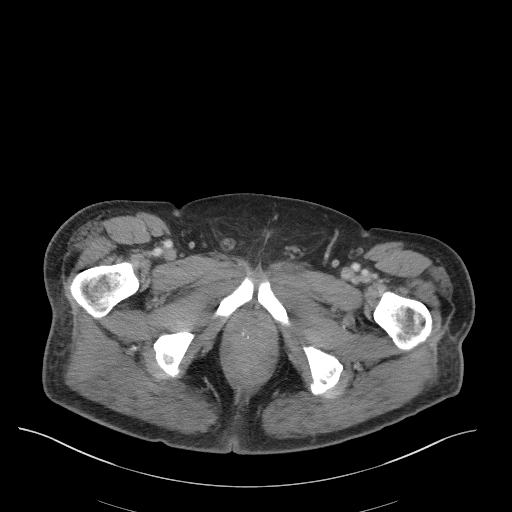
[im 5/105  bone]
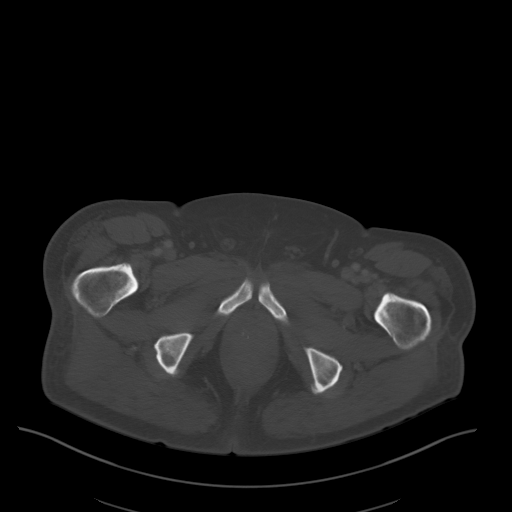
[im 14/105  soft-tissue]
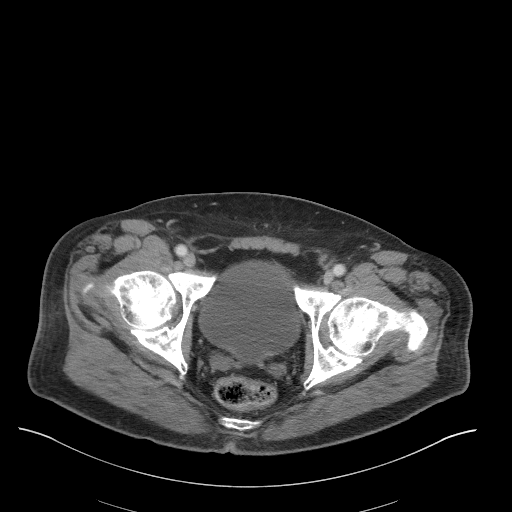
[im 22/105  soft-tissue]
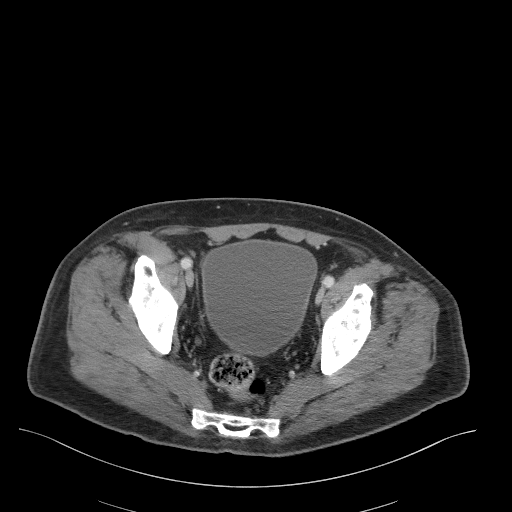
[im 31/105  soft-tissue]
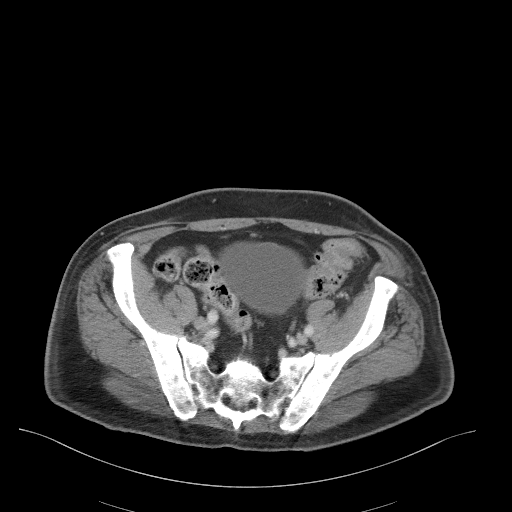
[im 35/105  soft-tissue]
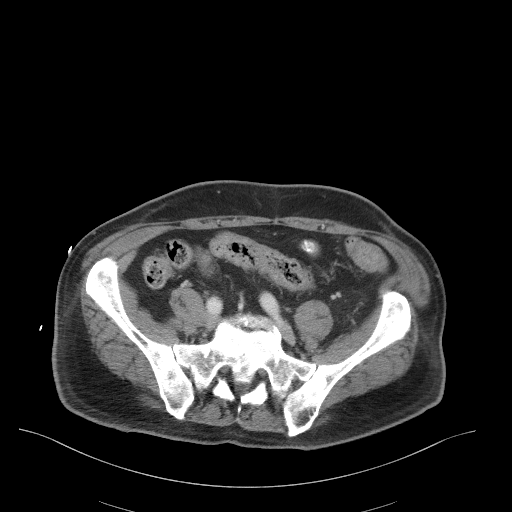
[im 44/105  soft-tissue]
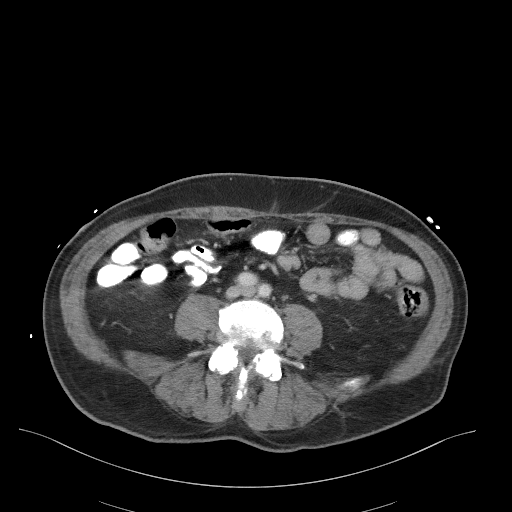
[im 53/105  soft-tissue]
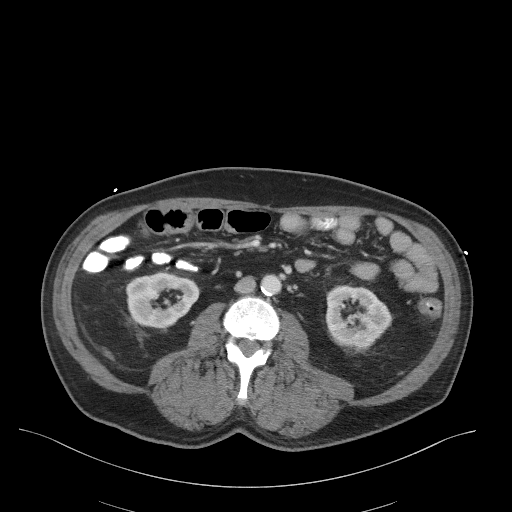
[im 61/105  soft-tissue]
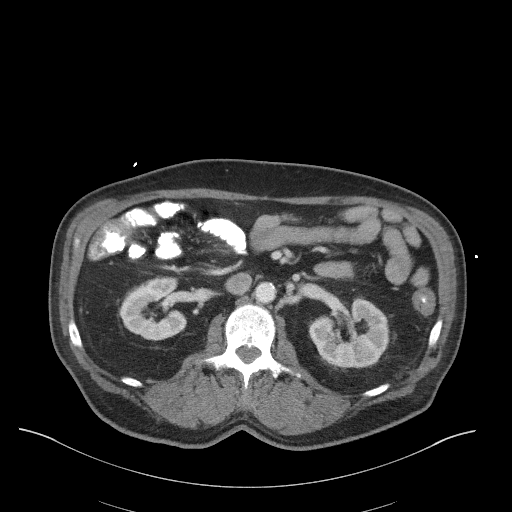
[im 70/105  soft-tissue]
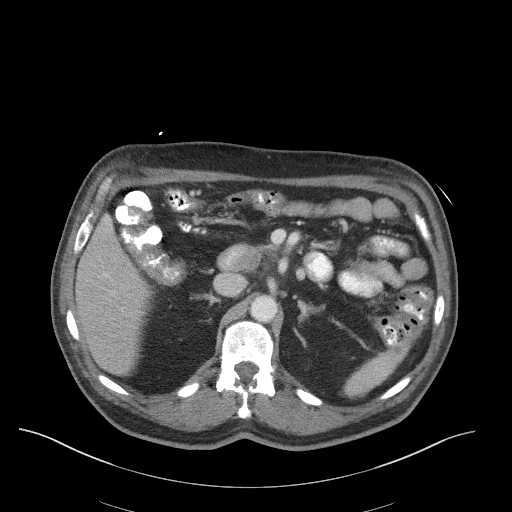
[im 70/105  bone]
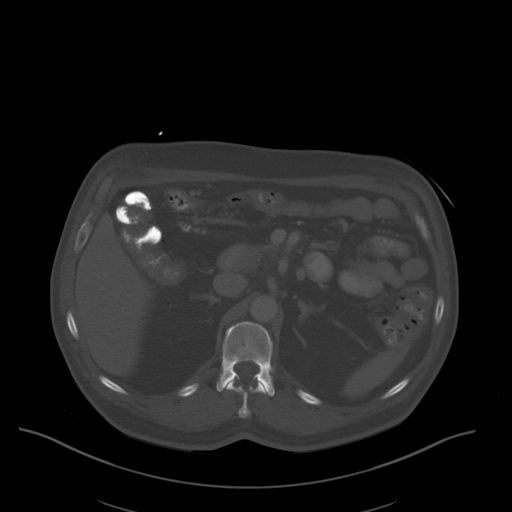
[im 74/105  soft-tissue]
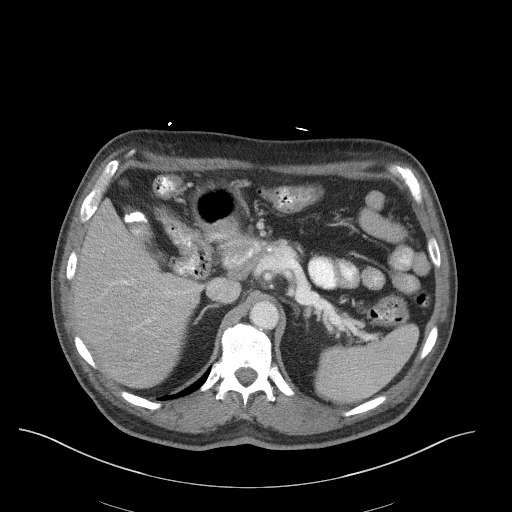
[im 83/105  soft-tissue]
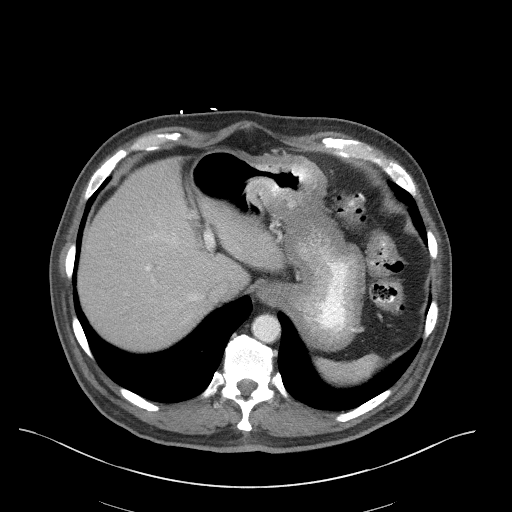
[im 87/105  lung]
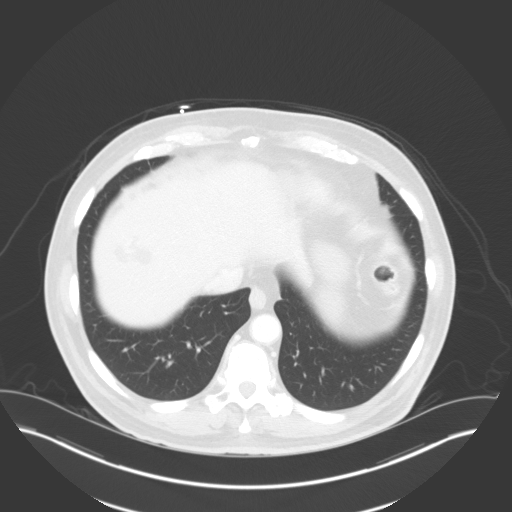
[im 92/105  soft-tissue]
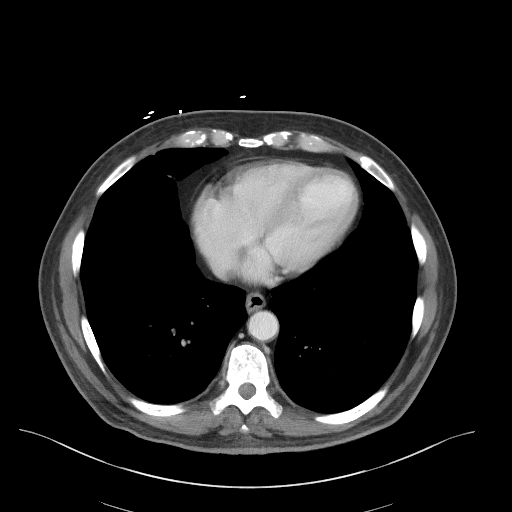
[im 92/105  lung]
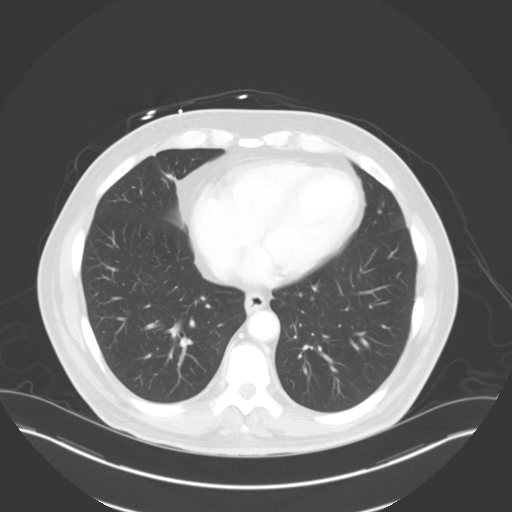
[im 96/105  lung]
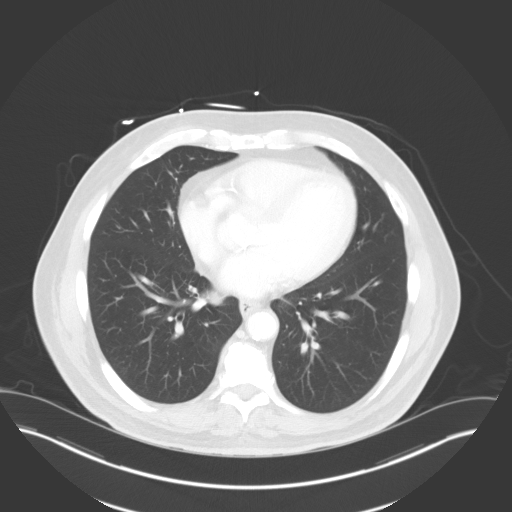
[im 100/105  soft-tissue]
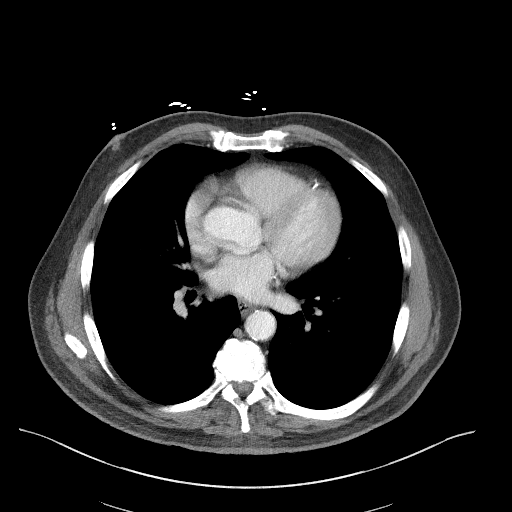
[im 100/105  lung]
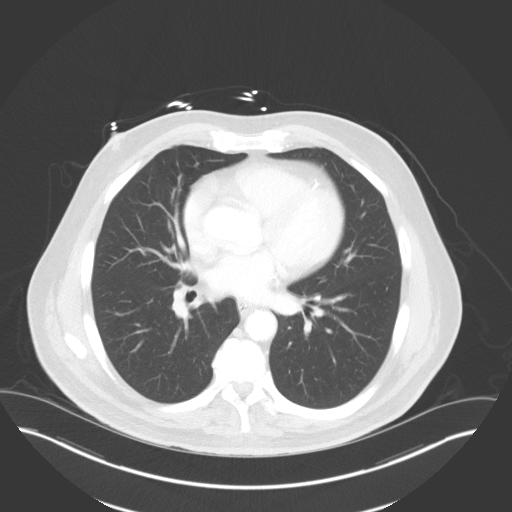

[15 of 32 positions shown; findings below may reference images not displayed]

FINDINGS: Lower chest: The lung bases are clear of acute process. No pleural
effusion or pulmonary lesions. The heart is normal in size. No
pericardial effusion. The distal esophagus and aorta are
unremarkable.

Hepatobiliary: 3.5 cm lobulated segment 8 liver lesion consistent
with known hepatic hemangioma. Typical nodular peripheral
enhancement and unchanged since 9959.

Pancreas: Marked pancreatic atrophy and diffuse calcifications
consistent with chronic calcific pancreatitis. No findings for acute
inflammation and no mass. No worrisome hepatic lesions or
intrahepatic biliary dilatation. The gallbladder is unremarkable. No
common bile duct dilatation.

Spleen: Normal size.  No focal lesions.

Adrenals/Urinary Tract: The adrenal glands and kidneys are
unremarkable. No renal masses or hydronephrosis. The bladder is
unremarkable.

Stomach/Bowel: The stomach, duodenum, small bowel and colon are
unremarkable. No acute inflammatory changes, mass lesions or
obstructive findings.

Vascular/Lymphatic: The aorta and branch vessels are patent.
Scattered atherosclerotic calcifications. The major venous
structures are patent.

No mesenteric or retroperitoneal mass or adenopathy.

Reproductive: Mild prostate gland enlargement.

Other: No pelvic mass or adenopathy. No free pelvic fluid
collections. No inguinal mass or adenopathy. No abdominal wall
hernia or subcutaneous lesions.

Musculoskeletal: No significant bony findings.
IMPRESSION: 1. No acute abdominal/pelvic findings, mass lesions or adenopathy.
2. Stable right hepatic lobe hemangioma.
3. Changes of chronic calcific pancreatitis but no definite findings
for acute inflammation.
4. Three-vessel coronary artery calcifications.

## 2020-09-17 ENCOUNTER — Other Ambulatory Visit: Payer: Self-pay | Admitting: Cardiovascular Disease

## 2020-10-16 ENCOUNTER — Telehealth (INDEPENDENT_AMBULATORY_CARE_PROVIDER_SITE_OTHER): Payer: Medicare Other | Admitting: Family Medicine

## 2020-10-16 DIAGNOSIS — R0981 Nasal congestion: Secondary | ICD-10-CM

## 2020-10-16 DIAGNOSIS — U071 COVID-19: Secondary | ICD-10-CM | POA: Diagnosis not present

## 2020-10-16 MED ORDER — BENZONATATE 100 MG PO CAPS: 100.0000 mg | ORAL_CAPSULE | Freq: Three times a day (TID) | ORAL | 0 refills | Status: DC | PRN

## 2020-10-16 NOTE — Patient Instructions (Addendum)
  HOME CARE TIPS:  -I sent the medication(s) we discussed to your pharmacy: Meds ordered this encounter  Medications  . benzonatate (TESSALON PERLES) 100 MG capsule    Sig: Take 1 capsule (100 mg total) by mouth 3 (three) times daily as needed.    Dispense:  20 capsule    Refill:  0     -I sent a message to the outpatient Covid treatment center letting them know that you are interested in treatment. Currently there are very limited treatment doses available and the are calling the folks that are at the highest risk of hospitalization or severe disease first.   -can use tylenol if needed for fevers, aches and pains per instructions  -can use nasal saline a few times per day if you have nasal congestion  -stay hydrated, drink plenty of fluids and eat small healthy meals - avoid dairy  -can take 1000 IU (96mcg) Vit D3 and 100-500 mg of Vit C daily per instructions  -If the Covid test is positive, check out the CDC website for more information on home care, transmission and treatment for COVID19  -follow up with your doctor in 2-3 days unless improving and feeling better  -stay home while sick, except to seek medical care, and if you have [place] ideally it would be best to stay home for a full 10 days since the onset of symptoms PLUS one day of no fever and feeling better. Wear a good mask (such as N95 or KN95) if around others to reduce the risk of transmission.  It was nice to meet you today, and I really hope you are feeling better soon. I help [facility] out with telemedicine visits on Tuesdays and Thursdays and am available for visits on those days. If you have any concerns or questions following this visit please schedule a follow up visit with your Primary Care doctor or seek care at a local urgent care clinic to avoid delays in care.    Seek in person care at the Urgent Care promptly if your symptoms worsen, you are having chest pain, difficulty breathing, feel very tired, new  concerns arise or you are not improving with treatment. Call 911 and/or seek emergency care if your symptoms are severe or life threatening.

## 2020-10-16 NOTE — Progress Notes (Signed)
Virtual Visit via Telephone Note  I connected with David Lane on 10/16/20 at  6:00 PM EST by telephone and verified that I am speaking with the correct person using two identifiers.   I discussed the limitations, risks, security and privacy concerns of performing an evaluation and management service by telephone and the availability of in person appointments. I also discussed with the patient that there may be a patient responsible charge related to this service. The patient expressed understanding and agreed to proceed.  Location patient: home, [place] Location provider: work or home office Participants present for the call: patient, provider Patient did not have a visit with me in the prior 7 days to address this/these issue(s).   History of Present Illness:  Acute telemedicine visit for COVID19: -Onset: 1 week ago -Symptoms include: PND, scratchy throat, sore throat, sinus congestion, chills intermittently - O2> 95 at home, not much of a cough -reports he feels better now - feels good -girlfriend has covid -he had a positive covid test yesterday -Denies: fevers, CP, SOB, NVD, inability to get out of bed/eat/drink -Has tried: nothing -Pertinent past medical history: DM, CAD, CHF -Pertinent medication allergies: lipitor, metformin -COVID-19 vaccine status: not vaccinated for covid   Observations/Objective: Patient sounds cheerful and well on the phone. I do not appreciate any SOB. Speech and thought processing are grossly intact. Patient reported vitals:  Assessment and Plan:  Nasal congestion  COVID-19 - Plan: Ambulatory referral for Covid Treatment  -we discussed possible serious and likely etiologies, options for evaluation and workup, limitations of telemedicine visit vs in person visit, treatment, treatment risks and precautions. Pt prefers to treat via telemedicine empirically rather than in person at this moment.  Discussed treatment options, potential complications,  isolation and precautions at length.  He had many questions.  He wanted a referral for outpatient COVID treatment.  Did discuss the ideal treatment window and treatment shortage and that he would be contacted by the outpatient treatment center only if doses were available and they felt he was in the proper treatment window for available treatments.  Did place referral per his request to the [facility] health outpatient treatment center, did note in referral request that treating providers would need to carefully review his past medical history and medications to ensure no contraindications or interactions, depending on the treatment available.  He is not very clear on the onset date of his symptoms, thinks was 1 week ago, but is unsure.  In the interim discussed symptomatic care with nasal saline, Tessalon sent for cough, and other home measures summarized in patient instructions. Scheduled follow up with PCP offered: He agrees to follow-up if needed.  Reports his current PCP Luetta Nutting moved to a different location and he is currently working to get another PCP at his current site. Advised to seek prompt in person care if worsening, new symptoms arise, or if is not improving with treatment. Advised of options for inperson care in case PCP office not available. Did let the patient know that I only do telemedicine shifts for [facility] on Tuesdays and Thursdays and advised a follow up visit with PCP or at an [facility] if has further questions or concerns.   Follow Up Instructions:  I did not refer this patient for an OV with me in the next 24 hours for this/these issue(s).  I discussed the assessment and treatment plan with the patient. The patient was provided an opportunity to ask questions and all were answered. The patient agreed  with the plan and demonstrated an understanding of the instructions.   I spent  30 minutes minutes on the date of this visit in the care of this patient. See summary of tasks completed to  properly care for this patient in the detailed notes above and including counseling, review of PMH, medications, allergies, evaluation of the patient and ordering and instructing patient on testing and care options.     Lucretia Kern, DO

## 2020-10-23 ENCOUNTER — Other Ambulatory Visit: Payer: Self-pay

## 2020-10-27 ENCOUNTER — Other Ambulatory Visit: Payer: Self-pay

## 2020-10-27 ENCOUNTER — Encounter: Payer: Self-pay | Admitting: Family Medicine

## 2020-10-27 ENCOUNTER — Ambulatory Visit (INDEPENDENT_AMBULATORY_CARE_PROVIDER_SITE_OTHER): Payer: Medicare Other | Admitting: Family Medicine

## 2020-10-27 VITALS — BP 132/74 | HR 91 | Temp 97.2°F | Ht 72.0 in | Wt 203.0 lb

## 2020-10-27 DIAGNOSIS — K8689 Other specified diseases of pancreas: Secondary | ICD-10-CM

## 2020-10-27 DIAGNOSIS — E089 Diabetes mellitus due to underlying condition without complications: Secondary | ICD-10-CM

## 2020-10-27 DIAGNOSIS — F419 Anxiety disorder, unspecified: Secondary | ICD-10-CM | POA: Diagnosis not present

## 2020-10-27 DIAGNOSIS — K2100 Gastro-esophageal reflux disease with esophagitis, without bleeding: Secondary | ICD-10-CM

## 2020-10-27 DIAGNOSIS — I5042 Chronic combined systolic (congestive) and diastolic (congestive) heart failure: Secondary | ICD-10-CM

## 2020-10-27 DIAGNOSIS — U071 COVID-19: Secondary | ICD-10-CM | POA: Diagnosis not present

## 2020-10-27 MED ORDER — PANTOPRAZOLE SODIUM 40 MG PO TBEC: 40.0000 mg | DELAYED_RELEASE_TABLET | Freq: Every day | ORAL | 0 refills | Status: DC

## 2020-10-27 MED ORDER — PANCRELIPASE (LIP-PROT-AMYL) 36000-114000 UNITS PO CPEP: ORAL_CAPSULE | ORAL | 11 refills | Status: DC

## 2020-10-27 MED ORDER — ESCITALOPRAM OXALATE 5 MG PO TABS: 5.0000 mg | ORAL_TABLET | Freq: Every day | ORAL | 0 refills | Status: DC

## 2020-10-27 MED ORDER — INSULIN LISPRO 100 UNIT/ML ~~LOC~~ SOLN: SUBCUTANEOUS | 3 refills | Status: DC

## 2020-10-27 NOTE — Progress Notes (Signed)
[facility] [address] [place] [address] Dept: [phone] Dept Fax: [phone]  New Patient Office Visit  Subjective:    Patient ID: David Lane, male    DOB: September 12, 1946, 75 y.o..   MRN: [id number]   Chief Complaint  Patient presents with  . Transitions Of Care    TOC from Dr. Zigmund Daniel, follow up from positive covid few weeks ago.     History of Present Illness:  Patient is in today to establish care. Mr. Kuang has had considerable heart issues. He has had prior CAD, with stent placement in his LAD and PDA. He now has some chronic heart failure secondary to ischemic cardiomyopathy. He is currently managed by Dr. Sallyanne Kuster (cardiology). In addition, he has a history of prior severe pancreatitis (possibly due to a CBD stone), with resulting pancreatic insufficiency and diabetes mellitus, now requiring insulin.  Today, he expresses some concern about his need to use insulin. He notes he has been reading about Diabetes on line and notes some issues he has seen about the impacts of insulin on the immune system. He is currently managed with Lantus 68 units daily and Novolog 10 units TID PRN. His diabetes has been managed by Dr. Loanne Drilling (endocrinology). He had been previously taking Creon, but is currently out of this medication. He notes that he was having constipation issues when he was taking a higher dose, so sometimes he only takes 1 tablet with meals.  Mr. Riel had a positive COVID test with associated chills, fever and cough earlier in Jan. This has resolved at this point. He has not had the COVID vaccine and has had some hesitancies about this.  Mr. Valente was started by a previous provider on Lexapro. He states the provider had noted him to be fairly anxious appearing . Mr. Steinborn notes that at times it feels like the world is closing in on him. He has not necessarily felt depressed. He is interested in resuming the  Lexapro.  Past Medical History: Patient Active Problem List   Diagnosis Date Noted  . Coronary artery disease involving native coronary artery of native heart without angina pectoris 06/21/2019  . AVM (arteriovenous malformation) of colon   . History of colonic polyps   . Duodenitis   . Gastroesophageal reflux disease with esophagitis   . Acute upper GI bleed 04/23/2019  . Dyslipidemia (high LDL; low HDL) 07/01/2018  . Labile blood glucose   . Functional gait disorder 01/20/2018  . Acute blood loss anemia 01/20/2018  . Status post transmetatarsal amputation of left foot ([place]) 01/20/2018  . Foot ulcer due to secondary DM ([place]) 01/04/2018  . Status post coronary artery stent placement   . Acute ST elevation myocardial infarction (STEMI) involving left anterior descending (LAD) coronary artery ([place]) 12/20/2017  . 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 ([place]) 12/14/2015  . Wellness examination 11/25/2014  . Screening for prostate cancer 11/25/2014  . Pancreatic insufficiency 01/25/2013  . Ischemic cardiomyopathy, EF 50% by echo 08/01/13 07/19/2012  . Chronic combined systolic and diastolic CHF (congestive heart failure) ([place]) 07/14/2012  . Pulmonary edema, most likely due to diastolic dysfunction 02/40/9735  . CAD, 07/14/12- LAD/PDA DES   . Anxiety 12/12/2009  . Hyperlipidemia 04/24/2007  . Essential hypertension 04/24/2007  . PANCREATITIS, HX OF 04/24/2007   Past Surgical History:  Procedure Laterality Date  . AMPUTATION Left 01/14/2018   Procedure: TRANSMETATARSAL AMPUTATION;  Surgeon: Newt Minion, MD;  Location: [facility];  Service:  Orthopedics;  Laterality: Left;  . BIOPSY  04/26/2019   Procedure: BIOPSY;  Surgeon: Rush Landmark Telford Nab., MD;  Location: [facility];  Service: Gastroenterology;;  . CARDIAC CATHETERIZATION  2011   minimal disease, medical management  . COLONOSCOPY WITH PROPOFOL N/A 04/26/2019   Procedure: COLONOSCOPY WITH PROPOFOL;  Surgeon:  Rush Landmark Telford Nab., MD;  [facility]: [facility];  Service: Gastroenterology;  Laterality: N/A;  . CORONARY ANGIOPLASTY WITH STENT PLACEMENT  07/14/2012   successful PCI & stenting of mid LAD & PDA off the dominant CX  . CORONARY/GRAFT ACUTE MI REVASCULARIZATION N/A 12/20/2017   Procedure: Coronary/Graft Acute MI Revascularization;  Surgeon: Martinique, Peter M, MD;  Location: [facility];  Service: Cardiovascular;  Laterality: N/A;  . ENTEROSCOPY N/A 04/25/2019   Procedure: ENTEROSCOPY;  Surgeon: Rush Landmark Telford Nab., MD;  [facility];  Service: Gastroenterology;  Laterality: N/A;  . ESOPHAGOGASTRODUODENOSCOPY Left 04/24/2019   Procedure: ESOPHAGOGASTRODUODENOSCOPY (EGD);  Surgeon: Lavena Bullion, DO;  [facility];  Service: Gastroenterology;  Laterality: Left;  . HEMOSTASIS CLIP PLACEMENT  04/26/2019   Procedure: HEMOSTASIS CLIP PLACEMENT;  Surgeon: Irving Copas., MD;  [facility];  Service: Gastroenterology;;  . HOT HEMOSTASIS N/A 04/26/2019   Procedure: HOT HEMOSTASIS (ARGON PLASMA COAGULATION/BICAP);  Surgeon: Irving Copas., MD;  Location: [facility];  Service: Gastroenterology;  Laterality: N/A;  . INCISION AND DRAINAGE ABSCESS ANAL  1970's  . LEFT HEART CATH AND CORONARY ANGIOGRAPHY N/A 12/20/2017   Procedure: LEFT HEART CATH AND CORONARY ANGIOGRAPHY;  Surgeon: Martinique, Peter M, MD;  Location: [facility];  Service: Cardiovascular;  Laterality: N/A;  . LEFT HEART CATHETERIZATION WITH CORONARY ANGIOGRAM N/A 07/14/2012   Procedure: LEFT HEART CATHETERIZATION WITH CORONARY ANGIOGRAM;  Surgeon: [name], MD;  Location: [facility];  Service: Cardiovascular;  Laterality: N/A;  . PERCUTANEOUS CORONARY STENT INTERVENTION (PCI-S) Right 07/14/2012   Procedure: PERCUTANEOUS CORONARY STENT INTERVENTION (PCI-S);  Surgeon: [name], MD;  Location: [facility];  Service: Cardiovascular;  Laterality: Right;  . POLYPECTOMY   04/26/2019   Procedure: POLYPECTOMY;  Surgeon: Mansouraty, Telford Nab., MD;  Location: [facility];  Service: Gastroenterology;;  . Lia Foyer TATTOO INJECTION  04/25/2019   Procedure: SUBMUCOSAL TATTOO INJECTION;  Surgeon: Irving Copas., MD;  Location: [facility];  Service: Gastroenterology;;   Family History  Problem Relation Age of Onset  . Diabetes Mother   . Diabetes Paternal Grandmother   . Diabetes Maternal Grandfather   . Colon cancer Neg Hx    Outpatient Medications Prior to Visit  Medication Sig Dispense Refill  . aspirin 81 MG tablet Take 1 tablet (81 mg total) by mouth daily. 30 tablet   . benzonatate (TESSALON PERLES) 100 MG capsule Take 1 capsule (100 mg total) by mouth 3 (three) times daily as needed. 20 capsule 0  . carvedilol (COREG) 3.125 MG tablet Take 1 tablet (3.125 mg total) by mouth 2 (two) times daily with a meal. 180 tablet 3  . Cholecalciferol (VITAMIN D3) 50 MCG (2000 UT) TABS Take 1 tablet by mouth daily.    Marland Kitchen glucose blood (ONE TOUCH ULTRA TEST) test strip USE 1 STRIP TO CHECK GLUCOSE 4 TIMES DAILY; K86.89, E10.8 150 each 11  . insulin glargine (LANTUS SOLOSTAR) 100 UNIT/ML Solostar Pen Inject 68 Units into the skin every morning. And pen needles 1/day 70 mL 3  . Insulin Pen Needle (B-D UF III MINI PEN NEEDLES) 31G X 5 MM MISC USE TO INJECT INSULIN DAILY 100 each 0  .  lisinopril (ZESTRIL) 10 MG tablet TAKE 1 TABLET(10 MG) BY MOUTH DAILY 90 tablet 1  . Multiple Vitamins-Minerals (CENTRUM SILVER ULTRA MENS PO) Take 1 tablet by mouth daily.     . rosuvastatin (CRESTOR) 40 MG tablet TAKE 1 TABLET(40 MG) BY MOUTH AT BEDTIME 30 tablet 6  . insulin lispro (HUMALOG) 100 UNIT/ML injection Inject 2 units into the skin 3 times daily as needed for high blood sugar (over 300) E08.9 10 mL 3  . escitalopram (LEXAPRO) 5 MG tablet TAKE 1 TABLET BY MOUTH EVERY DAY 30 tablet 0  . pantoprazole (PROTONIX) 40 MG tablet TAKE 1 TABLET(40 MG) BY MOUTH TWICE DAILY BEFORE A  MEAL 180 tablet 0   No facility-administered medications prior to visit.   Allergies  Allergen Reactions  . Lipitor [Atorvastatin] Other (See Comments)    Muscle weakness  . Metformin And Related Diarrhea    Severe diarrhea     Objective:   Today's Vitals   10/27/20 1418  BP: 132/74  Pulse: 91  Temp: (!) 97.2 F (36.2 C)  TempSrc: Temporal  SpO2: 97%  Weight: 203 lb (92.1 kg)  Height: 6' (1.829 m)   Body mass index is 27.53 kg/m.   General: Well developed, well nourished. No acute distress. Psych: Alert and oriented. Normal mood and affect.  Health Maintenance Due  Topic Date Due  . Hepatitis C Screening  Never done  . OPHTHALMOLOGY EXAM  Never done  . PNA vac Low Risk Adult (2 of 2 - PPSV23) 12/05/2015     Assessment & Plan:   1. Pancreatic insufficiency I will renew his prescription for Creon.  - lipase/protease/amylase (CREON) 36000 UNITS CPEP capsule; Take 2 capsules (72,000 Units total) by mouth 3 (three) times daily with meals. May also take 1 capsule (36,000 Units total) as needed (with snacks).  Dispense: 240 capsule; Refill: 11  2. 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 [facility]) I discussed with Mr. Iddings about how his diabetes differs from usual Type 2 diabetes. Although Type 2 is more common and likely the subject of some of his reading, his diabetes due to pancreatic insufficiency makes insulin critical for his survival. He is willing to continue his medication. The instructions on his current prescription noted 2 units of Novolg TID. As this was different than the label on his current medication, I updated his Rx to match his current dosing.  - insulin lispro (HUMALOG) 100 UNIT/ML injection; Inject 10 units into the skin 3 times daily as needed for high blood sugar (over 300) E08.9  Dispense: 10 mL; Refill: 3  3. Anxiety I will renew the Lexapro per the patient's request.  - escitalopram (LEXAPRO) 5 MG tablet; Take 1 tablet (5 mg total)  by mouth daily.  Dispense: 30 tablet; Refill: 0  4. Gastroesophageal reflux disease with esophagitis without hemorrhage Well controlled on medication.  - pantoprazole (PROTONIX) 40 MG tablet; Take 1 tablet (40 mg total) by mouth daily.  Dispense: 90 tablet; Refill: 0  5. COVID-19 No current symptoms, so this appears resolved. We did discuss the benefits of vaccination. I discussed with him about where he can seek a vaccination in the community.    Haydee Salter, MD

## 2020-11-18 ENCOUNTER — Other Ambulatory Visit: Payer: Self-pay | Admitting: Family Medicine

## 2020-11-18 ENCOUNTER — Other Ambulatory Visit: Payer: Self-pay

## 2020-11-20 ENCOUNTER — Ambulatory Visit (INDEPENDENT_AMBULATORY_CARE_PROVIDER_SITE_OTHER): Payer: Medicare Other | Admitting: Endocrinology

## 2020-11-20 ENCOUNTER — Other Ambulatory Visit: Payer: Self-pay

## 2020-11-20 VITALS — BP 160/80 | HR 91 | Ht 72.0 in | Wt 204.4 lb

## 2020-11-20 DIAGNOSIS — E089 Diabetes mellitus due to underlying condition without complications: Secondary | ICD-10-CM

## 2020-11-20 DIAGNOSIS — K8689 Other specified diseases of pancreas: Secondary | ICD-10-CM | POA: Diagnosis not present

## 2020-11-20 LAB — POCT GLYCOSYLATED HEMOGLOBIN (HGB A1C): Hemoglobin A1C: 8.2 % — AB (ref 4.0–5.6)

## 2020-11-20 NOTE — Progress Notes (Signed)
Subjective:    Patient ID: David Lane, male    DOB: Oct 25, 1945, 75 y.o.   MRN: [id number]  HPI Pt returns for f/u of diabetes mellitus: DM type: due to pancreatic insufficiency.  Dx'ed: 1988, during an episode of pancreatitis.   Complications: CAD, DR, PAD, left TMA, and PN.  Therapy: insulin since 1990.   DKA: never.   Severe hypoglycemia: last episode was in 2014; he says this was probably due to taking more than the prescribed insulin dosage.  SDOH: in 2014, he was changed to qd lantus, for further simplification of his insulin schedule.  V-GO pump was considered, but pt twice did not keep appt for it; he is retired.   Interval history: no cbg record, but states cbg's vary from 67-300.  It is in general higher as the day goes on.  He takes Lantus, 68 units qam, and PRN Novolog (averages 4 total units/d).  Pt says he never misses the insulin.  pt states he feels well in general.   Past Medical History:  Diagnosis Date  . CAD (coronary artery disease)    last cath by [facility] DR.  Mihai Croitoru showing  some  disease involving LCX and small size of Diag   . CHF exacerbation, due to diastolic dysfunction 18/29/9371  . Chronic systolic CHF (congestive heart failure), NYHA class 1 ([place]) 07/17/2012  . GERD (gastroesophageal reflux disease)   . Hepatic lesion 02/04/11  . Hyperlipemia   . Hypertension   . Ischemic cardiomyopathy    EF 35-40%  . Liver hemangioma   . NSTEMI (non-ST elevated myocardial infarction) ([place]) 11/21/2009  . Pancreatitis 2000's  . Shortness of breath   . Type II diabetes mellitus ([place])     Past Surgical History:  Procedure Laterality Date  . AMPUTATION Left 01/14/2018   Procedure: TRANSMETATARSAL AMPUTATION;  Surgeon: Newt Minion, MD;  Location: [facility];  Service: Orthopedics;  Laterality: Left;  . BIOPSY  04/26/2019   Procedure: BIOPSY;  Surgeon: Rush Landmark Telford Nab., MD;  Location: [facility];  Service: Gastroenterology;;  . CARDIAC CATHETERIZATION   2011   minimal disease, medical management  . COLONOSCOPY WITH PROPOFOL N/A 04/26/2019   Procedure: COLONOSCOPY WITH PROPOFOL;  Surgeon: Rush Landmark Telford Nab., MD;  [facility];  Service: Gastroenterology;  Laterality: N/A;  . CORONARY ANGIOPLASTY WITH STENT PLACEMENT  07/14/2012   successful PCI & stenting of mid LAD & PDA off the dominant CX  . CORONARY/GRAFT ACUTE MI REVASCULARIZATION N/A 12/20/2017   Procedure: Coronary/Graft Acute MI Revascularization;  Surgeon: Martinique, Peter M, MD;  Location: [facility];  Service: Cardiovascular;  Laterality: N/A;  . ENTEROSCOPY N/A 04/25/2019   Procedure: ENTEROSCOPY;  Surgeon: Rush Landmark Telford Nab., MD;  [facility];  Service: Gastroenterology;  Laterality: N/A;  . ESOPHAGOGASTRODUODENOSCOPY Left 04/24/2019   Procedure: ESOPHAGOGASTRODUODENOSCOPY (EGD);  Surgeon: Lavena Bullion, DO;  [facility];  Service: Gastroenterology;  Laterality: Left;  . HEMOSTASIS CLIP PLACEMENT  04/26/2019   Procedure: HEMOSTASIS CLIP PLACEMENT;  Surgeon: Irving Copas., MD;  [facility];  Service: Gastroenterology;;  . HOT HEMOSTASIS N/A 04/26/2019   Procedure: HOT HEMOSTASIS (ARGON PLASMA COAGULATION/BICAP);  Surgeon: Irving Copas., MD;  Location: [facility];  Service: Gastroenterology;  Laterality: N/A;  . INCISION AND DRAINAGE ABSCESS ANAL  1970's  . LEFT HEART CATH AND CORONARY ANGIOGRAPHY N/A 12/20/2017   Procedure: LEFT HEART CATH AND CORONARY ANGIOGRAPHY;  Surgeon: Martinique, Peter M, MD;  Location: [facility];  Service: Cardiovascular;  Laterality: N/A;  . LEFT HEART CATHETERIZATION WITH CORONARY ANGIOGRAM N/A 07/14/2012   Procedure: LEFT HEART CATHETERIZATION WITH CORONARY ANGIOGRAM;  Surgeon: [name], MD;  Location: [facility];  Service: Cardiovascular;  Laterality: N/A;  . PERCUTANEOUS CORONARY STENT INTERVENTION (PCI-S) Right 07/14/2012   Procedure: PERCUTANEOUS CORONARY STENT  INTERVENTION (PCI-S);  Surgeon: [name], MD;  Location: [facility];  Service: Cardiovascular;  Laterality: Right;  . POLYPECTOMY  04/26/2019   Procedure: POLYPECTOMY;  Surgeon: Mansouraty, Telford Nab., MD;  Location: [facility];  Service: Gastroenterology;;  . Lia Foyer TATTOO INJECTION  04/25/2019   Procedure: SUBMUCOSAL TATTOO INJECTION;  Surgeon: Irving Copas., MD;  Location: [facility];  Service: Gastroenterology;;    Social History   Socioeconomic History  . Marital status: Single    Spouse name: Not on file  . Number of children: 0  . Years of education: Not on file  . Highest education level: Not on file  Occupational History  . Occupation: retired    Fish farm manager: [employer]  Tobacco Use  . Smoking status: Never Smoker  . Smokeless tobacco: Never Used  Substance and Sexual Activity  . Alcohol use: No  . Drug use: No  . Sexual activity: Never  Other Topics Concern  . Not on file  Social History Narrative  . Not on file   Social Determinants of Health   Financial Resource Strain: Not on file  Food Insecurity: Not on file  Transportation Needs: Not on file  Physical Activity: Not on file  Stress: Not on file  Social Connections: Not on file  Intimate Partner Violence: Not on file    Current Outpatient Medications on File Prior to Visit  Medication Sig Dispense Refill  . aspirin 81 MG tablet Take 1 tablet (81 mg total) by mouth daily. 30 tablet   . benzonatate (TESSALON PERLES) 100 MG capsule Take 1 capsule (100 mg total) by mouth 3 (three) times daily as needed. 20 capsule 0  . carvedilol (COREG) 3.125 MG tablet Take 1 tablet (3.125 mg total) by mouth 2 (two) times daily with a meal. 180 tablet 3  . Cholecalciferol (VITAMIN D3) 50 MCG (2000 UT) TABS Take 1 tablet by mouth daily.    Marland Kitchen escitalopram (LEXAPRO) 5 MG tablet Take 1 tablet (5 mg total) by mouth daily. 30 tablet 0  . glucose blood (ONE TOUCH ULTRA TEST) test strip USE 1 STRIP TO CHECK  GLUCOSE 4 TIMES DAILY; K86.89, E10.8 150 each 11  . insulin glargine (LANTUS SOLOSTAR) 100 UNIT/ML Solostar Pen Inject 68 Units into the skin every morning. And pen needles 1/day 70 mL 3  . insulin lispro (HUMALOG) 100 UNIT/ML injection Inject 10 units into the skin 3 times daily as needed for high blood sugar (over 300) E08.9 10 mL 3  . Insulin Pen Needle (B-D UF III MINI PEN NEEDLES) 31G X 5 MM MISC USE TO INJECT INSULIN DAILY 100 each 0  . lipase/protease/amylase (CREON) 36000 UNITS CPEP capsule Take 2 capsules (72,000 Units total) by mouth 3 (three) times daily with meals. May also take 1 capsule (36,000 Units total) as needed (with snacks). 240 capsule 11  . lisinopril (ZESTRIL) 10 MG tablet TAKE 1 TABLET(10 MG) BY MOUTH DAILY 90 tablet 1  . Multiple Vitamins-Minerals (CENTRUM SILVER ULTRA MENS PO) Take 1 tablet by mouth daily.     . pantoprazole (PROTONIX) 40 MG tablet Take 1 tablet (40 mg total) by mouth daily. 90 tablet 0  . rosuvastatin (  CRESTOR) 40 MG tablet TAKE 1 TABLET(40 MG) BY MOUTH AT BEDTIME 30 tablet 6   No current facility-administered medications on file prior to visit.    Allergies  Allergen Reactions  . Lipitor [Atorvastatin] Other (See Comments)    Muscle weakness  . Metformin And Related Diarrhea    Severe diarrhea    Family History  Problem Relation Age of Onset  . Diabetes Mother   . Diabetes Paternal Grandmother   . Diabetes Maternal Grandfather   . Colon cancer Neg Hx     BP (!) 160/80 (BP Location: Right Arm, Patient Position: Sitting, Cuff Size: Normal)   Pulse 91   Ht 6' (1.829 m)   Wt 204 lb 6.4 oz (92.7 kg)   SpO2 90%   BMI 27.72 kg/m    Review of Systems     Objective:   Physical Exam VITAL SIGNS:  See vs page GENERAL: no distress Pulses: dorsalis pedis absent bilat.   MSK: left transmetatarsal amputation.   CV:1+ bilat leg edema.   Skin:  no ulcer on the feet.  normal color and temp on the feet Neuro: sensation is intact to touch on  the feet, but decreased from normal.   Ext: there is severeonychomycosis of the right foot toenails.     Lab Results  Component Value Date   HGBA1C 8.2 (A) 11/20/2020       Assessment & Plan:  DM, due to panc insuff, with PAD: uncontrolled.  Hypoglycemia, due to insulin: He is very hesitant to reduce Novolog. We discussed.  He agrees.  HTN: is noted today  Patient Instructions  Your blood pressure is high today.  Please see your primary care provider soon, to have it rechecked Please continue the same Lantus. Please try to avoid the Novolog, as the blood sugar comes down overnight on its own.  To prevent the blood sugar from going low in the morning, you should eat a snack at bedtime.   check your blood sugar twice a day.  vary the time of day when you check, between before the 3 meals, and at bedtime.  also check if you have symptoms of your blood sugar being too high or too low.  please keep a record of the readings and bring it to your next appointment here (or you can bring the meter itself).  You can write it on any piece of paper.  please call us sooner if your blood sugar goes below 70, or if you have a lot of readings over 200. Please come back for a follow-up appointment in 3 months.

## 2020-11-20 NOTE — Patient Instructions (Addendum)
Your blood pressure is high today.  Please see your primary care provider soon, to have it rechecked Please continue the same Lantus. Please try to avoid the Novolog, as the blood sugar comes down overnight on its own.  To prevent the blood sugar from going low in the morning, you should eat a snack at bedtime.   check your blood sugar twice a day.  vary the time of day when you check, between before the 3 meals, and at bedtime.  also check if you have symptoms of your blood sugar being too high or too low.  please keep a record of the readings and bring it to your next appointment here (or you can bring the meter itself).  You can write it on any piece of paper.  please call us sooner if your blood sugar goes below 70, or if you have a lot of readings over 200. Please come back for a follow-up appointment in 3 months.

## 2020-11-24 ENCOUNTER — Other Ambulatory Visit: Payer: Self-pay | Admitting: Family Medicine

## 2020-11-24 DIAGNOSIS — E7849 Other hyperlipidemia: Secondary | ICD-10-CM

## 2020-11-24 DIAGNOSIS — K8689 Other specified diseases of pancreas: Secondary | ICD-10-CM

## 2020-11-24 DIAGNOSIS — E089 Diabetes mellitus due to underlying condition without complications: Secondary | ICD-10-CM

## 2020-11-25 ENCOUNTER — Telehealth: Payer: Self-pay | Admitting: Family Medicine

## 2020-11-25 NOTE — Progress Notes (Signed)
  Chronic Care Management   Outreach Note  11/25/2020 Name: David Lane MRN: [id number] DOB: December 27, 1945  Referred by: Haydee Salter, MD Reason for referral : No chief complaint on file.   An unsuccessful telephone outreach was attempted today. The patient was referred to the pharmacist for assistance with care management and care coordination.   Follow Up Plan:   Carley Perdue [employer] Scheduler

## 2020-11-28 ENCOUNTER — Telehealth: Payer: Self-pay | Admitting: Family Medicine

## 2020-11-28 NOTE — Progress Notes (Signed)
°  Chronic Care Management   Outreach Note  11/28/2020 Name: STALEY LUNZ MRN: [id number] DOB: 1946/01/15  Referred by: Haydee Salter, MD Reason for referral : No chief complaint on file.   A second unsuccessful telephone outreach was attempted today. The patient was referred to pharmacist for assistance with care management and care coordination.  Follow Up Plan:   Carley Perdue [employer] Scheduler

## 2020-12-02 ENCOUNTER — Telehealth: Payer: Self-pay | Admitting: Family Medicine

## 2020-12-02 DIAGNOSIS — F419 Anxiety disorder, unspecified: Secondary | ICD-10-CM

## 2020-12-02 DIAGNOSIS — E7849 Other hyperlipidemia: Secondary | ICD-10-CM

## 2020-12-02 MED ORDER — ROSUVASTATIN CALCIUM 40 MG PO TABS: ORAL_TABLET | ORAL | 6 refills | Status: DC

## 2020-12-02 MED ORDER — ESCITALOPRAM OXALATE 5 MG PO TABS: 5.0000 mg | ORAL_TABLET | Freq: Every day | ORAL | 3 refills | Status: DC

## 2020-12-02 NOTE — Telephone Encounter (Signed)
Refill request for   Escitalopram 5 mg LR 10/27/20, # 30, 0 rf  Rosuvastatin 40 mg  LR 08/08/20.  #30, 6 rf (Dr Sallyanne Kuster) [place] 10/27/20 FOV  01/26/21  Please review and advise.  Thanks.   Dm/cma

## 2020-12-02 NOTE — Telephone Encounter (Signed)
Pt called and said that he needs a refill on escitalopram (LEXAPRO) 5 MG tablet, and rosuvastatin (CRESTOR) 40 MG tablet he said he couldn't get it filled with them over the phone. He needs it sent to [facility], [place], [place] [address]

## 2020-12-02 NOTE — Telephone Encounter (Signed)
Patient notified VIA phone. Dm/cma  

## 2020-12-02 NOTE — Progress Notes (Signed)
  Chronic Care Management   Outreach Note  12/02/2020 Name: David Lane MRN: [id number] DOB: 06/30/1946  Referred by: Haydee Salter, MD Reason for referral : No chief complaint on file.   Third unsuccessful telephone outreach was attempted today. The patient was referred to the pharmacist for assistance with care management and care coordination.   Follow Up Plan:   Carley Perdue [employer] Scheduler

## 2021-01-02 ENCOUNTER — Other Ambulatory Visit: Payer: Self-pay | Admitting: Endocrinology

## 2021-01-02 DIAGNOSIS — E089 Diabetes mellitus due to underlying condition without complications: Secondary | ICD-10-CM

## 2021-01-02 DIAGNOSIS — K8689 Other specified diseases of pancreas: Secondary | ICD-10-CM

## 2021-01-02 NOTE — Telephone Encounter (Signed)
New message    glucose blood (ONE TOUCH ULTRA TEST) test strip  [place], [place] - [address]

## 2021-01-26 ENCOUNTER — Ambulatory Visit: Payer: Medicare Other | Admitting: Family Medicine

## 2021-02-03 ENCOUNTER — Other Ambulatory Visit: Payer: Self-pay

## 2021-02-03 ENCOUNTER — Telehealth: Payer: Self-pay | Admitting: Family Medicine

## 2021-02-03 DIAGNOSIS — K2100 Gastro-esophageal reflux disease with esophagitis, without bleeding: Secondary | ICD-10-CM

## 2021-02-03 MED ORDER — PANTOPRAZOLE SODIUM 40 MG PO TBEC: 40.0000 mg | DELAYED_RELEASE_TABLET | Freq: Every day | ORAL | 0 refills | Status: DC

## 2021-02-03 NOTE — Telephone Encounter (Signed)
Pt has CPE on 02/11/21 but he needs a refill on medications because he is out 1. pantoprazole (PROTONIX) 40 MG tablet (Expired) 2. lipase/protease/amylase (CREON) 36000 UNITS CPEP capsule  [facility] [address]  [phone]

## 2021-02-03 NOTE — Telephone Encounter (Signed)
Patient notified VIA phone that R was sent to the pharmacy. Dm/cma

## 2021-02-05 ENCOUNTER — Ambulatory Visit: Payer: Medicare Other | Admitting: Family Medicine

## 2021-02-11 ENCOUNTER — Telehealth: Payer: Self-pay | Admitting: Family Medicine

## 2021-02-11 ENCOUNTER — Encounter: Payer: Medicare Other | Admitting: Family Medicine

## 2021-02-11 NOTE — Telephone Encounter (Signed)
Spoke to pharmacy, patient is in the coverage gap with insurance and the RX for Creon will cost him $600. He would like to know if there is something comparable to this medication that is cheaper? Please review and advise.  Thanks.  Dm/cma

## 2021-02-11 NOTE — Telephone Encounter (Signed)
What is the name of the medication? lipase/protease/amylase (David Lane) 36000 UNITS CPEP capsule [[id number]]    Have you contacted your pharmacy to request a refill? He is wanting a refill; however, he would like another script comparable to this. His David Lane costs $600 a bottle.    Which pharmacy would you like this sent to? Pharmacy  [facility], [place] - [address]  [address] [place] [address]  Phone:  [phone] Fax:  [phone]       Patient notified that their request is being sent to the clinical staff for review and that they should receive a call once it is complete. If they do not receive a call within 72 hours they can check with their pharmacy or our office.

## 2021-02-12 NOTE — Telephone Encounter (Signed)
lft VM to rtn call @ home number.   Unable to leave VM on cell number. Dm/cma

## 2021-02-13 ENCOUNTER — Telehealth: Payer: Self-pay | Admitting: Family Medicine

## 2021-02-13 NOTE — Telephone Encounter (Signed)
Spoke with patient he request a call back on Monday where he could be near his calendar

## 2021-02-17 NOTE — Telephone Encounter (Signed)
Spoke to patient, advised him of providers recommendation.  He states that he will either call Dr Loanne Drilling or just not take them for a while. Dm/cma

## 2021-03-09 ENCOUNTER — Telehealth: Payer: Self-pay | Admitting: Family Medicine

## 2021-03-09 ENCOUNTER — Telehealth: Payer: Self-pay

## 2021-03-09 NOTE — Progress Notes (Signed)
  Chronic Care Management   Note  03/09/2021 Name: NAHSIR VENEZIA MRN: [id number] DOB: Apr 18, 1946  RAMESES OU is a 75 y.o. year old male who is a primary care patient of Gena Fray, Lillette Boxer, MD. I reached out to Oretha Caprice by phone today in response to a referral sent by Mr. Vaughan Garfinkle [place]'s PCP, Gena Fray, Lillette Boxer, MD.   Mr. Pitter was given information about Chronic Care Management services today including:  CCM service includes personalized support from designated clinical staff supervised by his physician, including individualized plan of care and coordination with other care providers 24/7 contact phone numbers for assistance for urgent and routine care needs. Service will only be billed when office clinical staff spend 20 minutes or more in a month to coordinate care. Only one practitioner may furnish and bill the service in a calendar month. The patient may stop CCM services at any time (effective at the end of the month) by phone call to the office staff.   Patient agreed to services and verbal consent obtained.   Follow up plan:   Tatjana Secretary/administrator

## 2021-03-09 NOTE — Progress Notes (Signed)
    Chronic Care Management Pharmacy Assistant   Name: David Lane  MRN: [id number] DOB: 01-24-1946  Chart review for clinical pharmacist on 03/10/2021.  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  Primary concerns for visit include: None ID   Recent office visits:  10/27/2020 Dr.Rudd MD (PCP) Renew Creon 36000 Units, 2 capsule 3 times daily, may take 1 capsules PRN.Renew Lexapro 5 mg 1 daily.updated his Rx to match his current dosing -  insulin lispro (HUMALOG) 100 UNIT/ML injection; Inject 10 units into the skin 3 times daily as needed for high blood sugar  Recent consult visits:  11/20/2020 Dr.Ellison MD (Endocrinology)  10/16/2020 Colin Benton DO (Family medicine) start benzonatate 100 mg/ PRN,Ambulatory referral for [facility] visits:  None in previous 6 months  Left Voice message to do initial question prior to patient appointment on 03/10/2021 for CCM at with Junius Argyle the Clinical pharmacist.    Medications: Outpatient Encounter Medications as of 03/09/2021  Medication Sig   aspirin 81 MG tablet Take 1 tablet (81 mg total) by mouth daily.   benzonatate (TESSALON PERLES) 100 MG capsule Take 1 capsule (100 mg total) by mouth 3 (three) times daily as needed.   carvedilol (COREG) 3.125 MG tablet Take 1 tablet (3.125 mg total) by mouth 2 (two) times daily with a meal.   Cholecalciferol (VITAMIN D3) 50 MCG (2000 UT) TABS Take 1 tablet by mouth daily.   escitalopram (LEXAPRO) 5 MG tablet Take 1 tablet (5 mg total) by mouth daily.   insulin glargine (LANTUS SOLOSTAR) 100 UNIT/ML Solostar Pen Inject 68 Units into the skin every morning. And pen needles 1/day   insulin lispro (HUMALOG) 100 UNIT/ML injection Inject 10 units into the skin 3 times daily as needed for high blood sugar (over 300) E08.9   Insulin Pen Needle (B-D UF III  MINI PEN NEEDLES) 31G X 5 MM MISC USE TO INJECT INSULIN DAILY   lipase/protease/amylase (CREON) 36000 UNITS CPEP capsule Take 2 capsules (72,000 Units total) by mouth 3 (three) times daily with meals. May also take 1 capsule (36,000 Units total) as needed (with snacks).   lisinopril (ZESTRIL) 10 MG tablet TAKE 1 TABLET(10 MG) BY MOUTH DAILY   Multiple Vitamins-Minerals (CENTRUM SILVER ULTRA MENS PO) Take 1 tablet by mouth daily.    ONETOUCH ULTRA test strip USE 1 STRIP TO CHECK GLUCOSE 4 TIMES DAILY   pantoprazole (PROTONIX) 40 MG tablet Take 1 tablet (40 mg total) by mouth daily.   rosuvastatin (CRESTOR) 40 MG tablet TAKE 1 TABLET(40 MG) BY MOUTH AT BEDTIME   No facility-administered encounter medications on file as of 03/09/2021.    Star Rating Drugs: Rosuvastatin 40 mg last filled on 12/02/2020 for 90 day supply at [facility]. Lisinopril 10 mg last filled on 10/07/2020 for 90 day supply at [facility].  [place] Pharmacist Assistant [phone]

## 2021-03-10 ENCOUNTER — Ambulatory Visit (INDEPENDENT_AMBULATORY_CARE_PROVIDER_SITE_OTHER): Payer: Medicare Other

## 2021-03-10 DIAGNOSIS — K2100 Gastro-esophageal reflux disease with esophagitis, without bleeding: Secondary | ICD-10-CM

## 2021-03-10 DIAGNOSIS — E785 Hyperlipidemia, unspecified: Secondary | ICD-10-CM

## 2021-03-10 DIAGNOSIS — I5032 Chronic diastolic (congestive) heart failure: Secondary | ICD-10-CM

## 2021-03-10 DIAGNOSIS — I152 Hypertension secondary to endocrine disorders: Secondary | ICD-10-CM | POA: Diagnosis not present

## 2021-03-10 DIAGNOSIS — E1159 Type 2 diabetes mellitus with other circulatory complications: Secondary | ICD-10-CM | POA: Diagnosis not present

## 2021-03-10 DIAGNOSIS — F419 Anxiety disorder, unspecified: Secondary | ICD-10-CM

## 2021-03-10 DIAGNOSIS — I10 Essential (primary) hypertension: Secondary | ICD-10-CM | POA: Diagnosis not present

## 2021-03-10 DIAGNOSIS — K8689 Other specified diseases of pancreas: Secondary | ICD-10-CM | POA: Diagnosis not present

## 2021-03-10 DIAGNOSIS — E089 Diabetes mellitus due to underlying condition without complications: Secondary | ICD-10-CM

## 2021-03-10 DIAGNOSIS — E1169 Type 2 diabetes mellitus with other specified complication: Secondary | ICD-10-CM

## 2021-03-10 NOTE — Progress Notes (Signed)
Chronic Care Management Pharmacy Note  03/11/2021 Name:  David Lane MRN:  [id number] DOB:  03-27-1946  Summary: Patient reports he has been doing well overall. He has been less active lately and has been concerned that his blood sugars have been "all over the place." Blood sugars ranging from 60-450. He also reports his blood pressure has been low. He gets anxious when he feels his health conditions are out of control. He has been adherent to Lexapro but has not felt it has given him much benefit. He never refilled Pantoprazole and has reported only mild GERD symptoms in past month. He has not refilled Creon due to the cost.   Recommendations/Changes made from today's visit: -Recommended STOPPING Carvedilol -Recommend STARTING Metoprolol XL 25 mg daily, given more specific beta blockade decreases likelihood of hypotension -Recommend DECREASING lisinopril to 5 mg DAILY for more consistent CKD protective benefit and stable blood pressure control -Recommended INCREASING escitalopram to 10 mg daily  -Recommended STOPPING pantoprazole given relative control without PPI for >1 month   Plan: Will plan to place CGM Sensor in two weeks and see if patient meets criteria for long-term CGM use.  Health concierge to work on patient assistance for Creon  Subjective: David Lane is an 75 y.o. year old male who is a primary patient of Rudd, Lillette Boxer, MD.  The CCM team was consulted for assistance with disease management and care coordination needs.    Engaged with patient by telephone for initial visit in response to provider referral for pharmacy case management and/or care coordination services.   Consent to Services:  The patient was given the following information about Chronic Care Management services today, agreed to services, and gave verbal consent: 1. CCM service includes personalized support from designated clinical staff supervised by the primary care provider, including  individualized plan of care and coordination with other care providers 2. 24/7 contact phone numbers for assistance for urgent and routine care needs. 3. Service will only be billed when office clinical staff spend 20 minutes or more in a month to coordinate care. 4. Only one practitioner may furnish and bill the service in a calendar month. 5.The patient may stop CCM services at any time (effective at the end of the month) by phone call to the office staff. 6. The patient will be responsible for cost sharing (co-pay) of up to 20% of the service fee (after annual deductible is met). Patient agreed to services and consent obtained.  Patient Care Team: Haydee Salter, MD as PCP - General (Family Medicine) [name], MD as PCP - Cardiology (Cardiology) Germaine Pomfret, [facility] as Pharmacist (Pharmacist)  Recent office visits: 10/27/2020 Dr.Rudd MD (PCP) Renew Creon 36000 Units, 2 capsule 3 times daily, may take 1 capsules PRN.Renew Lexapro 5 mg 1 daily.updated his Rx to match his current dosing -  insulin lispro (HUMALOG) 100 UNIT/ML injection; Inject 10 units into the skin 3 times daily as needed for high blood sugar  Recent consult visits: 11/20/2020 Dr.Ellison MD (Endocrinology). A1c 8.2%.  10/16/2020 Colin Benton DO (Family medicine) start benzonatate 100 mg/ PRN,Ambulatory referral for [facility] visits: None in previous 6 months   Objective:  Lab Results  Component Value Date   CREATININE 1.25 (H) 08/21/2019   BUN 24 (H) 08/21/2019   GFR 69.70 05/08/2019   GFRNONAA 58 (L) 08/21/2019   GFRAA >60 08/21/2019   NA 137 08/21/2019   K 4.2 08/21/2019   CALCIUM 8.7 (L) 08/21/2019  CO2 24 08/21/2019   GLUCOSE 54 (L) 08/21/2019    Lab Results  Component Value Date/Time   HGBA1C 8.2 (A) 11/20/2020 02:24 PM   HGBA1C 8.8 (A) 08/14/2020 09:51 AM   HGBA1C 10.0 (H) 12/21/2017 03:45 AM   HGBA1C 10.0 (H) 12/20/2017 03:01 PM   FRUCTOSAMINE 428 (H) 04/21/2016 08:33 AM    GFR 69.70 05/08/2019 03:33 PM   GFR 60.01 04/21/2016 08:33 AM   MICROALBUR 1.5 11/25/2014 01:39 PM   MICROALBUR 8.2 (H) 01/24/2013 04:40 PM    Last diabetic Eye exam: No results found for: HMDIABEYEEXA  Last diabetic Foot exam: No results found for: HMDIABFOOTEX   Lab Results  Component Value Date   CHOL 102 12/21/2017   HDL 24 (L) 12/21/2017   LDLCALC 64 12/21/2017   TRIG 70 12/21/2017   CHOLHDL 4.3 12/21/2017    Hepatic Function Latest Ref Rng & Units 08/21/2019 08/09/2019 05/08/2019  Total Protein 6.5 - 8.1 g/dL 7.5 6.2(L) 6.3  Albumin 3.5 - 5.0 g/dL 3.8 3.2(L) 3.7  AST 15 - 41 U/L 28 41 21  ALT 0 - 44 U/L 47(H) 69(H) 32  Alk Phosphatase 38 - 126 U/L 98 102 89  Total Bilirubin 0.3 - 1.2 mg/dL 0.8 0.6 0.5  Bilirubin, Direct 0.0 - 0.2 mg/dL - - -    Lab Results  Component Value Date/Time   TSH 2.426 08/21/2019 05:14 PM   TSH 1.94 10/06/2018 03:05 PM   TSH 2.31 11/25/2014 01:39 PM    CBC Latest Ref Rng & Units 08/21/2019 08/09/2019 05/08/2019  WBC 4.0 - 10.5 K/uL 10.5 8.2 6.5  Hemoglobin 13.0 - 17.0 g/dL 13.9 12.5(L) 10.9(L)  Hematocrit 39.0 - 52.0 % 41.9 37.6(L) 32.5(L)  Platelets 150 - 400 K/uL 236 227 279.0    Lab Results  Component Value Date/Time   VD25OH 15 (L) 10/06/2018 03:05 PM    Clinical ASCVD: Yes  The ASCVD Risk score Mikey Bussing DC Jr., et al., 2013) failed to calculate for the following reasons:   The patient has a prior MI or stroke diagnosis    Depression screen [facility] 2/9 10/27/2020 12/05/2014  Decreased Interest 0 0  Down, Depressed, Hopeless 0 0  PHQ - 2 Score 0 0  Some recent data might be hidden    Social History   Tobacco Use  Smoking Status Never  Smokeless Tobacco Never   BP Readings from Last 3 Encounters:  11/20/20 (!) 160/80  10/27/20 132/74  08/14/20 (!) 172/84   Pulse Readings from Last 3 Encounters:  11/20/20 91  10/27/20 91  08/14/20 78   Wt Readings from Last 3 Encounters:  11/20/20 204 lb 6.4 oz (92.7 kg)  10/27/20 203 lb  (92.1 kg)  08/14/20 207 lb 6.4 oz (94.1 kg)   BMI Readings from Last 3 Encounters:  11/20/20 27.72 kg/m  10/27/20 27.53 kg/m  08/14/20 28.13 kg/m    Assessment/Interventions: Review of patient past medical history, allergies, medications, health status, including review of consultants reports, laboratory and other test data, was performed as part of comprehensive evaluation and provision of chronic care management services.   SDOH:  (Social Determinants of Health) assessments and interventions performed: Yes SDOH Interventions    Flowsheet Row Most Recent Value  SDOH Interventions   Financial Strain Interventions Other (Comment)  [PAP]      SDOH Screenings   Alcohol Screen: Not on file  Depression (PHQ2-9): Low Risk    PHQ-2 Score: 0  Financial Resource Strain: High Risk   Difficulty of  Paying Living Expenses: Hard  Food Insecurity: Not on file  Housing: Not on file  Physical Activity: Not on file  Social Connections: Not on file  Stress: Not on file  Tobacco Use: Low Risk    Smoking Tobacco Use: Never   Smokeless Tobacco Use: Never  Transportation Needs: Not on file    [place]  Allergies  Allergen Reactions   Lipitor [Atorvastatin] Other (See Comments)    Muscle weakness   Metformin And Related Diarrhea    Severe diarrhea    Medications Reviewed Today     Reviewed by Germaine Pomfret, [facility] (Pharmacist) on 03/10/21 at 1117  Med List Status: <None>   Medication Order Taking? Sig Documenting Provider Last Dose Status Informant  aspirin 81 MG tablet [id number] Yes Take 1 tablet (81 mg total) by mouth daily. Hosie Poisson, MD Taking Active Self  carvedilol (COREG) 3.125 MG tablet [id number] Yes Take 1 tablet (3.125 mg total) by mouth 2 (two) times daily with a meal. Almyra Deforest, PA Taking Active   Cholecalciferol (VITAMIN D3) 50 MCG (2000 UT) TABS [id number] Yes Take 1 tablet by mouth daily. [provider] Taking Active Self  escitalopram (LEXAPRO)  5 MG tablet [id number] Yes Take 1 tablet (5 mg total) by mouth daily. Haydee Salter, MD Taking Active   insulin glargine (LANTUS SOLOSTAR) 100 UNIT/ML Solostar Pen [id number] Yes Inject 68 Units into the skin every morning. And pen needles 1/day Renato Shin, MD Taking Active   insulin lispro (HUMALOG) 100 UNIT/ML injection [id number] Yes Inject 10 units into the skin 3 times daily as needed for high blood sugar (over 300) E08.9 Rudd, Lillette Boxer, MD Taking Active   Insulin Pen Needle (B-D UF III MINI PEN NEEDLES) 31G X 5 MM MISC [id number]  USE TO INJECT INSULIN DAILY Renato Shin, MD  Active   lipase/protease/amylase (CREON) 36000 UNITS CPEP capsule [id number] No Take 2 capsules (72,000 Units total) by mouth 3 (three) times daily with meals. May also take 1 capsule (36,000 Units total) as needed (with snacks).  Patient not taking: Reported on 03/10/2021   Haydee Salter, MD Not Taking Active   lisinopril (ZESTRIL) 10 MG tablet [id number] Yes TAKE 1 TABLET(10 MG) BY MOUTH DAILY  Patient taking differently: Take 10 mg by mouth daily. Only takes PRN if BP is greater than 150   [place], [place], [place] Taking Active   Multiple Vitamins-Minerals (CENTRUM SILVER ULTRA MENS PO) [id number] Yes Take 1 tablet by mouth daily.  [provider] Taking Active Self  Donald Siva test strip [id number]  USE 1 STRIP TO CHECK GLUCOSE 4 TIMES DAILY Renato Shin, MD  Active   pantoprazole (PROTONIX) 40 MG tablet [id number] No Take 1 tablet (40 mg total) by mouth daily.  Patient not taking: Reported on 03/10/2021   Haydee Salter, MD Not Taking Active   rosuvastatin (CRESTOR) 40 MG tablet [id number] Yes TAKE 1 TABLET(40 MG) BY MOUTH AT BEDTIME Haydee Salter, MD Taking Active             Patient Active Problem List   Diagnosis Date Noted   Coronary artery disease involving native coronary artery of native heart without angina pectoris 06/21/2019   AVM (arteriovenous malformation) of colon    History of colonic  polyps    Duodenitis    Gastroesophageal reflux disease with esophagitis    Acute upper GI bleed 04/23/2019   Dyslipidemia (high LDL; low HDL) 07/01/2018   Labile blood glucose  Functional gait disorder 01/20/2018   Acute blood loss anemia 01/20/2018   Status post transmetatarsal amputation of left foot ([place]) 01/20/2018   Foot ulcer due to secondary DM ([place]) 01/04/2018   Status post coronary artery stent placement    Acute ST elevation myocardial infarction (STEMI) involving left anterior descending (LAD) coronary artery ([place]) 12/20/2017   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 ([place]) 12/14/2015   Wellness examination 11/25/2014   Screening for prostate cancer 11/25/2014   Pancreatic insufficiency 01/25/2013   Ischemic cardiomyopathy, EF 50% by echo 08/01/13 07/19/2012   Chronic combined systolic and diastolic CHF (congestive heart failure) ([place]) 07/14/2012   Pulmonary edema, most likely due to diastolic dysfunction 82/42/3536   CAD, 07/14/12- LAD/PDA DES    Anxiety 12/12/2009   Hyperlipidemia 04/24/2007   Essential hypertension 04/24/2007   PANCREATITIS, HX OF 04/24/2007    Immunization History  Administered Date(s) Administered   Pneumococcal Conjugate-13 12/05/2014   Td 12/04/2013    Conditions to be addressed/monitored:  Hypertension, Hyperlipidemia, Diabetes, Heart Failure, Coronary Artery Disease, GERD, and Anxiety  Care Plan : General Pharmacy (Adult)  Updates made by Germaine Pomfret, RPH since 03/11/2021 12:00 AM     Problem: Hypertension, Hyperlipidemia, Diabetes, Heart Failure, Coronary Artery Disease, GERD, and Anxiety   Priority: High     Long-Range Goal: Patient-Specific Goal   Start Date: 03/10/2021  Expected End Date: 09/09/2021  This Visit's Progress: On track  Priority: High  Note:   Current Barriers:  Unable to independently afford treatment regimen Unable to achieve control of Diabetes  Suboptimal therapeutic regimen for  hypertension/heart failure Unable to achieve control of Anxiety   Pharmacist Clinical Goal(s):  Patient will verbalize ability to afford treatment regimen achieve control of Diabetes as evidenced by A1c less than 8% adhere to plan to optimize therapeutic regimen for hypertension as evidenced by report of adherence to recommended medication management changes Achieve control of anxiety via patient report through collaboration with PharmD and provider.   Interventions: 1:1 collaboration with Haydee Salter, MD regarding development and update of comprehensive plan of care as evidenced by provider attestation and co-signature Inter-disciplinary care team collaboration (see longitudinal plan of care) Comprehensive medication review performed; medication list updated in electronic medical record  Diabetes (A1c goal <8%) -Uncontrolled -Diagnosed 1988; history of pancreatic insufficiency  -History of Pancreatitis  -Current medications: Lantus 68 units daily (0.73 u/kg)  Humalog 10 units three times daily as needed for BG > 300: Uses a couple of times a week -Medications previously tried: Metformin, Levemir, Novolin N,   -Current home glucose readings fasting glucose: 222, 91, 91, 202, 473, 68   post prandial glucose: 209  -Reports hypoglycemic symptoms: Vision changes, weakness -Current meal patterns: sporadic meal patterns, does not eat three consistent meals daily   -Current exercise: Exercise limited by toe amputation, Covid. Strength is lower. Trying to build up strength by walking up and down driveway  -Large Basal insulin dose and variable diet are likely large contributors to patients hypoglycemia and hyperglycemia variability. Patient would likely benefit from a reduction in basal insulin and more consistent meal-time insulin use.  -Recommended to continue current medication -Will place CGM sensor in 2 weeks to better assess current blood sugar trends and see if patient qualifies for  CGM monitoring.   Heart Failure (Goal: manage symptoms and prevent exacerbations) -Not ideally controlled -managed by Dr. Sallyanne Kuster (last visit was June 2021)  -Last ejection fraction: 55-60% (Date: 02/12/18) -HF type: Diastolic -NYHA Class: I (no  actitivty limitation) -AHA HF Stage: B (Heart disease present - no symptoms present) -Current treatment: Carvedilol 3.125 mg twice daily  Lisinopril 10 mg daily  -Medications previously tried: Furosemide, Imdur, Nebivolol, Quinapril, HCTZ, Spironolactone, Amlodipine -Current home BP/HR readings: 90/64 pulse 96, 117/81 pulse 92 -Educated on Importance of blood pressure control -Patient with frequent hypotension, sporadic ACEi use. Given patient's borderline CKD, patient would likely benefit from consistent daily lisinopril use.  -Recommended STOPPING Carvedilol -Recommend STARTING Metoprolol XL 25 mg daily, given more selective beta blockade decreases likelihood of hypotension -Recommend DECREASING lisinopril to 5 mg DAILY for more consistent CKD protective benefit and stable blood pressure control  Hyperlipidemia: (LDL goal < 70) -Controlled -Current treatment: Rosuvastatin 40 mg nightly  -Medications previously tried: Simvastatin, Atorvastatin (Myalgia)   -Educated on Importance of limiting foods high in cholesterol; -Recommended to continue current medication  Anxiety (Goal: maintain stable mood) -Uncontrolled -Current treatment: Escitalopram 5 mg daily  -Medications previously tried/failed: NA -Hasn't felt much benefit from escitalopram. Mainly feels stressed regarding his health.  -PHQ9: 0 -GAD7: NA -Connected with None for mental health support -Recommended INCREASING escitalopram to 10 mg daily   GERD (Goal: Prevent Heartburn/Reflux) -Controlled -History of esophagitis, duodenitis -Current treatment  None -Medications previously tried: Pantoprazole -Never got refilled in May. Has mild-moderate symptoms largely driven by food  triggers (Spaghetti, spicy foods, large meals)   -Recommended STOPPING pantoprazole given relative control without PPI for >1 month    Patient Goals/Self-Care Activities Patient will:  - check glucose 2-3 times daily, document, and provide at future appointments check blood pressure 3 times weekly, document, and provide at future appointments  Follow Up Plan: Face to Face appointment with care management team member scheduled for: 03/23/2021 at 3:00 PM      Medication Assistance: Application for Creon  medication assistance program. in process.  Anticipated assistance start date TBD.  See plan of care for additional detail.  Compliance/Adherence/Medication fill history: Care Gaps: Covid-19 Hepatitis C Shingrix PPSV23   Star-Rating Drugs: Rosuvastatin 40 mg last filled on 12/02/2020 for 90 day supply at [facility]. Lisinopril 10 mg last filled on 10/07/2020 for 90 day supply at [facility].  Patient's preferred pharmacy is:  [place], [place] - [address] [place] [address] Phone: [phone] Fax: [place], [address] [place] [address] Phone: [phone] Fax: [phone]  Uses pill box? Yes Pt endorses 100% compliance  We discussed: Current pharmacy is preferred with insurance plan and patient is satisfied with pharmacy services Patient decided to: Continue current medication management strategy  Care Plan and Follow Up Patient Decision:  Patient agrees to Care Plan and Follow-up.  Plan: Face to Face appointment with care management team member scheduled for: 03/23/2021 at 3:00 PM  Junius Argyle, PharmD, CPP Clinical Pharmacist [facility]  [phone]

## 2021-03-11 ENCOUNTER — Other Ambulatory Visit: Payer: Self-pay | Admitting: Endocrinology

## 2021-03-11 DIAGNOSIS — E089 Diabetes mellitus due to underlying condition without complications: Secondary | ICD-10-CM

## 2021-03-11 DIAGNOSIS — K8689 Other specified diseases of pancreas: Secondary | ICD-10-CM

## 2021-03-11 NOTE — Patient Instructions (Signed)
Visit Information It was great speaking with you today!  Please let me know if you have any questions about our visit.   Goals Addressed             This Visit's Progress    Monitor and Manage My Blood Sugar-Diabetes Type 2       Timeframe:  Long-Range Goal Priority:  High Start Date: 03/10/2021                            Expected End Date: 09/09/2021                      Follow Up Date 04/21/2021    - check blood sugar before meals and at bedtime - check blood sugar before and after exercise - check blood sugar if I feel it is too high or too low - take the blood sugar log to all doctor visits   Why is this important?   Checking your blood sugar at home helps to keep it from getting very high or very low.  Writing the results in a diary or log helps the doctor know how to care for you.  Your blood sugar log should have the time, date and the results.  Also, write down the amount of insulin or other medicine that you take.  Other information, like what you ate, exercise done and how you were feeling, will also be helpful.     Notes:       Track and Manage Fluids and Swelling-Heart Failure       Timeframe:  Long-Range Goal Priority:  High Start Date: 03/10/2021                            Expected End Date: 09/09/2021                      Follow Up Date 03/23/2021    - call office if I gain more than 2 pounds in one day or 5 pounds in one week - keep legs up while sitting - use salt in moderation - watch for swelling in feet, ankles and legs every day    Why is this important?   It is important to check your weight daily and watch how much salt and liquids you have.  It will help you to manage your heart failure.    Notes:          Patient Care Plan: General Pharmacy (Adult)     Problem Identified: Hypertension, Hyperlipidemia, Diabetes, Heart Failure, Coronary Artery Disease, GERD, and Anxiety   Priority: High     Long-Range Goal: Patient-Specific Goal    Start Date: 03/10/2021  Expected End Date: 09/09/2021  This Visit's Progress: On track  Priority: High  Note:   Current Barriers:  Unable to independently afford treatment regimen Unable to achieve control of Diabetes  Suboptimal therapeutic regimen for hypertension/heart failure Unable to achieve control of Anxiety   Pharmacist Clinical Goal(s):  Patient will verbalize ability to afford treatment regimen achieve control of Diabetes as evidenced by A1c less than 8% adhere to plan to optimize therapeutic regimen for hypertension as evidenced by report of adherence to recommended medication management changes Achieve control of anxiety via patient report through collaboration with PharmD and provider.   Interventions: 1:1 collaboration with Haydee Salter, MD regarding development and update of comprehensive  plan of care as evidenced by provider attestation and co-signature Inter-disciplinary care team collaboration (see longitudinal plan of care) Comprehensive medication review performed; medication list updated in electronic medical record  Diabetes (A1c goal <8%) -Uncontrolled -Diagnosed 1988; history of pancreatic insufficiency  -History of Pancreatitis  -Current medications: Lantus 68 units daily (0.73 u/kg)  Humalog 10 units three times daily as needed for BG > 300: Uses a couple of times a week -Medications previously tried: Metformin, Levemir, Novolin N,   -Current home glucose readings fasting glucose: 222, 91, 91, 202, 473, 68   post prandial glucose: 209  -Reports hypoglycemic symptoms: Vision changes, weakness -Current meal patterns: sporadic meal patterns, does not eat three consistent meals daily   -Current exercise: Exercise limited by toe amputation, Covid. Strength is lower. Trying to build up strength by walking up and down driveway  -Large Basal insulin dose and variable diet are likely large contributors to patients hypoglycemia and hyperglycemia variability.  Patient would likely benefit from a reduction in basal insulin and more consistent meal-time insulin use.  -Recommended to continue current medication -Will place CGM sensor in 2 weeks to better assess current blood sugar trends and see if patient qualifies for CGM monitoring.   Heart Failure (Goal: manage symptoms and prevent exacerbations) -Not ideally controlled -managed by Dr. Sallyanne Kuster (last visit was June 2021)  -Last ejection fraction: 55-60% (Date: 02/12/18) -HF type: Diastolic -NYHA Class: I (no actitivty limitation) -AHA HF Stage: B (Heart disease present - no symptoms present) -Current treatment: Carvedilol 3.125 mg twice daily  Lisinopril 10 mg daily  -Medications previously tried: Furosemide, Imdur, Nebivolol, Quinapril, HCTZ, Spironolactone, Amlodipine -Current home BP/HR readings: 90/64 pulse 96, 117/81 pulse 92 -Educated on Importance of blood pressure control -Patient with frequent hypotension, sporadic ACEi use. Given patient's borderline CKD, patient would likely benefit from consistent daily lisinopril use.  -Recommended STOPPING Carvedilol -Recommend STARTING Metoprolol XL 25 mg daily, given more selective beta blockade decreases likelihood of hypotension -Recommend DECREASING lisinopril to 5 mg DAILY for more consistent CKD protective benefit and stable blood pressure control  Hyperlipidemia: (LDL goal < 70) -Controlled -Current treatment: Rosuvastatin 40 mg nightly  -Medications previously tried: Simvastatin, Atorvastatin (Myalgia)   -Educated on Importance of limiting foods high in cholesterol; -Recommended to continue current medication  Anxiety (Goal: maintain stable mood) -Uncontrolled -Current treatment: Escitalopram 5 mg daily  -Medications previously tried/failed: NA -Hasn't felt much benefit from escitalopram. Mainly feels stressed regarding his health.  -PHQ9: 0 -GAD7: NA -Connected with None for mental health support -Recommended INCREASING  escitalopram to 10 mg daily   GERD (Goal: Prevent Heartburn/Reflux) -Controlled -History of esophagitis, duodenitis -Current treatment  None -Medications previously tried: Pantoprazole -Never got refilled in May. Has mild-moderate symptoms largely driven by food triggers (Spaghetti, spicy foods, large meals)   -Recommended STOPPING pantoprazole given relative control without PPI for >1 month    Patient Goals/Self-Care Activities Patient will:  - check glucose 2-3 times daily, document, and provide at future appointments check blood pressure 3 times weekly, document, and provide at future appointments  Follow Up Plan: Face to Face appointment with care management team member scheduled for: 03/23/2021 at 3:00 PM    David Lane was given information about Chronic Care Management services today including:  CCM service includes personalized support from designated clinical staff supervised by his physician, including individualized plan of care and coordination with other care providers 24/7 contact phone numbers for assistance for urgent and routine care needs. Standard insurance, coinsurance, copays  and deductibles apply for chronic care management only during months in which we provide at least 20 minutes of these services. Most insurances cover these services at 100%, however patients may be responsible for any copay, coinsurance and/or deductible if applicable. This service may help you avoid the need for more expensive face-to-face services. Only one practitioner may furnish and bill the service in a calendar month. The patient may stop CCM services at any time (effective at the end of the month) by phone call to the office staff.  Patient agreed to services and verbal consent obtained.   The patient verbalized understanding of instructions, educational materials, and care plan provided today and declined offer to receive copy of patient instructions, educational materials, and care plan.    David Lane, PharmD, CPP Clinical Pharmacist [facility] at [facility]  [phone]

## 2021-03-13 ENCOUNTER — Telehealth: Payer: Self-pay

## 2021-03-13 NOTE — Chronic Care Management (AMB) (Signed)
A user error has taken place: charting done on wrong patient and has been corrected Wrong Department opened.  Pattricia Boss, [place] Pharmacist Assistant [phone]

## 2021-03-13 NOTE — Chronic Care Management (AMB) (Signed)
    Chronic Care Management Pharmacy Assistant   Name: David Lane  MRN: [id number] DOB: 1946-07-20  Reason for Encounter:  Patient Assistant Coordination  7/82/4235- Application for Patient Care Assistance started for Creon 36,000 Units. Form will be mailed to the patients address on file for completion. Patient will be instructed to complete the form, and return it to Junius Argyle, CPP so the form can be sent to [facility] Assist for completion. [facility] Phone [phone] Fax [phone]  Did  a cost comparison for the patients medications with his current insurance BCBS Medicare and his current pharmacy [facility] compared to [facility]  and Anders Simmonds is the preferred pharmacy The total yearly cost of patients medications for [facility] is [phone] yearly and for [facility] it is $3004.18 Yearly which is a $129.26 difference yearly. Called patient to update information, no answer, left message to return call.    Medications: Outpatient Encounter Medications as of 03/13/2021  Medication Sig   aspirin 81 MG tablet Take 1 tablet (81 mg total) by mouth daily.   carvedilol (COREG) 3.125 MG tablet Take 1 tablet (3.125 mg total) by mouth 2 (two) times daily with a meal.   Cholecalciferol (VITAMIN D3) 50 MCG (2000 UT) TABS Take 1 tablet by mouth daily.   escitalopram (LEXAPRO) 5 MG tablet Take 1 tablet (5 mg total) by mouth daily.   insulin glargine (LANTUS SOLOSTAR) 100 UNIT/ML Solostar Pen Inject 68 Units into the skin every morning. And pen needles 1/day   insulin lispro (HUMALOG) 100 UNIT/ML injection Inject 10 units into the skin 3 times daily as needed for high blood sugar (over 300) E08.9   Insulin Pen Needle (B-D UF III MINI PEN NEEDLES) 31G X 5 MM MISC USE TO INJECT INSULIN DAILY   lipase/protease/amylase (CREON) 36000 UNITS CPEP capsule Take 2 capsules (72,000 Units total) by mouth 3 (three) times daily with meals. May also take 1 capsule (36,000 Units  total) as needed (with snacks). (Patient not taking: Reported on 03/10/2021)   lisinopril (ZESTRIL) 10 MG tablet TAKE 1 TABLET(10 MG) BY MOUTH DAILY (Patient taking differently: Take 10 mg by mouth daily. Only takes PRN if BP is greater than 150)   Multiple Vitamins-Minerals (CENTRUM SILVER ULTRA MENS PO) Take 1 tablet by mouth daily.    ONETOUCH ULTRA test strip USE 1 STRIP TO CHECK GLUCOSE 4 TIMES DAILY   pantoprazole (PROTONIX) 40 MG tablet Take 1 tablet (40 mg total) by mouth daily. (Patient not taking: Reported on 03/10/2021)   rosuvastatin (CRESTOR) 40 MG tablet TAKE 1 TABLET(40 MG) BY MOUTH AT BEDTIME   No facility-administered encounter medications on file as of 03/13/2021.    Star Rating Drugs: Rosuvastatin 40 mg last filled on 12/02/2020 for 90 day supply at [facility]. Lisinopril 10 mg last filled on 10/07/2020 for 90 day supply at [facility], [place] Pharmacist Assistant [phone]

## 2021-03-17 ENCOUNTER — Encounter: Payer: Self-pay | Admitting: Family Medicine

## 2021-03-17 ENCOUNTER — Ambulatory Visit (INDEPENDENT_AMBULATORY_CARE_PROVIDER_SITE_OTHER): Payer: Medicare Other | Admitting: Family Medicine

## 2021-03-17 ENCOUNTER — Other Ambulatory Visit: Payer: Self-pay

## 2021-03-17 VITALS — BP 126/70 | HR 85 | Temp 97.8°F | Ht 72.0 in | Wt 211.8 lb

## 2021-03-17 DIAGNOSIS — E7849 Other hyperlipidemia: Secondary | ICD-10-CM | POA: Diagnosis not present

## 2021-03-17 DIAGNOSIS — I5042 Chronic combined systolic (congestive) and diastolic (congestive) heart failure: Secondary | ICD-10-CM | POA: Diagnosis not present

## 2021-03-17 DIAGNOSIS — I10 Essential (primary) hypertension: Secondary | ICD-10-CM | POA: Diagnosis not present

## 2021-03-17 DIAGNOSIS — B351 Tinea unguium: Secondary | ICD-10-CM

## 2021-03-17 DIAGNOSIS — E089 Diabetes mellitus due to underlying condition without complications: Secondary | ICD-10-CM

## 2021-03-17 DIAGNOSIS — K8689 Other specified diseases of pancreas: Secondary | ICD-10-CM

## 2021-03-17 DIAGNOSIS — R238 Other skin changes: Secondary | ICD-10-CM

## 2021-03-17 DIAGNOSIS — F419 Anxiety disorder, unspecified: Secondary | ICD-10-CM

## 2021-03-17 MED ORDER — METOPROLOL SUCCINATE ER 25 MG PO TB24: 25.0000 mg | ORAL_TABLET | Freq: Every day | ORAL | 3 refills | Status: DC

## 2021-03-17 MED ORDER — LISINOPRIL 5 MG PO TABS: 5.0000 mg | ORAL_TABLET | Freq: Every day | ORAL | 3 refills | Status: DC

## 2021-03-17 MED ORDER — INSULIN ASPART 100 UNIT/ML IJ SOLN: INTRAMUSCULAR | 99 refills | Status: DC

## 2021-03-17 NOTE — Progress Notes (Signed)
[facility] [address] [place] [address] Dept: [phone] Dept Fax: [phone]  Chronic Care Office Visit  Subjective:    Patient ID: David Lane, male    DOB: [birth year], 75 y.o..   MRN: [id number]  Chief Complaint  Patient presents with   Coronary Artery Disease   Diabetes   Hypertension   History of Present Illness:  Patient is in today for reassessment of chronic medical issues.  Mr. Pro has a history of coronary artery disease, ischemic cardiomyopathy, and heart failure with preserved EF (EF= 55-60%, 01/2020). He is managed currently with carvedilol, aspirin, rosuvastatin, and uses lisinopril if his systolic BP is > 814. He notes that he has recurrent episodes of either hypertension or hypotension. It can be stable for several days at a time and then suddenly fluctuate.   Mr. Riesgo has a history of pancreatic insufficiency related to a prior episode of gallstone pancreatitis. He is currently prescribed Creon, but he has concerns with the cost of the medication. As such, he does not always consistently use the enzymes as prescribed.  Mr. Strey has a history of diabetes secondary to pancreatic insufficiency. This has primarily been managed by Dr. Loanne Drilling. He has been noted to have labile diabetes. Recently he has noticed increases in blood sugar up to 331 and down to around 60. He notes that he tends to get sweaty when his sugars are low. Since his last visit, he was switched to Insulin aspart (Novolog) from his insulin lispor (Humalog). He notes he uses 10 units whenever his blood sugars are elevated.  Mr. Maura notes that he currently has a sore of some sort on his right buttocks. He is worried that maybe he sits too much and caused a pressure sore.  Past Medical History: Patient Active Problem List   Diagnosis Date Noted   Coronary artery disease involving native coronary artery of native heart without  angina pectoris 06/21/2019   AVM (arteriovenous malformation) of colon    History of colonic polyps    Duodenitis    Gastroesophageal reflux disease with esophagitis    Acute upper GI bleed 04/23/2019   Labile blood glucose    Functional gait disorder 01/20/2018   Acute blood loss anemia 01/20/2018   Status post transmetatarsal amputation of left foot ([place]) 01/20/2018   Status post coronary artery stent placement    Acute ST elevation myocardial infarction (STEMI) involving left anterior descending (LAD) coronary artery ([place]) 12/20/2017   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 ([place]) 12/14/2015   Pancreatic insufficiency 01/25/2013   Ischemic cardiomyopathy, EF 50% by echo 08/01/13 07/19/2012   Chronic combined systolic and diastolic CHF (congestive heart failure) ([place]) 07/14/2012   Pulmonary edema, most likely due to diastolic dysfunction 48/18/5631   CAD, 07/14/12- LAD/PDA DES    Anxiety 12/12/2009   Hyperlipidemia 04/24/2007   Essential hypertension 04/24/2007   History of pancreatitis 04/24/2007   Past Surgical History:  Procedure Laterality Date   AMPUTATION Left 01/14/2018   Procedure: TRANSMETATARSAL AMPUTATION;  Surgeon: Newt Minion, MD;  Location: [facility];  Service: Orthopedics;  Laterality: Left;   BIOPSY  04/26/2019   Procedure: BIOPSY;  Surgeon: Irving Copas., MD;  Location: [facility];  Service: Gastroenterology;;   CARDIAC CATHETERIZATION  2011   minimal disease, medical management   COLONOSCOPY WITH PROPOFOL N/A 04/26/2019   Procedure: COLONOSCOPY WITH PROPOFOL;  Surgeon: Rush Landmark Telford Nab., MD;  Location: [facility];  Service: Gastroenterology;  Laterality: N/A;  CORONARY ANGIOPLASTY WITH STENT PLACEMENT  07/14/2012   successful PCI & stenting of mid LAD & PDA off the dominant CX   CORONARY/GRAFT ACUTE MI REVASCULARIZATION N/A 12/20/2017   Procedure: Coronary/Graft Acute MI Revascularization;  Surgeon: Martinique, Peter M, MD;  Location:  [place] [facility];  Service: Cardiovascular;  Laterality: N/A;   ENTEROSCOPY N/A 04/25/2019   Procedure: ENTEROSCOPY;  Surgeon: Rush Landmark Telford Nab., MD;  Location: [facility];  Service: Gastroenterology;  Laterality: N/A;   ESOPHAGOGASTRODUODENOSCOPY Left 04/24/2019   Procedure: ESOPHAGOGASTRODUODENOSCOPY (EGD);  Surgeon: Lavena Bullion, DO;  Location: [facility];  Service: Gastroenterology;  Laterality: Left;   HEMOSTASIS CLIP PLACEMENT  04/26/2019   Procedure: HEMOSTASIS CLIP PLACEMENT;  Surgeon: Irving Copas., MD;  Location: [facility];  Service: Gastroenterology;;   HOT HEMOSTASIS N/A 04/26/2019   Procedure: HOT HEMOSTASIS (ARGON PLASMA COAGULATION/BICAP);  Surgeon: Irving Copas., MD;  Location: [facility];  Service: Gastroenterology;  Laterality: N/A;   INCISION AND DRAINAGE ABSCESS ANAL  1970's   LEFT HEART CATH AND CORONARY ANGIOGRAPHY N/A 12/20/2017   Procedure: LEFT HEART CATH AND CORONARY ANGIOGRAPHY;  Surgeon: Martinique, Peter M, MD;  Location: [place] [facility];  Service: Cardiovascular;  Laterality: N/A;   LEFT HEART CATHETERIZATION WITH CORONARY ANGIOGRAM N/A 07/14/2012   Procedure: LEFT HEART CATHETERIZATION WITH CORONARY ANGIOGRAM;  Surgeon: [name], MD;  Location: [place] [facility];  Service: Cardiovascular;  Laterality: N/A;   PERCUTANEOUS CORONARY STENT INTERVENTION (PCI-S) Right 07/14/2012   Procedure: PERCUTANEOUS CORONARY STENT INTERVENTION (PCI-S);  Surgeon: [name], MD;  Location: [facility];  Service: Cardiovascular;  Laterality: Right;   POLYPECTOMY  04/26/2019   Procedure: POLYPECTOMY;  Surgeon: Mansouraty, Telford Nab., MD;  Location: [facility];  Service: Gastroenterology;;   SUBMUCOSAL TATTOO INJECTION  04/25/2019   Procedure: SUBMUCOSAL TATTOO INJECTION;  Surgeon: Irving Copas., MD;  [facility]: [facility];  Service: Gastroenterology;;   Family History  Problem Relation Age of Onset   Diabetes Mother    Diabetes  Paternal Grandmother    Diabetes Maternal Grandfather    Colon cancer Neg Hx    Outpatient Medications Prior to Visit  Medication Sig Dispense Refill   aspirin 81 MG tablet Take 1 tablet (81 mg total) by mouth daily. 30 tablet    Cholecalciferol (VITAMIN D3) 50 MCG (2000 UT) TABS Take 1 tablet by mouth daily.     escitalopram (LEXAPRO) 5 MG tablet Take 1 tablet (5 mg total) by mouth daily. 90 tablet 3   insulin glargine (LANTUS SOLOSTAR) 100 UNIT/ML Solostar Pen Inject 68 Units into the skin every morning. And pen needles 1/day 70 mL 3   Insulin Pen Needle (B-D UF III MINI PEN NEEDLES) 31G X 5 MM MISC USE TO INJECT INSULIN DAILY 100 each 0   Multiple Vitamins-Minerals (CENTRUM SILVER ULTRA MENS PO) Take 1 tablet by mouth daily.      ONETOUCH ULTRA test strip USE 1 STRIP TO CHECK GLUCOSE 4 TIMES DAILY 150 each 0   pantoprazole (PROTONIX) 40 MG tablet Take 1 tablet (40 mg total) by mouth daily. 90 tablet 0   rosuvastatin (CRESTOR) 40 MG tablet TAKE 1 TABLET(40 MG) BY MOUTH AT BEDTIME 30 tablet 6   carvedilol (COREG) 3.125 MG tablet Take 1 tablet (3.125 mg total) by mouth 2 (two) times daily with a meal. (Patient taking differently: Take 6.25 mg by mouth 2 (two) times daily with a meal.) 180 tablet 3   insulin lispro (HUMALOG) 100 UNIT/ML injection Inject 10 units  into the skin 3 times daily as needed for high blood sugar (over 300) E08.9 10 mL 3   lisinopril (ZESTRIL) 10 MG tablet TAKE 1 TABLET(10 MG) BY MOUTH DAILY (Patient taking differently: Take 10 mg by mouth daily. Only takes PRN if BP is greater than 150) 90 tablet 1   lipase/protease/amylase (CREON) 36000 UNITS CPEP capsule Take 2 capsules (72,000 Units total) by mouth 3 (three) times daily with meals. May also take 1 capsule (36,000 Units total) as needed (with snacks). (Patient not taking: Reported on 03/17/2021) 240 capsule 11   No facility-administered medications prior to visit.   Allergies  Allergen Reactions   Lipitor  [Atorvastatin] Other (See Comments)    Muscle weakness   Metformin And Related Diarrhea    Severe diarrhea     Objective:   Today's Vitals   03/17/21 1340  BP: 126/70  Pulse: 85  Temp: 97.8 F (36.6 C)  TempSrc: Temporal  SpO2: 99%  Weight: 211 lb 12.8 oz (96.1 kg)  Height: 6' (1.829 m)   Body mass index is 28.73 kg/m.   General: Well developed, well nourished. No acute distress. Mildly diaphoretic. HEENT: Normocephalic, non-traumatic. Left pinna has a small eschar on the upper helix (patient notes   he cut this shaving). EAC and TMs normal bilaterally. PERRL, though constricted, EOMI. Conjunctiva    clear. Fundiscopic exam shows normal disc and vasculature. Nose clear without congestion or   rhinorrhea. Mucous membranes moist. Oropharynx clear. Remaining teeth are in good repair.. Neck: Supple. No lymphadenopathy. No thyromegaly. Lungs: Clear to auscultation bilaterally. No wheezing, rales or rhonchi. CV: RRR without murmurs or rubs. Pulses 2+ bilaterally. Extremities: Full ROM. No joint swelling or tenderness. No edema noted. Left foot amputated mid foot.   There is a small callus on the distal sole. Most nails on the right foot are thickened and yellowed. Skin: There is a small healing pustule on the right buttocks cheek. No fluctuance or ulceration. No   surrounding erythema. Psych: Alert and oriented. Normal mood and affect.  Health Maintenance Due  Topic Date Due   COVID-19 Vaccine (1) Never done   OPHTHALMOLOGY EXAM  Never done   Hepatitis C Screening  Never done   Zoster Vaccines- Shingrix (1 of 2) Never done   PNA vac Low Risk Adult (2 of 2 - PPSV23) 12/05/2015     Assessment & Plan:   1. Chronic combined systolic and diastolic CHF (congestive heart failure) [facility]) Mr. Khalid's CHF is compensated currently. Based on pharmacist recommendation, we will switch him from carvedilol to metoprolol XL to try and reduce hypotensive episodes. Mr. Brandstetter's home blood  pressure cuff is getting readings higher than our manual readings here in the clinic. I recommend he reduce to checking his pressures once a day in the morning.  - metoprolol succinate (TOPROL-XL) 25 MG 24 hr tablet; Take 1 tablet (25 mg total) by mouth daily.  Dispense: 90 tablet; Refill: 3  2. Essential hypertension Mr. Mooty's blood pressure is at goal in the clinic today. We will make the change to metoprolol, as mentioned above. He would benefit from use of lisinopil daily for renal protection related to his diabetes. I will reduce the dose to 5 mg and have him try and take this consistently each day, rather than PRN.  - lisinopril (ZESTRIL) 5 MG tablet; Take 1 tablet (5 mg total) by mouth daily.  Dispense: 90 tablet; Refill: 3  3. 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 [employer]) Mr. Marchiano's  diabetes appears to remain fairly labile. He is interested in using a continuous blood glucose monitor. He has not discussed this with Dr. Loanne Drilling (endocrinology) at this point. I will check with Mr. Michaelle Birks to see what we might need to do to get him set up with this.Mr. Misener will return for his annual DM labs. I recommended he see his eye doctor for his annual dilated eye exam.  - insulin aspart (NOVOLOG) 100 UNIT/ML injection; Inject 10 units into the skin up to 3 times a day if blood sugar is greater than 150.  Dispense: 10 mL; Refill: PRN - Microalbumin / creatinine urine ratio; Future - Basic metabolic panel; Future - Hemoglobin A1c; Future - Urinalysis, Routine w reflex microscopic; Future  4. Other hyperlipidemia Due for repeat lipids. He will return fasting.  - Lipid panel; Future  5. Anxiety Mr. Benedick continues to express regular worries about his health. I suspect some of this is a manifestation of poorly controlled anxiety. As we are making some other changes in his medication at present., I will hold off on increasing his escitalopram until his next visit.  6.  Onychomycosis Follows with podiatry.  7. Skin pimple Recommend usual skin care. He could apply Neosporin as needed.  8. Pancreatic insufficiency I recommend that he take his Creon as prescribed. Inconsistent use could be impacting intestinal absorption of carbohydrates, leading to some of the inconsistency in his blood sugars. He should follow-up with Dr. Loanne Drilling regarding other alternative pancreatic enzyme products.   Haydee Salter, MD

## 2021-03-17 NOTE — Patient Instructions (Signed)
STOP carvedilol STOP lisinopril 10 mg daily as needed for systolic blood pressure > 150. START metoprolol ER 25 mg once a day START lisinopril 5 mg once a day

## 2021-03-20 ENCOUNTER — Telehealth: Payer: Self-pay

## 2021-03-20 NOTE — Progress Notes (Signed)
Spoke to patient to confirmed patient office appointment on 03/23/2021 for CCM at 3:00 pm with Junius Argyle the Clinical pharmacist.   Patient verbalized understanding. Patient aware to have all medications, supplements, blood pressure and blood sugar logs to visit.  Questions: Have you had any recent office visit or specialist visit outside of [place]?No Are there any concerns you would like to discuss during your office visit? Patient states he was prescribe a few new medication, and has some concerns he would like to discuss. Patient states he is having problems with his blood pressure.Patient states one minute his blood pressure is high around 167/90 and then another time during the day it is low.Patient states on 03/20/2021 at 10:00 am it was 121/74 and on 03/19/2021 in the morning it was 99/60. Patient states he has multiple doctor telling him different things.Patient states he is unsure who to listen to. Patient states his blood sugar was low around 56. Patient states he drunk some orange juice for relief.  Are you having any problems obtaining your medications? No  Star Rating Drug: Rosuvastatin 40 mg last filled on 12/02/2020 for 90 day supply at [facility]. Lisinopril 10 mg last filled on 03/17/2021 for 90 day supply at [facility]  Any gaps in medications fill history? None ID    Anderson Malta Clinical Pharmacist Assistant [phone]

## 2021-03-23 ENCOUNTER — Other Ambulatory Visit: Payer: Self-pay

## 2021-03-23 ENCOUNTER — Ambulatory Visit: Payer: Medicare Other

## 2021-03-23 DIAGNOSIS — I10 Essential (primary) hypertension: Secondary | ICD-10-CM

## 2021-03-23 DIAGNOSIS — I5032 Chronic diastolic (congestive) heart failure: Secondary | ICD-10-CM | POA: Diagnosis not present

## 2021-03-23 DIAGNOSIS — K8689 Other specified diseases of pancreas: Secondary | ICD-10-CM | POA: Diagnosis not present

## 2021-03-23 DIAGNOSIS — I5042 Chronic combined systolic (congestive) and diastolic (congestive) heart failure: Secondary | ICD-10-CM

## 2021-03-23 DIAGNOSIS — E089 Diabetes mellitus due to underlying condition without complications: Secondary | ICD-10-CM

## 2021-03-23 NOTE — Patient Instructions (Addendum)
Visit Information It was great speaking with you today!  Please let me know if you have any questions about our visit.  Plan:  Start Metoprolol 25 mg daily in the morning  Start Lisinopril 5 mg daily in the morning. If you see if your blood pressure is greater than 160, then you can take a second pill of lisinopril    Goals Addressed             This Visit's Progress    Monitor and Manage My Blood Sugar-Diabetes Type 2   On track    Timeframe:  Long-Range Goal Priority:  High Start Date: 03/10/2021                            Expected End Date: 09/09/2021                      Follow Up Date 04/14/2021    - check blood sugar before meals and at bedtime - check blood sugar before and after exercise - check blood sugar if I feel it is too high or too low - take the blood sugar log to all doctor visits   Why is this important?   Checking your blood sugar at home helps to keep it from getting very high or very low.  Writing the results in a diary or log helps the doctor know how to care for you.  Your blood sugar log should have the time, date and the results.  Also, write down the amount of insulin or other medicine that you take.  Other information, like what you ate, exercise done and how you were feeling, will also be helpful.     Notes:       Track and Manage Fluids and Swelling-Heart Failure   On track    Timeframe:  Long-Range Goal Priority:  High Start Date: 03/10/2021                            Expected End Date: 09/09/2021                      Follow Up Date 04/14/2021    - call office if I gain more than 2 pounds in one day or 5 pounds in one week - keep legs up while sitting - use salt in moderation - watch for swelling in feet, ankles and legs every day    Why is this important?   It is important to check your weight daily and watch how much salt and liquids you have.  It will help you to manage your heart failure.    Notes:          Patient Care Plan:  General Pharmacy (Adult)     Problem Identified: Hypertension, Hyperlipidemia, Diabetes, Heart Failure, Coronary Artery Disease, GERD, and Anxiety   Priority: High     Long-Range Goal: Patient-Specific Goal   Start Date: 03/10/2021  Expected End Date: 09/09/2021  This Visit's Progress: On track  Recent Progress: On track  Priority: High  Note:   Current Barriers:  Unable to independently afford treatment regimen Unable to achieve control of Diabetes  Suboptimal therapeutic regimen for hypertension/heart failure Unable to achieve control of Anxiety   Pharmacist Clinical Goal(s):  Patient will verbalize ability to afford treatment regimen achieve control of Diabetes as evidenced by A1c less than 8% adhere  to plan to optimize therapeutic regimen for hypertension as evidenced by report of adherence to recommended medication management changes Achieve control of anxiety via patient report through collaboration with PharmD and provider.   Interventions: 1:1 collaboration with Haydee Salter, MD regarding development and update of comprehensive plan of care as evidenced by provider attestation and co-signature Inter-disciplinary care team collaboration (see longitudinal plan of care) Comprehensive medication review performed; medication list updated in electronic medical record  Diabetes (A1c goal <8%) -Uncontrolled -Diagnosed 1988; history of pancreatic insufficiency  -History of Pancreatitis  -Current medications: Lantus 68 units daily (0.73 u/kg)  Humalog 10 units three times daily as needed for BG > 300: Uses a couple of times a week -Medications previously tried: Metformin, Levemir, Novolin N,   -Current home glucose readings fasting glucose: 222, 91, 91, 202, 473, 68   post prandial glucose: 209  -Reports hypoglycemic symptoms: Vision changes, weakness -Current meal patterns: sporadic meal patterns, does not eat three consistent meals daily   -Current exercise: Exercise  limited by toe amputation, Covid. Strength is lower. Trying to build up strength by walking up and down driveway   Heart Failure (Goal: manage symptoms and prevent exacerbations) -Not ideally controlled -managed by Dr. Sallyanne Kuster (last visit was June 2021)  -Last ejection fraction: 55-60% (Date: 02/12/18) -HF type: Diastolic -NYHA Class: I (no actitivty limitation) -AHA HF Stage: B (Heart disease present - no symptoms present) -Current treatment: Carvedilol 3.125 mg twice daily  Lisinopril 10 mg daily  -Medications previously tried: Furosemide, Imdur, Nebivolol, Quinapril, HCTZ, Spironolactone, Amlodipine -Patient has yet to start new regimen, as he wanted to wait until CPP visit today to discuss his concerns. He is very concerned about his labile blood pressure readings and wants to know what to do if he sees his blood pressures are elevated.  -Current home BP/HR readings: NA -Counseled patient to check blood pressure once daily  -Patient will start new regimen on 6/29, instructed patient he can take extra 5 mg lisinopril if blood pressure is greater than 128 systolic.   Hyperlipidemia: (LDL goal < 70) -Controlled -Current treatment: Rosuvastatin 40 mg nightly  -Medications previously tried: Simvastatin, Atorvastatin (Myalgia)   -Educated on Importance of limiting foods high in cholesterol; -Recommended to continue current medication  Anxiety (Goal: maintain stable mood) -Uncontrolled -Current treatment: Escitalopram 5 mg daily  -Medications previously tried/failed: NA -Hasn't felt much benefit from escitalopram. Mainly feels stressed regarding his health.  -PHQ9: 0 -GAD7: NA -Connected with None for mental health support -Recommended INCREASING escitalopram to 10 mg daily, will defer for now.    GERD (Goal: Prevent Heartburn/Reflux) -Controlled -History of esophagitis, duodenitis -Current treatment  None -Medications previously tried: Pantoprazole -Never got refilled in May.  Has mild-moderate symptoms largely driven by food triggers (Spaghetti, spicy foods, large meals)   -Recommended STOPPING pantoprazole given relative control without PPI for >1 month   Patient Goals/Self-Care Activities Patient will:  - check glucose 2-3 times daily, document, and provide at future appointments check blood pressure daily, document, and provide at future appointments  Follow Up Plan: Telephone follow up appointment with care management team member scheduled for:  04/14/2021 at 4:00 PM    Print copy of patient instructions, educational materials, and care plan provided in person. Telephone follow up appointment with pharmacy team member scheduled for: 04/14/2021 at 4:00 PM  Junius Argyle, PharmD, CPP Clinical Pharmacist [facility] at [facility]  [phone]

## 2021-03-23 NOTE — Progress Notes (Signed)
Chronic Care Management Pharmacy Note  03/23/2021 Name:  David Lane MRN:  [id number] DOB:  1945-10-30  Summary: Patient picked up new prescriptions from his last PCP appointment, but had not started them yet as he wanted to wait to discuss them with myself today. He continues to feel nervous about his labile blood pressure and blood sugar readings. We attempted to place CGM sensor on patient today, but phone was not compatible so we will have to defer for now.   Recommendations/Changes made from today's visit: START new Rxs Lisinopril 5 mg daily and Metoprolol XL 25 mg daily. Instructed patient to take extra 5 mg lisinopril if blood pressure was greater than 099 systolic.   Plan: Health concierge to work on patient assistance for [employer] 2 documentation sent for medicare approval  CPP follow-up in 3 weeks to recheck blood pressure  PCP labwork in one week   Subjective: David Lane is an 75 y.o. year old male who is a primary patient of Rudd, Lillette Boxer, MD.  The CCM team was consulted for assistance with disease management and care coordination needs.    Engaged with patient by telephone for initial visit in response to provider referral for pharmacy case management and/or care coordination services.   Consent to Services:  The patient was given the following information about Chronic Care Management services today, agreed to services, and gave verbal consent: 1. CCM service includes personalized support from designated clinical staff supervised by the primary care provider, including individualized plan of care and coordination with other care providers 2. 24/7 contact phone numbers for assistance for urgent and routine care needs. 3. Service will only be billed when office clinical staff spend 20 minutes or more in a month to coordinate care. 4. Only one practitioner may furnish and bill the service in a calendar month. 5.The patient may stop CCM services at any  time (effective at the end of the month) by phone call to the office staff. 6. The patient will be responsible for cost sharing (co-pay) of up to 20% of the service fee (after annual deductible is met). Patient agreed to services and consent obtained.  Patient Care Team: Haydee Salter, MD as PCP - General (Family Medicine) [name], MD as PCP - Cardiology (Cardiology) Germaine Pomfret, [facility] as Pharmacist (Pharmacist)  Recent office visits: 10/27/2020 Dr.Rudd MD (PCP) Renew Creon 36000 Units, 2 capsule 3 times daily, may take 1 capsules PRN.Renew Lexapro 5 mg 1 daily.updated his Rx to match his current dosing -  insulin lispro (HUMALOG) 100 UNIT/ML injection; Inject 10 units into the skin 3 times daily as needed for high blood sugar  Recent consult visits: 11/20/2020 Dr.Ellison MD (Endocrinology). A1c 8.2%.  10/16/2020 Colin Benton DO (Family medicine) start benzonatate 100 mg/ PRN,Ambulatory referral for [facility] visits: None in previous 6 months   Objective:  Lab Results  Component Value Date   CREATININE 1.25 (H) 08/21/2019   BUN 24 (H) 08/21/2019   GFR 69.70 05/08/2019   GFRNONAA 58 (L) 08/21/2019   GFRAA >60 08/21/2019   NA 137 08/21/2019   K 4.2 08/21/2019   CALCIUM 8.7 (L) 08/21/2019   CO2 24 08/21/2019   GLUCOSE 54 (L) 08/21/2019    Lab Results  Component Value Date/Time   HGBA1C 8.2 (A) 11/20/2020 02:24 PM   HGBA1C 8.8 (A) 08/14/2020 09:51 AM   HGBA1C 10.0 (H) 12/21/2017 03:45 AM   HGBA1C 10.0 (H) 12/20/2017 03:01 PM  FRUCTOSAMINE 428 (H) 04/21/2016 08:33 AM   GFR 69.70 05/08/2019 03:33 PM   GFR 60.01 04/21/2016 08:33 AM   MICROALBUR 1.5 11/25/2014 01:39 PM   MICROALBUR 8.2 (H) 01/24/2013 04:40 PM    Last diabetic Eye exam: No results found for: HMDIABEYEEXA  Last diabetic Foot exam: No results found for: HMDIABFOOTEX   Lab Results  Component Value Date   CHOL 102 12/21/2017   HDL 24 (L) 12/21/2017   LDLCALC 64 12/21/2017   TRIG  70 12/21/2017   CHOLHDL 4.3 12/21/2017    Hepatic Function Latest Ref Rng & Units 08/21/2019 08/09/2019 05/08/2019  Total Protein 6.5 - 8.1 g/dL 7.5 6.2(L) 6.3  Albumin 3.5 - 5.0 g/dL 3.8 3.2(L) 3.7  AST 15 - 41 U/L 28 41 21  ALT 0 - 44 U/L 47(H) 69(H) 32  Alk Phosphatase 38 - 126 U/L 98 102 89  Total Bilirubin 0.3 - 1.2 mg/dL 0.8 0.6 0.5  Bilirubin, Direct 0.0 - 0.2 mg/dL - - -    Lab Results  Component Value Date/Time   TSH 2.426 08/21/2019 05:14 PM   TSH 1.94 10/06/2018 03:05 PM   TSH 2.31 11/25/2014 01:39 PM    CBC Latest Ref Rng & Units 08/21/2019 08/09/2019 05/08/2019  WBC 4.0 - 10.5 K/uL 10.5 8.2 6.5  Hemoglobin 13.0 - 17.0 g/dL 13.9 12.5(L) 10.9(L)  Hematocrit 39.0 - 52.0 % 41.9 37.6(L) 32.5(L)  Platelets 150 - 400 K/uL 236 227 279.0    Lab Results  Component Value Date/Time   VD25OH 15 (L) 10/06/2018 03:05 PM    Clinical ASCVD: Yes  The ASCVD Risk score Mikey Bussing DC Jr., et al., 2013) failed to calculate for the following reasons:   The patient has a prior MI or stroke diagnosis    Depression screen [facility] 2/9 10/27/2020 12/05/2014  Decreased Interest 0 0  Down, Depressed, Hopeless 0 0  PHQ - 2 Score 0 0  Some recent data might be hidden    Social History   Tobacco Use  Smoking Status Never  Smokeless Tobacco Never   BP Readings from Last 3 Encounters:  03/17/21 126/70  11/20/20 (!) 160/80  10/27/20 132/74   Pulse Readings from Last 3 Encounters:  03/17/21 85  11/20/20 91  10/27/20 91   Wt Readings from Last 3 Encounters:  03/17/21 211 lb 12.8 oz (96.1 kg)  11/20/20 204 lb 6.4 oz (92.7 kg)  10/27/20 203 lb (92.1 kg)   BMI Readings from Last 3 Encounters:  03/17/21 28.73 kg/m  11/20/20 27.72 kg/m  10/27/20 27.53 kg/m    Assessment/Interventions: Review of patient past medical history, allergies, medications, health status, including review of consultants reports, laboratory and other test data, was performed as part of comprehensive evaluation and  provision of chronic care management services.   SDOH:  (Social Determinants of Health) assessments and interventions performed: Yes   SDOH Screenings   Alcohol Screen: Not on file  Depression (PHQ2-9): Low Risk    PHQ-2 Score: 0  Financial Resource Strain: High Risk   Difficulty of Paying Living Expenses: Hard  Food Insecurity: Not on file  Housing: Not on file  Physical Activity: Not on file  Social Connections: Not on file  Stress: Not on file  Tobacco Use: Low Risk    Smoking Tobacco Use: Never   Smokeless Tobacco Use: Never  Transportation Needs: Not on file    CCM Care Plan  Allergies  Allergen Reactions   Lipitor [Atorvastatin] Other (See Comments)    Muscle  weakness   Metformin And Related Diarrhea    Severe diarrhea    Medications Reviewed Today     Reviewed by Haydee Salter, MD (Physician) on 03/17/21 at [place] List Status: <None>   Medication Order Taking? Sig Documenting Provider Last Dose Status Informant  aspirin 81 MG tablet [id number] Yes Take 1 tablet (81 mg total) by mouth daily. Hosie Poisson, MD Taking Active Self  Patient taking differently:  Discontinued 03/17/21 1433 (Discontinued by provider)   Cholecalciferol (VITAMIN D3) 50 MCG (2000 UT) TABS [id number] Yes Take 1 tablet by mouth daily. [provider] Taking Active Self  escitalopram (LEXAPRO) 5 MG tablet [id number] Yes Take 1 tablet (5 mg total) by mouth daily. Haydee Salter, MD Taking Active   insulin glargine (LANTUS SOLOSTAR) 100 UNIT/ML Solostar Pen [id number] Yes Inject 68 Units into the skin every morning. And pen needles 1/day Renato Shin, MD Taking Active   Discontinued 03/17/21 1434 (Change in therapy)   Insulin Pen Needle (B-D UF III MINI PEN NEEDLES) 31G X 5 MM MISC [id number] Yes USE TO INJECT INSULIN DAILY Renato Shin, MD Taking Active   lipase/protease/amylase (CREON) 36000 UNITS CPEP capsule [id number] No Take 2 capsules (72,000 Units total) by mouth 3 (three) times  daily with meals. May also take 1 capsule (36,000 Units total) as needed (with snacks).  Patient not taking: Reported on 03/17/2021   Haydee Salter, MD Not Taking Active   Patient taking differently:  Discontinued 03/17/21 1435 (Change in therapy)   Multiple Vitamins-Minerals (CENTRUM SILVER ULTRA MENS PO) [id number] Yes Take 1 tablet by mouth daily.  [provider] Taking Active Self  Donald Siva test strip [id number] Yes USE 1 STRIP TO CHECK GLUCOSE 4 TIMES DAILY Renato Shin, MD Taking Active   pantoprazole (PROTONIX) 40 MG tablet [id number] Yes Take 1 tablet (40 mg total) by mouth daily. Haydee Salter, MD Taking Active   rosuvastatin (CRESTOR) 40 MG tablet [id number] Yes TAKE 1 TABLET(40 MG) BY MOUTH AT BEDTIME Haydee Salter, MD Taking Active             Patient Active Problem List   Diagnosis Date Noted   Onychomycosis 03/17/2021   Skin pimple 03/17/2021   Coronary artery disease involving native coronary artery of native heart without angina pectoris 06/21/2019   AVM (arteriovenous malformation) of colon    History of colonic polyps    Duodenitis    Gastroesophageal reflux disease with esophagitis    Acute upper GI bleed 04/23/2019   Labile blood glucose    Functional gait disorder 01/20/2018   Acute blood loss anemia 01/20/2018   Status post transmetatarsal amputation of left foot ([place]) 01/20/2018   Status post coronary artery stent placement    Acute ST elevation myocardial infarction (STEMI) involving left anterior descending (LAD) coronary artery ([place]) 12/20/2017   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 ([place]) 12/14/2015   Pancreatic insufficiency 01/25/2013   Ischemic cardiomyopathy, EF 50% by echo 08/01/13 07/19/2012   Chronic combined systolic and diastolic CHF (congestive heart failure) ([place]) 07/14/2012   Pulmonary edema, most likely due to diastolic dysfunction 90/38/3338   CAD, 07/14/12- LAD/PDA DES    Anxiety 12/12/2009    Hyperlipidemia 04/24/2007   Essential hypertension 04/24/2007   History of pancreatitis 04/24/2007    Immunization History  Administered Date(s) Administered   Pneumococcal Conjugate-13 12/05/2014   Td 12/04/2013    Conditions to be addressed/monitored:  Hypertension, Hyperlipidemia, Diabetes, Heart Failure, Coronary Artery Disease, GERD, and  Anxiety  There are no care plans that you recently modified to display for this patient.    Medication Assistance: Application for [employer]  medication assistance program. in process.  Anticipated assistance start date TBD.  See plan of care for additional detail.  Compliance/Adherence/Medication fill history: Care Gaps: Covid-19 Hepatitis C Shingrix PPSV23   Star-Rating Drugs: Rosuvastatin 40 mg last filled on 12/02/2020 for 90 day supply at [facility]. Lisinopril 10 mg last filled on 10/07/2020 for 90 day supply at [facility].  Patient's preferred pharmacy is:  [place], [place] - [address] [place] [address] Phone: [phone] Fax: [place], [place] [address] [place] [address] Phone: [phone] Fax: [phone]  Uses pill box? Yes Pt endorses 100% compliance  We discussed: Current pharmacy is preferred with insurance plan and patient is satisfied with pharmacy services Patient decided to: Continue current medication management strategy  Care Plan and Follow Up Patient Decision:  Patient agrees to Care Plan and Follow-up.  Plan: Telephone follow up appointment with care management team member scheduled for:  04/14/2021 at 4:00 PM  Junius Argyle, PharmD, CPP Clinical Pharmacist [facility] at [facility]  [phone]

## 2021-04-13 ENCOUNTER — Telehealth: Payer: Self-pay

## 2021-04-13 NOTE — Progress Notes (Signed)
Unable to leave a voice message to confirmed patient Office appointment on 04/14/2021 for CCM at 4:00 pm with Junius Argyle the Clinical pharmacist.    Care Gaps: COVID-19 Vaccine Ophthalmology Exam Hepatitis C screening Shingrix PNA Vaccine  Star Rating Drug: Rosuvastatin 40 mg last filled on 04/13/2021 for 90 day supply at [facility]. Lisinopril 10 mg last filled on 03/17/2021 for 90 day supply at [facility]  Any gaps in medications fill history? Novolog Insulin U-100 last filled 08/20/2020 for 30 day supply    [place] Pharmacist Assistant [phone]

## 2021-04-14 ENCOUNTER — Telehealth: Payer: Medicare Other

## 2021-04-14 NOTE — Progress Notes (Signed)
Patient states he would like to move his appointment to 05/26/2021 at 3pm for telephone follow up with the Clinical Pharmacist.Patient states he prefers to get his lab work done before his follow up appointment with the clinical pharmacist.Inform patient to reach out to his PCP to schedule a lab appointment.Patient verbalized understanding.Notified Clinical pharmacist.  Per Clinical Pharmacist, he will reschedule patient appointment to 05/26/2021 at 3:00 pm for a telephone follow up.  [place] Pharmacist Assistant [phone]

## 2021-05-25 ENCOUNTER — Telehealth: Payer: Self-pay

## 2021-05-25 NOTE — Progress Notes (Signed)
Unable to leave voice message due to mailbox to confirmed patient telephone appointment on 05/26/2021 for CCM at 3:00 pm with Junius Argyle the Clinical pharmacist.   [place] Pharmacist Assistant [phone]

## 2021-05-26 ENCOUNTER — Telehealth: Payer: Medicare Other

## 2021-05-26 NOTE — Progress Notes (Deleted)
Chronic Care Management Pharmacy Note  05/26/2021 Name:  David Lane MRN:  [id number] DOB:  06-06-1946  Summary: Patient picked up new prescriptions from his last PCP appointment, but had not started them yet as he wanted to wait to discuss them with myself today. He continues to feel nervous about his labile blood pressure and blood sugar readings. We attempted to place CGM sensor on patient today, but phone was not compatible so we will have to defer for now.   Recommendations/Changes made from today's visit: START new Rxs Lisinopril 5 mg daily and Metoprolol XL 25 mg daily. Instructed patient to take extra 5 mg lisinopril if blood pressure was greater than 224 systolic.   Plan: Health concierge to work on patient assistance for [employer] 2 documentation sent for medicare approval  CPP follow-up in 3 weeks to recheck blood pressure  PCP labwork in one week   Subjective: David Lane is an 75 y.o. year old male who is a primary patient of Rudd, Lillette Boxer, MD.  The CCM team was consulted for assistance with disease management and care coordination needs.    Engaged with patient by telephone for initial visit in response to provider referral for pharmacy case management and/or care coordination services.   Consent to Services:  The patient was given the following information about Chronic Care Management services today, agreed to services, and gave verbal consent: 1. CCM service includes personalized support from designated clinical staff supervised by the primary care provider, including individualized plan of care and coordination with other care providers 2. 24/7 contact phone numbers for assistance for urgent and routine care needs. 3. Service will only be billed when office clinical staff spend 20 minutes or more in a month to coordinate care. 4. Only one practitioner may furnish and bill the service in a calendar month. 5.The patient may stop CCM services at any  time (effective at the end of the month) by phone call to the office staff. 6. The patient will be responsible for cost sharing (co-pay) of up to 20% of the service fee (after annual deductible is met). Patient agreed to services and consent obtained.  Patient Care Team: Haydee Salter, MD as PCP - General (Family Medicine) Croitoru, Dani Gobble, MD as PCP - Cardiology (Cardiology) Germaine Pomfret, [facility] as Pharmacist (Pharmacist) Renato Shin, MD as Consulting Physician (Endocrinology)  Recent office visits: 10/27/2020 Dr.Rudd MD (PCP) Renew Creon 36000 Units, 2 capsule 3 times daily, may take 1 capsules PRN.Renew Lexapro 5 mg 1 daily.updated his Rx to match his current dosing -  insulin lispro (HUMALOG) 100 UNIT/ML injection; Inject 10 units into the skin 3 times daily as needed for high blood sugar  Recent consult visits: 11/20/2020 Dr.Ellison MD (Endocrinology). A1c 8.2%.  10/16/2020 Colin Benton DO (Family medicine) start benzonatate 100 mg/ PRN,Ambulatory referral for [facility] visits: None in previous 6 months   Objective:  Lab Results  Component Value Date   CREATININE 1.25 (H) 08/21/2019   BUN 24 (H) 08/21/2019   GFR 69.70 05/08/2019   GFRNONAA 58 (L) 08/21/2019   GFRAA >60 08/21/2019   NA 137 08/21/2019   K 4.2 08/21/2019   CALCIUM 8.7 (L) 08/21/2019   CO2 24 08/21/2019   GLUCOSE 54 (L) 08/21/2019    Lab Results  Component Value Date/Time   HGBA1C 8.2 (A) 11/20/2020 02:24 PM   HGBA1C 8.8 (A) 08/14/2020 09:51 AM   HGBA1C 10.0 (H) 12/21/2017 03:45 AM  HGBA1C 10.0 (H) 12/20/2017 03:01 PM   FRUCTOSAMINE 428 (H) 04/21/2016 08:33 AM   GFR 69.70 05/08/2019 03:33 PM   GFR 60.01 04/21/2016 08:33 AM   MICROALBUR 1.5 11/25/2014 01:39 PM   MICROALBUR 8.2 (H) 01/24/2013 04:40 PM    Last diabetic Eye exam: No results found for: HMDIABEYEEXA  Last diabetic Foot exam: No results found for: HMDIABFOOTEX   Lab Results  Component Value Date   CHOL 102 12/21/2017    HDL 24 (L) 12/21/2017   LDLCALC 64 12/21/2017   TRIG 70 12/21/2017   CHOLHDL 4.3 12/21/2017    Hepatic Function Latest Ref Rng & Units 08/21/2019 08/09/2019 05/08/2019  Total Protein 6.5 - 8.1 g/dL 7.5 6.2(L) 6.3  Albumin 3.5 - 5.0 g/dL 3.8 3.2(L) 3.7  AST 15 - 41 U/L 28 41 21  ALT 0 - 44 U/L 47(H) 69(H) 32  Alk Phosphatase 38 - 126 U/L 98 102 89  Total Bilirubin 0.3 - 1.2 mg/dL 0.8 0.6 0.5  Bilirubin, Direct 0.0 - 0.2 mg/dL - - -    Lab Results  Component Value Date/Time   TSH 2.426 08/21/2019 05:14 PM   TSH 1.94 10/06/2018 03:05 PM   TSH 2.31 11/25/2014 01:39 PM    CBC Latest Ref Rng & Units 08/21/2019 08/09/2019 05/08/2019  WBC 4.0 - 10.5 K/uL 10.5 8.2 6.5  Hemoglobin 13.0 - 17.0 g/dL 13.9 12.5(L) 10.9(L)  Hematocrit 39.0 - 52.0 % 41.9 37.6(L) 32.5(L)  Platelets 150 - 400 K/uL 236 227 279.0    Lab Results  Component Value Date/Time   VD25OH 15 (L) 10/06/2018 03:05 PM    Clinical ASCVD: Yes  The ASCVD Risk score Mikey Bussing DC Jr., et al., 2013) failed to calculate for the following reasons:   The patient has a prior MI or stroke diagnosis    Depression screen [facility] 2/9 10/27/2020 12/05/2014  Decreased Interest 0 0  Down, Depressed, Hopeless 0 0  PHQ - 2 Score 0 0  Some recent data might be hidden    Social History   Tobacco Use  Smoking Status Never  Smokeless Tobacco Never   BP Readings from Last 3 Encounters:  03/17/21 126/70  11/20/20 (!) 160/80  10/27/20 132/74   Pulse Readings from Last 3 Encounters:  03/17/21 85  11/20/20 91  10/27/20 91   Wt Readings from Last 3 Encounters:  03/17/21 211 lb 12.8 oz (96.1 kg)  11/20/20 204 lb 6.4 oz (92.7 kg)  10/27/20 203 lb (92.1 kg)   BMI Readings from Last 3 Encounters:  03/17/21 28.73 kg/m  11/20/20 27.72 kg/m  10/27/20 27.53 kg/m    Assessment/Interventions: Review of patient past medical history, allergies, medications, health status, including review of consultants reports, laboratory and other test  data, was performed as part of comprehensive evaluation and provision of chronic care management services.   SDOH:  (Social Determinants of Health) assessments and interventions performed: Yes   SDOH Screenings   Alcohol Screen: Not on file  Depression (PHQ2-9): Low Risk    PHQ-2 Score: 0  Financial Resource Strain: High Risk   Difficulty of Paying Living Expenses: Hard  Food Insecurity: Not on file  Housing: Not on file  Physical Activity: Not on file  Social Connections: Not on file  Stress: Not on file  Tobacco Use: Low Risk    Smoking Tobacco Use: Never   Smokeless Tobacco Use: Never  Transportation Needs: Not on file    CCM Care Plan  Allergies  Allergen Reactions   Lipitor [  Atorvastatin] Other (See Comments)    Muscle weakness   Metformin And Related Diarrhea    Severe diarrhea    Medications Reviewed Today     Reviewed by Haydee Salter, MD (Physician) on 03/17/21 at [place]  Med List Status: <None>   Medication Order Taking? Sig Documenting Provider Last Dose Status Informant  aspirin 81 MG tablet [id number] Yes Take 1 tablet (81 mg total) by mouth daily. Hosie Poisson, MD Taking Active Self  Patient taking differently:  Discontinued 03/17/21 1433 (Discontinued by provider)   Cholecalciferol (VITAMIN D3) 50 MCG (2000 UT) TABS [id number] Yes Take 1 tablet by mouth daily. [provider] Taking Active Self  escitalopram (LEXAPRO) 5 MG tablet [id number] Yes Take 1 tablet (5 mg total) by mouth daily. Haydee Salter, MD Taking Active   insulin glargine (LANTUS SOLOSTAR) 100 UNIT/ML Solostar Pen [id number] Yes Inject 68 Units into the skin every morning. And pen needles 1/day Renato Shin, MD Taking Active   Discontinued 03/17/21 1434 (Change in therapy)   Insulin Pen Needle (B-D UF III MINI PEN NEEDLES) 31G X 5 MM MISC [id number] Yes USE TO INJECT INSULIN DAILY Renato Shin, MD Taking Active   lipase/protease/amylase (CREON) 36000 UNITS CPEP capsule [id number] No  Take 2 capsules (72,000 Units total) by mouth 3 (three) times daily with meals. May also take 1 capsule (36,000 Units total) as needed (with snacks).  Patient not taking: Reported on 03/17/2021   Haydee Salter, MD Not Taking Active   Patient taking differently:  Discontinued 03/17/21 1435 (Change in therapy)   Multiple Vitamins-Minerals (CENTRUM SILVER ULTRA MENS PO) [id number] Yes Take 1 tablet by mouth daily.  [provider] Taking Active Self  Donald Siva test strip [id number] Yes USE 1 STRIP TO CHECK GLUCOSE 4 TIMES DAILY Renato Shin, MD Taking Active   pantoprazole (PROTONIX) 40 MG tablet [id number] Yes Take 1 tablet (40 mg total) by mouth daily. Haydee Salter, MD Taking Active   rosuvastatin (CRESTOR) 40 MG tablet [id number] Yes TAKE 1 TABLET(40 MG) BY MOUTH AT BEDTIME Haydee Salter, MD Taking Active             Patient Active Problem List   Diagnosis Date Noted   Onychomycosis 03/17/2021   Skin pimple 03/17/2021   Coronary artery disease involving native coronary artery of native heart without angina pectoris 06/21/2019   AVM (arteriovenous malformation) of colon    History of colonic polyps    Duodenitis    Gastroesophageal reflux disease with esophagitis    Acute upper GI bleed 04/23/2019   Labile blood glucose    Functional gait disorder 01/20/2018   Acute blood loss anemia 01/20/2018   Status post transmetatarsal amputation of left foot ([place]) 01/20/2018   Status post coronary artery stent placement    Acute ST elevation myocardial infarction (STEMI) involving left anterior descending (LAD) coronary artery ([place]) 12/20/2017   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 ([place]) 12/14/2015   Pancreatic insufficiency 01/25/2013   Ischemic cardiomyopathy, EF 50% by echo 08/01/13 07/19/2012   Chronic combined systolic and diastolic CHF (congestive heart failure) ([place]) 07/14/2012   Pulmonary edema, most likely due to diastolic dysfunction 63/78/5885    CAD, 07/14/12- LAD/PDA DES    Anxiety 12/12/2009   Hyperlipidemia 04/24/2007   Essential hypertension 04/24/2007   History of pancreatitis 04/24/2007    Immunization History  Administered Date(s) Administered   Pneumococcal Conjugate-13 12/05/2014   Td 12/04/2013    Conditions to be addressed/monitored:  Hypertension, Hyperlipidemia,  Diabetes, Heart Failure, Coronary Artery Disease, GERD, and Anxiety  There are no care plans that you recently modified to display for this patient.    Medication Assistance: Application for [employer]  medication assistance program. in process.  Anticipated assistance start date TBD.  See plan of care for additional detail.  Compliance/Adherence/Medication fill history: Care Gaps: Covid-19 Hepatitis C Shingrix PPSV23   Star-Rating Drugs: Rosuvastatin 40 mg last filled on 12/02/2020 for 90 day supply at [facility]. Lisinopril 10 mg last filled on 10/07/2020 for 90 day supply at [facility].  Patient's preferred pharmacy is:  [facility], [place] - [address] [place] [address] Phone: [phone] Fax: [facility], [place] - [address] AT [facility] [place] [address] Phone: [phone] Fax: [phone]  Uses pill box? Yes Pt endorses 100% compliance  We discussed: Current pharmacy is preferred with insurance plan and patient is satisfied with pharmacy services Patient decided to: Continue current medication management strategy  Care Plan and Follow Up Patient Decision:  Patient agrees to Care Plan and Follow-up.  Plan: Telephone follow up appointment with care management team member scheduled for:  04/14/2021 at 4:00 PM  Junius Argyle, PharmD, CPP Clinical Pharmacist [facility] [place]  Current Barriers:  Unable to independently afford treatment regimen Unable to achieve  control of Diabetes  Suboptimal therapeutic regimen for hypertension/heart failure Unable to achieve control of Anxiety   Pharmacist Clinical Goal(s):  Patient will verbalize ability to afford treatment regimen achieve control of Diabetes as evidenced by A1c less than 8% adhere to plan to optimize therapeutic regimen for hypertension as evidenced by report of adherence to recommended medication management changes Achieve control of anxiety via patient report through collaboration with PharmD and provider.   Interventions: 1:1 collaboration with Haydee Salter, MD regarding development and update of comprehensive plan of care as evidenced by provider attestation and co-signature Inter-disciplinary care team collaboration (see longitudinal plan of care) Comprehensive medication review performed; medication list updated in electronic medical record  Diabetes (A1c goal <8%) -Uncontrolled -Diagnosed 1988; history of pancreatic insufficiency  -History of Pancreatitis  -Current medications: Lantus 68 units daily (0.73 u/kg)  Humalog 10 units three times daily as needed for BG > 300: Uses a couple of times a week -Medications previously tried: Metformin, Levemir, Novolin N,   -Current home glucose readings fasting glucose: 222, 91, 91, 202, 473, 68   post prandial glucose: 209  -Reports hypoglycemic symptoms: Vision changes, weakness -Current meal patterns: sporadic meal patterns, does not eat three consistent meals daily   -Current exercise: Exercise limited by toe amputation, Covid. Strength is lower. Trying to build up strength by walking up and down driveway   Heart Failure (Goal: manage symptoms and prevent exacerbations) -Not ideally controlled -managed by Dr. Sallyanne Kuster (last visit was June 2021)  -Last ejection fraction: 55-60% (Date: 02/12/18) -HF type: Diastolic -NYHA Class: I (no actitivty limitation) -AHA HF Stage: B (Heart disease present - no symptoms present) -Current  treatment: Carvedilol 3.125 mg twice daily  Lisinopril 10 mg daily  -Medications previously tried: Furosemide, Imdur, Nebivolol, Quinapril, HCTZ, Spironolactone, Amlodipine -Patient has yet to start new regimen, as he wanted to wait until CPP visit today to discuss his concerns. He is very concerned about his labile blood pressure readings and wants to know what to do if he sees his blood pressures are elevated.  -Current  home BP/HR readings: NA -Counseled patient to check blood pressure once daily  -Patient will start new regimen on 6/29, instructed patient he can take extra 5 mg lisinopril if blood pressure is greater than 115 systolic.   Hyperlipidemia: (LDL goal < 70) -Controlled -Current treatment: Rosuvastatin 40 mg nightly  -Medications previously tried: Simvastatin, Atorvastatin (Myalgia)   -Educated on Importance of limiting foods high in cholesterol; -Recommended to continue current medication  Anxiety (Goal: maintain stable mood) -Uncontrolled -Current treatment: Escitalopram 5 mg daily  -Medications previously tried/failed: NA -Hasn't felt much benefit from escitalopram. Mainly feels stressed regarding his health.  -PHQ9: 0 -GAD7: NA -Connected with None for mental health support -Recommended INCREASING escitalopram to 10 mg daily, will defer for now.    GERD (Goal: Prevent Heartburn/Reflux) -Controlled -History of esophagitis, duodenitis -Current treatment  None -Medications previously tried: Pantoprazole -Never got refilled in May. Has mild-moderate symptoms largely driven by food triggers (Spaghetti, spicy foods, large meals)   -Recommended STOPPING pantoprazole given relative control without PPI for >1 month   Patient Goals/Self-Care Activities Patient will:  - check glucose 2-3 times daily, document, and provide at future appointments check blood pressure daily, document, and provide at future appointments  Follow Up Plan: Telephone follow up appointment  with care management team member scheduled for:  04/14/2021 at 4:00 PM

## 2021-06-06 ENCOUNTER — Telehealth: Payer: Self-pay

## 2021-06-06 NOTE — Telephone Encounter (Signed)
Unable to reach pt, unable to LVM  to schedule AWV

## 2021-06-15 ENCOUNTER — Telehealth: Payer: Self-pay

## 2021-06-15 NOTE — Progress Notes (Signed)
Chronic Care Management Pharmacy Assistant   Name: David Lane  MRN: [id number] DOB: [birth year]  Reason for Encounter: Diabetes Disease State Call.   Recent office visits:  No recent Office Visit  Recent consult visits:  No recent [facility] visits:  None in previous 6 months  Medications: Outpatient Encounter Medications as of 06/15/2021  Medication Sig   aspirin 81 MG tablet Take 1 tablet (81 mg total) by mouth daily.   Cholecalciferol (VITAMIN D3) 50 MCG (2000 UT) TABS Take 1 tablet by mouth daily.   escitalopram (LEXAPRO) 5 MG tablet Take 1 tablet (5 mg total) by mouth daily.   insulin aspart (NOVOLOG) 100 UNIT/ML injection Inject 10 units into the skin up to 3 times a day if blood sugar is greater than 150.   insulin glargine (LANTUS SOLOSTAR) 100 UNIT/ML Solostar Pen Inject 68 Units into the skin every morning. And pen needles 1/day   Insulin Pen Needle (B-D UF III MINI PEN NEEDLES) 31G X 5 MM MISC USE TO INJECT INSULIN DAILY   lipase/protease/amylase (CREON) 36000 UNITS CPEP capsule Take 2 capsules (72,000 Units total) by mouth 3 (three) times daily with meals. May also take 1 capsule (36,000 Units total) as needed (with snacks). (Patient not taking: Reported on 03/17/2021)   lisinopril (ZESTRIL) 5 MG tablet Take 1 tablet (5 mg total) by mouth daily.   metoprolol succinate (TOPROL-XL) 25 MG 24 hr tablet Take 1 tablet (25 mg total) by mouth daily.   Multiple Vitamins-Minerals (CENTRUM SILVER ULTRA MENS PO) Take 1 tablet by mouth daily.    ONETOUCH ULTRA test strip USE 1 STRIP TO CHECK GLUCOSE 4 TIMES DAILY   pantoprazole (PROTONIX) 40 MG tablet Take 1 tablet (40 mg total) by mouth daily.   rosuvastatin (CRESTOR) 40 MG tablet TAKE 1 TABLET(40 MG) BY MOUTH AT BEDTIME   No facility-administered encounter medications on file as of 06/15/2021.    Care Gaps: COVID-19 Vaccine Ophthalmology Exam Hepatitis C Screening Shingrix Vaccine  Star Rating  Drug: Rosuvastatin 40 mg last filled on 04/13/2021 for 90 day supply at [facility]. Lisinopril 10 mg last filled on 03/17/2021 for 90 day supply at [facility] Medication Fill Gaps: None ID   Recent Relevant Labs: Lab Results  Component Value Date/Time   HGBA1C 8.2 (A) 11/20/2020 02:24 PM   HGBA1C 8.8 (A) 08/14/2020 09:51 AM   HGBA1C 10.0 (H) 12/21/2017 03:45 AM   HGBA1C 10.0 (H) 12/20/2017 03:01 PM   MICROALBUR 1.5 11/25/2014 01:39 PM   MICROALBUR 8.2 (H) 01/24/2013 04:40 PM    Kidney Function Lab Results  Component Value Date/Time   CREATININE 1.25 (H) 08/21/2019 05:14 PM   CREATININE 0.93 08/09/2019 03:11 PM   CREATININE 0.78 09/14/2013 08:20 AM   GFR 69.70 05/08/2019 03:33 PM   GFRNONAA 58 (L) 08/21/2019 05:14 PM   GFRAA >60 08/21/2019 05:14 PM    Current antihyperglycemic regimen:  Lantus 68 units daily (0.73 u/kg)  Humalog 10 units three times daily as needed for BG > 300: Uses a couple of times a week What recent interventions/DTPs have been made to improve glycemic control:  None ID Have there been any recent hospitalizations or ED visits since last visit with CPP? No  I have attempted without success to contact this patient by phone three times to do his Diabetes Disease State call. I left a Voice message for patient to return my call. Mailbox is full 09/19,09/20,09/22  Adherence Review: Is the patient currently on a  STATIN medication? Yes Is the patient currently on ACE/ARB medication? Yes Does the patient have >5 day gap between last estimated fill dates? No  Anderson Malta Clinical Production designer, theatre/television/film [phone]

## 2021-07-08 ENCOUNTER — Telehealth: Payer: Self-pay | Admitting: Family Medicine

## 2021-07-08 DIAGNOSIS — K2100 Gastro-esophageal reflux disease with esophagitis, without bleeding: Secondary | ICD-10-CM

## 2021-07-08 DIAGNOSIS — K8689 Other specified diseases of pancreas: Secondary | ICD-10-CM

## 2021-07-08 DIAGNOSIS — E089 Diabetes mellitus due to underlying condition without complications: Secondary | ICD-10-CM

## 2021-07-08 MED ORDER — ONETOUCH ULTRA VI STRP: ORAL_STRIP | 0 refills | Status: DC

## 2021-07-08 MED ORDER — PANTOPRAZOLE SODIUM 40 MG PO TBEC: 40.0000 mg | DELAYED_RELEASE_TABLET | Freq: Every day | ORAL | 0 refills | Status: DC

## 2021-07-08 NOTE — Telephone Encounter (Signed)
What is the name of the medication? pantoprazole (PROTONIX) 40 MG tablet [[id number]]  and ONETOUCH ULTRA test strip [[id number]]   Have you contacted your pharmacy to request a refill? Yes he has, he is needing an appointment before they can get filled. He has an appointment scheduled for tomorrow 07/09/21.  Which pharmacy would you like this sent to? [facility], [place] - [address]  [address] [place] [address]  Phone:  [phone]  Fax:  [phone]    Patient notified that their request is being sent to the clinical staff for review and that they should receive a call once it is complete. If they do not receive a call within 72 hours they can check with their pharmacy or our office.

## 2021-07-08 NOTE — Telephone Encounter (Signed)
Rx sent  to pharmacy and patient notified [place] phone.  Dm/cma

## 2021-07-09 ENCOUNTER — Telehealth: Payer: Self-pay

## 2021-07-09 ENCOUNTER — Other Ambulatory Visit: Payer: Self-pay

## 2021-07-09 ENCOUNTER — Encounter: Payer: Self-pay | Admitting: Family Medicine

## 2021-07-09 ENCOUNTER — Ambulatory Visit (INDEPENDENT_AMBULATORY_CARE_PROVIDER_SITE_OTHER): Payer: Medicare Other | Admitting: Family Medicine

## 2021-07-09 VITALS — BP 138/86 | HR 83 | Temp 97.3°F | Ht 72.0 in | Wt 213.0 lb

## 2021-07-09 DIAGNOSIS — E089 Diabetes mellitus due to underlying condition without complications: Secondary | ICD-10-CM | POA: Diagnosis not present

## 2021-07-09 DIAGNOSIS — I5042 Chronic combined systolic (congestive) and diastolic (congestive) heart failure: Secondary | ICD-10-CM | POA: Diagnosis not present

## 2021-07-09 DIAGNOSIS — I2510 Atherosclerotic heart disease of native coronary artery without angina pectoris: Secondary | ICD-10-CM

## 2021-07-09 DIAGNOSIS — E11319 Type 2 diabetes mellitus with unspecified diabetic retinopathy without macular edema: Secondary | ICD-10-CM | POA: Insufficient documentation

## 2021-07-09 DIAGNOSIS — K8689 Other specified diseases of pancreas: Secondary | ICD-10-CM | POA: Diagnosis not present

## 2021-07-09 DIAGNOSIS — E7849 Other hyperlipidemia: Secondary | ICD-10-CM | POA: Diagnosis not present

## 2021-07-09 DIAGNOSIS — E08319 Diabetes mellitus due to underlying condition with unspecified diabetic retinopathy without macular edema: Secondary | ICD-10-CM

## 2021-07-09 DIAGNOSIS — R399 Unspecified symptoms and signs involving the genitourinary system: Secondary | ICD-10-CM

## 2021-07-09 DIAGNOSIS — I10 Essential (primary) hypertension: Secondary | ICD-10-CM | POA: Diagnosis not present

## 2021-07-09 LAB — URINALYSIS, ROUTINE W REFLEX MICROSCOPIC
Bilirubin Urine: NEGATIVE
Hgb urine dipstick: NEGATIVE
Ketones, ur: NEGATIVE
Leukocytes,Ua: NEGATIVE
Nitrite: NEGATIVE
Specific Gravity, Urine: 1.02 (ref 1.000–1.030)
Total Protein, Urine: NEGATIVE
Urine Glucose: NEGATIVE
Urobilinogen, UA: 0.2 (ref 0.0–1.0)
pH: 6 (ref 5.0–8.0)

## 2021-07-09 LAB — LIPID PANEL
Cholesterol: 89 mg/dL (ref 0–200)
HDL: 32.4 mg/dL — ABNORMAL LOW (ref >39.00)
LDL Cholesterol: 41 mg/dL (ref 0–99)
NonHDL: 56.72
Total CHOL/HDL Ratio: 3
Triglycerides: 81 mg/dL (ref 0.0–149.0)
VLDL: 16.2 mg/dL (ref 0.0–40.0)

## 2021-07-09 LAB — BASIC METABOLIC PANEL
BUN: 24 mg/dL — ABNORMAL HIGH (ref 6–23)
CO2: 31 mEq/L (ref 19–32)
Calcium: 8.9 mg/dL (ref 8.4–10.5)
Chloride: 105 mEq/L (ref 96–112)
Creatinine, Ser: 1.12 mg/dL (ref 0.40–1.50)
GFR: 65.48 mL/min (ref >60.00)
Glucose, Bld: 96 mg/dL (ref 70–99)
Potassium: 5.1 mEq/L (ref 3.5–5.1)
Sodium: 142 mEq/L (ref 135–145)

## 2021-07-09 LAB — MICROALBUMIN / CREATININE URINE RATIO
Creatinine,U: 98.7 mg/dL
Microalb Creat Ratio: 1.8 mg/g (ref 0.0–30.0)
Microalb, Ur: 1.8 mg/dL (ref 0.0–1.9)

## 2021-07-09 LAB — HEMOGLOBIN A1C: Hgb A1c MFr Bld: 9.4 % — ABNORMAL HIGH (ref 4.6–6.5)

## 2021-07-09 NOTE — Progress Notes (Signed)
[facility] [address] [place] [address] Dept: [phone] Dept Fax: [phone]  Chronic Care Office Visit  Subjective:    Patient ID: David Lane, male    DOB: 11/28/1945, 75 y.o..   MRN: [id number]  Chief Complaint  Patient presents with   Follow-up    F/u meds. Declines flu shot.    History of Present Illness:  Patient is in today for reassessment of chronic medical issues.  David Lane has a history of coronary artery disease, ischemic cardiomyopathy, and heart failure with preserved EF (EF= 55-60%, 01/2020). He is managed currently with carvedilol, aspirin, rosuvastatin, and lisinopril. He watches his blood pressure pretty closely at home and overall these have been at goal.   David Lane has a history of pancreatic insufficiency related to a prior episode of gallstone pancreatitis. He is currently prescribed Creon.   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 This has primarily been managed by Dr. Loanne Drilling. He has been noted to have labile diabetes. His blood sugar was 125 this morning, but he had a recent reading of 444 after eating a cheeseburger and fries. He is managed on Lantus and insulin aspart. He is working with CCM to help him obtain a CGM. David Lane was supposed to have returned for fasting labs after his last visit, but admits that he never did follow-up. He has a kit mailed to him by [facility], which would be for obtaining a micral and measuring an eGFR. He admits that he has not seen an eye doctor in about a year. He describes a past issue with "leaking blood vessels" in the back of his eye that were treated with laser treatments.  David Lane complains of urinary urgency, nocturia, and some dribbling. He denies any dysuria. He notes he was told he had an enlarged prostate in the past.   Past Medical History: Patient Active Problem List   Diagnosis Date  Noted   Diabetic retinopathy ([place]) 07/09/2021   Lower urinary tract symptoms (LUTS) 07/09/2021   Onychomycosis 03/17/2021   Skin pimple 03/17/2021   Coronary artery disease involving native coronary artery of native heart without angina pectoris 06/21/2019   AVM (arteriovenous malformation) of colon    History of colonic polyps    Duodenitis    Gastroesophageal reflux disease with esophagitis    Acute upper GI bleed 04/23/2019   Labile blood glucose    Functional gait disorder 01/20/2018   Acute blood loss anemia 01/20/2018   Status post transmetatarsal amputation of left foot ([place]) 01/20/2018   Status post coronary artery stent placement    Acute ST elevation myocardial infarction (STEMI) involving left anterior descending (LAD) coronary artery ([place]) 12/20/2017   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 ([place]) 12/14/2015   Pancreatic insufficiency 01/25/2013   Ischemic cardiomyopathy, EF 50% by echo 08/01/13 07/19/2012   Chronic combined systolic and diastolic CHF (congestive heart failure) ([place]) 07/14/2012   Pulmonary edema, most likely due to diastolic dysfunction 93/81/8299   CAD, 07/14/12- LAD/PDA DES    Anxiety 12/12/2009   Hyperlipidemia 04/24/2007   Essential hypertension 04/24/2007   History of pancreatitis 04/24/2007   Past Surgical History:  Procedure Laterality Date   AMPUTATION Left 01/14/2018   Procedure: TRANSMETATARSAL AMPUTATION;  Surgeon: Newt Minion, MD;  Location: [facility];  Service: Orthopedics;  Laterality: Left;   BIOPSY  04/26/2019   Procedure: BIOPSY;  Surgeon: Rush Landmark Telford Nab., MD;  Location: [facility];  Service: Gastroenterology;;   CARDIAC CATHETERIZATION  2011   minimal disease, medical management   COLONOSCOPY WITH PROPOFOL N/A 04/26/2019   Procedure: COLONOSCOPY WITH PROPOFOL;  Surgeon: Rush Landmark Telford Nab., MD;  Location: [facility];  Service: Gastroenterology;  Laterality: N/A;   CORONARY ANGIOPLASTY WITH STENT PLACEMENT   07/14/2012   successful PCI & stenting of mid LAD & PDA off the dominant CX   CORONARY/GRAFT ACUTE MI REVASCULARIZATION N/A 12/20/2017   Procedure: Coronary/Graft Acute MI Revascularization;  Surgeon: Martinique, Peter M, MD;  Location: [facility];  Service: Cardiovascular;  Laterality: N/A;   ENTEROSCOPY N/A 04/25/2019   Procedure: ENTEROSCOPY;  Surgeon: Rush Landmark Telford Nab., MD;  Location: [facility];  Service: Gastroenterology;  Laterality: N/A;   ESOPHAGOGASTRODUODENOSCOPY Left 04/24/2019   Procedure: ESOPHAGOGASTRODUODENOSCOPY (EGD);  Surgeon: Lavena Bullion, DO;  Location: [facility];  Service: Gastroenterology;  Laterality: Left;   HEMOSTASIS CLIP PLACEMENT  04/26/2019   Procedure: HEMOSTASIS CLIP PLACEMENT;  Surgeon: Irving Copas., MD;  [facility]: [facility];  Service: Gastroenterology;;   HOT HEMOSTASIS N/A 04/26/2019   Procedure: HOT HEMOSTASIS (ARGON PLASMA COAGULATION/BICAP);  Surgeon: Irving Copas., MD;  Location: [facility];  Service: Gastroenterology;  Laterality: N/A;   INCISION AND DRAINAGE ABSCESS ANAL  1970's   LEFT HEART CATH AND CORONARY ANGIOGRAPHY N/A 12/20/2017   Procedure: LEFT HEART CATH AND CORONARY ANGIOGRAPHY;  Surgeon: Martinique, Peter M, MD;  Location: [facility];  Service: Cardiovascular;  Laterality: N/A;   LEFT HEART CATHETERIZATION WITH CORONARY ANGIOGRAM N/A 07/14/2012   Procedure: LEFT HEART CATHETERIZATION WITH CORONARY ANGIOGRAM;  Surgeon: [name], MD;  Location: [facility];  Service: Cardiovascular;  Laterality: N/A;   PERCUTANEOUS CORONARY STENT INTERVENTION (PCI-S) Right 07/14/2012   Procedure: PERCUTANEOUS CORONARY STENT INTERVENTION (PCI-S);  Surgeon: [name], MD;  Location: [facility];  Service: Cardiovascular;  Laterality: Right;   POLYPECTOMY  04/26/2019   Procedure: POLYPECTOMY;  Surgeon: Mansouraty, Telford Nab., MD;  Location: [facility];  Service: Gastroenterology;;   SUBMUCOSAL TATTOO  INJECTION  04/25/2019   Procedure: SUBMUCOSAL TATTOO INJECTION;  Surgeon: Irving Copas., MD;  [facility];  Service: Gastroenterology;;   Family History  Problem Relation Age of Onset   Diabetes Mother    Diabetes Paternal Grandmother    Diabetes Maternal Grandfather    Colon cancer Neg Hx    Outpatient Medications Prior to Visit  Medication Sig Dispense Refill   aspirin 81 MG tablet Take 1 tablet (81 mg total) by mouth daily. 30 tablet    Cholecalciferol (VITAMIN D3) 50 MCG (2000 UT) TABS Take 1 tablet by mouth daily.     escitalopram (LEXAPRO) 5 MG tablet Take 1 tablet (5 mg total) by mouth daily. 90 tablet 3   glucose blood (ONETOUCH ULTRA) test strip USE 1 STRIP TO CHECK GLUCOSE 4 TIMES DAILY 150 each 0   insulin aspart (NOVOLOG) 100 UNIT/ML injection Inject 10 units into the skin up to 3 times a day if blood sugar is greater than 150. 10 mL PRN   insulin glargine (LANTUS SOLOSTAR) 100 UNIT/ML Solostar Pen Inject 68 Units into the skin every morning. And pen needles 1/day 70 mL 3   Insulin Pen Needle (B-D UF III MINI PEN NEEDLES) 31G X 5 MM MISC USE TO INJECT INSULIN DAILY 100 each 0   lipase/protease/amylase (CREON) 36000 UNITS CPEP capsule Take 2 capsules (72,000 Units total) by mouth 3 (three) times daily with meals. May also take 1 capsule (36,000 Units  total) as needed (with snacks). 240 capsule 11   lisinopril (ZESTRIL) 5 MG tablet Take 1 tablet (5 mg total) by mouth daily. 90 tablet 3   metoprolol succinate (TOPROL-XL) 25 MG 24 hr tablet Take 1 tablet (25 mg total) by mouth daily. 90 tablet 3   Multiple Vitamins-Minerals (CENTRUM SILVER ULTRA MENS PO) Take 1 tablet by mouth daily.      pantoprazole (PROTONIX) 40 MG tablet Take 1 tablet (40 mg total) by mouth daily. 90 tablet 0   rosuvastatin (CRESTOR) 40 MG tablet TAKE 1 TABLET(40 MG) BY MOUTH AT BEDTIME 30 tablet 6   No facility-administered medications prior to visit.   Allergies  Allergen Reactions    Lipitor [Atorvastatin] Other (See Comments)    Muscle weakness   Metformin And Related Diarrhea    Severe diarrhea    Objective:   Today's Vitals   07/09/21 0800  BP: 138/86  Pulse: 83  Temp: (!) 97.3 F (36.3 C)  TempSrc: Temporal  SpO2: 98%  Weight: 213 lb (96.6 kg)  Height: 6' (1.829 m)   Body mass index is 28.89 kg/m.   General: Well developed, well nourished. No acute distress. Psych: Alert and oriented. Normal mood and affect.  Health Maintenance Due  Topic Date Due   COVID-19 Vaccine (1) Never done   OPHTHALMOLOGY EXAM  Never done   Hepatitis C Screening  Never done   Zoster Vaccines- Shingrix (1 of 2) Never done   HEMOGLOBIN A1C  05/20/2021     Lab Results: Lab Results  Component Value Date   HGBA1C 8.2 (A) 11/20/2020   Lab Results  Component Value Date   CHOL 102 12/21/2017   HDL 24 (L) 12/21/2017   LDLCALC 64 12/21/2017   TRIG 70 12/21/2017   CHOLHDL 4.3 12/21/2017   Assessment & Plan:   1. Essential hypertension Home blood pressures are at goal. I will continue him on lisinopril.  2. Chronic combined systolic and diastolic CHF (congestive heart failure) ([place]) Compensated. Continue metoprolol.  3. Coronary artery disease involving native coronary artery of native heart without angina pectoris Continue medical management for hyperlipidemia and daily aspirin.  4. 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 [facility]) Patient is due for DM labs. I will have specimens obtained today for a UA, HbA1c, BMP, micral, and lipids.  5. Other hyperlipidemia As above.  6. Diabetic retinopathy associated with diabetes mellitus due to underlying condition, macular edema presence unspecified, unspecified laterality, unspecified retinopathy severity ([place]) I strongly advised Mr. Damiano to follow-up with his ophthalmologist.  7. Lower urinary tract symptoms (LUTS) We discussed how his symptoms likely represent issues with his prostate. We are reassessing  his diabetes to make sure his nocturia is not from poor diabetes control. He prefers to not start a medication at this time, but notes he will let me know if he decides to try this.  Haydee Salter, MD

## 2021-07-09 NOTE — Progress Notes (Signed)
Chronic Care Management Pharmacy Assistant   Name: David Lane  MRN: [id number] DOB: [birth year]  Reason for Encounter:Diabetes Disease State Call.  Recent office visits:  07/09/2021 Dr.Rudd MD (PCP) No medication Changes noted  Recent consult visits:  No Recent [facility] visits:  None in previous 6 months  Medications: Outpatient Encounter Medications as of 07/09/2021  Medication Sig   aspirin 81 MG tablet Take 1 tablet (81 mg total) by mouth daily.   Cholecalciferol (VITAMIN D3) 50 MCG (2000 UT) TABS Take 1 tablet by mouth daily.   escitalopram (LEXAPRO) 5 MG tablet Take 1 tablet (5 mg total) by mouth daily.   glucose blood (ONETOUCH ULTRA) test strip USE 1 STRIP TO CHECK GLUCOSE 4 TIMES DAILY   insulin aspart (NOVOLOG) 100 UNIT/ML injection Inject 10 units into the skin up to 3 times a day if blood sugar is greater than 150.   insulin glargine (LANTUS SOLOSTAR) 100 UNIT/ML Solostar Pen Inject 68 Units into the skin every morning. And pen needles 1/day   Insulin Pen Needle (B-D UF III MINI PEN NEEDLES) 31G X 5 MM MISC USE TO INJECT INSULIN DAILY   lipase/protease/amylase (CREON) 36000 UNITS CPEP capsule Take 2 capsules (72,000 Units total) by mouth 3 (three) times daily with meals. May also take 1 capsule (36,000 Units total) as needed (with snacks).   lisinopril (ZESTRIL) 5 MG tablet Take 1 tablet (5 mg total) by mouth daily.   metoprolol succinate (TOPROL-XL) 25 MG 24 hr tablet Take 1 tablet (25 mg total) by mouth daily.   Multiple Vitamins-Minerals (CENTRUM SILVER ULTRA MENS PO) Take 1 tablet by mouth daily.    pantoprazole (PROTONIX) 40 MG tablet Take 1 tablet (40 mg total) by mouth daily.   rosuvastatin (CRESTOR) 40 MG tablet TAKE 1 TABLET(40 MG) BY MOUTH AT BEDTIME   No facility-administered encounter medications on file as of 07/09/2021.     Care Gaps: [id number] Vaccine Ophthalmology Exam Hepatitis C Screening Shingrix Vaccine   Star Rating  Drug: Rosuvastatin 40 mg last filled on 07/01/2021 for 7 day supply at [facility]. Lisinopril 10 mg last filled on 03/17/2021 for 90 day supply at [facility] Medication Fill Gaps: None ID   Recent Relevant Labs: Lab Results  Component Value Date/Time   HGBA1C 8.2 (A) 11/20/2020 02:24 PM   HGBA1C 8.8 (A) 08/14/2020 09:51 AM   HGBA1C 10.0 (H) 12/21/2017 03:45 AM   HGBA1C 10.0 (H) 12/20/2017 03:01 PM   MICROALBUR 1.5 11/25/2014 01:39 PM   MICROALBUR 8.2 (H) 01/24/2013 04:40 PM    Kidney Function Lab Results  Component Value Date/Time   CREATININE 1.25 (H) 08/21/2019 05:14 PM   CREATININE 0.93 08/09/2019 03:11 PM   CREATININE 0.78 09/14/2013 08:20 AM   GFR 69.70 05/08/2019 03:33 PM   GFRNONAA 58 (L) 08/21/2019 05:14 PM   GFRAA >60 08/21/2019 05:14 PM    Current antihyperglycemic regimen:  Lantus 68 units daily (0.73 u/kg)  Humalog 10 units three times daily as needed for BG > 300: Uses a couple of times a week What recent interventions/DTPs have been made to improve glycemic control:  None ID Have there been any recent hospitalizations or ED visits since last visit with CPP? No  I have attempted without success to contact this patient by phone three times to do his Diabetes Disease State call.No Voice Mailbox 10/13,10/17,10/19  Adherence Review: Is the patient currently on a STATIN medication? No Is the patient currently on ACE/ARB medication? Yes Does  the patient have >5 day gap between last estimated fill dates? No   [place] Clinical Production designer, theatre/television/film [phone]

## 2021-07-13 ENCOUNTER — Other Ambulatory Visit: Payer: Self-pay | Admitting: Family Medicine

## 2021-07-13 DIAGNOSIS — E7849 Other hyperlipidemia: Secondary | ICD-10-CM

## 2021-07-15 ENCOUNTER — Telehealth: Payer: Self-pay

## 2021-07-15 ENCOUNTER — Encounter: Payer: Self-pay | Admitting: Family Medicine

## 2021-07-15 NOTE — Telephone Encounter (Signed)
Patient notified VIA phone and he will call to get an appointment with Dr Loanne Drilling.  Dm/cma

## 2021-08-10 ENCOUNTER — Other Ambulatory Visit: Payer: Self-pay | Admitting: Family Medicine

## 2021-08-10 DIAGNOSIS — E7849 Other hyperlipidemia: Secondary | ICD-10-CM

## 2021-08-12 ENCOUNTER — Telehealth: Payer: Self-pay

## 2021-08-12 NOTE — Progress Notes (Signed)
Chronic Care Management Pharmacy Assistant   Name: David Lane  MRN: [id number] DOB: [birth year]  Reason for Encounter:Diabetes Disease State Call.   Recent office visits:  No recent office visit  Recent consult visits:  No recent [facility] visits:  None in previous 6 months  Medications: Outpatient Encounter Medications as of 08/12/2021  Medication Sig   aspirin 81 MG tablet Take 1 tablet (81 mg total) by mouth daily.   Cholecalciferol (VITAMIN D3) 50 MCG (2000 UT) TABS Take 1 tablet by mouth daily.   escitalopram (LEXAPRO) 5 MG tablet Take 1 tablet (5 mg total) by mouth daily.   glucose blood (ONETOUCH ULTRA) test strip USE 1 STRIP TO CHECK GLUCOSE 4 TIMES DAILY   insulin aspart (NOVOLOG) 100 UNIT/ML injection Inject 10 units into the skin up to 3 times a day if blood sugar is greater than 150.   insulin glargine (LANTUS SOLOSTAR) 100 UNIT/ML Solostar Pen Inject 68 Units into the skin every morning. And pen needles 1/day   Insulin Pen Needle (B-D UF III MINI PEN NEEDLES) 31G X 5 MM MISC USE TO INJECT INSULIN DAILY   lipase/protease/amylase (CREON) 36000 UNITS CPEP capsule Take 2 capsules (72,000 Units total) by mouth 3 (three) times daily with meals. May also take 1 capsule (36,000 Units total) as needed (with snacks).   lisinopril (ZESTRIL) 5 MG tablet Take 1 tablet (5 mg total) by mouth daily.   metoprolol succinate (TOPROL-XL) 25 MG 24 hr tablet Take 1 tablet (25 mg total) by mouth daily.   Multiple Vitamins-Minerals (CENTRUM SILVER ULTRA MENS PO) Take 1 tablet by mouth daily.    pantoprazole (PROTONIX) 40 MG tablet Take 1 tablet (40 mg total) by mouth daily.   rosuvastatin (CRESTOR) 40 MG tablet TAKE 1 TABLET(40 MG) BY MOUTH AT BEDTIME   No facility-administered encounter medications on file as of 08/12/2021.   Care Gaps: COVID-19 Vaccine Ophthalmology Exam Hepatitis C Screening Shingrix Vaccine Pneumonia Vaccine   Star Rating Drug: Rosuvastatin  40 mg last filled on 07/14/2021 for 90 day supply at [facility]. Lisinopril 10 mg last filled on 03/17/2021 for 90 day supply at [facility] Medication Fill Gaps: None ID   Recent Relevant Labs: Lab Results  Component Value Date/Time   HGBA1C 9.4 (H) 07/09/2021 08:51 AM   HGBA1C 8.2 (A) 11/20/2020 02:24 PM   HGBA1C 8.8 (A) 08/14/2020 09:51 AM   HGBA1C 10.0 (H) 12/21/2017 03:45 AM   MICROALBUR 1.8 07/09/2021 08:51 AM   MICROALBUR 1.5 11/25/2014 01:39 PM    Kidney Function Lab Results  Component Value Date/Time   CREATININE 1.12 07/09/2021 08:51 AM   CREATININE 1.25 (H) 08/21/2019 05:14 PM   CREATININE 0.78 09/14/2013 08:20 AM   GFR 65.48 07/09/2021 08:51 AM   GFRNONAA 58 (L) 08/21/2019 05:14 PM   GFRAA >60 08/21/2019 05:14 PM    Current antihyperglycemic regimen:  Lantus 68 units daily (0.73 u/kg)  Humalog 10 units three times daily as needed for BG > 300: Uses a couple of times a week  Have there been any recent hospitalizations or ED visits since last visit with CPP? No  I have attempted without success to contact this patient by phone three times to do his Diabetes Disease State call. I left a Voice message for patient to return my call.  Adherence Review: Is the patient currently on a STATIN medication? Yes Is the patient currently on ACE/ARB medication? Yes Does the patient have >5 day gap between last  estimated fill dates? Yes   [place] Pharmacist Assistant [phone]

## 2021-10-09 ENCOUNTER — Ambulatory Visit (INDEPENDENT_AMBULATORY_CARE_PROVIDER_SITE_OTHER): Payer: Medicare Other | Admitting: Family Medicine

## 2021-10-09 ENCOUNTER — Encounter: Payer: Self-pay | Admitting: Family Medicine

## 2021-10-09 ENCOUNTER — Other Ambulatory Visit: Payer: Self-pay

## 2021-10-09 VITALS — BP 132/86 | HR 97 | Temp 97.6°F | Wt 216.2 lb

## 2021-10-09 DIAGNOSIS — B351 Tinea unguium: Secondary | ICD-10-CM

## 2021-10-09 DIAGNOSIS — E7849 Other hyperlipidemia: Secondary | ICD-10-CM | POA: Diagnosis not present

## 2021-10-09 DIAGNOSIS — K8689 Other specified diseases of pancreas: Secondary | ICD-10-CM

## 2021-10-09 DIAGNOSIS — I5042 Chronic combined systolic (congestive) and diastolic (congestive) heart failure: Secondary | ICD-10-CM | POA: Diagnosis not present

## 2021-10-09 DIAGNOSIS — Z89432 Acquired absence of left foot: Secondary | ICD-10-CM

## 2021-10-09 DIAGNOSIS — E089 Diabetes mellitus due to underlying condition without complications: Secondary | ICD-10-CM | POA: Diagnosis not present

## 2021-10-09 DIAGNOSIS — I10 Essential (primary) hypertension: Secondary | ICD-10-CM | POA: Diagnosis not present

## 2021-10-09 LAB — GLUCOSE, RANDOM: Glucose, Bld: 230 mg/dL — ABNORMAL HIGH (ref 70–99)

## 2021-10-09 LAB — HEMOGLOBIN A1C: Hgb A1c MFr Bld: 8.8 % — ABNORMAL HIGH (ref 4.6–6.5)

## 2021-10-09 NOTE — Progress Notes (Signed)
[facility] [address] [address] Dept: [phone] Dept Fax: [phone]  Chronic Care Office Visit  Subjective:    Patient ID: David Lane, male    DOB: [birth year], 76 y.o..   MRN: [id number]  Chief Complaint  Patient presents with   Follow-up    3 month f/u.  No concerns.   Declines flu shot   (BP at home 110/70, pulse 84)    History of Present Illness:  Patient is in today for reassessment of chronic medical issues.  David Lane has a history of coronary artery disease, ischemic cardiomyopathy, and heart failure with preserved EF (EF= 55-60%, 01/2020). He is managed currently with carvedilol, aspirin, rosuvastatin, and lisinopril. He watches his blood pressure pretty closely at home and overall these have been at goal. He notes his blood pressure is consistently higher when seen at any doctor's office.   David Lane has a history of pancreatic insufficiency related to a prior episode of gallstone pancreatitis. He is currently prescribed Creon.   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 This has primarily been managed by Dr. Loanne Lane. He has been noted to have labile diabetes. He has a history of complicaitons related to diabetic retinopathy. Although advised to see his eye doctor, he has not followed through with this. He has had sugars ranging from the 60-upper 300s. He typically takes 10-15 units of Novalog in response to this. He has not followed up with Dr. Loanne Lane as requested at his last visit.   David Lane notes issues with his right toe nails. These are quite thickened. He has had a prior transmetatarsal amputation. He notes he has some callus developed on the bottom of his feet.  Past Medical History: Patient Active Problem List   Diagnosis Date Noted   Diabetic retinopathy ([place]) 07/09/2021   Lower urinary tract symptoms (LUTS) 07/09/2021   Onychomycosis 03/17/2021    Skin pimple 03/17/2021   Coronary artery disease involving native coronary artery of native heart without angina pectoris 06/21/2019   AVM (arteriovenous malformation) of colon    History of colonic polyps    Duodenitis    Gastroesophageal reflux disease with esophagitis    Acute upper GI bleed 04/23/2019   Labile blood glucose    Functional gait disorder 01/20/2018   Acute blood loss anemia 01/20/2018   Status post transmetatarsal amputation of left foot ([place]) 01/20/2018   Status post coronary artery stent placement    Acute ST elevation myocardial infarction (STEMI) involving left anterior descending (LAD) coronary artery ([place]) 12/20/2017   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 ([place]) 12/14/2015   Pancreatic insufficiency 01/25/2013   Ischemic cardiomyopathy, EF 50% by echo 08/01/13 07/19/2012   Chronic combined systolic and diastolic CHF (congestive heart failure) ([place]) 07/14/2012   Pulmonary edema, most likely due to diastolic dysfunction 01/74/9449   CAD, 07/14/12- LAD/PDA DES    Anxiety 12/12/2009   Hyperlipidemia 04/24/2007   Essential hypertension 04/24/2007   History of pancreatitis 04/24/2007   Past Surgical History:  Procedure Laterality Date   AMPUTATION Left 01/14/2018   Procedure: TRANSMETATARSAL AMPUTATION;  Surgeon: Newt Minion, MD;  Location: [facility];  Service: Orthopedics;  Laterality: Left;   BIOPSY  04/26/2019   Procedure: BIOPSY;  Surgeon: Rush Landmark Telford Nab., MD;  Location: [facility];  Service: Gastroenterology;;   CARDIAC CATHETERIZATION  2011   minimal disease, medical management   COLONOSCOPY WITH PROPOFOL N/A 04/26/2019   Procedure: COLONOSCOPY  WITH PROPOFOL;  Surgeon: Mansouraty, Telford Nab., MD;  Location: [facility];  Service: Gastroenterology;  Laterality: N/A;   CORONARY ANGIOPLASTY WITH STENT PLACEMENT  07/14/2012   successful PCI & stenting of mid LAD & PDA off the dominant CX   CORONARY/GRAFT ACUTE MI REVASCULARIZATION  N/A 12/20/2017   Procedure: Coronary/Graft Acute MI Revascularization;  Surgeon: Martinique, Peter M, MD;  Location: [facility];  Service: Cardiovascular;  Laterality: N/A;   ENTEROSCOPY N/A 04/25/2019   Procedure: ENTEROSCOPY;  Surgeon: Rush Landmark Telford Nab., MD;  [facility];  Service: Gastroenterology;  Laterality: N/A;   ESOPHAGOGASTRODUODENOSCOPY Left 04/24/2019   Procedure: ESOPHAGOGASTRODUODENOSCOPY (EGD);  Surgeon: Lavena Bullion, DO;  [facility];  Service: Gastroenterology;  Laterality: Left;   HEMOSTASIS CLIP PLACEMENT  04/26/2019   Procedure: HEMOSTASIS CLIP PLACEMENT;  Surgeon: Irving Copas., MD;  [facility];  Service: Gastroenterology;;   HOT HEMOSTASIS N/A 04/26/2019   Procedure: HOT HEMOSTASIS (ARGON PLASMA COAGULATION/BICAP);  Surgeon: Irving Copas., MD;  Location: [facility];  Service: Gastroenterology;  Laterality: N/A;   INCISION AND DRAINAGE ABSCESS ANAL  1970's   LEFT HEART CATH AND CORONARY ANGIOGRAPHY N/A 12/20/2017   Procedure: LEFT HEART CATH AND CORONARY ANGIOGRAPHY;  Surgeon: Martinique, Peter M, MD;  Location: [facility];  Service: Cardiovascular;  Laterality: N/A;   LEFT HEART CATHETERIZATION WITH CORONARY ANGIOGRAM N/A 07/14/2012   Procedure: LEFT HEART CATHETERIZATION WITH CORONARY ANGIOGRAM;  Surgeon: [name], MD;  Location: [facility];  Service: Cardiovascular;  Laterality: N/A;   PERCUTANEOUS CORONARY STENT INTERVENTION (PCI-S) Right 07/14/2012   Procedure: PERCUTANEOUS CORONARY STENT INTERVENTION (PCI-S);  Surgeon: [name], MD;  Location: [facility];  Service: Cardiovascular;  Laterality: Right;   POLYPECTOMY  04/26/2019   Procedure: POLYPECTOMY;  Surgeon: Mansouraty, Telford Nab., MD;  Location: [facility];  Service: Gastroenterology;;   SUBMUCOSAL TATTOO INJECTION  04/25/2019   Procedure: SUBMUCOSAL TATTOO INJECTION;  Surgeon: Irving Copas., MD;  [facility];   Service: Gastroenterology;;   Family History  Problem Relation Age of Onset   Diabetes Mother    Diabetes Paternal Grandmother    Diabetes Maternal Grandfather    Colon cancer Neg Hx    Outpatient Medications Prior to Visit  Medication Sig Dispense Refill   aspirin 81 MG tablet Take 1 tablet (81 mg total) by mouth daily. 30 tablet    Cholecalciferol (VITAMIN D3) 50 MCG (2000 UT) TABS Take 1 tablet by mouth daily.     escitalopram (LEXAPRO) 5 MG tablet Take 1 tablet (5 mg total) by mouth daily. 90 tablet 3   glucose blood (ONETOUCH ULTRA) test strip USE 1 STRIP TO CHECK GLUCOSE 4 TIMES DAILY 150 each 0   insulin aspart (NOVOLOG) 100 UNIT/ML injection Inject 10 units into the skin up to 3 times a day if blood sugar is greater than 150. 10 mL PRN   insulin glargine (LANTUS SOLOSTAR) 100 UNIT/ML Solostar Pen Inject 68 Units into the skin every morning. And pen needles 1/day 70 mL 3   Insulin Pen Needle (B-D UF III MINI PEN NEEDLES) 31G X 5 MM MISC USE TO INJECT INSULIN DAILY 100 each 0   lipase/protease/amylase (CREON) 36000 UNITS CPEP capsule Take 2 capsules (72,000 Units total) by mouth 3 (three) times daily with meals. May also take 1 capsule (36,000 Units total) as needed (with snacks). 240 capsule 11   lisinopril (ZESTRIL) 5 MG tablet Take 1 tablet (5 mg total) by mouth daily. [place]  tablet 3   metoprolol succinate (TOPROL-XL) 25 MG 24 hr tablet Take 1 tablet (25 mg total) by mouth daily. 90 tablet 3   Multiple Vitamins-Minerals (CENTRUM SILVER ULTRA MENS PO) Take 1 tablet by mouth daily.      rosuvastatin (CRESTOR) 40 MG tablet TAKE 1 TABLET(40 MG) BY MOUTH AT BEDTIME 30 tablet 6   pantoprazole (PROTONIX) 40 MG tablet Take 1 tablet (40 mg total) by mouth daily. 90 tablet 0   No facility-administered medications prior to visit.    Allergies  Allergen Reactions   Lipitor [Atorvastatin] Other (See Comments)    Muscle weakness   Metformin And Related Diarrhea    Severe diarrhea      Objective:   Today's Vitals   10/09/21 0929  BP: 132/86  Pulse: 97  Temp: 97.6 F (36.4 C)  TempSrc: Temporal  SpO2: 98%  Weight: 216 lb 3.2 oz (98.1 kg)   Body mass index is 29.32 kg/m.   General: Well developed, well nourished. No acute distress. Feet: The left foot has a well healed amputation site. There is a small area of callus near the medial aspect of the sole near the end of the stump. The right foot shows yellow discoloration and thickening of most nails. The great toe nail is very thickened and malformed. Psych: Alert and oriented x3. Normal mood and affect.  Health Maintenance Due  Topic Date Due   COVID-19 Vaccine (1) Never done   OPHTHALMOLOGY EXAM  Never done   Hepatitis C Screening  Never done   Zoster Vaccines- Shingrix (1 of 2) Never done   Pneumonia Vaccine 34+ Years old (2 - PPSV23 if available, else PCV20) 12/05/2015   Lab Results Last metabolic panel Lab Results  Component Value Date   GLUCOSE 230 (H) 10/09/2021   NA 142 07/09/2021   K 5.1 07/09/2021   CL 105 07/09/2021   CO2 31 07/09/2021   BUN 24 (H) 07/09/2021   CREATININE 1.12 07/09/2021   GFRNONAA 58 (L) 08/21/2019   CALCIUM 8.9 07/09/2021   PROT 7.5 08/21/2019   ALBUMIN 3.8 08/21/2019   BILITOT 0.8 08/21/2019   ALKPHOS 98 08/21/2019   AST 28 08/21/2019   ALT 47 (H) 08/21/2019   ANIONGAP 9 08/21/2019   Last lipids Lab Results  Component Value Date   CHOL 89 07/09/2021   HDL 32.40 (L) 07/09/2021   LDLCALC 41 07/09/2021   TRIG 81.0 07/09/2021   CHOLHDL 3 07/09/2021   Last hemoglobin A1c Lab Results  Component Value Date   HGBA1C 8.8 (H) 10/09/2021     Assessment & Plan:   1. 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 [facility]) David Lane has labile diabetes. His last A1c was elevated, however he has not followed through with seeing Dr. Loanne Lane (endocrinology). I emphasized the importance of this to try and improve his diabetes management. I also strongly advised that  he see his eye doctor to preserve his vision.  - Glucose, random - Hemoglobin A1c  2. Essential hypertension Blood pressure is at goal. Continue lisinopril and metoprolol.  3. Chronic combined systolic and diastolic CHF (congestive heart failure) ([place]) Compensated. Continue lisinopril and metoprolol.  4. Other hyperlipidemia Lipoids at goal on rosuvastatin.  5. Status post transmetatarsal amputation of left foot ([place]) 6. Onychomycosis I will refer David Lane to podiatry for ongoing care related to his diabetic foot issues. I suspect he may need the right 1st nail removed and a treatment plan for onychomycosis.  - Ambulatory referral to  Podiatry  Haydee Salter, MD

## 2021-10-27 ENCOUNTER — Other Ambulatory Visit: Payer: Self-pay | Admitting: Family Medicine

## 2021-10-27 DIAGNOSIS — K2100 Gastro-esophageal reflux disease with esophagitis, without bleeding: Secondary | ICD-10-CM

## 2021-10-28 ENCOUNTER — Other Ambulatory Visit: Payer: Self-pay

## 2021-10-28 DIAGNOSIS — E089 Diabetes mellitus due to underlying condition without complications: Secondary | ICD-10-CM

## 2021-10-28 DIAGNOSIS — K8689 Other specified diseases of pancreas: Secondary | ICD-10-CM

## 2021-10-28 MED ORDER — BD PEN NEEDLE MINI U/F 31G X 5 MM MISC: 1 refills | Status: DC

## 2021-11-06 ENCOUNTER — Other Ambulatory Visit: Payer: Self-pay | Admitting: Family Medicine

## 2021-11-06 DIAGNOSIS — E089 Diabetes mellitus due to underlying condition without complications: Secondary | ICD-10-CM

## 2021-11-06 DIAGNOSIS — K8689 Other specified diseases of pancreas: Secondary | ICD-10-CM

## 2021-12-30 ENCOUNTER — Telehealth: Payer: Self-pay | Admitting: Family Medicine

## 2021-12-30 DIAGNOSIS — E089 Diabetes mellitus due to underlying condition without complications: Secondary | ICD-10-CM

## 2021-12-30 MED ORDER — LANTUS SOLOSTAR 100 UNIT/ML ~~LOC~~ SOPN: 68.0000 [IU] | PEN_INJECTOR | SUBCUTANEOUS | 1 refills | Status: DC

## 2021-12-30 NOTE — Telephone Encounter (Signed)
Rx sent to the pharmacy and patient notified VIA phone.  Dm/cma  

## 2021-12-30 NOTE — Telephone Encounter (Signed)
Caller Name: Alonza Knisley ?Call back phone #: [phone] ? ?MEDICATION(S): Lantus 100 unit pen ? ? ?Days of Med Remaining: Pt on last pen ? ?Has the patient contacted their pharmacy (YES/NO)?  Pt called to check on when his appt was and said he was having a hard time getting in touch with Dr Rosario Adie office and wanted to know if Dr Gena Fray could fill him some for now ?IF YES, when and what did the pharmacy advise?  ?IF NO, request that the patient contact the pharmacy for the refills in the future.  ?           The pharmacy will send an electronic request (except for controlled medications). ? ?Preferred Pharmacy: [facility], [place]. Phone: [phone] ? ?~~~Please advise patient/caregiver to allow 2-3 business days to process RX refills.  ?

## 2021-12-31 ENCOUNTER — Other Ambulatory Visit: Payer: Self-pay | Admitting: Family Medicine

## 2021-12-31 DIAGNOSIS — F419 Anxiety disorder, unspecified: Secondary | ICD-10-CM

## 2022-01-07 ENCOUNTER — Ambulatory Visit (INDEPENDENT_AMBULATORY_CARE_PROVIDER_SITE_OTHER): Payer: Medicare Other | Admitting: Family Medicine

## 2022-01-07 ENCOUNTER — Encounter: Payer: Self-pay | Admitting: Family Medicine

## 2022-01-07 VITALS — BP 126/70 | HR 82 | Temp 97.5°F | Ht 72.0 in | Wt 216.0 lb

## 2022-01-07 DIAGNOSIS — B351 Tinea unguium: Secondary | ICD-10-CM

## 2022-01-07 DIAGNOSIS — I10 Essential (primary) hypertension: Secondary | ICD-10-CM | POA: Diagnosis not present

## 2022-01-07 DIAGNOSIS — K8689 Other specified diseases of pancreas: Secondary | ICD-10-CM | POA: Diagnosis not present

## 2022-01-07 DIAGNOSIS — Z89432 Acquired absence of left foot: Secondary | ICD-10-CM

## 2022-01-07 DIAGNOSIS — E089 Diabetes mellitus due to underlying condition without complications: Secondary | ICD-10-CM | POA: Diagnosis not present

## 2022-01-07 DIAGNOSIS — E7849 Other hyperlipidemia: Secondary | ICD-10-CM

## 2022-01-07 DIAGNOSIS — I5042 Chronic combined systolic (congestive) and diastolic (congestive) heart failure: Secondary | ICD-10-CM | POA: Diagnosis not present

## 2022-01-07 LAB — GLUCOSE, RANDOM: Glucose, Bld: 87 mg/dL (ref 70–99)

## 2022-01-07 LAB — HEMOGLOBIN A1C: Hgb A1c MFr Bld: 8.9 % — ABNORMAL HIGH (ref 4.6–6.5)

## 2022-01-07 MED ORDER — LANTUS SOLOSTAR 100 UNIT/ML ~~LOC~~ SOPN: 70.0000 [IU] | PEN_INJECTOR | SUBCUTANEOUS | 2 refills | Status: DC

## 2022-01-07 NOTE — Progress Notes (Signed)
?[facility] ?[place] ?[place] [address] ?Dept: [phone] ?Dept Fax: [phone] ? ?Chronic Care Office Visit ? ?Subjective:  ? ? Patient ID: David Lane, male    DOB: July 29, 1946, 76 y.o..   MRN: [id number] ? ?Chief Complaint  ?Patient presents with  ? Follow-up  ?  3 month f/u .   Fasting today.    ? ? ?History of Present Illness: ? ?Patient is in today for reassessment of chronic medical issues. ? ?David Lane has a history of coronary artery disease, ischemic cardiomyopathy, and heart failure with preserved EF (EF= 55-60%, 01/2020). He is managed currently with carvedilol, aspirin, rosuvastatin, and lisinopril. He watches his blood pressure pretty closely at home and overall these have been at goal. He has an occasional high blood pressure that responds to lying down. ?  ?David Lane has a history of pancreatic insufficiency related to a prior episode of gallstone pancreatitis. He is currently prescribed Creon. ?  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 This has primarily been managed by Dr. Loanne Drilling, who is stopping practice. He has been noted to have labile diabetes. He has a history of complications related to diabetic retinopathy. Although advised to see his eye doctor, he has not followed through with this. He has had sugars ranging from the 60-upper 300s. He typically takes 10-15 units of Novalog in response to this. He states he has had 2 low blood sugars int he past week. He also has had 2-3 days where he needed to take his lispro insulin due to sugars being above 300. He states that he often has symptoms when his blood sugars drop below 120. He will respond by taking glucose tabs. He also notes that his sugars drop low when he engages in heavier activity, such as mowing his yard. ?  ?David Lane notes issues with his right toe nails. These are quite thickened. He has had a prior transmetatarsal  amputation. He has an appointment pending with podiatry. ? ?Past Medical History: ?Patient Active Problem List  ? Diagnosis Date Noted  ? Diabetic retinopathy ([place]) 07/09/2021  ? Lower urinary tract symptoms (LUTS) 07/09/2021  ? Onychomycosis 03/17/2021  ? Skin pimple 03/17/2021  ? Coronary artery disease involving native coronary artery of native heart without angina pectoris 06/21/2019  ? AVM (arteriovenous malformation) of colon   ? History of colonic polyps   ? Duodenitis   ? Gastroesophageal reflux disease with esophagitis   ? Acute upper GI bleed 04/23/2019  ? Labile blood glucose   ? Functional gait disorder 01/20/2018  ? Acute blood loss anemia 01/20/2018  ? Status post transmetatarsal amputation of left foot ([place]) 01/20/2018  ? Status post coronary artery stent placement   ? Acute ST elevation myocardial infarction (STEMI) involving left anterior descending (LAD) coronary artery ([place]) 12/20/2017  ? 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 ([place]) 12/14/2015  ? Pancreatic insufficiency 01/25/2013  ? Ischemic cardiomyopathy, EF 50% by echo 08/01/13 07/19/2012  ? Chronic combined systolic and diastolic CHF (congestive heart failure) ([place]) 07/14/2012  ? Pulmonary edema, most likely due to diastolic dysfunction 62/37/6283  ? CAD, 07/14/12- LAD/PDA DES   ? Anxiety 12/12/2009  ? Hyperlipidemia 04/24/2007  ? Essential hypertension 04/24/2007  ? History of pancreatitis 04/24/2007  ? ?Past Surgical History:  ?Procedure Laterality Date  ? AMPUTATION Left 01/14/2018  ? Procedure: TRANSMETATARSAL AMPUTATION;  Surgeon: Newt Minion, MD;  Location: [facility];  Service: Orthopedics;  Laterality:  Left;  ? BIOPSY  04/26/2019  ? Procedure: BIOPSY;  Surgeon: Irving Copas., MD;  Location: [facility];  Service: Gastroenterology;;  ? CARDIAC CATHETERIZATION  2011  ? minimal disease, medical management  ? COLONOSCOPY WITH PROPOFOL N/A 04/26/2019  ? Procedure: COLONOSCOPY WITH PROPOFOL;  Surgeon: Mansouraty,  Telford Nab., MD;  Location: [facility];  Service: Gastroenterology;  Laterality: N/A;  ? CORONARY ANGIOPLASTY WITH STENT PLACEMENT  07/14/2012  ? successful PCI & stenting of mid LAD & PDA off the dominant CX  ? CORONARY/GRAFT ACUTE MI REVASCULARIZATION N/A 12/20/2017  ? Procedure: Coronary/Graft Acute MI Revascularization;  Surgeon: Martinique, Peter M, MD;  Location: [facility];  Service: Cardiovascular;  Laterality: N/A;  ? ENTEROSCOPY N/A 04/25/2019  ? Procedure: ENTEROSCOPY;  Surgeon: Rush Landmark Telford Nab., MD;  Location: [facility];  Service: Gastroenterology;  Laterality: N/A;  ? ESOPHAGOGASTRODUODENOSCOPY Left 04/24/2019  ? Procedure: ESOPHAGOGASTRODUODENOSCOPY (EGD);  Surgeon: Lavena Bullion, DO;  [facility]: [facility];  Service: Gastroenterology;  Laterality: Left;  ? HEMOSTASIS CLIP PLACEMENT  04/26/2019  ? Procedure: HEMOSTASIS CLIP PLACEMENT;  Surgeon: Irving Copas., MD;  Location: [facility];  Service: Gastroenterology;;  ? HOT HEMOSTASIS N/A 04/26/2019  ? Procedure: HOT HEMOSTASIS (ARGON PLASMA COAGULATION/BICAP);  Surgeon: Irving Copas., MD;  Location: [facility];  Service: Gastroenterology;  Laterality: N/A;  ? INCISION AND DRAINAGE ABSCESS ANAL  1970's  ? LEFT HEART CATH AND CORONARY ANGIOGRAPHY N/A 12/20/2017  ? Procedure: LEFT HEART CATH AND CORONARY ANGIOGRAPHY;  Surgeon: Martinique, Peter M, MD;  Location: [facility];  Service: Cardiovascular;  Laterality: N/A;  ? LEFT HEART CATHETERIZATION WITH CORONARY ANGIOGRAM N/A 07/14/2012  ? Procedure: LEFT HEART CATHETERIZATION WITH CORONARY ANGIOGRAM;  Surgeon: [name], MD;  Location: [facility];  Service: Cardiovascular;  Laterality: N/A;  ? PERCUTANEOUS CORONARY STENT INTERVENTION (PCI-S) Right 07/14/2012  ? Procedure: PERCUTANEOUS CORONARY STENT INTERVENTION (PCI-S);  Surgeon: [name], MD;  Location: [facility];  Service: Cardiovascular;  Laterality: Right;  ? POLYPECTOMY  04/26/2019  ? Procedure:  POLYPECTOMY;  Surgeon: Rush Landmark Telford Nab., MD;  Location: [facility];  Service: Gastroenterology;;  ? SUBMUCOSAL TATTOO INJECTION  04/25/2019  ? Procedure: SUBMUCOSAL TATTOO INJECTION;  Surgeon: Irving Copas., MD;  Location: [facility];  Service: Gastroenterology;;  ? ?Family History  ?Problem Relation Age of Onset  ? Diabetes Mother   ? Diabetes Paternal Grandmother   ? Diabetes Maternal Grandfather   ? Colon cancer Neg Hx   ? ?Outpatient Medications Prior to Visit  ?Medication Sig Dispense Refill  ? aspirin 81 MG tablet Take 1 tablet (81 mg total) by mouth daily. 30 tablet   ? Cholecalciferol (VITAMIN D3) 50 MCG (2000 UT) TABS Take 1 tablet by mouth daily.    ? escitalopram (LEXAPRO) 5 MG tablet TAKE 1 TABLET(5 MG) BY MOUTH DAILY 90 tablet 0  ? glucose blood (ONETOUCH ULTRA) test strip USE 1 STRIP TO CHECK GLUCOSE 4 TIMES DAILY. **PATIENT NEEDS APPOINTMENT FOR FURTHER REFILLS.** 150 each 0  ? insulin aspart (NOVOLOG) 100 UNIT/ML injection Inject 10 units into the skin up to 3 times a day if blood sugar is greater than 150. 10 mL PRN  ? Insulin Pen Needle (B-D UF III MINI PEN NEEDLES) 31G X 5 MM MISC USE TO INJECT INSULIN DAILY 100 each 1  ? lipase/protease/amylase (CREON) 36000 UNITS CPEP capsule Take 2 capsules (72,000 Units total) by mouth 3 (three) times daily with meals. May also take 1 capsule (36,000 Units  total) as needed (with snacks). 240 capsule 11  ? lisinopril (ZESTRIL) 5 MG tablet Take 1 tablet (5 mg total) by mouth daily. 90 tablet 3  ? metoprolol succinate (TOPROL-XL) 25 MG 24 hr tablet Take 1 tablet (25 mg total) by mouth daily. 90 tablet 3  ? Multiple Vitamins-Minerals (CENTRUM SILVER ULTRA MENS PO) Take 1 tablet by mouth daily.     ? pantoprazole (PROTONIX) 40 MG tablet Take 1 tablet by mouth once daily 90 tablet 3  ? rosuvastatin (CRESTOR) 40 MG tablet TAKE 1 TABLET(40 MG) BY MOUTH AT BEDTIME 30 tablet 6  ? insulin glargine (LANTUS SOLOSTAR) 100 UNIT/ML Solostar Pen Inject 68  Units into the skin every morning. And pen needles 1/day 70 mL 1  ? ?No facility-administered medications prior to visit.  ? ?Allergies  ?Allergen Reactions  ? Lipitor [Atorvastatin] Other (See Comments)  ?  [place]

## 2022-02-04 ENCOUNTER — Other Ambulatory Visit: Payer: Self-pay | Admitting: Family Medicine

## 2022-02-04 DIAGNOSIS — E7849 Other hyperlipidemia: Secondary | ICD-10-CM

## 2022-02-12 ENCOUNTER — Telehealth: Payer: Self-pay | Admitting: Family Medicine

## 2022-02-12 NOTE — Telephone Encounter (Signed)
Left message for patient to call back and schedule Medicare Annual Wellness Visit (AWV). Please offer to do virtually or by telephone.  Left office number and my jabber #[phone]. ? ?Due for AWVI ? ?Please schedule at anytime with Nurse Health Advisor. ?  ?

## 2022-03-19 ENCOUNTER — Telehealth: Payer: Self-pay | Admitting: Family Medicine

## 2022-03-19 DIAGNOSIS — E089 Diabetes mellitus due to underlying condition without complications: Secondary | ICD-10-CM

## 2022-03-19 MED ORDER — ONETOUCH ULTRA VI STRP: ORAL_STRIP | 2 refills | Status: DC

## 2022-03-19 MED ORDER — ACCU-CHEK SOFTCLIX LANCETS MISC: 1.0000 | 2 refills | Status: AC

## 2022-03-31 ENCOUNTER — Other Ambulatory Visit: Payer: Self-pay | Admitting: Family Medicine

## 2022-03-31 DIAGNOSIS — F419 Anxiety disorder, unspecified: Secondary | ICD-10-CM

## 2022-04-16 ENCOUNTER — Other Ambulatory Visit: Payer: Self-pay | Admitting: Family Medicine

## 2022-04-16 DIAGNOSIS — I10 Essential (primary) hypertension: Secondary | ICD-10-CM

## 2022-04-16 NOTE — Telephone Encounter (Signed)
Caller Name: Eitan Doubleday Call back phone #: [phone]  MEDICATION(S): Escitalpram  Days of Med Remaining: unsure  Has the patient contacted their pharmacy (YES/NO)? yes What did pharmacy advise?To call Dr Gena Fray to have this med sent to [facility]  Preferred Pharmacy: [facility] in [place]  ~~~Please advise patient/caregiver to allow 2-3 business days to process RX refills.

## 2022-04-21 ENCOUNTER — Telehealth: Payer: Self-pay | Admitting: Family Medicine

## 2022-04-21 DIAGNOSIS — F419 Anxiety disorder, unspecified: Secondary | ICD-10-CM

## 2022-04-21 MED ORDER — ESCITALOPRAM OXALATE 5 MG PO TABS: ORAL_TABLET | ORAL | 0 refills | Status: DC

## 2022-04-21 NOTE — Telephone Encounter (Addendum)
Caller Name: Enio Call back phone #:  [phone]  MEDICATION(S): escitalopram (LEXAPRO) 5 MG tablet  Days of Med Remaining: pt is out  Has the patient contacted their pharmacy (YES/NO)? [facility] told pt they don't have RX and that he needed to call to have [place] send to new pharmacy  Preferred Pharmacy:  [facility], [place] - [address] Phone:  [phone]  Fax:  [phone]

## 2022-04-28 ENCOUNTER — Ambulatory Visit: Payer: Medicare Other | Admitting: Family Medicine

## 2022-04-29 ENCOUNTER — Encounter: Payer: Self-pay | Admitting: Family Medicine

## 2022-04-29 ENCOUNTER — Ambulatory Visit (INDEPENDENT_AMBULATORY_CARE_PROVIDER_SITE_OTHER): Payer: Medicare Other | Admitting: Family Medicine

## 2022-04-29 VITALS — BP 138/76 | HR 92 | Temp 97.9°F | Ht 72.0 in | Wt 222.6 lb

## 2022-04-29 DIAGNOSIS — R7309 Other abnormal glucose: Secondary | ICD-10-CM

## 2022-04-29 DIAGNOSIS — E089 Diabetes mellitus due to underlying condition without complications: Secondary | ICD-10-CM

## 2022-04-29 DIAGNOSIS — I5042 Chronic combined systolic (congestive) and diastolic (congestive) heart failure: Secondary | ICD-10-CM

## 2022-04-29 DIAGNOSIS — I10 Essential (primary) hypertension: Secondary | ICD-10-CM

## 2022-04-29 DIAGNOSIS — F419 Anxiety disorder, unspecified: Secondary | ICD-10-CM

## 2022-04-29 DIAGNOSIS — K8689 Other specified diseases of pancreas: Secondary | ICD-10-CM

## 2022-04-29 DIAGNOSIS — E7849 Other hyperlipidemia: Secondary | ICD-10-CM

## 2022-04-29 LAB — GLUCOSE, RANDOM: Glucose, Bld: 171 mg/dL — ABNORMAL HIGH (ref 70–99)

## 2022-04-29 LAB — HEMOGLOBIN A1C: Hgb A1c MFr Bld: 9.4 % — ABNORMAL HIGH (ref 4.6–6.5)

## 2022-04-29 MED ORDER — PANCRELIPASE (LIP-PROT-AMYL) 36000-114000 UNITS PO CPEP: ORAL_CAPSULE | ORAL | 11 refills | Status: DC

## 2022-04-29 MED ORDER — ESCITALOPRAM OXALATE 10 MG PO TABS: ORAL_TABLET | ORAL | 3 refills | Status: DC

## 2022-04-29 NOTE — Progress Notes (Signed)
[facility] [address] [place] [address] Dept: [phone] Dept Fax: [phone]  Chronic Care Office Visit  Subjective:    Patient ID: David Lane, male    DOB: 1946-04-10, 76 y.o..   MRN: [id number]  Chief Complaint  Patient presents with   Follow-up    3 month f/u.  Not fasting (had OJ only) average BS  358,    average BP 15/90 - 180/90    History of Present Illness:  Patient is in today for reassessment of chronic medical issues.  Mr. Mayol has a history of coronary artery disease, ischemic cardiomyopathy, and heart failure with preserved EF (EF= 55-60%, 01/2020). He is managed currently with metoprolol 25 mg daily, aspirin 81 mg daily, rosuvastatin 40 mg daily, and lisinopril 5 mg daily (though he takes an additional 5 mg id his blood pressure is more elevated.    Mr. Lave has a history of pancreatic insufficiency related to a prior episode of gallstone pancreatitis. He was prescribed Creon to take 3 caps with each meal and 1 cap with in between meal snacks. In addition to concerns about cost of the medication, he noted when he takes that many, he becomes constipated. He stopped taking the enzymes and now has issues with watery diarrhea at times.   Mr. 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 This has primarily been managed by Dr. Loanne Drilling, who has stopped practice. He has been noted to have labile diabetes. He has a history of complications related to diabetic retinopathy.  He has had sugars ranging from the 60-upper 300s. He continues to note hypoglycemic symptoms when his blood sugars drop to around 100. He checks his blood sugars intermittently at home. He is on insulin glargine 70 units each evening and uses a sliding scale of insulin lispro.  Mr. Vana has a history of anxiety. he is managed on Lexapro 5 mg daily. He feels his anxiety is not well managed, as he notes he stresses  and worries about multiple aspects of his health.  Past Medical History: Patient Active Problem List   Diagnosis Date Noted   Diabetic retinopathy ([place]) 07/09/2021   Lower urinary tract symptoms (LUTS) 07/09/2021   Onychomycosis 03/17/2021   Skin pimple 03/17/2021   Coronary artery disease involving native coronary artery of native heart without angina pectoris 06/21/2019   AVM (arteriovenous malformation) of colon    History of colonic polyps    Duodenitis    Gastroesophageal reflux disease with esophagitis    Acute upper GI bleed 04/23/2019   Labile blood glucose    Functional gait disorder 01/20/2018   Acute blood loss anemia 01/20/2018   Status post transmetatarsal amputation of left foot ([place]) 01/20/2018   Status post coronary artery stent placement    Acute ST elevation myocardial infarction (STEMI) involving left anterior descending (LAD) coronary artery ([place]) 12/20/2017   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 ([place]) 12/14/2015   Pancreatic insufficiency 01/25/2013   Ischemic cardiomyopathy, EF 50% by echo 08/01/13 07/19/2012   Chronic combined systolic and diastolic CHF (congestive heart failure) ([place]) 07/14/2012   Pulmonary edema, most likely due to diastolic dysfunction 27/25/3664   CAD, 07/14/12- LAD/PDA DES    Anxiety 12/12/2009   Hyperlipidemia 04/24/2007   Essential hypertension 04/24/2007   History of pancreatitis 04/24/2007   Past Surgical History:  Procedure Laterality Date   AMPUTATION Left 01/14/2018   Procedure: TRANSMETATARSAL AMPUTATION;  Surgeon: Newt Minion, MD;  Location:  [facility];  Service: Orthopedics;  Laterality: Left;   BIOPSY  04/26/2019   Procedure: BIOPSY;  Surgeon: Irving Copas., MD;  Location: [facility];  Service: Gastroenterology;;   CARDIAC CATHETERIZATION  2011   minimal disease, medical management   COLONOSCOPY WITH PROPOFOL N/A 04/26/2019   Procedure: COLONOSCOPY WITH PROPOFOL;  Surgeon: Rush Landmark Telford Nab., MD;  Location: [facility];  Service: Gastroenterology;  Laterality: N/A;   CORONARY ANGIOPLASTY WITH STENT PLACEMENT  07/14/2012   successful PCI & stenting of mid LAD & PDA off the dominant CX   CORONARY/GRAFT ACUTE MI REVASCULARIZATION N/A 12/20/2017   Procedure: Coronary/Graft Acute MI Revascularization;  Surgeon: Martinique, Peter M, MD;  Location: [facility];  Service: Cardiovascular;  Laterality: N/A;   ENTEROSCOPY N/A 04/25/2019   Procedure: ENTEROSCOPY;  Surgeon: Rush Landmark Telford Nab., MD;  Location: [facility];  Service: Gastroenterology;  Laterality: N/A;   ESOPHAGOGASTRODUODENOSCOPY Left 04/24/2019   Procedure: ESOPHAGOGASTRODUODENOSCOPY (EGD);  Surgeon: Lavena Bullion, DO;  Location: [facility];  Service: Gastroenterology;  Laterality: Left;   HEMOSTASIS CLIP PLACEMENT  04/26/2019   Procedure: HEMOSTASIS CLIP PLACEMENT;  Surgeon: Irving Copas., MD;  Location: [facility];  Service: Gastroenterology;;   HOT HEMOSTASIS N/A 04/26/2019   Procedure: HOT HEMOSTASIS (ARGON PLASMA COAGULATION/BICAP);  Surgeon: Irving Copas., MD;  Location: [facility];  Service: Gastroenterology;  Laterality: N/A;   INCISION AND DRAINAGE ABSCESS ANAL  1970's   LEFT HEART CATH AND CORONARY ANGIOGRAPHY N/A 12/20/2017   Procedure: LEFT HEART CATH AND CORONARY ANGIOGRAPHY;  Surgeon: Martinique, Peter M, MD;  Location: [facility];  Service: Cardiovascular;  Laterality: N/A;   LEFT HEART CATHETERIZATION WITH CORONARY ANGIOGRAM N/A 07/14/2012   Procedure: LEFT HEART CATHETERIZATION WITH CORONARY ANGIOGRAM;  Surgeon: [name], MD;  Location: [facility];  Service: Cardiovascular;  Laterality: N/A;   PERCUTANEOUS CORONARY STENT INTERVENTION (PCI-S) Right 07/14/2012   Procedure: PERCUTANEOUS CORONARY STENT INTERVENTION (PCI-S);  Surgeon: [name], MD;  [facility];  Service: Cardiovascular;  Laterality: Right;   POLYPECTOMY  04/26/2019   Procedure:  POLYPECTOMY;  Surgeon: Mansouraty, Telford Nab., MD;  Location: [facility];  Service: Gastroenterology;;   SUBMUCOSAL TATTOO INJECTION  04/25/2019   Procedure: SUBMUCOSAL TATTOO INJECTION;  Surgeon: Irving Copas., MD;  Location: [facility];  Service: Gastroenterology;;   Family History  Problem Relation Age of Onset   Diabetes Mother    Diabetes Paternal Grandmother    Diabetes Maternal Grandfather    Colon cancer Neg Hx    Outpatient Medications Prior to Visit  Medication Sig Dispense Refill   Accu-Chek Softclix Lancets lancets 1 each by Other route as directed. Use as instructed 100 each 2   aspirin 81 MG tablet Take 1 tablet (81 mg total) by mouth daily. 30 tablet    Cholecalciferol (VITAMIN D3) 50 MCG (2000 UT) TABS Take 1 tablet by mouth daily.     glucose blood (ONETOUCH ULTRA) test strip USE 1 STRIP TO CHECK GLUCOSE 4 TIMES DAILY. 150 each 2   insulin aspart (NOVOLOG) 100 UNIT/ML injection Inject 10 units into the skin up to 3 times a day if blood sugar is greater than 150. 10 mL PRN   insulin glargine (LANTUS SOLOSTAR) 100 UNIT/ML Solostar Pen Inject 70 Units into the skin every morning. And pen needles 1/day 75 mL 2   Insulin Pen Needle (B-D UF III MINI PEN NEEDLES) 31G X 5 MM MISC USE TO INJECT INSULIN DAILY 100 each 1  lisinopril (ZESTRIL) 5 MG tablet Take 1 tablet by mouth once daily 90 tablet 3   metoprolol succinate (TOPROL-XL) 25 MG 24 hr tablet Take 1 tablet (25 mg total) by mouth daily. 90 tablet 3   Multiple Vitamins-Minerals (CENTRUM SILVER ULTRA MENS PO) Take 1 tablet by mouth daily.      pantoprazole (PROTONIX) 40 MG tablet Take 1 tablet by mouth once daily 90 tablet 3   rosuvastatin (CRESTOR) 40 MG tablet TAKE 1 TABLET(40 MG) BY MOUTH AT BEDTIME 30 tablet 6   escitalopram (LEXAPRO) 5 MG tablet TAKE 1 TABLET(5 MG) BY MOUTH DAILY 90 tablet 0   lipase/protease/amylase (CREON) 36000 UNITS CPEP capsule Take 2 capsules (72,000 Units total) by mouth 3 (three)  times daily with meals. May also take 1 capsule (36,000 Units total) as needed (with snacks). 240 capsule 11   No facility-administered medications prior to visit.   Allergies  Allergen Reactions   Lipitor [Atorvastatin] Other (See Comments)    Muscle weakness   Metformin And Related Diarrhea    Severe diarrhea   Objective:   Today's Vitals   04/29/22 1012 04/29/22 1156  BP: (!) 150/80 138/76  Pulse: 92   Temp: 97.9 F (36.6 C)   TempSrc: Temporal   SpO2: 95%   Weight: 222 lb 9.6 oz (101 kg)   Height: 6' (1.829 m)    Body mass index is 30.19 kg/m.   General: Well developed, well nourished. No acute distress. Psych: Alert and oriented. Normal mood and affect.  Health Maintenance Due  Topic Date Due   OPHTHALMOLOGY EXAM  Never done   Hepatitis C Screening  Never done   Zoster Vaccines- Shingrix (1 of 2) Never done   Pneumonia Vaccine 45+ Years old (2 - PPSV23 or PCV20) 12/05/2015     Assessment & Plan:   1. 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 [facility]) I reviewed Mr. Bouknight glucose meter data. It shows a 7-day average = 196 and a 30-day average = 246. Mr. Shackett has been interested in a CGM. He has also wondered about using an insulin pump in light of the labile nature of his blood sugars. His last A1c was 8.9%. I feel his diabetes care would benefit from ongoing involvement of an endocrinologist. I will place a referral.  - Glucose, random - Hemoglobin A1c - Ambulatory referral to Endocrinology  2. Labile blood glucose As above.  3. Pancreatic insufficiency Some fo the labile nature of his diabetes could relate to his pancreatic insufficiency and his stopping use of pancreatic enzymes. We discussed starting this back at a dose of 2 caps with meals.  - lipase/protease/amylase (CREON) 36000 UNITS CPEP capsule; Take 2 capsules (72,000 Units total) by mouth 3 (three) times daily with meals. May also take 1 capsule (36,000 Units total) as needed (with  snacks).  Dispense: 240 capsule; Refill: 11 - Ambulatory referral to Endocrinology  4. Anxiety Mr. Kirker feels his anxiety is not in good control. He continues to exhibit rapid speech. I will try increasing his Lexapro to see if this helps reduce some anxieties.  - escitalopram (LEXAPRO) 10 MG tablet; TAKE 1 TABLET(5 MG) BY MOUTH DAILY  Dispense: 90 tablet; Refill: 3  5. Essential hypertension Blood pressure was improved once he sat for a bit. We will continue his lisinopril 5 mg daily.  6. Chronic combined systolic and diastolic CHF (congestive heart failure) ([place]) Compensated. Continue metoprolol.  7. Other hyperlipidemia Stable. Continue rosuvastatin 40 mg daily.   Return in  about 3 months (around 07/30/2022) for Reassessment.   Haydee Salter, MD

## 2022-05-06 ENCOUNTER — Encounter: Payer: Self-pay | Admitting: Family Medicine

## 2022-05-24 ENCOUNTER — Telehealth: Payer: Self-pay

## 2022-05-24 NOTE — Telephone Encounter (Signed)
Patient called back regarding labs due to receiving the letter that was mailed.  Tried to call him back and he didn't answer and no VM set up. Will try later.  Dm/cma

## 2022-06-16 ENCOUNTER — Other Ambulatory Visit: Payer: Self-pay | Admitting: Family Medicine

## 2022-06-16 DIAGNOSIS — E7849 Other hyperlipidemia: Secondary | ICD-10-CM

## 2022-07-05 ENCOUNTER — Telehealth: Payer: Self-pay

## 2022-07-05 NOTE — Patient Outreach (Signed)
  Care Coordination   07/05/2022 Name: David Lane MRN: [id number] DOB: Sep 14, 1946   Care Coordination Outreach Attempts:  An unsuccessful telephone outreach was attempted today to offer the patient information about available care coordination services as a benefit of their health plan.   Follow Up Plan:  Additional outreach attempts will be made to offer the patient care coordination information and services.   Encounter Outcome:  No Answer  Care Coordination Interventions Activated:  No   Care Coordination Interventions:  No, not indicated    Jone Baseman, RN, MSN [facility] Care Management Care Management Coordinator Direct Line [phone]

## 2022-07-15 ENCOUNTER — Encounter: Payer: Self-pay | Admitting: Gastroenterology

## 2022-07-16 ENCOUNTER — Telehealth: Payer: Self-pay

## 2022-07-16 NOTE — Patient Outreach (Signed)
  Care Coordination   07/16/2022 Name: David Lane MRN: [id number] DOB: July 05, 1946   Care Coordination Outreach Attempts:  A second unsuccessful outreach was attempted today to offer the patient with information about available care coordination services as a benefit of their health plan.     Follow Up Plan:  Additional outreach attempts will be made to offer the patient care coordination information and services.   Encounter Outcome:  No Answer  Care Coordination Interventions Activated:  No   Care Coordination Interventions:  No, not indicated    Jone Baseman, RN, MSN [facility] Care Management Care Management Coordinator Direct Line [phone]

## 2022-07-30 ENCOUNTER — Telehealth: Payer: Self-pay | Admitting: Family Medicine

## 2022-07-30 ENCOUNTER — Ambulatory Visit: Payer: Medicare Other | Admitting: Family Medicine

## 2022-07-30 NOTE — Telephone Encounter (Signed)
Pt was a no show 11/03 for an OV with Dr. Gena Fray. This is his first, letter sent

## 2022-07-30 NOTE — Telephone Encounter (Signed)
Noted. Dm/cma  

## 2022-08-02 ENCOUNTER — Telehealth: Payer: Self-pay

## 2022-08-02 NOTE — Patient Outreach (Signed)
  Care Coordination   08/02/2022 Name: David Lane MRN: [id number] DOB: 1946/01/11   Care Coordination Outreach Attempts:  A third unsuccessful outreach was attempted today to offer the patient with information about available care coordination services as a benefit of their health plan.   Follow Up Plan:  No further outreach attempts will be made at this time. We have been unable to contact the patient to offer or enroll patient in care coordination services  Encounter Outcome:  No Answer  Care Coordination Interventions Activated:  No   Care Coordination Interventions:  No, not indicated    Jone Baseman, RN, MSN [facility] Care Management Coordinator Direct Line [phone]

## 2022-08-06 ENCOUNTER — Telehealth: Payer: Self-pay | Admitting: Family Medicine

## 2022-08-06 DIAGNOSIS — E089 Diabetes mellitus due to underlying condition without complications: Secondary | ICD-10-CM

## 2022-08-06 MED ORDER — INSULIN ASPART 100 UNIT/ML IJ SOLN: INTRAMUSCULAR | 99 refills | Status: DC

## 2022-08-06 NOTE — Telephone Encounter (Signed)
Caller Name: pt Call back phone #: Jash  MEDICATION(S):  Humalog( insulin lispro injection )  Days of Med Remaining:   Has the patient contacted their pharmacy (YES/NO)? no What did pharmacy advise?   Preferred Pharmacy:  [facility], [place] - [address] Phone: [phone]      ~~~Please advise patient/caregiver to allow 2-3 business days to process RX refills.  Pt stated his appt. With diabetic dr office is in the middle of December and he would like a refill before he runs

## 2022-08-06 NOTE — Telephone Encounter (Signed)
Rx was sent to the pharmacy.  Dm/cma

## 2022-08-07 ENCOUNTER — Other Ambulatory Visit: Payer: Self-pay | Admitting: Family Medicine

## 2022-08-07 DIAGNOSIS — K8689 Other specified diseases of pancreas: Secondary | ICD-10-CM

## 2022-08-09 ENCOUNTER — Other Ambulatory Visit: Payer: Self-pay | Admitting: Family Medicine

## 2022-08-09 DIAGNOSIS — E089 Diabetes mellitus due to underlying condition without complications: Secondary | ICD-10-CM

## 2022-08-11 ENCOUNTER — Other Ambulatory Visit: Payer: Self-pay | Admitting: Family Medicine

## 2022-08-11 DIAGNOSIS — E089 Diabetes mellitus due to underlying condition without complications: Secondary | ICD-10-CM

## 2022-08-11 MED ORDER — INSULIN LISPRO 100 UNIT/ML IJ SOLN: INTRAMUSCULAR | 11 refills | Status: DC

## 2022-09-03 ENCOUNTER — Ambulatory Visit: Payer: Medicare Other | Admitting: Internal Medicine

## 2022-09-06 ENCOUNTER — Telehealth: Payer: Self-pay | Admitting: Family Medicine

## 2022-09-06 NOTE — Telephone Encounter (Signed)
Tried calling both numbers. Cell number isn't in service.  And home number no one answered.  Dm/cma

## 2022-09-06 NOTE — Telephone Encounter (Signed)
Pt stated he went to pharmacy to pick up his insulin insulin lispro (HUMALOG) 100 UNIT/ML injection [[id number]] and pt stated that the pharmacy said it was put in wrong and that it has to be summitted again. Call pt if any questions

## 2022-09-08 NOTE — Telephone Encounter (Signed)
Tried calling both numbers. Cell number isn't in service.  And home number no one answered.  Dm/cma

## 2022-09-09 ENCOUNTER — Telehealth: Payer: Self-pay

## 2022-09-09 MED ORDER — ACCU-CHEK FASTCLIX LANCETS MISC: 1.0000 [IU] | Freq: Every day | 2 refills | Status: AC

## 2022-09-09 NOTE — Telephone Encounter (Signed)
Tried calling both numbers. Cell number isn't in service.  And home number no one answered.  Dm/cma

## 2022-09-09 NOTE — Telephone Encounter (Signed)
Received a refill request tor Accu-chek fast click lancets. RX sent to the pharmacy.  Dm/cma

## 2022-09-10 NOTE — Telephone Encounter (Signed)
Tried calling both numbers. Cell number isn't in service.  And home number no one answered.  Dm/cma

## 2022-09-16 ENCOUNTER — Ambulatory Visit: Payer: Medicare Other | Admitting: Internal Medicine

## 2022-10-04 ENCOUNTER — Ambulatory Visit (INDEPENDENT_AMBULATORY_CARE_PROVIDER_SITE_OTHER): Payer: Medicare Other

## 2022-10-04 VITALS — BP 123/70 | HR 98 | Ht 72.0 in | Wt 215.0 lb

## 2022-10-04 DIAGNOSIS — Z0000 Encounter for general adult medical examination without abnormal findings: Secondary | ICD-10-CM | POA: Diagnosis not present

## 2022-10-04 NOTE — Patient Instructions (Signed)
[name] , Thank you for taking time to come for your Medicare Wellness Visit. I appreciate your ongoing commitment to your health goals. Please review the following plan we discussed and let me know if I can assist you in the future.   These are the goals we discussed:  Goals      Monitor and Manage My Blood Sugar-Diabetes Type 2     Timeframe:  Long-Range Goal Priority:  High Start Date: 03/10/2021                            Expected End Date: 09/09/2021                      Follow Up Date 04/14/2021    - check blood sugar before meals and at bedtime - check blood sugar before and after exercise - check blood sugar if I feel it is too high or too low - take the blood sugar log to all doctor visits   Why is this important?   Checking your blood sugar at home helps to keep it from getting very high or very low.  Writing the results in a diary or log helps the doctor know how to care for you.  Your blood sugar log should have the time, date and the results.  Also, write down the amount of insulin or other medicine that you take.  Other information, like what you ate, exercise done and how you were feeling, will also be helpful.     Notes:      Track and Manage Fluids and Swelling-Heart Failure     Timeframe:  Long-Range Goal Priority:  High Start Date: 03/10/2021                            Expected End Date: 09/09/2021                      Follow Up Date 04/14/2021    - call office if I gain more than 2 pounds in one day or 5 pounds in one week - keep legs up while sitting - use salt in moderation - watch for swelling in feet, ankles and legs every day    Why is this important?   It is important to check your weight daily and watch how much salt and liquids you have.  It will help you to manage your heart failure.    Notes:         This is a list of the screening recommended for you and due dates:  Health Maintenance  Topic Date Due   Medicare Annual Wellness Visit   Never done   COVID-19 Vaccine (1) Never done   Eye exam for diabetics  Never done   Hepatitis C Screening: USPSTF Recommendation to screen - Ages 62-79 yo.  Never done   Zoster (Shingles) Vaccine (1 of 2) Never done   Pneumonia Vaccine (2 - PPSV23 or PCV20) 12/05/2015   Yearly kidney function blood test for diabetes  07/09/2022   Yearly kidney health urinalysis for diabetes  07/09/2022   Flu Shot  10/22/2024*   Hemoglobin A1C  10/30/2022   Complete foot exam   01/08/2023   DTaP/Tdap/Td vaccine (2 - Tdap) 12/05/2023   Colon Cancer Screening  04/25/2029   HPV Vaccine  Aged Out  *Topic was postponed. The date shown is  not the original due date.    Advanced directives: Please bring a copy of your POA (Power of Attorney) and/or Living Will to your next appointment.   Conditions/risks identified: none  Next appointment: Follow up in one year for your annual wellness visit.   Preventive Care 64 Years and Older, Male  Preventive care refers to lifestyle choices and visits with your health care provider that can promote health and wellness. What does preventive care include? A yearly physical exam. This is also called an annual well check. Dental exams once or twice a year. Routine eye exams. Ask your health care provider how often you should have your eyes checked. Personal lifestyle choices, including: Daily care of your teeth and gums. Regular physical activity. Eating a healthy diet. Avoiding tobacco and drug use. Limiting alcohol use. Practicing safe sex. Taking low doses of aspirin every day. Taking vitamin and mineral supplements as recommended by your health care provider. What happens during an annual well check? The services and screenings done by your health care provider during your annual well check will depend on your age, overall health, lifestyle risk factors, and family history of disease. Counseling  Your health care provider may ask you questions about  your: Alcohol use. Tobacco use. Drug use. Emotional well-being. Home and relationship well-being. Sexual activity. Eating habits. History of falls. Memory and ability to understand (cognition). Work and work Statistician. Screening  You may have the following tests or measurements: Height, weight, and BMI. Blood pressure. Lipid and cholesterol levels. These may be checked every 5 years, or more frequently if you are over 59 years old. Skin check. Lung cancer screening. You may have this screening every year starting at age 72 if you have a 30-pack-year history of smoking and currently smoke or have quit within the past 15 years. Fecal occult blood test (FOBT) of the stool. You may have this test every year starting at age 20. Flexible sigmoidoscopy or colonoscopy. You may have a sigmoidoscopy every 5 years or a colonoscopy every 10 years starting at age 75. Prostate cancer screening. Recommendations will vary depending on your family history and other risks. Hepatitis C blood test. Hepatitis B blood test. Sexually transmitted disease (STD) testing. Diabetes screening. This is done by checking your blood sugar (glucose) after you have not eaten for a while (fasting). You may have this done every 1-3 years. Abdominal aortic aneurysm (AAA) screening. You may need this if you are a current or former smoker. Osteoporosis. You may be screened starting at age 49 if you are at high risk. Talk with your health care provider about your test results, treatment options, and if necessary, the need for more tests. Vaccines  Your health care provider may recommend certain vaccines, such as: Influenza vaccine. This is recommended every year. Tetanus, diphtheria, and acellular pertussis (Tdap, Td) vaccine. You may need a Td booster every 10 years. Zoster vaccine. You may need this after age 44. Pneumococcal 13-valent conjugate (PCV13) vaccine. One dose is recommended after age 70. Pneumococcal  polysaccharide (PPSV23) vaccine. One dose is recommended after age 80. Talk to your health care provider about which screenings and vaccines you need and how often you need them. This information is not intended to replace advice given to you by your health care provider. Make sure you discuss any questions you have with your health care provider. Document Released: 10/10/2015 Document Revised: 06/02/2016 Document Reviewed: 07/15/2015 Elsevier Interactive Patient Education  2017 [place] Prevention in the  Home Falls can cause injuries. They can happen to people of all ages. There are many things you can do to make your home safe and to help prevent falls. What can I do on the outside of my home? Regularly fix the edges of walkways and driveways and fix any cracks. Remove anything that might make you trip as you walk through a door, such as a raised step or threshold. Trim any bushes or trees on the path to your home. Use bright outdoor lighting. Clear any walking paths of anything that might make someone trip, such as rocks or tools. Regularly check to see if handrails are loose or broken. Make sure that both sides of any steps have handrails. Any raised decks and porches should have guardrails on the edges. Have any leaves, snow, or ice cleared regularly. Use sand or salt on walking paths during winter. Clean up any spills in your garage right away. This includes oil or grease spills. What can I do in the bathroom? Use night lights. Install grab bars by the toilet and in the tub and shower. Do not use towel bars as grab bars. Use non-skid mats or decals in the tub or shower. If you need to sit down in the shower, use a plastic, non-slip stool. Keep the floor dry. Clean up any water that spills on the floor as soon as it happens. Remove soap buildup in the tub or shower regularly. Attach bath mats securely with double-sided non-slip rug tape. Do not have throw rugs and other  things on the floor that can make you trip. What can I do in the bedroom? Use night lights. Make sure that you have a light by your bed that is easy to reach. Do not use any sheets or blankets that are too big for your bed. They should not hang down onto the floor. Have a firm chair that has side arms. You can use this for support while you get dressed. Do not have throw rugs and other things on the floor that can make you trip. What can I do in the kitchen? Clean up any spills right away. Avoid walking on wet floors. Keep items that you use a lot in easy-to-reach places. If you need to reach something above you, use a strong step stool that has a grab bar. Keep electrical cords out of the way. Do not use floor polish or wax that makes floors slippery. If you must use wax, use non-skid floor wax. Do not have throw rugs and other things on the floor that can make you trip. What can I do with my stairs? Do not leave any items on the stairs. Make sure that there are handrails on both sides of the stairs and use them. Fix handrails that are broken or loose. Make sure that handrails are as long as the stairways. Check any carpeting to make sure that it is firmly attached to the stairs. Fix any carpet that is loose or worn. Avoid having throw rugs at the top or bottom of the stairs. If you do have throw rugs, attach them to the floor with carpet tape. Make sure that you have a light switch at the top of the stairs and the bottom of the stairs. If you do not have them, ask someone to add them for you. What else can I do to help prevent falls? Wear shoes that: Do not have high heels. Have rubber bottoms. Are comfortable and fit you well. Are closed at  the toe. Do not wear sandals. If you use a stepladder: Make sure that it is fully opened. Do not climb a closed stepladder. Make sure that both sides of the stepladder are locked into place. Ask someone to hold it for you, if possible. Clearly  mark and make sure that you can see: Any grab bars or handrails. First and last steps. Where the edge of each step is. Use tools that help you move around (mobility aids) if they are needed. These include: Canes. Walkers. Scooters. Crutches. Turn on the lights when you go into a dark area. Replace any light bulbs as soon as they burn out. Set up your furniture so you have a clear path. Avoid moving your furniture around. If any of your floors are uneven, fix them. If there are any pets around you, be aware of where they are. Review your medicines with your doctor. Some medicines can make you feel dizzy. This can increase your chance of falling. Ask your doctor what other things that you can do to help prevent falls. This information is not intended to replace advice given to you by your health care provider. Make sure you discuss any questions you have with your health care provider. Document Released: 07/10/2009 Document Revised: 02/19/2016 Document Reviewed: 10/18/2014 Elsevier Interactive Patient Education  2017 [employer].

## 2022-10-04 NOTE — Progress Notes (Signed)
I connected with David Lane today by telephone and verified that I am speaking with the correct person using two identifiers. Location patient: home Location provider: work Persons participating in the virtual visit: David Lane, [name].   I discussed the limitations, risks, security and privacy concerns of performing an evaluation and management service by telephone and the availability of in person appointments. I also discussed with the patient that there may be a patient responsible charge related to this service. The patient expressed understanding and verbally consented to this telephonic visit.    Interactive audio and video telecommunications were attempted between this provider and patient, however failed, due to patient having technical difficulties OR patient did not have access to video capability.  We continued and completed visit with audio only.     Vital signs may be patient reported or missing.  Subjective:   David Lane is a 77 y.o. male who presents for an Initial Medicare Annual Wellness Visit.  Review of Systems     Cardiac Risk Factors include: advanced age (>60mn, >>77women);diabetes mellitus;dyslipidemia;hypertension;male gender     Objective:    Today's Vitals   10/04/22 1259  BP: 123/70  Pulse: 98  Weight: 215 lb (97.5 kg)  Height: 6' (1.829 m)   Body mass index is 29.16 kg/m.     10/04/2022    1:12 PM 08/21/2019    4:38 PM 04/24/2019    4:14 AM 04/23/2019    6:26 PM 01/20/2018    7:53 PM 01/13/2018    1:00 AM 12/20/2017    9:00 PM  Advanced Directives  Does Patient Have a Medical Advance Directive? Yes No  No Yes Yes No  Type of [place]    Healthcare Power of [place]  Does patient want to make changes to medical advance directive?     No - Patient declined No - Patient declined   Copy of HMundayin Chart? No - copy requested     No - copy requested No - copy requested   Would patient like information on creating a medical advance directive?  No - Patient declined No - Patient declined  No - Patient declined No - Patient declined No - Patient declined    Current Medications (verified) Outpatient Encounter Medications as of 10/04/2022  Medication Sig   Accu-Chek FastClix Lancets MISC 1 Units by Does not apply route daily.   Accu-Chek Softclix Lancets lancets 1 each by Other route as directed. Use as instructed   aspirin 81 MG tablet Take 1 tablet (81 mg total) by mouth daily.   Cholecalciferol (VITAMIN D3) 50 MCG (2000 UT) TABS Take 1 tablet by mouth daily.   escitalopram (LEXAPRO) 10 MG tablet TAKE 1 TABLET(5 MG) BY MOUTH DAILY   glucose blood (ONETOUCH ULTRA) test strip USE 1 STRIP TO CHECK GLUCOSE 4 TIMES DAILY.   insulin glargine (LANTUS SOLOSTAR) 100 UNIT/ML Solostar Pen Inject 70 Units into the skin every morning. And pen needles 1/day   insulin lispro (HUMALOG) 100 UNIT/ML injection INJECT 10 UNITS SUBCUTANEOUSLY UP TO THREE TIMES DAILY IF BLOOD SUGAR IS GREATER THAN 150   Insulin Pen Needle (B-D UF III MINI PEN NEEDLES) 31G X 5 MM MISC USE TO INJECT INSULIN DAILY   lipase/protease/amylase (CREON) 36000 UNITS CPEP capsule Take 2 capsules (72,000 Units total) by mouth 3 (three) times daily with meals. May also take 1 capsule (36,000 Units total) as  needed (with snacks).   lisinopril (ZESTRIL) 5 MG tablet Take 1 tablet by mouth once daily   metoprolol succinate (TOPROL-XL) 25 MG 24 hr tablet Take 1 tablet (25 mg total) by mouth daily.   Multiple Vitamins-Minerals (CENTRUM SILVER ULTRA MENS PO) Take 1 tablet by mouth daily.    pantoprazole (PROTONIX) 40 MG tablet Take 1 tablet by mouth once daily   rosuvastatin (CRESTOR) 40 MG tablet TAKE 1 TABLET(40 MG) BY MOUTH AT BEDTIME   No facility-administered encounter medications on file as of 10/04/2022.    Allergies (verified) Lipitor [atorvastatin] and Metformin and  related   History: Past Medical History:  Diagnosis Date   CAD (coronary artery disease)    last cath by [facility] DR.  Mihai Croitoru showing  some  disease involving LCX and small size of Diag    CHF exacerbation, due to diastolic dysfunction 25/42/7062   Chronic systolic CHF (congestive heart failure), NYHA class 1 ([place]) 07/17/2012   GERD (gastroesophageal reflux disease)    Hepatic lesion 02/04/11   Hyperlipemia    Hypertension    Ischemic cardiomyopathy    EF 35-40%   Liver hemangioma    NSTEMI (non-ST elevated myocardial infarction) ([place]) 11/21/2009   Pancreatitis 2000's   Shortness of breath    Type II diabetes mellitus ([place])    Past Surgical History:  Procedure Laterality Date   AMPUTATION Left 01/14/2018   Procedure: TRANSMETATARSAL AMPUTATION;  Surgeon: Newt Minion, MD;  Location: [place];  Service: Orthopedics;  Laterality: Left;   BIOPSY  04/26/2019   Procedure: BIOPSY;  Surgeon: Irving Copas., MD;  Location: [facility];  Service: Gastroenterology;;   CARDIAC CATHETERIZATION  2011   minimal disease, medical management   COLONOSCOPY WITH PROPOFOL N/A 04/26/2019   Procedure: COLONOSCOPY WITH PROPOFOL;  Surgeon: Rush Landmark Telford Nab., MD;  Location: [facility];  Service: Gastroenterology;  Laterality: N/A;   CORONARY ANGIOPLASTY WITH STENT PLACEMENT  07/14/2012   successful PCI & stenting of mid LAD & PDA off the dominant CX   CORONARY/GRAFT ACUTE MI REVASCULARIZATION N/A 12/20/2017   Procedure: Coronary/Graft Acute MI Revascularization;  Surgeon: Martinique, Peter M, MD;  Location: [place] [facility];  Service: Cardiovascular;  Laterality: N/A;   ENTEROSCOPY N/A 04/25/2019   Procedure: ENTEROSCOPY;  Surgeon: Rush Landmark Telford Nab., MD;  [facility];  Service: Gastroenterology;  Laterality: N/A;   ESOPHAGOGASTRODUODENOSCOPY Left 04/24/2019   Procedure: ESOPHAGOGASTRODUODENOSCOPY (EGD);  Surgeon: Lavena Bullion, DO;  [facility];  Service:  Gastroenterology;  Laterality: Left;   HEMOSTASIS CLIP PLACEMENT  04/26/2019   Procedure: HEMOSTASIS CLIP PLACEMENT;  Surgeon: Irving Copas., MD;  [facility];  Service: Gastroenterology;;   HOT HEMOSTASIS N/A 04/26/2019   Procedure: HOT HEMOSTASIS (ARGON PLASMA COAGULATION/BICAP);  Surgeon: Irving Copas., MD;  Location: [facility];  Service: Gastroenterology;  Laterality: N/A;   INCISION AND DRAINAGE ABSCESS ANAL  1970's   LEFT HEART CATH AND CORONARY ANGIOGRAPHY N/A 12/20/2017   Procedure: LEFT HEART CATH AND CORONARY ANGIOGRAPHY;  Surgeon: Martinique, Peter M, MD;  Location: [place] [facility];  Service: Cardiovascular;  Laterality: N/A;   LEFT HEART CATHETERIZATION WITH CORONARY ANGIOGRAM N/A 07/14/2012   Procedure: LEFT HEART CATHETERIZATION WITH CORONARY ANGIOGRAM;  Surgeon: [name], MD;  Location: [place] [facility];  Service: Cardiovascular;  Laterality: N/A;   PERCUTANEOUS CORONARY STENT INTERVENTION (PCI-S) Right 07/14/2012   Procedure: PERCUTANEOUS CORONARY STENT INTERVENTION (PCI-S);  Surgeon: [name], MD;  Location: [facility];  Service: Cardiovascular;  Laterality: Right;   POLYPECTOMY  04/26/2019   Procedure: POLYPECTOMY;  Surgeon: Mansouraty, Telford Nab., MD;  Location: [facility];  Service: Gastroenterology;;   SUBMUCOSAL TATTOO INJECTION  04/25/2019   Procedure: SUBMUCOSAL TATTOO INJECTION;  Surgeon: Irving Copas., MD;  Location: [facility];  Service: Gastroenterology;;   Family History  Problem Relation Age of Onset   Diabetes Mother    Diabetes Paternal Grandmother    Diabetes Maternal Grandfather    Colon cancer Neg Hx    Social History   Socioeconomic History   Marital status: Single    Spouse name: Not on file   Number of children: 0   Years of education: Not on file   Highest education level: Not on file  Occupational History   Occupation: retired    Fish farm manager: [employer]  Tobacco Use   Smoking status: Never    Smokeless tobacco: Never  Vaping Use   Vaping Use: Never used  Substance and Sexual Activity   Alcohol use: No   Drug use: No   Sexual activity: Not Currently  Other Topics Concern   Not on file  Social History Narrative   Not on file   Social Determinants of Health   Financial Resource Strain: [place]  (10/04/2022)   Overall Financial Resource Strain (CARDIA)    Difficulty of Paying Living Expenses: Not hard at all  Food Insecurity: No Food Insecurity (10/04/2022)   Hunger Vital Sign    Worried About Running Out of Food in the Last Year: Never true    [place] in the Last Year: Never true  Transportation Needs: No Transportation Needs (10/04/2022)   PRAPARE - Hydrologist (Medical): No    Lack of Transportation (Non-Medical): No  Physical Activity: Inactive (10/04/2022)   Exercise Vital Sign    Days of Exercise per Week: 0 days    Minutes of Exercise per Session: 0 min  Stress: No Stress Concern Present (10/04/2022)   Blanket    Feeling of Stress : Not at all  Social Connections: Not on file    Tobacco Counseling Counseling given: Not Answered   Clinical Intake:  Pre-visit preparation completed: Yes  Pain : No/denies pain     Nutritional Status: BMI 25 -29 Overweight Nutritional Risks: None Diabetes: Yes  How often do you need to have someone help you when you read instructions, pamphlets, or other written materials from your doctor or pharmacy?: 1 - Never  Diabetic? Yes Nutrition Risk Assessment:  Has the patient had any N/V/D within the last 2 months?  No  Does the patient have any non-healing wounds?  No  Has the patient had any unintentional weight loss or weight gain?  No   Diabetes:  Is the patient diabetic?  Yes  If diabetic, was a CBG obtained today?  No  Did the patient bring in their glucometer from home?  No  How often do you monitor your CBG's?  Three daily.   Financial Strains and Diabetes Management:  Are you having any financial strains with the device, your supplies or your medication? No .  Does the patient want to be seen by Chronic Care Management for management of their diabetes?  No  Would the patient like to be referred to a Nutritionist or for Diabetic Management?  No   Diabetic Exams:  Diabetic Eye Exam: Overdue for diabetic eye exam. Pt has been advised about the  importance in completing this exam. Patient advised to call and schedule an eye exam. Diabetic Foot Exam: Completed 01/07/2022   Interpreter Needed?: No  Information entered by :: NAllen LPN   Activities of Daily Living    10/04/2022    1:15 PM  In your present state of health, do you have any difficulty performing the following activities:  Hearing? 0  Vision? 0  Difficulty concentrating or making decisions? 0  Walking or climbing stairs? 0  Dressing or bathing? 0  Doing errands, shopping? 0  Preparing Food and eating ? N  Using the Toilet? N  In the past six months, have you accidently leaked urine? N  Do you have problems with loss of bowel control? N  Managing your Medications? N  Managing your Finances? N  Housekeeping or managing your Housekeeping? N    Patient Care Team: Haydee Salter, MD as PCP - General (Family Medicine) Germaine Pomfret, [facility] as Pharmacist (Pharmacist) Renato Shin, MD (Inactive) as Consulting Physician (Endocrinology) Newt Minion, MD as Consulting Physician (Orthopedic Surgery) Croitoru, Dani Gobble, MD as Consulting Physician (Cardiology)  Indicate any recent Medical Services you may have received from other than [facility] providers in the past year (date may be approximate).     Assessment:   This is a routine wellness examination for [place].  Hearing/Vision screen Vision Screening - Comments:: No regular eye exams,  Dietary issues and exercise activities discussed: Current Exercise Habits: The patient  does not participate in regular exercise at present   Goals Addressed             This Visit's Progress    Patient Stated       10/04/2022, trying to eat small portions       Depression Screen    10/04/2022    1:15 PM 01/07/2022   10:47 AM 10/27/2020    2:15 PM 12/05/2014    3:22 PM  PHQ 2/9 Scores  PHQ - 2 Score 0 0 0 0    Fall Risk    10/04/2022    1:14 PM 01/07/2022   10:47 AM 10/27/2020    2:15 PM 02/08/2018   10:58 AM 12/05/2014    3:22 PM  [place] in the past year? 0 0 0 No No  Number falls in past yr: 0 0     Injury with Fall? 0 0     Risk for fall due to : Medication side effect No Fall Risks     Follow up Falls prevention discussed;Education provided;Falls evaluation completed Falls evaluation completed       FALL RISK PREVENTION PERTAINING TO THE HOME:  Any stairs in or around the home? Yes  If so, are there any without handrails? Yes  Home free of loose throw rugs in walkways, pet beds, electrical cords, etc? Yes  Adequate lighting in your home to reduce risk of falls? Yes   ASSISTIVE DEVICES UTILIZED TO PREVENT FALLS:  Life alert? No  Use of a cane, walker or w/c? No  Grab bars in the bathroom? No  Shower chair or bench in shower? No  Elevated toilet seat or a handicapped toilet? No   TIMED UP AND GO:  Was the test performed? No .       Cognitive Function:        10/04/2022    1:16 PM  6CIT Screen  What Year? 0 points  What month? 0 points  What time? 0 points  Count back  from 20 2 points  Months in reverse 2 points  Repeat phrase 4 points  Total Score 8 points    Immunizations Immunization History  Administered Date(s) Administered   Pneumococcal Conjugate-13 12/05/2014   Td 12/04/2013    TDAP status: Up to date  Flu Vaccine status: Declined, Education has been provided regarding the importance of this vaccine but patient still declined. Advised may receive this vaccine at local pharmacy or Health Dept. Aware to provide a  copy of the vaccination record if obtained from local pharmacy or Health Dept. Verbalized acceptance and understanding.  Pneumococcal vaccine status: Declined,  Education has been provided regarding the importance of this vaccine but patient still declined. Advised may receive this vaccine at local pharmacy or Health Dept. Aware to provide a copy of the vaccination record if obtained from local pharmacy or Health Dept. Verbalized acceptance and understanding.   Covid-19 vaccine status: Declined, Education has been provided regarding the importance of this vaccine but patient still declined. Advised may receive this vaccine at local pharmacy or Health Dept.or vaccine clinic. Aware to provide a copy of the vaccination record if obtained from local pharmacy or Health Dept. Verbalized acceptance and understanding.  Qualifies for Shingles Vaccine? Yes   Zostavax completed No   Shingrix Completed?: No.    Education has been provided regarding the importance of this vaccine. Patient has been advised to call insurance company to determine out of pocket expense if they have not yet received this vaccine. Advised may also receive vaccine at local pharmacy or Health Dept. Verbalized acceptance and understanding.  Screening Tests Health Maintenance  Topic Date Due   Medicare Annual Wellness (AWV)  Never done   COVID-19 Vaccine (1) Never done   OPHTHALMOLOGY EXAM  Never done   Hepatitis C Screening  Never done   Zoster Vaccines- Shingrix (1 of 2) Never done   Pneumonia Vaccine 79+ Years old (2 - PPSV23 or PCV20) 12/05/2015   Diabetic kidney evaluation - eGFR measurement  07/09/2022   Diabetic kidney evaluation - Urine ACR  07/09/2022   INFLUENZA VACCINE  10/22/2024 (Originally 04/27/2022)   HEMOGLOBIN A1C  10/30/2022   FOOT EXAM  01/08/2023   DTaP/Tdap/Td (2 - Tdap) 12/05/2023   COLONOSCOPY (Pts 45-70yr Insurance coverage will need to be confirmed)  04/25/2029   HPV VACCINES  Aged Out    Health  Maintenance  Health Maintenance Due  Topic Date Due   Medicare Annual Wellness (AWV)  Never done   COVID-19 Vaccine (1) Never done   OPHTHALMOLOGY EXAM  Never done   Hepatitis C Screening  Never done   Zoster Vaccines- Shingrix (1 of 2) Never done   Pneumonia Vaccine 75 Years old (2 - PPSV23 or PCV20) 12/05/2015   Diabetic kidney evaluation - eGFR measurement  07/09/2022   Diabetic kidney evaluation - Urine ACR  07/09/2022    Colorectal cancer screening: Type of screening: Colonoscopy. Completed 04/26/2019. Repeat every 3 years  Lung Cancer Screening: (Low Dose CT Chest recommended if Age 78109-80years, 30 pack-year currently smoking OR have quit w/in 15years.) does not qualify.   Lung Cancer Screening Referral: no  Additional Screening:  Hepatitis C Screening: does qualify;   Vision Screening: Recommended annual ophthalmology exams for early detection of glaucoma and other disorders of the eye. Is the patient up to date with their annual eye exam?  No  Who is the provider or what is the name of the office in which the patient attends annual eye exams?  none If pt is not established with a provider, would they like to be referred to a provider to establish care? No .   Dental Screening: Recommended annual dental exams for proper oral hygiene  Community Resource Referral / Chronic Care Management: CRR required this visit?  No   CCM required this visit?  No      Plan:     I have personally reviewed and noted the following in the patient's chart:   Medical and social history Use of alcohol, tobacco or illicit drugs  Current medications and supplements including opioid prescriptions. Patient is not currently taking opioid prescriptions. Functional ability and status Nutritional status Physical activity Advanced directives List of other physicians Hospitalizations, surgeries, and ER visits in previous 12 months Vitals Screenings to include cognitive, depression, and  falls Referrals and appointments  In addition, I have reviewed and discussed with patient certain preventive protocols, quality metrics, and best practice recommendations. A written personalized care plan for preventive services as well as general preventive health recommendations were provided to patient.     Kellie Simmering, LPN   01/02/2499   Nurse Notes: none  Due to this being a virtual visit, the after visit summary with patients personalized plan was offered to patient via mail or my-chart. Patient would like to access on my-chart

## 2022-10-06 ENCOUNTER — Ambulatory Visit: Payer: Medicare Other | Admitting: Internal Medicine

## 2022-10-06 ENCOUNTER — Encounter: Payer: Self-pay | Admitting: Internal Medicine

## 2022-10-06 VITALS — BP 120/80 | HR 87 | Ht 72.0 in | Wt 219.0 lb

## 2022-10-06 DIAGNOSIS — E0865 Diabetes mellitus due to underlying condition with hyperglycemia: Secondary | ICD-10-CM

## 2022-10-06 DIAGNOSIS — K8689 Other specified diseases of pancreas: Secondary | ICD-10-CM | POA: Diagnosis not present

## 2022-10-06 DIAGNOSIS — E1165 Type 2 diabetes mellitus with hyperglycemia: Secondary | ICD-10-CM

## 2022-10-06 DIAGNOSIS — E7849 Other hyperlipidemia: Secondary | ICD-10-CM | POA: Diagnosis not present

## 2022-10-06 DIAGNOSIS — E0859 Diabetes mellitus due to underlying condition with other circulatory complications: Secondary | ICD-10-CM | POA: Diagnosis not present

## 2022-10-06 DIAGNOSIS — E089 Diabetes mellitus due to underlying condition without complications: Secondary | ICD-10-CM

## 2022-10-06 LAB — POCT GLYCOSYLATED HEMOGLOBIN (HGB A1C): Hemoglobin A1C: 8.9 % — AB (ref 4.0–5.6)

## 2022-10-06 MED ORDER — FREESTYLE LIBRE 2 SENSOR MISC: 1.0000 | 3 refills | Status: DC

## 2022-10-06 MED ORDER — LANTUS SOLOSTAR 100 UNIT/ML ~~LOC~~ SOPN: 60.0000 [IU] | PEN_INJECTOR | SUBCUTANEOUS | 2 refills | Status: DC

## 2022-10-06 MED ORDER — FREESTYLE LIBRE 2 READER DEVI: 1.0000 | Freq: Every day | 3 refills | Status: DC

## 2022-10-06 NOTE — Patient Instructions (Addendum)
Please change: - Lantus 60 units  in am  Use: - NovoLog 8-12 units 3x a day, 15 min before meals  Start the CGM.  Please return for another visit in 3 months.   PATIENT INSTRUCTIONS FOR TYPE 2 DIABETES:  **Please join MyChart!** - see attached instructions about how to join if you have not done so already.  DIET AND EXERCISE Diet and exercise is an important part of diabetic treatment.  We recommended aerobic exercise in the form of brisk walking (working between 40-60% of maximal aerobic capacity, similar to brisk walking) for 150 minutes per week (such as 30 minutes five days per week) along with 3 times per week performing 'resistance' training (using various gauge rubber tubes with handles) 5-10 exercises involving the major muscle groups (upper body, lower body and core) performing 10-15 repetitions (or near fatigue) each exercise. Start at half the above goal but build slowly to reach the above goals. If limited by weight, joint pain, or disability, we recommend daily walking in a swimming pool with water up to waist to reduce pressure from joints while allow for adequate exercise.    BLOOD GLUCOSES Monitoring your blood glucoses is important for continued management of your diabetes. Please check your blood glucoses 2-4 times a day: fasting, before meals and at bedtime (you can rotate these measurements - e.g. one day check before the 3 meals, the next day check before 2 of the meals and before bedtime, etc.).   HYPOGLYCEMIA (low blood sugar) Hypoglycemia is usually a reaction to not eating, exercising, or taking too much insulin/ other diabetes drugs.  Symptoms include tremors, sweating, hunger, confusion, headache, etc. Treat IMMEDIATELY with 15 grams of Carbs: 4 glucose tablets  cup regular juice/soda 2 tablespoons raisins 4 teaspoons sugar 1 tablespoon honey Recheck blood glucose in 15 mins and repeat above if still symptomatic/blood glucose <100.  RECOMMENDATIONS TO  REDUCE YOUR RISK OF DIABETIC COMPLICATIONS: * Take your prescribed MEDICATION(S) * Follow a DIABETIC diet: Complex carbs, fiber rich foods, (monounsaturated and polyunsaturated) fats * AVOID saturated/trans fats, high fat foods, >2,300 mg salt per day. * EXERCISE at least 5 times a week for 30 minutes or preferably daily.  * DO NOT SMOKE OR DRINK more than 1 drink a day. * Check your FEET every day. Do not wear tightfitting shoes. Contact us if you develop an ulcer * See your EYE doctor once a year or more if needed * Get a FLU shot once a year * Get a PNEUMONIA vaccine once before and once after age 51 years  GOALS:  * Your Hemoglobin A1c of <7%  * fasting sugars need to be 80-130 * after meals sugars need to be <180 (2h after you start eating) * Your Systolic BP should be 300 or lower  * Your Diastolic BP should be 80 or lower  * Your HDL (Good Cholesterol) should be 40 or higher  * Your LDL (Bad Cholesterol) should be ideally <70. * Your Triglycerides should be 150 or lower  * Your Urine microalbumin (kidney function) should be <30 * Your Body Mass Index should be 25 or lower   Please consider the following ways to cut down carbs and fat and increase fiber and micronutrients in your diet: - substitute whole grain for white bread or pasta - substitute brown rice for white rice - substitute 90-calorie flat bread pieces for slices of bread when possible - substitute sweet potatoes or yams for white potatoes - substitute humus for  margarine - substitute tofu for cheese when possible - substitute almond or rice milk for regular milk (would not drink soy milk daily due to concern for soy estrogen influence on breast cancer risk) - substitute dark chocolate for other sweets when possible - substitute water - can add lemon or orange slices for taste - for diet sodas (artificial sweeteners will trick your body that you can eat sweets without getting calories and will lead you to overeating  and weight gain in the long run) - do not skip breakfast or other meals (this will slow down the metabolism and will result in more weight gain over time)  - can try smoothies made from fruit and almond/rice milk in am instead of regular breakfast - can also try old-fashioned (not instant) oatmeal made with almond/rice milk in am - order the dressing on the side when eating salad at a restaurant (pour less than half of the dressing on the salad) - eat as little meat as possible - can try juicing, but should not forget that juicing will get rid of the fiber, so would alternate with eating raw veg./fruits or drinking smoothies - use as little oil as possible, even when using olive oil - can dress a salad with a mix of balsamic vinegar and lemon juice, for e.g. - use agave nectar, stevia sugar, or regular sugar rather than artificial sweateners - steam or broil/roast veggies  - snack on veggies/fruit/nuts (unsalted, preferably) when possible, rather than processed foods - reduce or eliminate aspartame in diet (it is in diet sodas, chewing gum, etc) Read the labels!  Try to read Dr. Janene Harvey book: "Program for Reversing Diabetes" for other ideas for healthy eating.

## 2022-10-06 NOTE — Progress Notes (Signed)
Patient ID: David Lane, male   DOB: 1946/08/30, 77 y.o.   MRN: [id number]  HPI: David Lane is a 77 y.o.-year-old male, returning for follow-up for DM2, dx in 1988 during an episode of gallstone pancreatitis, insulin-dependent since 1990, uncontrolled, with complications (CAD - s/p AMI, CHF, DR, CKD, PN). Pt. previously saw Dr. Loanne Drilling, last visit almost 2 years ago. He is here with his sister.  Reviewed HbA1c: Lab Results  Component Value Date   HGBA1C 9.4 (H) 04/29/2022   HGBA1C 8.9 (H) 01/07/2022   HGBA1C 8.8 (H) 10/09/2021   HGBA1C 9.4 (H) 07/09/2021   HGBA1C 8.2 (A) 11/20/2020   HGBA1C 8.8 (A) 08/14/2020   HGBA1C 8.1 (A) 08/27/2019   HGBA1C 8.2 (A) 06/12/2019   HGBA1C 9.1 (A) 03/29/2019   HGBA1C 8.8 (A) 10/02/2018   Pt is on a regimen of: - Lantus 70 units in am - Novolog 5-10 units 3x a day, before meals (vials)  He tried Metformin >> fatigue and weakness.  Pt checks his sugars 1-3x a day and they are: - am: 61-299, 503 - 2h after b'fast: n/c - before lunch: n/c - 2h after lunch: 170-384 - before dinner: n/c - 2h after dinner: n/c - bedtime: 70-541 - nighttime: n/c Lowest sugar was 61; he has hypoglycemia awareness at 100.  Highest sugar was 541.  Glucometer: Accu-Chek/One Touch ultra  - + Mild CKD, last BUN/creatinine:  Lab Results  Component Value Date   BUN 24 (H) 07/09/2021   BUN 24 (H) 08/21/2019   CREATININE 1.12 07/09/2021   CREATININE 1.25 (H) 08/21/2019  , Lisinopril 5 mg  -+ HL; last set of lipids: Lab Results  Component Value Date   CHOL 89 07/09/2021   HDL 32.40 (L) 07/09/2021   LDLCALC 41 07/09/2021   TRIG 81.0 07/09/2021   CHOLHDL 3 07/09/2021  On Crestor 40 mg daily.  - last eye exam was in 2021-2022. + DR - had to have IO injections. Had cataract surgery in the last year.  - no numbness and tingling in his feet.  Last foot exam 01/07/2022.  He also has a history of HTN, GERD, anxiety, onychomycosis.  ROS: + see HPI No  increased urination, blurry vision, nausea, chest pain.  His sister describes that he has lack of stamina.  Past Medical History:  Diagnosis Date   CAD (coronary artery disease)    last cath by [facility] DR.  Mihai Croitoru showing  some  disease involving LCX and small size of Diag    CHF exacerbation, due to diastolic dysfunction 57/84/6962   Chronic systolic CHF (congestive heart failure), NYHA class 1 ([place]) 07/17/2012   GERD (gastroesophageal reflux disease)    Hepatic lesion 02/04/11   Hyperlipemia    Hypertension    Ischemic cardiomyopathy    EF 35-40%   Liver hemangioma    NSTEMI (non-ST elevated myocardial infarction) ([place]) 11/21/2009   Pancreatitis 2000's   Shortness of breath    Type II diabetes mellitus ([place])    Past Surgical History:  Procedure Laterality Date   AMPUTATION Left 01/14/2018   Procedure: TRANSMETATARSAL AMPUTATION;  Surgeon: Newt Minion, MD;  Location: [place];  Service: Orthopedics;  Laterality: Left;   BIOPSY  04/26/2019   Procedure: BIOPSY;  Surgeon: Rush Landmark Telford Nab., MD;  Location: [facility];  Service: Gastroenterology;;   CARDIAC CATHETERIZATION  2011   minimal disease, medical management   COLONOSCOPY WITH PROPOFOL N/A 04/26/2019   Procedure: COLONOSCOPY WITH PROPOFOL;  Surgeon: Rush Landmark,  Telford Nab., MD;  Location: [facility];  Service: Gastroenterology;  Laterality: N/A;   CORONARY ANGIOPLASTY WITH STENT PLACEMENT  07/14/2012   successful PCI & stenting of mid LAD & PDA off the dominant CX   CORONARY/GRAFT ACUTE MI REVASCULARIZATION N/A 12/20/2017   Procedure: Coronary/Graft Acute MI Revascularization;  Surgeon: Martinique, Peter M, MD;  Location: [place] [facility];  Service: Cardiovascular;  Laterality: N/A;   ENTEROSCOPY N/A 04/25/2019   Procedure: ENTEROSCOPY;  Surgeon: Rush Landmark Telford Nab., MD;  Location: [facility];  Service: Gastroenterology;  Laterality: N/A;   ESOPHAGOGASTRODUODENOSCOPY Left 04/24/2019   Procedure:  ESOPHAGOGASTRODUODENOSCOPY (EGD);  Surgeon: Lavena Bullion, DO;  Location: [facility];  Service: Gastroenterology;  Laterality: Left;   HEMOSTASIS CLIP PLACEMENT  04/26/2019   Procedure: HEMOSTASIS CLIP PLACEMENT;  Surgeon: Irving Copas., MD;  Location: [facility];  Service: Gastroenterology;;   HOT HEMOSTASIS N/A 04/26/2019   Procedure: HOT HEMOSTASIS (ARGON PLASMA COAGULATION/BICAP);  Surgeon: Irving Copas., MD;  Location: [facility];  Service: Gastroenterology;  Laterality: N/A;   INCISION AND DRAINAGE ABSCESS ANAL  1970's   LEFT HEART CATH AND CORONARY ANGIOGRAPHY N/A 12/20/2017   Procedure: LEFT HEART CATH AND CORONARY ANGIOGRAPHY;  Surgeon: Martinique, Peter M, MD;  Location: [place] [facility];  Service: Cardiovascular;  Laterality: N/A;   LEFT HEART CATHETERIZATION WITH CORONARY ANGIOGRAM N/A 07/14/2012   Procedure: LEFT HEART CATHETERIZATION WITH CORONARY ANGIOGRAM;  Surgeon: [name], MD;  Location: [place] [facility];  Service: Cardiovascular;  Laterality: N/A;   PERCUTANEOUS CORONARY STENT INTERVENTION (PCI-S) Right 07/14/2012   Procedure: PERCUTANEOUS CORONARY STENT INTERVENTION (PCI-S);  Surgeon: [name], MD;  Location: [facility];  Service: Cardiovascular;  Laterality: Right;   POLYPECTOMY  04/26/2019   Procedure: POLYPECTOMY;  Surgeon: Mansouraty, Telford Nab., MD;  Location: [facility];  Service: Gastroenterology;;   SUBMUCOSAL TATTOO INJECTION  04/25/2019   Procedure: SUBMUCOSAL TATTOO INJECTION;  Surgeon: Irving Copas., MD;  Location: [facility];  Service: Gastroenterology;;   Social History   Socioeconomic History   Marital status: Single    Spouse name: Not on file   Number of children: 0   Years of education: Not on file   Highest education level: Not on file  Occupational History   Occupation: retired    Fish farm manager: [employer]  Tobacco Use   Smoking status: Never   Smokeless tobacco: Never  Vaping Use   Vaping Use: Never  used  Substance and Sexual Activity   Alcohol use: No   Drug use: No   Sexual activity: Not Currently  Other Topics Concern   Not on file  Social History Narrative   Not on file   Social Determinants of Health   Financial Resource Strain: Low Risk  (10/04/2022)   Overall Financial Resource Strain (CARDIA)    Difficulty of Paying Living Expenses: Not hard at all  Food Insecurity: No Food Insecurity (10/04/2022)   Hunger Vital Sign    Worried About Running Out of Food in the Last Year: Never true    [place] in the Last Year: Never true  Transportation Needs: No Transportation Needs (10/04/2022)   PRAPARE - Hydrologist (Medical): No    Lack of Transportation (Non-Medical): No  Physical Activity: Inactive (10/04/2022)   Exercise Vital Sign    Days of Exercise per Week: 0 days    Minutes of Exercise per Session: 0 min  Stress: No Stress Concern Present (10/04/2022)   [place]  [employer] - Occupational Stress Questionnaire    Feeling of Stress : Not at all  Social Connections: Not on file  Intimate Partner Violence: Not on file   Current Outpatient Medications on File Prior to Visit  Medication Sig Dispense Refill   Accu-Chek FastClix Lancets MISC 1 Units by Does not apply route daily. 100 each 2   Accu-Chek Softclix Lancets lancets 1 each by Other route as directed. Use as instructed 100 each 2   aspirin 81 MG tablet Take 1 tablet (81 mg total) by mouth daily. 30 tablet    Cholecalciferol (VITAMIN D3) 50 MCG (2000 UT) TABS Take 1 tablet by mouth daily.     escitalopram (LEXAPRO) 10 MG tablet TAKE 1 TABLET(5 MG) BY MOUTH DAILY 90 tablet 3   glucose blood (ONETOUCH ULTRA) test strip USE 1 STRIP TO CHECK GLUCOSE 4 TIMES DAILY. 150 each 2   insulin glargine (LANTUS SOLOSTAR) 100 UNIT/ML Solostar Pen Inject 70 Units into the skin every morning. And pen needles 1/day 75 mL 2   insulin lispro (HUMALOG) 100 UNIT/ML injection INJECT 10 UNITS  SUBCUTANEOUSLY UP TO THREE TIMES DAILY IF BLOOD SUGAR IS GREATER THAN 150 10 mL 11   Insulin Pen Needle (B-D UF III MINI PEN NEEDLES) 31G X 5 MM MISC USE TO INJECT INSULIN DAILY 100 each 1   lipase/protease/amylase (CREON) 36000 UNITS CPEP capsule Take 2 capsules (72,000 Units total) by mouth 3 (three) times daily with meals. May also take 1 capsule (36,000 Units total) as needed (with snacks). 240 capsule 11   lisinopril (ZESTRIL) 5 MG tablet Take 1 tablet by mouth once daily 90 tablet 3   metoprolol succinate (TOPROL-XL) 25 MG 24 hr tablet Take 1 tablet (25 mg total) by mouth daily. 90 tablet 3   Multiple Vitamins-Minerals (CENTRUM SILVER ULTRA MENS PO) Take 1 tablet by mouth daily.      pantoprazole (PROTONIX) 40 MG tablet Take 1 tablet by mouth once daily 90 tablet 3   rosuvastatin (CRESTOR) 40 MG tablet TAKE 1 TABLET(40 MG) BY MOUTH AT BEDTIME 30 tablet 6   No current facility-administered medications on file prior to visit.   Allergies  Allergen Reactions   Lipitor [Atorvastatin] Other (See Comments)    Muscle weakness   Metformin And Related Diarrhea    Severe diarrhea   Family History  Problem Relation Age of Onset   Diabetes Mother    Diabetes Paternal Grandmother    Diabetes Maternal Grandfather    Colon cancer Neg Hx     PE: BP 120/80 (BP Location: Left Arm, Patient Position: Sitting, Cuff Size: Small)   Pulse 87   Ht 6' (1.829 m)   Wt 219 lb (99.3 kg)   SpO2 99%   BMI 29.70 kg/m  Wt Readings from Last 3 Encounters:  10/06/22 219 lb (99.3 kg)  10/04/22 215 lb (97.5 kg)  04/29/22 222 lb 9.6 oz (101 kg)   Constitutional: overweight, in NAD Eyes:  EOMI, no exophthalmos ENT: no neck masses, no cervical lymphadenopathy Cardiovascular: RRR, No MRG Respiratory: CTA B Musculoskeletal: no deformities except for left transmetatarsal amputation -foot in brace Skin:no rashes Neurological: no tremor with outstretched hands  ASSESSMENT: 1. DM due to pancreatic  insufficiency in the setting of gallstone pancreatitis, insulin-dependent, uncontrolled, with complications - CAD, h/o STEMI (LAD), h/o stent - Ischemic cardiomyopathy - CHF - DR - PN - s/p transmetatarsal amputation L foot  2. HL  PLAN:  1. Patient with long-standing, uncontrolled  diabetes, on basal/bolus insulin regimen, with still poor control.  Latest HbA1c, 5 months ago was 9.1%.  At today's visit, HbA1c is 8.9%, slightly lower. -At today's visit, sugars are very fluctuating, between 60s and 500s, consistent with insulin deficiency.  The low blood sugars happen mostly at night or early in the morning.  Therefore, I advised him to try to reduce the Lantus dose to 60 units daily.  Reviewing how he is taking his NovoLog, he only takes it if the sugars are high before a meal.  He takes between 5 to 10 units.  At today's visit, we discussed he needs to take mealtime insulin, regardless of the sugars before the meal.  I did advise him to take between 8 and 12 units, depending on the size and consistency of his meals.  We also discussed about the optimal lag time to inject the mealtime insulin, 15 minutes before each meal.  He usually eats twice a day, but he has irregular meals.  We discussed that ideally he would have consistent mealtimes. -We discussed about the possibility of adding another medication.  He would not want to add anything that can increase his risk of pancreatitis.  At next visit, I am hoping we can add an SGLT2 inhibitor, which I explained can help not only with diabetes but also with cardiovascular and renal outcomes. -At today's visit, I recommended a CGM.  His sister has one and she is very happy with it.  We discussed about how this works and where and how to apply it.  I sent a prescription for this to his pharmacy.  He has [employer] his primary insurance. - I suggested to:  Patient Instructions  Please change: - Lantus 60 units  in am  Use: - NovoLog 8-12  units 3x a day, 15 min before meals  Start the CGM.  Please return for another visit in 3 months.   - check sugars at different times of the day - check 4x a day, rotating checks - discussed about CBG targets for treatment: 80-130 mg/dL before meals and <180 mg/dL after meals; target HbA1c <7%. - given sugar log and advised how to fill it and to bring it at next appt  - given foot care handout  - given instructions for hypoglycemia management "15-15 rule"  - advised for yearly eye exams  - Return to clinic in 3-4 months  2. HL - Reviewed latest lipid panel from last year: Fractions at goal with exception of a low HDL: Lab Results  Component Value Date   CHOL 89 07/09/2021   HDL 32.40 (L) 07/09/2021   LDLCALC 41 07/09/2021   TRIG 81.0 07/09/2021   CHOLHDL 3 07/09/2021  - Continues Crestor 40 mg daily without side effects. -Will check a lipid panel at next visit  - Total time spent for the visit: 40 min, in precharting, postcharting , reviewing Dr. Cordelia Pen last note, obtaining medical information from the chart and from the pt. And his sister, reviewing his  previous labs, evaluations, and treatments, reviewing his symptoms, counseling him about his diabetes (please see the discussed topics above), and developing a plan to further treat it; he and his sister had a number of questions which I addressed.  Philemon Kingdom, MD PhD [facility] Endocrinology

## 2022-10-11 ENCOUNTER — Ambulatory Visit (INDEPENDENT_AMBULATORY_CARE_PROVIDER_SITE_OTHER): Payer: Medicare Other | Admitting: Family Medicine

## 2022-10-11 ENCOUNTER — Encounter: Payer: Self-pay | Admitting: Family Medicine

## 2022-10-11 VITALS — BP 136/78 | HR 84 | Temp 97.9°F | Ht 72.0 in | Wt 218.6 lb

## 2022-10-11 DIAGNOSIS — R399 Unspecified symptoms and signs involving the genitourinary system: Secondary | ICD-10-CM

## 2022-10-11 DIAGNOSIS — K8689 Other specified diseases of pancreas: Secondary | ICD-10-CM | POA: Diagnosis not present

## 2022-10-11 DIAGNOSIS — M545 Low back pain, unspecified: Secondary | ICD-10-CM | POA: Diagnosis not present

## 2022-10-11 DIAGNOSIS — G8929 Other chronic pain: Secondary | ICD-10-CM

## 2022-10-11 DIAGNOSIS — I5042 Chronic combined systolic (congestive) and diastolic (congestive) heart failure: Secondary | ICD-10-CM | POA: Diagnosis not present

## 2022-10-11 DIAGNOSIS — E089 Diabetes mellitus due to underlying condition without complications: Secondary | ICD-10-CM

## 2022-10-11 MED ORDER — METOPROLOL SUCCINATE ER 25 MG PO TB24: 25.0000 mg | ORAL_TABLET | Freq: Every day | ORAL | 3 refills | Status: DC

## 2022-10-11 NOTE — Patient Instructions (Signed)
Back Exercises ?The following exercises strengthen the muscles that help to support the trunk (torso) and back. They also help to keep the lower back flexible. Doing these exercises can help to prevent or lessen existing low back pain. ?If you have back pain or discomfort, try doing these exercises 2-3 times each day or as told by your health care provider. ?As your pain improves, do them once each day, but increase the number of times that you repeat the steps for each exercise (do more repetitions). ?To prevent the recurrence of back pain, continue to do these exercises once each day or as told by your health care provider. ?Do exercises exactly as told by your health care provider and adjust them as directed. It is normal to feel mild stretching, pulling, tightness, or discomfort as you do these exercises, but you should stop right away if you feel sudden pain or your pain gets worse. ?Exercises ?Single knee to chest ?Repeat these steps 3-5 times for each leg: ?Lie on your back on a firm bed or the floor with your legs extended. ?Bring one knee to your chest. Your other leg should stay extended and in contact with the floor. ?Hold your knee in place by grabbing your knee or thigh with both hands and hold. ?Pull on your knee until you feel a gentle stretch in your lower back or buttocks. ?Hold the stretch for 10-30 seconds. ?Slowly release and straighten your leg. ? ?Pelvic tilt ?Repeat these steps 5-10 times: ?Lie on your back on a firm bed or the floor with your legs extended. ?Bend your knees so they are pointing toward the ceiling and your feet are flat on the floor. ?Tighten your lower abdominal muscles to press your lower back against the floor. This motion will tilt your pelvis so your tailbone points up toward the ceiling instead of pointing to your feet or the floor. ?With gentle tension and even breathing, hold this position for 5-10 seconds. ? ?Cat-cow ?Repeat these steps until your lower back becomes  more flexible: ?Get into a hands-and-knees position on a firm bed or the floor. Keep your hands under your shoulders, and keep your knees under your hips. You may place padding under your knees for comfort. ?Let your head hang down toward your chest. Contract your abdominal muscles and point your tailbone toward the floor so your lower back becomes rounded like the back of a cat. ?Hold this position for 5 seconds. ?Slowly lift your head, let your abdominal muscles relax, and point your tailbone up toward the ceiling so your back forms a sagging arch like the back of a cow. ?Hold this position for 5 seconds. ? ?Press-ups ?Repeat these steps 5-10 times: ?Lie on your abdomen (face-down) on a firm bed or the floor. ?Place your palms near your head, about shoulder-width apart. ?Keeping your back as relaxed as possible and keeping your hips on the floor, slowly straighten your arms to raise the top half of your body and lift your shoulders. Do not use your back muscles to raise your upper torso. You may adjust the placement of your hands to make yourself more comfortable. ?Hold this position for 5 seconds while you keep your back relaxed. ?Slowly return to lying flat on the floor. ? ?Bridges ?Repeat these steps 10 times: ?Lie on your back on a firm bed or the floor. ?Bend your knees so they are pointing toward the ceiling and your feet are flat on the floor. Your arms should be flat   at your sides, next to your body. ?Tighten your buttocks muscles and lift your buttocks off the floor until your waist is at almost the same height as your knees. You should feel the muscles working in your buttocks and the back of your thighs. If you do not feel these muscles, slide your feet 1-2 inches (2.5-5 cm) farther away from your buttocks. ?Hold this position for 3-5 seconds. ?Slowly lower your hips to the starting position, and allow your buttocks muscles to relax completely. ?If this exercise is too easy, try doing it with your arms  crossed over your chest. ?Abdominal crunches ?Repeat these steps 5-10 times: ?Lie on your back on a firm bed or the floor with your legs extended. ?Bend your knees so they are pointing toward the ceiling and your feet are flat on the floor. ?Cross your arms over your chest. ?Tip your chin slightly toward your chest without bending your neck. ?Tighten your abdominal muscles and slowly raise your torso high enough to lift your shoulder blades a tiny bit off the floor. Avoid raising your torso higher than that because it can put too much stress on your lower back and does not help to strengthen your abdominal muscles. ?Slowly return to your starting position. ? ?Back lifts ?Repeat these steps 5-10 times: ?Lie on your abdomen (face-down) with your arms at your sides, and rest your forehead on the floor. ?Tighten the muscles in your legs and your buttocks. ?Slowly lift your chest off the floor while you keep your hips pressed to the floor. Keep the back of your head in line with the curve in your back. Your eyes should be looking at the floor. ?Hold this position for 3-5 seconds. ?Slowly return to your starting position. ? ?Contact a health care provider if: ?Your back pain or discomfort gets much worse when you do an exercise. ?Your worsening back pain or discomfort does not lessen within 2 hours after you exercise. ?If you have any of these problems, stop doing these exercises right away. Do not do them again unless your health care provider says that you can. ?Get help right away if: ?You develop sudden, severe back pain. If this happens, stop doing the exercises right away. Do not do them again unless your health care provider says that you can. ?This information is not intended to replace advice given to you by your health care provider. Make sure you discuss any questions you have with your health care provider. ?Document Revised: 03/10/2021 Document Reviewed: 11/26/2020 ?Elsevier Patient Education ? 2023 [employer] ? ?

## 2022-10-11 NOTE — Progress Notes (Signed)
[facility] [address] [place] [address] Dept: [phone] Dept Fax: [phone]  Office Visit  Subjective:    Patient ID: David Lane, male    DOB: [birth year], 77 y.o..   MRN: [id number]  Chief Complaint  Patient presents with   Acute Visit    C/o having back pain x 6 months. No OTC meds taken.     History of Present Illness:  Patient is in today for evaluation of low back pain. Mr. Suydam notes he has had intermittent issues with this over the past 6 months. He states it does not bother him every day. When flared, he feels a tightness in a band across his lower back. He does not have any radiation down the buttocks or legs. He has no new areas of numbness or tingling in the legs. This does limit his walking on days when it is flared up. He has also noted some episodic issues with getting up to urinate frequently at night. He wondered if she could have a bladder infection or kidney issue causing his pain.  Past Medical History: Patient Active Problem List   Diagnosis Date Noted   Diabetic retinopathy ([place]) 07/09/2021   Lower urinary tract symptoms (LUTS) 07/09/2021   Onychomycosis 03/17/2021   Skin pimple 03/17/2021   Coronary artery disease involving native coronary artery of native heart without angina pectoris 06/21/2019   AVM (arteriovenous malformation) of colon    History of colonic polyps    Duodenitis    Gastroesophageal reflux disease with esophagitis    Acute upper GI bleed 04/23/2019   Labile blood glucose    Functional gait disorder 01/20/2018   Acute blood loss anemia 01/20/2018   Status post transmetatarsal amputation of left foot ([place]) 01/20/2018   Status post coronary artery stent placement    Acute ST elevation myocardial infarction (STEMI) involving left anterior descending (LAD) coronary artery ([place]) 12/20/2017   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 ([place]) 12/14/2015    Pancreatic insufficiency 01/25/2013   Ischemic cardiomyopathy, EF 50% by echo 08/01/13 07/19/2012   Chronic combined systolic and diastolic CHF (congestive heart failure) ([place]) 07/14/2012   Pulmonary edema, most likely due to diastolic dysfunction 94/76/5465   CAD, 07/14/12- LAD/PDA DES    Anxiety 12/12/2009   Hyperlipidemia 04/24/2007   Essential hypertension 04/24/2007   History of pancreatitis 04/24/2007   Past Surgical History:  Procedure Laterality Date   AMPUTATION Left 01/14/2018   Procedure: TRANSMETATARSAL AMPUTATION;  Surgeon: Newt Minion, MD;  Location: [facility];  Service: Orthopedics;  Laterality: Left;   BIOPSY  04/26/2019   Procedure: BIOPSY;  Surgeon: Irving Copas., MD;  Location: [facility];  Service: Gastroenterology;;   CARDIAC CATHETERIZATION  2011   minimal disease, medical management   COLONOSCOPY WITH PROPOFOL N/A 04/26/2019   Procedure: COLONOSCOPY WITH PROPOFOL;  Surgeon: Rush Landmark Telford Nab., MD;  Location: [facility];  Service: Gastroenterology;  Laterality: N/A;   CORONARY ANGIOPLASTY WITH STENT PLACEMENT  07/14/2012   successful PCI & stenting of mid LAD & PDA off the dominant CX   CORONARY/GRAFT ACUTE MI REVASCULARIZATION N/A 12/20/2017   Procedure: Coronary/Graft Acute MI Revascularization;  Surgeon: Martinique, Peter M, MD;  Location: [facility];  Service: Cardiovascular;  Laterality: N/A;   ENTEROSCOPY N/A 04/25/2019   Procedure: ENTEROSCOPY;  Surgeon: Rush Landmark Telford Nab., MD;  [facility];  Service: Gastroenterology;  Laterality: N/A;   ESOPHAGOGASTRODUODENOSCOPY Left 04/24/2019   Procedure: ESOPHAGOGASTRODUODENOSCOPY (EGD);  Surgeon:  Cirigliano, Dominic Pea, DO;  [facility]: [facility];  Service: Gastroenterology;  Laterality: Left;   HEMOSTASIS CLIP PLACEMENT  04/26/2019   Procedure: HEMOSTASIS CLIP PLACEMENT;  Surgeon: Irving Copas., MD;  [facility];  Service: Gastroenterology;;   HOT HEMOSTASIS N/A  04/26/2019   Procedure: HOT HEMOSTASIS (ARGON PLASMA COAGULATION/BICAP);  Surgeon: Irving Copas., MD;  Location: [facility];  Service: Gastroenterology;  Laterality: N/A;   INCISION AND DRAINAGE ABSCESS ANAL  1970's   LEFT HEART CATH AND CORONARY ANGIOGRAPHY N/A 12/20/2017   Procedure: LEFT HEART CATH AND CORONARY ANGIOGRAPHY;  Surgeon: Martinique, Peter M, MD;  Location: [facility];  Service: Cardiovascular;  Laterality: N/A;   LEFT HEART CATHETERIZATION WITH CORONARY ANGIOGRAM N/A 07/14/2012   Procedure: LEFT HEART CATHETERIZATION WITH CORONARY ANGIOGRAM;  Surgeon: [name], MD;  Location: [facility];  Service: Cardiovascular;  Laterality: N/A;   PERCUTANEOUS CORONARY STENT INTERVENTION (PCI-S) Right 07/14/2012   Procedure: PERCUTANEOUS CORONARY STENT INTERVENTION (PCI-S);  Surgeon: [name], MD;  Location: [facility];  Service: Cardiovascular;  Laterality: Right;   POLYPECTOMY  04/26/2019   Procedure: POLYPECTOMY;  Surgeon: Mansouraty, Telford Nab., MD;  Location: [facility];  Service: Gastroenterology;;   SUBMUCOSAL TATTOO INJECTION  04/25/2019   Procedure: SUBMUCOSAL TATTOO INJECTION;  Surgeon: Irving Copas., MD;  [facility];  Service: Gastroenterology;;   Family History  Problem Relation Age of Onset   Diabetes Mother    Diabetes Paternal Grandmother    Diabetes Maternal Grandfather    Colon cancer Neg Hx    Outpatient Medications Prior to Visit  Medication Sig Dispense Refill   Accu-Chek FastClix Lancets MISC 1 Units by Does not apply route daily. 100 each 2   Accu-Chek Softclix Lancets lancets 1 each by Other route as directed. Use as instructed 100 each 2   aspirin 81 MG tablet Take 1 tablet (81 mg total) by mouth daily. 30 tablet    Cholecalciferol (VITAMIN D3) 50 MCG (2000 UT) TABS Take 1 tablet by mouth daily.     Continuous Blood Gluc Receiver (FREESTYLE LIBRE 2 READER) DEVI 1 each by Does not apply route daily. 1 each 3    Continuous Blood Gluc Sensor (FREESTYLE LIBRE 2 SENSOR) MISC 1 each by Does not apply route every 14 (fourteen) days. 6 each 3   escitalopram (LEXAPRO) 10 MG tablet TAKE 1 TABLET(5 MG) BY MOUTH DAILY 90 tablet 3   glucose blood (ONETOUCH ULTRA) test strip USE 1 STRIP TO CHECK GLUCOSE 4 TIMES DAILY. 150 each 2   insulin aspart (NOVOLOG) 100 UNIT/ML injection Inject 10 Units into the skin 3 (three) times daily before meals.     insulin glargine (LANTUS SOLOSTAR) 100 UNIT/ML Solostar Pen Inject 60 Units into the skin every morning. And pen needles 1/day 75 mL 2   Insulin Pen Needle (B-D UF III MINI PEN NEEDLES) 31G X 5 MM MISC USE TO INJECT INSULIN DAILY 100 each 1   lipase/protease/amylase (CREON) 36000 UNITS CPEP capsule Take 2 capsules (72,000 Units total) by mouth 3 (three) times daily with meals. May also take 1 capsule (36,000 Units total) as needed (with snacks). 240 capsule 11   lisinopril (ZESTRIL) 5 MG tablet Take 1 tablet by mouth once daily 90 tablet 3   Multiple Vitamins-Minerals (CENTRUM SILVER ULTRA MENS PO) Take 1 tablet by mouth daily.      pantoprazole (PROTONIX) 40 MG tablet Take 1 tablet by mouth once daily 90 tablet 3   rosuvastatin (CRESTOR)  40 MG tablet TAKE 1 TABLET(40 MG) BY MOUTH AT BEDTIME 30 tablet 6   metoprolol succinate (TOPROL-XL) 25 MG 24 hr tablet Take 1 tablet (25 mg total) by mouth daily. (Patient not taking: Reported on 10/11/2022) 90 tablet 3   No facility-administered medications prior to visit.   Allergies  Allergen Reactions   Lipitor [Atorvastatin] Other (See Comments)    Muscle weakness   Metformin And Related Diarrhea    Severe diarrhea     Objective:   Today's Vitals   10/11/22 1439  BP: 136/78  Pulse: 84  Temp: 97.9 F (36.6 C)  TempSrc: Temporal  SpO2: 94%  Weight: 218 lb 9.6 oz (99.2 kg)  Height: 6' (1.829 m)   Body mass index is 29.65 kg/m.   General: Well developed, well nourished. No acute distress. Back: Straight. Pain indicated  as occurring in a band over the lower lumbar area. No CVA tenderness. Extremities: Full ROM. SLR -. Strength 5/5 except for left ankle flexors (prior amputation of mid foot). Neuro: Normal sensation and DTR 1+ bilaterally. Psych: Alert and oriented. Normal mood and affect.  Health Maintenance Due  Topic Date Due   OPHTHALMOLOGY EXAM  Never done   Hepatitis C Screening  Never done   Zoster Vaccines- Shingrix (1 of 2) Never done   Pneumonia Vaccine 13+ Years old (2 - PPSV23 or PCV20) 12/05/2015   Diabetic kidney evaluation - eGFR measurement  07/09/2022   Diabetic kidney evaluation - Urine ACR  07/09/2022     Assessment & Plan:   1. Chronic bilateral low back pain without sciatica This is likely a musculoskeletal issue. I will send him to get an x-ray to evaluate this. We discussed use of heat and stretches. I provided him a handout of back stretching exercises. I also do recommend that he try and do more walking. He should avoid use of NSAIDs due to his history fo CHF.  - DG Lumbar Spine Complete; Future  2. Chronic combined systolic and diastolic CHF (congestive heart failure) ([place]) Compensated. For some reason, he no longer had metoprolol to take. I recommend we restart this.  - metoprolol succinate (TOPROL-XL) 25 MG 24 hr tablet; Take 1 tablet (25 mg total) by mouth daily.  Dispense: 90 tablet; Refill: 3  3. 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 [facility]) He has established with Dr. Cruzita Lederer for management of his diabetes. He is due for annual screening for kidney disease.  - Basic metabolic panel - Microalbumin / creatinine urine ratio - Urinalysis, Routine w reflex microscopic  4. Lower urinary tract symptoms (LUTS) I suspect the urinary frequency is due to his known LUTS. However, I will check a UA to rule out infection. I his diabetes control is worse, he could be urinating more due to this.   Return if symptoms worsen or fail to improve.   Haydee Salter,  MD

## 2022-10-12 LAB — BASIC METABOLIC PANEL
BUN: 26 mg/dL — ABNORMAL HIGH (ref 6–23)
CO2: 29 mEq/L (ref 19–32)
Calcium: 9.2 mg/dL (ref 8.4–10.5)
Chloride: 101 mEq/L (ref 96–112)
Creatinine, Ser: 1.35 mg/dL (ref 0.40–1.50)
GFR: 51.87 mL/min — ABNORMAL LOW (ref >60.00)
Glucose, Bld: 140 mg/dL — ABNORMAL HIGH (ref 70–99)
Potassium: 4.5 mEq/L (ref 3.5–5.1)
Sodium: 140 mEq/L (ref 135–145)

## 2022-10-12 LAB — URINALYSIS, ROUTINE W REFLEX MICROSCOPIC
Hgb urine dipstick: NEGATIVE
Ketones, ur: 15 — AB
Leukocytes,Ua: NEGATIVE
Nitrite: NEGATIVE
RBC / HPF: NONE SEEN (ref 0–?)
Specific Gravity, Urine: 1.025 (ref 1.000–1.030)
Total Protein, Urine: NEGATIVE
Urine Glucose: 100 — AB
Urobilinogen, UA: 0.2 (ref 0.0–1.0)
pH: 5.5 (ref 5.0–8.0)

## 2022-10-12 LAB — MICROALBUMIN / CREATININE URINE RATIO
Creatinine,U: 82.7 mg/dL
Microalb Creat Ratio: 0.8 mg/g (ref 0.0–30.0)
Microalb, Ur: <0.7 mg/dL (ref 0.0–1.9)

## 2022-11-05 ENCOUNTER — Other Ambulatory Visit: Payer: Self-pay | Admitting: Family Medicine

## 2022-11-05 DIAGNOSIS — K2100 Gastro-esophageal reflux disease with esophagitis, without bleeding: Secondary | ICD-10-CM

## 2022-12-14 ENCOUNTER — Other Ambulatory Visit: Payer: Self-pay | Admitting: Family Medicine

## 2022-12-14 DIAGNOSIS — E089 Diabetes mellitus due to underlying condition without complications: Secondary | ICD-10-CM

## 2022-12-15 ENCOUNTER — Other Ambulatory Visit: Payer: Self-pay

## 2023-01-06 ENCOUNTER — Ambulatory Visit: Payer: Medicare Other | Admitting: Internal Medicine

## 2023-01-16 ENCOUNTER — Other Ambulatory Visit: Payer: Self-pay | Admitting: Family Medicine

## 2023-01-16 DIAGNOSIS — K8689 Other specified diseases of pancreas: Secondary | ICD-10-CM

## 2023-02-03 ENCOUNTER — Encounter: Payer: Self-pay | Admitting: Internal Medicine

## 2023-02-03 ENCOUNTER — Ambulatory Visit: Payer: Medicare Other | Admitting: Internal Medicine

## 2023-02-03 VITALS — BP 130/68 | HR 67 | Ht 72.0 in | Wt 221.2 lb

## 2023-02-03 DIAGNOSIS — E0859 Diabetes mellitus due to underlying condition with other circulatory complications: Secondary | ICD-10-CM | POA: Diagnosis not present

## 2023-02-03 DIAGNOSIS — E7849 Other hyperlipidemia: Secondary | ICD-10-CM | POA: Diagnosis not present

## 2023-02-03 DIAGNOSIS — E0865 Diabetes mellitus due to underlying condition with hyperglycemia: Secondary | ICD-10-CM | POA: Diagnosis not present

## 2023-02-03 DIAGNOSIS — E089 Diabetes mellitus due to underlying condition without complications: Secondary | ICD-10-CM

## 2023-02-03 DIAGNOSIS — E119 Type 2 diabetes mellitus without complications: Secondary | ICD-10-CM

## 2023-02-03 DIAGNOSIS — K8689 Other specified diseases of pancreas: Secondary | ICD-10-CM | POA: Diagnosis not present

## 2023-02-03 DIAGNOSIS — E1159 Type 2 diabetes mellitus with other circulatory complications: Secondary | ICD-10-CM

## 2023-02-03 LAB — POCT GLYCOSYLATED HEMOGLOBIN (HGB A1C): Hemoglobin A1C: 8.4 % — AB (ref 4.0–5.6)

## 2023-02-03 MED ORDER — BD PEN NEEDLE MINI U/F 31G X 5 MM MISC: 3 refills | Status: DC

## 2023-02-03 NOTE — Progress Notes (Signed)
Patient ID: DESHAWN BROOM, male   DOB: Aug 14, 1946, 77 y.o.   MRN: [id number]  HPI: PENROSE ROTTA is a 77 y.o.-year-old maleyear-old male, returning for follow-up for DM2, dx in 1988 during an episode of gallstone pancreatitis, insulin-dependent since 1990, uncontrolled, with complications (CAD - s/p AMI, CHF, DR, CKD, PN). Pt. previously saw Dr. Everardo All, but last visit with me 4 months ago.   Interim history: No increased urination, blurry vision, nausea, chest pain. He was able to start the CGM since last visit.  He likes it.  Reviewed HbA1c: Lab Results  Component Value Date   HGBA1C 8.9 (A) 10/06/2022   HGBA1C 9.4 (H) 04/29/2022   HGBA1C 8.9 (H) 01/07/2022   HGBA1C 8.8 (H) 10/09/2021   HGBA1C 9.4 (H) 07/09/2021   HGBA1C 8.2 (A) 11/20/2020   HGBA1C 8.8 (A) 08/14/2020   HGBA1C 8.1 (A) 08/27/2019   HGBA1C 8.2 (A) 06/12/2019   HGBA1C 9.1 (A) 03/29/2019   Pt is on a regimen of: - Lantus 70 >> 60 units in am - Novolog 5-10 units 3x a day, before meals (vials) >> 8-12 units 3x a day, 15 min before meals (actually using only 10 units) He tried Metformin >> fatigue and weakness.  Pt checks his sugars >4x a day and they are:  Prev.: - am: 61-299, 503 - 2h after b'fast: n/c - before lunch: n/c - 2h after lunch: 170-384 - before dinner: n/c - 2h after dinner: n/c - bedtime: 70-541 - nighttime: n/c Lowest sugar was 61 >> 50; he has hypoglycemia awareness at 100.  Highest sugar was 541 >> HI.  Glucometer: Accu-Chek/One Touch ultra  - + Mild CKD, last BUN/creatinine:  Lab Results  Component Value Date   BUN 26 (H) 10/11/2022   BUN 24 (H) 07/09/2021   CREATININE 1.35 10/11/2022   CREATININE 1.12 07/09/2021  , Lisinopril 5 mg  -+ HL; last set of lipids: Lab Results  Component Value Date   CHOL 89 07/09/2021   HDL 32.40 (L) 07/09/2021   LDLCALC 41 07/09/2021   TRIG 81.0 07/09/2021   CHOLHDL 3 07/09/2021  On Crestor 40 mg daily.  - last eye exam was in 2021-2022. + DR - had to  have IO injections. Had cataract surgery in the last year.  - no numbness and tingling in his feet.  Last foot exam 01/07/2022.  He also has a history of HTN, GERD, anxiety, onychomycosis.  ROS: + see HPI Past Medical History:  Diagnosis Date   CAD (coronary artery disease)    last cath by [name] DR.  Mihai Croitoru showing  some  disease involving LCX and small size of Diag    CHF exacerbation, due to diastolic dysfunction 07/17/2012   Chronic systolic CHF (congestive heart failure), NYHA class 1 (HCC) 07/17/2012   GERD (gastroesophageal reflux disease)    Hepatic lesion 02/04/11   Hyperlipemia    Hypertension    Ischemic cardiomyopathy    EF 35-40%   Liver hemangioma    NSTEMI (non-ST elevated myocardial infarction) (HCC) 11/21/2009   Pancreatitis 2000's   Shortness of breath    Type II diabetes mellitus ([facility])    Past Surgical History:  Procedure Laterality Date   AMPUTATION Left 01/14/2018   Procedure: TRANSMETATARSAL AMPUTATION;  Surgeon: Nadara Mustard, MD;  Location: [facility];  Service: Orthopedics;  Laterality: Left;   BIOPSY  04/26/2019   Procedure: BIOPSY;  Surgeon: Meridee Score Netty Starring., MD;  Location: [facility];  Service: Gastroenterology;;   CARDIAC CATHETERIZATION  2011   minimal disease, medical management   COLONOSCOPY WITH PROPOFOL N/A 04/26/2019   Procedure: COLONOSCOPY WITH PROPOFOL;  Surgeon: Meridee Score Netty Starring., MD;  Location: [facility];  Service: Gastroenterology;  Laterality: N/A;   CORONARY ANGIOPLASTY WITH STENT PLACEMENT  07/14/2012   successful PCI & stenting of mid LAD & PDA off the dominant CX   CORONARY/GRAFT ACUTE MI REVASCULARIZATION N/A 12/20/2017   Procedure: Coronary/Graft Acute MI Revascularization;  Surgeon: Swaziland, Peter M, MD;  Location: [facility];  Service: Cardiovascular;  Laterality: N/A;   ENTEROSCOPY N/A 04/25/2019   Procedure: ENTEROSCOPY;  Surgeon: Meridee Score Netty Starring., MD;  Location[facility];  Service:  Gastroenterology;  Laterality: N/A;   ESOPHAGOGASTRODUODENOSCOPY Left 04/24/2019   Procedure: ESOPHAGOGASTRODUODENOSCOPY (EGD);  Surgeon: Shellia Cleverly, DO;  Location: [facility];  Service: Gastroenterology;  Laterality: Left;   HEMOSTASIS CLIP PLACEMENT  04/26/2019   Procedure: HEMOSTASIS CLIP PLACEMENT;  Surgeon: Lemar Lofty., MD;  Location[facility];  Service: Gastroenterology;;   HOT HEMOSTASIS N/A 04/26/2019   Procedure: HOT HEMOSTASIS (ARGON PLASMA COAGULATION/BICAP);  Surgeon: Lemar Lofty., MD;  Location: [facility];  Service: Gastroenterology;  Laterality: N/A;   INCISION AND DRAINAGE ABSCESS ANAL  1970's   LEFT HEART CATH AND CORONARY ANGIOGRAPHY N/A 12/20/2017   Procedure: LEFT HEART CATH AND CORONARY ANGIOGRAPHY;  Surgeon: Swaziland, Peter M, MD;  Location: [facility] LAB;  Service: Cardiovascular;  Laterality: N/A;   LEFT HEART CATHETERIZATION WITH CORONARY ANGIOGRAM N/A 07/14/2012   Procedure: LEFT HEART CATHETERIZATION WITH CORONARY ANGIOGRAM;  Surgeon: Thurmon Fair, MD;  Location: [facility];  Service: Cardiovascular;  Laterality: N/A;   PERCUTANEOUS CORONARY STENT INTERVENTION (PCI-S) Right 07/14/2012   Procedure: PERCUTANEOUS CORONARY STENT INTERVENTION (PCI-S);  Surgeon: Thurmon Fair, MD;  Location[facility];  Service: Cardiovascular;  Laterality: Right;   POLYPECTOMY  04/26/2019   Procedure: POLYPECTOMY;  Surgeon: Mansouraty, Netty Starring., MD;  Location: [facility];  Service: Gastroenterology;;   SUBMUCOSAL TATTOO INJECTION  04/25/2019   Procedure: SUBMUCOSAL TATTOO INJECTION;  Surgeon: Lemar Lofty., MD;  Location: [facility];  Service: Gastroenterology;;   Social History   Socioeconomic History   Marital status: Single    Spouse name: Not on file   Number of children: 0   Years of education: Not on file   Highest education level: Not on file  Occupational History   Occupation: retired    Associate Professor: [employer]   Tobacco Use   Smoking status: Never   Smokeless tobacco: Never  Vaping Use   Vaping Use: Never used  Substance and Sexual Activity   Alcohol use: No   Drug use: No   Sexual activity: Not Currently  Other Topics Concern   Not on file  Social History Narrative   Not on file   Social Determinants of Health   Financial Resource Strain: Low Risk  (10/04/2022)   Overall Financial Resource Strain (CARDIA)    Difficulty of Paying Living Expenses: Not hard at all  Food Insecurity: No Food Insecurity (10/04/2022)   Hunger Vital Sign    Worried About Running Out of Food in the Last Year: Never true    Ran Out of Food in the Last Year: Never true  Transportation Needs: No Transportation Needs (10/04/2022)   PRAPARE - Administrator, Civil Service (Medical): No    Lack of Transportation (Non-Medical): No  Physical Activity: Inactive (10/04/2022)   Exercise Vital Sign    Days of Exercise per  Week: 0 days    Minutes of Exercise per Session: 0 min  Stress: No Stress Concern Present (10/04/2022)   [employer] of Occupational Health - Occupational Stress Questionnaire    Feeling of Stress : Not at all  Social Connections: Not on file  Intimate Partner Violence: Not on file   Current Outpatient Medications on File Prior to Visit  Medication Sig Dispense Refill   Accu-Chek FastClix Lancets MISC 1 Units by Does not apply route daily. 100 each 2   Accu-Chek Softclix Lancets lancets 1 each by Other route as directed. Use as instructed 100 each 2   aspirin 81 MG tablet Take 1 tablet (81 mg total) by mouth daily. 30 tablet    Cholecalciferol (VITAMIN D3) 50 MCG (2000 UT) TABS Take 1 tablet by mouth daily.     Continuous Blood Gluc Receiver (FREESTYLE LIBRE 2 READER) DEVI 1 each by Does not apply route daily. 1 each 3   Continuous Blood Gluc Sensor (FREESTYLE LIBRE 2 SENSOR) MISC 1 each by Does not apply route every 14 (fourteen) days. 6 each 3   escitalopram (LEXAPRO) 10 MG tablet TAKE 1  TABLET(5 MG) BY MOUTH DAILY 90 tablet 3   insulin aspart (NOVOLOG) 100 UNIT/ML injection Inject 10 Units into the skin 3 (three) times daily before meals.     Insulin Pen Needle (B-D UF III MINI PEN NEEDLES) 31G X 5 MM MISC USE TO INJECT INSULIN DAILY 100 each 1   LANTUS SOLOSTAR 100 UNIT/ML Solostar Pen Inject 60 Units into the skin daily. 75 mL 3   lipase/protease/amylase (CREON) 36000 UNITS CPEP capsule Take 2 capsules (72,000 Units total) by mouth 3 (three) times daily with meals. May also take 1 capsule (36,000 Units total) as needed (with snacks). 240 capsule 11   lisinopril (ZESTRIL) 5 MG tablet Take 1 tablet by mouth once daily 90 tablet 3   metoprolol succinate (TOPROL-XL) 25 MG 24 hr tablet Take 1 tablet (25 mg total) by mouth daily. 90 tablet 3   Multiple Vitamins-Minerals (CENTRUM SILVER ULTRA MENS PO) Take 1 tablet by mouth daily.      ONETOUCH ULTRA TEST test strip USE 1 STRIP TO CHECK GLUCOSE 4 TIMES DAILY 150 each 0   pantoprazole (PROTONIX) 40 MG tablet Take 1 tablet by mouth once daily 90 tablet 0   rosuvastatin (CRESTOR) 40 MG tablet TAKE 1 TABLET(40 MG) BY MOUTH AT BEDTIME 30 tablet 6   No current facility-administered medications on file prior to visit.   Allergies  Allergen Reactions   Lipitor [Atorvastatin] Other (See Comments)    Muscle weakness   Metformin And Related Diarrhea    Severe diarrhea   Family History  Problem Relation Age of Onset   Diabetes Mother    Diabetes Paternal Grandmother    Diabetes Maternal Grandfather    Colon cancer Neg Hx    PE: There were no vitals taken for this visit. Wt Readings from Last 3 Encounters:  10/11/22 218 lb 9.6 oz (99.2 kg)  10/06/22 219 lb (99.3 kg)  10/04/22 215 lb (97.5 kg)   Constitutional: overweight, in NAD Eyes:  EOMI, no exophthalmos ENT: no neck masses, no cervical lymphadenopathy Cardiovascular: RRR, No MRG Respiratory: CTA B Musculoskeletal: no deformities except for left transmetatarsal amputation  -foot in brace Skin:no rashes Neurological: no tremor with outstretched hands  ASSESSMENT: 1. DM due to pancreatic insufficiency in the setting of gallstone pancreatitis, insulin-dependent, uncontrolled, with complications - CAD, h/o STEMI (LAD), h/o stent -  Ischemic cardiomyopathy - CHF - DR - PN - s/p transmetatarsal amputation L foot  2. HL  PLAN:  1. Patient with longstanding, uncontrolled, insulin-dependent diabetes, on basal/bolus insulin regimen, with still suboptimal control.  Latest HbA1c was slightly lower but still above target, at 8.9%.  At that time, we discussed about the possibility of adding another medication but he did not want to add anything that could increase his risk of pancreatitis.  We did discuss briefly about SGLT2 inhibitors and I explained that this can help not only with diabetes but also with cardiovascular and renal outcomes.  At last visit I also recommended a CGM.  We discussed about how it works and how to apply it.  I sent the prescription to his pharmacy.   CGM interpretation: -At today's visit, we reviewed his CGM downloads: It appears that 47% of values are in target range (goal >70%), while 52% are higher than 180 (goal <25%), and 1% are lower than 70 (goal <4%).  The calculated average blood sugar is 198.  The projected HbA1c for the next 3 months ([facility]) is 8%. -Reviewing the CGM trends, sugars appear to be still quite fluctuating, slightly better control in the second half of the night in the first half of the day and then increasing in a stepwise fashion, with the more significant increase being after dinner.  We discussed that this pattern is suggestive of not enough mealtime coverage.  Upon questioning, he is only taking 10 units of NovoLog before each meal.  He did not fluctuate the dose as recommended at last visit.  I strongly advised him to do so.  I also advised him to increase the doses and I recommended a sliding scale. -He is interested in  getting more advised along with nutrition and I gave him references. - I suggested to:  Patient Instructions  Please continue: - Lantus 60 units  in am  Change: - NovoLog 10-14 units 3x a day, 15 min before meals  Use the following sliding scale: - 150-175: + 1 unit  - 176-200: + 2 units  - 201-225: + 3 units  - 226-250: + 4 units  >250: + 5 units  Read the following books: Dr. Lequita Asal - Program for Reversing Diabetes Dr. Suzzette Righter - Prevent and Reverse Heart Disease Dr. Gerri Spore - How Not to Die  Please return for another visit in 3-4 months.   - we checked his HbA1c: 8.4% (lower) - advised to check sugars at different times of the day - 4x a day, rotating check times - advised for yearly eye exams >> he is UTD - I did not have the time to do a foot exam at today's visit - return to clinic in 3-4 months  2. HL -Reviewed latest lipid panel from 06/2021: Fractions at goal with the exception of a low HDL: Lab Results  Component Value Date   CHOL 89 07/09/2021   HDL 32.40 (L) 07/09/2021   LDLCALC 41 07/09/2021   TRIG 81.0 07/09/2021   CHOLHDL 3 07/09/2021  -Continues on Crestor 40 mg daily without side effects  Carlus Pavlov, MD PhD [facility] Endocrinology

## 2023-02-03 NOTE — Patient Instructions (Addendum)
Please continue: - Lantus 60 units  in am  Change: - NovoLog 10-14 units 3x a day, 15 min before meals  Use the following sliding scale: - 150-175: + 1 unit  - 176-200: + 2 units  - 201-225: + 3 units  - 226-250: + 4 units  >250: + 5 units  Read the following books: Dr. Lequita Asal - Program for Reversing Diabetes Dr. Suzzette Righter - Prevent and Reverse Heart Disease Dr. Gerri Spore - How Not to Die  Please return for another visit in 3-4 months.

## 2023-02-15 ENCOUNTER — Other Ambulatory Visit: Payer: Self-pay | Admitting: Family Medicine

## 2023-02-15 DIAGNOSIS — K2100 Gastro-esophageal reflux disease with esophagitis, without bleeding: Secondary | ICD-10-CM

## 2023-04-06 ENCOUNTER — Other Ambulatory Visit: Payer: Self-pay | Admitting: Family Medicine

## 2023-04-06 DIAGNOSIS — I10 Essential (primary) hypertension: Secondary | ICD-10-CM

## 2023-05-18 ENCOUNTER — Other Ambulatory Visit: Payer: Self-pay | Admitting: Family Medicine

## 2023-05-18 DIAGNOSIS — K8689 Other specified diseases of pancreas: Secondary | ICD-10-CM

## 2023-06-06 ENCOUNTER — Ambulatory Visit: Payer: Medicare Other | Admitting: Internal Medicine

## 2023-06-10 ENCOUNTER — Encounter: Payer: Self-pay | Admitting: Internal Medicine

## 2023-06-10 ENCOUNTER — Ambulatory Visit: Payer: Medicare Other | Admitting: Internal Medicine

## 2023-06-10 VITALS — BP 152/80 | HR 94 | Ht 72.0 in | Wt 224.0 lb

## 2023-06-10 DIAGNOSIS — E7849 Other hyperlipidemia: Secondary | ICD-10-CM

## 2023-06-10 DIAGNOSIS — Z794 Long term (current) use of insulin: Secondary | ICD-10-CM

## 2023-06-10 DIAGNOSIS — E1159 Type 2 diabetes mellitus with other circulatory complications: Secondary | ICD-10-CM

## 2023-06-10 DIAGNOSIS — E1165 Type 2 diabetes mellitus with hyperglycemia: Secondary | ICD-10-CM

## 2023-06-10 LAB — POCT GLYCOSYLATED HEMOGLOBIN (HGB A1C): Hemoglobin A1C: 8.6 % — AB (ref 4.0–5.6)

## 2023-06-10 LAB — LIPID PANEL
Cholesterol: 88 mg/dL (ref 0–200)
HDL: 32.5 mg/dL — ABNORMAL LOW (ref >39.00)
LDL Cholesterol: 40 mg/dL (ref 0–99)
NonHDL: 55.02
Total CHOL/HDL Ratio: 3
Triglycerides: 73 mg/dL (ref 0.0–149.0)
VLDL: 14.6 mg/dL (ref 0.0–40.0)

## 2023-06-10 NOTE — Progress Notes (Signed)
Patient ID: David Lane, male   DOB: 04/04/1946, 77 y.o.   MRN: [id number]  HPI: David Lane is a 77 y.o.-year-old male, returning for follow-up for DM2, dx in 1988 during an episode of gallstone pancreatitis, insulin-dependent since 1990, uncontrolled, with complications (CAD - s/p AMI, CHF, DR, CKD, PN). Pt. previously saw Dr. Everardo All, but last visit with me 4 months ago. His sister is also my pt.  Interim history: No increased urination, blurry vision, nausea, chest pain.  Reviewed HbA1c: Lab Results  Component Value Date   HGBA1C 8.4 (A) 02/03/2023   HGBA1C 8.9 (A) 10/06/2022   HGBA1C 9.4 (H) 04/29/2022   HGBA1C 8.9 (H) 01/07/2022   HGBA1C 8.8 (H) 10/09/2021   HGBA1C 9.4 (H) 07/09/2021   HGBA1C 8.2 (A) 11/20/2020   HGBA1C 8.8 (A) 08/14/2020   HGBA1C 8.1 (A) 08/27/2019   HGBA1C 8.2 (A) 06/12/2019   Previously on: - Lantus 70 >> 60 units in am - Novolog 5-10 units 3x a day, before meals (vials) >> 8-12 units 3x a day, 15 min before meals (actually using only 10 units) He tried Metformin >> fatigue and weakness.  Now on: - Lantus 60 units midday - NovoLog 10-14 units 3x a day, 15 min before meals - Novolog sliding scale: - 150-175: + 1 unit  - 176-200: + 2 units  - 201-225: + 3 units  - 226-250: + 4 units  >250: + 5 units Pt checks his sugars >4x a day and they are:  Previously:   Lowest sugar was 61 >> 50 >> 50s; he has hypoglycemia awareness at 100.  Highest sugar was 541 >> HI >> 400s.  Glucometer: Accu-Chek/One Touch ultra  - + Mild CKD, last BUN/creatinine:  Lab Results  Component Value Date   BUN 26 (H) 10/11/2022   BUN 24 (H) 07/09/2021   CREATININE 1.35 10/11/2022   CREATININE 1.12 07/09/2021   Lab Results  Component Value Date   MICRALBCREAT 0.8 10/11/2022   MICRALBCREAT 1.8 07/09/2021   MICRALBCREAT 2.8 11/25/2014   MICRALBCREAT 11.2 01/24/2013   MICRALBCREAT 5.1 08/03/2010   MICRALBCREAT 9.8 06/27/2009  On Lisinopril 5 mg  -+ HL;  last set of lipids: Lab Results  Component Value Date   CHOL 89 07/09/2021   HDL 32.40 (L) 07/09/2021   LDLCALC 41 07/09/2021   TRIG 81.0 07/09/2021   CHOLHDL 3 07/09/2021  On Crestor 40 mg daily.  - last eye exam was in 2021-2022. + DR - had to have IO injections. Had cataract surgery in the last year.  - no numbness and tingling in his feet.  Last foot exam 01/07/2022.  He also has a history of HTN, GERD, anxiety, onychomycosis.  ROS: + see HPI Past Medical History:  Diagnosis Date   CAD (coronary artery disease)    last cath by [facility] DR.  Mihai Croitoru showing  some  disease involving LCX and small size of Diag    CHF exacerbation, due to diastolic dysfunction 07/17/2012   Chronic systolic CHF (congestive heart failure), NYHA class 1 (HCC) 07/17/2012   GERD (gastroesophageal reflux disease)    Hepatic lesion 02/04/11   Hyperlipemia    Hypertension    Ischemic cardiomyopathy    EF 35-40%   Liver hemangioma    NSTEMI (non-ST elevated myocardial infarction) (HCC) 11/21/2009   Pancreatitis 2000's   Shortness of breath    Type II diabetes mellitus (HCC)    Past Surgical History:  Procedure Laterality Date   AMPUTATION Left  01/14/2018   Procedure: TRANSMETATARSAL AMPUTATION;  Surgeon: Nadara Mustard, MD;  Location[facility];  Service: Orthopedics;  Laterality: Left;   BIOPSY  04/26/2019   Procedure: BIOPSY;  Surgeon: Lemar Lofty., MD;  Location: [facility] ENDOSCOPY;  Service: Gastroenterology;;   CARDIAC CATHETERIZATION  2011   minimal disease, medical management   COLONOSCOPY WITH PROPOFOL N/A 04/26/2019   Procedure: COLONOSCOPY WITH PROPOFOL;  Surgeon: Meridee Score Netty Starring., MD;  Location: [facility];  Service: Gastroenterology;  Laterality: N/A;   CORONARY ANGIOPLASTY WITH STENT PLACEMENT  07/14/2012   successful PCI & stenting of mid LAD & PDA off the dominant CX   CORONARY/GRAFT ACUTE MI REVASCULARIZATION N/A 12/20/2017   Procedure: Coronary/Graft Acute MI  Revascularization;  Surgeon: Swaziland, Peter M, MD;  Location: [facility] INVASIVE CV LAB;  Service: Cardiovascular;  Laterality: N/A;   ENTEROSCOPY N/A 04/25/2019   Procedure: ENTEROSCOPY;  Surgeon: Meridee Score Netty Starring., MD;  Location: [facility] ENDOSCOPY;  Service: Gastroenterology;  Laterality: N/A;   ESOPHAGOGASTRODUODENOSCOPY Left 04/24/2019   Procedure: ESOPHAGOGASTRODUODENOSCOPY (EGD);  Surgeon: Shellia Cleverly, DO;  Location: [facility] ENDOSCOPY;  Service: Gastroenterology;  Laterality: Left;   HEMOSTASIS CLIP PLACEMENT  04/26/2019   Procedure: HEMOSTASIS CLIP PLACEMENT;  Surgeon: Lemar Lofty., MD;  Location: [facility] ENDOSCOPY;  Service: Gastroenterology;;   HOT HEMOSTASIS N/A 04/26/2019   Procedure: HOT HEMOSTASIS (ARGON PLASMA COAGULATION/BICAP);  Surgeon: Lemar Lofty., MD;  Location: [facility];  Service: Gastroenterology;  Laterality: N/A;   INCISION AND DRAINAGE ABSCESS ANAL  1970's   LEFT HEART CATH AND CORONARY ANGIOGRAPHY N/A 12/20/2017   Procedure: LEFT HEART CATH AND CORONARY ANGIOGRAPHY;  Surgeon: Swaziland, Peter M, MD;  Location: [facility] INVASIVE CV LAB;  Service: Cardiovascular;  Laterality: N/A;   LEFT HEART CATHETERIZATION WITH CORONARY ANGIOGRAM N/A 07/14/2012   Procedure: LEFT HEART CATHETERIZATION WITH CORONARY ANGIOGRAM;  Surgeon: Thurmon Fair, MD;  Location: [facility] CATH LAB;  Service: Cardiovascular;  Laterality: N/A;   PERCUTANEOUS CORONARY STENT INTERVENTION (PCI-S) Right 07/14/2012   Procedure: PERCUTANEOUS CORONARY STENT INTERVENTION (PCI-S);  Surgeon: Thurmon Fair, MD;  Location: [facility] CATH LAB;  Service: Cardiovascular;  Laterality: Right;   POLYPECTOMY  04/26/2019   Procedure: POLYPECTOMY;  Surgeon: Mansouraty, Netty Starring., MD;  Location: [facility];  Service: Gastroenterology;;   SUBMUCOSAL TATTOO INJECTION  04/25/2019   Procedure: SUBMUCOSAL TATTOO INJECTION;  Surgeon: Lemar Lofty., MD;  Location: [facility];  Service: Gastroenterology;;   Social History    Socioeconomic History   Marital status: Single    Spouse name: Not on file   Number of children: 0   Years of education: Not on file   Highest education level: Not on file  Occupational History   Occupation: retired    Associate Professor: [employer]  Tobacco Use   Smoking status: Never   Smokeless tobacco: Never  Vaping Use   Vaping status: Never Used  Substance and Sexual Activity   Alcohol use: No   Drug use: No   Sexual activity: Not Currently  Other Topics Concern   Not on file  Social History Narrative   Not on file   Social Determinants of Health   Financial Resource Strain: Low Risk  (10/04/2022)   Overall Financial Resource Strain (CARDIA)    Difficulty of Paying Living Expenses: Not hard at all  Food Insecurity: No Food Insecurity (10/04/2022)   Hunger Vital Sign    Worried About Running Out of Food in the Last Year: Never true    Ran Out of Food in the  Last Year: Never true  Transportation Needs: No Transportation Needs (10/04/2022)   PRAPARE - Administrator, Civil Service (Medical): No    Lack of Transportation (Non-Medical): No  Physical Activity: Inactive (10/04/2022)   Exercise Vital Sign    Days of Exercise per Week: 0 days    Minutes of Exercise per Session: 0 min  Stress: No Stress Concern Present (10/04/2022)   [employer] of Occupational Health - Occupational Stress Questionnaire    Feeling of Stress : Not at all  Social Connections: Not on file  Intimate Partner Violence: Not on file   Current Outpatient Medications on File Prior to Visit  Medication Sig Dispense Refill   Accu-Chek FastClix Lancets MISC 1 Units by Does not apply route daily. 100 each 2   Accu-Chek Softclix Lancets lancets 1 each by Other route as directed. Use as instructed 100 each 2   aspirin 81 MG tablet Take 1 tablet (81 mg total) by mouth daily. 30 tablet    Cholecalciferol (VITAMIN D3) 50 MCG (2000 UT) TABS Take 1 tablet by mouth daily.     Continuous Blood Gluc Receiver  (FREESTYLE LIBRE 2 READER) DEVI 1 each by Does not apply route daily. 1 each 3   Continuous Blood Gluc Sensor (FREESTYLE LIBRE 2 SENSOR) MISC 1 each by Does not apply route every 14 (fourteen) days. 6 each 3   escitalopram (LEXAPRO) 10 MG tablet TAKE 1 TABLET(5 MG) BY MOUTH DAILY 90 tablet 3   insulin aspart (NOVOLOG) 100 UNIT/ML injection Inject 10 Units into the skin 3 (three) times daily before meals.     Insulin Pen Needle (B-D UF III MINI PEN NEEDLES) 31G X 5 MM MISC USE TO INJECT INSULIN 4x DAILY 400 each 3   LANTUS SOLOSTAR 100 UNIT/ML Solostar Pen Inject 60 Units into the skin daily. 75 mL 3   lipase/protease/amylase (CREON) 36000 UNITS CPEP capsule TAKE 2 CAPSULES BY MOUTH THREE TIMES DAILY WITH MEALS.  MAY ALSO TAKE 1 CAPSULE AS NEEDED WITH  SNACKS. 240 capsule 3   lisinopril (ZESTRIL) 5 MG tablet Take 1 tablet by mouth once daily 90 tablet 0   metoprolol succinate (TOPROL-XL) 25 MG 24 hr tablet Take 1 tablet (25 mg total) by mouth daily. 90 tablet 3   Multiple Vitamins-Minerals (CENTRUM SILVER ULTRA MENS PO) Take 1 tablet by mouth daily.      ONETOUCH ULTRA TEST test strip USE 1 STRIP TO CHECK GLUCOSE 4 TIMES DAILY 150 each 0   pantoprazole (PROTONIX) 40 MG tablet Take 1 tablet by mouth once daily 90 tablet 3   rosuvastatin (CRESTOR) 40 MG tablet TAKE 1 TABLET(40 MG) BY MOUTH AT BEDTIME 30 tablet 6   No current facility-administered medications on file prior to visit.   Allergies  Allergen Reactions   Lipitor [Atorvastatin] Other (See Comments)    Muscle weakness   Metformin And Related Diarrhea    Severe diarrhea   Family History  Problem Relation Age of Onset   Diabetes Mother    Diabetes Paternal Grandmother    Diabetes Maternal Grandfather    Colon cancer Neg Hx    PE: BP (!) 152/80   Pulse 94   Ht 6' (1.829 m)   Wt 224 lb (101.6 kg)   SpO2 97%   BMI 30.38 kg/m  Wt Readings from Last 3 Encounters:  06/10/23 224 lb (101.6 kg)  02/03/23 221 lb 3.2 oz (100.3 kg)   10/11/22 218 lb 9.6 oz (99.2 kg)  Constitutional: overweight, in NAD Eyes:  EOMI, no exophthalmos ENT: no neck masses, no cervical lymphadenopathy Cardiovascular: Tachycardia, RR, No MRG Respiratory: CTA B Musculoskeletal: no deformities except for left transmetatarsal amputation  Skin:no rashes Neurological: no tremor with outstretched hands Diabetic Foot Exam - Simple   Simple Foot Form Diabetic Foot exam was performed with the following findings: Yes 06/10/2023 11:45 AM  Visual Inspection See comments: Yes Sensation Testing See comments: Yes Pulse Check See comments: Yes Comments Left foot: transmetatarsal amputation - skin with small callus on sole Right foot: Dorsalis pedis pulse nonpalpable, no sensation to monofilament    ASSESSMENT: 1. DM due to pancreatic insufficiency in the setting of gallstone pancreatitis, insulin-dependent, uncontrolled, with complications - CAD, h/o STEMI (LAD), h/o stent - Ischemic cardiomyopathy - CHF - DR - PN - s/p transmetatarsal amputation L foot  2. HL  PLAN:  1. Patient with longstanding, uncontrolled, insulin-dependent diabetes, on basal/bolus insulin regimen, with still suboptimal control.  At last visit, HbA1c decreased to 8.4%, from 8.9%.  We did discuss in the past about trying to intensify his regimen and add another medication, but he did not want to add anything that could increase his risk of pancreatitis.  At last visit, his CGM analysis showed that sugars appears to be still quite fluctuating, slightly better controlled in the second half of the night and first half of the day and then increasing in a stepwise fashion with the most significant increase after dinner.  We discussed that this pattern is suggestive but not enough mealtime coverage.  Upon questioning, he was only taking 10 units of NovoLog before each meal and was not fluctuating the doses as recommended at the previous visit.  I strongly advised him to do so.  I  also gave him a sliding scale.  He was interested in getting more advice about nutrition.  We discussed about this and gave him references.  CGM interpretation: -At today's visit, we reviewed his CGM downloads: It appears that 34% of values are in target range (goal >70%), while 62% are higher than 180 (goal <25%), and 4% are lower than 70 (goal <4%).  The calculated average blood sugar is 198.  -Reviewing the CGM trends, sugars appear to be quite high, fluctuating around the upper limit of the target range but with higher values after lunch and also in the evening.  This morning he had low blood sugars, but he started to have these after changing his sensor yesterday.  We discussed that immediately after changing the sensors, sugars can appear lower than they are.  Indeed, blood sugar when he arrived was 58, but he was in the 90s when checked with a glucometer.  I advised him to always check with the glucometer if the sensor shows some lower blood sugars than expected.  He is now corrected with orange juice whenever he sees low glucose levels. -Upon questioning, he is also not injecting NovoLog before meals if the sugars are normal.  Also, he was not clear about when he had to inject the lower and a higher dose of NovoLog so he is still using a fixed dose of 10 units before each meal to which he does add a sliding scale.  He feels that he occasionally drops his blood sugars too low after correction so at today's visit we discussed about not bolusing more than 4 units for correction.  Also, we discussed about the lag time of injecting insulin from 0 to approximately 30 minutes, depending on  the blood sugars.  I advised him to skip the bolus completely if his sugars are in the 50s and he could verify this with a glucometer.  I also gave him more clear instructions about when to use different doses of NovoLog, based on the size of his meals.  We discussed about trying to improve his diet, also.  For now, we will  keep the same dose of Lantus. - I suggested to:  Patient Instructions  Please continue: - Lantus 60 units in am - NovoLog 15 min before a meal 10 units before a smaller meal 12 unite before a regular meal 14 units before a larger meal - Add the following Novolog sliding scale: - 150-175: + 1 unit  - 176-200: + 2 units  - 201-225: + 3 units  - >225 + 4 units   Please return for another visit in 3-4 months.   - we checked his HbA1c: 8.6% (higher) - advised to check sugars at different times of the day - 4x a day, rotating check times - advised for yearly eye exams >> he is UTD - return to clinic in 3-4 months  2. HL -Latest lipid panel from 06/2021 was reviewed: Fractions at goal with the exception of a low HDL: Lab Results  Component Value Date   CHOL 89 07/09/2021   HDL 32.40 (L) 07/09/2021   LDLCALC 41 07/09/2021   TRIG 81.0 07/09/2021   CHOLHDL 3 07/09/2021  -He is on Crestor 40 mg daily without side effects -will check a lipid panel today   Component     Latest Ref Rng 06/10/2023  Cholesterol     0 - 200 mg/dL 88   Triglycerides     0.0 - 149.0 mg/dL 86.5   HDL Cholesterol     >39.00 mg/dL 78.46 (L)   VLDL     0.0 - 40.0 mg/dL 96.2   LDL (calc)     0 - 99 mg/dL 40   Total CHOL/HDL Ratio 3   NonHDL 55.02   Lipids at goal with the exception of a slightly low HDL, consistent with prior.  Carlus Pavlov, MD PhD [facility] Endocr[facility]ology

## 2023-06-10 NOTE — Patient Instructions (Addendum)
Please continue: - Lantus 60 units in am - NovoLog 15 min before a meal 10 units before a smaller meal 12 unite before a regular meal 14 units before a larger meal - Add the following Novolog sliding scale: - 150-175: + 1 unit  - 176-200: + 2 units  - 201-225: + 3 units  - >225 + 4 units   Please return for another visit in 3-4 months.

## 2023-06-16 ENCOUNTER — Other Ambulatory Visit: Payer: Self-pay | Admitting: Family Medicine

## 2023-06-16 DIAGNOSIS — I10 Essential (primary) hypertension: Secondary | ICD-10-CM

## 2023-06-16 DIAGNOSIS — F419 Anxiety disorder, unspecified: Secondary | ICD-10-CM

## 2023-06-29 ENCOUNTER — Encounter: Payer: Self-pay | Admitting: Family Medicine

## 2023-06-29 ENCOUNTER — Ambulatory Visit (INDEPENDENT_AMBULATORY_CARE_PROVIDER_SITE_OTHER): Payer: Medicare Other | Admitting: Family Medicine

## 2023-06-29 VITALS — BP 118/70 | HR 82 | Temp 98.3°F | Ht 72.0 in | Wt 220.0 lb

## 2023-06-29 DIAGNOSIS — I5042 Chronic combined systolic (congestive) and diastolic (congestive) heart failure: Secondary | ICD-10-CM | POA: Diagnosis not present

## 2023-06-29 DIAGNOSIS — I10 Essential (primary) hypertension: Secondary | ICD-10-CM

## 2023-06-29 DIAGNOSIS — Z794 Long term (current) use of insulin: Secondary | ICD-10-CM

## 2023-06-29 DIAGNOSIS — E7849 Other hyperlipidemia: Secondary | ICD-10-CM

## 2023-06-29 DIAGNOSIS — I2510 Atherosclerotic heart disease of native coronary artery without angina pectoris: Secondary | ICD-10-CM | POA: Diagnosis not present

## 2023-06-29 DIAGNOSIS — K8689 Other specified diseases of pancreas: Secondary | ICD-10-CM

## 2023-06-29 DIAGNOSIS — E089 Diabetes mellitus due to underlying condition without complications: Secondary | ICD-10-CM

## 2023-06-29 NOTE — Assessment & Plan Note (Signed)
Stable. No complaint of angina. Continue aspirin 81 mg daily and metoprolol succinate 25 mg daily. I strongly encouraged regular exercise and discussed ways for him to start making this a regular part of his day.

## 2023-06-29 NOTE — Assessment & Plan Note (Signed)
Lipids are at goal. Continue rosuvastatin 40 mg daily.

## 2023-06-29 NOTE — Assessment & Plan Note (Addendum)
Blood pressure is in good control. Continue metoprolol succinate 25 mg daily and lisinopril 5 mg daily.

## 2023-06-29 NOTE — Assessment & Plan Note (Signed)
Continue to follow with Dr. Elvera Lennox.  - Lantus 60 units in am - NovoLog 15 min before a meal 10 units before a smaller meal 12 units before a regular meal 14 units before a larger meal - Add the following Novolog sliding scale: - 150-175: + 1 unit  - 176-200: + 2 units  - 201-225: + 3 units  - >225 + 4 units

## 2023-06-29 NOTE — Progress Notes (Signed)
[facility] [address] [place] [address] Dept: [phone] Dept Fax: [phone]  Chronic Care Office Visit  Subjective:    Patient ID: David Lane, male    DOB: 21-Oct-1945, 77 y.o..   MRN: [id number]  Chief Complaint  Patient presents with   Hypertension    F/u HTN.    History of Present Illness:  Patient is in today for reassessment of chronic medical issues.  David Lane has a history of coronary artery disease, ischemic cardiomyopathy, and heart failure with preserved EF (EF= 55-60%, 01/2020). He is managed currently with metoprolol 25 mg daily, aspirin 81 mg daily, rosuvastatin 40 mg daily, and lisinopril 5 mg daily (though he takes an additional 5 mg if his blood pressure is more elevated).    David Lane has a history of pancreatic insufficiency related to a prior episode of gallstone pancreatitis. He was prescribed Creon to take 3 caps with each meal and 1 cap with in between meal snacks.    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 This is now being managed by Dr. Elvera Lennox (endocrinology). He has been noted to have labile diabetes. He has a history of complications related to diabetic retinopathy.  He now uses a CGM. His recent averages show:  7-day avg =  233  14-day avg = 206  30-day avg = 199  90-day avg = 222   He is on insulin glargine 60 units each evening and uses a sliding scale of insulin lispro.   Past Medical History: Patient Active Problem List   Diagnosis Date Noted   Diabetic retinopathy (HCC) 07/09/2021   Lower urinary tract symptoms (LUTS) 07/09/2021   Onychomycosis 03/17/2021   Skin pimple 03/17/2021   Coronary artery disease involving native coronary artery of native heart without angina pectoris 06/21/2019   AVM (arteriovenous malformation) of colon    History of colonic polyps    Duodenitis    Gastroesophageal reflux disease with esophagitis     Acute upper GI bleed 04/23/2019   Labile blood glucose    Functional gait disorder 01/20/2018   Acute blood loss anemia 01/20/2018   Status post transmetatarsal amputation of left foot (HCC) 01/20/2018   Status post coronary artery stent placement    Acute ST elevation myocardial infarction (STEMI) involving left anterior descending (LAD) coronary artery (HCC) 12/20/2017   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 (HCC) 12/14/2015   Pancreatic insufficiency 01/25/2013   Ischemic cardiomyopathy, EF 50% by echo 08/01/13 07/19/2012   Chronic combined systolic and diastolic CHF (congestive heart failure) (HCC) 07/14/2012   Pulmonary edema, most likely due to diastolic dysfunction 07/14/2012   CAD, 07/14/12- LAD/PDA DES    Anxiety 12/12/2009   Hyperlipidemia 04/24/2007   Essential hypertension 04/24/2007   History of pancreatitis 04/24/2007   Past Surgical History:  Procedure Laterality Date   AMPUTATION Left 01/14/2018   Procedure: TRANSMETATARSAL AMPUTATION;  Surgeon: Nadara Mustard, MD;  Location: [facility] OR;  Service: Orthopedics;  Laterality: Left;   BIOPSY  04/26/2019   Procedure: BIOPSY;  Surgeon: Lemar Lofty., MD;  Location: [facility];  Service: Gastroenterology;;   CARDIAC CATHETERIZATION  2011   minimal disease, medical management   COLONOSCOPY WITH PROPOFOL N/A 04/26/2019   Procedure: COLONOSCOPY WITH PROPOFOL;  Surgeon: Meridee Score Netty Starring., MD;  Location: [facility];  Service: Gastroenterology;  Laterality: N/A;   CORONARY ANGIOPLASTY WITH STENT PLACEMENT  07/14/2012   successful PCI & stenting of mid LAD &  PDA off the dominant CX   CORONARY/GRAFT ACUTE MI REVASCULARIZATION N/A 12/20/2017   Procedure: Coronary/Graft Acute MI Revascularization;  Surgeon: Swaziland, Peter M, MD;  Location: [facility] INVASIVE CV LAB;  Service: Cardiovascular;  Laterality: N/A;   ENTEROSCOPY N/A 04/25/2019   Procedure: ENTEROSCOPY;  Surgeon: Meridee Score Netty Starring., MD;  Location: [facility]  ENDOSCOPY;  Service: Gastroenterology;  Laterality: N/A;   ESOPHAGOGASTRODUODENOSCOPY Left 04/24/2019   Procedure: ESOPHAGOGASTRODUODENOSCOPY (EGD);  Surgeon: Shellia Cleverly, DO;  Location: [facility];  Service: Gastroenterology;  Laterality: Left;   HEMOSTASIS CLIP PLACEMENT  04/26/2019   Procedure: HEMOSTASIS CLIP PLACEMENT;  Surgeon: Lemar Lofty., MD;  Location: [facility];  Service: Gastroenterology;;   HOT HEMOSTASIS N/A 04/26/2019   Procedure: HOT HEMOSTASIS (ARGON PLASMA COAGULATION/BICAP);  Surgeon: Lemar Lofty., MD;  Location: [facility];  Service: Gastroenterology;  Laterality: N/A;   INCISION AND DRAINAGE ABSCESS ANAL  1970's   LEFT HEART CATH AND CORONARY ANGIOGRAPHY N/A 12/20/2017   Procedure: LEFT HEART CATH AND CORONARY ANGIOGRAPHY;  Surgeon: Swaziland, Peter M, MD;  Location: [facility] INVASIVE CV LAB;  Service: Cardiovascular;  Laterality: N/A;   LEFT HEART CATHETERIZATION WITH CORONARY ANGIOGRAM N/A 07/14/2012   Procedure: LEFT HEART CATHETERIZATION WITH CORONARY ANGIOGRAM;  Surgeon: Thurmon Fair, MD;  Location: [facility] CATH LAB;  Service: Cardiovascular;  Laterality: N/A;   PERCUTANEOUS CORONARY STENT INTERVENTION (PCI-S) Right 07/14/2012   Procedure: PERCUTANEOUS CORONARY STENT INTERVENTION (PCI-S);  Surgeon: Thurmon Fair, MD;  Location: [facility] CATH LAB;  Service: Cardiovascular;  Laterality: Right;   POLYPECTOMY  04/26/2019   Procedure: POLYPECTOMY;  Surgeon: Mansouraty, Netty Starring., MD;  Location: [facility];  Service: Gastroenterology;;   SUBMUCOSAL TATTOO INJECTION  04/25/2019   Procedure: SUBMUCOSAL TATTOO INJECTION;  Surgeon: Lemar Lofty., MD;  Location: [facility];  Service: Gastroenterology;;   Family History  Problem Relation Age of Onset   Diabetes Mother    Diabetes Paternal Grandmother    Diabetes Maternal Grandfather    Colon cancer Neg Hx    Outpatient Medications Prior to Visit  Medication Sig Dispense Refill   Accu-Chek  FastClix Lancets MISC 1 Units by Does not apply route daily. 100 each 2   Accu-Chek Softclix Lancets lancets 1 each by Other route as directed. Use as instructed 100 each 2   aspirin 81 MG tablet Take 1 tablet (81 mg total) by mouth daily. 30 tablet    Cholecalciferol (VITAMIN D3) 50 MCG (2000 UT) TABS Take 1 tablet by mouth daily.     Continuous Blood Gluc Receiver (FREESTYLE LIBRE 2 READER) DEVI 1 each by Does not apply route daily. 1 each 3   Continuous Blood Gluc Sensor (FREESTYLE LIBRE 2 SENSOR) MISC 1 each by Does not apply route every 14 (fourteen) days. 6 each 3   escitalopram (LEXAPRO) 10 MG tablet Take 1 tablet by mouth once daily 90 tablet 0   insulin aspart (NOVOLOG) 100 UNIT/ML injection Inject 10 Units into the skin 3 (three) times daily before meals.     Insulin Pen Needle (B-D UF III MINI PEN NEEDLES) 31G X 5 MM MISC USE TO INJECT INSULIN 4x DAILY 400 each 3   LANTUS SOLOSTAR 100 UNIT/ML Solostar Pen Inject 60 Units into the skin daily. 75 mL 3   lipase/protease/amylase (CREON) 36000 UNITS CPEP capsule TAKE 2 CAPSULES BY MOUTH THREE TIMES DAILY WITH MEALS.  MAY ALSO TAKE 1 CAPSULE AS NEEDED WITH  SNACKS. 240 capsule 3   lisinopril (ZESTRIL) 5 MG tablet Take 1 tablet by mouth  once daily 90 tablet 0   metoprolol succinate (TOPROL-XL) 25 MG 24 hr tablet Take 1 tablet (25 mg total) by mouth daily. 90 tablet 3   Multiple Vitamins-Minerals (CENTRUM SILVER ULTRA MENS PO) Take 1 tablet by mouth daily.      ONETOUCH ULTRA TEST test strip USE 1 STRIP TO CHECK GLUCOSE 4 TIMES DAILY 150 each 0   pantoprazole (PROTONIX) 40 MG tablet Take 1 tablet by mouth once daily 90 tablet 3   rosuvastatin (CRESTOR) 40 MG tablet TAKE 1 TABLET(40 MG) BY MOUTH AT BEDTIME 30 tablet 6   No facility-administered medications prior to visit.   Allergies  Allergen Reactions   Lipitor [Atorvastatin] Other (See Comments)    Muscle weakness   Metformin And Related Diarrhea    Severe diarrhea   Objective:    Today's Vitals   06/29/23 1457  BP: 118/70  Pulse: 82  Temp: 98.3 F (36.8 C)  TempSrc: Temporal  SpO2: 98%  Weight: 220 lb (99.8 kg)  Height: 6' (1.829 m)   Body mass index is 29.84 kg/m.   General: Well developed, well nourished. No acute distress. Psych: Alert and oriented. Normal mood and affect.  Health Maintenance Due  Topic Date Due   OPHTHALMOLOGY EXAM  Never done   Hepatitis C Screening  Never done   Zoster Vaccines- Shingrix (1 of 2) Never done   Pneumonia Vaccine 110+ Years old (2 of 2 - PPSV23 or PCV20) 12/05/2015   Lab Results Last hemoglobin A1c Lab Results  Component Value Date   HGBA1C 8.6 (A) 06/10/2023   Assessment & Plan:   Problem List Items Addressed This Visit       Cardiovascular and Mediastinum   Chronic combined systolic and diastolic CHF (congestive heart failure) (HCC)    Compensated. Continue metoprolol succinate 25 mg daily and lisinopril 5 mg daily.      Coronary artery disease involving native coronary artery of native heart without angina pectoris - Primary    Stable. No complaint of angina. Continue aspirin 81 mg daily and metoprolol succinate 25 mg daily. I strongly encouraged regular exercise and discussed ways for him to start making this a regular part of his day.      Essential hypertension    Blood pressure is in good control. Continue metoprolol succinate 25 mg daily and lisinopril 5 mg daily.        Digestive   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 (HCC)    Continue to follow with Dr. Elvera Lennox.  - Lantus 60 units in am - NovoLog 15 min before a meal 10 units before a smaller meal 12 units before a regular meal 14 units before a larger meal - Add the following Novolog sliding scale: - 150-175: + 1 unit  - 176-200: + 2 units  - 201-225: + 3 units  - >225 + 4 units         Other   Hyperlipidemia    Lipids are at goal. Continue rosuvastatin 40 mg daily.       Return in about 3 months  (around 09/29/2023) for Reassessment.   Loyola Mast, MD

## 2023-06-29 NOTE — Assessment & Plan Note (Addendum)
Compensated. Continue metoprolol succinate 25 mg daily and lisinopril 5 mg daily.

## 2023-06-30 ENCOUNTER — Telehealth: Payer: Self-pay | Admitting: Family Medicine

## 2023-06-30 NOTE — Telephone Encounter (Signed)
Patient notified VIA phone that RX was sent 06/17/23 to the pharmacy.  Called the pharmacy and they will get it ready. Dm/cma

## 2023-06-30 NOTE — Telephone Encounter (Signed)
Prescription Request  06/30/2023  LOV: 06/29/2023  What is the name of the medication or equipment? lisinopril (ZESTRIL) 5 MG tablet [[id number]]   Have you contacted your pharmacy to request a refill? No   Which pharmacy would you like this sent to?  [id number] - [place], [place] - [address] AT [facility] [address] [place] [address] Phone: [phone] Fax: [phone]    Patient notified that their request is being sent to the clinical staff for review and that they should receive a response within 2 business days.   Please advise at Mobile [phone] (mobile)   Pt stated he needs the 5ml and the 10ml

## 2023-08-11 ENCOUNTER — Other Ambulatory Visit: Payer: Self-pay | Admitting: Family Medicine

## 2023-08-11 DIAGNOSIS — E7849 Other hyperlipidemia: Secondary | ICD-10-CM

## 2023-08-13 ENCOUNTER — Other Ambulatory Visit: Payer: Self-pay | Admitting: Family Medicine

## 2023-08-13 DIAGNOSIS — E7849 Other hyperlipidemia: Secondary | ICD-10-CM

## 2023-09-02 ENCOUNTER — Other Ambulatory Visit: Payer: Self-pay | Admitting: Family Medicine

## 2023-09-02 DIAGNOSIS — I10 Essential (primary) hypertension: Secondary | ICD-10-CM

## 2023-09-26 ENCOUNTER — Other Ambulatory Visit: Payer: Self-pay | Admitting: Family Medicine

## 2023-09-26 DIAGNOSIS — E089 Diabetes mellitus due to underlying condition without complications: Secondary | ICD-10-CM

## 2023-09-26 DIAGNOSIS — F419 Anxiety disorder, unspecified: Secondary | ICD-10-CM

## 2023-10-04 ENCOUNTER — Ambulatory Visit (INDEPENDENT_AMBULATORY_CARE_PROVIDER_SITE_OTHER): Payer: Medicare Other | Admitting: Family Medicine

## 2023-10-04 ENCOUNTER — Encounter: Payer: Self-pay | Admitting: Family Medicine

## 2023-10-04 VITALS — BP 150/80 | HR 102 | Temp 98.6°F | Ht 72.0 in | Wt 226.0 lb

## 2023-10-04 DIAGNOSIS — K8689 Other specified diseases of pancreas: Secondary | ICD-10-CM

## 2023-10-04 DIAGNOSIS — I5042 Chronic combined systolic (congestive) and diastolic (congestive) heart failure: Secondary | ICD-10-CM | POA: Diagnosis not present

## 2023-10-04 DIAGNOSIS — I10 Essential (primary) hypertension: Secondary | ICD-10-CM

## 2023-10-04 DIAGNOSIS — I2510 Atherosclerotic heart disease of native coronary artery without angina pectoris: Secondary | ICD-10-CM | POA: Diagnosis not present

## 2023-10-04 DIAGNOSIS — E089 Diabetes mellitus due to underlying condition without complications: Secondary | ICD-10-CM | POA: Diagnosis not present

## 2023-10-04 DIAGNOSIS — Z23 Encounter for immunization: Secondary | ICD-10-CM | POA: Diagnosis not present

## 2023-10-04 DIAGNOSIS — E7849 Other hyperlipidemia: Secondary | ICD-10-CM

## 2023-10-04 DIAGNOSIS — Z794 Long term (current) use of insulin: Secondary | ICD-10-CM

## 2023-10-04 DIAGNOSIS — F419 Anxiety disorder, unspecified: Secondary | ICD-10-CM

## 2023-10-04 MED ORDER — LISINOPRIL 10 MG PO TABS: 5.0000 mg | ORAL_TABLET | Freq: Every day | ORAL | 3 refills | Status: DC

## 2023-10-04 MED ORDER — ESCITALOPRAM OXALATE 10 MG PO TABS: 10.0000 mg | ORAL_TABLET | Freq: Every day | ORAL | 3 refills | Status: DC

## 2023-10-04 NOTE — Assessment & Plan Note (Signed)
 Continue to follow with Dr. Elvera Lennox. Has an appointment in 2 days. I will check his A1c today.

## 2023-10-04 NOTE — Assessment & Plan Note (Signed)
 I will restart his escitalopram at 10 mg daily.

## 2023-10-04 NOTE — Assessment & Plan Note (Signed)
 Stable. Continue aspirin 81 mg daily and metoprolol succinate 25 mg daily.

## 2023-10-04 NOTE — Progress Notes (Signed)
 [facility] [address] [place] [address] Dept: [phone] Dept Fax: [phone]  Chronic Care Office Visit  Subjective:    Patient ID: David Lane, male    DOB: [birth year], 78 y.o..   MRN: [id number]  Chief Complaint  Patient presents with   Diabetes    3 month f/u.     History of Present Illness:  Patient is in today for reassessment of chronic medical issues.  [name] has a history of coronary artery disease, ischemic cardiomyopathy, and heart failure with preserved EF (EF= 55-60%, 01/2020). He is managed currently with metoprolol  25 mg daily, aspirin  81 mg daily, rosuvastatin  40 mg daily, and lisinopril  5 mg daily (though he takes an additional 5 mg if his blood pressure is more elevated). He notes he sometimes runs low with his prescription due to taking the additional dose. He has been monitoring his BP at home. His 7-day average is 130/73   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 This is now being managed by Dr. Trixie (endocrinology). He has been noted to have labile diabetes. He has a history of complications related to diabetic retinopathy.  He uses a CGM. His 7-day averages shows his glucose ranging 259-320.  He is on insulin  glargine 60 units each evening and uses a sliding scale of insulin  lispro. At times he finds he does get some low blood sugars.  [name] notes concerns about his abdomen being more bloated.He has been reading on the internet about possible causes of this. He has not had any abdominal pain. He denies any increased intestinal gas.  Past Medical History: Patient Active Problem List   Diagnosis Date Noted   Diabetic retinopathy (HCC) 07/09/2021   Lower urinary tract symptoms (LUTS) 07/09/2021   Onychomycosis 03/17/2021   Skin pimple 03/17/2021   Coronary artery disease involving native coronary artery of native heart without angina pectoris  06/21/2019   AVM (arteriovenous malformation) of colon    History of colonic polyps    Duodenitis    Gastroesophageal reflux disease with esophagitis    Acute upper GI bleed 04/23/2019   Labile blood glucose    Functional gait disorder 01/20/2018   Acute blood loss anemia 01/20/2018   Status post transmetatarsal amputation of left foot (HCC) 01/20/2018   Status post coronary artery stent placement    Acute ST elevation myocardial infarction (STEMI) involving left anterior descending (LAD) coronary artery (HCC) 12/20/2017   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 (HCC) 12/14/2015   Pancreatic insufficiency 01/25/2013   Ischemic cardiomyopathy, EF 50% by echo 08/01/13 07/19/2012   Chronic combined systolic and diastolic CHF (congestive heart failure) (HCC) 07/14/2012   Pulmonary edema, most likely due to diastolic dysfunction 07/14/2012   CAD, 07/14/12- LAD/PDA DES    Anxiety 12/12/2009   Hyperlipidemia 04/24/2007   Essential hypertension 04/24/2007   History of pancreatitis 04/24/2007   Past Surgical History:  Procedure Laterality Date   AMPUTATION Left 01/14/2018   Procedure: TRANSMETATARSAL AMPUTATION;  Surgeon: Harden Jerona GAILS, MD;  Location: [facility] OR;  Service: Orthopedics;  Laterality: Left;   BIOPSY  04/26/2019   Procedure: BIOPSY;  Surgeon: Wilhelmenia Aloha Raddle., MD;  Location: [facility];  Service: Gastroenterology;;   CARDIAC CATHETERIZATION  2011   minimal disease, medical management   COLONOSCOPY WITH PROPOFOL  N/A 04/26/2019   Procedure: COLONOSCOPY WITH PROPOFOL ;  Surgeon: Mansouraty, Aloha Raddle., MD;  Location: [facility];  Service: Gastroenterology;  Laterality: N/A;  CORONARY ANGIOPLASTY WITH STENT PLACEMENT  07/14/2012   successful PCI & stenting of mid LAD & PDA off the dominant CX   CORONARY/GRAFT ACUTE MI REVASCULARIZATION N/A 12/20/2017   Procedure: Coronary/Graft Acute MI Revascularization;  Surgeon: Jordan, Peter M, MD;  Location: [facility] INVASIVE CV  LAB;  Service: Cardiovascular;  Laterality: N/A;   ENTEROSCOPY N/A 04/25/2019   Procedure: ENTEROSCOPY;  Surgeon: Wilhelmenia Aloha Raddle., MD;  Location: [facility];  Service: Gastroenterology;  Laterality: N/A;   ESOPHAGOGASTRODUODENOSCOPY Left 04/24/2019   Procedure: ESOPHAGOGASTRODUODENOSCOPY (EGD);  Surgeon: San Sandor GAILS, DO;  Location: [facility];  Service: Gastroenterology;  Laterality: Left;   HEMOSTASIS CLIP PLACEMENT  04/26/2019   Procedure: HEMOSTASIS CLIP PLACEMENT;  Surgeon: Wilhelmenia Aloha Raddle., MD;  Location: [facility];  Service: Gastroenterology;;   HOT HEMOSTASIS N/A 04/26/2019   Procedure: HOT HEMOSTASIS (ARGON PLASMA COAGULATION/BICAP);  Surgeon: Wilhelmenia Aloha Raddle., MD;  Location: [facility];  Service: Gastroenterology;  Laterality: N/A;   INCISION AND DRAINAGE ABSCESS ANAL  1970's   LEFT HEART CATH AND CORONARY ANGIOGRAPHY N/A 12/20/2017   Procedure: LEFT HEART CATH AND CORONARY ANGIOGRAPHY;  Surgeon: Jordan, Peter M, MD;  Location: [facility] INVASIVE CV LAB;  Service: Cardiovascular;  Laterality: N/A;   LEFT HEART CATHETERIZATION WITH CORONARY ANGIOGRAM N/A 07/14/2012   Procedure: LEFT HEART CATHETERIZATION WITH CORONARY ANGIOGRAM;  Surgeon: Jerel Balding, MD;  Location: [facility] CATH LAB;  Service: Cardiovascular;  Laterality: N/A;   PERCUTANEOUS CORONARY STENT INTERVENTION (PCI-S) Right 07/14/2012   Procedure: PERCUTANEOUS CORONARY STENT INTERVENTION (PCI-S);  Surgeon: Jerel Balding, MD;  Location: [facility] CATH LAB;  Service: Cardiovascular;  Laterality: Right;   POLYPECTOMY  04/26/2019   Procedure: POLYPECTOMY;  Surgeon: Mansouraty, Aloha Raddle., MD;  Location: [facility];  Service: Gastroenterology;;   SUBMUCOSAL TATTOO INJECTION  04/25/2019   Procedure: SUBMUCOSAL TATTOO INJECTION;  Surgeon: Wilhelmenia Aloha Raddle., MD;  Location: [facility];  Service: Gastroenterology;;   Family History  Problem Relation Age of Onset   Diabetes Mother    Diabetes Paternal  Grandmother    Diabetes Maternal Grandfather    Colon cancer Neg Hx    Outpatient Medications Prior to Visit  Medication Sig Dispense Refill   Accu-Chek FastClix Lancets MISC 1 Units by Does not apply route daily. 100 each 2   Accu-Chek Softclix Lancets lancets 1 each by Other route as directed. Use as instructed 100 each 2   aspirin  81 MG tablet Take 1 tablet (81 mg total) by mouth daily. 30 tablet    Cholecalciferol (VITAMIN D3) 50 MCG (2000 UT) TABS Take 1 tablet by mouth daily.     Continuous Blood Gluc Receiver (FREESTYLE LIBRE 2 READER) DEVI 1 each by Does not apply route daily. 1 each 3   Continuous Blood Gluc Sensor (FREESTYLE LIBRE 2 SENSOR) MISC 1 each by Does not apply route every 14 (fourteen) days. 6 each 3   escitalopram  (LEXAPRO ) 10 MG tablet Take 1 tablet by mouth once daily 90 tablet 3   insulin  aspart (NOVOLOG ) 100 UNIT/ML injection Inject 10 Units into the skin 3 (three) times daily before meals.     Insulin  Pen Needle (B-D UF III MINI PEN NEEDLES) 31G X 5 MM MISC USE TO INJECT INSULIN  4x DAILY 400 each 3   LANTUS  SOLOSTAR 100 UNIT/ML Solostar Pen Inject 60 Units into the skin daily. 75 mL 3   lipase/protease/amylase (CREON ) 36000 UNITS CPEP capsule TAKE 2 CAPSULES BY MOUTH THREE TIMES DAILY WITH MEALS.  MAY ALSO TAKE 1 CAPSULE AS NEEDED WITH  SNACKS. 240 capsule 3   lisinopril  (ZESTRIL ) 5 MG tablet Take 1 tablet by mouth once daily 90 tablet 3   metoprolol  succinate (TOPROL -XL) 25 MG 24 hr tablet Take 1 tablet (25 mg total) by mouth daily. 90 tablet 3   Multiple Vitamins-Minerals (CENTRUM SILVER ULTRA MENS PO) Take 1 tablet by mouth daily.      ONETOUCH ULTRA TEST test strip USE 1 STRIP TO CHECK GLUCOSE 4 TIMES DAILY 150 each 0   pantoprazole  (PROTONIX ) 40 MG tablet Take 1 tablet by mouth once daily 90 tablet 3   rosuvastatin  (CRESTOR ) 40 MG tablet TAKE 1 TABLET BY MOUTH AT BEDTIME 30 tablet 0   No facility-administered medications prior to visit.   Allergies  Allergen  Reactions   Lipitor [Atorvastatin ] Other (See Comments)    Muscle weakness   Metformin  And Related Diarrhea    Severe diarrhea   Objective:   Today's Vitals   10/04/23 1455 10/04/23 1547  BP: (!) 174/98 (!) 150/80  Pulse: (!) 102   Temp: 98.6 F (37 C)   TempSrc: Temporal   SpO2: 96%   Weight: 226 lb (102.5 kg)   Height: 6' (1.829 m)    Body mass index is 30.65 kg/m.   General: Well developed, well nourished. No acute distress. Abdomen: Soft, non-tender. Bowel sounds positive, normal pitch and frequency. No hepatosplenomegaly. No   rebound or guarding. Psych: Alert and oriented. Normal mood and affect.  Health Maintenance Due  Topic Date Due   OPHTHALMOLOGY EXAM  Never done   Hepatitis C Screening  Never done   Zoster Vaccines- Shingrix (1 of 2) Never done   Pneumonia Vaccine 74+ Years old (2 of 2 - PPSV23 or PCV20) 01/30/2015   Medicare Annual Wellness (AWV)  10/05/2023   Diabetic kidney evaluation - eGFR measurement  10/12/2023   Diabetic kidney evaluation - Urine ACR  10/12/2023     Assessment & Plan:   Problem List Items Addressed This Visit       Cardiovascular and Mediastinum   CAD, 07/14/12- LAD/PDA DES   Stable. Continue aspirin  81 mg daily and metoprolol  succinate 25 mg daily.      Relevant Medications   lisinopril  (ZESTRIL ) 10 MG tablet   Chronic combined systolic and diastolic CHF (congestive heart failure) (HCC)   Compensated. Continue metoprolol  succinate 25 mg daily and lisinopril  10 mg 1/2 tab daily.      Relevant Medications   lisinopril  (ZESTRIL ) 10 MG tablet   Essential hypertension - Primary   Blood pressure has been in good control at home. He has a strong component of white coat hypertension in the office setting. Continue metoprolol  succinate 25 mg daily. I will switch his lisinopril  to a 10 mg tablet. He can take 1/2 tab (5 mg) as his usual daily dose and a full tablet when his systolic is > 150.      Relevant Medications   lisinopril   (ZESTRIL ) 10 MG tablet     Digestive   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 (HCC)   Continue to follow with Dr. Trixie. Has an appointment in 2 days. I will check his A1c today.      Relevant Medications   lisinopril  (ZESTRIL ) 10 MG tablet   Other Relevant Orders   Lipid panel   Microalbumin / creatinine urine ratio   Basic metabolic panel   Hemoglobin A1c   Urinalysis, Routine w reflex microscopic     Other   Anxiety   I will restart  his escitalopram  at 10 mg daily.      Relevant Medications   escitalopram  (LEXAPRO ) 10 MG tablet   Hyperlipidemia   Lipids are at goal. Continue rosuvastatin  40 mg daily.      Relevant Medications   lisinopril  (ZESTRIL ) 10 MG tablet   Other Relevant Orders   Lipid panel   Other Visit Diagnoses       Need for pneumococcal 20-valent conjugate vaccination       Relevant Orders   Pneumococcal conjugate vaccine 20-valent (Completed)       Return in about 3 months (around 01/02/2024) for Reassessment.   Garnette CHRISTELLA Simpler, MD

## 2023-10-04 NOTE — Assessment & Plan Note (Signed)
 Blood pressure has been in good control at home. He has a strong component of white coat hypertension in the office setting. Continue metoprolol  succinate 25 mg daily. I will switch his lisinopril  to a 10 mg tablet. He can take 1/2 tab (5 mg) as his usual daily dose and a full tablet when his systolic is > 150.

## 2023-10-04 NOTE — Assessment & Plan Note (Signed)
Lipids are at goal. Continue rosuvastatin 40 mg daily.

## 2023-10-04 NOTE — Assessment & Plan Note (Signed)
 Compensated. Continue metoprolol succinate 25 mg daily and lisinopril 10 mg 1/2 tab daily.

## 2023-10-05 LAB — LIPID PANEL
Cholesterol: 93 mg/dL (ref 0–200)
HDL: 30.7 mg/dL — ABNORMAL LOW (ref >39.00)
LDL Cholesterol: 47 mg/dL (ref 0–99)
NonHDL: 62.44
Total CHOL/HDL Ratio: 3
Triglycerides: 79 mg/dL (ref 0.0–149.0)
VLDL: 15.8 mg/dL (ref 0.0–40.0)

## 2023-10-05 LAB — BASIC METABOLIC PANEL
BUN: 20 mg/dL (ref 6–23)
CO2: 27 meq/L (ref 19–32)
Calcium: 9.4 mg/dL (ref 8.4–10.5)
Chloride: 104 meq/L (ref 96–112)
Creatinine, Ser: 1.18 mg/dL (ref 0.40–1.50)
GFR: 60.55 mL/min (ref >60.00)
Glucose, Bld: 123 mg/dL — ABNORMAL HIGH (ref 70–99)
Potassium: 4.7 meq/L (ref 3.5–5.1)
Sodium: 140 meq/L (ref 135–145)

## 2023-10-05 LAB — HEMOGLOBIN A1C: Hgb A1c MFr Bld: 8.9 % — ABNORMAL HIGH (ref 4.6–6.5)

## 2023-10-06 ENCOUNTER — Encounter: Payer: Self-pay | Admitting: Internal Medicine

## 2023-10-06 ENCOUNTER — Ambulatory Visit (INDEPENDENT_AMBULATORY_CARE_PROVIDER_SITE_OTHER): Payer: Medicare Other | Admitting: Internal Medicine

## 2023-10-06 VITALS — BP 172/80 | HR 92 | Ht 72.0 in | Wt 227.8 lb

## 2023-10-06 DIAGNOSIS — E7849 Other hyperlipidemia: Secondary | ICD-10-CM | POA: Diagnosis not present

## 2023-10-06 DIAGNOSIS — E1165 Type 2 diabetes mellitus with hyperglycemia: Secondary | ICD-10-CM | POA: Diagnosis not present

## 2023-10-06 DIAGNOSIS — Z794 Long term (current) use of insulin: Secondary | ICD-10-CM

## 2023-10-06 DIAGNOSIS — E1159 Type 2 diabetes mellitus with other circulatory complications: Secondary | ICD-10-CM | POA: Diagnosis not present

## 2023-10-06 NOTE — Patient Instructions (Addendum)
 Please split: - Lantus  30 units in am and 30 units at night - try the sides of the abdomen or the thighs  Continue: - NovoLog  15 min before a meal 10 units before a smaller meal 12 unite before a regular meal 14 units before a larger meal - Add the following Novolog  sliding scale: - 150-175: + 1 unit  - 176-200: + 2 units  - 201-225: + 3 units  - >225 + 4 units   Please return for another visit in 3-4 months.

## 2023-10-06 NOTE — Progress Notes (Signed)
 Patient ID: David Lane, male   DOB: 04-May-1946, 78 y.o.   MRN: [id number]  HPI: David Lane is a 78 y.o.-year-old male, returning for follow-up for DM2, dx in 1988 during an episode of gallstone pancreatitis, insulin -dependent since 1990, uncontrolled, with complications (CAD - s/p AMI, CHF, DR, CKD, PN). Pt. previously saw Dr. Kassie, but last visit with me 4 months ago. His sister is also my pt.: David Lane.  Interim history: No increased urination, blurry vision, nausea, chest pain.  Reviewed HbA1c: Lab Results  Component Value Date   HGBA1C 8.9 (H) 10/04/2023   HGBA1C 8.6 (A) 06/10/2023   HGBA1C 8.4 (A) 02/03/2023   HGBA1C 8.9 (A) 10/06/2022   HGBA1C 9.4 (H) 04/29/2022   HGBA1C 8.9 (H) 01/07/2022   HGBA1C 8.8 (H) 10/09/2021   HGBA1C 9.4 (H) 07/09/2021   HGBA1C 8.2 (A) 11/20/2020   HGBA1C 8.8 (A) 08/14/2020   Previously on: - Lantus  70 >> 60 units in am - Novolog  5-10 units 3x a day, before meals (vials) >> 8-12 units 3x a day, 15 min before meals (actually using only 10 units) He tried Metformin  >> fatigue and weakness.  Now on: - Lantus  60 units midday >> in am - NovoLog  10-14 units 3x a day, 15 min before meals >>  10 units before a smaller meal 12 unite before a regular meal 14 units before a larger meal - Novolog  sliding scale: - 150-175: + 1 unit  - 176-200: + 2 units  - 201-225: + 3 units  - 226-250: + 4 units   Pt checks his sugars >4x a day and they are:  Previously:  Previously:   Lowest sugar was 61 >> 50 >> 50s >> 60; he has hypoglycemia awareness at 100.  Highest sugar was 541 >> HI >> 400s >> 400s.  Glucometer: Accu-Chek/One Touch ultra  - + Mild CKD, last BUN/creatinine:  Lab Results  Component Value Date   BUN 20 10/04/2023   BUN 26 (H) 10/11/2022   CREATININE 1.18 10/04/2023   CREATININE 1.35 10/11/2022   Lab Results  Component Value Date   MICRALBCREAT 0.8 10/11/2022   MICRALBCREAT 1.8 07/09/2021   MICRALBCREAT 2.8  11/25/2014   MICRALBCREAT 11.2 01/24/2013   MICRALBCREAT 5.1 08/03/2010   MICRALBCREAT 9.8 06/27/2009  On Lisinopril  5 mg  -+ HL; last set of lipids: Lab Results  Component Value Date   CHOL 93 10/04/2023   HDL 30.70 (L) 10/04/2023   LDLCALC 47 10/04/2023   TRIG 79.0 10/04/2023   CHOLHDL 3 10/04/2023  On Crestor  40 mg daily.  - last eye exam was in 2021-2022. + DR - had to have IO injections. Had cataract surgery in the last year.  - no numbness and tingling in his feet.  Last foot exam 06/10/2023.  He also has a history of HTN, GERD, anxiety, onychomycosis.  ROS: + see HPI Past Medical History:  Diagnosis Date   CAD (coronary artery disease)    last cath by [facility] DR.  Mihai Croitoru showing  some  disease involving LCX and small size of Diag    CHF exacerbation, due to diastolic dysfunction 07/17/2012   Chronic systolic CHF (congestive heart failure), NYHA class 1 (HCC) 07/17/2012   GERD (gastroesophageal reflux disease)    Hepatic lesion 02/04/11   Hyperlipemia    Hypertension    Ischemic cardiomyopathy    EF 35-40%   Liver hemangioma    NSTEMI (non-ST elevated myocardial infarction) (HCC) 11/21/2009   Pancreatitis 2000's  Shortness of breath    Type II diabetes mellitus [facility])    Past Surgical History:  Procedure Laterality Date   AMPUTATION Left 01/14/2018   Procedure: TRANSMETATARSAL AMPUTATION;  Surgeon: Harden Jerona GAILS, MD;  Location: [facility] OR;  Service: Orthopedics;  Laterality: Left;   BIOPSY  04/26/2019   Procedure: BIOPSY;  Surgeon: Wilhelmenia Aloha Raddle., MD;  Location: [facility]COPY;  Service: Gastroenterology;;   CARDIAC CATHETERIZATION  2011   minimal disease, medical management   COLONOSCOPY WITH PROPOFOL  N/A 04/26/2019   Procedure: COLONOSCOPY WITH PROPOFOL ;  Surgeon: Mansouraty, Aloha Raddle., MD;  Location: [facility];  Service: Gastroenterology;  Laterality: N/A;   CORONARY ANGIOPLASTY WITH STENT PLACEMENT  07/14/2012   successful PCI & stenting of mid  LAD & PDA off the dominant CX   CORONARY/GRAFT ACUTE MI REVASCULARIZATION N/A 12/20/2017   Procedure: Coronary/Graft Acute MI Revascularization;  Surgeon: Jordan, Peter M, MD;  Location: [facility] INVASIVE CV LAB;  Service: Cardiovascular;  Laterality: N/A;   ENTEROSCOPY N/A 04/25/2019   Procedure: ENTEROSCOPY;  Surgeon: Wilhelmenia Aloha Raddle., MD;  Location: [facility];  Service: Gastroenterology;  Laterality: N/A;   ESOPHAGOGASTRODUODENOSCOPY Left 04/24/2019   Procedure: ESOPHAGOGASTRODUODENOSCOPY (EGD);  Surgeon: San Sandor GAILS, DO;  Location: [facility];  Service: Gastroenterology;  Laterality: Left;   HEMOSTASIS CLIP PLACEMENT  04/26/2019   Procedure: HEMOSTASIS CLIP PLACEMENT;  Surgeon: Wilhelmenia Aloha Raddle., MD;  Location: [facility];  Service: Gastroenterology;;   HOT HEMOSTASIS N/A 04/26/2019   Procedure: HOT HEMOSTASIS (ARGON PLASMA COAGULATION/BICAP);  Surgeon: Wilhelmenia Aloha Raddle., MD;  Location: [facility];  Service: Gastroenterology;  Laterality: N/A;   INCISION AND DRAINAGE ABSCESS ANAL  1970's   LEFT HEART CATH AND CORONARY ANGIOGRAPHY N/A 12/20/2017   Procedure: LEFT HEART CATH AND CORONARY ANGIOGRAPHY;  Surgeon: Jordan, Peter M, MD;  Location: [facility] INVASIVE CV LAB;  Service: Cardiovascular;  Laterality: N/A;   LEFT HEART CATHETERIZATION WITH CORONARY ANGIOGRAM N/A 07/14/2012   Procedure: LEFT HEART CATHETERIZATION WITH CORONARY ANGIOGRAM;  Surgeon: Jerel Balding, MD;  Location: [facility] CATH LAB;  Service: Cardiovascular;  Laterality: N/A;   PERCUTANEOUS CORONARY STENT INTERVENTION (PCI-S) Right 07/14/2012   Procedure: PERCUTANEOUS CORONARY STENT INTERVENTION (PCI-S);  Surgeon: Jerel Balding, MD;  Location: [facility] CATH LAB;  Service: Cardiovascular;  Laterality: Right;   POLYPECTOMY  04/26/2019   Procedure: POLYPECTOMY;  Surgeon: Mansouraty, Aloha Raddle., MD;  Location: [facility];  Service: Gastroenterology;;   SUBMUCOSAL TATTOO INJECTION  04/25/2019   Procedure: SUBMUCOSAL TATTOO  INJECTION;  Surgeon: Wilhelmenia Aloha Raddle., MD;  Location: [facility];  Service: Gastroenterology;;   Social History   Socioeconomic History   Marital status: Single    Spouse name: Not on file   Number of children: 0   Years of education: Not on file   Highest education level: Not on file  Occupational History   Occupation: retired    Associate Professor: [employer]  Tobacco Use   Smoking status: Never   Smokeless tobacco: Never  Vaping Use   Vaping status: Never Used  Substance and Sexual Activity   Alcohol  use: No   Drug use: No   Sexual activity: Not Currently  Other Topics Concern   Not on file  Social History Narrative   Not on file   Social Drivers of Health   Financial Resource Strain: Low Risk  (10/04/2022)   Overall Financial Resource Strain (CARDIA)    Difficulty of Paying Living Expenses: Not hard at all  Food Insecurity: No Food Insecurity (10/04/2022)   Hunger Vital  Sign    Worried About Programme Researcher, Broadcasting/film/video in the Last Year: Never true    Ran Out of Food in the Last Year: Never true  Transportation Needs: No Transportation Needs (10/04/2022)   PRAPARE - Administrator, Civil Service (Medical): No    Lack of Transportation (Non-Medical): No  Physical Activity: Inactive (10/04/2022)   Exercise Vital Sign    Days of Exercise per Week: 0 days    Minutes of Exercise per Session: 0 min  Stress: No Stress Concern Present (10/04/2022)   [employer] of Occupational Health - Occupational Stress Questionnaire    Feeling of Stress : Not at all  Social Connections: Not on file  Intimate Partner Violence: Not on file   Current Outpatient Medications on File Prior to Visit  Medication Sig Dispense Refill   Accu-Chek FastClix Lancets MISC 1 Units by Does not apply route daily. 100 each 2   Accu-Chek Softclix Lancets lancets 1 each by Other route as directed. Use as instructed 100 each 2   aspirin  81 MG tablet Take 1 tablet (81 mg total) by mouth daily. 30 tablet     Cholecalciferol (VITAMIN D3) 50 MCG (2000 UT) TABS Take 1 tablet by mouth daily.     Continuous Blood Gluc Receiver (FREESTYLE LIBRE 2 READER) DEVI 1 each by Does not apply route daily. 1 each 3   Continuous Blood Gluc Sensor (FREESTYLE LIBRE 2 SENSOR) MISC 1 each by Does not apply route every 14 (fourteen) days. 6 each 3   escitalopram  (LEXAPRO ) 10 MG tablet Take 1 tablet (10 mg total) by mouth daily. 90 tablet 3   insulin  aspart (NOVOLOG ) 100 UNIT/ML injection Inject 10 Units into the skin 3 (three) times daily before meals.     Insulin  Pen Needle (B-D UF III MINI PEN NEEDLES) 31G X 5 MM MISC USE TO INJECT INSULIN  4x DAILY 400 each 3   LANTUS  SOLOSTAR 100 UNIT/ML Solostar Pen Inject 60 Units into the skin daily. 75 mL 3   lipase/protease/amylase (CREON ) 36000 UNITS CPEP capsule TAKE 2 CAPSULES BY MOUTH THREE TIMES DAILY WITH MEALS.  MAY ALSO TAKE 1 CAPSULE AS NEEDED WITH  SNACKS. 240 capsule 3   lisinopril  (ZESTRIL ) 10 MG tablet Take 0.5 tablets (5 mg total) by mouth daily. May take 1 tablet (10 mg) on days when BP > 150 systolic. 90 tablet 3   metoprolol  succinate (TOPROL -XL) 25 MG 24 hr tablet Take 1 tablet (25 mg total) by mouth daily. 90 tablet 3   Multiple Vitamins-Minerals (CENTRUM SILVER ULTRA MENS PO) Take 1 tablet by mouth daily.      ONETOUCH ULTRA TEST test strip USE 1 STRIP TO CHECK GLUCOSE 4 TIMES DAILY 150 each 0   pantoprazole  (PROTONIX ) 40 MG tablet Take 1 tablet by mouth once daily 90 tablet 3   rosuvastatin  (CRESTOR ) 40 MG tablet TAKE 1 TABLET BY MOUTH AT BEDTIME 30 tablet 0   No current facility-administered medications on file prior to visit.   Allergies  Allergen Reactions   Lipitor [Atorvastatin ] Other (See Comments)    Muscle weakness   Metformin  And Related Diarrhea    Severe diarrhea   Family History  Problem Relation Age of Onset   Diabetes Mother    Diabetes Paternal Grandmother    Diabetes Maternal Grandfather    Colon cancer Neg Hx    PE: BP (!)  172/80   Pulse 92   Ht 6' (1.829 m)   Wt 227 lb  12.8 oz (103.3 kg)   SpO2 96%   BMI 30.90 kg/m  Wt Readings from Last 3 Encounters:  10/06/23 227 lb 12.8 oz (103.3 kg)  10/04/23 226 lb (102.5 kg)  06/29/23 220 lb (99.8 kg)   Constitutional: overweight, in NAD Eyes:  EOMI, no exophthalmos ENT: no neck masses, no cervical lymphadenopathy Cardiovascular:RRR, No MRG Respiratory: CTA B Musculoskeletal: no deformities except for left transmetatarsal amputation  Skin:no rashes Neurological: no tremor with outstretched hands  ASSESSMENT: 1. 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 HL  PLAN:  1. Patient with longstanding, uncontrolled, insulin -dependent, on basal-bolus insulin  regimen, with still suboptimal control.  At last visit, HbA1c was higher, at 8.6%, but he had another HbA1c 2 days ago which was even higher, at 8.9%.  At last visit, sugars were quite high, fluctuating around the upper limit of the target range but with higher values after lunch and also in the evening.  He had some low blood sugars at the time of the visit probably due to changing his sensor.  I did advise him that sugars may be lower in the 12 hours after changing the sensor and he needs to check with his glucometer to confirm the values.  He was not injecting NovoLog  before meals if the sugars were normal.  Also, he was using a fixed dose of NovoLog , 10 units before each meal, and not changing the doses as recommended.  He was having a sliding scale but occasionally sugars are dropping too low after correction so we discussed about not bolusing more than 4 units for correction.  I also strongly recommended to inject NovoLog  at the dose depending on the size of his meals and always inject 15 minutes before the  meal.  He was also trying to improve his diet.  I did not recommend a change in the Lantus  dose. CGM interpretation: -At today's visit, we reviewed his CGM downloads: It appears that 27% of values are in target range (goal >70%), while 73% are higher than 180 (goal <25%), and 0% are lower than 70 (goal <4%).  The calculated average blood sugar is 241.  The projected HbA1c for the next 3 months (GMI) is 9.1%. -Reviewing the CGM trends, sugars appear to be quite elevated, with the highest blood sugars during the night, but fluctuating blood sugars otherwise.  He is injecting his Lantus  dose in a fairly narrow area on the abdomen and I advised him to rotate the injection sites more any findings to size.  I also recommended to split the dose in 2 to also take a dose at bedtime to hopefully help blood sugars overnight.  We did discuss about potentially adding a GLP-1 receptor agonist but he has a history of pancreatic cyst with pancreatic insufficiency and is on pancreatic enzymes.  I reticence to suggest to add this now, even though the initial cause for his pancreatitis was cholelithiasis and his gallbladder was removed. -For now, I suggested to continue the same doses of NovoLog .  He is doing better injecting NovoLog  15 minutes before meals and adjusting the dose based on the size of his meals. -He had another HbA1c obtained 2 days ago and this was higher, at 8.9% -We did discuss that a possible cause for his high blood sugars could be his anxiety.  PCP  suggested antianxiety medication and  we discussed that this may help with both high blood sugars and high blood pressure. - I suggested to:  Patient Instructions  Please split: - Lantus  30 units in am and 30 units at night - try the sides of the abdomen or the thighs  Continue: - NovoLog  15 min before a meal 10 units before a smaller meal 12 unite before a regular meal 14 units before a larger meal - Add the following Novolog  sliding scale: -  150-175: + 1 unit  - 176-200: + 2 units  - 201-225: + 3 units  - >225 + 4 units   Please return for another visit in 3-4 months.   - advised to check sugars at different times of the day - 4x a day, rotating check times - advised for yearly eye exams >> he is UTD - return to clinic in 3-4 months  2. HL -Latest lipid panel from 2 days ago was reviewed: HDL slightly low, otherwise fractions at goal Lab Results  Component Value Date   CHOL 93 10/04/2023   HDL 30.70 (L) 10/04/2023   LDLCALC 47 10/04/2023   TRIG 79.0 10/04/2023   CHOLHDL 3 10/04/2023  -He is on Crestor  40 mg daily without side effects  Lela Fendt, MD PhD [facility] Endocrinology

## 2023-10-07 ENCOUNTER — Other Ambulatory Visit: Payer: Self-pay | Admitting: Family Medicine

## 2023-10-07 ENCOUNTER — Telehealth: Payer: Self-pay

## 2023-10-07 DIAGNOSIS — I5042 Chronic combined systolic (congestive) and diastolic (congestive) heart failure: Secondary | ICD-10-CM

## 2023-10-07 LAB — MICROALBUMIN / CREATININE URINE RATIO
Creatinine, Urine: 108 mg/dL (ref 20–320)
Microalb Creat Ratio: 12 mg/g{creat} (ref <30)
Microalb, Ur: 1.3 mg/dL

## 2023-10-07 NOTE — Telephone Encounter (Signed)
 Patient given results  as directed by MD.

## 2023-10-07 NOTE — Telephone Encounter (Signed)
-----   Message from Carlus Pavlov sent at 10/07/2023  8:27 AM EST ----- Can you please call pt.:  His urinary proteins are normal.

## 2023-10-31 ENCOUNTER — Other Ambulatory Visit: Payer: Self-pay | Admitting: Family Medicine

## 2023-10-31 ENCOUNTER — Ambulatory Visit: Payer: Self-pay | Admitting: Family Medicine

## 2023-10-31 DIAGNOSIS — E089 Diabetes mellitus due to underlying condition without complications: Secondary | ICD-10-CM

## 2023-10-31 MED ORDER — INSULIN ASPART 100 UNIT/ML CARTRIDGE (PENFILL): SUBCUTANEOUS | 0 refills | Status: DC

## 2023-10-31 NOTE — Telephone Encounter (Signed)
Copied from CRM [id number]. Topic: Clinical - Medication Refill >> Oct 31, 2023  1:26 PM Florestine Avers wrote: Most Recent Primary Care Visit:  Provider: Loyola Mast  Department: [facility]  Visit Type: OFFICE VISIT  Date: 10/04/2023  Medication: Hemalog, it is not on the patients list.  Has the patient contacted their pharmacy? Yes (Agent: If no, request that the patient contact the pharmacy for the refill. If patient does not wish to contact the pharmacy document the reason why and proceed with request.) (Agent: If yes, when and what did the pharmacy advise?)  Is this the correct pharmacy for this prescription? Yes If no, delete pharmacy and type the correct one.  This is the patient's preferred pharmacy:  [facility] [address] [place] - [address] [place] [address] Phone: [phone] Fax: [phone]  [id number] [place], [place] - [address] AT [facility] [address] [place] [address] Phone: [phone] Fax: [phone]   Has the prescription been filled recently? No  Is the patient out of the medication? Yes  Has the patient been seen for an appointment in the last year OR does the patient have an upcoming appointment? Yes  Can we respond through MyChart? No  Agent: Please be advised that Rx refills may take up to 3 business days. We ask that you follow-up with your pharmacy.

## 2023-10-31 NOTE — Telephone Encounter (Signed)
Chief Complaint: Hyperglycemia Symptoms: no symptoms at this time Frequency: Today Pertinent Negatives: Patient denies symptoms of hyperglycemia Disposition: [] ED /[] Urgent Care (no appt availability in office) / [] Appointment(In office/virtual)/ []  [facility]/ [] Home Care/ [] Refused Recommended Disposition /[] [facility]/ [x]  Follow-up with PCP Additional Notes: Patient called this morning to put in a refill request on his short-acting insulin, and medication was not refilled. Patient states pharmacy told him they sent in authorization to doctor that wasn't completed. Patient called back at 5 pm stating he is completely out of insulin and his blood sugar is 395 as of a few minutes ago. Patient is anxious that his glucose will rise throughout the night. This RN called [facility] and office was closed. This RN called pharmacist to ask if emergency refill could be prescribed but pharmacist stated this is not allowed with this medication. Advised patient this will be sent High Priority to physician. Advised patient if he feels symptomatic throughout the evening or if glucose levels get to high, he will need to visit the ER or an UC. Patient verbalized understanding.    Copied from CRM [id number]. Topic: Clinical - Red Word Triage >> Oct 31, 2023  4:57 PM Taleah C wrote: Red Word that prompted transfer to Nurse Triage: pt called and stated that he doesn't have his blood sugar medicine and his blood sugar reads 395. He is concerned because he still doesn't have it. Additional Information . Commented on: All Negative - Home Care    Patient ran out of short acting insulin today and refill request has been sent in. Patient has Lantus at home. Advised patient to go to nearest ED or UC if blood glucose remains high and he becomes symptomatic until refill can be completed in the morning. See Nurse Triage Notes  Answer Assessment - Initial Assessment Questions 1. BLOOD GLUCOSE: "What is your blood  glucose level?"      395 2. ONSET: "When did you check the blood glucose?"     5 min ago 3. USUAL RANGE: "What is your glucose level usually?" (e.g., usual fasting morning value, usual evening value)     Around 250  5. TYPE 1 or 2:  "Do you know what type of diabetes you have?"  (e.g., Type 1, Type 2, Gestational; doesn't know)      Type 2 6. INSULIN: "Do you take insulin?" "What type of insulin(s) do you use? What is the mode of delivery? (syringe, pen; injection or pump)?"      Yes - Novolog and Lantus 7. DIABETES PILLS: "Do you take any pills for your diabetes?" If Yes, ask: "Have you missed taking any pills recently?"     No - I took slow release this morning 8. OTHER SYMPTOMS: "Do you have any symptoms?" (e.g., fever, frequent urination, difficulty breathing, dizziness, weakness, vomiting)     No  Protocols used: Diabetes - High Blood Sugar-A-AH

## 2023-10-31 NOTE — Telephone Encounter (Signed)
LR  hx provider. LOV 10/04/23 FOV   none scheduled.   Please review and advise.  Thanks Dm/cma

## 2023-11-01 NOTE — Telephone Encounter (Signed)
Lft detailed VM and had already sent a my chart message to him. Dm/cma

## 2023-11-14 ENCOUNTER — Other Ambulatory Visit: Payer: Self-pay | Admitting: Internal Medicine

## 2023-12-23 ENCOUNTER — Ambulatory Visit: Payer: Medicare Other

## 2023-12-23 ENCOUNTER — Other Ambulatory Visit: Payer: Self-pay | Admitting: Family Medicine

## 2023-12-23 VITALS — BP 138/70 | HR 80 | Temp 98.6°F | Ht 72.0 in | Wt 212.0 lb

## 2023-12-23 DIAGNOSIS — E7849 Other hyperlipidemia: Secondary | ICD-10-CM

## 2023-12-23 DIAGNOSIS — Z0000 Encounter for general adult medical examination without abnormal findings: Secondary | ICD-10-CM

## 2023-12-23 DIAGNOSIS — E089 Diabetes mellitus due to underlying condition without complications: Secondary | ICD-10-CM

## 2023-12-23 MED ORDER — INSULIN ASPART 100 UNIT/ML CARTRIDGE (PENFILL)
SUBCUTANEOUS | 11 refills | Status: AC
Start: 2023-12-23 — End: ?

## 2023-12-23 MED ORDER — ROSUVASTATIN CALCIUM 40 MG PO TABS: ORAL_TABLET | ORAL | 3 refills | Status: AC

## 2023-12-23 NOTE — Patient Instructions (Signed)
 [name] , Thank you for taking time to come for your Medicare Wellness Visit. I appreciate your ongoing commitment to your health goals. Please review the following plan we discussed and let me know if I can assist you in the future.   Referrals/Orders/Follow-Ups/Clinician Recommendations: none  This is a list of the screening recommended for you and due dates:  Health Maintenance  Topic Date Due   Eye exam for diabetics  Never done   Hepatitis C Screening  Never done   Zoster (Shingles) Vaccine (1 of 2) Never done   COVID-19 Vaccine (1 - 2024-25 season) Never done   DTaP/Tdap/Td vaccine (2 - Tdap) 12/05/2023   Flu Shot  12/26/2023*   Hemoglobin A1C  04/02/2024   Complete foot exam   06/09/2024   Yearly kidney function blood test for diabetes  10/03/2024   Yearly kidney health urinalysis for diabetes  10/05/2024   Medicare Annual Wellness Visit  12/22/2024   Colon Cancer Screening  04/25/2029   Pneumonia Vaccine  Completed   HPV Vaccine  Aged Out  *Topic was postponed. The date shown is not the original due date.    Advanced directives: (Copy Requested) Please bring a copy of your health care power of attorney and living will to the office to be added to your chart at your convenience. You can mail to [facility] [address] [place], [place] [address] or email to [email or web address]  Next Medicare Annual Wellness Visit scheduled for next year: Yes  insert Preventive Care attachment Insert FALL PREVENTION attachment if needed

## 2023-12-23 NOTE — Progress Notes (Signed)
 Subjective:   David Lane is a 78 y.o. who presents for a Medicare Wellness preventive visit.  Visit Complete: In person    Persons Participating in Visit: Patient.  AWV Questionnaire: No: Patient Medicare AWV questionnaire was not completed prior to this visit.  Cardiac Risk Factors include: advanced age (>43men, >3 women);diabetes mellitus;dyslipidemia;hypertension;male gender     Objective:    Today's Vitals   12/23/23 1446  BP: 138/70  Pulse: 80  Temp: 98.6 F (37 C)  TempSrc: Oral  SpO2: 96%  Weight: 212 lb (96.2 kg)  Height: 6' (1.829 m)   Body mass index is 28.75 kg/m.     12/23/2023    3:16 PM 10/04/2022    1:12 PM 08/21/2019    4:38 PM 04/24/2019    4:14 AM 04/23/2019    6:26 PM 01/20/2018    7:53 PM 01/13/2018    1:00 AM  Advanced Directives  Does Patient Have a Medical Advance Directive? Yes Yes No  No Yes Yes  Type of Advance Directive Living will Healthcare Power of [place];Living will    Healthcare Power of [employer] Power of Attorney  Does patient want to make changes to medical advance directive?      No - Patient declined No - Patient declined  Copy of Healthcare Power of Attorney in Chart?  No - copy requested    No - copy requested No - copy requested  Would patient like information on creating a medical advance directive?   No - Patient declined No - Patient declined  No - Patient declined No - Patient declined    Current Medications (verified) Outpatient Encounter Medications as of 12/23/2023  Medication Sig   Accu-Chek FastClix Lancets MISC 1 Units by Does not apply route daily.   Accu-Chek Softclix Lancets lancets 1 each by Other route as directed. Use as instructed   aspirin 81 MG tablet Take 1 tablet (81 mg total) by mouth daily.   Cholecalciferol (VITAMIN D3) 50 MCG (2000 UT) TABS Take 1 tablet by mouth daily.   Continuous Blood Gluc Receiver (FREESTYLE LIBRE 2 READER) DEVI 1 each by Does not apply route daily.   Continuous  Glucose Sensor (FREESTYLE LIBRE 2 SENSOR) MISC APPLY ONE SENSOR EVERY 14 DAYS   escitalopram (LEXAPRO) 10 MG tablet Take 1 tablet (10 mg total) by mouth daily.   insulin aspart (NOVOLOG) cartridge - NovoLog 15 min before a meal 10 units before a smaller meal, 12 units before a regular meal, 14 units before a larger meal - Add the following Novolog sliding scale: for glucose of 150-175: + 1 unit, 176-200: + 2 units, 201-225: + 3 units, >225 + 4 units   Insulin Pen Needle (B-D UF III MINI PEN NEEDLES) 31G X 5 MM MISC USE TO INJECT INSULIN 4x DAILY   LANTUS SOLOSTAR 100 UNIT/ML Solostar Pen Inject 60 Units into the skin daily.   lisinopril (ZESTRIL) 10 MG tablet Take 0.5 tablets (5 mg total) by mouth daily. May take 1 tablet (10 mg) on days when BP > 150 systolic.   metoprolol succinate (TOPROL-XL) 25 MG 24 hr tablet Take 1 tablet by mouth once daily   Multiple Vitamins-Minerals (CENTRUM SILVER ULTRA MENS PO) Take 1 tablet by mouth daily.    ONETOUCH ULTRA TEST test strip USE 1 STRIP TO CHECK GLUCOSE 4 TIMES DAILY   pantoprazole (PROTONIX) 40 MG tablet Take 1 tablet by mouth once daily   rosuvastatin (CRESTOR) 40 MG tablet TAKE 1 TABLET  BY MOUTH AT BEDTIME   lipase/protease/amylase (CREON) 36000 UNITS CPEP capsule TAKE 2 CAPSULES BY MOUTH THREE TIMES DAILY WITH MEALS.  MAY ALSO TAKE 1 CAPSULE AS NEEDED WITH  SNACKS. (Patient not taking: Reported on 12/23/2023)   No facility-administered encounter medications on file as of 12/23/2023.    Allergies (verified) Lipitor [atorvastatin] and Metformin and related   History: Past Medical History:  Diagnosis Date   CAD (coronary artery disease)    last cath by [facility] DR.  Mihai Croitoru showing  some  disease involving LCX and small size of Diag    CHF exacerbation, due to diastolic dysfunction 07/17/2012   Chronic systolic CHF (congestive heart failure), NYHA class 1 (HCC) 07/17/2012   GERD (gastroesophageal reflux disease)    Hepatic lesion 02/04/11    Hyperlipemia    Hypertension    Ischemic cardiomyopathy    EF 35-40%   Liver hemangioma    NSTEMI (non-ST elevated myocardial infarction) (HCC) 11/21/2009   Pancreatitis 2000's   Shortness of breath    Type II diabetes mellitus (HCC)    Past Surgical History:  Procedure Laterality Date   AMPUTATION Left 01/14/2018   Procedure: TRANSMETATARSAL AMPUTATION;  Surgeon: Nadara Mustard, MD;  Location: [facility] OR;  Service: Orthopedics;  Laterality: Left;   BIOPSY  04/26/2019   Procedure: BIOPSY;  Surgeon: Lemar Lofty., MD;  Location: [facility] ENDOSCOPY;  Service: Gastroenterology;;   CARDIAC CATHETERIZATION  2011   minimal disease, medical management   COLONOSCOPY WITH PROPOFOL N/A 04/26/2019   Procedure: COLONOSCOPY WITH PROPOFOL;  Surgeon: Meridee Score Netty Starring., MD;  Location: [facility] ENDOSCOPY;  Service: Gastroenterology;  Laterality: N/A;   CORONARY ANGIOPLASTY WITH STENT PLACEMENT  07/14/2012   successful PCI & stenting of mid LAD & PDA off the dominant CX   CORONARY/GRAFT ACUTE MI REVASCULARIZATION N/A 12/20/2017   Procedure: Coronary/Graft Acute MI Revascularization;  Surgeon: Swaziland, Peter M, MD;  Location: [facility] INVASIVE CV LAB;  Service: Cardiovascular;  Laterality: N/A;   ENTEROSCOPY N/A 04/25/2019   Procedure: ENTEROSCOPY;  Surgeon: Meridee Score Netty Starring., MD;  Location: [facility] ENDOSCOPY;  Service: Gastroenterology;  Laterality: N/A;   ESOPHAGOGASTRODUODENOSCOPY Left 04/24/2019   Procedure: ESOPHAGOGASTRODUODENOSCOPY (EGD);  Surgeon: Shellia Cleverly, DO;  Location: [facility] ENDOSCOPY;  Service: Gastroenterology;  Laterality: Left;   HEMOSTASIS CLIP PLACEMENT  04/26/2019   Procedure: HEMOSTASIS CLIP PLACEMENT;  Surgeon: Lemar Lofty., MD;  Location: [facility] ENDOSCOPY;  Service: Gastroenterology;;   HOT HEMOSTASIS N/A 04/26/2019   Procedure: HOT HEMOSTASIS (ARGON PLASMA COAGULATION/BICAP);  Surgeon: Lemar Lofty., MD;  Location: [facility]OS[facility];  Service: Gastroenterology;  Laterality: N/A;    INCISION AND DRAINAGE ABSCESS ANAL  1970's   LEFT HEART CATH AND CORONARY ANGIOGRAPHY N/A 12/20/2017   Procedure: LEFT HEART CATH AND CORONARY ANGIOGRAPHY;  Surgeon: Swaziland, Peter M, MD;  Location: [facility] INVASIVE CV LAB;  Service: Cardiovascular;  Laterality: N/A;   LEFT HEART CATHETERIZATION WITH CORONARY ANGIOGRAM N/A 07/14/2012   Procedure: LEFT HEART CATHETERIZATION WITH CORONARY ANGIOGRAM;  Surgeon: Thurmon Fair, MD;  Location: [facility] CATH LAB;  Service: Cardiovascular;  Laterality: N/A;   PERCUTANEOUS CORONARY STENT INTERVENTION (PCI-S) Right 07/14/2012   Procedure: PERCUTANEOUS CORONARY STENT INTERVENTION (PCI-S);  Surgeon: Thurmon Fair, MD;  Location: [facility] CATH LAB;  Service: Cardiovascular;  Laterality: Right;   POLYPECTOMY  04/26/2019   Procedure: POLYPECTOMY;  Surgeon: Meridee Score Netty Starring., MD;  Location: [facility]OSCOPY;  Service: Gastroenterology;;   SUBMUCOSAL TATTOO INJECTION  04/25/2019   Procedure: SUBMUCOSAL TATTOO INJECTION;  Surgeon: Corliss Parish  Montez Hageman., MD;  Location: [facility]OSCOPY;  Service: Gastroenterology;;   Family History  Problem Relation Age of Onset   Diabetes Mother    Diabetes Paternal Grandmother    Diabetes Maternal Grandfather    Colon cancer Neg Hx    Social History   Socioeconomic History   Marital status: Single    Spouse name: Not on file   Number of children: 0   Years of education: Not on file   Highest education level: Not on file  Occupational History   Occupation: retired    Associate Professor: [employer]  Tobacco Use   Smoking status: Never   Smokeless tobacco: Never  Vaping Use   Vaping status: Never Used  Substance and Sexual Activity   Alcohol use: No   Drug use: No   Sexual activity: Not Currently  Other Topics Concern   Not on file  Social History Narrative   Not on file   Social Drivers of Health   Financial Resource Strain: Low Risk  (12/23/2023)   Overall Financial Resource Strain (CARDIA)    Difficulty of Paying Living Expenses:  Not hard at all  Food Insecurity: No Food Insecurity (12/23/2023)   Hunger Vital Sign    Worried About Running Out of Food in the Last Year: Never true    Ran Out of Food in the Last Year: Never true  Transportation Needs: No Transportation Needs (12/23/2023)   PRAPARE - Administrator, Civil Service (Medical): No    Lack of Transportation (Non-Medical): No  Physical Activity: Inactive (12/23/2023)   Exercise Vital Sign    Days of Exercise per Week: 0 days    Minutes of Exercise per Session: 0 min  Stress: No Stress Concern Present (10/04/2022)   [employer] of Occupational Health - Occupational Stress Questionnaire    Feeling of Stress : Not at all  Social Connections: Unknown (12/23/2023)   Social Connection and Isolation Panel [NHANES]    Frequency of Communication with Friends and Family: Three times a week    Frequency of Social Gatherings with Friends and Family: Twice a week    Attends Religious Services: Never    Database administrator or Organizations: No    Attends Engineer, structural: Never    Marital Status: Not on file    Tobacco Counseling Counseling given: Not Answered    Clinical Intake:  Pre-visit preparation completed: Yes  Pain : No/denies pain     Nutritional Status: BMI 25 -29 Overweight Nutritional Risks: None Diabetes: Yes CBG done?: No Did pt. bring in CBG monitor from home?: No  Lab Results  Component Value Date   HGBA1C 8.9 (H) 10/04/2023   HGBA1C 8.6 (A) 06/10/2023   HGBA1C 8.4 (A) 02/03/2023     How often do you need to have someone help you when you read instructions, pamphlets, or other written materials from your doctor or pharmacy?: 1 - Never  Interpreter Needed?: No  Information entered by :: NAllen LPN   Activities of Daily Living     12/23/2023    2:55 PM  In your present state of health, do you have any difficulty performing the following activities:  Hearing? 0  Vision? 0  Difficulty  concentrating or making decisions? 0  Walking or climbing stairs? 0  Dressing or bathing? 0  Doing errands, shopping? 0  Preparing Food and eating ? N  Using the Toilet? N  In the past six months, have you accidently leaked urine? N  Do you have problems with loss of bowel control? N  Managing your Medications? N  Managing your Finances? N  Housekeeping or managing your Housekeeping? N    Patient Care Team: Loyola Mast, MD as PCP - General (Family Medicine) Nadara Mustard, MD as Consulting Physician (Orthopedic Surgery) Thurmon Fair, MD as Consulting Physician (Cardiology) Carlus Pavlov, MD as Consulting Physician (Endocrinology)  Indicate any recent Medical Services you may have received from other than [facility] providers in the past year (date may be approximate).     Assessment:   This is a routine wellness examination for [place].  Hearing/Vision screen Hearing Screening - Comments:: Denies hearing issues Vision Screening - Comments:: No regular eye exams   Goals Addressed             This Visit's Progress    Patient Stated       12/23/2023,  continue to eat healthy       Depression Screen     12/23/2023    3:21 PM 06/29/2023    3:04 PM 10/04/2022    1:15 PM 01/07/2022   10:47 AM 10/27/2020    2:15 PM 12/05/2014    3:22 PM  PHQ 2/9 Scores  PHQ - 2 Score 0 0 0 0 0 0    Fall Risk     12/23/2023    3:19 PM 06/29/2023    3:03 PM 10/11/2022    2:45 PM 10/04/2022    1:14 PM 01/07/2022   10:47 AM  Fall Risk   Falls in the past year? 0 0 0 0 0  Number falls in past yr: 0 0 0 0 0  Injury with Fall? 0 0 0 0 0  Risk for fall due to : Medication side effect No Fall Risks No Fall Risks Medication side effect No Fall Risks  Follow up Falls prevention discussed;Falls evaluation completed  Falls evaluation completed Falls prevention discussed;Education provided;Falls evaluation completed Falls evaluation completed    MEDICARE RISK AT HOME:  Medicare Risk at  Home Any stairs in or around the home?: Yes If so, are there any without handrails?: No Home free of loose throw rugs in walkways, pet beds, electrical cords, etc?: Yes Adequate lighting in your home to reduce risk of falls?: Yes Life alert?: No Use of a cane, walker or w/c?: No Grab bars in the bathroom?: No Shower chair or bench in shower?: No Elevated toilet seat or a handicapped toilet?: No  TIMED UP AND GO:  Was the test performed?  Yes  Length of time to ambulate 10 feet: 6 sec Gait slow and steady without use of assistive device  Cognitive Function: Normal: Normal cognitive status assessed by direct observation by this Clinical Health Advisor. No abnormalities found. Patient is able to answer questions in an accurate and timely manner.        10/04/2022    1:16 PM  6CIT Screen  What Year? 0 points  What month? 0 points  What time? 0 points  Count back from 20 2 points  Months in reverse 2 points  Repeat phrase 4 points  Total Score 8 points    Immunizations Immunization History  Administered Date(s) Administered   PNEUMOCOCCAL CONJUGATE-20 10/04/2023   Pneumococcal Conjugate-13 12/05/2014   Td 12/04/2013    Screening Tests Health Maintenance  Topic Date Due   OPHTHALMOLOGY EXAM  Never done   Hepatitis C Screening  Never done   Zoster Vaccines- Shingrix (1 of 2) Never done  COVID-19 Vaccine (1 - 2024-25 season) Never done   DTaP/Tdap/Td (2 - Tdap) 12/05/2023   INFLUENZA VACCINE  12/26/2023 (Originally 04/28/2023)   HEMOGLOBIN A1C  04/02/2024   FOOT EXAM  06/09/2024   Diabetic kidney evaluation - eGFR measurement  10/03/2024   Diabetic kidney evaluation - Urine ACR  10/05/2024   Medicare Annual Wellness (AWV)  12/22/2024   Colonoscopy  04/25/2029   Pneumonia Vaccine 39+ Years old  Completed   HPV VACCINES  Aged Out    Health Maintenance  Health Maintenance Due  Topic Date Due   OPHTHALMOLOGY EXAM  Never done   Hepatitis C Screening  Never done    Zoster Vaccines- Shingrix (1 of 2) Never done   COVID-19 Vaccine (1 - 2024-25 season) Never done   DTaP/Tdap/Td (2 - Tdap) 12/05/2023   Health Maintenance Items Addressed: Declines vaccines  Additional Screening:  Vision Screening: Recommended annual ophthalmology exams for early detection of glaucoma and other disorders of the eye.  Dental Screening: Recommended annual dental exams for proper oral hygiene  Community Resource Referral / Chronic Care Management: CRR required this visit?  No   CCM required this visit?  No     Plan:     I have personally reviewed and noted the following in the patient's chart:   Medical and social history Use of alcohol, tobacco or illicit drugs  Current medications and supplements including opioid prescriptions. Patient is not currently taking opioid prescriptions. Functional ability and status Nutritional status Physical activity Advanced directives List of other physicians Hospitalizations, surgeries, and ER visits in previous 12 months Vitals Screenings to include cognitive, depression, and falls Referrals and appointments  In addition, I have reviewed and discussed with patient certain preventive protocols, quality metrics, and best practice recommendations. A written personalized care plan for preventive services as well as general preventive health recommendations were provided to patient.     Barb Merino, LPN   [id number]   After Visit Summary: (In Person-Printed) AVS printed and given to the patient  Notes: Nothing significant to report at this time.

## 2024-01-11 ENCOUNTER — Ambulatory Visit (INDEPENDENT_AMBULATORY_CARE_PROVIDER_SITE_OTHER): Admitting: Family Medicine

## 2024-01-11 ENCOUNTER — Encounter: Payer: Self-pay | Admitting: Family Medicine

## 2024-01-11 VITALS — BP 154/76 | HR 76 | Temp 98.0°F | Ht 72.0 in | Wt 214.0 lb

## 2024-01-11 DIAGNOSIS — E089 Diabetes mellitus due to underlying condition without complications: Secondary | ICD-10-CM

## 2024-01-11 DIAGNOSIS — I2510 Atherosclerotic heart disease of native coronary artery without angina pectoris: Secondary | ICD-10-CM

## 2024-01-11 DIAGNOSIS — K8689 Other specified diseases of pancreas: Secondary | ICD-10-CM

## 2024-01-11 DIAGNOSIS — E7849 Other hyperlipidemia: Secondary | ICD-10-CM | POA: Diagnosis not present

## 2024-01-11 DIAGNOSIS — E1142 Type 2 diabetes mellitus with diabetic polyneuropathy: Secondary | ICD-10-CM | POA: Insufficient documentation

## 2024-01-11 DIAGNOSIS — I10 Essential (primary) hypertension: Secondary | ICD-10-CM

## 2024-01-11 MED ORDER — HYDRALAZINE HCL 10 MG PO TABS: 10.0000 mg | ORAL_TABLET | Freq: Three times a day (TID) | ORAL | 2 refills | Status: AC | PRN

## 2024-01-11 NOTE — Assessment & Plan Note (Signed)
 Continue to follow with Dr. Aldona Amel. I will check his A1c today. Continue insulin glargine 30 units bid and insulin lispro sliding scale.

## 2024-01-11 NOTE — Assessment & Plan Note (Signed)
 Blood pressure is high this afternoon, but David Lane did not take his lisinopril today. His home BP this morning was 110.60. Continue metoprolol succinate 25 mg daily and lisinopril 10 mg tablet 1/2 tab (5 mg) as his usual daily dose and a full tablet when his systolic is > 150. I will also provide him a small supply of hydralazine 10 mg to take if his systolic BP > 160.

## 2024-01-11 NOTE — Assessment & Plan Note (Signed)
Lipids are at goal. Continue rosuvastatin 40 mg daily.

## 2024-01-11 NOTE — Progress Notes (Signed)
 [facility] [address] [place] [address] Dept: [phone] Dept Fax: [phone]  Chronic Care Office Visit  Subjective:    Patient ID: David Lane, male    DOB: [birth year], 77 y.o..   MRN: [id number]  Chief Complaint  Patient presents with   Hypertension    3 month f/u HTN.     History of Present Illness:  Patient is in today for reassessment of chronic medical issues.  David Lane has a history of coronary artery disease, ischemic cardiomyopathy, and heart failure with preserved EF (EF= 55-60%, 01/2020). He is managed currently with metoprolol 25 mg daily, aspirin 81 mg daily, rosuvastatin 40 mg daily, and lisinopril 10 mg 1/2 tab daily. He expresses frustration that his blood pressures can be erratic. At times these are quite high and others they are low and he feels bad. This does cause him some anxiety regarding responding to this.   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 This is now being managed by Dr. Aldona Amel (endocrinology). He has been noted to have labile diabetes. He has a history of complications related to diabetic retinopathy and neuropathy.  He uses a CGM. He did not bring his monitor today. He notes Dr. Aldona Amel has had him split his Lantus dosing to bid, due to concerns that it is not lasting 24 hrs for him. He is on insulin glargine 30 units bid and uses a sliding scale of insulin lispro. At times he finds he does get some low blood sugars.   Past Medical History: Patient Active Problem List   Diagnosis Date Noted   Diabetic retinopathy (HCC) 07/09/2021   Lower urinary tract symptoms (LUTS) 07/09/2021   Onychomycosis 03/17/2021   Skin pimple 03/17/2021   Coronary artery disease involving native coronary artery of native heart without angina pectoris 06/21/2019   AVM (arteriovenous malformation) of colon    History of colonic polyps    Duodenitis     Gastroesophageal reflux disease with esophagitis    Acute upper GI bleed 04/23/2019   Labile blood glucose    Functional gait disorder 01/20/2018   Acute blood loss anemia 01/20/2018   Status post transmetatarsal amputation of left foot (HCC) 01/20/2018   Status post coronary artery stent placement    Acute ST elevation myocardial infarction (STEMI) involving left anterior descending (LAD) coronary artery (HCC) 12/20/2017   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 (HCC) 12/14/2015   Pancreatic insufficiency 01/25/2013   Ischemic cardiomyopathy, EF 50% by echo 08/01/13 07/19/2012   Chronic combined systolic and diastolic CHF (congestive heart failure) (HCC) 07/14/2012   Pulmonary edema, most likely due to diastolic dysfunction 07/14/2012   CAD, 07/14/12- LAD/PDA DES    Anxiety 12/12/2009   Hyperlipidemia 04/24/2007   Essential hypertension 04/24/2007   History of pancreatitis 04/24/2007   Past Surgical History:  Procedure Laterality Date   AMPUTATION Left 01/14/2018   Procedure: TRANSMETATARSAL AMPUTATION;  Surgeon: Timothy Ford, MD;  Location: [facility] OR;  Service: Orthopedics;  Laterality: Left;   BIOPSY  04/26/2019   Procedure: BIOPSY;  Surgeon: Normie Becton., MD;  Location: [facility]COPY;  Service: Gastroenterology;;   CARDIAC CATHETERIZATION  2011   minimal disease, medical management   COLONOSCOPY WITH PROPOFOL N/A 04/26/2019   Procedure: COLONOSCOPY WITH PROPOFOL;  Surgeon: Brice Campi Albino Alu., MD;  Location: [facility];  Service: Gastroenterology;  Laterality: N/A;   CORONARY ANGIOPLASTY WITH STENT PLACEMENT  07/14/2012   successful PCI & stenting  of mid LAD & PDA off the dominant CX   CORONARY/GRAFT ACUTE MI REVASCULARIZATION N/A 12/20/2017   Procedure: Coronary/Graft Acute MI Revascularization;  Surgeon: Swaziland, Peter M, MD;  Location: [facility] INVASIVE CV LAB;  Service: Cardiovascular;  Laterality: N/A;   ENTEROSCOPY N/A 04/25/2019   Procedure: ENTEROSCOPY;   Surgeon: Brice Campi Albino Alu., MD;  Location: [facility];  Service: Gastroenterology;  Laterality: N/A;   ESOPHAGOGASTRODUODENOSCOPY Left 04/24/2019   Procedure: ESOPHAGOGASTRODUODENOSCOPY (EGD);  Surgeon: Annis Kinder, DO;  Location: [facility];  Service: Gastroenterology;  Laterality: Left;   HEMOSTASIS CLIP PLACEMENT  04/26/2019   Procedure: HEMOSTASIS CLIP PLACEMENT;  Surgeon: Normie Becton., MD;  Location: [facility];  Service: Gastroenterology;;   HOT HEMOSTASIS N/A 04/26/2019   Procedure: HOT HEMOSTASIS (ARGON PLASMA COAGULATION/BICAP);  Surgeon: Normie Becton., MD;  Location: [facility];  Service: Gastroenterology;  Laterality: N/A;   INCISION AND DRAINAGE ABSCESS ANAL  1970's   LEFT HEART CATH AND CORONARY ANGIOGRAPHY N/A 12/20/2017   Procedure: LEFT HEART CATH AND CORONARY ANGIOGRAPHY;  Surgeon: Swaziland, Peter M, MD;  Location: [facility] INVASIVE CV LAB;  Service: Cardiovascular;  Laterality: N/A;   LEFT HEART CATHETERIZATION WITH CORONARY ANGIOGRAM N/A 07/14/2012   Procedure: LEFT HEART CATHETERIZATION WITH CORONARY ANGIOGRAM;  Surgeon: Luana Rumple, MD;  Location: [facility] CATH LAB;  Service: Cardiovascular;  Laterality: N/A;   PERCUTANEOUS CORONARY STENT INTERVENTION (PCI-S) Right 07/14/2012   Procedure: PERCUTANEOUS CORONARY STENT INTERVENTION (PCI-S);  Surgeon: Luana Rumple, MD;  Location: [facility] CATH LAB;  Service: Cardiovascular;  Laterality: Right;   POLYPECTOMY  04/26/2019   Procedure: POLYPECTOMY;  Surgeon: Mansouraty, Albino Alu., MD;  Location: [facility];  Service: Gastroenterology;;   SUBMUCOSAL TATTOO INJECTION  04/25/2019   Procedure: SUBMUCOSAL TATTOO INJECTION;  Surgeon: Normie Becton., MD;  Location: [facility];  Service: Gastroenterology;;   Family History  Problem Relation Age of Onset   Diabetes Mother    Diabetes Paternal Grandmother    Diabetes Maternal Grandfather    Colon cancer Neg Hx    Outpatient Medications Prior to Visit   Medication Sig Dispense Refill   Accu-Chek FastClix Lancets MISC 1 Units by Does not apply route daily. 100 each 2   Accu-Chek Softclix Lancets lancets 1 each by Other route as directed. Use as instructed 100 each 2   aspirin 81 MG tablet Take 1 tablet (81 mg total) by mouth daily. 30 tablet    Cholecalciferol (VITAMIN D3) 50 MCG (2000 UT) TABS Take 1 tablet by mouth daily.     Continuous Blood Gluc Receiver (FREESTYLE LIBRE 2 READER) DEVI 1 each by Does not apply route daily. 1 each 3   Continuous Glucose Sensor (FREESTYLE LIBRE 2 SENSOR) MISC APPLY ONE SENSOR EVERY 14 DAYS 6 each 0   escitalopram (LEXAPRO) 10 MG tablet Take 1 tablet (10 mg total) by mouth daily. 90 tablet 3   insulin aspart (NOVOLOG) cartridge - NovoLog 15 min before a meal 10 units before a smaller meal, 12 units before a regular meal, 14 units before a larger meal - Add the following Novolog sliding scale: for glucose of 150-175: + 1 unit, 176-200: + 2 units, 201-225: + 3 units, >225 + 4 units 15 mL 11   Insulin Pen Needle (B-D UF III MINI PEN NEEDLES) 31G X 5 MM MISC USE TO INJECT INSULIN 4x DAILY 400 each 3   LANTUS SOLOSTAR 100 UNIT/ML Solostar Pen Inject 60 Units into the skin daily. 75 mL 3   lisinopril (ZESTRIL) 10 MG  tablet Take 0.5 tablets (5 mg total) by mouth daily. May take 1 tablet (10 mg) on days when BP > 150 systolic. 90 tablet 3   metoprolol succinate (TOPROL-XL) 25 MG 24 hr tablet Take 1 tablet by mouth once daily 90 tablet 3   Multiple Vitamins-Minerals (CENTRUM SILVER ULTRA MENS PO) Take 1 tablet by mouth daily.      ONETOUCH ULTRA TEST test strip USE 1 STRIP TO CHECK GLUCOSE 4 TIMES DAILY 150 each 0   pantoprazole (PROTONIX) 40 MG tablet Take 1 tablet by mouth once daily 90 tablet 3   rosuvastatin (CRESTOR) 40 MG tablet TAKE 1 TABLET BY MOUTH AT BEDTIME 90 tablet 3   lipase/protease/amylase (CREON) 36000 UNITS CPEP capsule TAKE 2 CAPSULES BY MOUTH THREE TIMES DAILY WITH MEALS.  MAY ALSO TAKE 1 CAPSULE AS  NEEDED WITH  SNACKS. (Patient not taking: Reported on 01/11/2024) 240 capsule 3   No facility-administered medications prior to visit.   Allergies  Allergen Reactions   Lipitor [Atorvastatin] Other (See Comments)    Muscle weakness   Metformin And Related Diarrhea    Severe diarrhea   Objective:   Today's Vitals   01/11/24 1438  BP: (!) 154/76  Pulse: 76  Temp: 98 F (36.7 C)  TempSrc: Temporal  SpO2: 99%  Weight: 214 lb (97.1 kg)  Height: 6' (1.829 m)   Body mass index is 29.02 kg/m.   General: Well developed, well nourished. No acute distress. Psych: Alert and oriented. Normal mood and affect.  Health Maintenance Due  Topic Date Due   OPHTHALMOLOGY EXAM  Never done   Hepatitis C Screening  Never done   Zoster Vaccines- Shingrix (1 of 2) Never done   DTaP/Tdap/Td (2 - Tdap) 12/05/2023     Assessment & Plan:   Problem List Items Addressed This Visit       Cardiovascular and Mediastinum   Coronary artery disease involving native coronary artery of native heart without angina pectoris   Relevant Medications   hydrALAZINE (APRESOLINE) 10 MG tablet   Essential hypertension - Primary   Blood pressure is high this afternoon, but Mr. Pieper did not take his lisinopril today. His home BP this morning was 110.60. Continue metoprolol succinate 25 mg daily and lisinopril 10 mg tablet 1/2 tab (5 mg) as his usual daily dose and a full tablet when his systolic is > 150. I will also provide him a small supply of hydralazine 10 mg to take if his systolic BP > 160.      Relevant Medications   hydrALAZINE (APRESOLINE) 10 MG tablet     Digestive   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 (HCC)   Continue to follow with Dr. Aldona Amel. I will check his A1c today. Continue insulin glargine 30 units bid and insulin lispro sliding scale.      Relevant Orders   Glucose, random   Hemoglobin A1c     Other   Hyperlipidemia   Lipids are at goal. Continue rosuvastatin 40  mg daily.      Relevant Medications   hydrALAZINE (APRESOLINE) 10 MG tablet    Return in about 3 months (around 04/11/2024) for Reassessment.   Graig Lawyer, MD

## 2024-01-12 LAB — GLUCOSE, RANDOM: Glucose, Bld: 120 mg/dL — ABNORMAL HIGH (ref 70–99)

## 2024-01-12 LAB — HEMOGLOBIN A1C: Hgb A1c MFr Bld: 9.1 % — ABNORMAL HIGH (ref 4.6–6.5)

## 2024-02-03 ENCOUNTER — Other Ambulatory Visit: Payer: Self-pay | Admitting: Internal Medicine

## 2024-02-03 ENCOUNTER — Encounter: Payer: Self-pay | Admitting: Internal Medicine

## 2024-02-03 ENCOUNTER — Ambulatory Visit (INDEPENDENT_AMBULATORY_CARE_PROVIDER_SITE_OTHER): Payer: Medicare Other | Admitting: Internal Medicine

## 2024-02-03 VITALS — BP 124/70 | HR 111 | Ht 72.0 in | Wt 225.8 lb

## 2024-02-03 DIAGNOSIS — E1159 Type 2 diabetes mellitus with other circulatory complications: Secondary | ICD-10-CM

## 2024-02-03 DIAGNOSIS — E089 Diabetes mellitus due to underlying condition without complications: Secondary | ICD-10-CM

## 2024-02-03 DIAGNOSIS — E1165 Type 2 diabetes mellitus with hyperglycemia: Secondary | ICD-10-CM

## 2024-02-03 DIAGNOSIS — E7849 Other hyperlipidemia: Secondary | ICD-10-CM

## 2024-02-03 DIAGNOSIS — Z794 Long term (current) use of insulin: Secondary | ICD-10-CM | POA: Diagnosis not present

## 2024-02-03 NOTE — Patient Instructions (Addendum)
 Please continue: - Lantus  30 units in am and 30 units at night - NovoLog  15 min before a meal  5 units before a snack or very small meal 8 units before a smaller meal 10 units before a regular meal 12 units before a larger meal - Add the following Novolog  sliding scale: - 150-175: + 1 unit  - 176-200: + 2 units  - 201-225: + 3 units  - >225 + 4 units   Please return for another visit in 3-4 months.

## 2024-02-03 NOTE — Progress Notes (Signed)
 Patient ID: David Lane, male   DOB: [birth year], 78 y.o.   MRN: [id number]  HPI: David Lane is a 78 y.o.-year-old male, returning for follow-up for DM2, dx in 1988 during an episode of gallstone pancreatitis, insulin -dependent since 1990, uncontrolled, with complications (CAD - s/p AMI, CHF, DR, CKD, PN). Pt. previously saw Dr. Washington Hacker, but last visit with me 4 months ago. His sister is also my pt.: Kristin Peyer.  He is here with his daughter.  Interim history: No increased urination, blurry vision, nausea, chest pain.  Reviewed HbA1c: Lab Results  Component Value Date   HGBA1C 9.1 (H) 01/11/2024   HGBA1C 8.9 (H) 10/04/2023   HGBA1C 8.6 (A) 06/10/2023   HGBA1C 8.4 (A) 02/03/2023   HGBA1C 8.9 (A) 10/06/2022   HGBA1C 9.4 (H) 04/29/2022   HGBA1C 8.9 (H) 01/07/2022   HGBA1C 8.8 (H) 10/09/2021   HGBA1C 9.4 (H) 07/09/2021   HGBA1C 8.2 (A) 11/20/2020   Previously on: - Lantus  70 >> 60 units in am - Novolog  5-10 units 3x a day, before meals (vials) >> 8-12 units 3x a day, 15 min before meals (actually using only 10 units) He tried Metformin  >> fatigue and weakness.  Now on: - Lantus  60 units midday >> in am >> 30 units twice a day - NovoLog  10-14 units 3x a day, 15 min before meals >>  10 units before a smaller meal 12 unite before a regular meal 14 units before a larger meal - Novolog  sliding scale: - 150-175: + 1 unit  - 176-200: + 2 units  - 201-225: + 3 units  - 226-250: + 4 units   Pt checks his sugars >4x a day and they are:  Prev.:  Previously:  Previously:   Lowest sugar was 61 >> 50 >> 50s >> 60; he has hypoglycemia awareness at 100.  Highest sugar was 541 >> HI >> .Aaron Aas. 400s.  Glucometer: Accu-Chek/One Touch ultra  - + Mild CKD, last BUN/creatinine:  Lab Results  Component Value Date   BUN 20 10/04/2023   BUN 26 (H) 10/11/2022   CREATININE 1.18 10/04/2023   CREATININE 1.35 10/11/2022   Lab Results  Component Value Date   MICRALBCREAT 12 10/06/2023    MICRALBCREAT 0.8 10/11/2022   MICRALBCREAT 1.8 07/09/2021   MICRALBCREAT 2.8 11/25/2014   MICRALBCREAT 11.2 01/24/2013   MICRALBCREAT 5.1 08/03/2010   MICRALBCREAT 9.8 06/27/2009  On Lisinopril  5 mg  -+ HL; last set of lipids: Lab Results  Component Value Date   CHOL 93 10/04/2023   HDL 30.70 (L) 10/04/2023   LDLCALC 47 10/04/2023   TRIG 79.0 10/04/2023   CHOLHDL 3 10/04/2023  On Crestor  40 mg daily.  - last eye exam was in 2021-2022. + DR - had to have IO injections. Had cataract surgery in the last year.  - no numbness and tingling in his feet.  Last foot exam 06/10/2023.  He also has a history of HTN, GERD, anxiety, onychomycosis.  ROS: + see HPI Past Medical History:  Diagnosis Date   CAD (coronary artery disease)    last cath by [facility] DR.  Mihai Croitoru showing  some  disease involving LCX and small size of Diag    CHF exacerbation, due to diastolic dysfunction 07/17/2012   Chronic systolic CHF (congestive heart failure), NYHA class 1 (HCC) 07/17/2012   GERD (gastroesophageal reflux disease)    Hepatic lesion 02/04/11   Hyperlipemia    Hypertension    Ischemic cardiomyopathy    EF  35-40%   Liver hemangioma    NSTEMI (non-ST elevated myocardial infarction) (HCC) 11/21/2009   Pancreatitis 2000's   Shortness of breath    Type II diabetes mellitus [facility])    Past Surgical History:  Procedure Laterality Date   AMPUTATION Left 01/14/2018   Procedure: TRANSMETATARSAL AMPUTATION;  Surgeon: Timothy Ford, MD;  Location: [facility] OR;  Service: Orthopedics;  Laterality: Left;   BIOPSY  04/26/2019   Procedure: BIOPSY;  Surgeon: Brice Campi Albino Alu., MD;  Location: [facility] ENDOSCOPY;  Service: Gastroenterology;;   CARDIAC CATHETERIZATION  2011   minimal disease, medical management   COLONOSCOPY WITH PROPOFOL  N/A 04/26/2019   Procedure: COLONOSCOPY WITH PROPOFOL ;  Surgeon: Mansouraty, Albino Alu., MD;  Location: [facility];  Service: Gastroenterology;  Laterality: N/A;   CORONARY  ANGIOPLASTY WITH STENT PLACEMENT  07/14/2012   successful PCI & stenting of mid LAD & PDA off the dominant CX   CORONARY/GRAFT ACUTE MI REVASCULARIZATION N/A 12/20/2017   Procedure: Coronary/Graft Acute MI Revascularization;  Surgeon: Swaziland, Peter M, MD;  Location: [facility] INVASIVE CV LAB;  Service: Cardiovascular;  Laterality: N/A;   ENTEROSCOPY N/A 04/25/2019   Procedure: ENTEROSCOPY;  Surgeon: Brice Campi Albino Alu., MD;  Location: [facility] ENDOSCOPY;  Service: Gastroenterology;  Laterality: N/A;   ESOPHAGOGASTRODUODENOSCOPY Left 04/24/2019   Procedure: ESOPHAGOGASTRODUODENOSCOPY (EGD);  Surgeon: Annis Kinder, DO;  Location: [facility] ENDOSCOPY;  Service: Gastroenterology;  Laterality: Left;   HEMOSTASIS CLIP PLACEMENT  04/26/2019   Procedure: HEMOSTASIS CLIP PLACEMENT;  Surgeon: Normie Becton., MD;  Location: [facility];  Service: Gastroenterology;;   HOT HEMOSTASIS N/A 04/26/2019   Procedure: HOT HEMOSTASIS (ARGON PLASMA COAGULATION/BICAP);  Surgeon: Normie Becton., MD;  Location: [facility];  Service: Gastroenterology;  Laterality: N/A;   INCISION AND DRAINAGE ABSCESS ANAL  1970's   LEFT HEART CATH AND CORONARY ANGIOGRAPHY N/A 12/20/2017   Procedure: LEFT HEART CATH AND CORONARY ANGIOGRAPHY;  Surgeon: Swaziland, Peter M, MD;  Location: [facility] INVASIVE CV LAB;  Service: Cardiovascular;  Laterality: N/A;   LEFT HEART CATHETERIZATION WITH CORONARY ANGIOGRAM N/A 07/14/2012   Procedure: LEFT HEART CATHETERIZATION WITH CORONARY ANGIOGRAM;  Surgeon: Luana Rumple, MD;  Location: [facility] CATH LAB;  Service: Cardiovascular;  Laterality: N/A;   PERCUTANEOUS CORONARY STENT INTERVENTION (PCI-S) Right 07/14/2012   Procedure: PERCUTANEOUS CORONARY STENT INTERVENTION (PCI-S);  Surgeon: Luana Rumple, MD;  Location: [facility] CATH LAB;  Service: Cardiovascular;  Laterality: Right;   POLYPECTOMY  04/26/2019   Procedure: POLYPECTOMY;  Surgeon: Mansouraty, Albino Alu., MD;  Location: [facility];  Service:  Gastroenterology;;   SUBMUCOSAL TATTOO INJECTION  04/25/2019   Procedure: SUBMUCOSAL TATTOO INJECTION;  Surgeon: Normie Becton., MD;  Location: [facility];  Service: Gastroenterology;;   Social History   Socioeconomic History   Marital status: Single    Spouse name: Not on file   Number of children: 0   Years of education: Not on file   Highest education level: Not on file  Occupational History   Occupation: retired    Associate Professor: [employer]  Tobacco Use   Smoking status: Never   Smokeless tobacco: Never  Vaping Use   Vaping status: Never Used  Substance and Sexual Activity   Alcohol  use: No   Drug use: No   Sexual activity: Not Currently  Other Topics Concern   Not on file  Social History Narrative   Not on file   Social Drivers of Health   Financial Resource Strain: Low Risk  (12/23/2023)   Overall Financial Resource Strain (CARDIA)  Difficulty of Paying Living Expenses: Not hard at all  Food Insecurity: No Food Insecurity (12/23/2023)   Hunger Vital Sign    Worried About Running Out of Food in the Last Year: Never true    Ran Out of Food in the Last Year: Never true  Transportation Needs: No Transportation Needs (12/23/2023)   PRAPARE - Administrator, Civil Service (Medical): No    Lack of Transportation (Non-Medical): No  Physical Activity: Inactive (12/23/2023)   Exercise Vital Sign    Days of Exercise per Week: 0 days    Minutes of Exercise per Session: 0 min  Stress: No Stress Concern Present (10/04/2022)   [employer] of Occupational Health - Occupational Stress Questionnaire    Feeling of Stress : Not at all  Social Connections: Unknown (12/23/2023)   Social Connection and Isolation Panel [NHANES]    Frequency of Communication with Friends and Family: Three times a week    Frequency of Social Gatherings with Friends and Family: Twice a week    Attends Religious Services: Never    Database administrator or Organizations: No     Attends Banker Meetings: Never    Marital Status: Not on file  Intimate Partner Violence: Not At Risk (12/23/2023)   Humiliation, Afraid, Rape, and Kick questionnaire    Fear of Current or Ex-Partner: No    Emotionally Abused: No    Physically Abused: No    Sexually Abused: No   Current Outpatient Medications on File Prior to Visit  Medication Sig Dispense Refill   Accu-Chek FastClix Lancets MISC 1 Units by Does not apply route daily. 100 each 2   Accu-Chek Softclix Lancets lancets 1 each by Other route as directed. Use as instructed 100 each 2   aspirin  81 MG tablet Take 1 tablet (81 mg total) by mouth daily. 30 tablet    Cholecalciferol (VITAMIN D3) 50 MCG (2000 UT) TABS Take 1 tablet by mouth daily.     Continuous Blood Gluc Receiver (FREESTYLE LIBRE 2 READER) DEVI 1 each by Does not apply route daily. 1 each 3   Continuous Glucose Sensor (FREESTYLE LIBRE 2 SENSOR) MISC APPLY ONE SENSOR EVERY 14 DAYS 6 each 0   escitalopram  (LEXAPRO ) 10 MG tablet Take 1 tablet (10 mg total) by mouth daily. 90 tablet 3   hydrALAZINE  (APRESOLINE ) 10 MG tablet Take 1 tablet (10 mg total) by mouth every 8 (eight) hours as needed (systolic blood pressure > 160.). 15 tablet 2   insulin  aspart (NOVOLOG ) cartridge - NovoLog  15 min before a meal 10 units before a smaller meal, 12 units before a regular meal, 14 units before a larger meal - Add the following Novolog  sliding scale: for glucose of 150-175: + 1 unit, 176-200: + 2 units, 201-225: + 3 units, >225 + 4 units 15 mL 11   Insulin  Pen Needle (B-D UF III MINI PEN NEEDLES) 31G X 5 MM MISC USE TO INJECT INSULIN  4x DAILY 400 each 3   LANTUS  SOLOSTAR 100 UNIT/ML Solostar Pen Inject 60 Units into the skin daily. 75 mL 3   lipase/protease/amylase (CREON ) 36000 UNITS CPEP capsule TAKE 2 CAPSULES BY MOUTH THREE TIMES DAILY WITH MEALS.  MAY ALSO TAKE 1 CAPSULE AS NEEDED WITH  SNACKS. (Patient not taking: Reported on 01/11/2024) 240 capsule 3   lisinopril   (ZESTRIL ) 10 MG tablet Take 0.5 tablets (5 mg total) by mouth daily. May take 1 tablet (10 mg) on days when BP > 150  systolic. 90 tablet 3   metoprolol  succinate (TOPROL -XL) 25 MG 24 hr tablet Take 1 tablet by mouth once daily 90 tablet 3   Multiple Vitamins-Minerals (CENTRUM SILVER ULTRA MENS PO) Take 1 tablet by mouth daily.      ONETOUCH ULTRA TEST test strip USE 1 STRIP TO CHECK GLUCOSE 4 TIMES DAILY 150 each 0   pantoprazole  (PROTONIX ) 40 MG tablet Take 1 tablet by mouth once daily 90 tablet 3   rosuvastatin  (CRESTOR ) 40 MG tablet TAKE 1 TABLET BY MOUTH AT BEDTIME 90 tablet 3   No current facility-administered medications on file prior to visit.   Allergies  Allergen Reactions   Lipitor [Atorvastatin ] Other (See Comments)    Muscle weakness   Metformin  And Related Diarrhea    Severe diarrhea   Family History  Problem Relation Age of Onset   Diabetes Mother    Diabetes Paternal Grandmother    Diabetes Maternal Grandfather    Colon cancer Neg Hx    PE: BP 124/70   Pulse (!) 111   Ht 6' (1.829 m)   Wt 225 lb 12.8 oz (102.4 kg)   SpO2 97%   BMI 30.62 kg/m  Wt Readings from Last 3 Encounters:  02/03/24 225 lb 12.8 oz (102.4 kg)  01/11/24 214 lb (97.1 kg)  12/23/23 212 lb (96.2 kg)   Constitutional: overweight, in NAD Eyes:  EOMI, no exophthalmos ENT: no neck masses, no cervical lymphadenopathy Cardiovascular: Tachycardia, RR, No MRG Respiratory: CTA B Musculoskeletal: no deformities except for left transmetatarsal amputation  Skin:no rashes Neurological: no tremor with outstretched hands  ASSESSMENT: 1. 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 HL  PLAN:  1. Patient with longstanding, uncontrolled, insulin -dependent diabetes, on basal-bolus insulin  regimen,  with suboptimal control.  At the last few checks, HbA1c continued to increase, and this was even higher when checked last, at 9.1%.  Sugars were high at last visit with the highest blood sugars during the night and fluctuating blood sugars otherwise.  He was injecting Lantus  in a relatively narrow area on the abdomen and I advised him to rotate the injection sites, and also use the thighs.  I also advised him to split the dose of Lantus  in 2 doses, taken 12 hours apart.  We did discuss that his blood sugar could have been high due to anxiety.  PCP previously recommended antianxiety medications and we discussed that this could have helped managing his diabetes and BP, also. CGM interpretation: -At today's visit, we reviewed his CGM downloads: It appears that 31% of values are in target range (goal >70%), while 68% are higher than 180 (goal <25%), and 1% are lower than 70 (goal <4%).  The calculated average blood sugar is 203.  The projected HbA1c for the next 3 months (GMI) is 8.2%. -Reviewing the CGM trends, sugars appear to be fluctuating usually above the target range, but he does have very fluctuating blood sugars from the 60s to the 300s.  Upon questioning, he feels that his NovoLog  insulin  doses are too high and he did not try to take lower doses since last visit, but rather to skip the doses completely.  At today's visit we discussed about the importance of varying the doses based on the blood sugar patterns that he sees.  We will decrease the doses  at today's visit and I also advised him that he does not need to inject on a "all or nothing" base but to adjust the dose based on the size of his meals.  We also discussed about what constitutes a small versus large meal and both him and his daughter acknowledged understanding of this.  He was guiding the dose of NovoLog  based on the meal volume and dropping his blood sugars after salads.  We discussed that this would actually be more like a small meal.  This  morning, he ate a banana and grits and he did not bolus anything for it and we discussed that he probably should have injected few units (maybe 5).  Sugars increased to 270s afterwards.  I again underlined the need to bolus 15 minutes before meals, which he is not doing frequently.  I did advise him that if he had to inject after the meal, to inject half of the insulin  dose.  For now, we will continue the same dose of Lantus .  - I suggested to:  Patient Instructions  Please continue: - Lantus  30 units in am and 30 units at night - NovoLog  15 min before a meal  5 units before a snack or very small meal 8 units before a smaller meal 10 units before a regular meal 12 units before a larger meal - Add the following Novolog  sliding scale: - 150-175: + 1 unit  - 176-200: + 2 units  - 201-225: + 3 units  - >225 + 4 units   Please return for another visit in 3-4 months.   - advised to check sugars at different times of the day - 4x a day, rotating check times - advised for yearly eye exams >> he is UTD - return to clinic in 3-4 months  2. HL - Latest lipid panel was reviewed: LDL at goal, HDL low: Lab Results  Component Value Date   CHOL 93 10/04/2023   HDL 30.70 (L) 10/04/2023   LDLCALC 47 10/04/2023   TRIG 79.0 10/04/2023   CHOLHDL 3 10/04/2023  -He continues Crestor  40 mg daily - no SEs  Emilie Harden, MD PhD [facility] Endocrinology

## 2024-02-06 ENCOUNTER — Other Ambulatory Visit: Payer: Self-pay | Admitting: Internal Medicine

## 2024-02-13 ENCOUNTER — Other Ambulatory Visit: Payer: Self-pay | Admitting: Family Medicine

## 2024-02-13 DIAGNOSIS — K2100 Gastro-esophageal reflux disease with esophagitis, without bleeding: Secondary | ICD-10-CM

## 2024-04-26 ENCOUNTER — Other Ambulatory Visit: Payer: Self-pay | Admitting: Internal Medicine

## 2024-04-26 DIAGNOSIS — E089 Diabetes mellitus due to underlying condition without complications: Secondary | ICD-10-CM

## 2024-05-03 ENCOUNTER — Encounter: Payer: Self-pay | Admitting: Internal Medicine

## 2024-05-03 ENCOUNTER — Ambulatory Visit (INDEPENDENT_AMBULATORY_CARE_PROVIDER_SITE_OTHER): Admitting: Internal Medicine

## 2024-05-03 VITALS — BP 130/70 | HR 75 | Ht 72.0 in | Wt 226.0 lb

## 2024-05-03 DIAGNOSIS — E1165 Type 2 diabetes mellitus with hyperglycemia: Secondary | ICD-10-CM

## 2024-05-03 DIAGNOSIS — E7849 Other hyperlipidemia: Secondary | ICD-10-CM

## 2024-05-03 DIAGNOSIS — Z794 Long term (current) use of insulin: Secondary | ICD-10-CM

## 2024-05-03 DIAGNOSIS — E1159 Type 2 diabetes mellitus with other circulatory complications: Secondary | ICD-10-CM | POA: Diagnosis not present

## 2024-05-03 LAB — POCT GLYCOSYLATED HEMOGLOBIN (HGB A1C): Hemoglobin A1C: 9.3 % — AB (ref 4.0–5.6)

## 2024-05-03 NOTE — Progress Notes (Signed)
 Patient ID: David Lane, male   DOB: [birth year], 78 y.o.   MRN: [id number]  HPI: David Lane is a 78 y.o.-year-old male, returning for follow-up for DM2, dx in 1988 during an episode of gallstone pancreatitis, insulin -dependent since 1990, uncontrolled, with complications (CAD - s/p AMI, CHF, DR, CKD, PN). Pt. previously saw Dr. Kassie, but last visit with me 4 months ago. His sister is also my pt.: David Lane.  He is here with his daughter.  Interim history: No increased urination, blurry vision, nausea, chest pain.  Reviewed HbA1c: Lab Results  Component Value Date   HGBA1C 9.1 (H) 01/11/2024   HGBA1C 8.9 (H) 10/04/2023   HGBA1C 8.6 (A) 06/10/2023   HGBA1C 8.4 (A) 02/03/2023   HGBA1C 8.9 (A) 10/06/2022   HGBA1C 9.4 (H) 04/29/2022   HGBA1C 8.9 (H) 01/07/2022   HGBA1C 8.8 (H) 10/09/2021   HGBA1C 9.4 (H) 07/09/2021   HGBA1C 8.2 (A) 11/20/2020   Previously on: - Lantus  70 >> 60 units in am - Novolog  5-10 units 3x a day, before meals (vials) >> 8-12 units 3x a day, 15 min before meals (actually using only 10 units) He tried Metformin  >> fatigue and weakness.  Now on: - Lantus  60 units midday >> in am >> 30 units twice a day - NovoLog  10-14 units 3x a day, 15 min before meals >>   >> OFF >> restarted: 5 units before a snack or very small meal 8 units before a smaller meal >> not using 10 units before a regular meal 12 units before a larger meal - Novolog  sliding scale: - 150-175: + 1 unit  - 176-200: + 2 units  - 201-225: + 3 units  - 226-250: + 4 units   Pt checks his sugars >4x a day and they are -forgot his receiver! - am: 120-200, HI - Throughout the day, they are fluctuating between 60s and HI, but he is unable to give more details about the blood sugars.  Prev.:  Prev.:   Lowest sugar was 50s >> 60 >> 60; he has hypoglycemia awareness at 100.  Highest sugar was 541 >> HI >> .SABRASABRA 400s >> HI.  Glucometer: Accu-Chek/One Touch ultra  - + Mild CKD, last  BUN/creatinine:  Lab Results  Component Value Date   BUN 20 10/04/2023   BUN 26 (H) 10/11/2022   CREATININE 1.18 10/04/2023   CREATININE 1.35 10/11/2022   Lab Results  Component Value Date   MICRALBCREAT 12 10/06/2023   MICRALBCREAT 5.1 08/03/2010   MICRALBCREAT 9.8 06/27/2009  On Lisinopril  5 mg  -+ HL; last set of lipids: Lab Results  Component Value Date   CHOL 93 10/04/2023   HDL 30.70 (L) 10/04/2023   LDLCALC 47 10/04/2023   TRIG 79.0 10/04/2023   CHOLHDL 3 10/04/2023  On Crestor  40 mg daily.  - last eye exam was in 2021-2022. + DR - had to have IO injections. Had cataract surgery in the last year.  - no numbness and tingling in his feet.  Last foot exam 06/10/2023.  He also has a history of HTN, GERD, anxiety, onychomycosis. He has a h/o pancreatitis.  ROS: + see HPI Past Medical History:  Diagnosis Date   CAD (coronary artery disease)    last cath by [facility] DR.  Mihai Croitoru showing  some  disease involving LCX and small size of Diag    CHF exacerbation, due to diastolic dysfunction 07/17/2012   Chronic systolic CHF (congestive heart failure), NYHA class 1 (  HCC) 07/17/2012   GERD (gastroesophageal reflux disease)    Hepatic lesion 02/04/11   Hyperlipemia    Hypertension    Ischemic cardiomyopathy    EF 35-40%   Liver hemangioma    NSTEMI (non-ST elevated myocardial infarction) (HCC) 11/21/2009   Pancreatitis 2000's   Shortness of breath    Type II diabetes mellitus (HCC)    Past Surgical History:  Procedure Laterality Date   AMPUTATION Left 01/14/2018   Procedure: TRANSMETATARSAL AMPUTATION;  Surgeon: Harden Jerona GAILS, MD;  Location: [facility] OR;  Service: Orthopedics;  Laterality: Left;   BIOPSY  04/26/2019   Procedure: BIOPSY;  Surgeon: Wilhelmenia Aloha Raddle., MD;  Location: [facility];  Service: Gastroenterology;;   CARDIAC CATHETERIZATION  2011   minimal disease, medical management   COLONOSCOPY WITH PROPOFOL  N/A 04/26/2019   Procedure: COLONOSCOPY WITH  PROPOFOL ;  Surgeon: Wilhelmenia Aloha Raddle., MD;  Location: [facility];  Service: Gastroenterology;  Laterality: N/A;   CORONARY ANGIOPLASTY WITH STENT PLACEMENT  07/14/2012   successful PCI & stenting of mid LAD & PDA off the dominant CX   CORONARY/GRAFT ACUTE MI REVASCULARIZATION N/A 12/20/2017   Procedure: Coronary/Graft Acute MI Revascularization;  Surgeon: Swaziland, Peter M, MD;  Location: [facility] INVASIVE CV LAB;  Service: Cardiovascular;  Laterality: N/A;   ENTEROSCOPY N/A 04/25/2019   Procedure: ENTEROSCOPY;  Surgeon: Wilhelmenia Aloha Raddle., MD;  Location: [facility];  Service: Gastroenterology;  Laterality: N/A;   ESOPHAGOGASTRODUODENOSCOPY Left 04/24/2019   Procedure: ESOPHAGOGASTRODUODENOSCOPY (EGD);  Surgeon: San Sandor GAILS, DO;  Location: [facility];  Service: Gastroenterology;  Laterality: Left;   HEMOSTASIS CLIP PLACEMENT  04/26/2019   Procedure: HEMOSTASIS CLIP PLACEMENT;  Surgeon: Wilhelmenia Aloha Raddle., MD;  Location: [facility];  Service: Gastroenterology;;   HOT HEMOSTASIS N/A 04/26/2019   Procedure: HOT HEMOSTASIS (ARGON PLASMA COAGULATION/BICAP);  Surgeon: Wilhelmenia Aloha Raddle., MD;  Location: [facility];  Service: Gastroenterology;  Laterality: N/A;   INCISION AND DRAINAGE ABSCESS ANAL  1970's   LEFT HEART CATH AND CORONARY ANGIOGRAPHY N/A 12/20/2017   Procedure: LEFT HEART CATH AND CORONARY ANGIOGRAPHY;  Surgeon: Swaziland, Peter M, MD;  Location: [facility] INVASIVE CV LAB;  Service: Cardiovascular;  Laterality: N/A;   LEFT HEART CATHETERIZATION WITH CORONARY ANGIOGRAM N/A 07/14/2012   Procedure: LEFT HEART CATHETERIZATION WITH CORONARY ANGIOGRAM;  Surgeon: Jerel Balding, MD;  Location: [facility] CATH LAB;  Service: Cardiovascular;  Laterality: N/A;   PERCUTANEOUS CORONARY STENT INTERVENTION (PCI-S) Right 07/14/2012   Procedure: PERCUTANEOUS CORONARY STENT INTERVENTION (PCI-S);  Surgeon: Jerel Balding, MD;  Location: [facility] CATH LAB;  Service: Cardiovascular;  Laterality: Right;    POLYPECTOMY  04/26/2019   Procedure: POLYPECTOMY;  Surgeon: Mansouraty, Aloha Raddle., MD;  Location: [facility];  Service: Gastroenterology;;   SUBMUCOSAL TATTOO INJECTION  04/25/2019   Procedure: SUBMUCOSAL TATTOO INJECTION;  Surgeon: Wilhelmenia Aloha Raddle., MD;  Location: [facility];  Service: Gastroenterology;;   Social History   Socioeconomic History   Marital status: Single    Spouse name: Not on file   Number of children: 0   Years of education: Not on file   Highest education level: Not on file  Occupational History   Occupation: retired    Associate Professor: [employer]  Tobacco Use   Smoking status: Never   Smokeless tobacco: Never  Vaping Use   Vaping status: Never Used  Substance and Sexual Activity   Alcohol  use: No   Drug use: No   Sexual activity: Not Currently  Other Topics Concern   Not on file  Social History  Narrative   Not on file   Social Drivers of Health   Financial Resource Strain: Low Risk  (12/23/2023)   Overall Financial Resource Strain (CARDIA)    Difficulty of Paying Living Expenses: Not hard at all  Food Insecurity: No Food Insecurity (12/23/2023)   Hunger Vital Sign    Worried About Running Out of Food in the Last Year: Never true    Ran Out of Food in the Last Year: Never true  Transportation Needs: No Transportation Needs (12/23/2023)   PRAPARE - Administrator, Civil Service (Medical): No    Lack of Transportation (Non-Medical): No  Physical Activity: Inactive (12/23/2023)   Exercise Vital Sign    Days of Exercise per Week: 0 days    Minutes of Exercise per Session: 0 min  Stress: No Stress Concern Present (10/04/2022)   [employer] of Occupational Health - Occupational Stress Questionnaire    Feeling of Stress : Not at all  Social Connections: Unknown (12/23/2023)   Social Connection and Isolation Panel    Frequency of Communication with Friends and Family: Three times a week    Frequency of Social Gatherings with Friends and  Family: Twice a week    Attends Religious Services: Never    Database administrator or Organizations: No    Attends Banker Meetings: Never    Marital Status: Not on file  Intimate Partner Violence: Not At Risk (12/23/2023)   Humiliation, Afraid, Rape, and Kick questionnaire    Fear of Current or Ex-Partner: No    Emotionally Abused: No    Physically Abused: No    Sexually Abused: No   Current Outpatient Medications on File Prior to Visit  Medication Sig Dispense Refill   Accu-Chek FastClix Lancets MISC 1 Units by Does not apply route daily. 100 each 2   Accu-Chek Softclix Lancets lancets 1 each by Other route as directed. Use as instructed 100 each 2   aspirin  81 MG tablet Take 1 tablet (81 mg total) by mouth daily. 30 tablet    Cholecalciferol (VITAMIN D3) 50 MCG (2000 UT) TABS Take 1 tablet by mouth daily.     Continuous Blood Gluc Receiver (FREESTYLE LIBRE 2 READER) DEVI 1 each by Does not apply route daily. 1 each 3   Continuous Glucose Sensor (FREESTYLE LIBRE 2 SENSOR) MISC INJECT 1 SENSOR SUBCUTANEOUSLY EVERY 14 DAYS 6 each 0   escitalopram  (LEXAPRO ) 10 MG tablet Take 1 tablet (10 mg total) by mouth daily. 90 tablet 3   hydrALAZINE  (APRESOLINE ) 10 MG tablet Take 1 tablet (10 mg total) by mouth every 8 (eight) hours as needed (systolic blood pressure > 160.). 15 tablet 2   insulin  aspart (NOVOLOG ) cartridge - NovoLog  15 min before a meal 10 units before a smaller meal, 12 units before a regular meal, 14 units before a larger meal - Add the following Novolog  sliding scale: for glucose of 150-175: + 1 unit, 176-200: + 2 units, 201-225: + 3 units, >225 + 4 units 15 mL 11   Insulin  Pen Needle (B-D UF III MINI PEN NEEDLES) 31G X 5 MM MISC USE TO INJECT INSULIN  4x DAILY 400 each 3   LANTUS  SOLOSTAR 100 UNIT/ML Solostar Pen INJECT 60 UNITS SUBCUTANEOUSLY ONCE DAILY 45 mL 0   lipase/protease/amylase (CREON ) 36000 UNITS CPEP capsule TAKE 2 CAPSULES BY MOUTH THREE TIMES DAILY WITH  MEALS.  MAY ALSO TAKE 1 CAPSULE AS NEEDED WITH  SNACKS. 240 capsule 3   lisinopril  (ZESTRIL )  10 MG tablet Take 0.5 tablets (5 mg total) by mouth daily. May take 1 tablet (10 mg) on days when BP > 150 systolic. 90 tablet 3   metoprolol  succinate (TOPROL -XL) 25 MG 24 hr tablet Take 1 tablet by mouth once daily 90 tablet 3   Multiple Vitamins-Minerals (CENTRUM SILVER ULTRA MENS PO) Take 1 tablet by mouth daily.      ONETOUCH ULTRA TEST test strip USE 1 STRIP TO CHECK GLUCOSE 4 TIMES DAILY 150 each 0   pantoprazole  (PROTONIX ) 40 MG tablet Take 1 tablet by mouth once daily 90 tablet 3   rosuvastatin  (CRESTOR ) 40 MG tablet TAKE 1 TABLET BY MOUTH AT BEDTIME 90 tablet 3   No current facility-administered medications on file prior to visit.   Allergies  Allergen Reactions   Lipitor [Atorvastatin ] Other (See Comments)    Muscle weakness   Metformin  And Related Diarrhea    Severe diarrhea   Family History  Problem Relation Age of Onset   Diabetes Mother    Diabetes Paternal Grandmother    Diabetes Maternal Grandfather    Colon cancer Neg Hx    PE: BP 130/70   Pulse 75   Ht 6' (1.829 m)   Wt 226 lb (102.5 kg)   SpO2 97%   BMI 30.65 kg/m  Wt Readings from Last 3 Encounters:  05/03/24 226 lb (102.5 kg)  02/03/24 225 lb 12.8 oz (102.4 kg)  01/11/24 214 lb (97.1 kg)   Constitutional: overweight, in NAD Eyes:  EOMI, no exophthalmos ENT: no neck masses, no cervical lymphadenopathy Cardiovascular: RRR, No MRG Respiratory: CTA B Musculoskeletal: no deformities except for left transmetatarsal amputation  Skin:no rashes Neurological: no tremor with outstretched hands Diabetic Foot Exam - Simple   Simple Foot Form Diabetic Foot exam was performed with the following findings: Yes 05/03/2024  1:49 PM  Visual Inspection No deformities, no ulcerations, no other skin breakdown bilaterally: Yes Sensation Testing See comments: Yes Pulse Check See comments: Yes Comments + TMA L foot +  onychodystrophy L foot Decreased sensation to monofilament Decreased pulses    ASSESSMENT: 1. 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 HL  PLAN:  1. Patient with longstanding, uncontrolled, insulin -dependent diabetes, on basal/bolus insulin  regimen, with suboptimal control.  At last visit, HbA1c was higher, at 9.1 %.  Sugars appears to be fluctuating above the target range but they were fluctuating between 60s and 300s.  Upon questioning, he felt that his NovoLog  insulin  doses were too high so instead of decreasing the doses he stopped them completely.  We discussed that he should not use NovoLog  doses on an all or nothing basis, but adjust them based on blood sugars behavior and the size of the meals.  We reviewed a few of his meals and discussed about examples of how much NovoLog  he needed to take for these.  I also advised him that if he had to inject after meal, to only take half of the dose. -I previously recommended to try to vary the injection sites more as he was injecting Lantus  in the relatively narrow area on his abdomen.   -At today's visit, unfortunately, he forgot his sensor receiver so we were not able to review his CGM tracings.  Also, he does mention that his blood sugars are fluctuating, between 60s and  HI, but unfortunately he cannot give me more details about the sugars.  His daughter mentions that he still has difficulty deciding what doses of NovoLog  2 units before certain meals.  They are interested in referral to nutrition.  Referral placed today.  For now, I advised him to continue the same regimen.  I did emphasize the need to take NovoLog  15 minutes before a meal, rather than closer to the meal. - I suggested to:  Patient Instructions  Please continue: - Lantus   30 units in am and 30 units at night - NovoLog  15 min before a meal 5 units before a snack or very small meal 8 units before a smaller meal  10 units before a regular meal 12 units before a larger meal - Add the following Novolog  sliding scale: - 150-175: + 1 unit  - 176-200: + 2 units  - 201-225: + 3 units  - >225 + 4 units   I will refer you to nutritionist.  Please return for another visit in 3-4 months.   - we checked his HbA1c: 9.3% (higher) - advised to check sugars at different times of the day - 4x a day, rotating check times - advised for yearly eye exams >> he is not up-to-date - return to clinic in 3-4 months  2. HL - Latest lipid panel was reviewed from 09/2023: HDL low, otherwise fractions at goal: Lab Results  Component Value Date   CHOL 93 10/04/2023   HDL 30.70 (L) 10/04/2023   LDLCALC 47 10/04/2023   TRIG 79.0 10/04/2023   CHOLHDL 3 10/04/2023  -He continues on Crestor  40 mg daily without side effects  Lela Fendt, MD PhD [facility] Endocrinology

## 2024-05-03 NOTE — Patient Instructions (Addendum)
 Please continue: - Lantus  30 units in am and 30 units at night - NovoLog  15 min before a meal 5 units before a snack or very small meal 8 units before a smaller meal  10 units before a regular meal 12 units before a larger meal - Add the following Novolog  sliding scale: - 150-175: + 1 unit  - 176-200: + 2 units  - 201-225: + 3 units  - >225 + 4 units   I will refer you to nutritionist.  Please return for another visit in 3-4 months.

## 2024-07-12 ENCOUNTER — Other Ambulatory Visit: Payer: Self-pay | Admitting: Family Medicine

## 2024-07-12 ENCOUNTER — Other Ambulatory Visit: Payer: Self-pay | Admitting: Internal Medicine

## 2024-07-12 DIAGNOSIS — I10 Essential (primary) hypertension: Secondary | ICD-10-CM

## 2024-09-04 ENCOUNTER — Ambulatory Visit: Admitting: Internal Medicine

## 2024-09-04 ENCOUNTER — Telehealth: Payer: Self-pay | Admitting: Internal Medicine

## 2024-09-04 MED ORDER — FREESTYLE LIBRE 2 READER DEVI: 1.0000 | Freq: Every day | 3 refills | Status: AC

## 2024-09-04 NOTE — Telephone Encounter (Signed)
 Requested Prescriptions   Signed Prescriptions Disp Refills   Continuous Glucose Receiver (FREESTYLE LIBRE 2 READER) DEVI 1 each 3    Sig: 1 each by Does not apply route daily.    Authorizing Provider: TRIXIE FILE    Ordering User: CLEOTILDE REMAK S   Refill sent and pt has been notified.

## 2024-09-04 NOTE — Telephone Encounter (Signed)
 MEDICATION: Continuous Blood Gluc Receiver (FREESTYLE LIBRE 2 READER) DEVI   PHARMACY:  [facility] - [place], [place] - [address] (Ph: [phone])   HAS THE PATIENT CONTACTED THEIR PHARMACY?  Yes  LAST REFILL:  @@LASTREFILL @  IS THIS A 90 DAY SUPPLY : Yes  IS PATIENT OUT OF MEDICATION: Yes  IF NOT; HOW MUCH IS LEFT:   LAST APPOINTMENT DATE: @8 /03/2024  NEXT APPOINTMENT DATE:@1 /10/2024  DO WE HAVE YOUR PERMISSION TO LEAVE A DETAILED MESSAGE?: Yes  OTHER COMMENTS: Patient's reader not working (a metal part fell out of charging port)   **Let patient know to contact pharmacy at the end of the day to make sure medication is ready. **  ** Please notify patient to allow 48-72 hours to process**  **Encourage patient to contact the pharmacy for refills or they can request refills through [facility]**

## 2024-09-05 ENCOUNTER — Other Ambulatory Visit: Payer: Self-pay | Admitting: Internal Medicine

## 2024-09-05 DIAGNOSIS — E089 Diabetes mellitus due to underlying condition without complications: Secondary | ICD-10-CM

## 2024-09-06 ENCOUNTER — Telehealth: Payer: Self-pay

## 2024-09-06 ENCOUNTER — Other Ambulatory Visit: Payer: Self-pay | Admitting: Internal Medicine

## 2024-09-06 DIAGNOSIS — E089 Diabetes mellitus due to underlying condition without complications: Secondary | ICD-10-CM

## 2024-09-06 NOTE — Telephone Encounter (Signed)
 Refill request for lantus  solostar from [facility].

## 2024-09-28 ENCOUNTER — Ambulatory Visit: Admitting: Internal Medicine

## 2024-09-28 ENCOUNTER — Encounter: Payer: Self-pay | Admitting: Internal Medicine

## 2024-09-28 VITALS — BP 120/60 | HR 92 | Ht 72.0 in | Wt 222.8 lb

## 2024-09-28 DIAGNOSIS — E11319 Type 2 diabetes mellitus with unspecified diabetic retinopathy without macular edema: Secondary | ICD-10-CM

## 2024-09-28 DIAGNOSIS — E1159 Type 2 diabetes mellitus with other circulatory complications: Secondary | ICD-10-CM

## 2024-09-28 DIAGNOSIS — E1142 Type 2 diabetes mellitus with diabetic polyneuropathy: Secondary | ICD-10-CM | POA: Diagnosis not present

## 2024-09-28 DIAGNOSIS — E7849 Other hyperlipidemia: Secondary | ICD-10-CM | POA: Diagnosis not present

## 2024-09-28 DIAGNOSIS — E1165 Type 2 diabetes mellitus with hyperglycemia: Secondary | ICD-10-CM | POA: Diagnosis not present

## 2024-09-28 DIAGNOSIS — E1140 Type 2 diabetes mellitus with diabetic neuropathy, unspecified: Secondary | ICD-10-CM

## 2024-09-28 DIAGNOSIS — Z794 Long term (current) use of insulin: Secondary | ICD-10-CM | POA: Diagnosis not present

## 2024-09-28 LAB — POCT GLYCOSYLATED HEMOGLOBIN (HGB A1C): Hemoglobin A1C: 9.7 % — AB (ref 4.0–5.6)

## 2024-09-28 NOTE — Progress Notes (Signed)
 Patient ID: David Lane, male   DOB: 12-Aug-1946, 79 y.o.   MRN: [id number]  HPI: David Lane is a 79 y.o.-year-old male, returning for follow-up for DM2, dx in 1988 during an episode of gallstone pancreatitis, insulin -dependent since 1990, uncontrolled, with complications (CAD - s/p AMI, CHF, DR, CKD, PN). Pt. previously saw Dr. Kassie, but last visit with me 4 months ago. His sister is also my pt.: David Lane.  He is here with his daughter.  Interim history: No increased urination, blurry vision, nausea, chest pain. He continues to have weakness.   Reviewed HbA1c: Lab Results  Component Value Date   HGBA1C 9.3 (A) 05/03/2024   HGBA1C 9.1 (H) 01/11/2024   HGBA1C 8.9 (H) 10/04/2023   HGBA1C 8.6 (A) 06/10/2023   HGBA1C 8.4 (A) 02/03/2023   HGBA1C 8.9 (A) 10/06/2022   HGBA1C 9.4 (H) 04/29/2022   HGBA1C 8.9 (H) 01/07/2022   HGBA1C 8.8 (H) 10/09/2021   HGBA1C 9.4 (H) 07/09/2021   Previously on: - Lantus  70 >> 60 units in am - Novolog  5-10 units 3x a day, before meals (vials) >> 8-12 units 3x a day, 15 min before meals (actually using only 10 units) He tried Metformin  >> fatigue and weakness.  Now on: - Lantus  60 units midday >> in am >> 30 units twice a day - NovoLog  10-14 units 3x a day >> 15 min before a meal 5 units before a snack or very small meal 8 units before a smaller meal  10 units before a regular meal 12 units before a larger meal - Add the following Novolog  sliding scale: - 150-175: + 1 unit  - 176-200: + 2 units  - 201-225: + 3 units  - >225 + 4 units   Pt checks his sugars >4x a day and they are:  Prev.: - am: 120-200, HI - Throughout the day, they were reportedly fluctuating between 60s and HI, but he was unable to give more details about the blood sugars.  Prev.:  Prev.:   Lowest sugar was 50s >> 60 >> 60; he has hypoglycemia awareness at 100.  Highest sugar was 541 >> HI >> .SABRASABRA 400s >> HI.  Glucometer: Accu-Chek/One Touch ultra  - + Mild  CKD, last BUN/creatinine:  Lab Results  Component Value Date   BUN 20 10/04/2023   BUN 26 (H) 10/11/2022   CREATININE 1.18 10/04/2023   CREATININE 1.35 10/11/2022   Lab Results  Component Value Date   MICRALBCREAT 12 10/06/2023   MICRALBCREAT 5.1 08/03/2010   MICRALBCREAT 9.8 06/27/2009  On Lisinopril  5 mg  -+ HL; last set of lipids: Lab Results  Component Value Date   CHOL 93 10/04/2023   HDL 30.70 (L) 10/04/2023   LDLCALC 47 10/04/2023   TRIG 79.0 10/04/2023   CHOLHDL 3 10/04/2023  On Crestor  40 mg daily.  - last eye exam was in 2021-2022. + DR - had to have IO injections. Had cataract surgery in the last year.  - no numbness and tingling in his feet.  Last foot exam 05/03/2024.  He also has a history of HTN, GERD, anxiety, onychomycosis. He has a h/o pancreatitis.  ROS: + see HPI Past Medical History:  Diagnosis Date   CAD (coronary artery disease)    last cath by [facility] DR.  Mihai Croitoru showing  some  disease involving LCX and small size of Diag    CHF exacerbation, due to diastolic dysfunction 07/17/2012   Chronic systolic CHF (congestive heart failure), NYHA  class 1 (HCC) 07/17/2012   GERD (gastroesophageal reflux disease)    Hepatic lesion 02/04/11   Hyperlipemia    Hypertension    Ischemic cardiomyopathy    EF 35-40%   Liver hemangioma    NSTEMI (non-ST elevated myocardial infarction) (HCC) 11/21/2009   Pancreatitis 2000's   Shortness of breath    Type II diabetes mellitus (HCC)    Past Surgical History:  Procedure Laterality Date   AMPUTATION Left 01/14/2018   Procedure: TRANSMETATARSAL AMPUTATION;  Surgeon: Harden David GAILS, MD;  Location: [facility] OR;  Service: Orthopedics;  Laterality: Left;   BIOPSY  04/26/2019   Procedure: BIOPSY;  Surgeon: Wilhelmenia Aloha Raddle., MD;  Location: [facility]OSCOPY;  Service: Gastroenterology;;   CARDIAC CATHETERIZATION  2011   minimal disease, medical management   COLONOSCOPY WITH PROPOFOL  N/A 04/26/2019   Procedure:  COLONOSCOPY WITH PROPOFOL ;  Surgeon: Mansouraty, Aloha Raddle., MD;  Location: [facility];  Service: Gastroenterology;  Laterality: N/A;   CORONARY ANGIOPLASTY WITH STENT PLACEMENT  07/14/2012   successful PCI & stenting of mid LAD & PDA off the dominant CX   CORONARY/GRAFT ACUTE MI REVASCULARIZATION N/A 12/20/2017   Procedure: Coronary/Graft Acute MI Revascularization;  Surgeon: Jordan, Peter M, MD;  Location: [facility] INVASIVE CV LAB;  Service: Cardiovascular;  Laterality: N/A;   ENTEROSCOPY N/A 04/25/2019   Procedure: ENTEROSCOPY;  Surgeon: Wilhelmenia Aloha Raddle., MD;  Location: [facility];  Service: Gastroenterology;  Laterality: N/A;   ESOPHAGOGASTRODUODENOSCOPY Left 04/24/2019   Procedure: ESOPHAGOGASTRODUODENOSCOPY (EGD);  Surgeon: San Sandor GAILS, DO;  Location: [facility];  Service: Gastroenterology;  Laterality: Left;   HEMOSTASIS CLIP PLACEMENT  04/26/2019   Procedure: HEMOSTASIS CLIP PLACEMENT;  Surgeon: Wilhelmenia Aloha Raddle., MD;  Location: [facility];  Service: Gastroenterology;;   HOT HEMOSTASIS N/A 04/26/2019   Procedure: HOT HEMOSTASIS (ARGON PLASMA COAGULATION/BICAP);  Surgeon: Wilhelmenia Aloha Raddle., MD;  Location: [facility];  Service: Gastroenterology;  Laterality: N/A;   INCISION AND DRAINAGE ABSCESS ANAL  1970's   LEFT HEART CATH AND CORONARY ANGIOGRAPHY N/A 12/20/2017   Procedure: LEFT HEART CATH AND CORONARY ANGIOGRAPHY;  Surgeon: Jordan, Peter M, MD;  Location: [facility] INVASIVE CV LAB;  Service: Cardiovascular;  Laterality: N/A;   LEFT HEART CATHETERIZATION WITH CORONARY ANGIOGRAM N/A 07/14/2012   Procedure: LEFT HEART CATHETERIZATION WITH CORONARY ANGIOGRAM;  Surgeon: Jerel Balding, MD;  Location: [facility] CATH LAB;  Service: Cardiovascular;  Laterality: N/A;   PERCUTANEOUS CORONARY STENT INTERVENTION (PCI-S) Right 07/14/2012   Procedure: PERCUTANEOUS CORONARY STENT INTERVENTION (PCI-S);  Surgeon: Jerel Balding, MD;  Location: [facility] CATH LAB;  Service: Cardiovascular;   Laterality: Right;   POLYPECTOMY  04/26/2019   Procedure: POLYPECTOMY;  Surgeon: Mansouraty, Aloha Raddle., MD;  Location: [facility];  Service: Gastroenterology;;   SUBMUCOSAL TATTOO INJECTION  04/25/2019   Procedure: SUBMUCOSAL TATTOO INJECTION;  Surgeon: Wilhelmenia Aloha Raddle., MD;  Location: [facility];  Service: Gastroenterology;;   Social History   Socioeconomic History   Marital status: Single    Spouse name: Not on file   Number of children: 0   Years of education: Not on file   Highest education level: Not on file  Occupational History   Occupation: retired    Associate Professor: [employer]  Tobacco Use   Smoking status: Never   Smokeless tobacco: Never  Vaping Use   Vaping status: Never Used  Substance and Sexual Activity   Alcohol  use: No   Drug use: No   Sexual activity: Not Currently  Other Topics Concern   Not on file  Social History Narrative   Not on file   Social Drivers of Health   Tobacco Use: Low Risk (05/03/2024)   Patient History    Smoking Tobacco Use: Never    Smokeless Tobacco Use: Never    Passive Exposure: Not on file  Financial Resource Strain: Low Risk (12/23/2023)   Overall Financial Resource Strain (CARDIA)    Difficulty of Paying Living Expenses: Not hard at all  Food Insecurity: No Food Insecurity (12/23/2023)   Hunger Vital Sign    Worried About Running Out of Food in the Last Year: Never true    Ran Out of Food in the Last Year: Never true  Transportation Needs: No Transportation Needs (12/23/2023)   PRAPARE - Administrator, Civil Service (Medical): No    Lack of Transportation (Non-Medical): No  Physical Activity: Inactive (12/23/2023)   Exercise Vital Sign    Days of Exercise per Week: 0 days    Minutes of Exercise per Session: 0 min  Stress: No Stress Concern Present (10/04/2022)   [employer] of Occupational Health - Occupational Stress Questionnaire    Feeling of Stress : Not at all  Social Connections: Unknown  (12/23/2023)   Social Connection and Isolation Panel    Frequency of Communication with Friends and Family: Three times a week    Frequency of Social Gatherings with Friends and Family: Twice a week    Attends Religious Services: Never    Database Administrator or Organizations: No    Attends Banker Meetings: Never    Marital Status: Not on file  Intimate Partner Violence: Not At Risk (12/23/2023)   Humiliation, Afraid, Rape, and Kick questionnaire    Fear of Current or Ex-Partner: No    Emotionally Abused: No    Physically Abused: No    Sexually Abused: No  Depression (PHQ2-9): Low Risk (12/23/2023)   Depression (PHQ2-9)    PHQ-2 Score: 0  Alcohol  Screen: Low Risk (12/23/2023)   Alcohol  Screen    Last Alcohol  Screening Score (AUDIT): 0  Housing: Unknown (12/23/2023)   Housing Stability Vital Sign    Unable to Pay for Housing in the Last Year: No    Number of Times Moved in the Last Year: Not on file    Homeless in the Last Year: No  Utilities: Not At Risk (12/23/2023)   AHC Utilities    Threatened with loss of utilities: No  Health Literacy: Adequate Health Literacy (12/23/2023)   B1300 Health Literacy    Frequency of need for help with medical instructions: Never   Current Outpatient Medications on File Prior to Visit  Medication Sig Dispense Refill   Accu-Chek FastClix Lancets MISC 1 Units by Does not apply route daily. 100 each 2   Accu-Chek Softclix Lancets lancets 1 each by Other route as directed. Use as instructed 100 each 2   aspirin  81 MG tablet Take 1 tablet (81 mg total) by mouth daily. 30 tablet    BD PEN NEEDLE MINI ULTRAFINE 31G X 5 MM MISC USE TO INJECT INSULIN  4 TIMES DAILY 400 each 0   Cholecalciferol (VITAMIN D3) 50 MCG (2000 UT) TABS Take 1 tablet by mouth daily.     Continuous Glucose Receiver (FREESTYLE LIBRE 2 READER) DEVI 1 each by Does not apply route daily. 1 each 3   Continuous Glucose Sensor (FREESTYLE LIBRE 2 SENSOR) MISC INJECT 1 SENSOR  SUBCUTANEOUSLY EVERY 14 DAYS 6 each 0   escitalopram  (LEXAPRO ) 10 MG tablet Take  1 tablet (10 mg total) by mouth daily. 90 tablet 3   hydrALAZINE  (APRESOLINE ) 10 MG tablet Take 1 tablet (10 mg total) by mouth every 8 (eight) hours as needed (systolic blood pressure > 160.). 15 tablet 2   insulin  aspart (NOVOLOG ) cartridge - NovoLog  15 min before a meal 10 units before a smaller meal, 12 units before a regular meal, 14 units before a larger meal - Add the following Novolog  sliding scale: for glucose of 150-175: + 1 unit, 176-200: + 2 units, 201-225: + 3 units, >225 + 4 units 15 mL 11   LANTUS  SOLOSTAR 100 UNIT/ML Solostar Pen INJECT 60 UNITS SUBCUTANEOUSLY ONCE DAILY 45 mL 1   lipase/protease/amylase (CREON ) 36000 UNITS CPEP capsule TAKE 2 CAPSULES BY MOUTH THREE TIMES DAILY WITH MEALS.  MAY ALSO TAKE 1 CAPSULE AS NEEDED WITH  SNACKS. 240 capsule 3   lisinopril  (ZESTRIL ) 10 MG tablet TAKE 1/2 (ONE-HALF) TABLET BY MOUTH ONCE DAILY, MAY TAKE 1 TABLET ON DAYS WHEN BP IS GREATER THAN 150 SYSTOLIC 90 tablet 3   metoprolol  succinate (TOPROL -XL) 25 MG 24 hr tablet Take 1 tablet by mouth once daily 90 tablet 3   Multiple Vitamins-Minerals (CENTRUM SILVER ULTRA MENS PO) Take 1 tablet by mouth daily.      ONETOUCH ULTRA TEST test strip USE 1 STRIP TO CHECK GLUCOSE 4 TIMES DAILY 150 each 0   pantoprazole  (PROTONIX ) 40 MG tablet Take 1 tablet by mouth once daily 90 tablet 3   rosuvastatin  (CRESTOR ) 40 MG tablet TAKE 1 TABLET BY MOUTH AT BEDTIME 90 tablet 3   No current facility-administered medications on file prior to visit.   Allergies  Allergen Reactions   Lipitor [Atorvastatin ] Other (See Comments)    Muscle weakness   Metformin  And Related Diarrhea    Severe diarrhea   Family History  Problem Relation Age of Onset   Diabetes Mother    Diabetes Paternal Grandmother    Diabetes Maternal Grandfather    Colon cancer Neg Hx    PE: BP 120/60   Pulse 92   Ht 6' (1.829 m)   Wt 222 lb 12.8 oz (101.1  kg)   SpO2 96%   BMI 30.22 kg/m  Wt Readings from Last 3 Encounters:  09/28/24 222 lb 12.8 oz (101.1 kg)  05/03/24 226 lb (102.5 kg)  02/03/24 225 lb 12.8 oz (102.4 kg)   Constitutional: overweight, in NAD Eyes:  EOMI, no exophthalmos ENT: no neck masses, no cervical lymphadenopathy Cardiovascular: RRR, No MRG Respiratory: CTA B Musculoskeletal: no deformities except for left transmetatarsal amputation  Skin:no rashes Neurological: no tremor with outstretched hands  ASSESSMENT: 1. 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 HL  PLAN:  1. Patient with longstanding, uncontrolled, insulin -dependent diabetes on basal/bolus insulin  regimen, with still suboptimal control.  At last visit, HbA1c was even higher, at 9.3%, increased from 9.1%.  He forgot his sensor receiver at last visit so we were not able to review his CGM tracings.  He did mention that his blood sugars were fluctuating between 60s and HI, but unfortunately, he was not able to give me more details and we were not able to change his regimen.  His daughter mentions that he still has difficulty deciding what doses of NovoLog  to take before certain meals.  They were interested  in a referral to nutrition, which I placed.  He did not have this appointment yet... I did emphasize to take NovoLog  15 minutes before a meal, rather than closer to the meal.  I previously also recommended to try to vary the injection sites more as he was injecting Lantus  in a relatively narrow area on his abdomen. CGM interpretation: -At today's visit, we reviewed his CGM downloads: It appears that 9% of values are in target range (goal >70%), while 91% are higher than 180 (goal <25%), and 0% are lower than 70 (goal <4%).  The calculated average blood  sugar is 276.  The projected HbA1c for the next 3 months (GMI) is 9.9%. -Reviewing the CGM trends, sugars appear to be very weighted, above target.  Upon questioning, he is afraid to take his mealtime insulin  and is taking less than recommended due to the fear of lows.  Per review of his CGM tracings, he does not have lows almost at all.  His sugars trended down today before the appointment as the daughter mentions that he did not feel good and was nervous before the appointment.  At today's visit we discussed that he needs to try to inject more insulin  before meals and I increased his NovoLog  doses.  We can continue the same dose of Lantus  for now.  We did discuss about his sliding scale and given several examples about how to use it.  We also discussed that he can repeat the sliding scale if needed and if the sugars do not improve 2 hours after correction. -He did not see the nutritionist as he went back and read the materials that he had at home.  Will not place the referral again for now. - I suggested to:  Patient Instructions  Please continue: - Lantus  30 units in am and 30 units at night  Take: - NovoLog  15 min before a meal 5 units before a snack or very small meal 8 units before a smaller meal  12 units before a regular meal 14 units before a larger meal - Add the following Novolog  sliding scale: - 150-175: + 1 unit  - 176-200: + 2 units  - 201-225: + 3 units  - >225 + 4 units   Please return for another visit in 3-4 months.   - we checked his HbA1c: 9.7% (higher) - advised to check sugars at different times of the day - 4x a day, rotating check times - advised for yearly eye exams >> he is UTD - return to clinic in 3-4 months  2. HL Latest lipid panel was reviewed from 09/2023: Fractions at goal except for low HDL: Lab Results  Component Value Date   CHOL 93 10/04/2023   HDL 30.70 (L) 10/04/2023   LDLCALC 47 10/04/2023   TRIG 79.0 10/04/2023   CHOLHDL 3 10/04/2023  -He  continues on Crestor  40 mg daily without side effects  Lela Fendt, MD PhD [facility]

## 2024-09-28 NOTE — Patient Instructions (Addendum)
 Please continue: - Lantus  30 units in am and 30 units at night  Take: - NovoLog  15 min before a meal 5 units before a snack or very small meal 8 units before a smaller meal  12 units before a regular meal 14 units before a larger meal - Add the following Novolog  sliding scale: - 150-175: + 1 unit  - 176-200: + 2 units  - 201-225: + 3 units  - >225 + 4 units   Please return for another visit in 3-4 months.

## 2024-10-16 ENCOUNTER — Other Ambulatory Visit: Payer: Self-pay | Admitting: Internal Medicine

## 2024-10-30 ENCOUNTER — Other Ambulatory Visit: Payer: Self-pay | Admitting: Family Medicine

## 2024-10-30 DIAGNOSIS — F419 Anxiety disorder, unspecified: Secondary | ICD-10-CM

## 2024-12-24 ENCOUNTER — Ambulatory Visit

## 2024-12-25 ENCOUNTER — Ambulatory Visit: Admitting: Internal Medicine
